# Patient Record
Sex: Female | Born: 1935
Health system: Southern US, Community
[De-identification: ages and names within clinical notes are randomized; demographics above are authoritative.]

## PROBLEM LIST (undated history)

## (undated) DIAGNOSIS — K56609 Unspecified intestinal obstruction, unspecified as to partial versus complete obstruction: Secondary | ICD-10-CM

## (undated) DIAGNOSIS — G8929 Other chronic pain: Secondary | ICD-10-CM

## (undated) DIAGNOSIS — R51 Headache: Secondary | ICD-10-CM

## (undated) DIAGNOSIS — I639 Cerebral infarction, unspecified: Secondary | ICD-10-CM

## (undated) DIAGNOSIS — B029 Zoster without complications: Secondary | ICD-10-CM

## (undated) DIAGNOSIS — Z9289 Personal history of other medical treatment: Secondary | ICD-10-CM

## (undated) DIAGNOSIS — E119 Type 2 diabetes mellitus without complications: Secondary | ICD-10-CM

## (undated) DIAGNOSIS — I10 Essential (primary) hypertension: Secondary | ICD-10-CM

## (undated) DIAGNOSIS — M545 Low back pain, unspecified: Secondary | ICD-10-CM

## (undated) DIAGNOSIS — F32A Depression, unspecified: Secondary | ICD-10-CM

## (undated) DIAGNOSIS — IMO0002 Reserved for concepts with insufficient information to code with codable children: Secondary | ICD-10-CM

## (undated) DIAGNOSIS — M1990 Unspecified osteoarthritis, unspecified site: Secondary | ICD-10-CM

## (undated) DIAGNOSIS — I2510 Atherosclerotic heart disease of native coronary artery without angina pectoris: Secondary | ICD-10-CM

## (undated) DIAGNOSIS — T4145XA Adverse effect of unspecified anesthetic, initial encounter: Secondary | ICD-10-CM

## (undated) DIAGNOSIS — F329 Major depressive disorder, single episode, unspecified: Secondary | ICD-10-CM

## (undated) DIAGNOSIS — H3530 Unspecified macular degeneration: Secondary | ICD-10-CM

## (undated) DIAGNOSIS — F419 Anxiety disorder, unspecified: Secondary | ICD-10-CM

## (undated) DIAGNOSIS — D649 Anemia, unspecified: Secondary | ICD-10-CM

## (undated) DIAGNOSIS — K219 Gastro-esophageal reflux disease without esophagitis: Secondary | ICD-10-CM

## (undated) DIAGNOSIS — R519 Headache, unspecified: Secondary | ICD-10-CM

## (undated) DIAGNOSIS — K222 Esophageal obstruction: Secondary | ICD-10-CM

## (undated) DIAGNOSIS — E7800 Pure hypercholesterolemia, unspecified: Secondary | ICD-10-CM

## (undated) HISTORY — PX: EYE SURGERY: SHX253

## (undated) HISTORY — PX: SQUAMOUS CELL CARCINOMA EXCISION: SHX2433

## (undated) HISTORY — PX: COLON SURGERY: SHX602

## (undated) HISTORY — PX: CHOLECYSTECTOMY: SHX55

## (undated) HISTORY — PX: BACK SURGERY: SHX140

## (undated) HISTORY — PX: ABDOMINAL HYSTERECTOMY: SHX81

## (undated) HISTORY — PX: CATARACT EXTRACTION, BILATERAL: SHX1313

## (undated) HISTORY — PX: APPENDECTOMY: SHX54

## (undated) HISTORY — PX: ESOPHAGOGASTRODUODENOSCOPY (EGD) WITH ESOPHAGEAL DILATION: SHX5812

## (undated) HISTORY — PX: COLONOSCOPY: SHX174

## (undated) HISTORY — PX: TOTAL KNEE ARTHROPLASTY: SHX125

## (undated) HISTORY — PX: FRACTURE SURGERY: SHX138

## (undated) HISTORY — PX: JOINT REPLACEMENT: SHX530

## (undated) HISTORY — DX: Type 2 diabetes mellitus without complications: E11.9

---

## 1965-08-05 DIAGNOSIS — I639 Cerebral infarction, unspecified: Secondary | ICD-10-CM

## 1965-08-05 HISTORY — DX: Cerebral infarction, unspecified: I63.9

## 2001-07-06 ENCOUNTER — Ambulatory Visit (HOSPITAL_COMMUNITY): Admission: RE | Admit: 2001-07-06 | Discharge: 2001-07-06 | Payer: Self-pay | Admitting: Gastroenterology

## 2001-08-06 ENCOUNTER — Encounter: Payer: Self-pay | Admitting: Orthopedic Surgery

## 2001-08-14 ENCOUNTER — Encounter: Payer: Self-pay | Admitting: Orthopedic Surgery

## 2001-08-14 ENCOUNTER — Inpatient Hospital Stay (HOSPITAL_COMMUNITY): Admission: RE | Admit: 2001-08-14 | Discharge: 2001-08-18 | Payer: Self-pay | Admitting: Orthopedic Surgery

## 2004-01-23 ENCOUNTER — Encounter: Admission: RE | Admit: 2004-01-23 | Discharge: 2004-01-23 | Payer: Self-pay | Admitting: Family Medicine

## 2008-12-16 ENCOUNTER — Inpatient Hospital Stay (HOSPITAL_COMMUNITY): Admission: RE | Admit: 2008-12-16 | Discharge: 2008-12-21 | Payer: Self-pay | Admitting: Orthopedic Surgery

## 2009-08-05 DIAGNOSIS — T8859XA Other complications of anesthesia, initial encounter: Secondary | ICD-10-CM

## 2009-08-05 HISTORY — PX: SMALL INTESTINE SURGERY: SHX150

## 2009-08-05 HISTORY — DX: Other complications of anesthesia, initial encounter: T88.59XA

## 2010-11-13 LAB — BASIC METABOLIC PANEL
BUN: 17 mg/dL (ref 6–23)
BUN: 13 mg/dL (ref 6–23)
BUN: 23 mg/dL (ref 6–23)
CO2: 26 mEq/L (ref 19–32)
CO2: 24 mEq/L (ref 19–32)
Calcium: 8.1 mg/dL — ABNORMAL LOW (ref 8.4–10.5)
Calcium: 8.3 mg/dL — ABNORMAL LOW (ref 8.4–10.5)
Calcium: 8.8 mg/dL (ref 8.4–10.5)
Chloride: 101 mEq/L (ref 96–112)
Chloride: 102 mEq/L (ref 96–112)
Chloride: 104 mEq/L (ref 96–112)
Creatinine, Ser: 0.99 mg/dL (ref 0.4–1.2)
Creatinine, Ser: 1 mg/dL (ref 0.4–1.2)
Creatinine, Ser: 0.93 mg/dL (ref 0.4–1.2)
Creatinine, Ser: 1.28 mg/dL — ABNORMAL HIGH (ref 0.4–1.2)
GFR calc Af Amer: 56 mL/min — ABNORMAL LOW (ref >60)
GFR calc Af Amer: >60 mL/min (ref >60)
GFR calc Af Amer: >60 mL/min (ref >60)
GFR calc non Af Amer: 46 mL/min — ABNORMAL LOW (ref >60)
GFR calc non Af Amer: 54 mL/min — ABNORMAL LOW (ref >60)
GFR calc non Af Amer: 59 mL/min — ABNORMAL LOW (ref >60)
Glucose, Bld: 124 mg/dL — ABNORMAL HIGH (ref 70–99)
Glucose, Bld: 124 mg/dL — ABNORMAL HIGH (ref 70–99)
Potassium: 4.2 mEq/L (ref 3.5–5.1)
Sodium: 135 mEq/L (ref 135–145)
Sodium: 136 mEq/L (ref 135–145)

## 2010-11-13 LAB — PROTIME-INR
INR: 3.4 — ABNORMAL HIGH (ref 0.00–1.49)
INR: 5 — ABNORMAL HIGH (ref 0.00–1.49)
INR: 1 (ref 0.00–1.49)
Prothrombin Time: 31.7 seconds — ABNORMAL HIGH (ref 11.6–15.2)
Prothrombin Time: 51 seconds — ABNORMAL HIGH (ref 11.6–15.2)
Prothrombin Time: 15.5 seconds — ABNORMAL HIGH (ref 11.6–15.2)

## 2010-11-13 LAB — TYPE AND SCREEN
ABO/RH(D): O POS
ABO/RH(D): O POS
Antibody Screen: NEGATIVE
Antibody Screen: NEGATIVE

## 2010-11-13 LAB — URINALYSIS, ROUTINE W REFLEX MICROSCOPIC
Glucose, UA: NEGATIVE mg/dL
Hgb urine dipstick: NEGATIVE
Specific Gravity, Urine: 1.027 (ref 1.005–1.030)
Urobilinogen, UA: 0.2 mg/dL (ref 0.0–1.0)

## 2010-11-13 LAB — CBC
HCT: 24.4 % — ABNORMAL LOW (ref 36.0–46.0)
HCT: 34.5 % — ABNORMAL LOW (ref 36.0–46.0)
Hemoglobin: 8.8 g/dL — ABNORMAL LOW (ref 12.0–15.0)
MCHC: 33.5 g/dL (ref 30.0–36.0)
MCV: 89.6 fL (ref 78.0–100.0)
Platelets: 300 10*3/uL (ref 150–400)
Platelets: 273 10*3/uL (ref 150–400)
Platelets: 387 10*3/uL (ref 150–400)
RBC: 2.56 MIL/uL — ABNORMAL LOW (ref 3.87–5.11)
RBC: 3.85 MIL/uL — ABNORMAL LOW (ref 3.87–5.11)
RDW: 15 % (ref 11.5–15.5)
RDW: 14.9 % (ref 11.5–15.5)
WBC: 13.9 10*3/uL — ABNORMAL HIGH (ref 4.0–10.5)
WBC: 7.6 10*3/uL (ref 4.0–10.5)

## 2010-11-13 LAB — ABO/RH: ABO/RH(D): O POS

## 2010-11-13 LAB — DIFFERENTIAL
Basophils Relative: 0 % (ref 0–1)
Eosinophils Absolute: 0.2 10*3/uL (ref 0.0–0.7)
Eosinophils Relative: 2 % (ref 0–5)
Monocytes Relative: 8 % (ref 3–12)
Neutrophils Relative %: 62 % (ref 43–77)

## 2010-11-13 LAB — PREPARE RBC (CROSSMATCH)

## 2010-11-13 LAB — HEMOGLOBIN AND HEMATOCRIT, BLOOD: HCT: 29.4 % — ABNORMAL LOW (ref 36.0–46.0)

## 2010-12-18 NOTE — Discharge Summary (Signed)
NAMEMerci, [name]                ACCOUNT NO.:  [id number]   MEDICAL RECORD NO.:  [id number]          PATIENT TYPE:  INP   LOCATION:  5023                         FACILITY:  [facility]   PHYSICIAN:  Doran Heater. Veverly Fells, M.D. DATE OF BIRTH:  March 04, 1932   DATE OF ADMISSION:  12/16/2008  DATE OF DISCHARGE:                               DISCHARGE SUMMARY   ADMISSION DIAGNOSIS:  Right knee end-stage osteoarthritis.   DISCHARGE DIAGNOSES:  Right knee end-stage osteoarthritis, blood loss  anemia improved after transfusion.   BRIEF HISTORY:  The patient is a 77 year old female with worsening right  knee pain secondary to osteoarthritis.  The patient has elected to have  a right total knee arthroplasty by Dr. Esmond Plants.   PROCEDURE:  The patient had a right total knee replacement done by Dr.  Esmond Plants on Dec 16, 2008.  Assistant was [employer], PA-C.  Estimated blood loss was minimal.  She did have general anesthesia.  No  complications.   HOSPITAL COURSE:  The patient admitted on Dec 16, 2008, for the above-  stated procedure which she tolerated well.  After adequate time in  postanesthesia care unit, she was transferred up to 5000.  On postop day  1, the patient complained of moderate pain in the right knee but was  able to work with Physical Therapy gently.  She did have some mild drop  in her H and H.  On postop day 3, she dropped all the way down to 7.8.  She was transfused 2 units of packed red blood cells.  The patient  states she was feeling a little bit better.  On postop day 4, we wanted  to go ahead and have one more day to work on strengthening and also  physical therapy, and this patient was discharged home on postop day 5.  Wound was healing well.  No signs of drainage or erythema.  Neurovascularly, she was intact.  Basically and overall,  she is doing  fairly well.   DISCHARGE PLAN:  The patient will be discharged home on Dec 21, 2008.  The patient has allergies to  SULFA.   DISCHARGE MEDICATIONS:  1. Celexa 20 mg p.o. daily.  2. Robaxin 500 mg p.o. q.6 hours.  3. Procardia 120 mg p.o. daily.  4. Benicar 40 mg p.o. daily.  5. Protonix 80 mg p.o. daily.  6. Zocor 20 mg daily.  7. Coumadin per pharmacy protocol.  8. Xanax 0.25 mg p.o. daily p.r.n.  9. Flexeril 10 mg p.o. q.i.d. p.r.n.  10.Norco 5/325 one to two tabs q.4-6 hours p.r.n. pain.  11.Percocet 5/325 one to two tabs q.4-6 hours p.r.n. pain.   FOLLOWUP:  The patient will follow back up with Dr. Esmond Plants in 2  weeks.  Her condition is stable.  Her diet is regular.       Thomas B. Doren Custard, P.A.      Doran Heater. Veverly Fells, M.D.  Electronically Signed    TBD/MEDQ  D:  12/20/2008  T:  12/21/2008  Job:  [id number]

## 2010-12-18 NOTE — Op Note (Signed)
NAMEPanayiota, [name]                ACCOUNT NO.:  [id number]   MEDICAL RECORD NO.:  [id number]          PATIENT TYPE:  INP   LOCATION:  5023                         FACILITY:  [facility]   PHYSICIAN:  Doran Heater. Veverly Fells, M.D. DATE OF BIRTH:  13-Aug-1931   DATE OF PROCEDURE:  12/16/2008  DATE OF DISCHARGE:                               OPERATIVE REPORT   PREOPERATIVE DIAGNOSIS:  Right knee end-stage arthritis.   POSTOPERATIVE DIAGNOSIS:  Right knee end-stage arthritis.   PROCEDURE PERFORMED:  Right total knee replacement using DePuy Sigma  rotating platform prosthesis.   ATTENDING SURGEON:  Doran Heater. Veverly Fells, MD   ASSISTANT:  Abbott Pao. Dixon, PA.   ANESTHESIA:  General anesthesia was used.   ESTIMATED BLOOD LOSS:  Minimal.   FLUID REPLACEMENT:  1200 mL crystalloid.   URINE OUTPUT:  500 mL.   TOURNIQUET TIME:  1 hour 20 minutes at 300 mmHg.   INSTRUMENT COUNTS:  Correct.   COMPLICATIONS:  There were no complications.   Perioperative antibiotics were given.   INDICATIONS:  The patient is a 77 year old female with a history of  worsening right knee pain secondary to arthritis.  The patient has had  progressive pain despite conservative management.  She has had a prior  left total knee and has done well with that.  She presents now for  operative treatment for her arthritic right knee.  Informed consent was  obtained.   DESCRIPTION OF PROCEDURE:  After an adequate level of anesthesia was  achieved, the patient was positioned supine on the operating table.  Right leg was correctly identified and nonsterile tourniquet was placed  on the right proximal thigh.  The right leg was sterilely prepped and  draped in the usual manner.  We went ahead and exsanguinated the limb  using an Esmarch bandage, elevated the tourniquet to 300 mmHg.  Longitudinal midline incision was created with knee in flexion,  dissection down through subcutaneous tissues.  A medial parapatellar  arthrotomy was  created.  The patella was everted.  We then entered the  distal femur using a step-cut drill.  Distal femoral resections set at  11 mm because of the slight flexion contracture with 5 degrees right  setting.  We then performed 4-in-1 anterior, posterior, and chamfer cuts  after sizing the femur to a 2.5.  We then subluxed the tibia forward,  divided ACL, PCL and meniscal tissues.  We then cut tibia at 90 degrees  perpendicular to long axis of tibia with minimal posterior slope.  We  checked her gaps, which were symmetric at 10 mm.  It was a little bit  tight getting the pin in, despite the medial release and posterior  capsule release.  So we went ahead and resected 2 more millimeters of  tibia, and we were symmetric then at 12.5 both in flexion and extension.  At this point, we went ahead and prepared the tibia, sizing to a 2.5 and  performing modular drill and keel punch and then also performing a box  cut in the femur, placing the trial components in place and reducing  with 10 insert with nice soft tissue balancing.  We then resurfaced the  patella starting at 22 thickness and going down to 40 mm of thickness  for the size 32 patella.  We drilled our lugs for the patella, placed a  patellar button on and then arranged the knee with good soft tissue  balancing patellar tracking.  At this point, we removed all trial  components.  We thoroughly pulse irrigated the knee, plugged the distal  femur with available bone and then cemented the components into place  using DePuy SmartSet cement.  Once the cement was hardened, we removed  excess cement using 1/4 inch curved osteotome.  We then trialed again  with 10, tried the 12.5 was too tight, the 10 was selected.  We removed  the trial component, placed real component in place and then went ahead  and closed the knee with knee in flexion using #1 Vicryl suture  interrupted figure-of-eight for parapatellar arthrotomy closure,  followed by  layered closure with 0 and 2-0 Vicryl for the subcutaneous  tissues and running 4-0 Monocryl for skin.  Steri-Strips applied,  followed by sterile dressing.  The patient tolerated the surgery well.      Doran Heater. Veverly Fells, M.D.  Electronically Signed     SRN/MEDQ  D:  12/16/2008  T:  12/17/2008  Job:  [id number]

## 2010-12-18 NOTE — H&P (Signed)
NAME:  [name], [name]                ACCOUNT NO.:  [id number]   MEDICAL RECORD NO.:  [id number]          PATIENT TYPE:  INP   LOCATION:                               FACILITY:  [facility]   PHYSICIAN:  Doran Heater. Veverly Fells, M.D. DATE OF BIRTH:  03-05-1932   DATE OF ADMISSION:  12/20/2008  DATE OF DISCHARGE:                              HISTORY & PHYSICAL   CHIEF COMPLAINTS:  Right knee pain.   HISTORY OF PRESENT ILLNESS:  The patient is a 77 year old female with  worsening right knee pain, has been refractory to conservative treatment  secondary to osteoarthritis.  The patient is elected to have a right  total knee arthroplasty by Dr. Esmond Plants.   PAST MEDICAL HISTORY:  1. Hypertension.  2. GERD.   FAMILY MEDICAL HISTORY:  CVA.   SOCIAL HISTORY:  Patient of [name], does not smoke or use  alcohol.   DRUG ALLERGIES:  SULFA.   CURRENT MEDICATIONS:  1. Procardia 120 mg p.o. daily.  2. Benicar 40 mg p.o. daily.   REVIEW OF SYSTEMS:  Pain with ambulation.   PHYSICAL EXAMINATION:  VITAL SIGNS:  Pulse 70, respirations 16, blood  pressure 130/68.  GENERAL:  The patient is a healthy-appearing 77 year old female, in no  acute distress, pleasant mood and affect, alert and oriented x3.  HEAD AND NECK:  Cranial nerves II through XII grossly intact.  Neck  shows full range of motion without any tenderness.  CHEST:  Active breath sounds bilaterally.  No wheeze, rhonchi, or rales  HEART:  Regular rate and rhythm.  No murmur.  ABDOMEN:  Nontender, nondistended with active bowel sounds.  EXTREMITIES:  Moderate tenderness in the right knee with range of  motion.  SKIN:  No edema or no rashes.   X-rays show right end-stage osteoarthritis.   IMPRESSION:  Right knee end-stage osteoarthritis.   PLAN OF ACTION:  Right total knee arthroplasty by Dr. Esmond Plants.      Thomas B. Doren Custard, P.A.      Doran Heater. Veverly Fells, M.D.  Electronically Signed    TBD/MEDQ  D:  12/08/2008  T:   12/09/2008  Job:  [id number]

## 2010-12-21 NOTE — Procedures (Signed)
[place]  Patient:    JACQULYNE, GLADUE Visit Number: [id number] MRN: [id number]          Service Type: END Location: ENDO Attending Physician:  Sherrin Daisy Dictated by:   Joyice Faster. Oletta Lamas, M.D. Proc. Date: 07/06/01 Admit Date:  07/06/2001   CC:         Micheline Chapman, M.D.  [phone]   Procedure Report  PROCEDURE PERFORMED:  Colonoscopy.  ENDOSCOPIST:  Joyice Faster. Oletta Lamas, M.D.  MEDICATIONS USED:  Fentanyl 170 mcg, Versed 2 mg IV.  INSTRUMENT:  Olympus pediatric video colonoscope.  INDICATIONS:  Colon cancer evaluation in a woman with a strong family history of colon cancer.  Her mother died of colon cancer.  DESCRIPTION OF PROCEDURE:  The procedure had been explained to the patient and consent obtained.  With the patient in the left lateral decubitus position, the Olympus pediatric video colonoscope was inserted and advanced under direct visualization.  The prep was excellent and we were able to advance to the cecum using abdominal pressure and position changes.  The ileocecal valve and appendiceal orifice were seen.  The scope was withdrawn.  The cecum, ascending colon, hepatic flexure, transverse colon, splenic flexure, descending and sigmoid colon were seen well.  No polyps were seen.  Extensive diverticular disease in the sigmoid colon.  Scope withdrawn, patient tolerated the procedure well.  Maintained on low flow oxygen and pulse oximeter throughout the procedure.  ASSESSMENT: 1. No evidence of colon polyps. 2. Extensive diverticular disease.  PLAN:  Due to her strong family history, will recommend repeating this in five years and recommend yearly  hemoccults. Dictated by:   Joyice Faster. Oletta Lamas, M.D. Attending Physician:  Sherrin Daisy DD:  07/06/01 TD:  07/06/01 Job: [id number]

## 2010-12-21 NOTE — Op Note (Signed)
[facility]  Patient:    Joy, Erickson Visit Number: [id number] MRN: [id number]          Service Type: SUR Location: 4W 0470 01 Attending Physician:  Augustin Schooling. Dictated by:   Esmond Plants, M.D. Proc. Date: 08/14/01 Admit Date:  08/14/2001   CC:         Metta Clines. Supple, M.D.   Operative Report  PREOPERATIVE DIAGNOSIS:  Left knee osteoarthritis.  POSTOPERATIVE DIAGNOSIS:  Left knee osteoarthritis.  PROCEDURE:  Left total knee arthroplasty.  SURGEON:  Esmond Plants, M.D.  FIRST ASSISTANT:  Metta Clines. Supple, M.D.  ANESTHESIA:  Spinal plus MAC.  ESTIMATED BLOOD LOSS:  Minimal.  TOURNIQUET TIME:  108 minutes.  FLUID REPLACEMENT:  1200 cc crystalloid.  URINE OUTPUT:  250 cc.  INSTRUMENT COUNT:  Correct.  COMPLICATIONS:  None.  Perioperative antibiotics were given.  INDICATIONS FOR PROCEDURE:  The patient is a 77 year old female who presents complaining of worsening left knee pain. The patient has medial compartment osteoarthrosis on standing x-rays. The patient recently notes dramatic increase in her pain to where she is having pain at night as well as pain at rest. She has had good temporary relief with intra-articular injection of local anesthetic and cortisone; however, the last injection did not work very long at all. The patients modified her activities as best as she can and now desires a total knee replacement. Informed consent was obtained.  DESCRIPTION OF PROCEDURE:  After an adequate level of spinal anesthesia plus MAC was achieved and 1 gm of Ancef was given preoperatively, the patient was positioned supine on the operating room table. The right nonoperative leg was secured to the table and padded appropriately. The left leg had a nonsterile tourniquet placed on the thigh and then was prepped and draped in its entirety in the usual sterile fashion. After exsanguination of the limb and elevation of the  tourniquet to 275 mmHg, a midline incision was created utilizing a 10 blade scalpel, dissection was carried sharply down through the subcutaneous tissues. A median parapatellar arthrotomy was then performed using a 10 blade scalpel. The patella was then everted and the femoral canal was then opened and a femoral jig was placed, 10 mm of distal femur removed. The femur was then sized to size 7, anterior posterior cuts as well as chamfer cuts were then performed using an oscillating saw. ACL and menisci removed from the tibia as well as the PCL. The tibia was then subluxed forward and an internal intermedullary alignment guide was used for the tibia, 4 mm of bone was taken off of the affected side. Care was taken towards ensuring appropriate alignment as verified by external alignment rods. The tibia was then cut using an oscillating saw. The patella was resurfaced by removing 10 mm of patella.  At this point, the box cut jig was then placed on the femur to provide for a posterior stabilized component and the keel was punched in the tibia once rotation was ensured after trialing and again checking with the alignment rod. The knee was thoroughly irrigated. A size 7 femur, size 7 tibial tray were then selected and cemented into place. A 10 mm trial insert was then placed and the knee was reduced and held during the curing process of the cement. This was an [employer]. In addition to the patella, a 10 mm medial offset patellar button was then cemented into place and held with a patellar clamp. Once the cement on  the back table was thoroughly cured, the trial component of the knee was taken through a trial range of motion. There was noted to be no tilt or subluxation of the patella with no fingers. At this point, the trial 10 mm insert was removed and a 10 mm posterior stabilized polyethylene insert was placed. The knee was noted to be very stable and had full extension flexion to 130  degrees on the table. The knee was thoroughly irrigated. A drain was placed through the suprapatellar pouch and out the lateral thigh. The medial parapatellar arthrotomy was closed using interrupted figure-of-eight #1 PDS suture. Zero and 2-0 Vicryl was used to close the subcutaneous tissues and running 4-0 monocryl was used to close the skin. Steri-Strips were applied followed by a sterile dressing and a knee immobilizer. The patient tolerated the procedure well and was taken to PACU in stable condition. Dictated by:   Esmond Plants, M.D. Attending Physician:  Esmond Plants R. DD:  08/15/01 TD:  08/16/01 Job: [id number]

## 2010-12-21 NOTE — Discharge Summary (Signed)
[facility]  Patient:    [name], [name] Visit Number: [id number] MRN: [id number]          Service Type: SUR Location: 4W 0470 01 Attending Physician:  Augustin Schooling. Dictated by:   Peter Congo, P.A. Admit Date:  08/14/2001 Discharge Date: 08/18/2001                             Discharge Summary  ADMISSION DIAGNOSES: 1. Osteoarthritis to the left knee. 2. Hypertension. 3. Remote history of cerebrovascular accident.  DISCHARGE DIAGNOSES: 1. Osteoarthritis, left knee, status post total knee arthroplasty. 2. Hypertension. 3. Remote history of cerebrovascular accident.  PROCEDURE:  On August 14, 2001, the patient underwent a left total knee arthroplasty.  Surgeon was [employer]. Veverly Fells, M.D., assistant Metta Clines. Supple, M.D.  COMPLICATIONS:  None.  CONSULTATIONS:  Physical medicine and rehabilitation.  HISTORY OF PRESENT ILLNESS:  This is an 77 year old female with a long history of left knee pain for about six to seven years.  She had knee scoped about three years ago and has had continued pain that has been progressive.  She has had a series of cortisone injections, Synvisc injections, and various anti-inflammatories without relief.  She was also noted prior to the surgery to be having nighttime pain, also pain and stiffness with attempts activity. Because of significant findings on her radiographic exam and interference with her activities of daily living, she elected to undergo surgical intervention.  HOSPITAL COURSE:  The patient had the above-stated surgery on August 14, 9526, without complications.  While in the operating room, a Hemovac drain was placed inside the incision.  This was discontinued on postop day #1 without difficulty.  The incisions were dressed in a sterile fashion while in the operating room.  These were clean, dry, and intact on postop day #1.  The dressings were changed on postop day #2 and daily thereafter.   She was free of any erythema or drainage from the incision site.  She was placed on Coumadin for DVT prophylaxis, which was monitored and dosed by the pharmacy throughout her hospital stay.  A regular diet was advanced postoperatively.  She tolerated p.o. intake well throughout her hospital stay and did have bowel movements.  PCA morphine was used initially.  This was weaned off by postop day #2, and she used p.o. Percocet throughout the remainder of her hospital stay.  Her home medications of Procardia, Zestoretic, and Vioxx were resumed postoperatively.  She utilized SCDs and knee-high TED hose for DVT prophylaxis as well.  Occupational and physical therapy worked with the patient for ambulation.  She was weightbearing as tolerated to the left lower extremity. She was further progressed with her flexion-extension so that on postop day #3 a CPM was utilized.  Hemoglobin and hematocrit were checked daily for three days and found to be stable.  She was placed on Trinsicon.  A rehab consult was obtained; however, she did progress well in physical therapy and was felt to be comfortable for home health PT.  On August 18, 2001, she was felt to be medically and orthopedically stable for discharge.  LABORATORY DATA:  H&H on August 06, 2001, were within normal limits.  On August 16, 2001, hemoglobin 8.0, hematocrit 22.9.  On August 17, 2001, hemoglobin was up to 8.6, hematocrit 24.9.  Coagulation studies on August 06, 2001, were within normal limits.  On August 16, 2001, PT was 19.3, INR 1.9.  On August 18, 2000, PT was 21.9, INR 2.4.  BMET from August 06, 2001, showed some mild hypokalemia at 3.4, otherwise normal.  On August 15, 2001, BMET was within normal limits other than glucose of 142.  Calcium was 8.1.  Liver function studies on August 06, 2001 were within normal limits.  UA negative on August 06, 2001.  Blood type on August 14, 2001 is O positive.  CARDIOLOGY:  Tracings from  August 06, 2001, revealed normal sinus rhythm, were confirmed by Dr. Alla German.  RADIOLOGY:  Two-view chest x-ray from August 06, 2001, revealed normal chest x-ray.  Two-view knee x-ray from August 14, 2000, was status post left total knee replacement without complicating feature.  CONDITION ON DISCHARGE:  Improved.  DISCHARGE PLANS AND MEDICATIONS:  The patient was discharged home with home health PT and OT, and Coumadin therapy with [facility].  She was to utilize her CPM for about four hours a day.  Given prescriptions for Percocet, Robaxin, Trinsicon, and Coumadin per pharmacy.  Resume her home medications except aspirin.  Follow up with Dr. Veverly Fells in two weeks from the surgery date.  Weightbearing as tolerated to the left lower extremity.  Daily dressing changes.  Shower postop day #5.  Low-sodium diet.  Call should she have any questions or concerns prior to her follow-up appointment with Dr. Veverly Fells. Dictated by:   Peter Congo, P.A. Attending Physician:  Esmond Plants R. DD:  08/31/01 TD:  09/01/01 Job: [id number]

## 2010-12-21 NOTE — H&P (Signed)
[facility]  Patient:    [name] Visit Number: [id number] MRN: [id number]          Service Type: Attending:  Doran Heater. Veverly Fells, M.D. Dictated by:   Duncan Dull Troncale, P.A.C. Adm. Date:  08/14/01                           History and Physical  DATE OF BIRTH:  07-08-1932. SOCIAL SECURITY NO. [id number].  CHIEF COMPLAINT:  Left knee pain.  HISTORY OF PRESENT ILLNESS:  [name] is a 77 year old female who presents with severe left knee pain that she has had for the last six to seven years. She had previously undergone a knee scope about three years ago without much relief.  She has tried a series of cortisone injections, Synvisc injections, and tried various anti-inflammatories without relief.  She is now having nighttime pain, also having pain and stiffness with any attempts at activity. Because of the significant findings on her radiographic exams and interference with her activities of daily living, she has elected to undergo surgical intervention.  REVIEW OF SYSTEMS:  She denies any recent fevers or chills.  No diplopia, blurred vision, or headaches.  No rhinorrhea, sore throat, or earache.  No chest pain, shortness of breath, or cough.  No abdominal pain, nausea, vomiting, diarrhea, or constipation.  No melena or bright red blood per rectum.  No urinary frequency, dysuria, or hematuria.  No numbness or tingling in her extremities.  PAST MEDICAL HISTORY:  Significant for hypertension, also a stroke with the birth of her daughter in 61 with no residual effects.  She had a clear colonoscopy in December 2002.  Only other medical problem is she had anemia and a thyroid problem when young, but this is resolved.  No history of diabetes, seizures, cancer, heart, lung, or kidney disease.  PAST SURGICAL HISTORY:  Left knee arthroscopy in 1999, hysterectomy in 1970, and appendectomy in 1955.  ALLERGIES:  SULFA.  MEDICATIONS:  Procardia  LA 60 mg q.d., Zestoretic (lisinopril/ hydrochlorothiazide) 25 mg q.d., Vioxx, and vitamin.  FAMILY HISTORY:  Father had a stroke.  Mother had heart disease and colon cancer.  SOCIAL HISTORY:  She is a nonsmoker.  No alcohol use.  She is retired.  She is married.  Her daughter lives on the same property as the patient.  She has two steps leading into her home and lives in a single-level home.  Dr. Jari Pigg is her medical doctor.  PHYSICAL EXAMINATION:  VITAL SIGNS:  She is afebrile.  Her pulse is 72, respiratory rate is 10, blood pressure 144/84.  GENERAL:  A well-developed, well-nourished female in no acute distress, although she walks with an antalgic gait.  HEENT:  Head is atraumatic and normocephalic.  Pupils are equal, round and reactive to light, and extraocular movements are grossly intact.  Oropharynx is clear without redness, exudate, or lesions.  NECK:  Supple with no cervical lymphadenopathy.  CHEST:  Clear to auscultation bilaterally with no wheezes or crackles.  CARDIAC:  Regular rate and rhythm with no murmurs, rubs, or gallops.  ABDOMEN:  Soft, nontender, nondistended, with no masses, no hepatosplenomegaly.  BREASTS, GENITOURINARY:  Not examined, as not pertinent to present illness.  EXTREMITIES:  Slight varus alignment of the left knee.  She is tender diffusely over the medial and lateral joint spaces.  Range of motion is 0-120 degrees.  Distal neurovascular exam is grossly intact.  DIAGNOSTIC  STUDIES:  Radiographic studies demonstrate severe medial compartment osteoarthritis of the left knee.  IMPRESSION: 1. Left knee osteoarthritis. 2. Hypertension. 3. Remote history of cerebrovascular accident.  PLAN:  Patient will be admitted to [facility] to undergo a left total knee arthroplasty by Dr. Veverly Fells on August 14, 2001.  Preoperative labs and signed surgical consents will be obtained.  All questions have been encouraged and answered for  the patient. Dictated by:   Duncan Dull Troncale, P.A.C. Attending:  Doran Heater. Veverly Fells, M.D. DD:  08/06/01 TD:  08/06/01 Job: [id number]

## 2011-08-06 DIAGNOSIS — IMO0002 Reserved for concepts with insufficient information to code with codable children: Secondary | ICD-10-CM

## 2011-08-06 HISTORY — DX: Reserved for concepts with insufficient information to code with codable children: IMO0002

## 2012-03-30 ENCOUNTER — Emergency Department (HOSPITAL_COMMUNITY): Payer: Medicare Other

## 2012-03-30 ENCOUNTER — Encounter (HOSPITAL_COMMUNITY): Payer: Self-pay | Admitting: Emergency Medicine

## 2012-03-30 ENCOUNTER — Inpatient Hospital Stay (HOSPITAL_COMMUNITY)
Admission: EM | Admit: 2012-03-30 | Discharge: 2012-04-08 | DRG: 480 | Disposition: A | Discharge status: Skilled Nursing Facility | Payer: Medicare Other | Attending: Family Medicine | Admitting: Family Medicine

## 2012-03-30 DIAGNOSIS — N390 Urinary tract infection, site not specified: Secondary | ICD-10-CM

## 2012-03-30 DIAGNOSIS — S7292XA Unspecified fracture of left femur, initial encounter for closed fracture: Secondary | ICD-10-CM | POA: Diagnosis present

## 2012-03-30 DIAGNOSIS — B964 Proteus (mirabilis) (morganii) as the cause of diseases classified elsewhere: Secondary | ICD-10-CM | POA: Diagnosis present

## 2012-03-30 DIAGNOSIS — W19XXXA Unspecified fall, initial encounter: Secondary | ICD-10-CM

## 2012-03-30 DIAGNOSIS — J189 Pneumonia, unspecified organism: Secondary | ICD-10-CM | POA: Diagnosis not present

## 2012-03-30 DIAGNOSIS — R509 Fever, unspecified: Secondary | ICD-10-CM | POA: Diagnosis not present

## 2012-03-30 DIAGNOSIS — Z96659 Presence of unspecified artificial knee joint: Secondary | ICD-10-CM

## 2012-03-30 DIAGNOSIS — W010XXA Fall on same level from slipping, tripping and stumbling without subsequent striking against object, initial encounter: Secondary | ICD-10-CM | POA: Diagnosis present

## 2012-03-30 DIAGNOSIS — D649 Anemia, unspecified: Secondary | ICD-10-CM

## 2012-03-30 DIAGNOSIS — Y92009 Unspecified place in unspecified non-institutional (private) residence as the place of occurrence of the external cause: Secondary | ICD-10-CM

## 2012-03-30 DIAGNOSIS — R339 Retention of urine, unspecified: Secondary | ICD-10-CM | POA: Diagnosis present

## 2012-03-30 DIAGNOSIS — N189 Chronic kidney disease, unspecified: Secondary | ICD-10-CM | POA: Diagnosis present

## 2012-03-30 DIAGNOSIS — D631 Anemia in chronic kidney disease: Secondary | ICD-10-CM | POA: Diagnosis present

## 2012-03-30 DIAGNOSIS — A498 Other bacterial infections of unspecified site: Secondary | ICD-10-CM | POA: Diagnosis present

## 2012-03-30 DIAGNOSIS — K219 Gastro-esophageal reflux disease without esophagitis: Secondary | ICD-10-CM | POA: Diagnosis present

## 2012-03-30 DIAGNOSIS — S7290XA Unspecified fracture of unspecified femur, initial encounter for closed fracture: Secondary | ICD-10-CM

## 2012-03-30 DIAGNOSIS — B0229 Other postherpetic nervous system involvement: Secondary | ICD-10-CM | POA: Diagnosis present

## 2012-03-30 DIAGNOSIS — Z8619 Personal history of other infectious and parasitic diseases: Secondary | ICD-10-CM | POA: Diagnosis not present

## 2012-03-30 DIAGNOSIS — S7291XA Unspecified fracture of right femur, initial encounter for closed fracture: Secondary | ICD-10-CM | POA: Diagnosis present

## 2012-03-30 DIAGNOSIS — D62 Acute posthemorrhagic anemia: Secondary | ICD-10-CM | POA: Diagnosis not present

## 2012-03-30 DIAGNOSIS — S72409A Unspecified fracture of lower end of unspecified femur, initial encounter for closed fracture: Principal | ICD-10-CM | POA: Diagnosis present

## 2012-03-30 DIAGNOSIS — I10 Essential (primary) hypertension: Secondary | ICD-10-CM | POA: Diagnosis present

## 2012-03-30 HISTORY — DX: Zoster without complications: B02.9

## 2012-03-30 HISTORY — DX: Unspecified intestinal obstruction, unspecified as to partial versus complete obstruction: K56.609

## 2012-03-30 HISTORY — DX: Essential (primary) hypertension: I10

## 2012-03-30 HISTORY — DX: Adverse effect of unspecified anesthetic, initial encounter: T41.45XA

## 2012-03-30 LAB — URINALYSIS, ROUTINE W REFLEX MICROSCOPIC
Bilirubin Urine: NEGATIVE
Glucose, UA: NEGATIVE mg/dL
Hgb urine dipstick: NEGATIVE
Ketones, ur: NEGATIVE mg/dL
Specific Gravity, Urine: 1.024 (ref 1.005–1.030)
pH: 5.5 (ref 5.0–8.0)

## 2012-03-30 LAB — CBC WITH DIFFERENTIAL/PLATELET
Eosinophils Relative: 0 % (ref 0–5)
HCT: 35.1 % — ABNORMAL LOW (ref 36.0–46.0)
Hemoglobin: 11.9 g/dL — ABNORMAL LOW (ref 12.0–15.0)
Lymphocytes Relative: 6 % — ABNORMAL LOW (ref 12–46)
MCHC: 33.9 g/dL (ref 30.0–36.0)
MCV: 88.6 fL (ref 78.0–100.0)
Monocytes Absolute: 0.5 10*3/uL (ref 0.1–1.0)
Monocytes Relative: 3 % (ref 3–12)
Neutro Abs: 16.3 10*3/uL — ABNORMAL HIGH (ref 1.7–7.7)
RDW: 13.5 % (ref 11.5–15.5)
WBC: 17.9 10*3/uL — ABNORMAL HIGH (ref 4.0–10.5)

## 2012-03-30 LAB — BASIC METABOLIC PANEL
BUN: 28 mg/dL — ABNORMAL HIGH (ref 6–23)
Chloride: 101 mEq/L (ref 96–112)
Creatinine, Ser: 0.88 mg/dL (ref 0.50–1.10)
Glucose, Bld: 161 mg/dL — ABNORMAL HIGH (ref 70–99)
Potassium: 4.4 mEq/L (ref 3.5–5.1)

## 2012-03-30 LAB — POCT I-STAT TROPONIN I

## 2012-03-30 MED ORDER — HYDROCODONE-ACETAMINOPHEN 5-325 MG PO TABS
1.0000 | ORAL_TABLET | ORAL | Status: DC | PRN
  Administered 2012-03-31: 2 via ORAL
  Filled 2012-03-30: qty 2

## 2012-03-30 MED ORDER — TIZANIDINE HCL 4 MG PO TABS
4.0000 mg | ORAL_TABLET | Freq: Three times a day (TID) | ORAL | Status: DC | PRN
  Filled 2012-03-30: qty 1

## 2012-03-30 MED ORDER — ENOXAPARIN SODIUM 40 MG/0.4ML ~~LOC~~ SOLN
40.0000 mg | Freq: Once | SUBCUTANEOUS | Status: DC
  Filled 2012-03-30: qty 0.4

## 2012-03-30 MED ORDER — ONDANSETRON HCL 4 MG/2ML IJ SOLN
INTRAMUSCULAR | Status: AC
  Administered 2012-03-30: 4 mg via INTRAVENOUS
  Filled 2012-03-30: qty 2

## 2012-03-30 MED ORDER — TEMAZEPAM 15 MG PO CAPS
30.0000 mg | ORAL_CAPSULE | Freq: Every evening | ORAL | Status: DC | PRN
  Administered 2012-03-31 – 2012-04-07 (×9): 30 mg via ORAL
  Filled 2012-03-30: qty 1
  Filled 2012-03-30 (×2): qty 2
  Filled 2012-03-30: qty 1
  Filled 2012-03-30 (×6): qty 2

## 2012-03-30 MED ORDER — ENOXAPARIN SODIUM 40 MG/0.4ML ~~LOC~~ SOLN
40.0000 mg | Freq: Every day | SUBCUTANEOUS | Status: DC
  Filled 2012-03-30: qty 0.4

## 2012-03-30 MED ORDER — CEFAZOLIN SODIUM-DEXTROSE 2-3 GM-% IV SOLR
2.0000 g | INTRAVENOUS | Status: AC
  Administered 2012-03-31: 2 g via INTRAVENOUS
  Filled 2012-03-30: qty 50

## 2012-03-30 MED ORDER — ONDANSETRON HCL 4 MG/2ML IJ SOLN
4.0000 mg | Freq: Four times a day (QID) | INTRAMUSCULAR | Status: DC | PRN
  Administered 2012-03-30 – 2012-03-31 (×2): 4 mg via INTRAVENOUS
  Filled 2012-03-30 (×2): qty 2

## 2012-03-30 MED ORDER — ACETAMINOPHEN 325 MG PO TABS: 650.0000 mg | ORAL_TABLET | Freq: Four times a day (QID) | ORAL | Status: DC | PRN

## 2012-03-30 MED ORDER — LOSARTAN POTASSIUM 50 MG PO TABS
100.0000 mg | ORAL_TABLET | Freq: Every day | ORAL | Status: DC
  Administered 2012-03-31 – 2012-04-08 (×9): 100 mg via ORAL
  Filled 2012-03-30 (×9): qty 2

## 2012-03-30 MED ORDER — ONDANSETRON HCL 4 MG PO TABS: 4.0000 mg | ORAL_TABLET | Freq: Four times a day (QID) | ORAL | Status: DC | PRN

## 2012-03-30 MED ORDER — CHLORHEXIDINE GLUCONATE 4 % EX LIQD
60.0000 mL | Freq: Once | CUTANEOUS | Status: AC
  Administered 2012-03-31: 4 via TOPICAL
  Filled 2012-03-30: qty 60

## 2012-03-30 MED ORDER — HYDROCHLOROTHIAZIDE 25 MG PO TABS
25.0000 mg | ORAL_TABLET | Freq: Every day | ORAL | Status: DC
Start: 2012-03-31 — End: 2012-04-08
  Administered 2012-03-31 – 2012-04-08 (×9): 25 mg via ORAL
  Filled 2012-03-30 (×9): qty 1

## 2012-03-30 MED ORDER — MORPHINE SULFATE 4 MG/ML IJ SOLN
INTRAMUSCULAR | Status: AC
  Administered 2012-03-30: 4 mg via INTRAVENOUS
  Filled 2012-03-30: qty 1

## 2012-03-30 MED ORDER — MORPHINE SULFATE 4 MG/ML IJ SOLN
3.0000 mg | INTRAMUSCULAR | Status: DC | PRN
  Administered 2012-03-30: 4 mg via INTRAVENOUS
  Filled 2012-03-30: qty 1

## 2012-03-30 MED ORDER — METHOCARBAMOL 500 MG PO TABS: 500.0000 mg | ORAL_TABLET | Freq: Three times a day (TID) | ORAL | Status: DC | PRN

## 2012-03-30 MED ORDER — ALUM & MAG HYDROXIDE-SIMETH 200-200-20 MG/5ML PO SUSP: 30.0000 mL | Freq: Four times a day (QID) | ORAL | Status: DC | PRN

## 2012-03-30 MED ORDER — HYDROMORPHONE HCL PF 2 MG/ML IJ SOLN: 2.0000 mg | Freq: Once | INTRAMUSCULAR | Status: DC

## 2012-03-30 MED ORDER — MORPHINE SULFATE 2 MG/ML IJ SOLN
2.0000 mg | INTRAMUSCULAR | Status: DC | PRN
  Administered 2012-03-31 (×2): 2 mg via INTRAVENOUS
  Filled 2012-03-30 (×2): qty 1

## 2012-03-30 MED ORDER — FENTANYL CITRATE 0.05 MG/ML IJ SOLN
INTRAMUSCULAR | Status: AC
  Administered 2012-03-30: 50 ug
  Filled 2012-03-30: qty 2

## 2012-03-30 MED ORDER — ACETAMINOPHEN 650 MG RE SUPP: 650.0000 mg | Freq: Four times a day (QID) | RECTAL | Status: DC | PRN

## 2012-03-30 MED ORDER — OXYCODONE-ACETAMINOPHEN 5-325 MG PO TABS: 1.0000 | ORAL_TABLET | ORAL | Status: DC | PRN

## 2012-03-30 MED ORDER — HYDROMORPHONE HCL PF 2 MG/ML IJ SOLN
INTRAMUSCULAR | Status: AC
  Administered 2012-03-30: 2 mg
  Filled 2012-03-30: qty 1

## 2012-03-30 MED ORDER — HYDROMORPHONE HCL PF 1 MG/ML IJ SOLN
1.0000 mg | Freq: Once | INTRAMUSCULAR | Status: AC
  Administered 2012-03-30: 1 mg via INTRAVENOUS

## 2012-03-30 MED ORDER — ENOXAPARIN SODIUM 40 MG/0.4ML ~~LOC~~ SOLN
40.0000 mg | Freq: Once | SUBCUTANEOUS | Status: AC
  Administered 2012-03-31: 40 mg via SUBCUTANEOUS
  Filled 2012-03-30: qty 0.4

## 2012-03-30 MED ORDER — PANTOPRAZOLE SODIUM 40 MG PO TBEC
40.0000 mg | DELAYED_RELEASE_TABLET | Freq: Every day | ORAL | Status: DC
  Administered 2012-04-01 – 2012-04-08 (×8): 40 mg via ORAL
  Filled 2012-03-30 (×10): qty 1

## 2012-03-30 MED ORDER — LOSARTAN POTASSIUM-HCTZ 100-25 MG PO TABS: 1.0000 | ORAL_TABLET | Freq: Every day | ORAL | Status: DC

## 2012-03-30 MED ORDER — HYDROMORPHONE HCL PF 1 MG/ML IJ SOLN
1.0000 mg | Freq: Once | INTRAMUSCULAR | Status: AC
  Administered 2012-03-30: 1 mg via INTRAVENOUS
  Filled 2012-03-30: qty 1

## 2012-03-30 MED ORDER — DOXAZOSIN MESYLATE 8 MG PO TABS
8.0000 mg | ORAL_TABLET | Freq: Every day | ORAL | Status: DC
  Administered 2012-03-31 – 2012-04-07 (×9): 8 mg via ORAL
  Filled 2012-03-30 (×10): qty 1

## 2012-03-30 MED ORDER — POTASSIUM CHLORIDE IN NACL 20-0.45 MEQ/L-% IV SOLN
INTRAVENOUS | Status: DC
  Administered 2012-03-31 (×2): via INTRAVENOUS
  Filled 2012-03-30 (×4): qty 1000

## 2012-03-30 NOTE — ED Provider Notes (Signed)
  Physical Exam  BP 167/77  Pulse 87  Temp 99.2 F (37.3 C) (Oral)  Resp 16  Ht 5' 2" (1.575 m)  Wt 150 lb (68.04 kg)  BMI 27.44 kg/m2  SpO2 99%  Physical Exam  ED Course  Procedures  MDM Asked to call ortho and hospitalitis to admit       Garald Balding, NP 03/30/12 2117  Garald Balding, NP 03/30/12 2118

## 2012-03-30 NOTE — H&P (Signed)
PCP:   [name], PA   Chief Complaint:  Fall  HPI: This is a 78 year old female who states she was at home today by her refrigerator, when her right leg gave out and she fell to her knee. She had immediate pain. She did not hit her head she did not loose consciousness. She denies any chest pain or any previous dizziness. The patient states she fell last week but she was on pain medications, however, she has not been on any pain medications today. Per patient and family she's had a recent bout of shingles and has been somewhat weak since. She does not treated fall.  The patient denies any cardiac history. She denies any chest pains, shortness of breath, history of congestive heart failure. She states she does all of her housework. She is normally to climb 2 flights of steps without any significant issues. History provided by patient and family at bedside.  Review of Systems:  The patient denies anorexia, fever, weight loss,, vision loss, decreased hearing, hoarseness, chest pain, syncope, dyspnea on exertion, peripheral edema, balance deficits, hemoptysis, abdominal pain, melena, hematochezia, severe indigestion/heartburn, hematuria, incontinence, genital sores, muscle weakness, suspicious skin lesions, transient blindness, difficulty walking, depression, unusual weight change, abnormal bleeding, enlarged lymph nodes, angioedema, and breast masses.  Past Medical History: Past Medical History  Diagnosis Date  . Hypertension   . Shingles   . Back pain   . Intestinal obstruction   . Complication of anesthesia     resp distress -on vent after surgery   Past Surgical History  Procedure Date  . Knee replacement surgery     bilateral  . Appendectomy   . Abdominal hysterectomy   . Small intestine surgery 2011    went into respiratory distress and was on ventilator after surgery    Medications: Prior to Admission medications   Medication Sig Start Date End Date Taking? Authorizing  Provider  doxazosin (CARDURA) 8 MG tablet Take 8 mg by mouth at bedtime.   Yes Historical Provider, MD  fenofibrate micronized (LOFIBRA) 134 MG capsule Take 134 mg by mouth daily.   Yes Historical Provider, MD  losartan-hydrochlorothiazide (HYZAAR) 100-25 MG per tablet Take 1 tablet by mouth daily.   Yes Historical Provider, MD  omeprazole (PRILOSEC) 20 MG capsule Take 20 mg by mouth every morning.   Yes Historical Provider, MD  temazepam (RESTORIL) 30 MG capsule Take 30 mg by mouth at bedtime as needed. Sleep   Yes Historical Provider, MD  tiZANidine (ZANAFLEX) 4 MG tablet Take 4 mg by mouth every 8 (eight) hours as needed. Muscle spasm   Yes Historical Provider, MD  traMADol-acetaminophen (ULTRACET) 37.5-325 MG per tablet Take 1 tablet by mouth every 6 (six) hours as needed. Pain   Yes Historical Provider, MD  valACYclovir (VALTREX) 1000 MG tablet Take 1,000 mg by mouth every 8 (eight) hours. Take for 7 days   Yes Historical Provider, MD    Allergies:   Allergies  Allergen Reactions  . Sulfa Antibiotics     Social History:  reports that she quit smoking about 21 years ago. She has never used smokeless tobacco. She reports that she does not drink alcohol or use illicit drugs.  Family History: History reviewed. No pertinent family history.  Physical Exam: Filed Vitals:   03/30/12 1741 03/30/12 1935 03/30/12 2120  BP: 167/80 167/77 158/71  Pulse: 90 87 94  Temp: 99.2 F (37.3 C)    TempSrc: Oral    Resp: _0 Height:  _0  (1.575 m)    Weight: 68.04 kg (150 lb)    SpO2: 98% 99%     General:  Alert and oriented times three, well developed and nourished, no acute distress Eyes: PERRLA, pink conjunctiva, no scleral icterus ENT: Moist oral mucosa, neck supple, no thyromegaly Lungs: clear to ascultation, no wheeze, no crackles, no use of accessory muscles Cardiovascular: regular rate and rhythm, no regurgitation, no gallops, no murmurs. No carotid bruits, no JVD Abdomen:  soft, positive BS, non-tender, non-distended, no organomegaly, not an acute abdomen GU: not examined Neuro: CN II - XII grossly intact, sensation intact Musculoskeletal: strength 5/5 all extremities, right lower extremity strength not assessed, no clubbing, cyanosis or edema Skin: no rash, no subcutaneous crepitation, no decubitus Psych: appropriate patient   Labs on Admission:   Basename 03/30/12 1850  NA 137  K 4.4  CL 101  CO2 23  GLUCOSE 161*  BUN 28*  CREATININE 0.88  CALCIUM 10.0  MG --  PHOS --   No results found for this basename: AST:2,ALT:2,ALKPHOS:2,BILITOT:2,PROT:2,ALBUMIN:2 in the last 72 hours No results found for this basename: LIPASE:2,AMYLASE:2 in the last 72 hours  Basename 03/30/12 1850  WBC 17.9*  NEUTROABS 16.3*  HGB 11.9*  HCT 35.1*  MCV 88.6  PLT 384    Basename 03/30/12 1850  CKTOTAL 94  CKMB --  CKMBINDEX --  TROPONINI --  Results for Joy, Erickson (MRN [id number]) as of 03/30/2012 21:46  Ref. Range 03/30/2012 20:23  Color, Urine Latest Range: YELLOW  YELLOW  APPearance Latest Range: CLEAR  CLEAR  Specific Gravity, Urine Latest Range: 1.005-1.030  1.024  pH Latest Range: 5.0-8.0  5.5  Glucose Latest Range: NEGATIVE mg/dL NEGATIVE  Bilirubin Urine Latest Range: NEGATIVE  NEGATIVE  Ketones, ur Latest Range: NEGATIVE mg/dL NEGATIVE  Protein Latest Range: NEGATIVE mg/dL NEGATIVE  Urobilinogen, UA Latest Range: 0.0-1.0 mg/dL 0.2  Nitrite Latest Range: NEGATIVE  NEGATIVE  Leukocytes, UA Latest Range: NEGATIVE  NEGATIVE  Hgb urine dipstick Latest Range: NEGATIVE  NEGATIVE   No components found with this basename: POCBNP:3 No results found for this basename: DDIMER:2 in the last 72 hours No results found for this basename: HGBA1C:2 in the last 72 hours No results found for this basename: CHOL:2,HDL:2,LDLCALC:2,TRIG:2,CHOLHDL:2,LDLDIRECT:2 in the last 72 hours No results found for this basename: TSH,T4TOTAL,FREET3,T3FREE,THYROIDAB in the last  72 hours No results found for this basename: VITAMINB12:2,FOLATE:2,FERRITIN:2,TIBC:2,IRON:2,RETICCTPCT:2 in the last 72 hours  Micro Results: No results found for this or any previous visit (from the past 240 hour(s)).   Radiological Exams on Admission: Dg Ribs Unilateral W/chest Left  03/30/2012  *RADIOLOGY REPORT*  Clinical Data: [place] today.  The patient reports no chest symptoms at this time.  LEFT RIBS AND CHEST - 3+ VIEW  Comparison: Chest dated 08/22/2009.  Findings: Normal sized heart.  Tortuous aorta.  Small hiatal hernia.  Clear lungs.  Mild diffuse peribronchial thickening and prominence of the interstitial markings.  Old, healed right rib fractures.  No acute rib fracture or pneumothorax seen.  Diffuse osteopenia.  IMPRESSION:  1.  No acute fracture. 2.  Small hiatal hernia. 3.  Mild chronic bronchitic changes.   Original Report Authenticated By: Gerald Stabs, M.D.    Dg Hip Complete Left  03/30/2012  *RADIOLOGY REPORT*  Clinical Data: Left femur pain following a fall today.  LEFT HIP - COMPLETE 2+ VIEW  Comparison: None.  Findings: Diffuse osteopenia.  No fracture or dislocation seen.  IMPRESSION: No fracture or dislocation.  Original Report Authenticated By: Gerald Stabs, M.D.    Dg Knee 1-2 Views Left  03/30/2012  *RADIOLOGY REPORT*  Clinical Data: Left femur and knee pain following a fall today.  LEFT KNEE - 1-2 VIEW  Comparison: None.  Findings: Comminuted fracture of the distal shaft and metadiaphysis of the femur with medial and posterior angulation of the distal fragment as well as approximately one half shaft width of medial posterior displacement of the distal fragment.  There is also a medially displaced middle fragment.  A total knee prosthesis is also noted.  Diffuse osteopenia.  IMPRESSION: Comminuted distal femur fracture, as described above.   Original Report Authenticated By: Gerald Stabs, M.D.     Assessment/Plan Present on Admission:  .Femur fracture,  right Admit to MedSurg  Pain management ordered Dr. Veverly Fells orthopedic surgeon aware, called by nurse practitioner NPO at midnight Medically cleared for surgery Hypertension GERD Resume home medication Leukocytosis Unclear etiology Monitor, CBC in a.m.  DVT prophylaxis DO NOT INTUBATE     [name] 03/30/2012, 9:51 PM

## 2012-03-30 NOTE — Consult Note (Signed)
Reason for Consult:  Left knee pain since fall today Referring Physician: Dr. Charline Bills is an 78 y.o. female.  HPI: [place] in kitchen today onto left knee.  Immediately pain and deformity.  No other area of complaints.  Household ambulator.  Lives alone.  Past Medical History  Diagnosis Date  . Hypertension   . Shingles   . Back pain   . Intestinal obstruction   . Complication of anesthesia     resp distress -on vent after surgery    Past Surgical History  Procedure Date  . Knee replacement surgery     bilateral  . Appendectomy   . Abdominal hysterectomy   . Small intestine surgery 2011    went into respiratory distress and was on ventilator after surgery    History reviewed. No pertinent family history.  Social History:  reports that she quit smoking about 21 years ago. She has never used smokeless tobacco. She reports that she does not drink alcohol or use illicit drugs.  Allergies:  Allergies  Allergen Reactions  . Sulfa Antibiotics     Medications: I have reviewed the patient's current medications.  Results for orders placed during the hospital encounter of 03/30/12 (from the past 48 hour(s))  CBC WITH DIFFERENTIAL     Status: Abnormal   Collection Time   03/30/12  6:50 PM      Component Value Range Comment   WBC 17.9 (*) 4.0 - 10.5 K/uL    RBC 3.96  3.87 - 5.11 MIL/uL    Hemoglobin 11.9 (*) 12.0 - 15.0 g/dL    HCT 35.1 (*) 36.0 - 46.0 %    MCV 88.6  78.0 - 100.0 fL    MCH 30.1  26.0 - 34.0 pg    MCHC 33.9  30.0 - 36.0 g/dL    RDW 13.5  11.5 - 15.5 %    Platelets 384  150 - 400 K/uL    Neutrophils Relative 91 (*) 43 - 77 %    Neutro Abs 16.3 (*) 1.7 - 7.7 K/uL    Lymphocytes Relative 6 (*) 12 - 46 %    Lymphs Abs 1.1  0.7 - 4.0 K/uL    Monocytes Relative 3  3 - 12 %    Monocytes Absolute 0.5  0.1 - 1.0 K/uL    Eosinophils Relative 0  0 - 5 %    Eosinophils Absolute 0.0  0.0 - 0.7 K/uL    Basophils Relative 0  0 - 1 %    Basophils Absolute 0.0   0.0 - 0.1 K/uL   CK     Status: Normal   Collection Time   03/30/12  6:50 PM      Component Value Range Comment   Total CK 94  7 - 177 U/L   BASIC METABOLIC PANEL     Status: Abnormal   Collection Time   03/30/12  6:50 PM      Component Value Range Comment   Sodium 137  135 - 145 mEq/L    Potassium 4.4  3.5 - 5.1 mEq/L    Chloride 101  96 - 112 mEq/L    CO2 23  19 - 32 mEq/L    Glucose, Bld 161 (*) 70 - 99 mg/dL    BUN 28 (*) 6 - 23 mg/dL    Creatinine, Ser 0.88  0.50 - 1.10 mg/dL    Calcium 10.0  8.4 - 10.5 mg/dL    GFR calc non Af  Amer 61 (*) >90 mL/min    GFR calc Af Amer 71 (*) >90 mL/min   POCT I-STAT TROPONIN I     Status: Normal   Collection Time   03/30/12  7:08 PM      Component Value Range Comment   Troponin i, poc 0.01  0.00 - 0.08 ng/mL    Comment 3            URINALYSIS, ROUTINE W REFLEX MICROSCOPIC     Status: Normal   Collection Time   03/30/12  8:23 PM      Component Value Range Comment   Color, Urine YELLOW  YELLOW    APPearance CLEAR  CLEAR    Specific Gravity, Urine 1.024  1.005 - 1.030    pH 5.5  5.0 - 8.0    Glucose, UA NEGATIVE  NEGATIVE mg/dL    Hgb urine dipstick NEGATIVE  NEGATIVE    Bilirubin Urine NEGATIVE  NEGATIVE    Ketones, ur NEGATIVE  NEGATIVE mg/dL    Protein, ur NEGATIVE  NEGATIVE mg/dL    Urobilinogen, UA 0.2  0.0 - 1.0 mg/dL    Nitrite NEGATIVE  NEGATIVE    Leukocytes, UA NEGATIVE  NEGATIVE MICROSCOPIC NOT DONE ON URINES WITH NEGATIVE PROTEIN, BLOOD, LEUKOCYTES, NITRITE, OR GLUCOSE <1000 mg/dL.    Dg Ribs Unilateral W/chest Left  03/30/2012  *RADIOLOGY REPORT*  Clinical Data: [place] today.  The patient reports no chest symptoms at this time.  LEFT RIBS AND CHEST - 3+ VIEW  Comparison: Chest dated 08/22/2009.  Findings: Normal sized heart.  Tortuous aorta.  Small hiatal hernia.  Clear lungs.  Mild diffuse peribronchial thickening and prominence of the interstitial markings.  Old, healed right rib fractures.  No acute rib fracture or  pneumothorax seen.  Diffuse osteopenia.  IMPRESSION:  1.  No acute fracture. 2.  Small hiatal hernia. 3.  Mild chronic bronchitic changes.   Original Report Authenticated By: Gerald Stabs, M.D.    Dg Hip Complete Left  03/30/2012  *RADIOLOGY REPORT*  Clinical Data: Left femur pain following a fall today.  LEFT HIP - COMPLETE 2+ VIEW  Comparison: None.  Findings: Diffuse osteopenia.  No fracture or dislocation seen.  IMPRESSION: No fracture or dislocation.   Original Report Authenticated By: Gerald Stabs, M.D.    Dg Knee 1-2 Views Left  03/30/2012  *RADIOLOGY REPORT*  Clinical Data: Left femur and knee pain following a fall today.  LEFT KNEE - 1-2 VIEW  Comparison: None.  Findings: Comminuted fracture of the distal shaft and metadiaphysis of the femur with medial and posterior angulation of the distal fragment as well as approximately one half shaft width of medial posterior displacement of the distal fragment.  There is also a medially displaced middle fragment.  A total knee prosthesis is also noted.  Diffuse osteopenia.  IMPRESSION: Comminuted distal femur fracture, as described above.   Original Report Authenticated By: Gerald Stabs, M.D.     Review of Systems  Constitutional: Negative for fever and chills.  Cardiovascular: Negative for chest pain.  Musculoskeletal:       Left knee pain only.  No pain in bilateral UE and right LE  Neurological: Negative for dizziness and loss of consciousness.   Blood pressure 158/71, pulse 94, temperature 99.2 F (37.3 C), temperature source Oral, resp. rate 16, height 5' 2" (1.575 m), weight 68.04 kg (150 lb), SpO2 99.00%. Physical Exam  Constitutional: She is oriented to person, place, and time. She appears  well-developed and well-nourished.  HENT:  Head: Normocephalic and atraumatic.  Eyes: EOM are normal.  Cardiovascular: Intact distal pulses.   GI: Soft.  Musculoskeletal:       Tenderness proximal left knee with swelling AROM B ankles  without pain compartments soft thigh, leg bilaterally Nontender over bilateral shoulders, elbows, wrists, hands, ankles and feet   Neurological: She is alert and oriented to person, place, and time.       Sensation intact to light touch over feet equal bilateral  Skin: Skin is warm.  Psychiatric: She has a normal mood and affect. Her behavior is normal. Judgment and thought content normal.    Assessment/Plan: Comminuted left periprosthetic distal femur fx Knee immobilizer for comfort.  After it was applied no change in neurovascular status. Will discuss the case with Dr Alvan Dame. NPO after MN Consent for ORIF left distal femur fx possible revision TKA Medical clearance and optimization.   Josseline Reddin A 03/30/2012, 9:37 PM

## 2012-03-30 NOTE — ED Notes (Signed)
MD at bedside. 

## 2012-03-30 NOTE — ED Provider Notes (Signed)
[name] is a 78 y.o. female who lives independently, in her house, was in the kitchen today, when she suddenly fell to the floor, striking her left knee. She was unable to get up due to severe pain in her left knee. She denies head injury, neck injury, back pain, extremity discomfort, other than the left knee.  She is alert, calm, cooperative, and mild pain. Respiratory normal effort. Abdomen soft. Extremities, left knee, flexed at 30 moderate swelling. Resists extension, secondary to pain. Intact pulses both feet. Normal distal sensation Left leg.  Case discussed with Dr, Beola Cord, the hospitalist will admit.   Medical screening examination/treatment/procedure(s) were conducted as a shared visit with non-physician practitioner(s) and myself.  I personally evaluated the patient during the encounter  Richarda Blade, MD 03/31/12 [phone]

## 2012-03-30 NOTE — ED Notes (Signed)
Ortho tech at bedside putting on knee immobilizer.

## 2012-03-30 NOTE — ED Notes (Signed)
PA at bedside.

## 2012-03-30 NOTE — ED Provider Notes (Signed)
History     CSN: [id number]  Arrival date & time 03/30/12  1715   First MD Initiated Contact with Patient 03/30/12 1815      Chief Complaint  Patient presents with  . Fall    (Consider location/radiation/quality/duration/timing/severity/associated sxs/prior treatment) Patient is a 78 y.o. female presenting with fall.  Fall The accident occurred 6 to 12 hours ago.    78 y.o. female brought in by EMS complaining of fall with pain to left knee pain. Patient denies mechanical trip and fall, she also denies any loss of consciousness or dizziness. She was reaching into the refrigerator to get orange juice and just fell. Patient was discovered by a Psychologist, educational and had been on the ground for 5 hours. She reports 10 out of 10 left knee pain, exacerbated by movement or palpation, unrelieved by fentanyl. Patient also has shingles to left anterior chest. Patient affirms pain to that area because of the shingles, she denies shortness of breath, nausea vomiting, abdominal pain, vertigo or lightheaded sensation. Patient has osteopenia and bilateral TKR.    Past Medical History  Diagnosis Date  . Hypertension   . Shingles   . Back pain   . Intestinal obstruction     Past Surgical History  Procedure Date  . Knee replacement surgery     bilateral    No family history on file.  History  Substance Use Topics  . Smoking status: Former Research scientist (life sciences)  . Smokeless tobacco: Not on file  . Alcohol Use: No    OB History    Grav Para Term Preterm Abortions TAB SAB Ect Mult Living                  Review of Systems  Constitutional: Negative for fatigue.  All other systems reviewed and are negative.    Allergies  Sulfa antibiotics  Home Medications   Current Outpatient Rx  Name Route Sig Dispense Refill  . DOXAZOSIN MESYLATE 8 MG PO TABS Oral Take 8 mg by mouth at bedtime.    . FENOFIBRATE MICRONIZED 134 MG PO CAPS Oral Take 134 mg by mouth daily.    Marland Kitchen LOSARTAN POTASSIUM-HCTZ 100-25 MG  PO TABS Oral Take 1 tablet by mouth daily.    Marland Kitchen OMEPRAZOLE 20 MG PO CPDR Oral Take 20 mg by mouth every morning.    Marland Kitchen TEMAZEPAM 30 MG PO CAPS Oral Take 30 mg by mouth at bedtime as needed. Sleep    . TIZANIDINE HCL 4 MG PO TABS Oral Take 4 mg by mouth every 8 (eight) hours as needed. Muscle spasm    . TRAMADOL-ACETAMINOPHEN 37.5-325 MG PO TABS Oral Take 1 tablet by mouth every 6 (six) hours as needed. Pain    . VALACYCLOVIR HCL 1 G PO TABS Oral Take 1,000 mg by mouth every 8 (eight) hours. Take for 7 days      BP 167/80  Pulse 90  Temp 99.2 F (37.3 C) (Oral)  Resp 16  Ht _0  (1.575 m)  Wt 150 lb (68.04 kg)  BMI 27.44 kg/m2  SpO2 98%  Physical Exam  Nursing note and vitals reviewed. Constitutional: She is oriented to person, place, and time. She appears well-developed and well-nourished. No distress.  HENT:  Head: Normocephalic and atraumatic.  Right Ear: External ear normal.  Left Ear: External ear normal.  Mouth/Throat: Oropharynx is clear and moist.  Eyes: Conjunctivae and EOM are normal. Pupils are equal, round, and reactive to light.  Neck: Normal range of  motion.       No midline tenderness or step-offs appreciated  Cardiovascular: Normal rate, regular rhythm, normal heart sounds and intact distal pulses.   Pulmonary/Chest: Effort normal and breath sounds normal. No respiratory distress. She has no wheezes. She has no rales. She exhibits tenderness.       Patient is tender to left anterior chest. No rash to area he  Abdominal: Soft. Bowel sounds are normal.  Musculoskeletal: Normal range of motion.       And internally rotated left leg. Dorsalis pedis 2+ bilaterally patient has as FROM to toes and ankle  Neurological: She is alert and oriented to person, place, and time.  Skin: Skin is warm and dry.  Psychiatric: She has a normal mood and affect.    ED Course  Procedures (including critical care time)  Labs Reviewed  CBC WITH DIFFERENTIAL - Abnormal; Notable for  the following:    WBC 17.9 (*)     Hemoglobin 11.9 (*)     HCT 35.1 (*)     Neutrophils Relative 91 (*)     Neutro Abs 16.3 (*)     Lymphocytes Relative 6 (*)     All other components within normal limits  BASIC METABOLIC PANEL - Abnormal; Notable for the following:    Glucose, Bld 161 (*)     BUN 28 (*)     GFR calc non Af Amer 61 (*)     GFR calc Af Amer 71 (*)     All other components within normal limits  CK  POCT I-STAT TROPONIN I  URINALYSIS, ROUTINE W REFLEX MICROSCOPIC   Dg Ribs Unilateral W/chest Left  03/30/2012  *RADIOLOGY REPORT*  Clinical Data: [place] today.  The patient reports no chest symptoms at this time.  LEFT RIBS AND CHEST - 3+ VIEW  Comparison: Chest dated 08/22/2009.  Findings: Normal sized heart.  Tortuous aorta.  Small hiatal hernia.  Clear lungs.  Mild diffuse peribronchial thickening and prominence of the interstitial markings.  Old, healed right rib fractures.  No acute rib fracture or pneumothorax seen.  Diffuse osteopenia.  IMPRESSION:  1.  No acute fracture. 2.  Small hiatal hernia. 3.  Mild chronic bronchitic changes.   Original Report Authenticated By: Gerald Stabs, M.D.    Dg Hip Complete Left  03/30/2012  *RADIOLOGY REPORT*  Clinical Data: Left femur pain following a fall today.  LEFT HIP - COMPLETE 2+ VIEW  Comparison: None.  Findings: Diffuse osteopenia.  No fracture or dislocation seen.  IMPRESSION: No fracture or dislocation.   Original Report Authenticated By: Gerald Stabs, M.D.    Dg Knee 1-2 Views Left  03/30/2012  *RADIOLOGY REPORT*  Clinical Data: Left femur and knee pain following a fall today.  LEFT KNEE - 1-2 VIEW  Comparison: None.  Findings: Comminuted fracture of the distal shaft and metadiaphysis of the femur with medial and posterior angulation of the distal fragment as well as approximately one half shaft width of medial posterior displacement of the distal fragment.  There is also a medially displaced middle fragment.  A total knee  prosthesis is also noted.  Diffuse osteopenia.  IMPRESSION: Comminuted distal femur fracture, as described above.   Original Report Authenticated By: Gerald Stabs, M.D.     Date: 03/30/2012  Rate: 78  Rhythm: normal sinus rhythm  QRS Axis: normal  Intervals: normal  ST/T Wave abnormalities: nonspecific ST changes  Conduction Disutrbances:none  Narrative Interpretation:   Old EKG Reviewed: unchanged  1. Femur fracture, left       MDM  78 y.o. female with left knee pain status post fall from unknown cause several hours ago. Patient was down for 5 hours. Physical exam shows left leg is shortened and internally rotated. I will x-ray the hip and knee.  Cleared from C-spine collar using nexus criteria.   Knee x-ray shows comminuted distal femur fracture with medial and posterior angulation. I will consult her orthopedist Dr. Veverly Fells.   Signed out to PA [place] at shift change.        Monico Blitz, PA-C 03/30/12 2104

## 2012-03-30 NOTE — ED Notes (Signed)
Ortho at bedside.

## 2012-03-30 NOTE — ED Notes (Signed)
Patient fell at home, no loss of conciousness.  She is having left knee pain and was unable to get up at home.  Pt discovered by babysitter and patient had been lying down for five hours.  Pt has shingles on left side of chest and has been unable to take medication so she is in considerable pain from that.

## 2012-03-30 NOTE — ED Notes (Signed)
[id number]<YN> Expected date:03/30/12<BR> Expected time: 5:03 PM<BR> Means of arrival:<BR> Comments:<BR> 65 Female fall from home

## 2012-03-31 ENCOUNTER — Encounter (HOSPITAL_COMMUNITY)
Admission: EM | Disposition: A | Discharge status: Skilled Nursing Facility | Payer: Self-pay | Source: Home / Self Care | Attending: Internal Medicine

## 2012-03-31 ENCOUNTER — Inpatient Hospital Stay (HOSPITAL_COMMUNITY): Payer: Medicare Other | Admitting: Anesthesiology

## 2012-03-31 ENCOUNTER — Inpatient Hospital Stay (HOSPITAL_COMMUNITY): Payer: Medicare Other

## 2012-03-31 ENCOUNTER — Encounter (HOSPITAL_COMMUNITY): Payer: Self-pay | Admitting: Anesthesiology

## 2012-03-31 DIAGNOSIS — D649 Anemia, unspecified: Secondary | ICD-10-CM | POA: Diagnosis present

## 2012-03-31 DIAGNOSIS — Z8619 Personal history of other infectious and parasitic diseases: Secondary | ICD-10-CM | POA: Diagnosis not present

## 2012-03-31 DIAGNOSIS — S7292XA Unspecified fracture of left femur, initial encounter for closed fracture: Secondary | ICD-10-CM | POA: Diagnosis present

## 2012-03-31 DIAGNOSIS — W19XXXA Unspecified fall, initial encounter: Secondary | ICD-10-CM

## 2012-03-31 DIAGNOSIS — D631 Anemia in chronic kidney disease: Secondary | ICD-10-CM | POA: Diagnosis present

## 2012-03-31 HISTORY — PX: ORIF PERIPROSTHETIC FRACTURE: SHX5034

## 2012-03-31 LAB — BASIC METABOLIC PANEL
CO2: 23 mEq/L (ref 19–32)
Calcium: 8.9 mg/dL (ref 8.4–10.5)
Chloride: 104 mEq/L (ref 96–112)
Sodium: 136 mEq/L (ref 135–145)

## 2012-03-31 LAB — CBC
MCV: 89.6 fL (ref 78.0–100.0)
Platelets: 321 10*3/uL (ref 150–400)
RBC: 3.18 MIL/uL — ABNORMAL LOW (ref 3.87–5.11)
WBC: 12.7 10*3/uL — ABNORMAL HIGH (ref 4.0–10.5)

## 2012-03-31 LAB — GLUCOSE, CAPILLARY: Glucose-Capillary: 129 mg/dL — ABNORMAL HIGH (ref 70–99)

## 2012-03-31 LAB — PREPARE RBC (CROSSMATCH)

## 2012-03-31 LAB — SURGICAL PCR SCREEN: Staphylococcus aureus: POSITIVE — AB

## 2012-03-31 SURGERY — OPEN REDUCTION INTERNAL FIXATION (ORIF) PERIPROSTHETIC FRACTURE
Anesthesia: General | Site: Leg Upper | Laterality: Left | Wound class: Clean

## 2012-03-31 MED ORDER — GLYCOPYRROLATE 0.2 MG/ML IJ SOLN
INTRAMUSCULAR | Status: DC | PRN
  Administered 2012-03-31: 0.6 mg via INTRAVENOUS

## 2012-03-31 MED ORDER — HYDROMORPHONE HCL PF 1 MG/ML IJ SOLN
INTRAMUSCULAR | Status: DC | PRN
  Administered 2012-03-31 (×2): 1 mg via INTRAVENOUS

## 2012-03-31 MED ORDER — FENTANYL CITRATE 0.05 MG/ML IJ SOLN
25.0000 ug | INTRAMUSCULAR | Status: DC | PRN
  Administered 2012-03-31 (×2): 50 ug via INTRAVENOUS

## 2012-03-31 MED ORDER — HYDROMORPHONE HCL PF 1 MG/ML IJ SOLN
1.0000 mg | INTRAMUSCULAR | Status: DC | PRN
  Administered 2012-03-31 (×4): 1 mg via INTRAVENOUS
  Filled 2012-03-31 (×3): qty 1

## 2012-03-31 MED ORDER — METHOCARBAMOL 100 MG/ML IJ SOLN
500.0000 mg | Freq: Four times a day (QID) | INTRAVENOUS | Status: DC | PRN
  Administered 2012-03-31 – 2012-04-01 (×2): 500 mg via INTRAVENOUS
  Filled 2012-03-31 (×2): qty 5

## 2012-03-31 MED ORDER — NEOSTIGMINE METHYLSULFATE 1 MG/ML IJ SOLN
INTRAMUSCULAR | Status: DC | PRN
  Administered 2012-03-31: 4 mg via INTRAVENOUS

## 2012-03-31 MED ORDER — LACTATED RINGERS IV SOLN
INTRAVENOUS | Status: DC | PRN
  Administered 2012-03-31 (×3): via INTRAVENOUS

## 2012-03-31 MED ORDER — CHLORHEXIDINE GLUCONATE CLOTH 2 % EX PADS
6.0000 | MEDICATED_PAD | Freq: Every day | CUTANEOUS | Status: AC
  Administered 2012-03-31 – 2012-04-04 (×4): 6 via TOPICAL

## 2012-03-31 MED ORDER — LIDOCAINE HCL (CARDIAC) 20 MG/ML IV SOLN
INTRAVENOUS | Status: DC | PRN
  Administered 2012-03-31: 80 mg via INTRAVENOUS

## 2012-03-31 MED ORDER — EPHEDRINE SULFATE 50 MG/ML IJ SOLN
INTRAMUSCULAR | Status: DC | PRN
  Administered 2012-03-31: 10 mg via INTRAVENOUS
  Administered 2012-03-31: 5 mg via INTRAVENOUS

## 2012-03-31 MED ORDER — CHLORHEXIDINE GLUCONATE CLOTH 2 % EX PADS: 6.0000 | MEDICATED_PAD | Freq: Every day | CUTANEOUS | Status: DC

## 2012-03-31 MED ORDER — HYDRALAZINE HCL 20 MG/ML IJ SOLN
5.0000 mg | Freq: Once | INTRAMUSCULAR | Status: AC
  Administered 2012-03-31: 5 mg via INTRAVENOUS

## 2012-03-31 MED ORDER — PROMETHAZINE HCL 25 MG/ML IJ SOLN
6.2500 mg | INTRAMUSCULAR | Status: DC | PRN
  Administered 2012-03-31: 6.25 mg via INTRAVENOUS

## 2012-03-31 MED ORDER — SUCCINYLCHOLINE CHLORIDE 20 MG/ML IJ SOLN
INTRAMUSCULAR | Status: DC | PRN
  Administered 2012-03-31: 100 mg via INTRAVENOUS

## 2012-03-31 MED ORDER — MUPIROCIN 2 % EX OINT: 1.0000 "application " | TOPICAL_OINTMENT | Freq: Two times a day (BID) | CUTANEOUS | Status: DC

## 2012-03-31 MED ORDER — LACTATED RINGERS IV SOLN: INTRAVENOUS | Status: DC

## 2012-03-31 MED ORDER — MUPIROCIN 2 % EX OINT
1.0000 "application " | TOPICAL_OINTMENT | Freq: Two times a day (BID) | CUTANEOUS | Status: AC
  Administered 2012-03-31 – 2012-04-04 (×10): 1 via NASAL
  Filled 2012-03-31 (×2): qty 22

## 2012-03-31 MED ORDER — PROPOFOL 10 MG/ML IV BOLUS
INTRAVENOUS | Status: DC | PRN
  Administered 2012-03-31: 160 mg via INTRAVENOUS

## 2012-03-31 MED ORDER — METHOCARBAMOL 500 MG PO TABS
500.0000 mg | ORAL_TABLET | Freq: Four times a day (QID) | ORAL | Status: DC | PRN
  Administered 2012-04-01 – 2012-04-08 (×12): 500 mg via ORAL
  Filled 2012-03-31 (×12): qty 1

## 2012-03-31 MED ORDER — FENTANYL CITRATE 0.05 MG/ML IJ SOLN
INTRAMUSCULAR | Status: DC | PRN
  Administered 2012-03-31 (×4): 50 ug via INTRAVENOUS
  Administered 2012-03-31: 100 ug via INTRAVENOUS

## 2012-03-31 MED ORDER — ROCURONIUM BROMIDE 100 MG/10ML IV SOLN
INTRAVENOUS | Status: DC | PRN
  Administered 2012-03-31: 5 mg via INTRAVENOUS
  Administered 2012-03-31: 25 mg via INTRAVENOUS
  Administered 2012-03-31: 10 mg via INTRAVENOUS

## 2012-03-31 SURGICAL SUPPLY — 71 items
BAG ZIPLOCK 12X15 (MISCELLANEOUS) ×2 IMPLANT
BANDAGE ELASTIC 6 VELCRO ST LF (GAUZE/BANDAGES/DRESSINGS) ×2 IMPLANT
BIT DRILL 3.2 CALIBRATED (BIT) ×1
BIT DRILL 3.2MM CALIBRATED (BIT) ×1 IMPLANT
BIT DRILL 3.8 CALIBRATED (BIT) ×1
BIT DRILL 3.8MM CALIBRATED (BIT) ×1 IMPLANT
BIT DRILL CANN SZ 5.5 (BIT) ×1 IMPLANT
CLOTH BEACON ORANGE TIMEOUT ST (SAFETY) ×2 IMPLANT
DEPUY POLYAX FEMUR 3.8 DRILL BIT ×2 IMPLANT
DRAPE INCISE IOBAN 66X45 STRL (DRAPES) ×2 IMPLANT
DRAPE ORTHO SPLIT 77X108 STRL (DRAPES) ×2
DRAPE POUCH INSTRU U-SHP 10X18 (DRAPES) ×2 IMPLANT
DRAPE SURG 17X11 SM STRL (DRAPES) IMPLANT
DRAPE SURG ORHT 6 SPLT 77X108 (DRAPES) ×2 IMPLANT
DRAPE U-SHAPE 47X51 STRL (DRAPES) ×2 IMPLANT
DRILL BIT 3.2MM CALIBRATED (BIT) ×1
DRILL BIT 3.8MM CALIBRATED (BIT) ×1
DRILL BIT CANN SZ 5.5 (BIT) ×2
DRSG ADAPTIC 3X8 NADH LF (GAUZE/BANDAGES/DRESSINGS) ×2 IMPLANT
DRSG EMULSION OIL 3X16 NADH (GAUZE/BANDAGES/DRESSINGS) ×2 IMPLANT
DRSG PAD ABDOMINAL 8X10 ST (GAUZE/BANDAGES/DRESSINGS) ×2 IMPLANT
DURAPREP 26ML APPLICATOR (WOUND CARE) ×4 IMPLANT
ELECT BLADE TIP CTD 4 INCH (ELECTRODE) ×2 IMPLANT
ELECT REM PT RETURN 9FT ADLT (ELECTROSURGICAL) ×2
ELECTRODE REM PT RTRN 9FT ADLT (ELECTROSURGICAL) ×1 IMPLANT
EVACUATOR 1/8 PVC DRAIN (DRAIN) ×2 IMPLANT
FACESHIELD LNG OPTICON STERILE (SAFETY) ×8 IMPLANT
GLOVE BIOGEL PI IND STRL 7.5 (GLOVE) ×2 IMPLANT
GLOVE BIOGEL PI IND STRL 8 (GLOVE) ×1 IMPLANT
GLOVE BIOGEL PI INDICATOR 7.5 (GLOVE) ×2
GLOVE BIOGEL PI INDICATOR 8 (GLOVE) ×1
GLOVE ECLIPSE 8.0 STRL XLNG CF (GLOVE) IMPLANT
GLOVE ORTHO TXT STRL SZ7.5 (GLOVE) ×2 IMPLANT
GLOVE SURG ORTHO 8.0 STRL STRW (GLOVE) ×2 IMPLANT
GOWN BRE IMP PREV XXLGXLNG (GOWN DISPOSABLE) ×2 IMPLANT
GOWN STRL NON-REIN LRG LVL3 (GOWN DISPOSABLE) ×4 IMPLANT
GUIDEPIN 3.2  ENDO CALB STRL (PIN) ×1
GUIDEPIN 3.2 ENDO CALB STRL (PIN) ×1 IMPLANT
IMMOBILIZER KNEE 20 (SOFTGOODS) ×2
IMMOBILIZER KNEE 20 THIGH 36 (SOFTGOODS) ×1 IMPLANT
KIT BASIN OR (CUSTOM PROCEDURE TRAY) ×2 IMPLANT
MANIFOLD NEPTUNE II (INSTRUMENTS) ×2 IMPLANT
NS IRRIG 1000ML POUR BTL (IV SOLUTION) ×4 IMPLANT
PACK TOTAL JOINT (CUSTOM PROCEDURE TRAY) ×2 IMPLANT
PADDING CAST COTTON 6X4 STRL (CAST SUPPLIES) ×2 IMPLANT
PASSER SUT SWANSON 36MM LOOP (INSTRUMENTS) IMPLANT
PLATE FEMORAL 9H LT (Plate) ×2 IMPLANT
POSITIONER SURGICAL ARM (MISCELLANEOUS) ×2 IMPLANT
SCREW CANN LOCK 8.0 35 LTH (Screw) ×2 IMPLANT
SCREW LOCK DIST FEM 5.5X50 (Screw) ×2 IMPLANT
SCREW LOCK DIST FEM 5.5X65 (Screw) ×6 IMPLANT
SCREW NLOCK CORT 4.5X34 (Screw) ×8 IMPLANT
SCREW NLOCK CORT 4.5X50 (Screw) ×2 IMPLANT
SCREW NLOCK CORT STAR 4.5X36 (Screw) ×2 IMPLANT
SCREW NLOCK CORT STAR 4.5X46 (Screw) ×2 IMPLANT
SPONGE GAUZE 4X4 12PLY (GAUZE/BANDAGES/DRESSINGS) ×2 IMPLANT
SPONGE LAP 18X18 X RAY DECT (DISPOSABLE) ×2 IMPLANT
SPONGE LAP 4X18 X RAY DECT (DISPOSABLE) IMPLANT
STAPLER VISISTAT 35W (STAPLE) ×2 IMPLANT
STRIP CLOSURE SKIN 1/2X4 (GAUZE/BANDAGES/DRESSINGS) IMPLANT
SUCTION FRAZIER TIP 10 FR DISP (SUCTIONS) IMPLANT
SUT ETHIBOND NAB CT1 #1 30IN (SUTURE) IMPLANT
SUT MNCRL AB 4-0 PS2 18 (SUTURE) IMPLANT
SUT VIC AB 1 CT1 27 (SUTURE) ×2
SUT VIC AB 1 CT1 27XBRD ANTBC (SUTURE) ×2 IMPLANT
SUT VIC AB 2-0 CT1 27 (SUTURE) ×2
SUT VIC AB 2-0 CT1 TAPERPNT 27 (SUTURE) ×2 IMPLANT
SUT VLOC 180 0 24IN GS25 (SUTURE) ×2 IMPLANT
TOWEL OR 17X26 10 PK STRL BLUE (TOWEL DISPOSABLE) ×4 IMPLANT
TRAY FOLEY CATH 14FRSI W/METER (CATHETERS) IMPLANT
WATER STERILE IRR 1500ML POUR (IV SOLUTION) IMPLANT

## 2012-03-31 NOTE — Progress Notes (Signed)
CARE MANAGEMENT NOTE 03/31/2012  Patient:  [name], [name]   Account Number:  [id number]  Date Initiated:  03/31/2012  Documentation initiated by:  Olga Coaster  Subjective/Objective Assessment:   ADMITTED WITH RIGHT REMUR FRACTURE     Action/Plan:   PCP: [name], PA  LIVES AT HOME ALONE; POSSIBLY NEEDS SHORT TERM SNF AT DISCHARGE   Anticipated DC Date:  04/07/2012   Anticipated DC Plan:  SKILLED NURSING FACILITY  In-house referral  Clinical Social Worker      DC Planning Services  CM consult               Status of service:  In process, will continue to follow Medicare Important Message given?  NA - LOS <3 / Initial given by admissions (If response is "NO", the following Medicare IM given date fields will be blank)  Per UR Regulation:  Reviewed for med. necessity/level of care/duration of stay  Comments:  03/31/2012- B Brantley Wiley RN, BSN, MHA

## 2012-03-31 NOTE — Progress Notes (Signed)
Subjective:    Fairly comfortable given situation, no other complaints other than left leg pain. Expresses some anxiety and that she will occasionally use xanax at home and is wondering if she could get one    Patient reports pain as moderate.  Objective:   VITALS:   Filed Vitals:   03/31/12 1358  BP: 127/81  Pulse: 83  Temp: 98.3 F (36.8 C)  Resp: 16    Neurologically intact Left leg in knee immobilizer with knee bent at fracture femur site  LABS  Basename 03/31/12 0430 03/30/12 1850  HGB 9.4* 11.9*  HCT 28.5* 35.1*  WBC 12.7* 17.9*  PLT 321 384     Basename 03/31/12 0430 03/30/12 1850  NA 136 137  K 4.0 4.4  BUN 26* 28*  CREATININE 1.06 0.88  GLUCOSE 119* 161*    No results found for this basename: LABPT:2,INR:2 in the last 72 hours   Assessment/Plan:    Left distal femur periprosthetic fracture  Plan: NPO To OR today for ORIF of left distal femur Consent on chart Anxiety per medicine

## 2012-03-31 NOTE — Anesthesia Postprocedure Evaluation (Signed)
Anesthesia Post Note  Patient: [name]  Procedure(s) Performed: Procedure(s) (LRB): OPEN REDUCTION INTERNAL FIXATION (ORIF) PERIPROSTHETIC FRACTURE (Left)  Anesthesia type: General  Patient location: PACU  Post pain: Pain level controlled  Post assessment: Post-op Vital signs reviewed  Last Vitals:  Filed Vitals:   03/31/12 2330  BP: 161/73  Pulse:   Temp: 37.4 C  Resp: 10    Post vital signs: Reviewed  Level of consciousness: sedated  Complications: No apparent anesthesia complications

## 2012-03-31 NOTE — Transfer of Care (Signed)
Immediate Anesthesia Transfer of Care Note  Patient: [name]  Procedure(s) Performed: Procedure(s) (LRB): OPEN REDUCTION INTERNAL FIXATION (ORIF) PERIPROSTHETIC FRACTURE (Left)  Patient Location: PACU  Anesthesia Type: General  Level of Consciousness: awake, alert  and oriented  Airway & Oxygen Therapy: Patient Spontanous Breathing and Patient connected to face mask oxygen  Post-op Assessment: Report given to PACU RN and Post -op Vital signs reviewed and stable  Post vital signs: Reviewed and stable  Complications: No apparent anesthesia complications

## 2012-03-31 NOTE — Brief Op Note (Signed)
03/30/2012 - 03/31/2012  [facility]  MRN: [id number] CSN: [id number]  10:06 PM  PATIENT:  [name]  78 y.o. female  PRE-OPERATIVE DIAGNOSIS:  Left distal  femur periprosthetic fracture  POST-OPERATIVE DIAGNOSIS:  Left distal femur periprosthetic fracture, comminuted  PROCEDURE:  Procedure(s) (LRB): OPEN REDUCTION INTERNAL FIXATION (ORIF) PERIPROSTHETIC FRACTURE (Left)  Biomet 9 hole poly-axial plate, 5 distal screws, 2 16-gauge wires, 4 proximal screws  SURGEON:  Surgeon(s) and Role:    * Mauri Pole, MD - Primary  PHYSICIAN ASSISTANT: Danae Orleans, PA-C  ANESTHESIA:   general  EBL:  Total I/O In: 2000 [I.V.:2000] Out: 650 [Urine:400; Blood:250]  BLOOD ADMINISTERED:none  DRAINS: none   LOCAL MEDICATIONS USED:  NONE  SPECIMEN:  No Specimen  DISPOSITION OF SPECIMEN:  N/A  COUNTS:  YES  TOURNIQUET:  * No tourniquets in log *  DICTATION: .Other Dictation: Dictation Number [id number]  PLAN OF CARE: Admit to inpatient   PATIENT DISPOSITION:  PACU - hemodynamically stable.   Delay start of Pharmacological VTE agent (>24hrs) due to surgical blood loss or risk of bleeding: no

## 2012-03-31 NOTE — Progress Notes (Signed)
[facility] PROGRESS NOTE  [name] HIV:[id number] DOB: 06-05-1932 DOA: 03/30/2012 PCP: [name], PA  Assessment/Plan: Active Problems:  Femur fracture, right  Hypertension  Fall at home  History of shingles  Anemia  1. Comminuted left periprosthetic distal femoral fracture: Orthopedic consultation appreciated. Currently in a knee immobilizer. Plan for surgery this evening. Patient is at mild to moderate risk for perioperative cardiac events but may proceed for surgery without any further ischemic workup. Pain management. 2. Hypertension: Reasonably controlled. Continue home medications-Doxazosin, Hyzaar. 3. History of shingles/postherpetic neuralgia: Healed rash of shingles across left chest in a bandlike pattern. Completed course of acyclovir. Pain management. 4. Anemia: No obvious bleeding. Follow CBCs in a.m.  Code Status: Limited code. Family Communication: D/w daughter Ms. Georgina Quint via phone, updated care and answered questions. Disposition Plan: For surgery this evening by orthopedics. Possible skilled nursing facility for rehabilitation on discharge.   Brief narrative: 78 year old female with history of bilateral TKR, recent episode of shingles approximately 3 weeks ago, hypertension, sustained a mechanical fall at home without any associated symptoms such as dizziness, lightheadedness, chest pain, palpitations or dyspnea. Evaluation in the emergency department revealed left periprosthetic distal femoral comminuted fracture. Orthopedics was consulted and patient was admitted for further management.  Consultants:  Orthopedics.  Procedures:  None yet.  Antibiotics:  None yet.  HPI/Subjective: Left knee pain. Patient indicates that morphine is not relieving her pain adequately and is requesting IV Dilaudid. Also has intermittent burning chest pain at site of prior shingles.  Patient denies history of ischemic type of chest pain, palpitations, dyspnea.  No history of coronary artery disease or congestive heart failure. Last major surgery was in 2011 for bowel obstruction. Able to climb 2 flights of stairs without chest pain or dyspnea.  Objective: Filed Vitals:   03/30/12 1935 03/30/12 2120 03/30/12 2357 03/31/12 0547  BP: 167/77 158/71 163/94 136/81  Pulse: 87 94 85 86  Temp:   98.4 F (36.9 C) 98 F (36.7 C)  TempSrc:   Oral Oral  Resp: _0 Height:      Weight:   69.4 kg (153 lb)   SpO2: 99%  97% 98%    Intake/Output Summary (Last 24 hours) at 03/31/12 1108 Last data filed at 03/31/12 0957  Gross per 24 hour  Intake 478.75 ml  Output    200 ml  Net 278.75 ml   Filed Weights   03/30/12 1741 03/30/12 2357  Weight: 68.04 kg (150 lb) 69.4 kg (153 lb)    Exam:  General exam: Moderately built and nourished female patient was lying comfortably supine in bed and is in no obvious distress. Respiratory system: Clear to auscultation. No increased work of breathing. Healed rash of shingles in the dermatome like distribution on the left? T4 dermatome. Cardiovascular system: First and second heart sounds heard, regular rate and rhythm. No JVD, murmur, gallop or pedal edema. Gastrointestinal system: Abdomen is nondistended, soft and nontender and normal bowel sounds heard. Central nervous system: Alert and oriented x3. No focal neurological deficits. Extremities: Left lower extremity any immobilizer. Able to wiggle toes. Peripheral pulses symmetrically felt. Other limbs grade 5 x 5 power.   Data Reviewed: Basic Metabolic Panel:  Lab 04/84/98 0430 03/30/12 1850  NA 136 137  K 4.0 4.4  CL 104 101  CO2 23 23  GLUCOSE 119* 161*  BUN 26* 28*  CREATININE 1.06 0.88  CALCIUM 8.9 10.0  MG -- --  PHOS -- --  Liver Function Tests: No results found for this basename: AST:5,ALT:5,ALKPHOS:5,BILITOT:5,PROT:5,ALBUMIN:5 in the last 168 hours No results found for this basename: LIPASE:5,AMYLASE:5 in the last 168 hours No results  found for this basename: AMMONIA:5 in the last 168 hours CBC:  Lab 03/31/12 0430 03/30/12 1850  WBC 12.7* 17.9*  NEUTROABS -- 16.3*  HGB 9.4* 11.9*  HCT 28.5* 35.1*  MCV 89.6 88.6  PLT 321 384   Cardiac Enzymes:  Lab 03/30/12 1850  CKTOTAL 94  CKMB --  CKMBINDEX --  TROPONINI --   BNP (last 3 results) No results found for this basename: PROBNP:3 in the last 8760 hours CBG:  Lab 03/31/12 0822  GLUCAP 123*    Recent Results (from the past 240 hour(s))  SURGICAL PCR SCREEN     Status: Abnormal   Collection Time   03/31/12  1:10 AM      Component Value Range Status Comment   MRSA, PCR POSITIVE (*) NEGATIVE Final    Staphylococcus aureus POSITIVE (*) NEGATIVE Final      Studies: Dg Ribs Unilateral W/chest Left  03/30/2012  *RADIOLOGY REPORT*  Clinical Data: [place] today.  The patient reports no chest symptoms at this time.  LEFT RIBS AND CHEST - 3+ VIEW  Comparison: Chest dated 08/22/2009.  Findings: Normal sized heart.  Tortuous aorta.  Small hiatal hernia.  Clear lungs.  Mild diffuse peribronchial thickening and prominence of the interstitial markings.  Old, healed right rib fractures.  No acute rib fracture or pneumothorax seen.  Diffuse osteopenia.  IMPRESSION:  1.  No acute fracture. 2.  Small hiatal hernia. 3.  Mild chronic bronchitic changes.   Original Report Authenticated By: Gerald Stabs, M.D.    Dg Hip Complete Left  03/30/2012  *RADIOLOGY REPORT*  Clinical Data: Left femur pain following a fall today.  LEFT HIP - COMPLETE 2+ VIEW  Comparison: None.  Findings: Diffuse osteopenia.  No fracture or dislocation seen.  IMPRESSION: No fracture or dislocation.   Original Report Authenticated By: Gerald Stabs, M.D.    Dg Knee 1-2 Views Left  03/30/2012  *RADIOLOGY REPORT*  Clinical Data: Left femur and knee pain following a fall today.  LEFT KNEE - 1-2 VIEW  Comparison: None.  Findings: Comminuted fracture of the distal shaft and metadiaphysis of the femur with medial and  posterior angulation of the distal fragment as well as approximately one half shaft width of medial posterior displacement of the distal fragment.  There is also a medially displaced middle fragment.  A total knee prosthesis is also noted.  Diffuse osteopenia.  IMPRESSION: Comminuted distal femur fracture, as described above.   Original Report Authenticated By: Gerald Stabs, M.D.    EKG: 03/30/12:  Sinus rhythm at 84 beats per minute, normal axis, nonspecific ST-T changes but no acute changes and QTC of 449 ms.  Scheduled Meds:    .  ceFAZolin (ANCEF) IV  2 g Intravenous 60 min Pre-Op  . chlorhexidine  60 mL Topical Once  . Chlorhexidine Gluconate Cloth  6 each Topical Q0600  . doxazosin  8 mg Oral QHS  . enoxaparin (LOVENOX) injection  40 mg Subcutaneous Once  . fentaNYL      . losartan  100 mg Oral Daily   And  . hydrochlorothiazide  25 mg Oral Daily  . HYDROmorphone      .  HYDROmorphone (DILAUDID) injection  1 mg Intravenous Once  .  HYDROmorphone (DILAUDID) injection  1 mg Intravenous Once  . mupirocin ointment  1 application Nasal BID  . pantoprazole  40 mg Oral Q1200  . DISCONTD: Chlorhexidine Gluconate Cloth  6 each Topical Daily  . DISCONTD: enoxaparin (LOVENOX) injection  40 mg Subcutaneous QHS  . DISCONTD: enoxaparin (LOVENOX) injection  40 mg Subcutaneous Once  . DISCONTD:  HYDROmorphone (DILAUDID) injection  2 mg Intravenous Once  . DISCONTD: losartan-hydrochlorothiazide  1 tablet Oral Daily  . DISCONTD: mupirocin ointment  1 application Nasal BID   Continuous Infusions:    . 0.45 % NaCl with KCl 20 mEq / L 75 mL/hr at 03/31/12 0037       Time spent: 25 minutes.    [facility] Pager [phone].  If 8PM-8AM, please contact night-coverage at [email or web address], password [facility] 03/31/2012, 11:08 AM  LOS: 1 day

## 2012-03-31 NOTE — Anesthesia Preprocedure Evaluation (Signed)
Anesthesia Evaluation    Airway Mallampati: II TM Distance: >3 FB Neck ROM: Full    Dental  (+) Partial Lower and Partial Upper   Pulmonary  breath sounds clear to auscultation        Cardiovascular hypertension, Pt. on medications and Pt. on home beta blockers Rhythm:Regular Rate:Normal     Neuro/Psych    GI/Hepatic GERD-  Medicated,  Endo/Other    Renal/GU      Musculoskeletal   Abdominal   Peds  Hematology   Anesthesia Other Findings   Reproductive/Obstetrics                           Anesthesia Physical Anesthesia Plan  ASA: II and Emergent  Anesthesia Plan: General   Post-op Pain Management:    Induction: Intravenous  Airway Management Planned: Oral ETT  Additional Equipment:   Intra-op Plan:   Post-operative Plan: Extubation in OR  Informed Consent: I have reviewed the patients History and Physical, chart, labs and discussed the procedure including the risks, benefits and alternatives for the proposed anesthesia with the patient or authorized representative who has indicated his/her understanding and acceptance.   Dental advisory given  Plan Discussed with: CRNA  Anesthesia Plan Comments:         Anesthesia Quick Evaluation

## 2012-04-01 ENCOUNTER — Encounter (HOSPITAL_COMMUNITY): Payer: Self-pay | Admitting: *Deleted

## 2012-04-01 DIAGNOSIS — D62 Acute posthemorrhagic anemia: Secondary | ICD-10-CM | POA: Diagnosis not present

## 2012-04-01 DIAGNOSIS — I10 Essential (primary) hypertension: Secondary | ICD-10-CM

## 2012-04-01 DIAGNOSIS — D649 Anemia, unspecified: Secondary | ICD-10-CM

## 2012-04-01 DIAGNOSIS — Z8619 Personal history of other infectious and parasitic diseases: Secondary | ICD-10-CM

## 2012-04-01 LAB — BASIC METABOLIC PANEL
CO2: 24 mEq/L (ref 19–32)
Chloride: 100 mEq/L (ref 96–112)
Creatinine, Ser: 0.92 mg/dL (ref 0.50–1.10)
Potassium: 4.1 mEq/L (ref 3.5–5.1)
Sodium: 134 mEq/L — ABNORMAL LOW (ref 135–145)

## 2012-04-01 LAB — CBC
MCV: 91 fL (ref 78.0–100.0)
Platelets: 280 10*3/uL (ref 150–400)
RBC: 2.78 MIL/uL — ABNORMAL LOW (ref 3.87–5.11)
WBC: 14.3 10*3/uL — ABNORMAL HIGH (ref 4.0–10.5)

## 2012-04-01 MED ORDER — ACETAMINOPHEN 650 MG RE SUPP: 650.0000 mg | Freq: Four times a day (QID) | RECTAL | Status: DC | PRN

## 2012-04-01 MED ORDER — HYDROMORPHONE HCL PF 1 MG/ML IJ SOLN
0.5000 mg | INTRAMUSCULAR | Status: DC | PRN
  Administered 2012-04-01 – 2012-04-02 (×8): 2 mg via INTRAVENOUS
  Administered 2012-04-02 (×2): 1 mg via INTRAVENOUS
  Administered 2012-04-02 – 2012-04-03 (×3): 2 mg via INTRAVENOUS
  Administered 2012-04-03: 1 mg via INTRAVENOUS
  Administered 2012-04-03: 0.5 mg via INTRAVENOUS
  Administered 2012-04-03: 2 mg via INTRAVENOUS
  Filled 2012-04-01 (×9): qty 2
  Filled 2012-04-01 (×2): qty 1
  Filled 2012-04-01 (×3): qty 2
  Filled 2012-04-01: qty 1
  Filled 2012-04-01: qty 2

## 2012-04-01 MED ORDER — MENTHOL 3 MG MT LOZG
1.0000 | LOZENGE | OROMUCOSAL | Status: DC | PRN
  Filled 2012-04-01: qty 9

## 2012-04-01 MED ORDER — ONDANSETRON HCL 4 MG/2ML IJ SOLN
4.0000 mg | Freq: Four times a day (QID) | INTRAMUSCULAR | Status: DC | PRN
  Administered 2012-04-01 – 2012-04-04 (×3): 4 mg via INTRAVENOUS
  Filled 2012-04-01 (×3): qty 2

## 2012-04-01 MED ORDER — FERROUS SULFATE 325 (65 FE) MG PO TABS
325.0000 mg | ORAL_TABLET | Freq: Three times a day (TID) | ORAL | Status: DC
  Administered 2012-04-01 – 2012-04-08 (×23): 325 mg via ORAL
  Filled 2012-04-01 (×27): qty 1

## 2012-04-01 MED ORDER — NEBIVOLOL HCL 20 MG PO TABS
20.0000 mg | ORAL_TABLET | Freq: Every morning | ORAL | Status: DC
  Administered 2012-04-01 – 2012-04-08 (×8): 20 mg via ORAL
  Filled 2012-04-01 (×8): qty 1

## 2012-04-01 MED ORDER — PHENOL 1.4 % MT LIQD
1.0000 | OROMUCOSAL | Status: DC | PRN
  Filled 2012-04-01: qty 177

## 2012-04-01 MED ORDER — ACETAMINOPHEN 325 MG PO TABS: 650.0000 mg | ORAL_TABLET | Freq: Four times a day (QID) | ORAL | Status: DC | PRN

## 2012-04-01 MED ORDER — HYDROCODONE-ACETAMINOPHEN 5-325 MG PO TABS: 1.0000 | ORAL_TABLET | Freq: Four times a day (QID) | ORAL | Status: DC | PRN

## 2012-04-01 MED ORDER — ALPRAZOLAM 0.25 MG PO TABS
0.2500 mg | ORAL_TABLET | Freq: Two times a day (BID) | ORAL | Status: DC | PRN
  Administered 2012-04-01 – 2012-04-08 (×8): 0.25 mg via ORAL
  Filled 2012-04-01 (×8): qty 1

## 2012-04-01 MED ORDER — ONDANSETRON HCL 4 MG PO TABS
4.0000 mg | ORAL_TABLET | Freq: Four times a day (QID) | ORAL | Status: DC | PRN
  Administered 2012-04-01 – 2012-04-06 (×3): 4 mg via ORAL
  Filled 2012-04-01 (×3): qty 1

## 2012-04-01 MED ORDER — POLYETHYLENE GLYCOL 3350 17 G PO PACK
17.0000 g | PACK | Freq: Two times a day (BID) | ORAL | Status: DC
  Administered 2012-04-01 – 2012-04-06 (×8): 17 g via ORAL
  Filled 2012-04-01 (×16): qty 1

## 2012-04-01 MED ORDER — DOCUSATE SODIUM 100 MG PO CAPS
100.0000 mg | ORAL_CAPSULE | Freq: Two times a day (BID) | ORAL | Status: DC
  Administered 2012-04-01 – 2012-04-03 (×6): 100 mg via ORAL
  Filled 2012-04-01 (×8): qty 1

## 2012-04-01 MED ORDER — MORPHINE SULFATE 2 MG/ML IJ SOLN
0.5000 mg | INTRAMUSCULAR | Status: DC | PRN
  Administered 2012-04-01 (×2): 0.5 mg via INTRAVENOUS
  Filled 2012-04-01 (×2): qty 1

## 2012-04-01 MED ORDER — METOCLOPRAMIDE HCL 10 MG PO TABS: 5.0000 mg | ORAL_TABLET | Freq: Three times a day (TID) | ORAL | Status: DC | PRN

## 2012-04-01 MED ORDER — OXYCODONE HCL 5 MG PO TABS
5.0000 mg | ORAL_TABLET | ORAL | Status: DC | PRN
  Administered 2012-04-01 – 2012-04-02 (×3): 5 mg via ORAL
  Administered 2012-04-03: 10 mg via ORAL
  Administered 2012-04-03: 5 mg via ORAL
  Administered 2012-04-03: 10 mg via ORAL
  Administered 2012-04-04: 5 mg via ORAL
  Administered 2012-04-04 – 2012-04-05 (×4): 10 mg via ORAL
  Administered 2012-04-05: 5 mg via ORAL
  Administered 2012-04-05 – 2012-04-06 (×8): 15 mg via ORAL
  Administered 2012-04-07 (×2): 5 mg via ORAL
  Administered 2012-04-07: 10 mg via ORAL
  Administered 2012-04-07: 15 mg via ORAL
  Administered 2012-04-08: 10 mg via ORAL
  Administered 2012-04-08: 5 mg via ORAL
  Administered 2012-04-08: 10 mg via ORAL
  Administered 2012-04-08: 5 mg via ORAL
  Filled 2012-04-01: qty 3
  Filled 2012-04-01: qty 2
  Filled 2012-04-01 (×2): qty 1
  Filled 2012-04-01 (×2): qty 3
  Filled 2012-04-01: qty 1
  Filled 2012-04-01: qty 2
  Filled 2012-04-01: qty 1
  Filled 2012-04-01 (×3): qty 2
  Filled 2012-04-01 (×2): qty 1
  Filled 2012-04-01: qty 3
  Filled 2012-04-01: qty 2
  Filled 2012-04-01: qty 3
  Filled 2012-04-01: qty 1
  Filled 2012-04-01 (×2): qty 2
  Filled 2012-04-01: qty 3
  Filled 2012-04-01 (×2): qty 1
  Filled 2012-04-01 (×3): qty 3
  Filled 2012-04-01: qty 2
  Filled 2012-04-01: qty 1

## 2012-04-01 MED ORDER — METOCLOPRAMIDE HCL 5 MG/ML IJ SOLN: 5.0000 mg | Freq: Three times a day (TID) | INTRAMUSCULAR | Status: DC | PRN

## 2012-04-01 MED ORDER — ENOXAPARIN SODIUM 40 MG/0.4ML ~~LOC~~ SOLN
40.0000 mg | SUBCUTANEOUS | Status: DC
  Administered 2012-04-01 – 2012-04-07 (×7): 40 mg via SUBCUTANEOUS
  Filled 2012-04-01 (×8): qty 0.4

## 2012-04-01 MED ORDER — SODIUM CHLORIDE 0.9 % IV SOLN
INTRAVENOUS | Status: DC
  Administered 2012-04-01 – 2012-04-02 (×4): via INTRAVENOUS
  Filled 2012-04-01 (×9): qty 1000

## 2012-04-01 MED ORDER — MAGNESIUM CITRATE PO SOLN: 0.5000 | Freq: Once | ORAL | Status: AC | PRN

## 2012-04-01 MED ORDER — ACETAMINOPHEN 10 MG/ML IV SOLN
1000.0000 mg | Freq: Four times a day (QID) | INTRAVENOUS | Status: AC
  Administered 2012-04-01 – 2012-04-02 (×4): 1000 mg via INTRAVENOUS
  Filled 2012-04-01 (×4): qty 100

## 2012-04-01 MED ORDER — HYDROMORPHONE HCL PF 1 MG/ML IJ SOLN: 1.0000 mg | INTRAMUSCULAR | Status: DC | PRN

## 2012-04-01 MED ORDER — HYDROMORPHONE HCL PF 1 MG/ML IJ SOLN
1.0000 mg | INTRAMUSCULAR | Status: DC | PRN
  Administered 2012-04-01 (×2): 1 mg via INTRAVENOUS
  Filled 2012-04-01 (×2): qty 1

## 2012-04-01 MED ORDER — HYDROMORPHONE HCL PF 2 MG/ML IJ SOLN
1.5000 mg | INTRAMUSCULAR | Status: DC | PRN
  Administered 2012-04-01: 1.5 mg via INTRAVENOUS
  Filled 2012-04-01: qty 1

## 2012-04-01 MED FILL — Sodium Chloride Inj 0.9%: INTRAMUSCULAR | Qty: 10 | Status: AC

## 2012-04-01 NOTE — Progress Notes (Signed)
Physical Therapy note-- pt has been in a lot of pain and is now finally comfortable. Will initiate PT in [facility]

## 2012-04-01 NOTE — Progress Notes (Signed)
[facility] PROGRESS NOTE  [name] TZG:[id number] DOB: May 08, 1932 DOA: 03/30/2012 PCP: [name], PA  Assessment/Plan: Principal Problem:  *Femur fracture, left Active Problems:  Hypertension  Fall at home  History of shingles  Anemia  Expected blood loss anemia  1. Comminuted left periprosthetic distal femoral fracture: Status post ORIF on 8/27. Pain not adequately controlled-pain management per orthopedics. 2. Hypertension: Reasonably controlled-some fluctuation secondary to pain and discomfort.. Continue home medications-Doxazosin, Hyzaar and Nebivolol. 3. History of shingles/postherpetic neuralgia: Healed rash of shingles across left chest in a bandlike pattern. Completed course of acyclovir. Pain management. 4. Anemia: Drop in hemoglobin likely from surgery. Follow CBCs in a.m.  Code Status: Limited code. Family Communication: D/w daughter Ms. Joy at bedside, updated care and answered questions. Disposition Plan: Possible skilled nursing facility for rehabilitation on discharge.   Brief narrative: 78 year old female with history of bilateral TKR, recent episode of shingles approximately 3 weeks ago, hypertension, sustained a mechanical fall at home without any associated symptoms such as dizziness, lightheadedness, chest pain, palpitations or dyspnea. Evaluation in the emergency department revealed left periprosthetic distal femoral comminuted fracture. Orthopedics was consulted and patient was admitted for further management.  Consultants:  Orthopedics.  Procedures:  ORIF left femur on 8/27  Antibiotics:  None yet.  HPI/Subjective: Significant left knee pain which was not relieved by current doses of pain medications. Mild nausea but no vomiting. Passing flatus.  Objective: Filed Vitals:   04/01/12 0758 04/01/12 1139 04/01/12 1300 04/01/12 1520  BP:   129/79   Pulse:   89   Temp:   98.9 F (37.2 C)   TempSrc:      Resp: _0 Height:       Weight:      SpO2: 98% 98% 99% 98%    Intake/Output Summary (Last 24 hours) at 04/01/12 1818 Last data filed at 04/01/12 1600  Gross per 24 hour  Intake 4698.33 ml  Output   1575 ml  Net 3123.33 ml   Filed Weights   03/30/12 1741 03/30/12 2357  Weight: 68.04 kg (150 lb) 69.4 kg (153 lb)    Exam:  General exam: Moderately built and nourished female patient in painful distress.  Respiratory system: Clear to auscultation. No increased work of breathing. Healed rash of shingles in the dermatome like distribution on the left? T4 dermatome. Cardiovascular system: First and second heart sounds heard, regular rate and rhythm. No JVD, murmur, gallop or pedal edema. Telemetry shows sinus tachycardia in the 100s. Gastrointestinal system: Abdomen is nondistended, soft and nontender and normal bowel sounds heard. Central nervous system: Alert and oriented x3. No focal neurological deficits. Extremities: Left knee site dressing clean dry and intact.. Able to wiggle toes. Peripheral pulses symmetrically felt. Other limbs grade 5 x 5 power.   Data Reviewed: Basic Metabolic Panel:  Lab 75/91/63 0413 03/31/12 0430 03/30/12 1850  NA 134* 136 137  K 4.1 4.0 4.4  CL 100 104 101  CO2 _1 GLUCOSE 130* 119* 161*  BUN 15 26* 28*  CREATININE 0.92 1.06 0.88  CALCIUM 8.6 8.9 10.0  MG -- -- --  PHOS -- -- --   Liver Function Tests: No results found for this basename: AST:5,ALT:5,ALKPHOS:5,BILITOT:5,PROT:5,ALBUMIN:5 in the last 168 hours No results found for this basename: LIPASE:5,AMYLASE:5 in the last 168 hours No results found for this basename: AMMONIA:5 in the last 168 hours CBC:  Lab 04/01/12 0413 03/31/12 0430 03/30/12 1850  WBC 14.3* 12.7* 17.9*  NEUTROABS -- -- 16.3*  HGB 8.3* 9.4* 11.9*  HCT 25.3* 28.5* 35.1*  MCV 91.0 89.6 88.6  PLT 280 321 384   Cardiac Enzymes:  Lab 03/30/12 1850  CKTOTAL 94  CKMB --  CKMBINDEX --  TROPONINI --   BNP (last 3 results) No results  found for this basename: PROBNP:3 in the last 8760 hours CBG:  Lab 03/31/12 1643 03/31/12 0822  GLUCAP 129* 123*    Recent Results (from the past 240 hour(s))  SURGICAL PCR SCREEN     Status: Abnormal   Collection Time   03/31/12  1:10 AM      Component Value Range Status Comment   MRSA, PCR POSITIVE (*) NEGATIVE Final    Staphylococcus aureus POSITIVE (*) NEGATIVE Final      Studies: Dg Ribs Unilateral W/chest Left  03/30/2012  *RADIOLOGY REPORT*  Clinical Data: [place] today.  The patient reports no chest symptoms at this time.  LEFT RIBS AND CHEST - 3+ VIEW  Comparison: Chest dated 08/22/2009.  Findings: Normal sized heart.  Tortuous aorta.  Small hiatal hernia.  Clear lungs.  Mild diffuse peribronchial thickening and prominence of the interstitial markings.  Old, healed right rib fractures.  No acute rib fracture or pneumothorax seen.  Diffuse osteopenia.  IMPRESSION:  1.  No acute fracture. 2.  Small hiatal hernia. 3.  Mild chronic bronchitic changes.   Original Report Authenticated By: Gerald Stabs, M.D.    Dg Hip Complete Left  03/30/2012  *RADIOLOGY REPORT*  Clinical Data: Left femur pain following a fall today.  LEFT HIP - COMPLETE 2+ VIEW  Comparison: None.  Findings: Diffuse osteopenia.  No fracture or dislocation seen.  IMPRESSION: No fracture or dislocation.   Original Report Authenticated By: Gerald Stabs, M.D.    Dg Knee 1-2 Views Left  03/30/2012  *RADIOLOGY REPORT*  Clinical Data: Left femur and knee pain following a fall today.  LEFT KNEE - 1-2 VIEW  Comparison: None.  Findings: Comminuted fracture of the distal shaft and metadiaphysis of the femur with medial and posterior angulation of the distal fragment as well as approximately one half shaft width of medial posterior displacement of the distal fragment.  There is also a medially displaced middle fragment.  A total knee prosthesis is also noted.  Diffuse osteopenia.  IMPRESSION: Comminuted distal femur fracture, as  described above.   Original Report Authenticated By: Gerald Stabs, M.D.    EKG: 03/30/12:  Sinus rhythm at 84 beats per minute, normal axis, nonspecific ST-T changes but no acute changes and QTC of 449 ms.  Scheduled Meds:    .  ceFAZolin (ANCEF) IV  2 g Intravenous 60 min Pre-Op  . Chlorhexidine Gluconate Cloth  6 each Topical Q0600  . docusate sodium  100 mg Oral BID  . doxazosin  8 mg Oral QHS  . enoxaparin (LOVENOX) injection  40 mg Subcutaneous Q24H  . ferrous sulfate  325 mg Oral TID PC  . hydrALAZINE  5 mg Intravenous Once  . losartan  100 mg Oral Daily   And  . hydrochlorothiazide  25 mg Oral Daily  . mupirocin ointment  1 application Nasal BID  . Nebivolol HCl  20 mg Oral q morning - 10a  . pantoprazole  40 mg Oral Q1200  . polyethylene glycol  17 g Oral BID   Continuous Infusions:    . lactated ringers    . lactated ringers    . sodium chloride 0.9 % 1,000 mL  with potassium chloride 10 mEq infusion 100 mL/hr at 04/01/12 1123  . DISCONTD: 0.45 % NaCl with KCl 20 mEq / L 75 mL/hr at 03/31/12 1537       Time spent: 25 minutes.    [facility] Pager [phone].  If 8PM-8AM, please contact night-coverage at [email or web address], password [email or web address] 04/01/2012, 6:18 PM  LOS: 2 days

## 2012-04-01 NOTE — Op Note (Signed)
NAMESKYLEIGH, WINDLE                ACCOUNT NO.:  [id number]  MEDICAL RECORD NO.:  [id number]  LOCATION:  2355                         FACILITY:  [facility]  PHYSICIAN:  Pietro Cassis. Alvan Dame, M.D.  DATE OF BIRTH:  1932/06/03  DATE OF PROCEDURE:  03/31/2012 DATE OF DISCHARGE:                              OPERATIVE REPORT   PREOPERATIVE DIAGNOSIS:  Comminuted left distal periprosthetic femur fracture.  POSTOPERATIVE DIAGNOSIS:  Comminuted left distal periprosthetic femur fracture.  PROCEDURE:  Open reduction and internal fixation of left distal femur fracture utilizing a [employer] POLYAX plate 9 holes, 5 distal screws and 4 proximal screws Bicortical utilizing two 16-gauge wires for initial stabilization of two larger fragments of the comminuted segments.  SURGEON:  Pietro Cassis. Alvan Dame, MD  ASSISTANT:  Danae Orleans, PA  Note that Mr. Guinevere Scarlet was present for the entirety of the case, critical for management of the lower extremity from the traction, maintenance of reduction, and general facilitation of the case as well as primary wound closure.  ANESTHESIA:  General.  SPECIMEN:  None.  DRAINS:  None.  COMPLICATIONS:  None.  INDICATIONS FOR THE PROCEDURE:  Ms. Dimiceli is a 78 year old female who presented to the hospital yesterday after a fall at home.  She usually is supervised by family, but had a fall at home, sustained this fracture.  She had a previous total knee replacement approximately 10-12 years ago by one of my partners, Dr. Esmond Plants.  She was brought to the emergency room where radiographs revealed a comminuted distal femur fracture.  One of my partners, Dr. Weber Cooks was on-call and was initially involved with consultation.  He asked for definitive management with assistants.  After reviewing the radiographs, risks and benefits were reviewed and necessity of the fracture was evident.  Consent was obtained after reviewing the factors of nonunion, need for future  surgeries, infection, and DVT.  PROCEDURE IN DETAIL:  The patient was brought to the operative theater. Once adequate anesthesia, preoperative antibiotics, Ancef administered, the patient was positioned supine with a slight bump underneath the left hip.  The left lower extremity was then prepped and draped in sterile fashion from the ankle to the groin.  A time-out was performed identifying the patient, planned procedure, and extremity.  Once this was done, a lateral based incision was made on the lateral femur and thigh.  Sharp dissection was carried to the iliotibial band, which was then incised.  The fracture hematoma was evacuated.  The femur was exposed at this point to elevating the vastus lateralis off the lateral intermuscular septum, cauterizing blood vessels as necessary.  With the fracture identified, the combination was readily evident.  The metaphyseal segment was the large segment extending into the diaphyseal segment.  I was able to reduce this and hold it and reduced it using a bone clamp and then placed two 16-gauge wires around this, provided provisional fixation of this larger segment.  This then allowed with traction to reduce the fracture further.  I selected a 9-hole plate based on sizing under fluoroscopic imaging. The 9-hole plate was placed onto the jig with traction applied.  The plate was held laterally.  We then clamped  and holding together the fracture segments by reducing the metaphyseal segment to the diaphyseal segment.  With the traction being maintained and the fracture reduced anatomically, I did place one screw bicortically and proximally.  With these screws placed, now had spanning fixation and the traction could be released with the fracture reduction being maintained.  This was confirmed radiographically.  With this in place, I went ahead and used a [place] clamp and further reduced the plate to the bone distally.  I then placed the  8.0-mm screw in the center portion of the distal portion of POLYAX plate.  At this point, the fracture was reduced anatomically.  I went ahead and placed three more bicortical nonlocking screws proximally further locking and securing the plate to the bone.  I then placed four other screws into the distal portion of the plate.  I then placed a final screw that went from the plate laterally and was able to capture the medial metaphyseal flare, now acts as a lag screw to hold this metaphyseal segment in place.  At this point, final radiographs were obtained in AP and lateral planes. I was very satisfied with the overall reduction at this point.  We irrigated out the wound.  Final pictures were obtained in AP and laterally.  I then reapproximated the iliotibial band using #1 Vicryl and 0 V-Loc suture.  The remainder of the wound was closed with 2-0 Vicryl and staples on the skin.  The skin was cleaned, dried and dressed sterilely using a bulky sterile wrap.  The patient's knee was placed in knee immobilizer.  She was then brought to the recovery room, extubated in stable condition, tolerating the procedure well.     Pietro Cassis Alvan Dame, M.D.     MDO/MEDQ  D:  03/31/2012  T:  04/01/2012  Job:  [id number]

## 2012-04-01 NOTE — Progress Notes (Signed)
Subjective: 1 Day Post-Op Procedure(s) (LRB): OPEN REDUCTION INTERNAL FIXATION (ORIF) PERIPROSTHETIC FRACTURE (Left)   Patient reports pain as severe, pain not well controlled. Pain with any motion of the left leg. Other than pain, no events throughout the night.  Objective:   VITALS:   Filed Vitals:   04/01/12 1300  BP: 129/79  Pulse: 89  Temp: 98.9 F (37.2 C)  Resp: 17    Neurovascular intact Dorsiflexion/Plantar flexion intact Incision: dressing C/D/I  LABS  Basename 04/01/12 0413 03/31/12 0430 03/30/12 1850  HGB 8.3* 9.4* 11.9*  HCT 25.3* 28.5* 35.1*  WBC 14.3* 12.7* 17.9*  PLT 280 321 384     Basename 04/01/12 0413 03/31/12 0430 03/30/12 1850  NA 134* 136 137  K 4.1 4.0 4.4  BUN 15 26* 28*  CREATININE 0.92 1.06 0.88  GLUCOSE 130* 119* 161*     Assessment/Plan: 1 Day Post-Op Procedure(s) (LRB): OPEN REDUCTION INTERNAL FIXATION (ORIF) PERIPROSTHETIC FRACTURE (Left) Advance diet Up with therapy Changed from Norco to Oxycodone. Restarted her home medication of Xanax   ABLA Treated with oral iron and observation at this time    West Pugh. Mackinzee Roszak   PAC  04/01/2012, 1:58 PM

## 2012-04-02 ENCOUNTER — Encounter (HOSPITAL_COMMUNITY): Payer: Self-pay | Admitting: Orthopedic Surgery

## 2012-04-02 DIAGNOSIS — D62 Acute posthemorrhagic anemia: Secondary | ICD-10-CM

## 2012-04-02 LAB — BASIC METABOLIC PANEL
CO2: 24 mEq/L (ref 19–32)
Chloride: 100 mEq/L (ref 96–112)
Glucose, Bld: 100 mg/dL — ABNORMAL HIGH (ref 70–99)
Potassium: 3.4 mEq/L — ABNORMAL LOW (ref 3.5–5.1)
Sodium: 133 mEq/L — ABNORMAL LOW (ref 135–145)

## 2012-04-02 LAB — CBC
Hemoglobin: 6.7 g/dL — CL (ref 12.0–15.0)
Platelets: 219 10*3/uL (ref 150–400)
RBC: 2.19 MIL/uL — ABNORMAL LOW (ref 3.87–5.11)
WBC: 11.9 10*3/uL — ABNORMAL HIGH (ref 4.0–10.5)

## 2012-04-02 LAB — HEMOGLOBIN AND HEMATOCRIT, BLOOD
HCT: 30.9 % — ABNORMAL LOW (ref 36.0–46.0)
Hemoglobin: 10.5 g/dL — ABNORMAL LOW (ref 12.0–15.0)

## 2012-04-02 LAB — PREPARE RBC (CROSSMATCH)

## 2012-04-02 MED ORDER — SODIUM CHLORIDE 0.9 % IV SOLN
INTRAVENOUS | Status: DC
  Administered 2012-04-03 – 2012-04-05 (×5): via INTRAVENOUS
  Administered 2012-04-06: 75 mL/h via INTRAVENOUS
  Administered 2012-04-07: 07:00:00 via INTRAVENOUS

## 2012-04-02 MED ORDER — ACETAMINOPHEN 325 MG PO TABS
650.0000 mg | ORAL_TABLET | Freq: Four times a day (QID) | ORAL | Status: DC | PRN
  Administered 2012-04-03 – 2012-04-07 (×6): 650 mg via ORAL
  Filled 2012-04-02 (×7): qty 2

## 2012-04-02 MED ORDER — POTASSIUM CHLORIDE CRYS ER 20 MEQ PO TBCR
20.0000 meq | EXTENDED_RELEASE_TABLET | Freq: Once | ORAL | Status: AC
  Administered 2012-04-02: 20 meq via ORAL
  Filled 2012-04-02: qty 1

## 2012-04-02 NOTE — Progress Notes (Signed)
Subjective: 2 Days Post-Op Procedure(s) (LRB): OPEN REDUCTION INTERNAL FIXATION (ORIF) PERIPROSTHETIC FRACTURE (Left)   Patient reports pain as moderate, she states that her pain is under much better control. Her hgb was low, an order was placed to transfuse 2 unit of blood. No other events throughout the night.  Objective:   VITALS:   Filed Vitals:   04/02/12 1239  BP: 111/69  Pulse: 80  Temp: 98.8 F (37.1 C)  Resp: 18    Neurovascular intact Dorsiflexion/Plantar flexion intact Incision: dressing C/D/I No cellulitis present Compartment soft  LABS  Basename 04/02/12 0405 04/01/12 0413 03/31/12 0430  HGB 6.7* 8.3* 9.4*  HCT 20.0* 25.3* 28.5*  WBC 11.9* 14.3* 12.7*  PLT 219 280 321     Basename 04/02/12 0405 04/01/12 0413 03/31/12 0430  NA 133* 134* 136  K 3.4* 4.1 4.0  BUN 14 15 26*  CREATININE 1.06 0.92 1.06  GLUCOSE 100* 130* 119*     Assessment/Plan: 2 Days Post-Op Procedure(s) (LRB): OPEN REDUCTION INTERNAL FIXATION (ORIF) PERIPROSTHETIC FRACTURE (Left) Up with therapy Dressing was taken down, 2-3 tiny blood spots on petroleum dressing, no drainage on the guaze or ABDs. New dressing applied with 4x4 guaze and tape, will be changed daily. Continued use of ice to help reduce swelling.  ABLA Being treated with transfusion of blod   West Pugh. Lillee Mooneyhan   PAC  04/02/2012, 1:42 PM

## 2012-04-02 NOTE — Progress Notes (Signed)
OT Cancellation Note  Treatment cancelled today due to medical issues with patient which prohibited therapy. Pt with hgb of 6.7. To receive blood. Will check back another time.  Jowanda Heeg A OTR/L [phone] 04/02/2012, 2:27 PM

## 2012-04-02 NOTE — Progress Notes (Signed)
Physical Therapy cancellation note-  Pt states she is getting better pain control but does not want to try to get up today. Pt is receiving 2 units of blood  Today. Will initiate in AM. Tresa Endo PT [phone]

## 2012-04-02 NOTE — Progress Notes (Signed)
Pt with hgb of 6.7 called on mid level awaiting call back.

## 2012-04-02 NOTE — Progress Notes (Signed)
[facility] PROGRESS NOTE  [name] BPZ:[id number] DOB: [birth year] DOA: 03/30/2012 PCP: [name], PA  Assessment/Plan: Principal Problem:  *Femur fracture, left Active Problems:  Hypertension  Fall at home  History of shingles  Anemia  Acute blood loss anemia  1. Comminuted left periprosthetic distal femoral fracture: Status post ORIF on 8/27. Management per orthopedics. Pain is better controlled. 2. Acute blood loss anemia: Improved after 2 units of PRBCs. Follow CBCs tomorrow. 3. Hypertension: Reasonably controlled-some fluctuation secondary to pain and discomfort.. Continue home medications-Doxazosin, Hyzaar and Nebivolol. 4. History of shingles/postherpetic neuralgia: Healed rash of shingles across left chest in a bandlike pattern. Completed course of acyclovir. Pain management.  Code Status: Limited code. Family Communication: D/w daughter Ms. Joy at bedside, updated care and answered questions on 8/28. Disposition Plan: Possible skilled nursing facility for rehabilitation on discharge early next week.   Brief narrative: 78 year old female with history of bilateral TKR, recent episode of shingles approximately 3 weeks ago, hypertension, sustained a mechanical fall at home without any associated symptoms such as dizziness, lightheadedness, chest pain, palpitations or dyspnea. Evaluation in the emergency department revealed left periprosthetic distal femoral comminuted fracture. Orthopedics was consulted and patient was admitted for further management.  Consultants:  Orthopedics.  Procedures:  ORIF left femur on 8/27  Antibiotics:  None yet.  HPI/Subjective: Patient indicates that she's feeling much better today. Left knee pain is better controlled and rates it at 6/10. Denies dyspnea or chest pain. Tolerated lunch. Passing flatus.  Objective: Filed Vitals:   04/02/12 1600 04/02/12 1729 04/02/12 1848 04/02/12 1849  BP:      Pulse:      Temp:        TempSrc:      Resp: 16     Height:      Weight:      SpO2: 98% 96% 91% 97%   temperature 98.4, pulse 69 per minute, respirations 16, blood pressure 123/74 mmHg and saturating at 97%  Intake/Output Summary (Last 24 hours) at 04/02/12 2014 Last data filed at 04/02/12 1800  Gross per 24 hour  Intake 3482.5 ml  Output   1850 ml  Net 1632.5 ml   Filed Weights   03/30/12 1741 03/30/12 2357  Weight: 68.04 kg (150 lb) 69.4 kg (153 lb)    Exam:  General exam: Moderately built and nourished female patient looks comfortable today.Marland Kitchen  Respiratory system: Clear to auscultation. No increased work of breathing. Healed rash of shingles in the dermatome like distribution on the left? T4 dermatome. Cardiovascular system: First and second heart sounds heard, regular rate and rhythm. No JVD, murmur, gallop or pedal edema. Telemetry shows sinus rhythm. Gastrointestinal system: Abdomen is nondistended, soft and nontender and normal bowel sounds heard. Central nervous system: Alert and oriented x3. No focal neurological deficits. Extremities: Left knee site dressing clean dry and intact.. Able to wiggle toes. Peripheral pulses symmetrically felt. Other limbs grade 5 x 5 power.   Data Reviewed: Basic Metabolic Panel:  Lab 24/23/53 0405 04/01/12 0413 03/31/12 0430 03/30/12 1850  NA 133* 134* 136 137  K 3.4* 4.1 4.0 4.4  CL 100 100 104 101  CO2 _0 GLUCOSE 100* 130* 119* 161*  BUN 14 15 26* 28*  CREATININE 1.06 0.92 1.06 0.88  CALCIUM 8.1* 8.6 8.9 10.0  MG -- -- -- --  PHOS -- -- -- --   Liver Function Tests: No results found for this basename: AST:5,ALT:5,ALKPHOS:5,BILITOT:5,PROT:5,ALBUMIN:5 in the last 168 hours No results  found for this basename: LIPASE:5,AMYLASE:5 in the last 168 hours No results found for this basename: AMMONIA:5 in the last 168 hours CBC:  Lab 04/02/12 1755 04/02/12 0405 04/01/12 0413 03/31/12 0430 03/30/12 1850  WBC -- 11.9* 14.3* 12.7* 17.9*  NEUTROABS --  -- -- -- 16.3*  HGB 10.5* 6.7* 8.3* 9.4* 11.9*  HCT 30.9* 20.0* 25.3* 28.5* 35.1*  MCV -- 91.3 91.0 89.6 88.6  PLT -- 219 280 321 384   Cardiac Enzymes:  Lab 03/30/12 1850  CKTOTAL 94  CKMB --  CKMBINDEX --  TROPONINI --   BNP (last 3 results) No results found for this basename: PROBNP:3 in the last 8760 hours CBG:  Lab 03/31/12 1643 03/31/12 0822  GLUCAP 129* 123*    Recent Results (from the past 240 hour(s))  SURGICAL PCR SCREEN     Status: Abnormal   Collection Time   03/31/12  1:10 AM      Component Value Range Status Comment   MRSA, PCR POSITIVE (*) NEGATIVE Final    Staphylococcus aureus POSITIVE (*) NEGATIVE Final      Studies: Dg Ribs Unilateral W/chest Left  03/30/2012  *RADIOLOGY REPORT*  Clinical Data: [place] today.  The patient reports no chest symptoms at this time.  LEFT RIBS AND CHEST - 3+ VIEW  Comparison: Chest dated 08/22/2009.  Findings: Normal sized heart.  Tortuous aorta.  Small hiatal hernia.  Clear lungs.  Mild diffuse peribronchial thickening and prominence of the interstitial markings.  Old, healed right rib fractures.  No acute rib fracture or pneumothorax seen.  Diffuse osteopenia.  IMPRESSION:  1.  No acute fracture. 2.  Small hiatal hernia. 3.  Mild chronic bronchitic changes.   Original Report Authenticated By: Gerald Stabs, M.D.    Dg Hip Complete Left  03/30/2012  *RADIOLOGY REPORT*  Clinical Data: Left femur pain following a fall today.  LEFT HIP - COMPLETE 2+ VIEW  Comparison: None.  Findings: Diffuse osteopenia.  No fracture or dislocation seen.  IMPRESSION: No fracture or dislocation.   Original Report Authenticated By: Gerald Stabs, M.D.    Dg Knee 1-2 Views Left  03/30/2012  *RADIOLOGY REPORT*  Clinical Data: Left femur and knee pain following a fall today.  LEFT KNEE - 1-2 VIEW  Comparison: None.  Findings: Comminuted fracture of the distal shaft and metadiaphysis of the femur with medial and posterior angulation of the distal fragment as  well as approximately one half shaft width of medial posterior displacement of the distal fragment.  There is also a medially displaced middle fragment.  A total knee prosthesis is also noted.  Diffuse osteopenia.  IMPRESSION: Comminuted distal femur fracture, as described above.   Original Report Authenticated By: Gerald Stabs, M.D.    EKG: 03/30/12:  Sinus rhythm at 84 beats per minute, normal axis, nonspecific ST-T changes but no acute changes and QTC of 449 ms.  Scheduled Meds:    . acetaminophen  1,000 mg Intravenous Q6H  . Chlorhexidine Gluconate Cloth  6 each Topical Q0600  . docusate sodium  100 mg Oral BID  . doxazosin  8 mg Oral QHS  . enoxaparin (LOVENOX) injection  40 mg Subcutaneous Q24H  . ferrous sulfate  325 mg Oral TID PC  . losartan  100 mg Oral Daily   And  . hydrochlorothiazide  25 mg Oral Daily  . mupirocin ointment  1 application Nasal BID  . Nebivolol HCl  20 mg Oral q morning - 10a  . pantoprazole  40 mg Oral Q1200  . polyethylene glycol  17 g Oral BID   Continuous Infusions:    . lactated ringers    . lactated ringers    . sodium chloride 0.9 % 1,000 mL with potassium chloride 10 mEq infusion 100 mL/hr at 04/02/12 1219       Time spent: 20 minutes.    [facility] Pager [phone].  If 8PM-8AM, please contact night-coverage at [email or web address], password [facility] 04/02/2012, 8:14 PM  LOS: 3 days

## 2012-04-03 ENCOUNTER — Inpatient Hospital Stay (HOSPITAL_COMMUNITY): Payer: Medicare Other

## 2012-04-03 DIAGNOSIS — R509 Fever, unspecified: Secondary | ICD-10-CM

## 2012-04-03 LAB — CBC WITH DIFFERENTIAL/PLATELET
Hemoglobin: 9.6 g/dL — ABNORMAL LOW (ref 12.0–15.0)
Lymphs Abs: 1.4 10*3/uL (ref 0.7–4.0)
Monocytes Relative: 11 % (ref 3–12)
Neutro Abs: 10.1 10*3/uL — ABNORMAL HIGH (ref 1.7–7.7)
Neutrophils Relative %: 76 % (ref 43–77)
RBC: 3.15 MIL/uL — ABNORMAL LOW (ref 3.87–5.11)

## 2012-04-03 LAB — BASIC METABOLIC PANEL
BUN: 12 mg/dL (ref 6–23)
Chloride: 102 mEq/L (ref 96–112)
Glucose, Bld: 126 mg/dL — ABNORMAL HIGH (ref 70–99)
Potassium: 4 mEq/L (ref 3.5–5.1)

## 2012-04-03 MED ORDER — LOSARTAN POTASSIUM-HCTZ 100-25 MG PO TABS: 1.0000 | ORAL_TABLET | Freq: Every day | ORAL | Status: DC

## 2012-04-03 NOTE — Progress Notes (Signed)
[facility] PROGRESS NOTE  [name] QVZ:[id number] DOB: 01/28/1932 DOA: 03/30/2012 PCP: [name], PA  Assessment/Plan: Principal Problem:  *Femur fracture, left S/p ORIF on 8/27. Ortho following. As per recommendations,  NWB left leg. Knee immobilizer only need for comfort, otherwise can be dced. Daily dressing changes with 4x4 guaze and tape  Recommend on  on Lovenox for 2 weeks, then ASA 347m bid for 4 weeks  -Oxycodone prn and Robaxin for  muscle spasms  - MiraLax and Colace bid  -Iron 3259mtid  Follow up with [facility] 2 weeks  Active Problems:  Hypertension Resume home meds   fever on 8/29  unexplained. Mild leucocytosis as well/  pancx sent. CXR negative. Will follow D/c foley    Acute blood loss anemia H&H stable after PRBC. Cont iron supplements  History of shingles/postherpetic neuralgia:  Healed rash of shingles across left chest in a bandlike pattern. Completed course of acyclovir. Pain management.  Code Status:partial Family Communication: grandson at bedside Disposition Plan: to SNF likely on 9/3   Brief narrative: [age]ear old female with history of bilateral TKR, recent episode of shingles approximately 3 weeks ago, hypertension, sustained a mechanical fall at home without any associated symptoms such as dizziness, lightheadedness, chest pain, palpitations or dyspnea. Evaluation in the emergency department revealed left periprosthetic distal femoral comminuted fracture. Orthopedics was consulted and patient was admitted for further management.      Consultants:  Dr OlAlvan DameProcedures:  ORIF on 8/27  Antibiotics:  none  HPI/Subjective: Feels tired and  c/o left leg pain. Had temp spike of 102.3 overnight. Denies chest pain , SOB or abdominal pain. Denies bowel or urinary symptoms.   Objective: Filed Vitals:   04/02/12 1849 04/02/12 2333 04/03/12 0033 04/03/12 0553  BP:  176/67  128/77  Pulse:  95  109  Temp:   102.3 F (39.1 C) 100.1 F (37.8 C) 98.3 F (36.8 C)  TempSrc:  Oral Oral Oral  Resp:  16  16  Height:      Weight:      SpO2: 97% 97%      Intake/Output Summary (Last 24 hours) at 04/03/12 1546 Last data filed at 04/03/12 0912  Gross per 24 hour  Intake 998.75 ml  Output    350 ml  Net 648.75 ml   Filed Weights   03/30/12 1741 03/30/12 2357  Weight: 68.04 kg (150 lb) 69.4 kg (153 lb)    Exam:   General:  Elderly female in NAD  HEENT: no pallor moist oral mucosa  Cardiovascular: NS1&S2, no murmurs  Respiratory: clear b/l, no added sounds  Abdomen: soft, NT, ND, BS+  Ext: Warm, left knee immobilizer, dressing over surgical site intact  CNS: AAOX 3   Data Reviewed: Basic Metabolic Panel:  Lab 0832/95/18025 04/02/12 0405 04/01/12 0413 03/31/12 0430 03/30/12 1850  NA 133* 133* 134* 136 137  K 4.0 3.4* 4.1 4.0 4.4  CL 102 100 100 104 101  CO2 _0 GLUCOSE 126* 100* 130* 119* 161*  BUN _1 26* 28*  CREATININE 0.75 1.06 0.92 1.06 0.88  CALCIUM 8.3* 8.1* 8.6 8.9 10.0  MG -- -- -- -- --  PHOS -- -- -- -- --   Liver Function Tests: No results found for this basename: AST:5,ALT:5,ALKPHOS:5,BILITOT:5,PROT:5,ALBUMIN:5 in the last 168 hours No results found for this basename: LIPASE:5,AMYLASE:5 in the last 168 hours No results found for this basename: AMMONIA:5 in the last 168 hours  CBC:  Lab 04/03/12 0025 04/02/12 1755 04/02/12 0405 04/01/12 0413 03/31/12 0430 03/30/12 1850  WBC 13.2* -- 11.9* 14.3* 12.7* 17.9*  NEUTROABS 10.1* -- -- -- -- 16.3*  HGB 9.6* 10.5* 6.7* 8.3* 9.4* --  HCT 28.1* 30.9* 20.0* 25.3* 28.5* --  MCV 89.2 -- 91.3 91.0 89.6 88.6  PLT 211 -- 219 280 321 384   Cardiac Enzymes:  Lab 03/30/12 1850  CKTOTAL 94  CKMB --  CKMBINDEX --  TROPONINI --   BNP (last 3 results) No results found for this basename: PROBNP:3 in the last 8760 hours CBG:  Lab 03/31/12 1643 03/31/12 0822  GLUCAP 129* 123*    Recent Results (from  the past 240 hour(s))  SURGICAL PCR SCREEN     Status: Abnormal   Collection Time   03/31/12  1:10 AM      Component Value Range Status Comment   MRSA, PCR POSITIVE (*) NEGATIVE Final    Staphylococcus aureus POSITIVE (*) NEGATIVE Final      Studies: Dg Ribs Unilateral W/chest Left  03/30/2012  *RADIOLOGY REPORT*  Clinical Data: [place] today.  The patient reports no chest symptoms at this time.  LEFT RIBS AND CHEST - 3+ VIEW  Comparison: Chest dated 08/22/2009.  Findings: Normal sized heart.  Tortuous aorta.  Small hiatal hernia.  Clear lungs.  Mild diffuse peribronchial thickening and prominence of the interstitial markings.  Old, healed right rib fractures.  No acute rib fracture or pneumothorax seen.  Diffuse osteopenia.  IMPRESSION:  1.  No acute fracture. 2.  Small hiatal hernia. 3.  Mild chronic bronchitic changes.   Original Report Authenticated By: Gerald Stabs, M.D.    Dg Hip Complete Left  03/30/2012  *RADIOLOGY REPORT*  Clinical Data: Left femur pain following a fall today.  LEFT HIP - COMPLETE 2+ VIEW  Comparison: None.  Findings: Diffuse osteopenia.  No fracture or dislocation seen.  IMPRESSION: No fracture or dislocation.   Original Report Authenticated By: Gerald Stabs, M.D.    Dg Femur Left  03/31/2012  *RADIOLOGY REPORT*  Clinical Data: Fracture fixation  DG C-ARM 61-120 MIN - NRPT [facility], LEFT FEMUR - 2 VIEW  Comparison: Plain films 03/30/2012.  Findings: We are provided with six fluoroscopic spot views of the left femur.  Images demonstrate placement of a plate and screws and a cerclage wire for fixation of a distal femur fracture.  Position and alignment appear near anatomic.  Knee replacement noted.  IMPRESSION: ORIF distal left femur fracture.   Original Report Authenticated By: Arvid Right. D'ALESSIO, M.D.    Dg Knee 1-2 Views Left  03/30/2012  *RADIOLOGY REPORT*  Clinical Data: Left femur and knee pain following a fall today.  LEFT KNEE - 1-2 VIEW  Comparison: None.  Findings:  Comminuted fracture of the distal shaft and metadiaphysis of the femur with medial and posterior angulation of the distal fragment as well as approximately one half shaft width of medial posterior displacement of the distal fragment.  There is also a medially displaced middle fragment.  A total knee prosthesis is also noted.  Diffuse osteopenia.  IMPRESSION: Comminuted distal femur fracture, as described above.   Original Report Authenticated By: Gerald Stabs, M.D.    Dg Chest Port 1 View  04/03/2012  *RADIOLOGY REPORT*  Clinical Data: Postoperative fever.  PORTABLE CHEST - 1 VIEW  Comparison: Plain film chest 03/30/2012.  Findings: Lung volumes are low with basilar atelectasis.  There appear to be very small bilateral pleural  effusions.  Heart size is normal.  No pneumothorax.  IMPRESSION: Mild bibasilar atelectasis in a low-volume chest.  Very small bilateral pleural effusions are identified.   Original Report Authenticated By: Arvid Right. D'ALESSIO, M.D.    Dg C-arm 61-120 Min-no Report  03/31/2012  *RADIOLOGY REPORT*  Clinical Data: Fracture fixation  DG C-ARM 61-120 MIN - NRPT [facility], LEFT FEMUR - 2 VIEW  Comparison: Plain films 03/30/2012.  Findings: We are provided with six fluoroscopic spot views of the left femur.  Images demonstrate placement of a plate and screws and a cerclage wire for fixation of a distal femur fracture.  Position and alignment appear near anatomic.  Knee replacement noted.  IMPRESSION: ORIF distal left femur fracture.   Original Report Authenticated By: Arvid Right. D'ALESSIO, M.D.     Scheduled Meds:   . Chlorhexidine Gluconate Cloth  6 each Topical Q0600  . docusate sodium  100 mg Oral BID  . doxazosin  8 mg Oral QHS  . enoxaparin (LOVENOX) injection  40 mg Subcutaneous Q24H  . ferrous sulfate  325 mg Oral TID PC  . losartan  100 mg Oral Daily   And  . hydrochlorothiazide  25 mg Oral Daily  . mupirocin ointment  1 application Nasal BID  . Nebivolol HCl  20 mg Oral q  morning - 10a  . pantoprazole  40 mg Oral Q1200  . polyethylene glycol  17 g Oral BID  . potassium chloride  20 mEq Oral Once   Continuous Infusions:   . sodium chloride 75 mL/hr at 04/03/12 0054  . DISCONTD: lactated ringers    . DISCONTD: lactated ringers    . DISCONTD: sodium chloride 0.9 % 1,000 mL with potassium chloride 10 mEq infusion 100 mL/hr at 04/02/12 1219       Time spent: 30 minutes    Louellen Molder  [facility] Pager [phone] If 8PM-8AM, please contact night-coverage at [email or web address], password [facility] 04/03/2012, 3:46 PM  LOS: 4 days

## 2012-04-03 NOTE — Evaluation (Signed)
Occupational Therapy Evaluation Patient Details Name: [name] MRN: [id number] DOB: Apr 30, 1932 Today's Date: 04/03/2012 Time: 2536-6440 OT Time Calculation (min): 22 min  OT Assessment / Plan / Recommendation Clinical Impression  Pt is a 78 yo female who presents with OPEN REDUCTION INTERNAL FIXATION (ORIF) PERIPROSTHETIC FRACTURE (Left). Skilled OT indicated to maximize independence wit BADLs to max A level in prep for d/c to next venue of care.    OT Assessment  Patient needs continued OT Services    Follow Up Recommendations  Skilled nursing facility    Barriers to Discharge Inaccessible home environment;Decreased caregiver support    Equipment Recommendations  Defer to next venue    Recommendations for Other Services    Frequency  Min 1X/week    Precautions / Restrictions Precautions Precautions: Fall Restrictions Weight Bearing Restrictions: Yes LLE Weight Bearing: Non weight bearing   Pertinent Vitals/Pain Reported 8/10 pain in L knee. Pt repositioned for comfort.    ADL  Grooming: Simulated;Minimal assistance Where Assessed - Grooming: Unsupported sitting Toilet Transfer: Simulated;+2 Total assistance Toilet Transfer: Patient Percentage: 10% Toilet Transfer Method: Stand pivot Toilet Transfer Equipment: Other (comment) Toileting - Clothing Manipulation and Hygiene: +2 Total assistance Toileting - Clothing Manipulation and Hygiene: Patient Percentage: 10% Where Assessed - Toileting Clothing Manipulation and Hygiene: Standing Equipment Used: Gait belt;Rolling walker Transfers/Ambulation Related to ADLs: SPT to chair only. Pt very fearful and anxious with mobility. ADL Comments: Pt fatigues quickly. Max time and effort needed for all functional tasks    OT Diagnosis: Generalized weakness  OT Problem List: Decreased strength;Decreased safety awareness;Decreased activity tolerance;Decreased knowledge of use of DME or AE;Pain;Decreased knowledge of precautions OT  Treatment Interventions: Self-care/ADL training;Therapeutic activities;DME and/or AE instruction;Patient/family education   OT Goals Acute Rehab OT Goals OT Goal Formulation: With patient Time For Goal Achievement: 04/17/12 Potential to Achieve Goals: Good ADL Goals Pt Will Perform Grooming: with set-up;Sitting, edge of bed;Sitting, chair;Unsupported ADL Goal: Grooming - Progress: Goal set today Pt Will Transfer to Toilet: with max assist;Stand pivot transfer;3-in-1;Maintaining weight bearing status ADL Goal: Toilet Transfer - Progress: Goal set today Pt Will Perform Toileting - Clothing Manipulation: with max assist;Standing ADL Goal: Toileting - Clothing Manipulation - Progress: Goal set today Pt Will Perform Toileting - Hygiene: with max assist;Sit to stand from 3-in-1/toilet ADL Goal: Toileting - Hygiene - Progress: Goal set today  Visit Information  Last OT Received On: 04/03/12 Assistance Needed: +2 PT/OT Co-Evaluation/Treatment: Yes    Subjective Data  Subjective: Oh no! Help! Patient Stated Goal: Not asked   Prior Functioning  Vision/Perception  Home Living Lives With: Alone Type of Home: House Additional Comments: pt plan ios to D/C to SNF Prior Function Level of Independence: Independent Communication Communication: No difficulties      Cognition  Overall Cognitive Status: Appears within functional limits for tasks assessed/performed (Simultaneous filing. User may not have seen previous data.) Arousal/Alertness: Awake/alert (Simultaneous filing. User may not have seen previous data.) Orientation Level: Appears intact for tasks assessed (Simultaneous filing. User may not have seen previous data.) Behavior During Session: Anxious (Simultaneous filing. User may not have seen previous data.)    Extremity/Trunk Assessment Right Upper Extremity Assessment RUE ROM/Strength/Tone: Deficits RUE ROM/Strength/Tone Deficits: generalized weakness Left Upper Extremity  Assessment LUE ROM/Strength/Tone: Deficits LUE ROM/Strength/Tone Deficits: generalized weakness Right Lower Extremity Assessment RLE ROM/Strength/Tone: WFL for tasks assessed Left Lower Extremity Assessment LLE ROM/Strength/Tone: Deficits;Unable to fully assess;Due to pain LLE ROM/Strength/Tone Deficits: ankle AROM [facility]   Mobility  Shoulder Instructions  Bed Mobility Bed Mobility: Supine to Sit (Simultaneous filing. User may not have seen previous data.) Supine to Sit: 1: +2 Total assist (Simultaneous filing. User may not have seen previous data.) Supine to Sit: Patient Percentage: 30% (Simultaneous filing. User may not have seen previous data.) Details for Bed Mobility Assistance: multimodal cues for technique; +2 for scooting, Ub and LLE assist (Simultaneous filing. User may not have seen previous data.) Transfers Transfers: Sit to Stand;Stand to Sit Sit to Stand: 1: +2 Total assist;From bed (Simultaneous filing. User may not have seen previous data.) Sit to Stand: Patient Percentage: 10% (Simultaneous filing. User may not have seen previous data.) Stand to Sit: 1: +2 Total assist;To chair/3-in-1 (Simultaneous filing. User may not have seen previous data.) Stand to Sit: Patient Percentage: 10% (Simultaneous filing. User may not have seen previous data.) Details for Transfer Assistance: Assist needed to rise and stabilize, maintain NWB status to LLE. Max VCs for posture and technique (Simultaneous filing. User may not have seen previous data.)          Balance     End of Session OT - End of Session Equipment Utilized During Treatment: Gait belt Activity Tolerance: Patient limited by pain Patient left: in chair;with call bell/phone within reach Nurse Communication: Need for lift equipment  [place] A OTR/L [phone] 04/03/2012, 9:47 AM

## 2012-04-03 NOTE — Progress Notes (Signed)
Patient has a bed @ [facility]. Anticipating discharge early next week.   Clinical Social Work Department CLINICAL SOCIAL WORK PLACEMENT NOTE 04/03/2012  Patient:  PORCHA, DEBLANC  Account Number:  [id number] Admit date:  03/30/2012  Clinical Social Worker:  Renold Genta  Date/time:  04/03/2012 10:42 AM  Clinical Social Work is seeking post-discharge placement for this patient at the following level of care:   SKILLED NURSING   (*CSW will update this form in Epic as items are completed)   04/03/2012  Patient/family provided with [place] Department of Clinical Social Work's list of facilities offering this level of care within the geographic area requested by the patient (or if unable, by the patient's family).  04/03/2012  Patient/family informed of their freedom to choose among providers that offer the needed level of care, that participate in Medicare, Medicaid or managed care program needed by the patient, have an available bed and are willing to accept the patient.  04/03/2012  Patient/family informed of [facility]' ownership interest in [facility], as well as of the fact that they are under no obligation to receive care at this facility.  PASARR submitted to EDS on 04/03/2012 PASARR number received from EDS on 04/03/2012  FL2 transmitted to all facilities in geographic area requested by pt/family on  04/03/2012 FL2 transmitted to all facilities within larger geographic area on   Patient informed that his/her managed care company has contracts with or will negotiate with  certain facilities, including the following:     Patient/family informed of bed offers received:  04/03/2012 Patient chooses bed at [facility] [place] Physician recommends and patient chooses bed at    Patient to be transferred to  on   Patient to be transferred to facility by   The following physician request were entered in Epic:   Additional  Comments:  Winfred Leeds, [place] Worker cell #: [phone]

## 2012-04-03 NOTE — Progress Notes (Signed)
Subjective: 3 Days Post-Op Procedure(s) (LRB): OPEN REDUCTION INTERNAL FIXATION (ORIF) PERIPROSTHETIC FRACTURE (Left)   Patient reports pain as mild, pain well controlled. No events throughout the night.  Objective:   VITALS:   Filed Vitals:   04/03/12 0553  BP: 128/77  Pulse: 109  Temp: 98.3 F (36.8 C)  Resp: 16    Neurovascular intact Dorsiflexion/Plantar flexion intact Incision: dressing C/D/I No cellulitis present Compartment soft  LABS  Basename 04/03/12 0025 04/02/12 1755 04/02/12 0405 04/01/12 0413  HGB 9.6* 10.5* 6.7* --  HCT 28.1* 30.9* 20.0* --  WBC 13.2* -- 11.9* 14.3*  PLT 211 -- 219 280     Basename 04/03/12 0025 04/02/12 0405 04/01/12 0413  NA 133* 133* 134*  K 4.0 3.4* 4.1  BUN _0 CREATININE 0.75 1.06 0.92  GLUCOSE 126* 100* 130*     Assessment/Plan: 3 Days Post-Op Procedure(s) (LRB): OPEN REDUCTION INTERNAL FIXATION (ORIF) PERIPROSTHETIC FRACTURE (Left) Up with therapy, NWB left leg Knee immobilizer only need for comfort, otherwise may be off Daily dressing changes with 4x4 guaze and tape D/C plan per patient is eventually to SNF Orthopaedically stable Will be on Lovenox for 2 weeks, then ASA 381m bid for 4 weeks Oxycodone for pain management Robaxin for possible muscle spasms Bowel management MiraLax and Colace bid Iron 3215mtid for 2-3 weeks [facility]    Follow up with OLIN,Nakia Remmers D in 2 weeks.   Contact information:   [id number]       ABLA  Treated with transfusion of blood, H&H had since elevated.   [facility]   PAC  04/03/2012, 10:08 AM

## 2012-04-03 NOTE — Evaluation (Signed)
Physical Therapy Evaluation Patient Details Name: [name] MRN: [id number] DOB: 21-Sep-1931 Today's Date: 04/03/2012 Time: 4098-1191 PT Time Calculation (min): 22 min  PT Assessment / Plan / Recommendation Clinical Impression  pt is s/p left femur periprosthetic fx and will benefit from Pt to maximize independence for next venue of care and eventual home; some nausea and dizziness with sitting, resolved once pt in chair;     PT Assessment  Patient needs continued PT services    Follow Up Recommendations  Skilled nursing facility    Barriers to Discharge        Equipment Recommendations  Defer to next venue    Recommendations for Other Services     Frequency Min 3X/week    Precautions / Restrictions Precautions Precautions: Fall Restrictions Weight Bearing Restrictions: Yes LLE Weight Bearing: Non weight bearing   Pertinent Vitals/Pain       Mobility  Bed Mobility Bed Mobility: Supine to Sit (Simultaneous filing. User may not have seen previous data.) Supine to Sit: 1: +2 Total assist (Simultaneous filing. User may not have seen previous data.) Supine to Sit: Patient Percentage: 30% (Simultaneous filing. User may not have seen previous data.) Details for Bed Mobility Assistance: multimodal cues for technique; +2 for scooting, Ub and LLE assist (Simultaneous filing. User may not have seen previous data.) Transfers Transfers: Sit to Stand;Stand to Sit;Stand Pivot Transfers Sit to Stand: 1: +2 Total assist;From bed (Simultaneous filing. User may not have seen previous data.) Sit to Stand: Patient Percentage: 10% (Simultaneous filing. User may not have seen previous data.) Stand to Sit: 1: +2 Total assist;To chair/3-in-1 (Simultaneous filing. User may not have seen previous data.) Stand to Sit: Patient Percentage: 10% (Simultaneous filing. User may not have seen previous data.) Stand Pivot Transfers: 1: +2 Total assist Stand Pivot Transfers: Patient Percentage:  10% Details for Transfer Assistance: Assist needed to rise and stabilize, maintain NWB status to LLE. Max VCs for posture and technique (Simultaneous filing. User may not have seen previous data.) Ambulation/Gait Ambulation/Gait Assistance: Not tested (comment)    Exercises General Exercises - Lower Extremity Ankle Circles/Pumps: AROM;Both;10 reps   PT Diagnosis: Difficulty walking  PT Problem List: Decreased strength;Decreased range of motion;Decreased activity tolerance;Decreased balance;Decreased mobility;Decreased knowledge of precautions;Decreased knowledge of use of DME;Pain PT Treatment Interventions: DME instruction;Gait training;Functional mobility training;Therapeutic activities;Therapeutic exercise;Patient/family education   PT Goals Acute Rehab PT Goals PT Goal Formulation: With patient Time For Goal Achievement: 04/17/12 Potential to Achieve Goals: Good Pt will go Supine/Side to Sit: with min assist PT Goal: Supine/Side to Sit - Progress: Goal set today Pt will go Sit to Stand: with min assist PT Goal: Sit to Stand - Progress: Goal set today Pt will go Stand to Sit: with min assist PT Goal: Stand to Sit - Progress: Goal set today Pt will Ambulate: 16 - 50 feet;with min assist;with rolling walker PT Goal: Ambulate - Progress: Goal set today  Visit Information  Last PT Received On: 04/03/12 Assistance Needed: +2 PT/OT Co-Evaluation/Treatment: Yes    Subjective Data  Subjective: help Patient Stated Goal: none   Prior Functioning  Home Living Lives With: Alone Type of Home: House Additional Comments: pt plan ios to D/C to SNF Prior Function Level of Independence: Independent Communication Communication: No difficulties    Cognition  Overall Cognitive Status: Appears within functional limits for tasks assessed/performed (Simultaneous filing. User may not have seen previous data.) Arousal/Alertness: Awake/alert (Simultaneous filing. User may not have seen  previous data.) Orientation Level: Appears  intact for tasks assessed (Simultaneous filing. User may not have seen previous data.) Behavior During Session: Anxious (Simultaneous filing. User may not have seen previous data.)    Extremity/Trunk Assessment Right Upper Extremity Assessment RUE ROM/Strength/Tone: Deficits RUE ROM/Strength/Tone Deficits: generalized weakness Left Upper Extremity Assessment LUE ROM/Strength/Tone: Deficits LUE ROM/Strength/Tone Deficits: generalized weakness Right Lower Extremity Assessment RLE ROM/Strength/Tone: WFL for tasks assessed Left Lower Extremity Assessment LLE ROM/Strength/Tone: Deficits;Unable to fully assess;Due to pain LLE ROM/Strength/Tone Deficits: ankle AROM WFL   Balance    End of Session PT - End of Session Equipment Utilized During Treatment: Gait belt Activity Tolerance: Patient limited by fatigue;Patient limited by pain Patient left: in chair;with call bell/phone within reach Nurse Communication: Mobility status;Need for lift equipment  GP     [facility] 04/03/2012, 9:48 AM

## 2012-04-03 NOTE — Progress Notes (Signed)
Clinical Social Work Department BRIEF PSYCHOSOCIAL ASSESSMENT 04/03/2012  Patient:  [name], [name]     Account Number:  [id number]     Admit date:  03/30/2012  Clinical Social Worker:  Renold Genta  Date/Time:  04/02/2012 10:31 AM  Referred by:  Physician  Date Referred:  04/02/2012 Referred for  SNF Placement   Other Referral:   Interview type:  Patient Other interview type:   and daughter, Joy    PSYCHOSOCIAL DATA Living Status:  ALONE Admitted from facility:   Level of care:   Primary support name:  Abran Cantor (daughter) c#: [phone] h#: [phone] Georgina Quint (daughter) h#: [phone] c#: [phone] Sabrinna Yearwood (son) h#: [phone] c#: [phone]  Primary support relationship to patient:  CHILD, ADULT Degree of support available:   good    CURRENT CONCERNS Current Concerns  Post-Acute Placement   Other Concerns:    SOCIAL WORK ASSESSMENT / PLAN CSW spoke with patient & daughter, Caryl Asp at bedside re: discharge planning. Daughter & patient expressed interest in [facility] or [facility].   Assessment/plan status:  Information/Referral to [facility] Other assessment/ plan:   Information/referral to community resources:   CSW completed FL2 and faxed information to [facility]. CSW spoke with Beth @ [facility] who states they have no female beds available. Bryson Ha @ [facility] states they will have bed available.    PATIENT'S/FAMILY'S RESPONSE TO PLAN OF CARE: Patient/family pleased to hear that [facility] will have bed available at discharge. Anticipating discharge to SNF early next week.    Winfred Leeds, [facility] Clinical Social Worker cell #: [phone]

## 2012-04-03 NOTE — Progress Notes (Signed)
Patient ran fever of 102.3. Got order for Tylenol. Rechecked 1hr post admin; 100.1.  Will monitor patient for fever and pain control during shift.

## 2012-04-04 ENCOUNTER — Inpatient Hospital Stay (HOSPITAL_COMMUNITY): Payer: Medicare Other

## 2012-04-04 DIAGNOSIS — J189 Pneumonia, unspecified organism: Secondary | ICD-10-CM | POA: Diagnosis not present

## 2012-04-04 LAB — URINE MICROSCOPIC-ADD ON

## 2012-04-04 LAB — URINALYSIS, ROUTINE W REFLEX MICROSCOPIC
Bilirubin Urine: NEGATIVE
Nitrite: NEGATIVE
Specific Gravity, Urine: 1.019 (ref 1.005–1.030)
pH: 7.5 (ref 5.0–8.0)

## 2012-04-04 LAB — TYPE AND SCREEN
ABO/RH(D): O POS
Antibody Screen: NEGATIVE
Unit division: 0

## 2012-04-04 LAB — CBC
HCT: 26 % — ABNORMAL LOW (ref 36.0–46.0)
Hemoglobin: 8.8 g/dL — ABNORMAL LOW (ref 12.0–15.0)
MCHC: 33.8 g/dL (ref 30.0–36.0)

## 2012-04-04 MED ORDER — LEVOFLOXACIN IN D5W 750 MG/150ML IV SOLN
750.0000 mg | INTRAVENOUS | Status: DC
Start: 2012-04-04 — End: 2012-04-08
  Administered 2012-04-04 – 2012-04-07 (×4): 750 mg via INTRAVENOUS
  Filled 2012-04-04 (×6): qty 150

## 2012-04-04 MED ORDER — MORPHINE SULFATE 2 MG/ML IJ SOLN
1.0000 mg | Freq: Once | INTRAMUSCULAR | Status: AC
  Administered 2012-04-04: 1 mg via INTRAVENOUS
  Filled 2012-04-04: qty 1

## 2012-04-04 MED ORDER — POLYETHYLENE GLYCOL 3350 17 G PO PACK: 17.0000 g | PACK | Freq: Two times a day (BID) | ORAL | Status: AC

## 2012-04-04 MED ORDER — BISACODYL 10 MG RE SUPP
10.0000 mg | Freq: Once | RECTAL | Status: AC
  Administered 2012-04-04: 10 mg via RECTAL
  Filled 2012-04-04: qty 1

## 2012-04-04 MED ORDER — ENOXAPARIN SODIUM 40 MG/0.4ML ~~LOC~~ SOLN: 40.0000 mg | SUBCUTANEOUS | Status: DC

## 2012-04-04 MED ORDER — SENNOSIDES-DOCUSATE SODIUM 8.6-50 MG PO TABS
1.0000 | ORAL_TABLET | Freq: Two times a day (BID) | ORAL | Status: DC
  Administered 2012-04-04 – 2012-04-07 (×4): 1 via ORAL
  Filled 2012-04-04 (×10): qty 1

## 2012-04-04 MED ORDER — ASPIRIN EC 325 MG PO TBEC: 325.0000 mg | DELAYED_RELEASE_TABLET | Freq: Two times a day (BID) | ORAL | Status: AC

## 2012-04-04 MED ORDER — METHOCARBAMOL 500 MG PO TABS: 500.0000 mg | ORAL_TABLET | Freq: Four times a day (QID) | ORAL | Status: AC | PRN

## 2012-04-04 MED ORDER — OXYCODONE HCL 5 MG PO TABS: 5.0000 mg | ORAL_TABLET | ORAL | Status: AC | PRN

## 2012-04-04 MED ORDER — FERROUS SULFATE 325 (65 FE) MG PO TABS: 325.0000 mg | ORAL_TABLET | Freq: Three times a day (TID) | ORAL | Status: DC

## 2012-04-04 NOTE — Progress Notes (Signed)
Patient still unable to void, bladder scan done highest was 366. Last I/O cath at [facility]. Dr. Clementeen Graham notified, order given to insert foley it in for now. Will continue to assess patient.

## 2012-04-04 NOTE — Progress Notes (Addendum)
   Subjective: 4 Days Post-Op Procedure(s) (LRB): OPEN REDUCTION INTERNAL FIXATION (ORIF) PERIPROSTHETIC FRACTURE (Left)   Patient reports pain as mild, pain well controlled. Stated that after sitting up all day yesterday that she had some pain issues later in the day. She is feeling much better this morning. Otherwise no events.  Objective:   VITALS:   Filed Vitals:   04/04/12 0525  BP: 154/80  Pulse: 80  Temp: 97.3 F (36.3 C)  Resp: 16    Neurovascular intact Dorsiflexion/Plantar flexion intact Incision: dressing C/D/I No cellulitis present Compartment soft  LABS  Basename 04/04/12 0928 04/03/12 0025 04/02/12 1755 04/02/12 0405  HGB 8.8* 9.6* 10.5* --  HCT 26.0* 28.1* 30.9* --  WBC 13.4* 13.2* -- 11.9*  PLT 258 211 -- 219     Basename 04/03/12 0025 04/02/12 0405  NA 133* 133*  K 4.0 3.4*  BUN 12 14  CREATININE 0.75 1.06  GLUCOSE 126* 100*     Assessment/Plan: 4 Days Post-Op Procedure(s) (LRB): OPEN REDUCTION INTERNAL FIXATION (ORIF) PERIPROSTHETIC FRACTURE (Left) Up with therapy, NWB left leg  Knee immobilizer only need for comfort, otherwise may be off  Daily dressing changes with 4x4 guaze and tape  D/C plan per patient is eventually to SNF  Orthopaedically stable  Will be on Lovenox for 2 weeks, then ASA 342m bid for 4 weeks  Oxycodone for pain management (Rx placed on chart) Robaxin for possible muscle spasms (Rx placed on chart) Bowel management MiraLax and Colace bid  Iron 3281mtid for 2-3 weeks  [facility]    Follow up with OLIN,Dorrien Grunder D in 2 weeks.    Contact information:    [phone]      ABLA  Treated with transfusion of blood, H&H had since elevated.    [facility]   PAC  04/04/2012, 10:33 AM

## 2012-04-04 NOTE — Progress Notes (Signed)
[facility] PROGRESS NOTE  [name] UDJ:[id number] DOB: 1932-05-30 DOA: 03/30/2012 PCP: [name], PA  Assessment/Plan:   Principal Problem:  *Femur fracture, left  S/p ORIF on 8/27. Ortho following. As per recommendations, NWB left leg. Knee immobilizer only need for comfort, otherwise can be dced.  Daily dressing changes with 4x4 guaze and tape  Recommend on on Lovenox for 2 weeks, then ASA 358m bid for 4 weeks  -Oxycodone prn and Robaxin for muscle spasms  - MiraLax and Colace bid  -Iron 3277mtid  [facility] with [facility]   Active Problems:   Hypertension  Resume home meds   fever on 8/29 and 8/30 unexplained. Mild leucocytosis as well/  pancx sent. CXR negative. Will follow follow UA/ urine cx CXR repeated today showing a developing left sided pneumonia. Will start her on levaquin   Acute blood loss anemia  H&H stable after PRBC. Cont iron supplements   History of shingles/postherpetic neuralgia:  Healed rash of shingles across left chest in a bandlike pattern. Completed course of acyclovir. Pain management.   Urinary retention Bladder scan with over 350 cc noted repeatedly  will place her back on foley today and monitor  Code Status:partial  Family Communication: grandson at bedside  Disposition Plan: to SNF likely on 9/3   Brief narrative:  [age]ear old female with history of bilateral TKR, recent episode of shingles approximately 3 weeks ago, hypertension, sustained a mechanical fall at home without any associated symptoms such as dizziness, lightheadedness, chest pain, palpitations or dyspnea. Evaluation in the emergency department revealed left periprosthetic distal femoral comminuted fracture. Orthopedics was consulted and patient was admitted for further management.    Consultants:  Dr OlAlvan Dame Procedures:  ORIF on 8/27 Antibiotics:  None  HPI/Subjective:  Feels tired and c/o left leg pain. Had temp spike of  102.3 overnight. Denies chest pain , SOB or abdominal pain. Denies bowel or urinary symptoms.    HPI/Subjective: Feels ok except of left leg pain. Still had temp spike of 102.7 overnight. Has been having urinary retention.  Objective: Filed Vitals:   04/03/12 2049 04/04/12 0237 04/04/12 0525 04/04/12 1123  BP: 149/75  154/80 170/84  Pulse: 83  80   Temp: 102.7 F (39.3 C) 99.2 F (37.3 C) 97.3 F (36.3 C)   TempSrc: Oral Oral Oral   Resp: 18  16   Height:      Weight:      SpO2: 96%  95%     Intake/Output Summary (Last 24 hours) at 04/04/12 1408 Last data filed at 04/04/12 0600  Gross per 24 hour  Intake    480 ml  Output   1250 ml  Net   -770 ml   Filed Weights   03/30/12 1741 03/30/12 2357  Weight: 68.04 kg (150 lb) 69.4 kg (153 lb)    Exam:  General: Elderly female in NAD  HEENT: no pallor moist oral mucosa  Cardiovascular: NS1&S2, no murmurs  Respiratory: clear b/l, no added sounds  Abdomen: soft, NT, ND, BS+  Ext: Warm, left knee immobilizer, dressing over surgical site intact  CNS: AAOX 3    Data Reviewed: Basic Metabolic Panel:  Lab 0858/85/[id number] 04/02/12 0405 04/01/12 0413 03/31/12 0430 03/30/12 1850  NA 133* 133* 134* 136 137  K 4.0 3.4* 4.1 4.0 4.4  CL 102 100 100 104 101  CO2 _0 GLUCOSE 126* 100* 130* 119* 161*  BUN _1 26*  28*  CREATININE 0.75 1.06 0.92 1.06 0.88  CALCIUM 8.3* 8.1* 8.6 8.9 10.0  MG -- -- -- -- --  PHOS -- -- -- -- --   Liver Function Tests: No results found for this basename: AST:5,ALT:5,ALKPHOS:5,BILITOT:5,PROT:5,ALBUMIN:5 in the last 168 hours No results found for this basename: LIPASE:5,AMYLASE:5 in the last 168 hours No results found for this basename: AMMONIA:5 in the last 168 hours CBC:  Lab 04/04/12 0928 04/03/12 0025 04/02/12 1755 04/02/12 0405 04/01/12 0413 03/31/12 0430 03/30/12 1850  WBC 13.4* 13.2* -- 11.9* 14.3* 12.7* --  NEUTROABS -- 10.1* -- -- -- -- 16.3*  HGB 8.8* 9.6* 10.5* 6.7* 8.3*  -- --  HCT 26.0* 28.1* 30.9* 20.0* 25.3* -- --  MCV 90.3 89.2 -- 91.3 91.0 89.6 --  PLT 258 211 -- 219 280 321 --   Cardiac Enzymes:  Lab 03/30/12 1850  CKTOTAL 94  CKMB --  CKMBINDEX --  TROPONINI --   BNP (last 3 results) No results found for this basename: PROBNP:3 in the last 8760 hours CBG:  Lab 03/31/12 1643 03/31/12 0822  GLUCAP 129* 123*    Recent Results (from the past 240 hour(s))  SURGICAL PCR SCREEN     Status: Abnormal   Collection Time   03/31/12  1:10 AM      Component Value Range Status Comment   MRSA, PCR POSITIVE (*) NEGATIVE Final    Staphylococcus aureus POSITIVE (*) NEGATIVE Final   CULTURE, BLOOD (ROUTINE X 2)     Status: Normal (Preliminary result)   Collection Time   04/03/12 12:25 AM      Component Value Range Status Comment   Specimen Description BLOOD RIGHT ANTECUBITAL   Final    Special Requests BOTTLES DRAWN AEROBIC AND ANAEROBIC 4 CC EA   Final    Culture  Setup Time 04/03/2012 04:40   Final    Culture     Final    Value:        BLOOD CULTURE RECEIVED NO GROWTH TO DATE CULTURE WILL BE HELD FOR 5 DAYS BEFORE ISSUING A FINAL NEGATIVE REPORT   Report Status PENDING   Incomplete   CULTURE, BLOOD (ROUTINE X 2)     Status: Normal (Preliminary result)   Collection Time   04/03/12 12:35 AM      Component Value Range Status Comment   Specimen Description BLOOD LEFT HAND   Final    Special Requests BOTTLES DRAWN AEROBIC ONLY 1 CC   Final    Culture  Setup Time 04/03/2012 04:40   Final    Culture     Final    Value:        BLOOD CULTURE RECEIVED NO GROWTH TO DATE CULTURE WILL BE HELD FOR 5 DAYS BEFORE ISSUING A FINAL NEGATIVE REPORT   Report Status PENDING   Incomplete      Studies: Dg Ribs Unilateral W/chest Left  03/30/2012  *RADIOLOGY REPORT*  Clinical Data: [place] today.  The patient reports no chest symptoms at this time.  LEFT RIBS AND CHEST - 3+ VIEW  Comparison: Chest dated 08/22/2009.  Findings: Normal sized heart.  Tortuous aorta.  Small  hiatal hernia.  Clear lungs.  Mild diffuse peribronchial thickening and prominence of the interstitial markings.  Old, healed right rib fractures.  No acute rib fracture or pneumothorax seen.  Diffuse osteopenia.  IMPRESSION:  1.  No acute fracture. 2.  Small hiatal hernia. 3.  Mild chronic bronchitic changes.   Original Report Authenticated By: Remo Lipps  Cory Roughen, M.D.    Dg Hip Complete Left  03/30/2012  *RADIOLOGY REPORT*  Clinical Data: Left femur pain following a fall today.  LEFT HIP - COMPLETE 2+ VIEW  Comparison: None.  Findings: Diffuse osteopenia.  No fracture or dislocation seen.  IMPRESSION: No fracture or dislocation.   Original Report Authenticated By: Gerald Stabs, M.D.    Dg Femur Left  03/31/2012  *RADIOLOGY REPORT*  Clinical Data: Fracture fixation  DG C-ARM 61-120 MIN - [facility]PT [facility], LEFT FEMUR - 2 VIEW  Comparison: Plain films 03/30/2012.  Findings: We are provided with six fluoroscopic spot views of the left femur.  Images demonstrate placement of a plate and screws and a cerclage wire for fixation of a distal femur fracture.  Position and alignment appear near anatomic.  Knee replacement noted.  IMPRESSION: ORIF distal left femur fracture.   Original Report Authenticated By: Arvid Right. D'ALESSIO, M.D.    Dg Knee 1-2 Views Left  03/30/2012  *RADIOLOGY REPORT*  Clinical Data: Left femur and knee pain following a fall today.  LEFT KNEE - 1-2 VIEW  Comparison: None.  Findings: Comminuted fracture of the distal shaft and metadiaphysis of the femur with medial and posterior angulation of the distal fragment as well as approximately one half shaft width of medial posterior displacement of the distal fragment.  There is also a medially displaced middle fragment.  A total knee prosthesis is also noted.  Diffuse osteopenia.  IMPRESSION: Comminuted distal femur fracture, as described above.   Original Report Authenticated By: Gerald Stabs, M.D.    Dg Chest Port 1 View  04/04/2012  *RADIOLOGY  REPORT*  Clinical Data: Postop ORIF left femur fracture.  Follow up basilar atelectasis.  PORTABLE CHEST - 1 VIEW 04/04/2012 1111 hours:  Comparison: Portable chest x-ray yesterday and one-view chest x-ray 03/30/2012.  Findings: Cardiac silhouette mildly enlarged but stable.  Pulmonary vascularity normal without evidence of pulmonary edema.  Stable linear atelectasis in the lung bases.  Focal airspace opacity with air bronchograms in the medial left lung base.  Probable small effusions, unchanged.  IMPRESSION: Stable mild bibasilar atelectasis.  Developing pneumonia suspected in the medial left lung base.  Stable mild cardiomegaly without pulmonary edema.   Original Report Authenticated By: Deniece Portela, M.D.    Dg Chest Port 1 View  04/03/2012  *RADIOLOGY REPORT*  Clinical Data: Postoperative fever.  PORTABLE CHEST - 1 VIEW  Comparison: Plain film chest 03/30/2012.  Findings: Lung volumes are low with basilar atelectasis.  There appear to be very small bilateral pleural effusions.  Heart size is normal.  No pneumothorax.  IMPRESSION: Mild bibasilar atelectasis in a low-volume chest.  Very small bilateral pleural effusions are identified.   Original Report Authenticated By: Arvid Right. D'ALESSIO, M.D.    Dg C-arm 61-120 Min-no Report  03/31/2012  *RADIOLOGY REPORT*  Clinical Data: Fracture fixation  DG C-ARM 61-120 MIN - [facility], LEFT FEMUR - 2 VIEW  Comparison: Plain films 03/30/2012.  Findings: We are provided with six fluoroscopic spot views of the left femur.  Images demonstrate placement of a plate and screws and a cerclage wire for fixation of a distal femur fracture.  Position and alignment appear near anatomic.  Knee replacement noted.  IMPRESSION: ORIF distal left femur fracture.   Original Report Authenticated By: Arvid Right. D'ALESSIO, M.D.     Scheduled Meds:   . bisacodyl  10 mg Rectal Once  . Chlorhexidine Gluconate Cloth  6 each Topical Q0600  .  doxazosin  8 mg Oral QHS  . enoxaparin  (LOVENOX) injection  40 mg Subcutaneous Q24H  . ferrous sulfate  325 mg Oral TID PC  . losartan  100 mg Oral Daily   And  . hydrochlorothiazide  25 mg Oral Daily  . mupirocin ointment  1 application Nasal BID  . Nebivolol HCl  20 mg Oral q morning - 10a  . pantoprazole  40 mg Oral Q1200  . polyethylene glycol  17 g Oral BID  . senna-docusate  1 tablet Oral BID  . DISCONTD: docusate sodium  100 mg Oral BID  . DISCONTD: losartan-hydrochlorothiazide  1 tablet Oral Daily   Continuous Infusions:   . sodium chloride 75 mL/hr at 04/03/12 1819      Time spent: 30 minutes    Socorro Ebron, [place]  [facility] Pager [phone]. If 8PM-8AM, please contact night-coverage at [email or web address], password [facility] 04/04/2012, 2:08 PM  LOS: 5 days

## 2012-04-05 LAB — CBC
MCH: 30.5 pg (ref 26.0–34.0)
MCHC: 33.7 g/dL (ref 30.0–36.0)
RDW: 14.6 % (ref 11.5–15.5)

## 2012-04-05 NOTE — Progress Notes (Signed)
Pt with poor effort with mobilty this shift. Reports pain relief w/meds as long as she lays still. Refuses q 2 hour turning. RN explained risks of deconditoning/skin breakdown if she is not willing to mobilize.

## 2012-04-05 NOTE — Progress Notes (Signed)
Rogue Bussing, NP is aware of the pt's fever. Blood cultures were drawn on 04/03/12. Tylenol, 650 mg PO, was given at 2231. Nurse is monitoring temperature closely. Pt recently drank a cup of apple juice and is resting comfortably.

## 2012-04-05 NOTE — Progress Notes (Signed)
PT Cancel note:  Pt declined to get OOB today, however agreed that she would try tomorrow.   Thanks,  Terisa Starr, PT

## 2012-04-05 NOTE — Progress Notes (Signed)
[facility] PROGRESS NOTE  [name] ASN:[id number] DOB: 05/14/1932 DOA: 03/30/2012 PCP: [name], PA    Assessment/Plan:  Principal Problem:  *Femur fracture, left  S/p ORIF on 8/27. Ortho following. As per recommendations, NWB left leg. Knee immobilizer only need for comfort, otherwise can be dced.  Daily dressing changes with 4x4 guaze and tape  Recommend on on Lovenox for 2 weeks, then ASA 356m bid for 4 weeks  -Oxycodone prn and Robaxin for muscle spasms  - MiraLax and Colace bid  -Iron 3274mtid  [place] [place]   Active Problems:  Hypertension  Resume home meds   Fever with  leucocytosis   pancx sent.  CXR repeated  showing a developing left sided pneumonia. UA also suggests UTI started on levaquin (8/31--) folow cx  Acute blood loss anemia  H&H stable after PRBC. Cont iron supplements   History of shingles/postherpetic neuralgia:  Healed rash of shingles across left chest in a bandlike pattern. Completed course of acyclovir. Pain management.   Urinary retention  Bladder scan with over 350 cc noted repeatedly  Foley resumed . Will follow   Code Status:partial   Disposition Plan: to SNF likely on 9/3   Brief narrative:  [age]ear old female with history of bilateral TKR, recent episode of shingles approximately 3 weeks ago, hypertension, sustained a mechanical fall at home without any associated symptoms such as dizziness, lightheadedness, chest pain, palpitations or dyspnea. Evaluation in the emergency department revealed left periprosthetic distal femoral comminuted fracture. Orthopedics was consulted and patient was admitted for further management.    Consultants:  Dr OlAlvan Damerocedures:  ORIF on 8/27 Antibiotics:  None  Antibiotics:  IV levaquin ( 8/31--)  HPI/Subjective: Feels better today. Had tmax of 100.7 overnight  Objective: Filed Vitals:   04/05/12 0800 04/05/12 1200 04/05/12 1345 04/05/12 1600    BP:   134/68   Pulse:   77   Temp:   99.5 F (37.5 C)   TempSrc:   Oral   Resp: 16 94 18 18  Height:      Weight:      SpO2: 93% 18% 95% 98%    Intake/Output Summary (Last 24 hours) at 04/05/12 1708 Last data filed at 04/05/12 1300  Gross per 24 hour  Intake   2220 ml  Output   1051 ml  Net   1169 ml   Filed Weights   03/30/12 1741 03/30/12 2357  Weight: 68.04 kg (150 lb) 69.4 kg (153 lb)    Exam:  General: Elderly female in NAD  HEENT: no pallor moist oral mucosa  Cardiovascular: NS1&S2, no murmurs  Respiratory: clear b/l, no added sounds  Abdomen: soft, NT, ND, BS+  Ext: Warm, left knee immobilizer, dressing over surgical site intact  CNS: AAOX 3    Data Reviewed: Basic Metabolic Panel:  Lab [id number] 04/02/12 0405 04/01/12 0413 03/31/12 0430 03/30/12 1850  NA 133* 133* 134* 136 137  K 4.0 3.4* 4.1 4.0 4.4  CL 102 100 100 104 101  CO2 _0 GLUCOSE 126* 100* 130* 119* 161*  BUN _1 26* 28*  CREATININE 0.75 1.06 0.92 1.06 0.88  CALCIUM 8.3* 8.1* 8.6 8.9 10.0  MG -- -- -- -- --  PHOS -- -- -- -- --   Liver Function Tests: No results found for this basename: AST:5,ALT:5,ALKPHOS:5,BILITOT:5,PROT:5,ALBUMIN:5 in the last 168 hours No results found for this basename: LIPASE:5,AMYLASE:5 in the last 168 hours  No results found for this basename: AMMONIA:5 in the last 168 hours CBC:  Lab 04/05/12 0613 04/04/12 0928 04/03/12 0025 04/02/12 1755 04/02/12 0405 04/01/12 0413 03/30/12 1850  WBC 13.5* 13.4* 13.2* -- 11.9* 14.3* --  NEUTROABS -- -- 10.1* -- -- -- 16.3*  HGB 8.7* 8.8* 9.6* 10.5* 6.7* -- --  HCT 25.8* 26.0* 28.1* 30.9* 20.0* -- --  MCV 90.5 90.3 89.2 -- 91.3 91.0 --  PLT 267 258 211 -- 219 280 --   Cardiac Enzymes:  Lab 03/30/12 1850  CKTOTAL 94  CKMB --  CKMBINDEX --  TROPONINI --   BNP (last 3 results) No results found for this basename: PROBNP:3 in the last 8760 hours CBG:  Lab 03/31/12 1643 03/31/12 0822  GLUCAP 129*  123*    Recent Results (from the past 240 hour(s))  SURGICAL PCR SCREEN     Status: Abnormal   Collection Time   03/31/12  1:10 AM      Component Value Range Status Comment   MRSA, PCR POSITIVE (*) NEGATIVE Final    Staphylococcus aureus POSITIVE (*) NEGATIVE Final   CULTURE, BLOOD (ROUTINE X 2)     Status: Normal (Preliminary result)   Collection Time   04/03/12 12:25 AM      Component Value Range Status Comment   Specimen Description BLOOD RIGHT ANTECUBITAL   Final    Special Requests BOTTLES DRAWN AEROBIC AND ANAEROBIC 4 CC EA   Final    Culture  Setup Time 04/03/2012 04:40   Final    Culture     Final    Value:        BLOOD CULTURE RECEIVED NO GROWTH TO DATE CULTURE WILL BE HELD FOR 5 DAYS BEFORE ISSUING A FINAL NEGATIVE REPORT   Report Status PENDING   Incomplete   CULTURE, BLOOD (ROUTINE X 2)     Status: Normal (Preliminary result)   Collection Time   04/03/12 12:35 AM      Component Value Range Status Comment   Specimen Description BLOOD LEFT HAND   Final    Special Requests BOTTLES DRAWN AEROBIC ONLY 1 CC   Final    Culture  Setup Time 04/03/2012 04:40   Final    Culture     Final    Value:        BLOOD CULTURE RECEIVED NO GROWTH TO DATE CULTURE WILL BE HELD FOR 5 DAYS BEFORE ISSUING A FINAL NEGATIVE REPORT   Report Status PENDING   Incomplete      Studies: Dg Ribs Unilateral W/chest Left  03/30/2012  *RADIOLOGY REPORT*  Clinical Data: [place] today.  The patient reports no chest symptoms at this time.  LEFT RIBS AND CHEST - 3+ VIEW  Comparison: Chest dated 08/22/2009.  Findings: Normal sized heart.  Tortuous aorta.  Small hiatal hernia.  Clear lungs.  Mild diffuse peribronchial thickening and prominence of the interstitial markings.  Old, healed right rib fractures.  No acute rib fracture or pneumothorax seen.  Diffuse osteopenia.  IMPRESSION:  1.  No acute fracture. 2.  Small hiatal hernia. 3.  Mild chronic bronchitic changes.   Original Report Authenticated By: Gerald Stabs,  M.D.    Dg Hip Complete Left  03/30/2012  *RADIOLOGY REPORT*  Clinical Data: Left femur pain following a fall today.  LEFT HIP - COMPLETE 2+ VIEW  Comparison: None.  Findings: Diffuse osteopenia.  No fracture or dislocation seen.  IMPRESSION: No fracture or dislocation.   Original Report Authenticated By: Remo Lipps  Cory Roughen, M.D.    Dg Femur Left  03/31/2012  *RADIOLOGY REPORT*  Clinical Data: Fracture fixation  DG C-ARM 61-120 MIN - NRPT [facility], LEFT FEMUR - 2 VIEW  Comparison: Plain films 03/30/2012.  Findings: We are provided with six fluoroscopic spot views of the left femur.  Images demonstrate placement of a plate and screws and a cerclage wire for fixation of a distal femur fracture.  Position and alignment appear near anatomic.  Knee replacement noted.  IMPRESSION: ORIF distal left femur fracture.   Original Report Authenticated By: Arvid Right. D'ALESSIO, M.D.    Dg Knee 1-2 Views Left  03/30/2012  *RADIOLOGY REPORT*  Clinical Data: Left femur and knee pain following a fall today.  LEFT KNEE - 1-2 VIEW  Comparison: None.  Findings: Comminuted fracture of the distal shaft and metadiaphysis of the femur with medial and posterior angulation of the distal fragment as well as approximately one half shaft width of medial posterior displacement of the distal fragment.  There is also a medially displaced middle fragment.  A total knee prosthesis is also noted.  Diffuse osteopenia.  IMPRESSION: Comminuted distal femur fracture, as described above.   Original Report Authenticated By: Gerald Stabs, M.D.    Dg Chest Port 1 View  04/04/2012  *RADIOLOGY REPORT*  Clinical Data: Postop ORIF left femur fracture.  Follow up basilar atelectasis.  PORTABLE CHEST - 1 VIEW 04/04/2012 1111 hours:  Comparison: Portable chest x-ray yesterday and one-view chest x-ray 03/30/2012.  Findings: Cardiac silhouette mildly enlarged but stable.  Pulmonary vascularity normal without evidence of pulmonary edema.  Stable linear atelectasis  in the lung bases.  Focal airspace opacity with air bronchograms in the medial left lung base.  Probable small effusions, unchanged.  IMPRESSION: Stable mild bibasilar atelectasis.  Developing pneumonia suspected in the medial left lung base.  Stable mild cardiomegaly without pulmonary edema.   Original Report Authenticated By: Deniece Portela, M.D.    Dg Chest Port 1 View  04/03/2012  *RADIOLOGY REPORT*  Clinical Data: Postoperative fever.  PORTABLE CHEST - 1 VIEW  Comparison: Plain film chest 03/30/2012.  Findings: Lung volumes are low with basilar atelectasis.  There appear to be very small bilateral pleural effusions.  Heart size is normal.  No pneumothorax.  IMPRESSION: Mild bibasilar atelectasis in a low-volume chest.  Very small bilateral pleural effusions are identified.   Original Report Authenticated By: Arvid Right. D'ALESSIO, M.D.    Dg C-arm 61-120 Min-no Report  03/31/2012  *RADIOLOGY REPORT*  Clinical Data: Fracture fixation  DG C-ARM 61-120 MIN - NRPT [facility], LEFT FEMUR - 2 VIEW  Comparison: Plain films 03/30/2012.  Findings: We are provided with six fluoroscopic spot views of the left femur.  Images demonstrate placement of a plate and screws and a cerclage wire for fixation of a distal femur fracture.  Position and alignment appear near anatomic.  Knee replacement noted.  IMPRESSION: ORIF distal left femur fracture.   Original Report Authenticated By: Arvid Right. D'ALESSIO, M.D.     Scheduled Meds:   . Chlorhexidine Gluconate Cloth  6 each Topical Q0600  . doxazosin  8 mg Oral QHS  . enoxaparin (LOVENOX) injection  40 mg Subcutaneous Q24H  . ferrous sulfate  325 mg Oral TID PC  . losartan  100 mg Oral Daily   And  . hydrochlorothiazide  25 mg Oral Daily  . levofloxacin (LEVAQUIN) IV  750 mg Intravenous Q24H  .  morphine injection  1 mg Intravenous Once  .  mupirocin ointment  1 application Nasal BID  . Nebivolol HCl  20 mg Oral q morning - 10a  . pantoprazole  40 mg Oral Q1200  .  polyethylene glycol  17 g Oral BID  . senna-docusate  1 tablet Oral BID   Continuous Infusions:   . sodium chloride 75 mL/hr at 04/05/12 1053      Time spent: 30 minutes    Martiza Speth, [place] [facility] Pager [phone] If 8PM-8AM, please contact night-coverage at [email or web address], password [facility] 04/05/2012, 5:08 PM  LOS: 6 days

## 2012-04-06 DIAGNOSIS — M79609 Pain in unspecified limb: Secondary | ICD-10-CM

## 2012-04-06 MED ORDER — VANCOMYCIN HCL 1000 MG IV SOLR
750.0000 mg | Freq: Two times a day (BID) | INTRAVENOUS | Status: DC
  Administered 2012-04-06 – 2012-04-08 (×5): 750 mg via INTRAVENOUS
  Filled 2012-04-06 (×5): qty 750

## 2012-04-06 MED ORDER — PIPERACILLIN-TAZOBACTAM 3.375 G IVPB
3.3750 g | Freq: Three times a day (TID) | INTRAVENOUS | Status: DC
  Administered 2012-04-06 – 2012-04-08 (×6): 3.375 g via INTRAVENOUS
  Filled 2012-04-06 (×8): qty 50

## 2012-04-06 MED ORDER — DIPHENHYDRAMINE HCL 25 MG PO CAPS
25.0000 mg | ORAL_CAPSULE | Freq: Four times a day (QID) | ORAL | Status: DC | PRN
  Administered 2012-04-06: 25 mg via ORAL
  Filled 2012-04-06: qty 1

## 2012-04-06 NOTE — Progress Notes (Signed)
Patient only allowed me to turn her onto her left side once throughout the side. I tried multiple times to turn her again and educated her on the importance of preventing sores and skin breakdown.  Pt states "I am comfortable where I am now." Pt unwilling to turn to her other side. She has been sleeping and resting comfortably.

## 2012-04-06 NOTE — Progress Notes (Signed)
PT Cancellation Note  ___Treatment cancelled today due to medical issues with patient which prohibited   therapy  ___ Treatment cancelled today due to patient receiving procedure or test   ___ Treatment cancelled today due to patient's refusal to participate   _x__ Treatment cancelled today due to pt's decline to get up today. States she is comfortable and has moved around in bed.  [name] PT [phone]

## 2012-04-06 NOTE — Progress Notes (Signed)
Every time pt is turned her oxygen level drops to the 70s-80s. Nurse reminds pt to take big deep breaths and oxygen saturation comes back up to 98-99 seconds later. Pt does not complain of SOB but does not tolerate turning well. She is now resting comfortably in bed, with a oxygen level of 99 on 1 L.

## 2012-04-06 NOTE — Progress Notes (Signed)
[facility] PROGRESS NOTE  [name] PYY:[id number] DOB: [birth year] DOA: 03/30/2012 PCP: [name], PA Assessment/Plan:    Principal Problem:   *Femur fracture, left  S/p ORIF on 8/27. Ortho following. As per recommendations, NWB left leg. Off knee immobilizer.  Daily dressing changes with 4x4 guaze and tape  Recommend on on Lovenox for 2 weeks, then ASA 352m bid for 4 weeks  -Oxycodone prn and Robaxin for muscle spasms  - MiraLax and Colace bid  -Iron 3235mtid  [place]    Active Problems:   Fever with leucocytosis  Blood cx negative. pending urine cx  CXR repeated showing a developing left sided pneumonia. UA also suggests UTI  started on levaquin (8/31--)  Still having temp spikes. surgical site appears clean. Will boraden coverage with IV vanco and zosyn or PNA. Resend blood cx. Check doppler LE for DVT   Hypertension  Resume home meds     Acute blood loss anemia  H&H stable after PRBC. Cont iron supplements   History of shingles/postherpetic neuralgia:  Healed rash of shingles across left chest in a bandlike pattern. Completed course of acyclovir. Pain management.   Urinary retention  Bladder scan with over 350 cc noted repeatedly  Foley resumed . Will follow   Code Status:partial  Disposition Plan: to SNF once fever subsides  Brief narrative:  [age]ear old female with history of bilateral TKR, recent episode of shingles approximately 3 weeks ago, hypertension, sustained a mechanical fall at home without any associated symptoms such as dizziness, lightheadedness, chest pain, palpitations or dyspnea. Evaluation in the emergency department revealed left periprosthetic distal femoral comminuted fracture. Orthopedics was consulted and patient was admitted for further management.    Consultants:  Dr OlAlvan Damerocedures:  ORIF on 8/27  Antibiotics:  IV levaquin ( 8/31>> )   vanco and zosyn added (  9/2>>)  HPI/Subjective: Feels tired and c/o pain over the left leg. Again had temp spike to 101 overnight   Objective: Filed Vitals:   04/06/12 0040 04/06/12 0430 04/06/12 0540 04/06/12 0620  BP:   164/85 160/83  Pulse:   82   Temp: 98.6 F (37 C)  99 F (37.2 C)   TempSrc: Oral  Oral   Resp:   18   Height:      Weight:      SpO2:  99% 99% 95%    Intake/Output Summary (Last 24 hours) at 04/06/12 1051 Last data filed at 04/06/12 0700  Gross per 24 hour  Intake   2640 ml  Output   2003 ml  Net    637 ml   Filed Weights   03/30/12 1741 03/30/12 2357  Weight: 68.04 kg (150 lb) 69.4 kg (153 lb)    Exam:  General: Elderly female in NAD  HEENT: no pallor moist oral mucosa  Cardiovascular: NS1&S2, no murmurs  Respiratory: clear b/l, no added sounds  Abdomen: soft, NT, ND, BS+  Ext: Warm,staples over left thigh extending to upper tibia intact. No gross swelling or fluctuation noted. No calf swelling  CNS: AAOX 3    Data Reviewed: Basic Metabolic Panel:  Lab [id number] 04/02/12 0405 04/01/12 0413 03/31/12 0430 03/30/12 1850  NA 133* 133* 134* 136 137  K 4.0 3.4* 4.1 4.0 4.4  CL 102 100 100 104 101  CO2 _0 GLUCOSE 126* 100* 130* 119* 161*  BUN _1 26* 28*  CREATININE 0.75 1.06 0.92 1.06 0.88  CALCIUM 8.3* 8.1* 8.6 8.9 10.0  MG -- -- -- -- --  PHOS -- -- -- -- --   Liver Function Tests: No results found for this basename: AST:5,ALT:5,ALKPHOS:5,BILITOT:5,PROT:5,ALBUMIN:5 in the last 168 hours No results found for this basename: LIPASE:5,AMYLASE:5 in the last 168 hours No results found for this basename: AMMONIA:5 in the last 168 hours CBC:  Lab 04/05/12 0613 04/04/12 0928 04/03/12 0025 04/02/12 1755 04/02/12 0405 04/01/12 0413 03/30/12 1850  WBC 13.5* 13.4* 13.2* -- 11.9* 14.3* --  NEUTROABS -- -- 10.1* -- -- -- 16.3*  HGB 8.7* 8.8* 9.6* 10.5* 6.7* -- --  HCT 25.8* 26.0* 28.1* 30.9* 20.0* -- --  MCV 90.5 90.3 89.2 -- 91.3 91.0 --  PLT 267  258 211 -- 219 280 --   Cardiac Enzymes:  Lab 03/30/12 1850  CKTOTAL 94  CKMB --  CKMBINDEX --  TROPONINI --   BNP (last 3 results) No results found for this basename: PROBNP:3 in the last 8760 hours CBG:  Lab 03/31/12 1643 03/31/12 0822  GLUCAP 129* 123*    Recent Results (from the past 240 hour(s))  SURGICAL PCR SCREEN     Status: Abnormal   Collection Time   03/31/12  1:10 AM      Component Value Range Status Comment   MRSA, PCR POSITIVE (*) NEGATIVE Final    Staphylococcus aureus POSITIVE (*) NEGATIVE Final   CULTURE, BLOOD (ROUTINE X 2)     Status: Normal (Preliminary result)   Collection Time   04/03/12 12:25 AM      Component Value Range Status Comment   Specimen Description BLOOD RIGHT ANTECUBITAL   Final    Special Requests BOTTLES DRAWN AEROBIC AND ANAEROBIC 4 CC EA   Final    Culture  Setup Time 04/03/2012 04:40   Final    Culture     Final    Value:        BLOOD CULTURE RECEIVED NO GROWTH TO DATE CULTURE WILL BE HELD FOR 5 DAYS BEFORE ISSUING A FINAL NEGATIVE REPORT   Report Status PENDING   Incomplete   CULTURE, BLOOD (ROUTINE X 2)     Status: Normal (Preliminary result)   Collection Time   04/03/12 12:35 AM      Component Value Range Status Comment   Specimen Description BLOOD LEFT HAND   Final    Special Requests BOTTLES DRAWN AEROBIC ONLY 1 CC   Final    Culture  Setup Time 04/03/2012 04:40   Final    Culture     Final    Value:        BLOOD CULTURE RECEIVED NO GROWTH TO DATE CULTURE WILL BE HELD FOR 5 DAYS BEFORE ISSUING A FINAL NEGATIVE REPORT   Report Status PENDING   Incomplete      Studies: Dg Ribs Unilateral W/chest Left  03/30/2012  *RADIOLOGY REPORT*  Clinical Data: [place] today.  The patient reports no chest symptoms at this time.  LEFT RIBS AND CHEST - 3+ VIEW  Comparison: Chest dated 08/22/2009.  Findings: Normal sized heart.  Tortuous aorta.  Small hiatal hernia.  Clear lungs.  Mild diffuse peribronchial thickening and prominence of the  interstitial markings.  Old, healed right rib fractures.  No acute rib fracture or pneumothorax seen.  Diffuse osteopenia.  IMPRESSION:  1.  No acute fracture. 2.  Small hiatal hernia. 3.  Mild chronic bronchitic changes.   Original Report Authenticated By: Gerald Stabs, M.D.    Dg Hip Complete  Left  03/30/2012  *RADIOLOGY REPORT*  Clinical Data: Left femur pain following a fall today.  LEFT HIP - COMPLETE 2+ VIEW  Comparison: None.  Findings: Diffuse osteopenia.  No fracture or dislocation seen.  IMPRESSION: No fracture or dislocation.   Original Report Authenticated By: Gerald Stabs, M.D.    Dg Femur Left  03/31/2012  *RADIOLOGY REPORT*  Clinical Data: Fracture fixation  DG C-ARM 61-120 MIN - NRPT [facility], LEFT FEMUR - 2 VIEW  Comparison: Plain films 03/30/2012.  Findings: We are provided with six fluoroscopic spot views of the left femur.  Images demonstrate placement of a plate and screws and a cerclage wire for fixation of a distal femur fracture.  Position and alignment appear near anatomic.  Knee replacement noted.  IMPRESSION: ORIF distal left femur fracture.   Original Report Authenticated By: Arvid Right. D'ALESSIO, M.D.    Dg Knee 1-2 Views Left  03/30/2012  *RADIOLOGY REPORT*  Clinical Data: Left femur and knee pain following a fall today.  LEFT KNEE - 1-2 VIEW  Comparison: None.  Findings: Comminuted fracture of the distal shaft and metadiaphysis of the femur with medial and posterior angulation of the distal fragment as well as approximately one half shaft width of medial posterior displacement of the distal fragment.  There is also a medially displaced middle fragment.  A total knee prosthesis is also noted.  Diffuse osteopenia.  IMPRESSION: Comminuted distal femur fracture, as described above.   Original Report Authenticated By: Gerald Stabs, M.D.    Dg Chest Port 1 View  04/04/2012  *RADIOLOGY REPORT*  Clinical Data: Postop ORIF left femur fracture.  Follow up basilar atelectasis.   PORTABLE CHEST - 1 VIEW 04/04/2012 1111 hours:  Comparison: Portable chest x-ray yesterday and one-view chest x-ray 03/30/2012.  Findings: Cardiac silhouette mildly enlarged but stable.  Pulmonary vascularity normal without evidence of pulmonary edema.  Stable linear atelectasis in the lung bases.  Focal airspace opacity with air bronchograms in the medial left lung base.  Probable small effusions, unchanged.  IMPRESSION: Stable mild bibasilar atelectasis.  Developing pneumonia suspected in the medial left lung base.  Stable mild cardiomegaly without pulmonary edema.   Original Report Authenticated By: Deniece Portela, M.D.    Dg Chest Port 1 View  04/03/2012  *RADIOLOGY REPORT*  Clinical Data: Postoperative fever.  PORTABLE CHEST - 1 VIEW  Comparison: Plain film chest 03/30/2012.  Findings: Lung volumes are low with basilar atelectasis.  There appear to be very small bilateral pleural effusions.  Heart size is normal.  No pneumothorax.  IMPRESSION: Mild bibasilar atelectasis in a low-volume chest.  Very small bilateral pleural effusions are identified.   Original Report Authenticated By: Arvid Right. D'ALESSIO, M.D.    Dg C-arm 61-120 Min-no Report  03/31/2012  *RADIOLOGY REPORT*  Clinical Data: Fracture fixation  DG C-ARM 61-120 MIN - NRPT [facility], LEFT FEMUR - 2 VIEW  Comparison: Plain films 03/30/2012.  Findings: We are provided with six fluoroscopic spot views of the left femur.  Images demonstrate placement of a plate and screws and a cerclage wire for fixation of a distal femur fracture.  Position and alignment appear near anatomic.  Knee replacement noted.  IMPRESSION: ORIF distal left femur fracture.   Original Report Authenticated By: Arvid Right. D'ALESSIO, M.D.     Scheduled Meds:   . doxazosin  8 mg Oral QHS  . enoxaparin (LOVENOX) injection  40 mg Subcutaneous Q24H  . ferrous sulfate  325 mg Oral TID PC  .  losartan  100 mg Oral Daily   And  . hydrochlorothiazide  25 mg Oral Daily  .  levofloxacin (LEVAQUIN) IV  750 mg Intravenous Q24H  . Nebivolol HCl  20 mg Oral q morning - 10a  . pantoprazole  40 mg Oral Q1200  . piperacillin-tazobactam (ZOSYN)  IV  3.375 g Intravenous Q8H  . polyethylene glycol  17 g Oral BID  . senna-docusate  1 tablet Oral BID  . vancomycin  750 mg Intravenous Q12H   Continuous Infusions:   . sodium chloride 75 mL/hr at 04/05/12 1900      Time spent: [place], [address] [facility] Pager [phone] If 8PM-8AM, please contact night-coverage at [email or web address], password [facility] 04/06/2012, 10:51 AM  LOS: 7 days

## 2012-04-06 NOTE — Progress Notes (Signed)
[name]  MRN: [id number] DOB/Age: October 05, 1931 78 y.o. Physician: Ander Slade, M.D. 6 Days Post-Op Procedure(s) (LRB): OPEN REDUCTION INTERNAL FIXATION (ORIF) PERIPROSTHETIC FRACTURE (Left)  Subjective: Reports ongoing left knee pain but improving. Good appetite. Vital Signs Temp:  [98.6 F (37 C)-101.6 F (38.7 C)] 99 F (37.2 C) (09/02 0540) Pulse Rate:  [77-82] 82  (09/02 0540) Resp:  [16-94] 18  (09/02 0540) BP: (134-164)/(68-85) 160/83 mmHg (09/02 0620) SpO2:  [18 %-99 %] 95 % (09/02 0620)  Lab Results  Basename 04/05/12 0613 04/04/12 0928  WBC 13.5* 13.4*  HGB 8.7* 8.8*  HCT 25.8* 26.0*  PLT 267 258   BMET No results found for this basename: NA:2,K:2,CL:2,CO2:2,GLUCOSE:2,BUN:2,CREATININE:2,CALCIUM:2 in the last 72 hours INR  Date Value Range Status  12/21/2008 2.4* 0.00 - 1.49 Final     Exam  Left lateral thigh/knee incision healing well without drainage, thigh soft, diffusely tender, no erythema or induration  Plan Continue NWB LLE, on ABX for presumptive pneumonia. Charlene Cowdrey M 04/06/2012, 11:07 AM

## 2012-04-06 NOTE — Progress Notes (Signed)
ANTIBIOTIC CONSULT NOTE - INITIAL  Pharmacy Consult for Vancomycin  Indication: Pneumonia   Allergies  Allergen Reactions  . Sulfa Antibiotics     Patient Measurements: Height: _0  (157.5 cm) Weight: 153 lb (69.4 kg) IBW/kg (Calculated) : 50.1    Vital Signs: Temp: 99 F (37.2 C) (09/02 0540) Temp src: Oral (09/02 0540) BP: 160/83 mmHg (09/02 0620) Pulse Rate: 82  (09/02 0540) Intake/Output from previous day: 09/01 0701 - 09/02 0700 In: 2820 [P.O.:720; I.V.:1800; IV Piggyback:300] Out: 2003 [Urine:2000; Stool:3] Intake/Output from this shift:    Labs:  [facility] 04/05/12 0613 04/04/12 0928  WBC 13.5* 13.4*  HGB 8.7* 8.8*  PLT 267 258  LABCREA -- --  CREATININE -- --   Estimated Creatinine Clearance: 52.9 ml/min (by C-G formula based on Cr of 0.75). No results found for this basename: VANCOTROUGH:2,VANCOPEAK:2,VANCORANDOM:2,GENTTROUGH:2,GENTPEAK:2,GENTRANDOM:2,TOBRATROUGH:2,TOBRAPEAK:2,TOBRARND:2,AMIKACINPEAK:2,AMIKACINTROU:2,AMIKACIN:2, in the last 72 hours   Microbiology: Recent Results (from the past 720 hour(s))  SURGICAL PCR SCREEN     Status: Abnormal   Collection Time   03/31/12  1:10 AM      Component Value Range Status Comment   MRSA, PCR POSITIVE (*) NEGATIVE Final    Staphylococcus aureus POSITIVE (*) NEGATIVE Final   CULTURE, BLOOD (ROUTINE X 2)     Status: Normal (Preliminary result)   Collection Time   04/03/12 12:25 AM      Component Value Range Status Comment   Specimen Description BLOOD RIGHT ANTECUBITAL   Final    Special Requests BOTTLES DRAWN AEROBIC AND ANAEROBIC 4 CC EA   Final    Culture  Setup Time 04/03/2012 04:40   Final    Culture     Final    Value:        BLOOD CULTURE RECEIVED NO GROWTH TO DATE CULTURE WILL BE HELD FOR 5 DAYS BEFORE ISSUING A FINAL NEGATIVE REPORT   Report Status PENDING   Incomplete   CULTURE, BLOOD (ROUTINE X 2)     Status: Normal (Preliminary result)   Collection Time   04/03/12 12:35 AM      Component Value  Range Status Comment   Specimen Description BLOOD LEFT HAND   Final    Special Requests BOTTLES DRAWN AEROBIC ONLY 1 CC   Final    Culture  Setup Time 04/03/2012 04:40   Final    Culture     Final    Value:        BLOOD CULTURE RECEIVED NO GROWTH TO DATE CULTURE WILL BE HELD FOR 5 DAYS BEFORE ISSUING A FINAL NEGATIVE REPORT   Report Status PENDING   Incomplete     Medical History: Past Medical History  Diagnosis Date  . Hypertension   . Shingles   . Back pain   . Intestinal obstruction   . Complication of anesthesia     resp distress -on vent after surgery    Medications:  Scheduled:    . doxazosin  8 mg Oral QHS  . enoxaparin (LOVENOX) injection  40 mg Subcutaneous Q24H  . ferrous sulfate  325 mg Oral TID PC  . losartan  100 mg Oral Daily   And  . hydrochlorothiazide  25 mg Oral Daily  . levofloxacin (LEVAQUIN) IV  750 mg Intravenous Q24H  . Nebivolol HCl  20 mg Oral q morning - 10a  . pantoprazole  40 mg Oral Q1200  . piperacillin-tazobactam (ZOSYN)  IV  3.375 g Intravenous Q8H  . polyethylene glycol  17 g Oral BID  . senna-docusate  1 tablet Oral BID  . vancomycin  750 mg Intravenous Q12H   Infusions:    . sodium chloride 75 mL/hr at 04/05/12 1900   PRN: acetaminophen, ALPRAZolam, alum & mag hydroxide-simeth, menthol-cetylpyridinium, methocarbamol (ROBAXIN) IV, methocarbamol, metoCLOPramide (REGLAN) injection, metoCLOPramide, ondansetron (ZOFRAN) IV, ondansetron, oxyCODONE, phenol, promethazine, temazepam, tiZANidine Assessment:  78 yo F s/p ORIF on 8/27 now with L sided PNA and UA suggestive of UTI.  Starting Vancomycin and Zosyn today, Levaquin started on 8/31.    Febrile last night 101.6, AF this morning  WBC elevated 13.5  Scr WNL with CrCl 53 ml/min   Goal of Therapy:  Vancomycin trough level 15-20 mcg/ml  Plan:  1.) Vancomycin 750 mg IV q12h 2.) Continue Zosyn 3.375 gm IV q8h 3.) If no suspicion for atypical PNA recommend discontinuing Levaquin as  zosyn should cover urinary bacteria in UTI.   4.) Monitor renal function, CBC, Check Vancomycin levels at [facility]  Dianah Pruett, Gaye Alken PharmD Pager #: [phone] 8:08 AM 04/06/2012

## 2012-04-06 NOTE — Progress Notes (Signed)
Bilateral:  No evidence of DVT, superficial thrombosis, or Baker's Cyst.

## 2012-04-07 LAB — CBC
Hemoglobin: 8 g/dL — ABNORMAL LOW (ref 12.0–15.0)
MCH: 29.9 pg (ref 26.0–34.0)
MCHC: 33.1 g/dL (ref 30.0–36.0)
Platelets: 335 10*3/uL (ref 150–400)
RBC: 2.68 MIL/uL — ABNORMAL LOW (ref 3.87–5.11)

## 2012-04-07 MED ORDER — ENSURE COMPLETE PO LIQD
237.0000 mL | Freq: Three times a day (TID) | ORAL | Status: DC
  Administered 2012-04-08: 237 mL via ORAL

## 2012-04-07 NOTE — Progress Notes (Signed)
Occupational Therapy Treatment Patient Details Name: [name] MRN: [id number] DOB: 12/10/1931 Today's Date: 04/07/2012 Time: 3685-9923 OT Time Calculation (min): 34 min  OT Assessment / Plan / Recommendation Comments on Treatment Session Pt making little to know progress. Pt continues to be very fearful of mobility. D/C planned for snf today.    Follow Up Recommendations  Skilled nursing facility    Barriers to Discharge       Equipment Recommendations  Defer to next venue    Recommendations for Other Services    Frequency Min 1X/week   Plan Discharge plan remains appropriate    Precautions / Restrictions Precautions Precautions: Fall Required Braces or Orthoses: Knee Immobilizer - Left Knee Immobilizer - Left: On at all times Restrictions Weight Bearing Restrictions: Yes LLE Weight Bearing: Non weight bearing   Pertinent Vitals/Pain Pt reported 8/10 pain with mobility. Repositioned for comfort    ADL  Grooming: Performed;Wash/dry face Where Assessed - Grooming: Unsupported sitting Toilet Transfer: Performed;+2 Total assistance Toilet Transfer: Patient Percentage: 10% Toilet Transfer Method: Engineer, water: Other (comment) (recliner) Toileting - Clothing Manipulation and Hygiene: +2 Total assistance Toileting - Clothing Manipulation and Hygiene: Patient Percentage: 0% Where Assessed - Toileting Clothing Manipulation and Hygiene: Supine, head of bed flat;Rolling right and/or left ADL Comments: Pt incontinent of bowel and required cleanup in the bed. Pt extremely weak and has max difficulty bearing weight through arms and non-operated leg.    OT Diagnosis:    OT Problem List:   OT Treatment Interventions:     OT Goals ADL Goals ADL Goal: Grooming - Progress: Progressing toward goals ADL Goal: Toilet Transfer - Progress: Not progressing ADL Goal: Toileting - Clothing Manipulation - Progress: Not progressing ADL Goal: Toileting - Hygiene -  Progress: Not progressing  Visit Information  Last OT Received On: 04/07/12 Assistance Needed: +2 PT/OT Co-Evaluation/Treatment: Yes    Subjective Data  Subjective: I havent been up in 4 days.   Prior Functioning       Cognition  Overall Cognitive Status: Appears within functional limits for tasks assessed/performed Arousal/Alertness: Awake/alert Orientation Level: Appears intact for tasks assessed Behavior During Session: [facility] for tasks performed    Mobility  Shoulder Instructions Bed Mobility Supine to Sit: 1: +2 Total assist Supine to Sit: Patient Percentage: 0% Details for Bed Mobility Assistance: assistance to move each leg and to get into upright sitting at edge. Pt. presents as being fearful. Transfers Sit to Stand: 1: +2 Total assist;From elevated surface Sit to Stand: Patient Percentage: 10% Stand to Sit: 1: +2 Total assist Stand to Sit: Patient Percentage: 10% Transfer via Lift Equipment: [place] Details for Transfer Assistance: Pt was able to tand on RLE but unable to take any steps. Tendancy to  bear weight on LLE. Pt. had to be assited to rotate on RLE in order to turn to recliner. Used RW. Stood  x 2, first to get cleaned up.       Exercises      Balance Static Sitting Balance Static Sitting - Balance Support: Bilateral upper extremity supported Static Sitting - Level of Assistance: 4: Min assist   End of Session OT - End of Session Equipment Utilized During Treatment: Gait belt Activity Tolerance: Patient limited by pain;Other (comment) (and anxiety) Patient left: in chair;with call bell/phone within reach Nurse Communication: Need for lift equipment  [place] A OTR/L [phone] 04/07/2012, 10:11 AM

## 2012-04-07 NOTE — Progress Notes (Signed)
Patient has a bed at [facility] when medically stable for discharge. Anticipating discharge tomorrow. Daughter, [name] made aware. CSW to check back tomorrow.   Winfred Leeds, [facility] Clinical Social Worker cell #: [phone]

## 2012-04-07 NOTE — Progress Notes (Signed)
Pt is still reluctant to be turned in bed every 2 hours. She states that she has been moving the right leg around. I reinforced the importance of turning to prevent skin breakdown, especially with the loose stool putting her at increased risk. Pt is now laying comfortably on her R side.

## 2012-04-07 NOTE — Progress Notes (Signed)
Physical Therapy Treatment Patient Details Name: [name] MRN: [id number] DOB: 1931/10/30 Today's Date: 04/07/2012 Time: 0828-0902 PT Time Calculation (min): 34 min  PT Assessment / Plan / Recommendation Comments on Treatment Session  Pt. did  participate w/ therapies today. Pt did stand but is requiring extensive asssitance. Pt plans SNF .    Follow Up Recommendations  Skilled nursing facility    Barriers to Discharge        Equipment Recommendations  Defer to next venue    Recommendations for Other Services    Frequency Min 3X/week   Plan Discharge plan remains appropriate;Frequency remains appropriate    Precautions / Restrictions Precautions Required Braces or Orthoses: Knee Immobilizer - Left Knee Immobilizer - Left: On at all times Restrictions Weight Bearing Restrictions: Yes LLE Weight Bearing: Non weight bearing   Pertinent Vitals/Pain 8/10 L thigh. Had pain med , requested muscle relaxer.    Mobility  Bed Mobility Supine to Sit: 1: +2 Total assist Supine to Sit: Patient Percentage: 0% Details for Bed Mobility Assistance: assistance to move each leg and to get into upright sitting at edge. Pt. presents as being fearful. Transfers Transfers: Sit to Stand;Stand to Sit;Stand Pivot Transfers Sit to Stand: 1: +2 Total assist;From elevated surface Sit to Stand: Patient Percentage: 10% Stand to Sit: 1: +2 Total assist Stand to Sit: Patient Percentage: 10% Stand Pivot Transfers: 1: +2 Total assist Stand Pivot Transfers: Patient Percentage: 20% Transfer via Lift Equipment: [place] Details for Transfer Assistance: Pt was able to tand on RLE but unable to take any steps. Tendancy to  bear weight on LLE. Pt. had to be assited to rotate on RLE in order to turn to recliner. Used RW. Stood  x 2, first to get cleaned up.    Exercises     PT Diagnosis:    PT Problem List:   PT Treatment Interventions:     PT Goals Acute Rehab PT Goals Pt will go Supine/Side to Sit:  with min assist PT Goal: Supine/Side to Sit - Progress: Progressing toward goal Pt will go Sit to Stand: with min assist PT Goal: Sit to Stand - Progress: Progressing toward goal Pt will go Stand to Sit: with min assist PT Goal: Stand to Sit - Progress: Progressing toward goal  Visit Information  Last PT Received On: 04/07/12 Assistance Needed: +2 PT/OT Co-Evaluation/Treatment: Yes    Subjective Data  Subjective: I don't know when I will have a bBM   Cognition  Overall Cognitive Status: Appears within functional limits for tasks assessed/performed Behavior During Session: Anxious    Balance  Static Sitting Balance Static Sitting - Balance Support: Bilateral upper extremity supported Static Sitting - Level of Assistance: 4: Min assist  End of Session PT - End of Session Equipment Utilized During Treatment: Gait belt;Left knee immobilizer Activity Tolerance: Patient limited by pain Patient left: in chair;with call bell/phone within reach Nurse Communication: Mobility status;Need for lift equipment   GP     Claretha Cooper 04/07/2012, 9:46 AM [phone]

## 2012-04-07 NOTE — Progress Notes (Signed)
[facility] PROGRESS NOTE  [name] EXB:[id number] DOB: July 09, 1932 DOA: 03/30/2012 PCP: [name], PA  Assessment/Plan:   Principal Problem:  *Femur fracture, left  S/p ORIF on 8/27. Ortho following. As per recommendations, NWB left leg. Off knee immobilizer.  Daily dressing changes with 4x4 guaze and tape  Recommend on on Lovenox for 2 weeks, then ASA 357m bid for 4 weeks  -Oxycodone prn and Robaxin for muscle spasms  - MiraLax and Colace bid  -Iron 3242mtid  Follow up with [facility] 2 weeks   Active Problems:  Fever with leucocytosis  Blood cx negative. pending urine cx  CXR repeated showing a developing left sided pneumonia. UA also suggests UTI . started on levaquin (8/31--)  Still having temp spikes although less pronounced . Wbc improving in am labs. surgical site appears clean. boradened coverage with IV vanco and zosyn for PNA and UTI on 9/2 . Repeated blood cx.  Follow urine cx doppler LE for DVT negative  Hypertension  Cont home meds   Acute blood loss anemia  H&H stable after PRBC. Cont iron supplements   History of shingles/postherpetic neuralgia:  Healed rash of shingles across left chest in a bandlike pattern. Completed course of acyclovir.  Urinary retention  Bladder scan with over 350 cc noted repeatedly  Foley resumed . Will follow   Code Status:partial   Disposition Plan: to SNF once fever subsides   Brief narrative:  [age]ear old female with history of bilateral TKR, recent episode of shingles approximately 3 weeks ago, hypertension, sustained a mechanical fall at home without any associated symptoms such as dizziness, lightheadedness, chest pain, palpitations or dyspnea. Evaluation in the emergency department revealed left periprosthetic distal femoral comminuted fracture. Orthopedics was consulted and patient was admitted for further management. Hospital course prolonged with fever post op.  Consultants:  Dr  OlAlvan DameProcedures:  ORIF on 8/27   Antibiotics:  IV levaquin ( 8/31>> )  vanco and zosyn added ( 9/2>>)   HPI/Subjective: C/o pain over left leg ( surgical site) . tmax of 100.8    Objective: Filed Vitals:   04/07/12 0416 04/07/12 0503 04/07/12 0539 04/07/12 0800  BP:  167/81 162/72   Pulse:  69    Temp:  98.7 F (37.1 C)    TempSrc:  Oral    Resp:  18  16  Height:      Weight:      SpO2: 98% 99%  100%    Intake/Output Summary (Last 24 hours) at 04/07/12 1241 Last data filed at 04/07/12 0755  Gross per 24 hour  Intake   2310 ml  Output   2401 ml  Net    -91 ml   Filed Weights   03/30/12 1741 03/30/12 2357  Weight: 68.04 kg (150 lb) 69.4 kg (153 lb)    Exam:  General: Elderly female in NAD  HEENT: no pallor moist oral mucosa  Cardiovascular: NS1&S2, no murmurs  Respiratory: clear b/l, no added sounds  Abdomen: soft, NT, ND, BS+  Ext: Warm,staples over left thigh extending to upper tibia intact. No gross swelling or fluctuation noted. No calf swelling  CNS: AAOX 3    Data Reviewed: Basic Metabolic Panel:  Lab 0810/27/25025 04/02/12 0405 04/01/12 0413  NA 133* 133* 134*  K 4.0 3.4* 4.1  CL 102 100 100  CO2 _0 GLUCOSE 126* 100* 130*  BUN _1 CREATININE 0.75 1.06 0.92  CALCIUM 8.3* 8.1* 8.6  MG -- -- --  PHOS -- -- --   Liver Function Tests: No results found for this basename: AST:5,ALT:5,ALKPHOS:5,BILITOT:5,PROT:5,ALBUMIN:5 in the last 168 hours No results found for this basename: LIPASE:5,AMYLASE:5 in the last 168 hours No results found for this basename: AMMONIA:5 in the last 168 hours CBC:  Lab 04/07/12 0456 04/05/12 0613 04/04/12 0928 04/03/12 0025 04/02/12 1755 04/02/12 0405  WBC 11.7* 13.5* 13.4* 13.2* -- 11.9*  NEUTROABS -- -- -- 10.1* -- --  HGB 8.0* 8.7* 8.8* 9.6* 10.5* --  HCT 24.2* 25.8* 26.0* 28.1* 30.9* --  MCV 90.3 90.5 90.3 89.2 -- 91.3  PLT 335 267 258 211 -- 219   Cardiac Enzymes: No results found for this  basename: CKTOTAL:5,CKMB:5,CKMBINDEX:5,TROPONINI:5 in the last 168 hours BNP (last 3 results) No results found for this basename: PROBNP:3 in the last 8760 hours CBG:  Lab 03/31/12 1643  GLUCAP 129*    Recent Results (from the past 240 hour(s))  SURGICAL PCR SCREEN     Status: Abnormal   Collection Time   03/31/12  1:10 AM      Component Value Range Status Comment   MRSA, PCR POSITIVE (*) NEGATIVE Final    Staphylococcus aureus POSITIVE (*) NEGATIVE Final   CULTURE, BLOOD (ROUTINE X 2)     Status: Normal (Preliminary result)   Collection Time   04/03/12 12:25 AM      Component Value Range Status Comment   Specimen Description BLOOD RIGHT ANTECUBITAL   Final    Special Requests BOTTLES DRAWN AEROBIC AND ANAEROBIC 4 CC EA   Final    Culture  Setup Time 04/03/2012 04:40   Final    Culture     Final    Value:        BLOOD CULTURE RECEIVED NO GROWTH TO DATE CULTURE WILL BE HELD FOR 5 DAYS BEFORE ISSUING A FINAL NEGATIVE REPORT   Report Status PENDING   Incomplete   CULTURE, BLOOD (ROUTINE X 2)     Status: Normal (Preliminary result)   Collection Time   04/03/12 12:35 AM      Component Value Range Status Comment   Specimen Description BLOOD LEFT HAND   Final    Special Requests BOTTLES DRAWN AEROBIC ONLY 1 CC   Final    Culture  Setup Time 04/03/2012 04:40   Final    Culture     Final    Value:        BLOOD CULTURE RECEIVED NO GROWTH TO DATE CULTURE WILL BE HELD FOR 5 DAYS BEFORE ISSUING A FINAL NEGATIVE REPORT   Report Status PENDING   Incomplete   URINE CULTURE     Status: Normal (Preliminary result)   Collection Time   04/04/12  1:12 PM      Component Value Range Status Comment   Specimen Description URINE, CATHETERIZED   Final    Special Requests NONE   Final    Culture  Setup Time 04/04/2012 17:55   Final    Colony Count >=100,000 COLONIES/ML   Final    Culture     Final    Value: GRAM NEGATIVE RODS     ESCHERICHIA COLI   Report Status PENDING   Incomplete   CULTURE, BLOOD  (ROUTINE X 2)     Status: Normal (Preliminary result)   Collection Time   04/06/12  8:00 AM      Component Value Range Status Comment   Specimen Description BLOOD LEFT ARM   Final    Special Requests BOTTLES DRAWN AEROBIC  AND ANAEROBIC 5CC EACH   Final    Culture  Setup Time 04/06/2012 15:24   Final    Culture     Final    Value:        BLOOD CULTURE RECEIVED NO GROWTH TO DATE CULTURE WILL BE HELD FOR 5 DAYS BEFORE ISSUING A FINAL NEGATIVE REPORT   Report Status PENDING   Incomplete   CULTURE, BLOOD (ROUTINE X 2)     Status: Normal (Preliminary result)   Collection Time   04/06/12  8:10 AM      Component Value Range Status Comment   Specimen Description BLOOD RIGHT HAND   Final    Special Requests BOTTLES DRAWN AEROBIC AND ANAEROBIC 5CC EACH   Final    Culture  Setup Time 04/06/2012 15:24   Final    Culture     Final    Value:        BLOOD CULTURE RECEIVED NO GROWTH TO DATE CULTURE WILL BE HELD FOR 5 DAYS BEFORE ISSUING A FINAL NEGATIVE REPORT   Report Status PENDING   Incomplete      Studies: Dg Ribs Unilateral W/chest Left  03/30/2012  *RADIOLOGY REPORT*  Clinical Data: [place] today.  The patient reports no chest symptoms at this time.  LEFT RIBS AND CHEST - 3+ VIEW  Comparison: Chest dated 08/22/2009.  Findings: Normal sized heart.  Tortuous aorta.  Small hiatal hernia.  Clear lungs.  Mild diffuse peribronchial thickening and prominence of the interstitial markings.  Old, healed right rib fractures.  No acute rib fracture or pneumothorax seen.  Diffuse osteopenia.  IMPRESSION:  1.  No acute fracture. 2.  Small hiatal hernia. 3.  Mild chronic bronchitic changes.   Original Report Authenticated By: Gerald Stabs, M.D.    Dg Hip Complete Left  03/30/2012  *RADIOLOGY REPORT*  Clinical Data: Left femur pain following a fall today.  LEFT HIP - COMPLETE 2+ VIEW  Comparison: None.  Findings: Diffuse osteopenia.  No fracture or dislocation seen.  IMPRESSION: No fracture or dislocation.   Original  Report Authenticated By: Gerald Stabs, M.D.    Dg Femur Left  03/31/2012  *RADIOLOGY REPORT*  Clinical Data: Fracture fixation  DG C-ARM 61-120 MIN - NRPT [facility], LEFT FEMUR - 2 VIEW  Comparison: Plain films 03/30/2012.  Findings: We are provided with six fluoroscopic spot views of the left femur.  Images demonstrate placement of a plate and screws and a cerclage wire for fixation of a distal femur fracture.  Position and alignment appear near anatomic.  Knee replacement noted.  IMPRESSION: ORIF distal left femur fracture.   Original Report Authenticated By: Arvid Right. D'ALESSIO, M.D.    Dg Knee 1-2 Views Left  03/30/2012  *RADIOLOGY REPORT*  Clinical Data: Left femur and knee pain following a fall today.  LEFT KNEE - 1-2 VIEW  Comparison: None.  Findings: Comminuted fracture of the distal shaft and metadiaphysis of the femur with medial and posterior angulation of the distal fragment as well as approximately one half shaft width of medial posterior displacement of the distal fragment.  There is also a medially displaced middle fragment.  A total knee prosthesis is also noted.  Diffuse osteopenia.  IMPRESSION: Comminuted distal femur fracture, as described above.   Original Report Authenticated By: Gerald Stabs, M.D.    Dg Chest Port 1 View  04/04/2012  *RADIOLOGY REPORT*  Clinical Data: Postop ORIF left femur fracture.  Follow up basilar atelectasis.  PORTABLE CHEST - 1  VIEW 04/04/2012 1111 hours:  Comparison: Portable chest x-ray yesterday and one-view chest x-ray 03/30/2012.  Findings: Cardiac silhouette mildly enlarged but stable.  Pulmonary vascularity normal without evidence of pulmonary edema.  Stable linear atelectasis in the lung bases.  Focal airspace opacity with air bronchograms in the medial left lung base.  Probable small effusions, unchanged.  IMPRESSION: Stable mild bibasilar atelectasis.  Developing pneumonia suspected in the medial left lung base.  Stable mild cardiomegaly without  pulmonary edema.   Original Report Authenticated By: Deniece Portela, M.D.    Dg Chest Port 1 View  04/03/2012  *RADIOLOGY REPORT*  Clinical Data: Postoperative fever.  PORTABLE CHEST - 1 VIEW  Comparison: Plain film chest 03/30/2012.  Findings: Lung volumes are low with basilar atelectasis.  There appear to be very small bilateral pleural effusions.  Heart size is normal.  No pneumothorax.  IMPRESSION: Mild bibasilar atelectasis in a low-volume chest.  Very small bilateral pleural effusions are identified.   Original Report Authenticated By: Arvid Right. D'ALESSIO, M.D.    Dg C-arm 61-120 Min-no Report  03/31/2012  *RADIOLOGY REPORT*  Clinical Data: Fracture fixation  DG C-ARM 61-120 MIN - [facility]PT [facility], LEFT FEMUR - 2 VIEW  Comparison: Plain films 03/30/2012.  Findings: We are provided with six fluoroscopic spot views of the left femur.  Images demonstrate placement of a plate and screws and a cerclage wire for fixation of a distal femur fracture.  Position and alignment appear near anatomic.  Knee replacement noted.  IMPRESSION: ORIF distal left femur fracture.   Original Report Authenticated By: Arvid Right. D'ALESSIO, M.D.     Scheduled Meds:   . doxazosin  8 mg Oral QHS  . enoxaparin (LOVENOX) injection  40 mg Subcutaneous Q24H  . ferrous sulfate  325 mg Oral TID PC  . losartan  100 mg Oral Daily   And  . hydrochlorothiazide  25 mg Oral Daily  . levofloxacin (LEVAQUIN) IV  750 mg Intravenous Q24H  . Nebivolol HCl  20 mg Oral q morning - 10a  . pantoprazole  40 mg Oral Q1200  . piperacillin-tazobactam (ZOSYN)  IV  3.375 g Intravenous Q8H  . polyethylene glycol  17 g Oral BID  . senna-docusate  1 tablet Oral BID  . vancomycin  750 mg Intravenous Q12H   Continuous Infusions:   . sodium chloride 75 mL/hr at 04/07/12 0701     Time spent:30 minutes    [name]  [facility] Pager [phone]. If 8PM-8AM, please contact night-coverage at [email or web address], password [facility] 04/07/2012,  12:41 PM  LOS: 8 days

## 2012-04-07 NOTE — Progress Notes (Signed)
INITIAL ADULT NUTRITION ASSESSMENT Date: 04/07/2012   Time: 2:27 PM Reason for Assessment: Nutrition risk   ASSESSMENT: Female 78 y.o.  Dx: Femur fracture, left   INTERVENTION: 1. Will order patient chocolate Ensure TID, provides 750 kcal and 27 grams of protein daily.  2. RD to follow for nutrition plan of care.   Hx:  Past Medical History  Diagnosis Date  . Hypertension   . Shingles   . Back pain   . Intestinal obstruction   . Complication of anesthesia     resp distress -on vent after surgery    Related Meds:  Scheduled Meds:   . doxazosin  8 mg Oral QHS  . enoxaparin (LOVENOX) injection  40 mg Subcutaneous Q24H  . ferrous sulfate  325 mg Oral TID PC  . losartan  100 mg Oral Daily   And  . hydrochlorothiazide  25 mg Oral Daily  . levofloxacin (LEVAQUIN) IV  750 mg Intravenous Q24H  . Nebivolol HCl  20 mg Oral q morning - 10a  . pantoprazole  40 mg Oral Q1200  . piperacillin-tazobactam (ZOSYN)  IV  3.375 g Intravenous Q8H  . polyethylene glycol  17 g Oral BID  . senna-docusate  1 tablet Oral BID  . vancomycin  750 mg Intravenous Q12H   Continuous Infusions:   . DISCONTD: sodium chloride 75 mL/hr at 04/07/12 0701   PRN Meds:.acetaminophen, ALPRAZolam, alum & mag hydroxide-simeth, diphenhydrAMINE, menthol-cetylpyridinium, methocarbamol (ROBAXIN) IV, methocarbamol, metoCLOPramide (REGLAN) injection, metoCLOPramide, ondansetron (ZOFRAN) IV, ondansetron, oxyCODONE, phenol, promethazine, temazepam, tiZANidine   Ht: _0  (157.5 cm)  Wt: 153 lb (69.4 kg)  Ideal Wt: 50.1 kg  % Ideal Wt: 139% Wt Readings from Last 10 Encounters:  03/30/12 153 lb (69.4 kg)  03/30/12 153 lb (69.4 kg)    Usual Wt: 145 lb. Per pt.  % Usual Wt: 105.5%  Body mass index is 27.98 kg/(m^2). (Overweight)  Food/Nutrition Related Hx: Patient reported her appetite and intake have been poor over the past 4 weeks due to shingles. Se reported she has been eating < 50% of her meals. PO intake  documented 15-75% at meals on regular diet. Patient agreed to try Ensure nutrition supplement.   Labs:  CMP     Component Value Date/Time   NA 133* 04/03/2012 0025   K 4.0 04/03/2012 0025   CL 102 04/03/2012 0025   CO2 21 04/03/2012 0025   GLUCOSE 126* 04/03/2012 0025   BUN 12 04/03/2012 0025   CREATININE 0.75 04/03/2012 0025   CALCIUM 8.3* 04/03/2012 0025   GFRNONAA 79* 04/03/2012 0025   GFRAA >90 04/03/2012 0025    Intake/Output Summary (Last 24 hours) at 04/07/12 1431 Last data filed at 04/07/12 0755  Gross per 24 hour  Intake   2070 ml  Output   1750 ml  Net    320 ml     Diet Order: General  Supplements/Tube Feeding: none at this time.   IVF:    DISCONTD: sodium chloride Last Rate: 75 mL/hr at 04/07/12 0701    Estimated Nutritional Needs:   Kcal: 1500-1700 Protein: 84-104 Fluid: 1 ml per kcal intake   NUTRITION DIAGNOSIS: -Inadequate oral intake (NI-2.1).  Status: Ongoing  RELATED TO: poor appetite   AS EVIDENCE BY: pt reported poor appetite and intake over the past month and PO intake documented varies between 15-75% at meals.   MONITORING/EVALUATION(Goals): PO intake, weights, labs, I/O's 1. PO intake > 75% at meals and supplements.   EDUCATION NEEDS: -No education  needs identified at this time  INTERVENTION: 1. Will order patient chocolate Ensure TID, provides 750 kcal and 27 grams of protein daily.  2. RD to follow for nutrition plan of care.   Dietitian [phone]  [name] Per approved criteria  -Not Applicable    [name] [facility] 04/07/2012, 2:27 PM

## 2012-04-08 DIAGNOSIS — N390 Urinary tract infection, site not specified: Secondary | ICD-10-CM

## 2012-04-08 LAB — CBC
MCH: 30.2 pg (ref 26.0–34.0)
MCHC: 33.6 g/dL (ref 30.0–36.0)
MCV: 89.8 fL (ref 78.0–100.0)
Platelets: 380 10*3/uL (ref 150–400)
RDW: 14.6 % (ref 11.5–15.5)
WBC: 11.7 10*3/uL — ABNORMAL HIGH (ref 4.0–10.5)

## 2012-04-08 LAB — BASIC METABOLIC PANEL
BUN: 11 mg/dL (ref 6–23)
Calcium: 8.9 mg/dL (ref 8.4–10.5)
Creatinine, Ser: 0.88 mg/dL (ref 0.50–1.10)
GFR calc Af Amer: 71 mL/min — ABNORMAL LOW (ref >90)
GFR calc non Af Amer: 61 mL/min — ABNORMAL LOW (ref >90)

## 2012-04-08 LAB — URINE CULTURE: Colony Count: >=100000

## 2012-04-08 MED ORDER — ACETAMINOPHEN 325 MG PO TABS: 650.0000 mg | ORAL_TABLET | Freq: Four times a day (QID) | ORAL | Status: AC | PRN

## 2012-04-08 MED ORDER — SENNOSIDES-DOCUSATE SODIUM 8.6-50 MG PO TABS: 1.0000 | ORAL_TABLET | Freq: Two times a day (BID) | ORAL | Status: AC

## 2012-04-08 MED ORDER — NEBIVOLOL HCL 20 MG PO TABS: 30.0000 mg | ORAL_TABLET | Freq: Every morning | ORAL | Status: DC

## 2012-04-08 MED ORDER — NEBIVOLOL HCL 10 MG PO TABS: 30.0000 mg | ORAL_TABLET | Freq: Every morning | ORAL | Status: DC

## 2012-04-08 MED ORDER — ENSURE COMPLETE PO LIQD: 237.0000 mL | Freq: Three times a day (TID) | ORAL | Status: DC

## 2012-04-08 MED ORDER — LEVOFLOXACIN 750 MG PO TABS: 750.0000 mg | ORAL_TABLET | ORAL | Status: AC

## 2012-04-08 MED ORDER — LEVOFLOXACIN 750 MG PO TABS: 750.0000 mg | ORAL_TABLET | ORAL | Status: DC

## 2012-04-08 NOTE — Progress Notes (Signed)
GAVE REPORT TO IRENE AT [facility].

## 2012-04-08 NOTE — Discharge Summary (Signed)
Physician Discharge Summary  [name] LOV:[id number] DOB: October 25, 1931 DOA: 03/30/2012  PCP: [name], PA  Admit date: 03/30/2012 Discharge date: 04/08/2012  Recommendations for Outpatient Follow-up:  1. D/C from hospital with foley in place secondary to urinary retention.  Will need follow up with Urologist (with [facility]) on 04/15/12 at 9:30 AM for further evaluation and voiding trial.   2. Needs to follow up with Orthopaedic surgeon in 2 weeks as per their discussion with patient. 3. Will complete 9 days of antibiotic therapy for UTI.  Is to take levaquin orally every other day starting 9/5 and ending 9/7 4. Please follow up WBC count   Discharge Diagnoses:  Principal Problem:  *Femur fracture, left Active Problems:  Hypertension  Fall at home  History of shingles  Anemia  Acute blood loss anemia  Fever  Pneumonia   Discharge Condition: stable  Diet recommendation: Cardiac  Filed Weights   03/30/12 1741 03/30/12 2357  Weight: 68.04 kg (150 lb) 69.4 kg (153 lb)    History of present illness:  From original HPI: This is a 78 year old female who states she was at home today by her refrigerator, when her right leg gave out and she fell to her knee. She had immediate pain. She did not hit her head she did not loose consciousness. She denies any chest pain or any previous dizziness. The patient states she fell last week but she was on pain medications, however, she has not been on any pain medications today. Per patient and family she's had a recent bout of shingles and has been somewhat weak since. She does not treated fall.  The patient denies any cardiac history. She denies any chest pains, shortness of breath, history of congestive heart failure. She states she does all of her housework. She is normally to climb 2 flights of steps without any significant issues. History provided by patient and family at bedside.   Hospital Course:  *Femur fracture, left  S/p ORIF  on 8/27. Ortho following. As per recommendations, NWB left leg. Off knee immobilizer.  Daily dressing changes with 4x4 guaze and tape  Recommend on on Lovenox for 2 weeks, then ASA 367m bid for 4 weeks  -Oxycodone prn and Robaxin for muscle spasms  - MiraLax and Colace bid  -Iron 3273mtid  Follow up with [facility] 2 weeks   Active Problems:   Fever with leucocytosis  Blood cx negative. Urine culture grew out Proteus Mirabilis and E. Coli both sensitive to Levaquin and likely source of infection.  Patient to complete a 9 day course total with levaquin CXR suspicious for developing pneumonia.  Patient is asymptomatic with no productive cough, tachypnea, or shortness of breath.  Nonetheless will cover with levaquin to complete a 9 day course of antibiotics. started on levaquin (8/31--)  Still having temp spikes although less pronounced . Wbc improving in am labs. surgical site appears clean.  doppler LE for DVT negative   Hypertension  Cont home meds and will increase B blocker to 30 mg po daily  Acute blood loss anemia  H&H stable after PRBC. Cont iron supplements as outpatient  History of shingles/postherpetic neuralgia:  Healed rash of shingles across left chest in a bandlike pattern. Continue acyclovir    Urinary retention  Bladder scan with over 350 cc noted repeatedly  Foley resumed . Will have patient follow up with urologist.  Appointment set up please see above and d/c instructions for specifics.   Likely related to  recent infections (Shingles vs UTI)   Procedures: OPEN REDUCTION INTERNAL FIXATION (ORIF) PERIPROSTHETIC FRACTURE (Left)  Consultations:  Orthopedics: Dr. Justice Britain  Discharge Exam: Filed Vitals:   04/08/12 0800  BP:   Pulse:   Temp:   Resp: 17   Filed Vitals:   04/07/12 1759 04/07/12 2205 04/08/12 0539 04/08/12 0800  BP:  158/70 165/71   Pulse:  68 70   Temp: 98.7 F (37.1 C) 98.9 F (37.2 C) 99 F (37.2 C)   TempSrc: Oral  Oral Oral   Resp:  _0 Height:      Weight:      SpO2:  95% 93% 94%    General: Pt in NAD, Alert and Awake Cardiovascular: RRR, No MRG Respiratory: CTA BL  Discharge Instructions  Discharge Orders    Future Orders Please Complete By Expires   Diet - low sodium heart healthy      Diet - low sodium heart healthy      Call MD / Call 911      Comments:   If you experience chest pain or shortness of breath, CALL 911 and be transported to the hospital emergency room.  If you develope a fever above 101 F, pus (white drainage) or increased drainage or redness at the wound, or calf pain, call your surgeon's office.   Discharge instructions      Comments:   Daily dressing changes with 4x4 gauze and tape. Keep the area dry and clean until follow up. Follow up in 2 weeks at [facility]. Call with any questions or concerns.  Knee immobilizer as needed for comfort.   NWB left leg   Constipation Prevention      Comments:   Drink plenty of fluids.  Prune juice may be helpful.  You may use a stool softener, such as Colace (over the counter) 100 mg twice a day.  Use MiraLax (over the counter) for constipation as needed.   Driving restrictions      Comments:   No driving for 4 weeks   Change dressing      Comments:   Change the dressing daily with sterile 4 x 4 inch gauze dressing and tape. Keep the area dry and clean.   Increase activity slowly      Discharge instructions      Comments:   Please take medication as indicated and follow up with your orthopaedic surgeon surgeon in 2 weeks.  Follow up with primary care physician at the SNF in 1-2 weeks or sooner should any new concerns arise.  Also follow up with your urologist 04/15/12 at 9:30 AM for your recent urinary retention and voiding trial.  The urologist is Dr. Karsten Ro  ([facility])   Call MD for:  temperature >100.4      Call MD for:  redness, tenderness, or signs of infection (pain, swelling, redness, odor or  green/yellow discharge around incision site)        Medication List  As of 04/08/2012 11:11 AM   STOP taking these medications         ALPRAZolam 0.25 MG tablet      tiZANidine 4 MG tablet      traMADol-acetaminophen 37.5-325 MG per tablet         TAKE these medications         acetaminophen 325 MG tablet   Commonly known as: TYLENOL   Take 2 tablets (650 mg total) by mouth every 6 (six) hours as needed  for pain or fever.      aspirin EC 325 MG tablet   Take 1 tablet (325 mg total) by mouth 2 (two) times daily. X 4 weeks      doxazosin 8 MG tablet   Commonly known as: CARDURA   Take 8 mg by mouth at bedtime.      enoxaparin 40 MG/0.4ML injection   Commonly known as: LOVENOX   Inject 0.4 mLs (40 mg total) into the skin daily.      feeding supplement Liqd   Take 237 mLs by mouth 3 (three) times daily between meals.      fenofibrate micronized 134 MG capsule   Commonly known as: LOFIBRA   Take 134 mg by mouth daily.      ferrous sulfate 325 (65 FE) MG tablet   Take 1 tablet (325 mg total) by mouth 3 (three) times daily after meals.      levofloxacin 750 MG tablet   Commonly known as: LEVAQUIN   Take 1 tablet (750 mg total) by mouth every other day.   Start taking on: 04/09/2012      losartan-hydrochlorothiazide 100-25 MG per tablet   Commonly known as: HYZAAR   Take 1 tablet by mouth daily.      methocarbamol 500 MG tablet   Commonly known as: ROBAXIN   Take 1 tablet (500 mg total) by mouth every 6 (six) hours as needed (muscle spasms).      Nebivolol HCl 20 MG Tabs   Take 1.5 tablets (30 mg total) by mouth every morning.      omeprazole 20 MG capsule   Commonly known as: PRILOSEC   Take 20 mg by mouth every morning.      oxyCODONE 5 MG immediate release tablet   Commonly known as: Oxy IR/ROXICODONE   Take 1-3 tablets (5-15 mg total) by mouth every 4 (four) hours as needed for pain.      senna-docusate 8.6-50 MG per tablet   Commonly known as: Senokot-S    Take 1 tablet by mouth 2 (two) times daily.      temazepam 30 MG capsule   Commonly known as: RESTORIL   Take 30 mg by mouth at bedtime as needed. Sleep      valACYclovir 1000 MG tablet   Commonly known as: VALTREX   Take 1,000 mg by mouth every 8 (eight) hours. Take for 7 days           Follow-up Information    Follow up with Mauri Pole, MD. Schedule an appointment as soon as possible for a visit in 2 weeks.   Contact information:   [facility] [address], [place] [id number]           The results of significant diagnostics from this hospitalization (including imaging, microbiology, ancillary and laboratory) are listed below for reference.    Significant Diagnostic Studies: Dg Ribs Unilateral W/chest Left  03/30/2012  *RADIOLOGY REPORT*  Clinical Data: [place] today.  The patient reports no chest symptoms at this time.  LEFT RIBS AND CHEST - 3+ VIEW  Comparison: Chest dated 08/22/2009.  Findings: Normal sized heart.  Tortuous aorta.  Small hiatal hernia.  Clear lungs.  Mild diffuse peribronchial thickening and prominence of the interstitial markings.  Old, healed right rib fractures.  No acute rib fracture or pneumothorax seen.  Diffuse osteopenia.  IMPRESSION:  1.  No acute fracture. 2.  Small hiatal hernia. 3.  Mild chronic bronchitic  changes.   Original Report Authenticated By: Gerald Stabs, M.D.    Dg Hip Complete Left  03/30/2012  *RADIOLOGY REPORT*  Clinical Data: Left femur pain following a fall today.  LEFT HIP - COMPLETE 2+ VIEW  Comparison: None.  Findings: Diffuse osteopenia.  No fracture or dislocation seen.  IMPRESSION: No fracture or dislocation.   Original Report Authenticated By: Gerald Stabs, M.D.    Dg Femur Left  03/31/2012  *RADIOLOGY REPORT*  Clinical Data: Fracture fixation  DG C-ARM 61-120 MIN - NRPT [facility], LEFT FEMUR - 2 VIEW  Comparison: Plain films 03/30/2012.  Findings: We are provided  with six fluoroscopic spot views of the left femur.  Images demonstrate placement of a plate and screws and a cerclage wire for fixation of a distal femur fracture.  Position and alignment appear near anatomic.  Knee replacement noted.  IMPRESSION: ORIF distal left femur fracture.   Original Report Authenticated By: Arvid Right. D'ALESSIO, M.D.    Dg Knee 1-2 Views Left  03/30/2012  *RADIOLOGY REPORT*  Clinical Data: Left femur and knee pain following a fall today.  LEFT KNEE - 1-2 VIEW  Comparison: None.  Findings: Comminuted fracture of the distal shaft and metadiaphysis of the femur with medial and posterior angulation of the distal fragment as well as approximately one half shaft width of medial posterior displacement of the distal fragment.  There is also a medially displaced middle fragment.  A total knee prosthesis is also noted.  Diffuse osteopenia.  IMPRESSION: Comminuted distal femur fracture, as described above.   Original Report Authenticated By: Gerald Stabs, M.D.    Dg Chest Port 1 View  04/04/2012  *RADIOLOGY REPORT*  Clinical Data: Postop ORIF left femur fracture.  Follow up basilar atelectasis.  PORTABLE CHEST - 1 VIEW 04/04/2012 1111 hours:  Comparison: Portable chest x-ray yesterday and one-view chest x-ray 03/30/2012.  Findings: Cardiac silhouette mildly enlarged but stable.  Pulmonary vascularity normal without evidence of pulmonary edema.  Stable linear atelectasis in the lung bases.  Focal airspace opacity with air bronchograms in the medial left lung base.  Probable small effusions, unchanged.  IMPRESSION: Stable mild bibasilar atelectasis.  Developing pneumonia suspected in the medial left lung base.  Stable mild cardiomegaly without pulmonary edema.   Original Report Authenticated By: Deniece Portela, M.D.    Dg Chest Port 1 View  04/03/2012  *RADIOLOGY REPORT*  Clinical Data: Postoperative fever.  PORTABLE CHEST - 1 VIEW  Comparison: Plain film chest 03/30/2012.  Findings: Lung  volumes are low with basilar atelectasis.  There appear to be very small bilateral pleural effusions.  Heart size is normal.  No pneumothorax.  IMPRESSION: Mild bibasilar atelectasis in a low-volume chest.  Very small bilateral pleural effusions are identified.   Original Report Authenticated By: Arvid Right. D'ALESSIO, M.D.    Dg C-arm 61-120 Min-no Report  03/31/2012  *RADIOLOGY REPORT*  Clinical Data: Fracture fixation  DG C-ARM 61-120 MIN - NRPT [facility], LEFT FEMUR - 2 VIEW  Comparison: Plain films 03/30/2012.  Findings: We are provided with six fluoroscopic spot views of the left femur.  Images demonstrate placement of a plate and screws and a cerclage wire for fixation of a distal femur fracture.  Position and alignment appear near anatomic.  Knee replacement noted.  IMPRESSION: ORIF distal left femur fracture.   Original Report Authenticated By: Arvid Right. Luther Parody, M.D.     Microbiology: Recent Results (from the past 240 hour(s))  SURGICAL PCR SCREEN  Status: Abnormal   Collection Time   03/31/12  1:10 AM      Component Value Range Status Comment   MRSA, PCR POSITIVE (*) NEGATIVE Final    Staphylococcus aureus POSITIVE (*) NEGATIVE Final   CULTURE, BLOOD (ROUTINE X 2)     Status: Normal (Preliminary result)   Collection Time   04/03/12 12:25 AM      Component Value Range Status Comment   Specimen Description BLOOD RIGHT ANTECUBITAL   Final    Special Requests BOTTLES DRAWN AEROBIC AND ANAEROBIC 4 CC EA   Final    Culture  Setup Time 04/03/2012 04:40   Final    Culture     Final    Value:        BLOOD CULTURE RECEIVED NO GROWTH TO DATE CULTURE WILL BE HELD FOR 5 DAYS BEFORE ISSUING A FINAL NEGATIVE REPORT   Report Status PENDING   Incomplete   CULTURE, BLOOD (ROUTINE X 2)     Status: Normal (Preliminary result)   Collection Time   04/03/12 12:35 AM      Component Value Range Status Comment   Specimen Description BLOOD LEFT HAND   Final    Special Requests BOTTLES DRAWN AEROBIC ONLY 1 CC    Final    Culture  Setup Time 04/03/2012 04:40   Final    Culture     Final    Value:        BLOOD CULTURE RECEIVED NO GROWTH TO DATE CULTURE WILL BE HELD FOR 5 DAYS BEFORE ISSUING A FINAL NEGATIVE REPORT   Report Status PENDING   Incomplete   URINE CULTURE     Status: Normal   Collection Time   04/04/12  1:12 PM      Component Value Range Status Comment   Specimen Description URINE, CATHETERIZED   Final    Special Requests NONE   Final    Culture  Setup Time 04/04/2012 17:55   Final    Colony Count >=100,000 COLONIES/ML   Final    Culture     Final    Value: PROTEUS MIRABILIS     ESCHERICHIA COLI   Report Status 04/08/2012 FINAL   Final    Organism ID, Bacteria PROTEUS MIRABILIS   Final    Organism ID, Bacteria ESCHERICHIA COLI   Final   CULTURE, BLOOD (ROUTINE X 2)     Status: Normal (Preliminary result)   Collection Time   04/06/12  8:00 AM      Component Value Range Status Comment   Specimen Description BLOOD LEFT ARM   Final    Special Requests BOTTLES DRAWN AEROBIC AND ANAEROBIC 5CC EACH   Final    Culture  Setup Time 04/06/2012 15:24   Final    Culture     Final    Value:        BLOOD CULTURE RECEIVED NO GROWTH TO DATE CULTURE WILL BE HELD FOR 5 DAYS BEFORE ISSUING A FINAL NEGATIVE REPORT   Report Status PENDING   Incomplete   CULTURE, BLOOD (ROUTINE X 2)     Status: Normal (Preliminary result)   Collection Time   04/06/12  8:10 AM      Component Value Range Status Comment   Specimen Description BLOOD RIGHT HAND   Final    Special Requests BOTTLES DRAWN AEROBIC AND ANAEROBIC [facility] EACH   Final    Culture  Setup Time 04/06/2012 15:24   Final    Culture  Final    Value:        BLOOD CULTURE RECEIVED NO GROWTH TO DATE CULTURE WILL BE HELD FOR 5 DAYS BEFORE ISSUING A FINAL NEGATIVE REPORT   Report Status PENDING   Incomplete      Labs: Basic Metabolic Panel:  Lab 03/79/44 0445 04/03/12 0025 04/02/12 0405  NA 139 133* 133*  K 3.1* 4.0 3.4*  CL 102 102 100  CO2 _0 GLUCOSE 106* 126* 100*  BUN _1 CREATININE 0.88 0.75 1.06  CALCIUM 8.9 8.3* 8.1*  MG -- -- --  PHOS -- -- --   Liver Function Tests: No results found for this basename: AST:5,ALT:5,ALKPHOS:5,BILITOT:5,PROT:5,ALBUMIN:5 in the last 168 hours No results found for this basename: LIPASE:5,AMYLASE:5 in the last 168 hours No results found for this basename: AMMONIA:5 in the last 168 hours CBC:  Lab 04/08/12 0445 04/07/12 0456 04/05/12 0613 04/04/12 0928 04/03/12 0025  WBC 11.7* 11.7* 13.5* 13.4* 13.2*  NEUTROABS -- -- -- -- 10.1*  HGB 8.0* 8.0* 8.7* 8.8* 9.6*  HCT 23.8* 24.2* 25.8* 26.0* 28.1*  MCV 89.8 90.3 90.5 90.3 89.2  PLT 380 335 267 258 211   Cardiac Enzymes: No results found for this basename: CKTOTAL:5,CKMB:5,CKMBINDEX:5,TROPONINI:5 in the last 168 hours BNP: BNP (last 3 results) No results found for this basename: PROBNP:3 in the last 8760 hours CBG: No results found for this basename: GLUCAP:5 in the last 168 hours  Time coordinating discharge: > 30 minutes  Signed:  Velvet Bathe  [facility] 04/08/2012, 11:11 AM

## 2012-04-08 NOTE — Progress Notes (Signed)
Patient is set to discharge patient to [facility] today. Daughter, [name] made aware. [facility] called for 2:30 transport pickup.   Clinical Social Work Department CLINICAL SOCIAL WORK PLACEMENT NOTE 04/08/2012  Patient:  [name], [name]  Account Number:  [id number] Admit date:  03/30/2012  Clinical Social Worker:  Renold Genta  Date/time:  04/03/2012 10:42 AM  Clinical Social Work is seeking post-discharge placement for this patient at the following level of care:   SKILLED NURSING   (*CSW will update this form in Epic as items are completed)   04/03/2012  Patient/family provided with [place] Department of Clinical Social Work's list of facilities offering this level of care within the geographic area requested by the patient (or if unable, by the patient's family).  04/03/2012  Patient/family informed of their freedom to choose among providers that offer the needed level of care, that participate in Medicare, Medicaid or managed care program needed by the patient, have an available bed and are willing to accept the patient.  04/03/2012  Patient/family informed of [facility]' ownership interest in [facility], as well as of the fact that they are under no obligation to receive care at this facility.  PASARR submitted to EDS on 04/03/2012 PASARR number received from [employer] on 04/03/2012  FL2 transmitted to all facilities in geographic area requested by pt/family on  04/03/2012 FL2 transmitted to all facilities within larger geographic area on   Patient informed that his/her managed care company has contracts with or will negotiate with  certain facilities, including the following:     Patient/family informed of bed offers received:  04/03/2012 Patient chooses bed at [facility] [place] Physician recommends and patient chooses bed at    Patient to be transferred to [place] on  04/08/2012 Patient to be  transferred to facility by [facility]  The following physician request were entered in Epic:   Additional Comments:  Winfred Leeds, [place] Worker cell #: [phone]

## 2012-04-09 LAB — CULTURE, BLOOD (ROUTINE X 2)

## 2012-04-10 NOTE — Progress Notes (Signed)
Discharge summary sent to payer through [employer]  

## 2012-04-12 LAB — CULTURE, BLOOD (ROUTINE X 2): Culture: NO GROWTH

## 2013-11-25 ENCOUNTER — Other Ambulatory Visit: Payer: Self-pay | Admitting: Physician Assistant

## 2013-11-25 DIAGNOSIS — M545 Low back pain, unspecified: Secondary | ICD-10-CM

## 2013-11-25 DIAGNOSIS — M79604 Pain in right leg: Secondary | ICD-10-CM

## 2013-12-01 ENCOUNTER — Ambulatory Visit
Admission: RE | Admit: 2013-12-01 | Discharge: 2013-12-01 | Disposition: A | Discharge status: Home / Self Care | Payer: Medicare Other | Source: Ambulatory Visit | Attending: Physician Assistant | Admitting: Physician Assistant

## 2013-12-01 DIAGNOSIS — M545 Low back pain, unspecified: Secondary | ICD-10-CM

## 2013-12-01 DIAGNOSIS — M79604 Pain in right leg: Secondary | ICD-10-CM

## 2014-01-10 ENCOUNTER — Other Ambulatory Visit: Payer: Self-pay | Admitting: Neurosurgery

## 2014-01-10 DIAGNOSIS — M5126 Other intervertebral disc displacement, lumbar region: Secondary | ICD-10-CM

## 2014-01-12 ENCOUNTER — Ambulatory Visit
Admission: RE | Admit: 2014-01-12 | Discharge: 2014-01-12 | Disposition: A | Discharge status: Home / Self Care | Payer: Medicare Other | Source: Ambulatory Visit | Attending: Neurosurgery | Admitting: Neurosurgery

## 2014-01-12 DIAGNOSIS — M5126 Other intervertebral disc displacement, lumbar region: Secondary | ICD-10-CM

## 2014-01-12 MED ORDER — IOHEXOL 180 MG/ML  SOLN
1.0000 mL | Freq: Once | INTRAMUSCULAR | Status: AC | PRN
  Administered 2014-01-12: 1 mL via EPIDURAL

## 2014-01-12 MED ORDER — METHYLPREDNISOLONE ACETATE 40 MG/ML INJ SUSP (RADIOLOG
120.0000 mg | Freq: Once | INTRAMUSCULAR | Status: AC
  Administered 2014-01-12: 120 mg via EPIDURAL

## 2014-01-12 NOTE — Discharge Instructions (Signed)
Post Procedure Spinal Discharge Instruction Sheet  1. You may resume a regular diet and any medications that you routinely take (including pain medications).  2. No driving day of procedure.  3. Light activity throughout the rest of the day.  Do not do any strenuous work, exercise, bending or lifting.  The day following the procedure, you can resume normal physical activity but you should refrain from exercising or physical therapy for at least three days thereafter.   Common Side Effects:   Headaches- take your usual medications as directed by your physician.  Increase your fluid intake.  Caffeinated beverages may be helpful.  Lie flat in bed until your headache resolves.   Restlessness or inability to sleep- you may have trouble sleeping for the next few days.  Ask your referring physician if you need any medication for sleep.   Facial flushing or redness- should subside within a few days.   Increased pain- a temporary increase in pain a day or two following your procedure is not unusual.  Take your pain medication as prescribed by your referring physician.   Leg cramps  Please contact our office at [phone] for the following symptoms:  Fever greater than 100 degrees.  Headaches unresolved with medication after 2-3 days.  Increased swelling, pain, or redness at injection site.  Thank you for visiting our office.

## 2014-02-21 ENCOUNTER — Other Ambulatory Visit: Payer: Self-pay | Admitting: Neurosurgery

## 2014-02-21 DIAGNOSIS — M5126 Other intervertebral disc displacement, lumbar region: Secondary | ICD-10-CM

## 2014-02-24 ENCOUNTER — Ambulatory Visit
Admission: RE | Admit: 2014-02-24 | Discharge: 2014-02-24 | Disposition: A | Discharge status: Home / Self Care | Payer: Medicare Other | Source: Ambulatory Visit | Attending: Neurosurgery | Admitting: Neurosurgery

## 2014-02-24 DIAGNOSIS — M5126 Other intervertebral disc displacement, lumbar region: Secondary | ICD-10-CM

## 2014-02-24 MED ORDER — IOHEXOL 180 MG/ML  SOLN
1.0000 mL | Freq: Once | INTRAMUSCULAR | Status: AC | PRN
  Administered 2014-02-24: 1 mL via EPIDURAL

## 2014-02-24 MED ORDER — METHYLPREDNISOLONE ACETATE 40 MG/ML INJ SUSP (RADIOLOG
120.0000 mg | Freq: Once | INTRAMUSCULAR | Status: AC
  Administered 2014-02-24: 120 mg via EPIDURAL

## 2014-02-24 NOTE — Discharge Instructions (Signed)

## 2014-04-18 ENCOUNTER — Other Ambulatory Visit: Payer: Self-pay | Admitting: Neurosurgery

## 2014-04-18 DIAGNOSIS — M5126 Other intervertebral disc displacement, lumbar region: Secondary | ICD-10-CM

## 2014-04-28 ENCOUNTER — Ambulatory Visit
Admission: RE | Admit: 2014-04-28 | Discharge: 2014-04-28 | Disposition: A | Discharge status: Home / Self Care | Payer: Medicare Other | Source: Ambulatory Visit | Attending: Neurosurgery | Admitting: Neurosurgery

## 2014-04-28 DIAGNOSIS — M5126 Other intervertebral disc displacement, lumbar region: Secondary | ICD-10-CM

## 2014-04-28 MED ORDER — IOHEXOL 180 MG/ML  SOLN: 1.0000 mL | Freq: Once | INTRAMUSCULAR | Status: AC | PRN

## 2014-04-28 MED ORDER — METHYLPREDNISOLONE ACETATE 40 MG/ML INJ SUSP (RADIOLOG: 120.0000 mg | Freq: Once | INTRAMUSCULAR | Status: DC

## 2014-07-11 ENCOUNTER — Other Ambulatory Visit: Payer: Self-pay | Admitting: Neurosurgery

## 2014-08-01 NOTE — Pre-Procedure Instructions (Signed)
[name]  08/01/2014   Your procedure is scheduled on: Tuesday, Jan. 5th, 2016   Report to [facility] at  [facility].             (Arrival time is per your surgeon's request)   Call this number if you have problems the morning of surgery: [phone]   Remember:   Do not eat food or drink liquids after midnight Monday.    Take these medicines the morning of surgery with A SIP OF WATER: Carvedilol, Tramadol.   Do not wear jewelry, make-up or nail polish.  Do not wear lotions, powders, or perfumes. You may NOT wear deodorant the morning of surgery.  Do not shave underarms & legs 48 hours prior to surgery.    Do not bring valuables to the hospital.  [facility] is not responsible for any belongings or valuables.               Contacts, dentures or bridgework may not be worn into surgery.  Leave suitcase in the car. After surgery it may be brought to your room.  For patients admitted to the hospital, discharge time is determined by your treatment team.    Name and phone number of your driver:    Special Instructions: "Preparing for Surgery" instruction sheet.   Please read over the following fact sheets that you were given: Pain Booklet, Coughing and Deep Breathing and Surgical Site Infection Prevention

## 2014-08-02 ENCOUNTER — Encounter (HOSPITAL_COMMUNITY)
Admission: RE | Admit: 2014-08-02 | Discharge: 2014-08-02 | Disposition: A | Discharge status: Home / Self Care | Payer: Medicare Other | Source: Ambulatory Visit | Attending: Neurosurgery | Admitting: Neurosurgery

## 2014-08-02 ENCOUNTER — Encounter (HOSPITAL_COMMUNITY): Payer: Self-pay

## 2014-08-02 DIAGNOSIS — Z85828 Personal history of other malignant neoplasm of skin: Secondary | ICD-10-CM | POA: Diagnosis not present

## 2014-08-02 DIAGNOSIS — Z9049 Acquired absence of other specified parts of digestive tract: Secondary | ICD-10-CM | POA: Insufficient documentation

## 2014-08-02 DIAGNOSIS — I10 Essential (primary) hypertension: Secondary | ICD-10-CM | POA: Insufficient documentation

## 2014-08-02 DIAGNOSIS — Z01818 Encounter for other preprocedural examination: Secondary | ICD-10-CM | POA: Insufficient documentation

## 2014-08-02 DIAGNOSIS — K219 Gastro-esophageal reflux disease without esophagitis: Secondary | ICD-10-CM | POA: Insufficient documentation

## 2014-08-02 DIAGNOSIS — Z87891 Personal history of nicotine dependence: Secondary | ICD-10-CM | POA: Diagnosis not present

## 2014-08-02 DIAGNOSIS — K5660 Unspecified intestinal obstruction: Secondary | ICD-10-CM | POA: Insufficient documentation

## 2014-08-02 DIAGNOSIS — H3530 Unspecified macular degeneration: Secondary | ICD-10-CM | POA: Diagnosis not present

## 2014-08-02 DIAGNOSIS — Z96653 Presence of artificial knee joint, bilateral: Secondary | ICD-10-CM | POA: Insufficient documentation

## 2014-08-02 DIAGNOSIS — M1990 Unspecified osteoarthritis, unspecified site: Secondary | ICD-10-CM | POA: Diagnosis not present

## 2014-08-02 HISTORY — DX: Headache, unspecified: R51.9

## 2014-08-02 HISTORY — DX: Headache: R51

## 2014-08-02 HISTORY — DX: Unspecified macular degeneration: H35.30

## 2014-08-02 HISTORY — DX: Unspecified osteoarthritis, unspecified site: M19.90

## 2014-08-02 HISTORY — DX: Gastro-esophageal reflux disease without esophagitis: K21.9

## 2014-08-02 LAB — CBC
HCT: 34.7 % — ABNORMAL LOW (ref 36.0–46.0)
Hemoglobin: 11.4 g/dL — ABNORMAL LOW (ref 12.0–15.0)
MCH: 29.2 pg (ref 26.0–34.0)
MCHC: 32.9 g/dL (ref 30.0–36.0)
MCV: 88.7 fL (ref 78.0–100.0)
Platelets: 370 10*3/uL (ref 150–400)
RBC: 3.91 MIL/uL (ref 3.87–5.11)
RDW: 14.8 % (ref 11.5–15.5)
WBC: 10.3 10*3/uL (ref 4.0–10.5)

## 2014-08-02 LAB — COMPREHENSIVE METABOLIC PANEL
ALT: 9 U/L (ref 0–35)
AST: 16 U/L (ref 0–37)
Albumin: 3.4 g/dL — ABNORMAL LOW (ref 3.5–5.2)
Alkaline Phosphatase: 69 U/L (ref 39–117)
Anion gap: 9 (ref 5–15)
BUN: 25 mg/dL — ABNORMAL HIGH (ref 6–23)
CALCIUM: 9.8 mg/dL (ref 8.4–10.5)
CHLORIDE: 104 meq/L (ref 96–112)
CO2: 27 mmol/L (ref 19–32)
Creatinine, Ser: 1.07 mg/dL (ref 0.50–1.10)
GFR calc Af Amer: 55 mL/min — ABNORMAL LOW (ref >90)
GFR, EST NON AFRICAN AMERICAN: 48 mL/min — AB (ref >90)
Glucose, Bld: 140 mg/dL — ABNORMAL HIGH (ref 70–99)
Potassium: 3.1 mmol/L — ABNORMAL LOW (ref 3.5–5.1)
SODIUM: 140 mmol/L (ref 135–145)
Total Bilirubin: 0.3 mg/dL (ref 0.3–1.2)
Total Protein: 8 g/dL (ref 6.0–8.3)

## 2014-08-02 LAB — SURGICAL PCR SCREEN
MRSA, PCR: POSITIVE — AB
STAPHYLOCOCCUS AUREUS: POSITIVE — AB

## 2014-08-02 NOTE — [facility] Notes (Signed)
Mupirocin Ointment Rx called into [facility] in [place] for positive PCR of staph and MRSA. Pt notified and voiced understanding.

## 2014-08-02 NOTE — [facility] Notes (Addendum)
Denies any cardiac issues.  Sees no cardiologist. Had recent physical with EKG done at [facility]  [phone]--requested EKG Spoke with Janett Billow at the office and informed her of patient's abnormal labs, esp. K of 3.1, she will relay info to Dr. Joya Salm.  DA

## 2014-08-03 ENCOUNTER — Other Ambulatory Visit: Payer: Self-pay | Admitting: Neurosurgery

## 2014-08-03 NOTE — [facility] Notes (Signed)
Anesthesia Chart Review:  Patient is a 80 year old female scheduled for right L3-4 microdiscectomy on 08/09/14 by Dr. Joya Salm.  History includes former smoker, HTN, GERD, left eye macular degeneration, skin cancer (SCC), osteoarthritis, bilateral TKA, SBO s/p bowel resection complicated by post-operative respiratory distress requiring ventilator 2011, left femur fracture s/p ORIF '13. PCP is [name], PA-C.  Preoperative labs noted.   Patient reported a recent CPE with EKG done at [facility], but EKGs received were from 2010, 2005, 2001.  Last EKG here was from 2013, so she will need an EKG on the day of surgery.    Further evaluation and review of her EKG by her assigned anesthesiologist on the day of surgery.  George Hugh [facility] Short Stay [facility]/Anesthesiology Phone [phone] 08/03/2014 10:47 AM

## 2014-08-08 DIAGNOSIS — M545 Low back pain: Secondary | ICD-10-CM | POA: Diagnosis not present

## 2014-08-08 DIAGNOSIS — Z9181 History of falling: Secondary | ICD-10-CM | POA: Diagnosis not present

## 2014-08-08 DIAGNOSIS — E119 Type 2 diabetes mellitus without complications: Secondary | ICD-10-CM | POA: Diagnosis not present

## 2014-08-08 DIAGNOSIS — M4806 Spinal stenosis, lumbar region: Secondary | ICD-10-CM | POA: Diagnosis not present

## 2014-08-08 DIAGNOSIS — I10 Essential (primary) hypertension: Secondary | ICD-10-CM | POA: Diagnosis not present

## 2014-08-08 DIAGNOSIS — Z1389 Encounter for screening for other disorder: Secondary | ICD-10-CM | POA: Diagnosis not present

## 2014-08-08 DIAGNOSIS — E876 Hypokalemia: Secondary | ICD-10-CM | POA: Diagnosis not present

## 2014-08-08 MED ORDER — CEFAZOLIN SODIUM-DEXTROSE 2-3 GM-% IV SOLR
2.0000 g | INTRAVENOUS | Status: AC
  Administered 2014-08-09: 1 g via INTRAVENOUS
  Filled 2014-08-08: qty 50

## 2014-08-08 NOTE — H&P (Signed)
[name] is an 81 y.o. female.   Chief Complaint: right leg pain HPI: patient complaining of lumbar pain with radiation to the right foot with no improvement with conservative treatment.lumbar mri shows a herniated disc at l4-53fom the left to the right.  Past Medical History  Diagnosis Date  . Hypertension   . Shingles   . Back pain   . Intestinal obstruction   . Complication of anesthesia     resp distress -on vent after surgery  . GERD (gastroesophageal reflux disease)   . Headache     out grew them  . Arthritis     osteo  . Cancer     squamas cell on scalp--took 14 radiation tx--2013  . Macular degeneration of left eye     dx over 55 yrs ago.....hasn't changed much    Past Surgical History  Procedure Laterality Date  . Knee replacement surgery      bilateral  . Appendectomy    . Abdominal hysterectomy    . Small intestine surgery  2011    went into respiratory distress and was on ventilator after surgery  . Orif periprosthetic fracture  03/31/2012    Procedure: OPEN REDUCTION INTERNAL FIXATION (ORIF) PERIPROSTHETIC FRACTURE;  Surgeon: MMauri Pole MD;  Location: [facility] OR[facility];  Service: Orthopedics;  Laterality: Left;  Open reduction internal fixation Left distal femur periprosthetic fracture  . Eye surgery    . Cholecystectomy      No family history on file. Social History:  reports that she quit smoking about 23 years ago. She has never used smokeless tobacco. She reports that she does not drink alcohol or use illicit drugs.  Allergies:  Allergies  Allergen Reactions  . Sulfa Antibiotics Swelling    Mouth and tongue swelling    No prescriptions prior to admission    No results found for this or any previous visit (from the past 48 hour(s)). No results found.  Review of Systems  Constitutional: Negative.   HENT: Negative.   Eyes: Negative.   Respiratory: Negative.   Cardiovascular: Negative.   Gastrointestinal: Negative.   Genitourinary: Negative.    Musculoskeletal: Positive for back pain.  Skin: Negative.   Neurological: Negative.   Endo/Heme/Allergies: Negative.   Psychiatric/Behavioral: The patient is nervous/anxious.     There were no vitals taken for this visit. Physical Exam hent,nl. Neck, nl. Cv, nl. Lungs, clear. Abdomen soft . Extremities, nl No results found for this or any previous visit. Right foot DF weakness  of 3/5. hyperesthesia at l5 dermatome. SLR positive at 45 on the right .  Assessment/Plan Patient to go ahead with right l45 discectomy. She and her daughter are aware of risks and benefits  Yarenis Cerino M 08/08/2014, 5:36 PM

## 2014-08-09 ENCOUNTER — Ambulatory Visit (HOSPITAL_COMMUNITY): Payer: Medicare Other | Admitting: Anesthesiology

## 2014-08-09 ENCOUNTER — Encounter (HOSPITAL_COMMUNITY): Payer: Self-pay | Admitting: *Deleted

## 2014-08-09 ENCOUNTER — Inpatient Hospital Stay (HOSPITAL_COMMUNITY)
Admission: RE | Admit: 2014-08-09 | Discharge: 2014-08-10 | DRG: 520 | Disposition: A | Discharge status: Home / Self Care | Payer: Medicare Other | Source: Ambulatory Visit | Attending: Neurosurgery | Admitting: Neurosurgery

## 2014-08-09 ENCOUNTER — Encounter (HOSPITAL_COMMUNITY)
Admission: RE | Disposition: A | Discharge status: Home / Self Care | Payer: Self-pay | Source: Ambulatory Visit | Attending: Neurosurgery

## 2014-08-09 ENCOUNTER — Ambulatory Visit (HOSPITAL_COMMUNITY): Payer: Medicare Other | Admitting: Vascular Surgery

## 2014-08-09 DIAGNOSIS — K219 Gastro-esophageal reflux disease without esophagitis: Secondary | ICD-10-CM | POA: Diagnosis present

## 2014-08-09 DIAGNOSIS — Z9049 Acquired absence of other specified parts of digestive tract: Secondary | ICD-10-CM | POA: Diagnosis present

## 2014-08-09 DIAGNOSIS — M1990 Unspecified osteoarthritis, unspecified site: Secondary | ICD-10-CM | POA: Diagnosis not present

## 2014-08-09 DIAGNOSIS — M5116 Intervertebral disc disorders with radiculopathy, lumbar region: Secondary | ICD-10-CM | POA: Diagnosis not present

## 2014-08-09 DIAGNOSIS — H3530 Unspecified macular degeneration: Secondary | ICD-10-CM | POA: Diagnosis not present

## 2014-08-09 DIAGNOSIS — Z96653 Presence of artificial knee joint, bilateral: Secondary | ICD-10-CM | POA: Diagnosis present

## 2014-08-09 DIAGNOSIS — M549 Dorsalgia, unspecified: Secondary | ICD-10-CM | POA: Diagnosis not present

## 2014-08-09 DIAGNOSIS — M79604 Pain in right leg: Secondary | ICD-10-CM | POA: Diagnosis present

## 2014-08-09 DIAGNOSIS — Z90710 Acquired absence of both cervix and uterus: Secondary | ICD-10-CM

## 2014-08-09 DIAGNOSIS — M5126 Other intervertebral disc displacement, lumbar region: Secondary | ICD-10-CM | POA: Diagnosis not present

## 2014-08-09 DIAGNOSIS — Z882 Allergy status to sulfonamides status: Secondary | ICD-10-CM | POA: Diagnosis not present

## 2014-08-09 DIAGNOSIS — Z87891 Personal history of nicotine dependence: Secondary | ICD-10-CM

## 2014-08-09 DIAGNOSIS — I10 Essential (primary) hypertension: Secondary | ICD-10-CM | POA: Diagnosis not present

## 2014-08-09 HISTORY — PX: LUMBAR LAMINECTOMY/DECOMPRESSION MICRODISCECTOMY: SHX5026

## 2014-08-09 LAB — COMPREHENSIVE METABOLIC PANEL
ALT: 10 U/L (ref 0–35)
AST: 22 U/L (ref 0–37)
Albumin: 3.5 g/dL (ref 3.5–5.2)
Alkaline Phosphatase: 68 U/L (ref 39–117)
Anion gap: 11 (ref 5–15)
BILIRUBIN TOTAL: 0.2 mg/dL — AB (ref 0.3–1.2)
BUN: 23 mg/dL (ref 6–23)
CO2: 25 mmol/L (ref 19–32)
Calcium: 9.6 mg/dL (ref 8.4–10.5)
Chloride: 100 mEq/L (ref 96–112)
Creatinine, Ser: 1.16 mg/dL — ABNORMAL HIGH (ref 0.50–1.10)
GFR calc Af Amer: 50 mL/min — ABNORMAL LOW (ref >90)
GFR, EST NON AFRICAN AMERICAN: 43 mL/min — AB (ref >90)
GLUCOSE: 112 mg/dL — AB (ref 70–99)
POTASSIUM: 4.4 mmol/L (ref 3.5–5.1)
Sodium: 136 mmol/L (ref 135–145)
Total Protein: 7.7 g/dL (ref 6.0–8.3)

## 2014-08-09 SURGERY — LUMBAR LAMINECTOMY/DECOMPRESSION MICRODISCECTOMY 1 LEVEL
Anesthesia: General | Laterality: Right

## 2014-08-09 MED ORDER — PROMETHAZINE HCL 25 MG/ML IJ SOLN
6.2500 mg | INTRAMUSCULAR | Status: DC | PRN
Start: 2014-08-09 — End: 2014-08-09

## 2014-08-09 MED ORDER — ACETAMINOPHEN 650 MG RE SUPP: 650.0000 mg | RECTAL | Status: DC | PRN

## 2014-08-09 MED ORDER — GLYCOPYRROLATE 0.2 MG/ML IJ SOLN
INTRAMUSCULAR | Status: AC
  Filled 2014-08-09: qty 2

## 2014-08-09 MED ORDER — LIDOCAINE HCL (CARDIAC) 20 MG/ML IV SOLN
INTRAVENOUS | Status: AC
  Filled 2014-08-09: qty 5

## 2014-08-09 MED ORDER — PHENYLEPHRINE HCL 10 MG/ML IJ SOLN
INTRAMUSCULAR | Status: DC | PRN
  Administered 2014-08-09 (×3): 80 ug via INTRAVENOUS
  Administered 2014-08-09: 120 ug via INTRAVENOUS
  Administered 2014-08-09 (×4): 80 ug via INTRAVENOUS

## 2014-08-09 MED ORDER — CARVEDILOL 25 MG PO TABS
25.0000 mg | ORAL_TABLET | Freq: Two times a day (BID) | ORAL | Status: DC
  Administered 2014-08-09 – 2014-08-10 (×3): 25 mg via ORAL
  Filled 2014-08-09 (×2): qty 2
  Filled 2014-08-09 (×4): qty 1

## 2014-08-09 MED ORDER — NEBIVOLOL HCL 20 MG PO TABS: 30.0000 mg | ORAL_TABLET | Freq: Every morning | ORAL | Status: DC

## 2014-08-09 MED ORDER — SUCCINYLCHOLINE CHLORIDE 20 MG/ML IJ SOLN
INTRAMUSCULAR | Status: AC
  Filled 2014-08-09: qty 1

## 2014-08-09 MED ORDER — PROPOFOL 10 MG/ML IV BOLUS
INTRAVENOUS | Status: AC
  Filled 2014-08-09: qty 20

## 2014-08-09 MED ORDER — CEFAZOLIN SODIUM 1-5 GM-% IV SOLN
1.0000 g | Freq: Three times a day (TID) | INTRAVENOUS | Status: AC
  Administered 2014-08-09 – 2014-08-10 (×2): 1 g via INTRAVENOUS
  Filled 2014-08-09 (×2): qty 50

## 2014-08-09 MED ORDER — ONDANSETRON HCL 4 MG/2ML IJ SOLN: 4.0000 mg | INTRAMUSCULAR | Status: DC | PRN

## 2014-08-09 MED ORDER — ROCURONIUM BROMIDE 50 MG/5ML IV SOLN
INTRAVENOUS | Status: AC
  Filled 2014-08-09: qty 1

## 2014-08-09 MED ORDER — LACTATED RINGERS IV SOLN
INTRAVENOUS | Status: DC
  Administered 2014-08-09 (×3): via INTRAVENOUS

## 2014-08-09 MED ORDER — SUCCINYLCHOLINE CHLORIDE 20 MG/ML IJ SOLN
INTRAMUSCULAR | Status: DC | PRN
  Administered 2014-08-09: 70 mg via INTRAVENOUS

## 2014-08-09 MED ORDER — ONDANSETRON HCL 4 MG/2ML IJ SOLN: 4.0000 mg | Freq: Once | INTRAMUSCULAR | Status: DC | PRN

## 2014-08-09 MED ORDER — SODIUM CHLORIDE 0.9 % IJ SOLN: 3.0000 mL | INTRAMUSCULAR | Status: DC | PRN

## 2014-08-09 MED ORDER — METHYLPREDNISOLONE ACETATE 80 MG/ML IJ SUSP
INTRAMUSCULAR | Status: DC | PRN
  Administered 2014-08-09: 80 mg

## 2014-08-09 MED ORDER — FENTANYL CITRATE 0.05 MG/ML IJ SOLN
INTRAMUSCULAR | Status: AC
  Filled 2014-08-09: qty 5

## 2014-08-09 MED ORDER — FENTANYL CITRATE 0.05 MG/ML IJ SOLN
INTRAMUSCULAR | Status: DC | PRN
  Administered 2014-08-09: 100 ug via INTRAVENOUS

## 2014-08-09 MED ORDER — PHENYLEPHRINE 40 MCG/ML (10ML) SYRINGE FOR IV PUSH (FOR BLOOD PRESSURE SUPPORT)
PREFILLED_SYRINGE | INTRAVENOUS | Status: AC
  Filled 2014-08-09: qty 10

## 2014-08-09 MED ORDER — SODIUM CHLORIDE 0.9 % IV SOLN: 250.0000 mL | INTRAVENOUS | Status: DC

## 2014-08-09 MED ORDER — NEOSTIGMINE METHYLSULFATE 10 MG/10ML IV SOLN
INTRAVENOUS | Status: AC
  Filled 2014-08-09: qty 1

## 2014-08-09 MED ORDER — DIAZEPAM 5 MG PO TABS
5.0000 mg | ORAL_TABLET | Freq: Four times a day (QID) | ORAL | Status: DC | PRN
  Administered 2014-08-09: 5 mg via ORAL
  Filled 2014-08-09: qty 1

## 2014-08-09 MED ORDER — MENTHOL 3 MG MT LOZG: 1.0000 | LOZENGE | OROMUCOSAL | Status: DC | PRN

## 2014-08-09 MED ORDER — FENTANYL CITRATE 0.05 MG/ML IJ SOLN
INTRAMUSCULAR | Status: DC | PRN
  Administered 2014-08-09 (×3): 50 ug via INTRAVENOUS

## 2014-08-09 MED ORDER — ROCURONIUM BROMIDE 100 MG/10ML IV SOLN
INTRAVENOUS | Status: DC | PRN
  Administered 2014-08-09: 10 mg via INTRAVENOUS
  Administered 2014-08-09: 20 mg via INTRAVENOUS

## 2014-08-09 MED ORDER — DOXAZOSIN MESYLATE 8 MG PO TABS: 8.0000 mg | ORAL_TABLET | Freq: Every day | ORAL | Status: DC

## 2014-08-09 MED ORDER — NEOSTIGMINE METHYLSULFATE 10 MG/10ML IV SOLN
INTRAVENOUS | Status: DC | PRN
Start: 2014-08-09 — End: 2014-08-09
  Administered 2014-08-09: 3 mg via INTRAVENOUS

## 2014-08-09 MED ORDER — SODIUM CHLORIDE 0.9 % IV SOLN
INTRAVENOUS | Status: DC
  Administered 2014-08-09: 18:00:00 via INTRAVENOUS

## 2014-08-09 MED ORDER — SODIUM CHLORIDE 0.9 % IJ SOLN
INTRAMUSCULAR | Status: AC
  Filled 2014-08-09: qty 10

## 2014-08-09 MED ORDER — LOSARTAN POTASSIUM-HCTZ 100-25 MG PO TABS: 1.0000 | ORAL_TABLET | Freq: Every day | ORAL | Status: DC

## 2014-08-09 MED ORDER — HYDROMORPHONE HCL 1 MG/ML IJ SOLN: 0.2500 mg | INTRAMUSCULAR | Status: DC | PRN

## 2014-08-09 MED ORDER — LOSARTAN POTASSIUM 50 MG PO TABS
100.0000 mg | ORAL_TABLET | Freq: Every day | ORAL | Status: DC
  Administered 2014-08-10: 100 mg via ORAL
  Filled 2014-08-09: qty 2

## 2014-08-09 MED ORDER — FENOFIBRATE 54 MG PO TABS
54.0000 mg | ORAL_TABLET | Freq: Every day | ORAL | Status: DC
  Administered 2014-08-09 – 2014-08-10 (×2): 54 mg via ORAL
  Filled 2014-08-09 (×2): qty 1

## 2014-08-09 MED ORDER — MORPHINE SULFATE 2 MG/ML IJ SOLN
1.0000 mg | INTRAMUSCULAR | Status: DC | PRN
  Administered 2014-08-10 (×2): 2 mg via INTRAVENOUS
  Filled 2014-08-09 (×2): qty 1

## 2014-08-09 MED ORDER — SODIUM CHLORIDE 0.9 % IJ SOLN
3.0000 mL | Freq: Two times a day (BID) | INTRAMUSCULAR | Status: DC
  Administered 2014-08-09: 3 mL via INTRAVENOUS

## 2014-08-09 MED ORDER — HYDROCHLOROTHIAZIDE 25 MG PO TABS
25.0000 mg | ORAL_TABLET | Freq: Every day | ORAL | Status: DC
  Administered 2014-08-10: 25 mg via ORAL
  Filled 2014-08-09: qty 1

## 2014-08-09 MED ORDER — PROPOFOL 10 MG/ML IV BOLUS
INTRAVENOUS | Status: DC | PRN
  Administered 2014-08-09: 80 mg via INTRAVENOUS

## 2014-08-09 MED ORDER — THROMBIN 5000 UNITS EX SOLR
CUTANEOUS | Status: DC | PRN
  Administered 2014-08-09 (×2): 5000 [IU] via TOPICAL

## 2014-08-09 MED ORDER — FERROUS SULFATE 325 (65 FE) MG PO TABS
325.0000 mg | ORAL_TABLET | Freq: Three times a day (TID) | ORAL | Status: DC
Start: 2014-08-09 — End: 2014-08-10
  Administered 2014-08-09 – 2014-08-10 (×4): 325 mg via ORAL
  Filled 2014-08-09 (×4): qty 1

## 2014-08-09 MED ORDER — PHENOL 1.4 % MT LIQD: 1.0000 | OROMUCOSAL | Status: DC | PRN

## 2014-08-09 MED ORDER — HYDROMORPHONE HCL 1 MG/ML IJ SOLN
INTRAMUSCULAR | Status: AC
  Filled 2014-08-09: qty 1

## 2014-08-09 MED ORDER — FENTANYL CITRATE 0.05 MG/ML IJ SOLN
INTRAMUSCULAR | Status: AC
  Filled 2014-08-09: qty 2

## 2014-08-09 MED ORDER — 0.9 % SODIUM CHLORIDE (POUR BTL) OPTIME
TOPICAL | Status: DC | PRN
  Administered 2014-08-09: 1000 mL

## 2014-08-09 MED ORDER — GLYCOPYRROLATE 0.2 MG/ML IJ SOLN
INTRAMUSCULAR | Status: DC | PRN
  Administered 2014-08-09: 0.4 mg via INTRAVENOUS

## 2014-08-09 MED ORDER — HYDROMORPHONE HCL 1 MG/ML IJ SOLN
0.2500 mg | INTRAMUSCULAR | Status: DC | PRN
  Administered 2014-08-09 (×2): 0.5 mg via INTRAVENOUS

## 2014-08-09 MED ORDER — HEMOSTATIC AGENTS (NO CHARGE) OPTIME
TOPICAL | Status: DC | PRN
  Administered 2014-08-09: 1 via TOPICAL

## 2014-08-09 MED ORDER — ACETAMINOPHEN 325 MG PO TABS: 650.0000 mg | ORAL_TABLET | ORAL | Status: DC | PRN

## 2014-08-09 MED ORDER — BUPIVACAINE-EPINEPHRINE (PF) 0.5% -1:200000 IJ SOLN
INTRAMUSCULAR | Status: DC | PRN
  Administered 2014-08-09: 10 mL via PERINEURAL

## 2014-08-09 MED ORDER — LIDOCAINE HCL (CARDIAC) 20 MG/ML IV SOLN
INTRAVENOUS | Status: DC | PRN
  Administered 2014-08-09: 50 mg via INTRAVENOUS

## 2014-08-09 MED ORDER — ONDANSETRON HCL 4 MG/2ML IJ SOLN
INTRAMUSCULAR | Status: DC | PRN
  Administered 2014-08-09: 4 mg via INTRAVENOUS

## 2014-08-09 MED ORDER — OXYCODONE-ACETAMINOPHEN 5-325 MG PO TABS
1.0000 | ORAL_TABLET | ORAL | Status: DC | PRN
  Administered 2014-08-09 – 2014-08-10 (×5): 2 via ORAL
  Filled 2014-08-09 (×5): qty 2

## 2014-08-09 SURGICAL SUPPLY — 58 items
BENZOIN TINCTURE PRP APPL 2/3 (GAUZE/BANDAGES/DRESSINGS) ×3 IMPLANT
BLADE CLIPPER SURG (BLADE) IMPLANT
BUR ACORN 6.0 (BURR) ×2 IMPLANT
BUR ACORN 6.0MM (BURR) ×1
BUR MATCHSTICK NEURO 3.0 LAGG (BURR) ×3 IMPLANT
CANISTER SUCT 3000ML (MISCELLANEOUS) ×3 IMPLANT
CLOSURE WOUND 1/2 X4 (GAUZE/BANDAGES/DRESSINGS) ×1
CONT SPEC 4OZ CLIKSEAL STRL BL (MISCELLANEOUS) ×3 IMPLANT
DRAPE LAPAROTOMY 100X72X124 (DRAPES) ×3 IMPLANT
DRAPE MICROSCOPE LEICA (MISCELLANEOUS) ×3 IMPLANT
DRAPE POUCH INSTRU U-SHP 10X18 (DRAPES) ×3 IMPLANT
DRSG OPSITE POSTOP 4X6 (GAUZE/BANDAGES/DRESSINGS) ×3 IMPLANT
DRSG PAD ABDOMINAL 8X10 ST (GAUZE/BANDAGES/DRESSINGS) IMPLANT
DURAPREP 26ML APPLICATOR (WOUND CARE) ×3 IMPLANT
ELECT REM PT RETURN 9FT ADLT (ELECTROSURGICAL) ×3
ELECTRODE REM PT RTRN 9FT ADLT (ELECTROSURGICAL) ×1 IMPLANT
GAUZE SPONGE 4X4 12PLY STRL (GAUZE/BANDAGES/DRESSINGS) ×3 IMPLANT
GAUZE SPONGE 4X4 16PLY XRAY LF (GAUZE/BANDAGES/DRESSINGS) IMPLANT
GLOVE BIOGEL M 8.0 STRL (GLOVE) ×3 IMPLANT
GLOVE BIOGEL PI IND STRL 7.5 (GLOVE) ×1 IMPLANT
GLOVE BIOGEL PI IND STRL 8 (GLOVE) ×2 IMPLANT
GLOVE BIOGEL PI INDICATOR 7.5 (GLOVE) ×2
GLOVE BIOGEL PI INDICATOR 8 (GLOVE) ×4
GLOVE ECLIPSE 7.0 STRL STRAW (GLOVE) ×3 IMPLANT
GLOVE ECLIPSE 7.5 STRL STRAW (GLOVE) ×6 IMPLANT
GLOVE EXAM NITRILE LRG STRL (GLOVE) IMPLANT
GLOVE EXAM NITRILE MD LF STRL (GLOVE) IMPLANT
GLOVE EXAM NITRILE XL STR (GLOVE) IMPLANT
GLOVE EXAM NITRILE XS STR PU (GLOVE) IMPLANT
GOWN STRL REUS W/ TWL LRG LVL3 (GOWN DISPOSABLE) ×2 IMPLANT
GOWN STRL REUS W/ TWL XL LVL3 (GOWN DISPOSABLE) IMPLANT
GOWN STRL REUS W/TWL 2XL LVL3 (GOWN DISPOSABLE) IMPLANT
GOWN STRL REUS W/TWL LRG LVL3 (GOWN DISPOSABLE) ×4
GOWN STRL REUS W/TWL XL LVL3 (GOWN DISPOSABLE)
KIT BASIN OR (CUSTOM PROCEDURE TRAY) ×3 IMPLANT
KIT ROOM TURNOVER OR (KITS) ×3 IMPLANT
NEEDLE HYPO 18GX1.5 BLUNT FILL (NEEDLE) IMPLANT
NEEDLE HYPO 21X1.5 SAFETY (NEEDLE) IMPLANT
NEEDLE HYPO 25X1 1.5 SAFETY (NEEDLE) ×3 IMPLANT
NEEDLE SPNL 20GX3.5 QUINCKE YW (NEEDLE) ×3 IMPLANT
NS IRRIG 1000ML POUR BTL (IV SOLUTION) ×3 IMPLANT
PACK LAMINECTOMY NEURO (CUSTOM PROCEDURE TRAY) ×3 IMPLANT
PAD ARMBOARD 7.5X6 YLW CONV (MISCELLANEOUS) ×9 IMPLANT
PATTIES SURGICAL .5 X1 (DISPOSABLE) ×3 IMPLANT
RUBBERBAND STERILE (MISCELLANEOUS) ×6 IMPLANT
SPONGE LAP 4X18 X RAY DECT (DISPOSABLE) IMPLANT
SPONGE SURGIFOAM ABS GEL SZ50 (HEMOSTASIS) ×3 IMPLANT
STRIP CLOSURE SKIN 1/2X4 (GAUZE/BANDAGES/DRESSINGS) ×2 IMPLANT
SUT VIC AB 0 CT1 18XCR BRD8 (SUTURE) ×1 IMPLANT
SUT VIC AB 0 CT1 8-18 (SUTURE) ×2
SUT VIC AB 2-0 CP2 18 (SUTURE) ×3 IMPLANT
SUT VIC AB 3-0 SH 8-18 (SUTURE) ×3 IMPLANT
SYR 20CC LL (SYRINGE) IMPLANT
SYR 20ML ECCENTRIC (SYRINGE) ×3 IMPLANT
SYR 5ML LL (SYRINGE) IMPLANT
TOWEL OR 17X24 6PK STRL BLUE (TOWEL DISPOSABLE) ×3 IMPLANT
TOWEL OR 17X26 10 PK STRL BLUE (TOWEL DISPOSABLE) ×3 IMPLANT
WATER STERILE IRR 1000ML POUR (IV SOLUTION) ×3 IMPLANT

## 2014-08-09 NOTE — Anesthesia Preprocedure Evaluation (Signed)
Anesthesia Evaluation  Patient identified by MRN, date of birth, ID band Patient awake    Reviewed: Allergy & Precautions, NPO status , Patient's Chart, lab work & pertinent test results  History of Anesthesia Complications (+) history of anesthetic complications  Airway        Dental   Pulmonary former smoker,          Cardiovascular hypertension,     Neuro/Psych  Headaches,    GI/Hepatic GERD-  ,  Endo/Other    Renal/GU      Musculoskeletal  (+) Arthritis -,   Abdominal   Peds  Hematology  (+) anemia ,   Anesthesia Other Findings   Reproductive/Obstetrics                             Anesthesia Physical Anesthesia Plan  ASA: II  Anesthesia Plan: General   Post-op Pain Management:    Induction: Intravenous  Airway Management Planned: Oral ETT  Additional Equipment:   Intra-op Plan:   Post-operative Plan: Possible Post-op intubation/ventilation  Informed Consent: I have reviewed the patients History and Physical, chart, labs and discussed the procedure including the risks, benefits and alternatives for the proposed anesthesia with the patient or authorized representative who has indicated his/her understanding and acceptance.     Plan Discussed with: CRNA, Anesthesiologist and Surgeon  Anesthesia Plan Comments:         Anesthesia Quick Evaluation

## 2014-08-09 NOTE — Anesthesia Postprocedure Evaluation (Signed)
Anesthesia Post Note  Patient: [name]  Procedure(s) Performed: Procedure(s) (LRB): Right Lumbar Four to Five Microdiskectomy (Right)  Anesthesia type: general  Patient location: PACU  Post pain: Pain level controlled  Post assessment: Patient's Cardiovascular Status Stable  Last Vitals:  Filed Vitals:   08/09/14 1836  BP: 139/48  Pulse: 63  Temp: 36.4 C  Resp: 14    Post vital signs: Reviewed and stable  Level of consciousness: sedated  Complications: No apparent anesthesia complications

## 2014-08-09 NOTE — [facility] Notes (Signed)
Pt is admitted to room 4N07 from PACU. Admission vital signs are stable

## 2014-08-09 NOTE — Transfer of Care (Signed)
Immediate Anesthesia Transfer of Care Note  Patient: [name]  Procedure(s) Performed: Procedure(s) with comments: Right Lumbar Four to Five Microdiskectomy (Right) - Right L4-5 Microdiskectomy  Patient Location: PACU  Anesthesia Type:General  Level of Consciousness: awake and alert   Airway & Oxygen Therapy: Patient Spontanous Breathing and Patient connected to nasal cannula oxygen  Post-op Assessment: Report given to PACU RN and Post -op Vital signs reviewed and stable  Post vital signs: Reviewed and stable  Complications: No apparent anesthesia complications

## 2014-08-10 MED ORDER — CHLORHEXIDINE GLUCONATE CLOTH 2 % EX PADS: 6.0000 | MEDICATED_PAD | Freq: Every day | CUTANEOUS | Status: DC

## 2014-08-10 MED ORDER — MUPIROCIN 2 % EX OINT
1.0000 "application " | TOPICAL_OINTMENT | Freq: Two times a day (BID) | CUTANEOUS | Status: DC
  Filled 2014-08-10 (×2): qty 22

## 2014-08-10 NOTE — [facility] Notes (Signed)
Patient was discharged home with daughter. Instructions and medications reviewed with patient. Both state they understand instructions.

## 2014-08-10 NOTE — Op Note (Addendum)
[name], [name]                ACCOUNT NO.:  [id number]  MEDICAL RECORD NO.:  [id number]  LOCATION:                                 FACILITY:  PHYSICIAN:  Leeroy Cha, M.D.   DATE OF BIRTH:  09-11-1931  DATE OF PROCEDURE:  08/09/2014 DATE OF DISCHARGE:                              OPERATIVE REPORT   PREOPERATIVE DIAGNOSIS:  Right L4-L5 herniated disk with chronic and acute radiculopathy.  POSTOPERATIVE DIAGNOSIS:  Right L4-L5 herniated disk with chronic and acute radiculopathy.  PROCEDURE PERFORMED:  Right L4-L5 diskectomy with decompression of the L5 nerve root, lysis of adhesions, foraminotomy.  Microscope.  SURGEON:  Leeroy Cha, M.D.  ASSISTANT:  Rosana Fret.  CLINICAL HISTORY:  The patient was seen in my office complaining of back pain _with radiation_________ to the right leg associated with weakness.  MRI showed degenerative disk disease, but at the level 4-5, she has a herniated disk.  Surgery was advised and she and her daughter, 2 of them knew the risks and benefits.  DESCRIPTION OF PROCEDURE:  The patient was taken to the OR and after intubation, she was positioned in a prone manner.  The back was cleaned with Betadine and DuraPrep.  Drapes were applied.  We identified the L4- L5 space.  An incision was made between L4-L5 retracting muscle to the right side.  Once the muscle was retracted, we were able to feel the lower space and from then on, we went space up.  With the help of the microscope, we did a foraminotomy at L4 and L5 with removal of thick calcified yellow ligament.  Retraction of the thecal sac was done, and we found quite a bit of fibrosis.  This patient never had surgery in the spine __________.  Lysis was accomplished.  The nerve was completely adherent to the floor.  Lysis was done and retraction of the thecal sac was made.  We entered the disk space with a large amount of degenerative disk disease.  Medial and lateral were removed.   The patient had osteophyte and it also was drilled out.  At the end, we had a good decompression and good mobilization of the L5 nerve root as well as the thecal sac.  Valsalva maneuver twice was negative.  From then on, Depo-Medrol and fentanyl were left in the epidural space and the skin was closed with Vicryl and Steri-Strips.          ______________________________ Leeroy Cha, M.D.     EB/MEDQ  D:  08/09/2014  T:  08/10/2014  Job:  [id number]

## 2014-08-10 NOTE — Discharge Summary (Signed)
Physician Discharge Summary  Patient ID: [name] MRN: [id number] DOB/AGE: 79/04/1932 79 y.o.  Admit date: 08/09/2014 Discharge date: 08/10/2014  Admission Diagnoses:lumbar herniated disc  Discharge Diagnoses:  Active Problems:   Lumbar herniated disc   Discharged Condition: no pain  Hospital Course: surgery  Consults: none  Significant Diagnostic Studies: mri  Treatments: lumbar discectomy  Discharge Exam: Blood pressure 127/91, pulse 99, temperature 98.3 F (36.8 C), temperature source Oral, resp. rate 18, height _0  (1.575 m), weight 67.132 kg (148 lb), SpO2 93 %. No weakness  Disposition: home in th next 4 to 6 hours     Medication List    ASK your doctor about these medications        carvedilol 25 MG tablet  Commonly known as:  COREG  Take 25 mg by mouth 2 (two) times daily with a meal.     doxazosin 8 MG tablet  Commonly known as:  CARDURA  Take 8 mg by mouth at bedtime.     feeding supplement (ENSURE COMPLETE) Liqd  Take 237 mLs by mouth 3 (three) times daily between meals.     fenofibrate micronized 134 MG capsule  Commonly known as:  LOFIBRA  Take 134 mg by mouth daily.     ferrous sulfate 325 (65 FE) MG tablet  Take 1 tablet (325 mg total) by mouth 3 (three) times daily after meals.     HYDROcodone-acetaminophen 5-325 MG per tablet  Commonly known as:  NORCO/VICODIN  Take 1 tablet by mouth every 4 (four) hours as needed for moderate pain.     losartan-hydrochlorothiazide 100-25 MG per tablet  Commonly known as:  HYZAAR  Take 1 tablet by mouth daily.     temazepam 30 MG capsule  Commonly known as:  RESTORIL  Take 30 mg by mouth at bedtime as needed. Sleep     traMADol 50 MG tablet  Commonly known as:  ULTRAM  Take 50 mg by mouth every 6 (six) hours as needed (pain).     valACYclovir 1000 MG tablet  Commonly known as:  VALTREX  Take 1,000 mg by mouth every 8 (eight) hours. Take for 7 days         Signed: Floyce Stakes 08/10/2014, 12:44 PM

## 2014-08-10 NOTE — Evaluation (Signed)
Physical Therapy Evaluation Patient Details Name: [name] MRN: [id number] DOB: January 17, 1932 Today's Date: 08/10/2014   History of Present Illness  Pt presents for microdiskectomy L4-5. PMH: OA, shingles, HTN, macular degeneration, left femur fx several years ago  Clinical Impression  Pt admitted with above diagnosis. Pt currently with functional limitations due to the deficits listed below (see PT Problem List).  Pt will benefit from skilled PT to increase their independence and safety with mobility to allow discharge to the venue listed below.       Follow Up Recommendations Home health PT    Equipment Recommendations  None recommended by PT    Recommendations for Other Services       Precautions / Restrictions Precautions Precautions: Back Precaution Booklet Issued: Yes (comment) Restrictions Weight Bearing Restrictions: No      Mobility  Bed Mobility Overal bed mobility: Needs Assistance Bed Mobility: Rolling;Supine to Sit Rolling: Supervision   Supine to sit: Supervision     General bed mobility comments: supervision given for safety, pt kept precautions and needed no physical assist to get EOB  Transfers Overall transfer level: Needs assistance Equipment used: Rolling walker (2 wheeled) Transfers: Sit to/from Stand Sit to Stand: Supervision         General transfer comment: supervision only from bed and toilet  Ambulation/Gait Ambulation/Gait assistance: Supervision Ambulation Distance (Feet): 70 Feet Assistive device: Rolling walker (2 wheeled) Gait Pattern/deviations: Step-through pattern;Trunk flexed Gait velocity: decreased, guarded   General Gait Details: guarded gait, vc's for upright posture  Stairs            Wheelchair Mobility    Modified Rankin (Stroke Patients Only)       Balance Overall balance assessment: No apparent balance deficits (not formally assessed)                                            Pertinent Vitals/Pain Pain Assessment: 0-10 Pain Score: 4  Pain Location: back Pain Intervention(s): Monitored during session    Home Living Family/patient expects to be discharged to:: Private residence Living Arrangements: Alone Available Help at Discharge: Family;Available PRN/intermittently Type of Home: House Home Access: Stairs to enter   Entrance Stairs-Number of Steps: 3 Home Layout: One level Home Equipment: Walker - 2 wheels;Cane - quad Additional Comments: pt's daughter lives right behind her and can stay with her if needed.    Prior Function Level of Independence: Independent         Comments: has cane but did not use AD PTA     Hand Dominance        Extremity/Trunk Assessment   Upper Extremity Assessment: Overall WFL for tasks assessed           Lower Extremity Assessment: LLE deficits/detail   LLE Deficits / Details: left hip and quad weakness since femur fx  Cervical / Trunk Assessment: Kyphotic  Communication   Communication: No difficulties  Cognition Arousal/Alertness: Awake/alert Behavior During Therapy: WFL for tasks assessed/performed Overall Cognitive Status: Within Functional Limits for tasks assessed                      General Comments      Exercises        Assessment/Plan    PT Assessment Patient needs continued PT services  PT Diagnosis Acute pain   PT Problem List Decreased mobility;Decreased knowledge  of use of DME;Decreased knowledge of precautions;Pain  PT Treatment Interventions DME instruction;Gait training;Stair training;Functional mobility training;Therapeutic activities;Therapeutic exercise;Patient/family education   PT Goals (Current goals can be found in the Care Plan section) Acute Rehab PT Goals Patient Stated Goal: return home PT Goal Formulation: With patient Time For Goal Achievement: 08/17/14 Potential to Achieve Goals: Good    Frequency Min 5X/week   Barriers to discharge         Co-evaluation               End of Session Equipment Utilized During Treatment: Gait belt Activity Tolerance: Patient tolerated treatment well Patient left: in chair;with call bell/phone within reach;with family/visitor present Nurse Communication: Mobility status         Time: 1829-9371 PT Time Calculation (min) (ACUTE ONLY): 27 min   Charges:   PT Evaluation $Initial PT Evaluation Tier I: 1 Procedure PT Treatments $Gait Training: 8-22 mins $Therapeutic Activity: 8-22 mins   PT G Codes:       Leighton Roach, PT  Acute Rehab Services  [place], [place] 08/10/2014, 1:11 PM

## 2014-08-10 NOTE — [facility] Notes (Signed)
CARE MANAGEMENT NOTE 08/10/2014  Patient:  [name], [name]   Account Number:  [id number]  Date Initiated:  08/10/2014  Documentation initiated by:  Olga Coaster  Subjective/Objective Assessment:   ADMITTED FOR SURGERY     Action/Plan:   CM FOLLOWING FOR DCP   Anticipated DC Date:  08/15/2014   Anticipated DC Plan:  AWAITING FOR PT/OT EVALS FOR DISPOSITION NEEDS     DC Planning Services  CM consult         Status of service:  In process, will continue to follow Medicare Important Message given?   (If response is "NO", the following Medicare IM given date fields will be blank)  Per UR Regulation:  Reviewed for med. necessity/level of care/duration of stay  Comments:  1/6/2016Mindi Slicker RN,BSN,MHA [phone]

## 2014-08-10 NOTE — Evaluation (Signed)
Occupational Therapy Evaluation Patient Details Name: [name] MRN: [id number] DOB: Dec 11, 1931 Today's Date: 08/10/2014    History of Present Illness Pt presents for microdiskectomy L4-5. PMH: OA, shingles, HTN, macular degeneration, left femur fx several years ago   Clinical Impression   Patient evaluated by Occupational Therapy with no further acute OT needs identified. All education has been completed and the patient has no further questions. See below for any follow-up Occupational Therapy or equipment needs. OT to sign off. Thank you for referral.  All education is complete and patient indicates understanding.     Follow Up Recommendations  No OT follow up    Equipment Recommendations  None recommended by OT    Recommendations for Other Services       Precautions / Restrictions Precautions Precautions: Back Precaution Booklet Issued: Yes (comment) Restrictions Weight Bearing Restrictions: No      Mobility Bed Mobility Overal bed mobility: Needs Assistance Bed Mobility: Rolling;Supine to Sit Rolling: Supervision   Supine to sit: Supervision     General bed mobility comments: supervision given for safety, pt kept precautions and needed no physical assist to get EOB  Transfers Overall transfer level: Needs assistance Equipment used: Rolling walker (2 wheeled) Transfers: Sit to/from Stand Sit to Stand: Supervision         General transfer comment: supervision only from bed and toilet    Balance Overall balance assessment: No apparent balance deficits (not formally assessed)                                          ADL Overall ADL's : At baseline                                       General ADL Comments: educated on back precautions with adls and daughter present taking notes. Pt educated on medication management at night with alarm, environmental setup for R hand dominance, AE (reacher) for LB dressing L LE first,  environmental kitchen setup for precautions and toilet hygiene. Daughter educated on car setup for transfer and to allow easier enter/ exit. all education complete     Vision                     Perception     Praxis      Pertinent Vitals/Pain Pain Assessment: 0-10 Pain Score: 4  Pain Location: back Pain Intervention(s): Monitored during session     Hand Dominance Right   Extremity/Trunk Assessment Upper Extremity Assessment Upper Extremity Assessment: Overall WFL for tasks assessed   Lower Extremity Assessment Lower Extremity Assessment: Defer to PT evaluation LLE Deficits / Details: left hip and quad weakness since femur fx   Cervical / Trunk Assessment Cervical / Trunk Assessment: Kyphotic   Communication Communication Communication: No difficulties   Cognition Arousal/Alertness: Awake/alert Behavior During Therapy: WFL for tasks assessed/performed Overall Cognitive Status: Within Functional Limits for tasks assessed                     General Comments       Exercises       Shoulder Instructions      Home Living Family/patient expects to be discharged to:: Private residence Living Arrangements: Alone Available Help at Discharge: Family;Available PRN/intermittently Type of Home: [place]  Access: Stairs to enter [employer] of Steps: 3   Home Layout: One level     Bathroom Shower/Tub: Occupational psychologist: Standard     Home Equipment: Environmental consultant - 2 wheels;Cane - quad   Additional Comments: pt's daughter lives right behind her and can stay with her if needed.      Prior Functioning/Environment Level of Independence: Independent        Comments: has cane but did not use AD PTA    OT Diagnosis:     OT Problem List:     OT Treatment/Interventions:      OT Goals(Current goals can be found in the care plan section) Acute Rehab OT Goals Patient Stated Goal: return home  OT Frequency:     Barriers to  D/C:            Co-evaluation              End of Session Nurse Communication: Mobility status  Activity Tolerance: Patient tolerated treatment well Patient left: in bed;with call bell/phone within reach;with bed alarm set;with family/visitor present   Time: 1351-1418 OT Time Calculation (min): 27 min Charges:  OT General Charges $OT Visit: 1 Procedure OT Evaluation $Initial OT Evaluation Tier I: 1 Procedure OT Treatments $Self Care/Home Management : 8-22 mins G-Codes:    Peri Maris 09-05-14, 2:19 PM Pager: [phone]

## 2014-08-10 NOTE — Discharge Instructions (Signed)
Spinal Fusion Care After Refer to this sheet in the next few weeks. These instructions provide you with information on caring for yourself after your procedure. Your caregiver may also give you more specific instructions. Your treatment has been planned according to current medical practices, but problems sometimes occur. Call your caregiver if you have any problems or questions after your procedure. HOME CARE INSTRUCTIONS   Take whatever pain medicine has been prescribed by your caregiver. Do not take over-the-counter pain medicine unless directed otherwise by your caregiver.  Do not drive if you are taking narcotic pain medicines.  Change your bandage (dressing) if necessary or as directed by your caregiver.  Do not get your surgical cut (incision) wet. After a few days you may take quick showers (rather than baths), but keep your incision clean and dry. Covering the incision with plastic wrap while you shower should keep your incision dry. A few weeks after surgery, once your incision has healed and your caregiver says it is okay, you can take baths or go swimming.  If you have been prescribed medicine to prevent your blood from clotting, follow the directions carefully.  Check the area around your incision often. Look for redness and swelling. Also, look for anything leaking from your wound. You can use a mirror or have a family member inspect your incision if it is in a place where it is difficult for you to see.  Ask your caregiver what activities you should avoid and for how long.  Walk as much as possible.  Do not lift anything heavier than 10 pounds (4.5 kilograms) until your caregiver says it is safe.  Do not twist or bend for a few weeks. Try not to pull on things. Avoid sitting for long periods of time. Change positions at least every hour.  Ask your caregiver what kinds of exercise you should do to make your back stronger and when you should begin doing these exercises. SEEK  IMMEDIATE MEDICAL CARE IF:   Pain suddenly becomes much worse.  The incision area is red, swollen, bleeding, or leaking fluid.  Your legs or feet become increasingly painful, numb, weak, or swollen.  You have trouble controlling urination or bowel movements.  You have trouble breathing.  You have chest pain.  You have a fever. MAKE SURE YOU:  Understand these instructions.  Will watch your condition.  Will get help right away if you are not doing well or get worse. Document Released: 02/08/2005 Document Revised: 10/14/2011 Document Reviewed: 10/04/2010 [facility] Patient Information 2015 [place], [place]. This information is not intended to replace advice given to you by your health care provider. Make sure you discuss any questions you have with your health care provider.

## 2014-08-11 ENCOUNTER — Encounter (HOSPITAL_COMMUNITY): Payer: Self-pay | Admitting: Neurosurgery

## 2014-09-15 DIAGNOSIS — E119 Type 2 diabetes mellitus without complications: Secondary | ICD-10-CM | POA: Diagnosis not present

## 2014-09-15 DIAGNOSIS — I10 Essential (primary) hypertension: Secondary | ICD-10-CM | POA: Diagnosis not present

## 2014-09-15 DIAGNOSIS — M545 Low back pain: Secondary | ICD-10-CM | POA: Diagnosis not present

## 2014-09-15 DIAGNOSIS — E780 Pure hypercholesterolemia: Secondary | ICD-10-CM | POA: Diagnosis not present

## 2014-09-15 DIAGNOSIS — F418 Other specified anxiety disorders: Secondary | ICD-10-CM | POA: Diagnosis not present

## 2014-10-15 ENCOUNTER — Emergency Department (HOSPITAL_COMMUNITY)
Admission: EM | Admit: 2014-10-15 | Discharge: 2014-10-15 | Disposition: A | Discharge status: Home / Self Care | Payer: Medicare Other | Attending: Emergency Medicine | Admitting: Emergency Medicine

## 2014-10-15 ENCOUNTER — Emergency Department (HOSPITAL_COMMUNITY): Payer: Medicare Other

## 2014-10-15 ENCOUNTER — Encounter (HOSPITAL_COMMUNITY): Payer: Self-pay | Admitting: Emergency Medicine

## 2014-10-15 DIAGNOSIS — M1990 Unspecified osteoarthritis, unspecified site: Secondary | ICD-10-CM | POA: Diagnosis not present

## 2014-10-15 DIAGNOSIS — Z79899 Other long term (current) drug therapy: Secondary | ICD-10-CM | POA: Diagnosis not present

## 2014-10-15 DIAGNOSIS — Z8719 Personal history of other diseases of the digestive system: Secondary | ICD-10-CM | POA: Diagnosis not present

## 2014-10-15 DIAGNOSIS — Z85828 Personal history of other malignant neoplasm of skin: Secondary | ICD-10-CM | POA: Diagnosis not present

## 2014-10-15 DIAGNOSIS — R52 Pain, unspecified: Secondary | ICD-10-CM

## 2014-10-15 DIAGNOSIS — Z8619 Personal history of other infectious and parasitic diseases: Secondary | ICD-10-CM | POA: Insufficient documentation

## 2014-10-15 DIAGNOSIS — I10 Essential (primary) hypertension: Secondary | ICD-10-CM | POA: Insufficient documentation

## 2014-10-15 DIAGNOSIS — Z8669 Personal history of other diseases of the nervous system and sense organs: Secondary | ICD-10-CM | POA: Insufficient documentation

## 2014-10-15 DIAGNOSIS — Z9889 Other specified postprocedural states: Secondary | ICD-10-CM | POA: Insufficient documentation

## 2014-10-15 DIAGNOSIS — M545 Low back pain: Secondary | ICD-10-CM | POA: Diagnosis not present

## 2014-10-15 DIAGNOSIS — Z87891 Personal history of nicotine dependence: Secondary | ICD-10-CM | POA: Insufficient documentation

## 2014-10-15 DIAGNOSIS — M5441 Lumbago with sciatica, right side: Secondary | ICD-10-CM | POA: Insufficient documentation

## 2014-10-15 DIAGNOSIS — S3992XA Unspecified injury of lower back, initial encounter: Secondary | ICD-10-CM | POA: Diagnosis not present

## 2014-10-15 MED ORDER — HYDROCODONE-ACETAMINOPHEN 5-325 MG PO TABS: 1.0000 | ORAL_TABLET | Freq: Four times a day (QID) | ORAL | Status: DC | PRN

## 2014-10-15 MED ORDER — HYDROMORPHONE HCL 1 MG/ML IJ SOLN
1.0000 mg | Freq: Once | INTRAMUSCULAR | Status: AC
  Administered 2014-10-15: 1 mg via INTRAMUSCULAR
  Filled 2014-10-15: qty 1

## 2014-10-15 NOTE — Discharge Instructions (Signed)
It was our pleasure to provide your ER care today - we hope that you feel better.  Avoid bending at waist, or heavy lifting > 10 lbs for the next few days.   Take motrin or aleve as need for pain.   You may also take hydrocodone as need for pain. No driving when taking hydrocodone. Also, do not take tylenol or acetaminophen containing medication when taking hydrocodone.  Follow up with your back specialist this week - call office Monday to arrange appointment.  Return to ER if worse, new symptoms, leg numbness/weakness, fevers, intractable pain, other concern.  You were given pain medication in the ER - no driving for the next 4 hours.      Back Pain, Adult Low back pain is very common. About 1 in 5 people have back pain.The cause of low back pain is rarely dangerous. The pain often gets better over time.About half of people with a sudden onset of back pain feel better in just 2 weeks. About 8 in 10 people feel better by 6 weeks.  CAUSES Some common causes of back pain include:  Strain of the muscles or ligaments supporting the spine.  Wear and tear (degeneration) of the spinal discs.  Arthritis.  Direct injury to the back. DIAGNOSIS Most of the time, the direct cause of low back pain is not known.However, back pain can be treated effectively even when the exact cause of the pain is unknown.Answering your caregiver's questions about your overall health and symptoms is one of the most accurate ways to make sure the cause of your pain is not dangerous. If your caregiver needs more information, he or she may order lab work or imaging tests (X-rays or MRIs).However, even if imaging tests show changes in your back, this usually does not require surgery. HOME CARE INSTRUCTIONS For many people, back pain returns.Since low back pain is rarely dangerous, it is often a condition that people can learn to [facility] their own.   Remain active. It is stressful on the back to sit or stand  in one place. Do not sit, drive, or stand in one place for more than 30 minutes at a time. Take short walks on level surfaces as soon as pain allows.Try to increase the length of time you walk each day.  Do not stay in bed.Resting more than 1 or 2 days can delay your recovery.  Do not avoid exercise or work.Your body is made to move.It is not dangerous to be active, even though your back may hurt.Your back will likely heal faster if you return to being active before your pain is gone.  Pay attention to your body when you bend and lift. Many people have less discomfortwhen lifting if they bend their knees, keep the load close to their bodies,and avoid twisting. Often, the most comfortable positions are those that put less stress on your recovering back.  Find a comfortable position to sleep. Use a firm mattress and lie on your side with your knees slightly bent. If you lie on your back, put a pillow under your knees.  Only take over-the-counter or prescription medicines as directed by your caregiver. Over-the-counter medicines to reduce pain and inflammation are often the most helpful.Your caregiver may prescribe muscle relaxant drugs.These medicines help dull your pain so you can more quickly return to your normal activities and healthy exercise.  Put ice on the injured area.  Put ice in a plastic bag.  Place a towel between your skin and the  bag.  Leave the ice on for 15-20 minutes, 03-04 times a day for the first 2 to 3 days. After that, ice and heat may be alternated to reduce pain and spasms.  Ask your caregiver about trying back exercises and gentle massage. This may be of some benefit.  Avoid feeling anxious or stressed.Stress increases muscle tension and can worsen back pain.It is important to recognize when you are anxious or stressed and learn ways to manage it.Exercise is a great option. SEEK MEDICAL CARE IF:  You have pain that is not relieved with rest or  medicine.  You have pain that does not improve in 1 week.  You have new symptoms.  You are generally not feeling well. SEEK IMMEDIATE MEDICAL CARE IF:   You have pain that radiates from your back into your legs.  You develop new bowel or bladder control problems.  You have unusual weakness or numbness in your arms or legs.  You develop nausea or vomiting.  You develop abdominal pain.  You feel faint. Document Released: 07/22/2005 Document Revised: 01/21/2012 Document Reviewed: 11/23/2013 [facility] Patient Information 2015 [place], [place]. This information is not intended to replace advice given to you by your health care provider. Make sure you discuss any questions you have with your health care provider.    Degenerative Disk Disease Degenerative disk disease is a condition caused by the changes that occur in the cushions of the backbone (spinal disks) as you grow older. Spinal disks are soft and compressible disks located between the bones of the spine (vertebrae). They act like shock absorbers. Degenerative disk disease can affect the whole spine. However, the neck and lower back are most commonly affected. Many changes can occur in the spinal disks with aging, such as:  The spinal disks may dry and shrink.  Small tears may occur in the tough, outer covering of the disk (annulus).  The disk space may become smaller due to loss of water.  Abnormal growths in the bone (spurs) may occur. This can put pressure on the nerve roots exiting the spinal canal, causing pain.  The spinal canal may become narrowed. CAUSES  Degenerative disk disease is a condition caused by the changes that occur in the spinal disks with aging. The exact cause is not known, but there is a genetic basis for many patients. Degenerative changes can occur due to loss of fluid in the disk. This makes the disk thinner and reduces the space between the backbones. Small cracks can develop in the outer layer of the  disk. This can lead to the breakdown of the disk. You are more likely to get degenerative disk disease if you are overweight. Smoking cigarettes and doing heavy work such as weightlifting can also increase your risk of this condition. Degenerative changes can start after a sudden injury. Growth of bone spurs can compress the nerve roots and cause pain.  SYMPTOMS  The symptoms vary from person to person. Some people may have no pain, while others have severe pain. The pain may be so severe that it can limit your activities. The location of the pain depends on the part of your backbone that is affected. You will have neck or arm pain if a disk in the neck area is affected. You will have pain in your back, buttocks, or legs if a disk in the lower back is affected. The pain becomes worse while bending, reaching up, or with twisting movements. The pain may start gradually and then get worse  as time passes. It may also start after a major or minor injury. You may feel numbness or tingling in the arms or legs.  DIAGNOSIS  Your caregiver will ask you about your symptoms and about activities or habits that may cause the pain. He or she may also ask about any injuries, diseases, or treatments you have had earlier. Your caregiver will examine you to check for the range of movement that is possible in the affected area, to check for strength in your extremities, and to check for sensation in the areas of the arms and legs supplied by different nerve roots. An X-ray of the spine may be taken. Your caregiver may suggest other imaging tests, such as magnetic resonance imaging (MRI), if needed.  TREATMENT  Treatment includes rest, modifying your activities, and applying ice and heat. Your caregiver may prescribe medicines to reduce your pain and may ask you to do some exercises to strengthen your back. In some cases, you may need surgery. You and your caregiver will decide on the treatment that is best for you. HOME CARE  INSTRUCTIONS   Follow proper lifting and walking techniques as advised by your caregiver.  Maintain good posture.  Exercise regularly as advised.  Perform relaxation exercises.  Change your sitting, standing, and sleeping habits as advised. Change positions frequently.  Lose weight as advised.  Stop smoking if you smoke.  Wear supportive footwear. SEEK MEDICAL CARE IF:  Your pain does not go away within 1 to 4 weeks. SEEK IMMEDIATE MEDICAL CARE IF:   Your pain is severe.  You notice weakness in your arms, hands, or legs.  You begin to lose control of your bladder or bowel movements. MAKE SURE YOU:   Understand these instructions.  Will watch your condition.  Will get help right away if you are not doing well or get worse. Document Released: 05/19/2007 Document Revised: 10/14/2011 Document Reviewed: 11/23/2013 [facility] Patient Information 2015 [place], [place]. This information is not intended to replace advice given to you by your health care provider. Make sure you discuss any questions you have with your health care provider.

## 2014-10-15 NOTE — ED Notes (Signed)
Pt arrived from home by [facility] with c/o increased lower back pain x 1 week. Pt had surgery in January on lower back because of bulging disc. Pt stated that she has been out of pain medication since the end of Feb. Pt also has some tingling down bilateral legs. Stated she slipped out of bed last week but denies any injury. Pt does not have back brace and stated that she was never told to wear one.

## 2014-10-15 NOTE — ED Provider Notes (Signed)
CSN: [id number]     Arrival date & time 10/15/14  1053 History   First MD Initiated Contact with Patient 10/15/14 1129     Chief Complaint  Patient presents with  . Back Pain     (Consider location/radiation/quality/duration/timing/severity/associated sxs/prior Treatment) Patient is a 81 y.o. female presenting with back pain. The history is provided by the patient.  Back Pain Associated symptoms: no abdominal pain, no chest pain, no dysuria, no fever, no headaches, no numbness and no weakness   pt with hx degenerative disc disease, s/p lumbar diskectomy 08/09/14, c/o increased low back pain in the past 2-3 weeks. States 1 week ago had near fall, slip at home getting out of bed, states she grabbed sheets and slowly slid to floor landing on buttocks. Has been walking w her walker, per normal routine. No other fall or injury. Pain low back, mod-severe, dull, occasionally radiates to right>left leg. No leg numbness/weakness. No perineal numbness. No gi or gu c/o, specifically no retention or incontinence. Denies fever or chills.  Has run out of her percocet.      Past Medical History  Diagnosis Date  . Hypertension   . Shingles   . Back pain   . Intestinal obstruction   . Complication of anesthesia     resp distress -on vent after surgery  . GERD (gastroesophageal reflux disease)   . Headache     out grew them  . Arthritis     osteo  . Cancer     squamas cell on scalp--took 14 radiation tx--2013  . Macular degeneration of left eye     dx over 55 yrs ago.....hasn't changed much   Past Surgical History  Procedure Laterality Date  . Knee replacement surgery      bilateral  . Appendectomy    . Abdominal hysterectomy    . Small intestine surgery  2011    went into respiratory distress and was on ventilator after surgery  . Orif periprosthetic fracture  03/31/2012    Procedure: OPEN REDUCTION INTERNAL FIXATION (ORIF) PERIPROSTHETIC FRACTURE;  Surgeon: Mauri Pole, MD;  Location: [facility]  ORS;  Service: Orthopedics;  Laterality: Left;  Open reduction internal fixation Left distal femur periprosthetic fracture  . Eye surgery    . Cholecystectomy    . Lumbar laminectomy/decompression microdiscectomy Right 08/09/2014    Procedure: Right Lumbar Four to Five Microdiskectomy;  Surgeon: Floyce Stakes, MD;  Location: [facility];  Service: Neurosurgery;  Laterality: Right;  Right L4-5 Microdiskectomy   No family history on file. History  Substance Use Topics  . Smoking status: Former Smoker -- 10 years    Quit date: 03/31/1991  . Smokeless tobacco: Never Used  . Alcohol Use: No   OB History    No data available     Review of Systems  Constitutional: Negative for fever and chills.  HENT: Negative for sore throat.   Eyes: Negative for redness.  Respiratory: Negative for shortness of breath.   Cardiovascular: Negative for chest pain.  Gastrointestinal: Negative for vomiting and abdominal pain.  Genitourinary: Negative for dysuria, hematuria and flank pain.  Musculoskeletal: Positive for back pain. Negative for neck pain.  Skin: Negative for rash.  Neurological: Negative for weakness, numbness and headaches.  Hematological: Does not bruise/bleed easily.  Psychiatric/Behavioral: Negative for confusion.      Allergies  Sulfa antibiotics  Home Medications   Prior to Admission medications   Medication Sig Start Date End Date Taking? Authorizing Provider  carvedilol (  COREG) 25 MG tablet Take 25 mg by mouth 2 (two) times daily with a meal.    Historical Provider, MD  doxazosin (CARDURA) 8 MG tablet Take 8 mg by mouth at bedtime.    Historical Provider, MD  feeding supplement (ENSURE COMPLETE) LIQD Take 237 mLs by mouth 3 (three) times daily between meals. Patient not taking: Reported on 08/01/2014 04/08/12   Velvet Bathe, MD  fenofibrate micronized (LOFIBRA) 134 MG capsule Take 134 mg by mouth daily.    Historical Provider, MD  HYDROcodone-acetaminophen (NORCO/VICODIN)  5-325 MG per tablet Take 1 tablet by mouth every 4 (four) hours as needed for moderate pain.    Historical Provider, MD  losartan-hydrochlorothiazide (HYZAAR) 100-25 MG per tablet Take 1 tablet by mouth daily.    Historical Provider, MD  temazepam (RESTORIL) 30 MG capsule Take 30 mg by mouth at bedtime as needed. Sleep    Historical Provider, MD  traMADol (ULTRAM) 50 MG tablet Take 50 mg by mouth every 6 (six) hours as needed (pain).    Historical Provider, MD  valACYclovir (VALTREX) 1000 MG tablet Take 1,000 mg by mouth every 8 (eight) hours. Take for 7 days    Historical Provider, MD   BP 118/76 mmHg  Pulse 101  Temp(Src) 99 F (37.2 C) (Oral)  Resp 20  SpO2 94% Physical Exam  Constitutional: She is oriented to person, place, and time. She appears well-developed and well-nourished. No distress.  HENT:  Head: Atraumatic.  Eyes: Conjunctivae are normal. No scleral icterus.  Neck: Neck supple. No tracheal deviation present.  Cardiovascular: Normal rate, regular rhythm, normal heart sounds and intact distal pulses.   Pulmonary/Chest: Effort normal and breath sounds normal. No respiratory distress.  Abdominal: Soft. Normal appearance and bowel sounds are normal. She exhibits no distension and no mass. There is no tenderness. There is no rebound and no guarding.  No pulsatile mass  Genitourinary:  No cva tenderness  Musculoskeletal: She exhibits no edema.  Mid to upper lumbar tenderness, otherwise, CTLS spine, non tender, aligned, no step off. Good rom bil hips without pain. Distal pulses palp bil.   Neurological: She is alert and oriented to person, place, and time.  Straight leg raise neg. Motor intact bil, stre 5/5. sens grossly intact.   Skin: Skin is warm and dry. No rash noted.  Psychiatric: She has a normal mood and affect.  Nursing note and vitals reviewed.   ED Course  Procedures (including critical care time) Labs Review  Dg Lumbar Spine Complete  10/15/2014   CLINICAL  DATA:  Slipped off edge of bed 2 weeks ago onto lower back, history of surgery in January 2016, increased pain radiating down RIGHT leg since injury  EXAM: LUMBAR SPINE - COMPLETE 4+ VIEW  COMPARISON:  01/03/2014  FINDINGS: Five non-rib-bearing lumbar vertebrae.  Bones demineralized.  Multilevel disc space narrowing and endplate spur formation.  Facet degenerative changes lower lumbar spine.  Mild concavity at superior endplate of L2 unchanged.  Vertebral body heights otherwise maintained without fracture or subluxation.  No bone destruction or gross evidence of spondylolysis.  Scattered atherosclerotic calcifications aorta.  SI joints preserved.  IMPRESSION: Degenerative disc and facet disease changes lumbar spine with osseous demineralization.  No acute abnormalities.   Electronically Signed   By: Lavonia Dana M.D.   On: 10/15/2014 13:15      MDM   Pt requests pain shot. States she can have dilaudid but that she doesn't like morphine.  Dilaudid 1 mg im.  Xr.  Reviewed nursing notes and prior charts for additional history.   Recheck pain improved. Family w pt.  No resp depression or excessive drowsiness.   Discussed xr.  Pt appears stable for d/c.    Lajean Saver, MD 10/15/14 1341

## 2014-10-21 ENCOUNTER — Emergency Department (HOSPITAL_COMMUNITY): Payer: Medicare Other

## 2014-10-21 ENCOUNTER — Encounter (HOSPITAL_COMMUNITY): Payer: Self-pay | Admitting: Emergency Medicine

## 2014-10-21 ENCOUNTER — Emergency Department (HOSPITAL_COMMUNITY)
Admission: EM | Admit: 2014-10-21 | Discharge: 2014-10-21 | Disposition: A | Discharge status: Home / Self Care | Payer: Medicare Other | Attending: Emergency Medicine | Admitting: Emergency Medicine

## 2014-10-21 DIAGNOSIS — I10 Essential (primary) hypertension: Secondary | ICD-10-CM | POA: Insufficient documentation

## 2014-10-21 DIAGNOSIS — Z8719 Personal history of other diseases of the digestive system: Secondary | ICD-10-CM | POA: Insufficient documentation

## 2014-10-21 DIAGNOSIS — Z8739 Personal history of other diseases of the musculoskeletal system and connective tissue: Secondary | ICD-10-CM | POA: Diagnosis not present

## 2014-10-21 DIAGNOSIS — Z8619 Personal history of other infectious and parasitic diseases: Secondary | ICD-10-CM | POA: Insufficient documentation

## 2014-10-21 DIAGNOSIS — Z85828 Personal history of other malignant neoplasm of skin: Secondary | ICD-10-CM | POA: Insufficient documentation

## 2014-10-21 DIAGNOSIS — Z87891 Personal history of nicotine dependence: Secondary | ICD-10-CM | POA: Insufficient documentation

## 2014-10-21 DIAGNOSIS — M5186 Other intervertebral disc disorders, lumbar region: Secondary | ICD-10-CM | POA: Insufficient documentation

## 2014-10-21 DIAGNOSIS — M519 Unspecified thoracic, thoracolumbar and lumbosacral intervertebral disc disorder: Secondary | ICD-10-CM | POA: Diagnosis not present

## 2014-10-21 DIAGNOSIS — R52 Pain, unspecified: Secondary | ICD-10-CM | POA: Diagnosis not present

## 2014-10-21 DIAGNOSIS — M545 Low back pain: Secondary | ICD-10-CM | POA: Diagnosis not present

## 2014-10-21 DIAGNOSIS — T8131XA Disruption of external operation (surgical) wound, not elsewhere classified, initial encounter: Secondary | ICD-10-CM | POA: Diagnosis not present

## 2014-10-21 DIAGNOSIS — M47816 Spondylosis without myelopathy or radiculopathy, lumbar region: Secondary | ICD-10-CM | POA: Diagnosis not present

## 2014-10-21 DIAGNOSIS — M4806 Spinal stenosis, lumbar region: Secondary | ICD-10-CM | POA: Diagnosis not present

## 2014-10-21 DIAGNOSIS — Z79899 Other long term (current) drug therapy: Secondary | ICD-10-CM | POA: Diagnosis not present

## 2014-10-21 DIAGNOSIS — M5126 Other intervertebral disc displacement, lumbar region: Secondary | ICD-10-CM | POA: Diagnosis not present

## 2014-10-21 DIAGNOSIS — R296 Repeated falls: Secondary | ICD-10-CM | POA: Diagnosis not present

## 2014-10-21 MED ORDER — FENTANYL CITRATE 0.05 MG/ML IJ SOLN
50.0000 ug | Freq: Once | INTRAMUSCULAR | Status: AC
  Administered 2014-10-21: 50 ug via INTRAVENOUS
  Filled 2014-10-21: qty 2

## 2014-10-21 MED ORDER — CYCLOBENZAPRINE HCL 10 MG PO TABS: 10.0000 mg | ORAL_TABLET | Freq: Three times a day (TID) | ORAL | Status: DC | PRN

## 2014-10-21 MED ORDER — OXYCODONE-ACETAMINOPHEN 7.5-325 MG PO TABS: 1.0000 | ORAL_TABLET | ORAL | Status: DC | PRN

## 2014-10-21 MED ORDER — DIAZEPAM 5 MG/ML IJ SOLN
2.5000 mg | Freq: Once | INTRAMUSCULAR | Status: AC
  Administered 2014-10-21: 2.5 mg via INTRAVENOUS
  Filled 2014-10-21: qty 2

## 2014-10-21 MED ORDER — ONDANSETRON HCL 4 MG/2ML IJ SOLN
4.0000 mg | Freq: Once | INTRAMUSCULAR | Status: AC
  Administered 2014-10-21: 4 mg via INTRAVENOUS
  Filled 2014-10-21: qty 2

## 2014-10-21 MED ORDER — KETOROLAC TROMETHAMINE 30 MG/ML IJ SOLN
15.0000 mg | Freq: Once | INTRAMUSCULAR | Status: AC
  Administered 2014-10-21: 15 mg via INTRAVENOUS
  Filled 2014-10-21: qty 1

## 2014-10-21 NOTE — Progress Notes (Signed)
Orthopedic Tech Progress Note Patient Details:  [name] [birth year] [id number] [employer] called for brace order Patient ID: [name], female   DOB: 1932-03-26, 81 y.o.   MRN: [id number]   [name] 10/21/2014, 2:16 PM

## 2014-10-21 NOTE — ED Notes (Signed)
Assisted Doroteo Bradford, RN with in and out cath with success; family stepped out of room during procedure

## 2014-10-21 NOTE — Discharge Instructions (Signed)
Herniated Disk A herniated disk occurs when a disk in your spine bulges out too far. Your spine (backbone) is made up of bones called vertebrae. A disk with a spongy center is located between each pair of bones. These disks act as shock absorbers when you move. A herniated disk can cause pain and muscle weakness.  HOME CARE  Take all medicines as told by your doctor.  Rest for 2 days and then start moving.  Do not sit or stand for long periods of time.  Maintain good posture when sitting and standing.  Avoid moving in a way that causes pain, such as bending or lifting.  When you are able to start lifting things again:  [place] with your knees.  Keep your back straight.  Hold heavy objects close to your body.  If you are overweight, ask your doctor about starting a weight-loss program.  When you are able to start exercising, ask your doctor how much and what type of exercise is best for you.  Work with a physical therapist on stretching and strengthening exercises for your back.  Do not wear high-heeled shoes.  Do not sleep on your belly.  Do not smoke.  Keep all follow-up visits as told by your doctor. GET HELP IF:  You have back or neck pain that is not getting better after 4 weeks.  You have very bad pain in your back or neck.  You have a loss of feeling (numbness), tingling, or weakness along with pain. GET HELP RIGHT AWAY IF:  You have tingling, weakness, or loss of feeling that makes you unable to use your arms or legs.  You are not able to control when you pee (urinate) or poop (bowel movement).  You have dizziness or fainting.  You have shortness of breath. MAKE SURE YOU:  Understand these instructions.  Will watch your condition.  Will get help right away if you are not doing well or get worse. Document Released: 12/06/2013 Document Reviewed: 06/25/2013 [facility] Patient Information 2015 [place], [place]. This information is not intended to replace  advice given to you by your health care provider. Make sure you discuss any questions you have with your health care provider.

## 2014-10-21 NOTE — ED Notes (Signed)
Patient transported to MRI

## 2014-10-21 NOTE — ED Notes (Signed)
Pt arrived by [facility] with c/o chronic lower back pain. Pt had surgery in January to lower back. Pt was seen here on 3/12.

## 2014-10-21 NOTE — ED Provider Notes (Signed)
CSN: [id number]     Arrival date & time 10/21/14  8921 History   First MD Initiated Contact with Patient 10/21/14 [phone]     Chief Complaint  Patient presents with  . Back Pain      HPI Pt arrived by [facility] with c/o chronic lower back pain. Pt had surgery in January to lower back. Pt was seen here on 3/12.  No fecal or urinary incontinence. Past Medical History  Diagnosis Date  . Hypertension   . Shingles   . Back pain   . Intestinal obstruction   . Complication of anesthesia     resp distress -on vent after surgery  . GERD (gastroesophageal reflux disease)   . Headache     out grew them  . Arthritis     osteo  . Cancer     squamas cell on scalp--took 14 radiation tx--2013  . Macular degeneration of left eye     dx over 55 yrs ago.....hasn't changed much   Past Surgical History  Procedure Laterality Date  . Knee replacement surgery      bilateral  . Appendectomy    . Abdominal hysterectomy    . Small intestine surgery  2011    went into respiratory distress and was on ventilator after surgery  . Orif periprosthetic fracture  03/31/2012    Procedure: OPEN REDUCTION INTERNAL FIXATION (ORIF) PERIPROSTHETIC FRACTURE;  Surgeon: Mauri Pole, MD;  Location: [facility];  Service: Orthopedics;  Laterality: Left;  Open reduction internal fixation Left distal femur periprosthetic fracture  . Eye surgery    . Cholecystectomy    . Lumbar laminectomy/decompression microdiscectomy Right 08/09/2014    Procedure: Right Lumbar Four to Five Microdiskectomy;  Surgeon: Floyce Stakes, MD;  Location: [facility];  Service: Neurosurgery;  Laterality: Right;  Right L4-5 Microdiskectomy   No family history on file. History  Substance Use Topics  . Smoking status: Former Smoker -- 10 years    Quit date: 03/31/1991  . Smokeless tobacco: Never Used  . Alcohol Use: No   OB History    No data available     Review of Systems  All other systems reviewed and are negative  Allergies   Sulfa antibiotics  Home Medications   Prior to Admission medications   Medication Sig Start Date End Date Taking? Authorizing Provider  carvedilol (COREG) 25 MG tablet Take 25 mg by mouth 2 (two) times daily with a meal.    Historical Provider, MD  cyclobenzaprine (FLEXERIL) 10 MG tablet Take 1 tablet (10 mg total) by mouth 3 (three) times daily as needed for muscle spasms. 10/21/14   Leonard Schwartz, MD  doxazosin (CARDURA) 8 MG tablet Take 8 mg by mouth at bedtime.    Historical Provider, MD  feeding supplement (ENSURE COMPLETE) LIQD Take 237 mLs by mouth 3 (three) times daily between meals. Patient not taking: Reported on 08/01/2014 04/08/12   Velvet Bathe, MD  fenofibrate micronized (LOFIBRA) 134 MG capsule Take 134 mg by mouth daily.    Historical Provider, MD  losartan-hydrochlorothiazide (HYZAAR) 100-25 MG per tablet Take 1 tablet by mouth daily.    Historical Provider, MD  oxyCODONE-acetaminophen (PERCOCET) 7.5-325 MG per tablet Take 1 tablet by mouth every 4 (four) hours as needed for pain. 10/21/14   Leonard Schwartz, MD  temazepam (RESTORIL) 30 MG capsule Take 30 mg by mouth at bedtime as needed. Sleep    Historical Provider, MD  valACYclovir (VALTREX) 1000 MG tablet Take 1,000 mg  by mouth every 8 (eight) hours. Take for 7 days    Historical Provider, MD   BP 141/61 mmHg  Pulse 75  Temp(Src) 98.7 F (37.1 C) (Oral)  Resp 18  SpO2 100% Physical Exam Physical Exam  Nursing note and vitals reviewed. Constitutional: She is oriented to person, place, and time. She appears well-developed and well-nourished. No distress.  HENT:  Head: Normocephalic and atraumatic.  Eyes: Pupils are equal, round, and reactive to light.  Neck: Normal range of motion.  Cardiovascular: Normal rate and intact distal pulses.   Pulmonary/Chest: No respiratory distress.  Abdominal: Normal appearance. She exhibits no distension.  Musculoskeletal: Tenderness located primarily in the lumbar sacral area.   Postoperative scar looks healed.  No evidence of infection.   Neurological: She is alert and oriented to person, place, and time. No cranial nerve deficit.  No numbness or weakness in the legs.  Good sensory exam.  Good pulses.   Skin: Skin is warm and dry. No rash noted.  Psychiatric: She has a normal mood and affect. Her behavior is normal.   ED Course  Procedures (including critical care time)  Medications  ondansetron (ZOFRAN) injection 4 mg (4 mg Intravenous Given 10/21/14 1041)  fentaNYL (SUBLIMAZE) injection 50 mcg (50 mcg Intravenous Given 10/21/14 1041)  fentaNYL (SUBLIMAZE) injection 50 mcg (50 mcg Intravenous Given 10/21/14 1234)  ketorolac (TORADOL) 30 MG/ML injection 15 mg (15 mg Intravenous Given 10/21/14 1349)  diazepam (VALIUM) injection 2.5 mg (2.5 mg Intravenous Given 10/21/14 1351)    Labs Review Labs Reviewed - No data to display  Imaging Review Mr Lumbar Spine Wo Contrast  10/21/2014   CLINICAL DATA:  Lumbar surgery January 2016. Multiple falls. Pain. Weakness, right greater than left.  EXAM: MRI LUMBAR SPINE WITHOUT CONTRAST  TECHNIQUE: Multiplanar, multisequence MR imaging of the lumbar spine was performed. No intravenous contrast was administered.  COMPARISON:  12/01/2013  FINDINGS: The vertebral bodies of the lumbar spine are normal in size. The vertebral bodies of the lumbar spine are normal in alignment. There is normal bone marrow signal demonstrated throughout the vertebra. There is degenerative disc disease with disc height loss throughout the lumbar spine.  The spinal cord is normal in signal and contour. The cord terminates normally at L1 . The nerve roots of the cauda equina and the filum terminale are normal.  The visualized portions of the SI joints are unremarkable.  The imaged intra-abdominal contents are unremarkable.  T12-L1: Mild broad-based disc bulge. No evidence of neural foraminal stenosis. No central canal stenosis.  L1-L2: Mild broad-based disc bulge with  a right paracentral predominance. Mild bilateral lateral recess stenosis. No evidence of neural foraminal stenosis. Mild spinal stenosis.  L2-L3: Mild broad-based disc bulge. Mild bilateral facet arthropathy. Bilateral lateral recess stenosis. Mild spinal stenosis. No significant foraminal stenosis.  L3-L4: Moderate broad-based disc bulge. Moderate bilateral facet arthropathy with ligamentum flavum infolding resulting in mild spinal stenosis and bilateral lateral recess stenosis. No evidence of neural foraminal stenosis.  L4-L5: Right laminectomy defect. Large amount of intermediate signal within the site of the laminectomy defect extending into the lateral epidural space severely compressing the thecal sac and the right lateral recess likely with impingement of the intraspinal L5 nerve root. Mild broad-based disc bulge with left lateral recess stenosis. Moderate scratch them mild bilateral facet arthropathy.  L5-S1: Mild broad-based disc bulge with a left lateral disc osteophyte complex. Mild bilateral facet arthropathy. No evidence of neural foraminal stenosis. No central canal stenosis.  IMPRESSION:  1. At L4-5 there is a right laminectomy defect. Large amount of intermediate signal within the site of the laminectomy defect extending into the lateral epidural space severely compressing the thecal sac and the right lateral recess likely with impingement of the intraspinal L5 nerve root. Mild broad-based disc bulge with left lateral recess stenosis. Moderate scratch them mild bilateral facet arthropathy. 2. Lumbar spine spondylosis throughout the remainder of the lumbar spine as described above, without significant interval change compared with 12/01/2013.   Electronically Signed   By: Kathreen Devoid   On: 10/21/2014 12:42     EKG Interpretation None     I discussed the case with Dr. Joya Salm who reviewed her MRI scan.  He recommended pain management and a lumbar support and he was follow-up on Monday after 10  AM. MDM   Final diagnoses:  Pain  HNP (herniated nucleus pulposus), lumbar        Leonard Schwartz, MD 10/21/14 [phone]

## 2014-10-24 ENCOUNTER — Other Ambulatory Visit: Payer: Self-pay | Admitting: Neurosurgery

## 2014-10-24 ENCOUNTER — Encounter (HOSPITAL_COMMUNITY): Payer: Self-pay | Admitting: *Deleted

## 2014-10-24 MED ORDER — MUPIROCIN 2 % EX OINT
1.0000 "application " | TOPICAL_OINTMENT | Freq: Once | CUTANEOUS | Status: AC
  Administered 2014-10-25: 1 via TOPICAL
  Filled 2014-10-24: qty 22

## 2014-10-24 NOTE — H&P (Signed)
[name] is an 81 y.o. female.   Chief Complaint: lbp FWY:OVZCHYI who had a right l4-5 discectomy with relief of the pain at discharge. At home she fell landing in her buttocks and since then increase of lumbar pain seen in the er last weekend and by me today. Because of her clinical and radiological change she is admitted for surgery in am.  Past Medical History  Diagnosis Date  . Hypertension   . Shingles   . Back pain   . Intestinal obstruction   . Complication of anesthesia     resp distress -on vent after surgery  . GERD (gastroesophageal reflux disease)   . Headache     out grew them  . Arthritis     osteo  . Cancer     squamas cell on scalp--took 14 radiation tx--2013  . Macular degeneration of left eye     dx over 55 yrs ago.....hasn't changed much  . Stroke     Mini stroke at age 89 years old. No lasting deficits  . Anxiety     Past Surgical History  Procedure Laterality Date  . Knee replacement surgery      bilateral  . Appendectomy    . Abdominal hysterectomy    . Small intestine surgery  2011    went into respiratory distress and was on ventilator after surgery  . Orif periprosthetic fracture  03/31/2012    Procedure: OPEN REDUCTION INTERNAL FIXATION (ORIF) PERIPROSTHETIC FRACTURE;  Surgeon: Mauri Pole, MD;  Location: [facility];  Service: Orthopedics;  Laterality: Left;  Open reduction internal fixation Left distal femur periprosthetic fracture  . Eye surgery    . Cholecystectomy    . Lumbar laminectomy/decompression microdiscectomy Right 08/09/2014    Procedure: Right Lumbar Four to Five Microdiskectomy;  Surgeon: Floyce Stakes, MD;  Location: [facility];  Service: Neurosurgery;  Laterality: Right;  Right L4-5 Microdiskectomy  . Colonoscopy      History reviewed. No pertinent family history. Social History:  reports that she quit smoking about 23 years ago. She has never used smokeless tobacco. She reports that she does not drink alcohol or use  illicit drugs.  Allergies:  Allergies  Allergen Reactions  . Sulfa Antibiotics Swelling    Mouth and tongue swelling    No prescriptions prior to admission    No results found for this or any previous visit (from the past 48 hour(s)). No results found.  Review of Systems  Constitutional: Negative.   HENT: Negative.   Eyes: Negative.   Respiratory: Negative.   Cardiovascular: Negative.   Gastrointestinal: Negative.   Genitourinary: Negative.   Musculoskeletal: Positive for back pain.  Skin: Negative.   Neurological: Positive for sensory change and focal weakness.  Endo/Heme/Allergies: Negative.   Psychiatric/Behavioral: The patient is nervous/anxious.     There were no vitals taken for this visit. Physical Exam hent, nl. Neck, nl. Cv, nl. Lungs, clear. Abdomen, soft. Extremities, nl. NEURO weakness of DF right foot with SLR positive bilaterally at 30 degrees. She is in a wheelchair. Mri shows a very large hnp with severe displacement ot the thecal sac  Assessment/Plan Patient to go ahead with decompression and fusion at l4-5. She and her daughter are aware of risks and benefits  [name] M 10/24/2014, 5:00 PM

## 2014-10-25 ENCOUNTER — Inpatient Hospital Stay (HOSPITAL_COMMUNITY): Admission: RE | Admit: 2014-10-25 | Payer: Self-pay | Source: Ambulatory Visit | Admitting: Neurosurgery

## 2014-10-25 ENCOUNTER — Inpatient Hospital Stay (HOSPITAL_COMMUNITY)
Admission: RE | Admit: 2014-10-25 | Discharge: 2014-10-27 | DRG: 517 | Disposition: A | Discharge status: Home / Self Care | Payer: Medicare Other | Source: Ambulatory Visit | Attending: Neurosurgery | Admitting: Neurosurgery

## 2014-10-25 ENCOUNTER — Encounter (HOSPITAL_COMMUNITY): Admission: RE | Payer: Self-pay | Source: Ambulatory Visit

## 2014-10-25 ENCOUNTER — Encounter (HOSPITAL_COMMUNITY): Payer: Self-pay | Admitting: *Deleted

## 2014-10-25 ENCOUNTER — Inpatient Hospital Stay (HOSPITAL_COMMUNITY): Payer: Medicare Other | Admitting: Certified Registered Nurse Anesthetist

## 2014-10-25 ENCOUNTER — Encounter (HOSPITAL_COMMUNITY)
Admission: RE | Disposition: A | Discharge status: Home / Self Care | Payer: Self-pay | Source: Ambulatory Visit | Attending: Neurosurgery

## 2014-10-25 DIAGNOSIS — M4326 Fusion of spine, lumbar region: Secondary | ICD-10-CM | POA: Diagnosis not present

## 2014-10-25 DIAGNOSIS — H3530 Unspecified macular degeneration: Secondary | ICD-10-CM | POA: Diagnosis not present

## 2014-10-25 DIAGNOSIS — Z87891 Personal history of nicotine dependence: Secondary | ICD-10-CM

## 2014-10-25 DIAGNOSIS — Z1889 Other specified retained foreign body fragments: Secondary | ICD-10-CM | POA: Diagnosis not present

## 2014-10-25 DIAGNOSIS — Z8673 Personal history of transient ischemic attack (TIA), and cerebral infarction without residual deficits: Secondary | ICD-10-CM | POA: Diagnosis not present

## 2014-10-25 DIAGNOSIS — M545 Low back pain: Secondary | ICD-10-CM | POA: Diagnosis present

## 2014-10-25 DIAGNOSIS — K219 Gastro-esophageal reflux disease without esophagitis: Secondary | ICD-10-CM | POA: Diagnosis present

## 2014-10-25 DIAGNOSIS — M5126 Other intervertebral disc displacement, lumbar region: Secondary | ICD-10-CM | POA: Diagnosis present

## 2014-10-25 DIAGNOSIS — M1990 Unspecified osteoarthritis, unspecified site: Secondary | ICD-10-CM | POA: Diagnosis present

## 2014-10-25 DIAGNOSIS — I10 Essential (primary) hypertension: Secondary | ICD-10-CM | POA: Diagnosis not present

## 2014-10-25 DIAGNOSIS — Z79899 Other long term (current) drug therapy: Secondary | ICD-10-CM | POA: Diagnosis not present

## 2014-10-25 DIAGNOSIS — Z96653 Presence of artificial knee joint, bilateral: Secondary | ICD-10-CM | POA: Diagnosis not present

## 2014-10-25 DIAGNOSIS — Z85828 Personal history of other malignant neoplasm of skin: Secondary | ICD-10-CM | POA: Diagnosis not present

## 2014-10-25 DIAGNOSIS — M419 Scoliosis, unspecified: Secondary | ICD-10-CM | POA: Diagnosis not present

## 2014-10-25 DIAGNOSIS — F419 Anxiety disorder, unspecified: Secondary | ICD-10-CM | POA: Diagnosis present

## 2014-10-25 DIAGNOSIS — M5116 Intervertebral disc disorders with radiculopathy, lumbar region: Secondary | ICD-10-CM | POA: Diagnosis not present

## 2014-10-25 HISTORY — PX: LUMBAR DISC SURGERY: SHX700

## 2014-10-25 HISTORY — DX: Anxiety disorder, unspecified: F41.9

## 2014-10-25 HISTORY — DX: Cerebral infarction, unspecified: I63.9

## 2014-10-25 LAB — BASIC METABOLIC PANEL
Anion gap: 11 (ref 5–15)
BUN: 34 mg/dL — ABNORMAL HIGH (ref 6–23)
CO2: 29 mmol/L (ref 19–32)
Calcium: 10.2 mg/dL (ref 8.4–10.5)
Chloride: 99 mmol/L (ref 96–112)
Creatinine, Ser: 1.69 mg/dL — ABNORMAL HIGH (ref 0.50–1.10)
GFR calc Af Amer: 32 mL/min — ABNORMAL LOW (ref >90)
GFR calc non Af Amer: 27 mL/min — ABNORMAL LOW (ref >90)
Glucose, Bld: 180 mg/dL — ABNORMAL HIGH (ref 70–99)
Potassium: 3.1 mmol/L — ABNORMAL LOW (ref 3.5–5.1)
Sodium: 139 mmol/L (ref 135–145)

## 2014-10-25 LAB — CBC
HCT: 34.6 % — ABNORMAL LOW (ref 36.0–46.0)
Hemoglobin: 11.1 g/dL — ABNORMAL LOW (ref 12.0–15.0)
MCH: 29.3 pg (ref 26.0–34.0)
MCHC: 32.1 g/dL (ref 30.0–36.0)
MCV: 91.3 fL (ref 78.0–100.0)
PLATELETS: 341 10*3/uL (ref 150–400)
RBC: 3.79 MIL/uL — AB (ref 3.87–5.11)
RDW: 13.8 % (ref 11.5–15.5)
WBC: 13.3 10*3/uL — ABNORMAL HIGH (ref 4.0–10.5)

## 2014-10-25 LAB — SURGICAL PCR SCREEN
MRSA, PCR: NEGATIVE
Staphylococcus aureus: NEGATIVE

## 2014-10-25 SURGERY — POSTERIOR LUMBAR FUSION 1 LEVEL
Anesthesia: General | Site: Spine Lumbar | Laterality: Right

## 2014-10-25 SURGERY — POSTERIOR LUMBAR FUSION 1 LEVEL
Anesthesia: General

## 2014-10-25 MED ORDER — SODIUM CHLORIDE 0.9 % IJ SOLN
3.0000 mL | Freq: Two times a day (BID) | INTRAMUSCULAR | Status: DC
Start: 2014-10-25 — End: 2014-10-27
  Administered 2014-10-26 (×2): 3 mL via INTRAVENOUS

## 2014-10-25 MED ORDER — PANTOPRAZOLE SODIUM 40 MG PO TBEC: 40.0000 mg | DELAYED_RELEASE_TABLET | Freq: Every day | ORAL | Status: DC | PRN

## 2014-10-25 MED ORDER — ACETAMINOPHEN 325 MG PO TABS: 650.0000 mg | ORAL_TABLET | ORAL | Status: DC | PRN

## 2014-10-25 MED ORDER — LOSARTAN POTASSIUM 50 MG PO TABS
100.0000 mg | ORAL_TABLET | Freq: Every day | ORAL | Status: DC
  Administered 2014-10-26 – 2014-10-27 (×2): 100 mg via ORAL
  Filled 2014-10-25 (×3): qty 2

## 2014-10-25 MED ORDER — FENTANYL CITRATE 0.05 MG/ML IJ SOLN
INTRAMUSCULAR | Status: AC
  Filled 2014-10-25: qty 5

## 2014-10-25 MED ORDER — ROCURONIUM BROMIDE 50 MG/5ML IV SOLN
INTRAVENOUS | Status: AC
  Filled 2014-10-25: qty 1

## 2014-10-25 MED ORDER — 0.9 % SODIUM CHLORIDE (POUR BTL) OPTIME
TOPICAL | Status: DC | PRN
  Administered 2014-10-25: 1000 mL

## 2014-10-25 MED ORDER — PHENYLEPHRINE HCL 10 MG/ML IJ SOLN
INTRAMUSCULAR | Status: DC | PRN
  Administered 2014-10-25: 80 ug via INTRAVENOUS

## 2014-10-25 MED ORDER — AMLODIPINE BESYLATE 5 MG PO TABS
5.0000 mg | ORAL_TABLET | Freq: Every day | ORAL | Status: DC
  Administered 2014-10-26 – 2014-10-27 (×2): 5 mg via ORAL
  Filled 2014-10-25 (×3): qty 1

## 2014-10-25 MED ORDER — NEOSTIGMINE METHYLSULFATE 10 MG/10ML IV SOLN
INTRAVENOUS | Status: DC | PRN
  Administered 2014-10-25: 5 mg via INTRAVENOUS

## 2014-10-25 MED ORDER — FENTANYL CITRATE 0.05 MG/ML IJ SOLN
25.0000 ug | INTRAMUSCULAR | Status: DC | PRN
  Administered 2014-10-25: 25 ug via INTRAVENOUS
  Administered 2014-10-25: 50 ug via INTRAVENOUS

## 2014-10-25 MED ORDER — FENTANYL CITRATE 0.05 MG/ML IJ SOLN
INTRAMUSCULAR | Status: AC
  Filled 2014-10-25: qty 2

## 2014-10-25 MED ORDER — GLYCOPYRROLATE 0.2 MG/ML IJ SOLN
INTRAMUSCULAR | Status: DC | PRN
  Administered 2014-10-25: .9 mg via INTRAVENOUS

## 2014-10-25 MED ORDER — GLYCOPYRROLATE 0.2 MG/ML IJ SOLN
INTRAMUSCULAR | Status: AC
  Filled 2014-10-25: qty 5

## 2014-10-25 MED ORDER — MENTHOL 3 MG MT LOZG
1.0000 | LOZENGE | OROMUCOSAL | Status: DC | PRN
  Filled 2014-10-25: qty 9

## 2014-10-25 MED ORDER — SODIUM CHLORIDE 0.9 % IV SOLN: 250.0000 mL | INTRAVENOUS | Status: DC

## 2014-10-25 MED ORDER — OXYCODONE-ACETAMINOPHEN 5-325 MG PO TABS
1.0000 | ORAL_TABLET | ORAL | Status: DC | PRN
  Administered 2014-10-26 (×2): 2 via ORAL
  Administered 2014-10-26 (×3): 1 via ORAL
  Administered 2014-10-27: 2 via ORAL
  Filled 2014-10-25: qty 1
  Filled 2014-10-25: qty 2
  Filled 2014-10-25: qty 1
  Filled 2014-10-25 (×2): qty 2
  Filled 2014-10-25: qty 1

## 2014-10-25 MED ORDER — ONDANSETRON HCL 4 MG/2ML IJ SOLN
INTRAMUSCULAR | Status: AC
  Filled 2014-10-25: qty 2

## 2014-10-25 MED ORDER — ATORVASTATIN CALCIUM 40 MG PO TABS
40.0000 mg | ORAL_TABLET | Freq: Every day | ORAL | Status: DC
  Administered 2014-10-26 – 2014-10-27 (×2): 40 mg via ORAL
  Filled 2014-10-25 (×3): qty 1

## 2014-10-25 MED ORDER — MORPHINE SULFATE 2 MG/ML IJ SOLN
1.0000 mg | INTRAMUSCULAR | Status: DC | PRN
  Administered 2014-10-25: 2 mg via INTRAVENOUS
  Filled 2014-10-25: qty 1

## 2014-10-25 MED ORDER — DOXAZOSIN MESYLATE 8 MG PO TABS
8.0000 mg | ORAL_TABLET | Freq: Every day | ORAL | Status: DC
  Administered 2014-10-26 – 2014-10-27 (×2): 8 mg via ORAL
  Filled 2014-10-25 (×3): qty 1

## 2014-10-25 MED ORDER — VANCOMYCIN HCL 1000 MG IV SOLR
INTRAVENOUS | Status: AC
  Filled 2014-10-25: qty 1000

## 2014-10-25 MED ORDER — CEFAZOLIN SODIUM-DEXTROSE 2-3 GM-% IV SOLR
INTRAVENOUS | Status: DC | PRN
  Administered 2014-10-25: 2 g via INTRAVENOUS

## 2014-10-25 MED ORDER — LIDOCAINE HCL (CARDIAC) 20 MG/ML IV SOLN
INTRAVENOUS | Status: AC
  Filled 2014-10-25: qty 5

## 2014-10-25 MED ORDER — ONDANSETRON HCL 4 MG/2ML IJ SOLN
INTRAMUSCULAR | Status: DC | PRN
  Administered 2014-10-25: 4 mg via INTRAVENOUS

## 2014-10-25 MED ORDER — CEFAZOLIN SODIUM 1-5 GM-% IV SOLN
1.0000 g | Freq: Three times a day (TID) | INTRAVENOUS | Status: DC
Start: 2014-10-25 — End: 2014-10-25

## 2014-10-25 MED ORDER — LACTATED RINGERS IV SOLN: INTRAVENOUS | Status: DC

## 2014-10-25 MED ORDER — DEXAMETHASONE SODIUM PHOSPHATE 4 MG/ML IJ SOLN
INTRAMUSCULAR | Status: AC
  Filled 2014-10-25: qty 1

## 2014-10-25 MED ORDER — LACTATED RINGERS IV SOLN
INTRAVENOUS | Status: DC | PRN
  Administered 2014-10-25 (×2): via INTRAVENOUS

## 2014-10-25 MED ORDER — ROCURONIUM BROMIDE 100 MG/10ML IV SOLN
INTRAVENOUS | Status: DC | PRN
  Administered 2014-10-25: 30 mg via INTRAVENOUS

## 2014-10-25 MED ORDER — HYDROCHLOROTHIAZIDE 25 MG PO TABS
25.0000 mg | ORAL_TABLET | Freq: Every day | ORAL | Status: DC
  Administered 2014-10-26 – 2014-10-27 (×2): 25 mg via ORAL
  Filled 2014-10-25 (×3): qty 1

## 2014-10-25 MED ORDER — LOSARTAN POTASSIUM-HCTZ 100-25 MG PO TABS: 1.0000 | ORAL_TABLET | Freq: Every day | ORAL | Status: DC

## 2014-10-25 MED ORDER — CEFAZOLIN SODIUM 1-5 GM-% IV SOLN
1.0000 g | Freq: Two times a day (BID) | INTRAVENOUS | Status: AC
  Administered 2014-10-26: 1 g via INTRAVENOUS
  Filled 2014-10-25: qty 50

## 2014-10-25 MED ORDER — PROPOFOL 10 MG/ML IV BOLUS
INTRAVENOUS | Status: AC
  Filled 2014-10-25: qty 20

## 2014-10-25 MED ORDER — VANCOMYCIN HCL 1000 MG IV SOLR
INTRAVENOUS | Status: DC | PRN
  Administered 2014-10-25: 1000 mg via TOPICAL

## 2014-10-25 MED ORDER — CARVEDILOL 25 MG PO TABS
25.0000 mg | ORAL_TABLET | Freq: Two times a day (BID) | ORAL | Status: DC
  Administered 2014-10-26 – 2014-10-27 (×3): 25 mg via ORAL
  Filled 2014-10-25 (×6): qty 1

## 2014-10-25 MED ORDER — ACETAMINOPHEN 650 MG RE SUPP: 650.0000 mg | RECTAL | Status: DC | PRN

## 2014-10-25 MED ORDER — ONDANSETRON HCL 4 MG/2ML IJ SOLN: 4.0000 mg | INTRAMUSCULAR | Status: DC | PRN

## 2014-10-25 MED ORDER — NEOSTIGMINE METHYLSULFATE 10 MG/10ML IV SOLN
INTRAVENOUS | Status: AC
  Filled 2014-10-25: qty 2

## 2014-10-25 MED ORDER — SODIUM CHLORIDE 0.9 % IJ SOLN: 3.0000 mL | INTRAMUSCULAR | Status: DC | PRN

## 2014-10-25 MED ORDER — THROMBIN 20000 UNITS EX SOLR
CUTANEOUS | Status: DC | PRN
  Administered 2014-10-25: 17:00:00 via TOPICAL

## 2014-10-25 MED ORDER — DEXAMETHASONE SODIUM PHOSPHATE 4 MG/ML IJ SOLN
INTRAMUSCULAR | Status: DC | PRN
  Administered 2014-10-25: 4 mg via INTRAVENOUS

## 2014-10-25 MED ORDER — DIAZEPAM 5 MG PO TABS
5.0000 mg | ORAL_TABLET | Freq: Four times a day (QID) | ORAL | Status: DC | PRN
  Administered 2014-10-26 – 2014-10-27 (×4): 5 mg via ORAL
  Filled 2014-10-25 (×4): qty 1

## 2014-10-25 MED ORDER — PHENOL 1.4 % MT LIQD
1.0000 | OROMUCOSAL | Status: DC | PRN
  Filled 2014-10-25: qty 177

## 2014-10-25 MED ORDER — SODIUM CHLORIDE 0.9 % IV SOLN
INTRAVENOUS | Status: DC
Start: 2014-10-25 — End: 2014-10-27

## 2014-10-25 SURGICAL SUPPLY — 59 items
BENZOIN TINCTURE PRP APPL 2/3 (GAUZE/BANDAGES/DRESSINGS) ×2 IMPLANT
BLADE CLIPPER SURG (BLADE) IMPLANT
BUR ACORN 6.0 (BURR) ×2 IMPLANT
BUR MATCHSTICK NEURO 3.0 LAGG (BURR) ×2 IMPLANT
CANISTER SUCT 3000ML PPV (MISCELLANEOUS) ×2 IMPLANT
CONT SPEC 4OZ CLIKSEAL STRL BL (MISCELLANEOUS) ×2 IMPLANT
COVER BACK TABLE 60X90IN (DRAPES) ×2 IMPLANT
DRAPE C-ARM 42X72 X-RAY (DRAPES) ×4 IMPLANT
DRAPE LAPAROTOMY 100X72X124 (DRAPES) ×2 IMPLANT
DRAPE MICROSCOPE LEICA (MISCELLANEOUS) ×2 IMPLANT
DRAPE POUCH INSTRU U-SHP 10X18 (DRAPES) ×2 IMPLANT
DRSG PAD ABDOMINAL 8X10 ST (GAUZE/BANDAGES/DRESSINGS) IMPLANT
DURAPREP 26ML APPLICATOR (WOUND CARE) ×2 IMPLANT
ELECT REM PT RETURN 9FT ADLT (ELECTROSURGICAL) ×2
ELECTRODE REM PT RTRN 9FT ADLT (ELECTROSURGICAL) ×1 IMPLANT
EVACUATOR 1/8 PVC DRAIN (DRAIN) IMPLANT
GAUZE SPONGE 4X4 12PLY STRL (GAUZE/BANDAGES/DRESSINGS) ×2 IMPLANT
GAUZE SPONGE 4X4 16PLY XRAY LF (GAUZE/BANDAGES/DRESSINGS) ×2 IMPLANT
GLOVE BIO SURGEON STRL SZ7 (GLOVE) ×4 IMPLANT
GLOVE BIO SURGEON STRL SZ8 (GLOVE) ×4 IMPLANT
GLOVE BIOGEL M 8.0 STRL (GLOVE) ×2 IMPLANT
GLOVE BIOGEL PI IND STRL 7.0 (GLOVE) ×2 IMPLANT
GLOVE BIOGEL PI INDICATOR 7.0 (GLOVE) ×2
GLOVE EXAM NITRILE LRG STRL (GLOVE) IMPLANT
GLOVE EXAM NITRILE MD LF STRL (GLOVE) IMPLANT
GLOVE EXAM NITRILE XL STR (GLOVE) IMPLANT
GLOVE EXAM NITRILE XS STR PU (GLOVE) IMPLANT
GOWN STRL REUS W/ TWL LRG LVL3 (GOWN DISPOSABLE) IMPLANT
GOWN STRL REUS W/ TWL XL LVL3 (GOWN DISPOSABLE) ×3 IMPLANT
GOWN STRL REUS W/TWL 2XL LVL3 (GOWN DISPOSABLE) IMPLANT
GOWN STRL REUS W/TWL LRG LVL3 (GOWN DISPOSABLE)
GOWN STRL REUS W/TWL XL LVL3 (GOWN DISPOSABLE) ×3
KIT BASIN OR (CUSTOM PROCEDURE TRAY) ×2 IMPLANT
KIT ROOM TURNOVER OR (KITS) ×2 IMPLANT
NEEDLE HYPO 18GX1.5 BLUNT FILL (NEEDLE) IMPLANT
NEEDLE HYPO 21X1.5 SAFETY (NEEDLE) IMPLANT
NEEDLE HYPO 25X1 1.5 SAFETY (NEEDLE) IMPLANT
NS IRRIG 1000ML POUR BTL (IV SOLUTION) ×2 IMPLANT
PACK LAMINECTOMY NEURO (CUSTOM PROCEDURE TRAY) ×2 IMPLANT
PAD ARMBOARD 7.5X6 YLW CONV (MISCELLANEOUS) ×6 IMPLANT
PATTIES SURGICAL .5 X1 (DISPOSABLE) IMPLANT
PATTIES SURGICAL .5 X3 (DISPOSABLE) IMPLANT
RUBBERBAND STERILE (MISCELLANEOUS) ×4 IMPLANT
SPONGE LAP 4X18 X RAY DECT (DISPOSABLE) IMPLANT
SPONGE NEURO XRAY DETECT 1X3 (DISPOSABLE) IMPLANT
SPONGE SURGIFOAM ABS GEL 100 (HEMOSTASIS) ×2 IMPLANT
STRIP CLOSURE SKIN 1/2X4 (GAUZE/BANDAGES/DRESSINGS) ×2 IMPLANT
SUT VIC AB 1 CT1 18XBRD ANBCTR (SUTURE) ×1 IMPLANT
SUT VIC AB 1 CT1 8-18 (SUTURE) ×1
SUT VIC AB 2-0 CP2 18 (SUTURE) ×2 IMPLANT
SUT VIC AB 3-0 SH 8-18 (SUTURE) ×2 IMPLANT
SYR 20CC LL (SYRINGE) IMPLANT
SYR 20ML ECCENTRIC (SYRINGE) ×2 IMPLANT
SYR 5ML LL (SYRINGE) IMPLANT
TAPE CLOTH SURG 4X10 WHT LF (GAUZE/BANDAGES/DRESSINGS) ×2 IMPLANT
TOWEL OR 17X24 6PK STRL BLUE (TOWEL DISPOSABLE) ×2 IMPLANT
TOWEL OR 17X26 10 PK STRL BLUE (TOWEL DISPOSABLE) ×2 IMPLANT
TRAY FOLEY CATH 14FRSI W/METER (CATHETERS) ×2 IMPLANT
WATER STERILE IRR 1000ML POUR (IV SOLUTION) ×2 IMPLANT

## 2014-10-25 NOTE — Anesthesia Preprocedure Evaluation (Signed)
Anesthesia Evaluation  Patient identified by MRN, date of birth, ID band Patient awake    Reviewed: Allergy & Precautions, NPO status , Patient's Chart, lab work & pertinent test results  Airway Mallampati: II  TM Distance: >3 FB Neck ROM: Full    Dental   Pulmonary pneumonia -, former smoker,  breath sounds clear to auscultation        Cardiovascular hypertension, Rhythm:Regular Rate:Normal     Neuro/Psych    GI/Hepatic Neg liver ROS, GERD-  ,  Endo/Other  negative endocrine ROS  Renal/GU negative Renal ROS     Musculoskeletal  (+) Arthritis -,   Abdominal   Peds  Hematology   Anesthesia Other Findings   Reproductive/Obstetrics                             Anesthesia Physical Anesthesia Plan  ASA: III  Anesthesia Plan: General   Post-op Pain Management:    Induction: Intravenous  Airway Management Planned: Oral ETT  Additional Equipment:   Intra-op Plan:   Post-operative Plan: Possible Post-op intubation/ventilation  Informed Consent: I have reviewed the patients History and Physical, chart, labs and discussed the procedure including the risks, benefits and alternatives for the proposed anesthesia with the patient or authorized representative who has indicated his/her understanding and acceptance.   Dental advisory given  Plan Discussed with: CRNA and Anesthesiologist  Anesthesia Plan Comments:         Anesthesia Quick Evaluation

## 2014-10-25 NOTE — Transfer of Care (Signed)
Immediate Anesthesia Transfer of Care Note  Patient: [name]  Procedure(s) Performed: Procedure(s) with comments: Right Lumbar four-five Discectomy (Right) - Right Lumbar four-five Discectomy  Patient Location: PACU  Anesthesia Type:General  Level of Consciousness: awake and alert   Airway & Oxygen Therapy: Patient Spontanous Breathing and Patient connected to nasal cannula oxygen  Post-op Assessment: Report given to RN, Post -op Vital signs reviewed and stable and Patient moving all extremities X 4  Post vital signs: Reviewed and stable  Last Vitals:  Filed Vitals:   10/25/14 1722  BP: 137/51  Pulse: 70  Temp:   Resp: 20    Complications: No apparent anesthesia complications

## 2014-10-25 NOTE — Anesthesia Procedure Notes (Signed)
Procedure Name: Intubation Date/Time: 10/25/2014 3:46 PM Performed by: Ollen Bowl Pre-anesthesia Checklist: Patient identified, Timeout performed, Emergency Drugs available, Suction available and Patient being monitored Patient Re-evaluated:Patient Re-evaluated prior to inductionOxygen Delivery Method: Circle system utilized and Simple face mask Preoxygenation: Pre-oxygenation with 100% oxygen Intubation Type: IV induction Ventilation: Mask ventilation without difficulty Laryngoscope Size: Miller and 2 Grade View: Grade I Tube type: Subglottic suction tube Tube size: 7.5 mm Number of attempts: 1 Airway Equipment and Method: Patient positioned with wedge pillow and Stylet Placement Confirmation: ETT inserted through vocal cords under direct vision,  positive ETCO2 and breath sounds checked- equal and bilateral Secured at: 22 cm Tube secured with: Tape Dental Injury: Teeth and Oropharynx as per pre-operative assessment

## 2014-10-26 NOTE — Progress Notes (Signed)
Utilization review completed.

## 2014-10-26 NOTE — Progress Notes (Signed)
Patient ID: [name], female   DOB: 1932/02/14, 81 y.o.   MRN: [id number] Doing well. No leg pain but incisional. No weakness

## 2014-10-26 NOTE — Evaluation (Signed)
Physical Therapy Evaluation Patient Details Name: GWENDOLA HORNADAY MRN: [id number] DOB: May 11, 1932 Today's Date: 10/26/2014   History of Present Illness  81 y.o. s/p Right L4-L5 removal of seven free fragments of disk mostly Right L4-L5 posterolaterally. s/p recent fall at home.  Clinical Impression  Pt admitted with above diagnosis. Pt currently with functional limitations due to the deficits listed below (see PT Problem List). Pt progressing well overall.  HHPT safety eval recommended.  Will follow acutely and practice steps in am.  Pt will benefit from skilled PT to increase their independence and safety with mobility to allow discharge to the venue listed below.     Follow Up Recommendations Home health PT;Supervision/Assistance - 24 hour    Equipment Recommendations  None recommended by PT    Recommendations for Other Services       Precautions / Restrictions Precautions Precautions: Back Precaution Booklet Issued: Yes (comment) Precaution Comments: educated on back precautions Required Braces or Orthoses: Spinal Brace Spinal Brace: Applied in sitting position;Other (comment);Lumbar corset (can be used for comfort) Restrictions Weight Bearing Restrictions: No      Mobility  Bed Mobility Overal bed mobility: Needs Assistance Bed Mobility: Rolling;Sidelying to Sit;Sit to Sidelying Rolling: Supervision Sidelying to sit: Modified independent (Device/Increase time)     Sit to sidelying: Supervision General bed mobility comments: Cues for technique  Transfers Overall transfer level: Needs assistance Equipment used: None Transfers: Sit to/from Stand Sit to Stand: Supervision            Ambulation/Gait Ambulation/Gait assistance: Supervision;Min guard Ambulation Distance (Feet): 200 Feet Assistive device: None Gait Pattern/deviations: Step-through pattern;Decreased stride length   Gait velocity interpretation: Below normal speed for age/gender General Gait Details:  Pt needed occasional HHA.  No significant LOB but no challenges given.    Stairs            Wheelchair Mobility    Modified Rankin (Stroke Patients Only)       Balance                                             Pertinent Vitals/Pain Pain Assessment: 0-10 Pain Score: 6  Pain Location: back Pain Descriptors / Indicators: Sore Pain Intervention(s): Limited activity within patient's tolerance;Monitored during session;RN gave pain meds during session;Repositioned  VSS    Home Living Family/patient expects to be discharged to:: Private residence Living Arrangements: Alone Available Help at Discharge: Family Type of Home: Mobile home Home Access: Stairs to enter Entrance Stairs-Rails: Right;Left;Can reach both Entrance Stairs-Number of Steps: 3 Home Layout: One level Home Equipment: Walker - 4 wheels;Cane - quad;Adaptive equipment;Hand held shower head      Prior Function Level of Independence: Needs assistance   Gait / Transfers Assistance Needed: assists with steps     Comments: has cane but did not use AD PTA     Hand Dominance   Dominant Hand: Right    Extremity/Trunk Assessment   Upper Extremity Assessment: Defer to OT evaluation           Lower Extremity Assessment: Generalized weakness         Communication   Communication: HOH  Cognition Arousal/Alertness: Awake/alert Behavior During Therapy: WFL for tasks assessed/performed Overall Cognitive Status: Within Functional Limits for tasks assessed  General Comments General comments (skin integrity, edema, etc.): Showed pt how to don brace for comfort.      Exercises        Assessment/Plan    PT Assessment Patient needs continued PT services  PT Diagnosis Generalized weakness;Acute pain   PT Problem List Decreased activity tolerance;Decreased balance;Decreased mobility;Decreased knowledge of use of DME;Decreased safety  awareness;Decreased knowledge of precautions;Pain  PT Treatment Interventions DME instruction;Gait training;Stair training;Functional mobility training;Therapeutic activities;Therapeutic exercise;Balance training;Patient/family education   PT Goals (Current goals can be found in the Care Plan section) Acute Rehab PT Goals Patient Stated Goal: plans to leave tomorrow PT Goal Formulation: With patient Time For Goal Achievement: 11/02/14 Potential to Achieve Goals: Good    Frequency Min 5X/week   Barriers to discharge        Co-evaluation               End of Session Equipment Utilized During Treatment: Gait belt;Back brace Activity Tolerance: Patient limited by pain Patient left: in bed;with call bell/phone within reach;with family/visitor present Nurse Communication: Mobility status         Time: 1643-5391 PT Time Calculation (min) (ACUTE ONLY): 17 min   Charges:   PT Evaluation $Initial PT Evaluation Tier I: 1 Procedure     PT G CodesDenice Paradise October 29, 2014, 3:01 PM [facility] Acute Rehabilitation [phone] (pager)

## 2014-10-26 NOTE — Anesthesia Postprocedure Evaluation (Signed)
  Anesthesia Post-op Note  Patient: [name]  Procedure(s) Performed: Procedure(s) with comments: Right Lumbar four-five Discectomy (Right) - Right Lumbar four-five Discectomy  Patient Location: PACU  Anesthesia Type:General  Level of Consciousness: awake  Airway and Oxygen Therapy: Patient Spontanous Breathing  Post-op Pain: mild  Post-op Assessment: Post-op Vital signs reviewed  Post-op Vital Signs: Reviewed  Last Vitals:  Filed Vitals:   10/26/14 0752  BP: 120/62  Pulse: 95  Temp: 36.8 C  Resp: 18    Complications: No apparent anesthesia complications

## 2014-10-26 NOTE — Evaluation (Addendum)
Occupational Therapy Evaluation Patient Details Name: [name] MRN: [id number] DOB: [birth year] Today's Date: 10/26/2014    History of Present Illness 81 y.o. s/p Right L4-L5 removal of seven free fragments of disk mostly Right L4-L5 posterolaterally. s/p recent fall at home.   Clinical Impression   Pt s/p above. Pt independent with ADLs, PTA. Feel pt will benefit from acute OT to reinforce precautions and increase independence and safety prior to d/c.     Follow Up Recommendations  No OT follow up;Supervision/Assistance - 24 hour (at least initially)   Equipment Recommendations  Other (comment) (AE)    Recommendations for Other Services       Precautions / Restrictions Precautions Precautions: Back Precaution Booklet Issued: Yes (comment) Precaution Comments: educated on back precautions Restrictions Weight Bearing Restrictions: No      Mobility Bed Mobility Overal bed mobility: Needs Assistance Bed Mobility: Rolling;Sidelying to Sit;Sit to Supine Rolling: Supervision Sidelying to sit: Modified independent (Device/Increase time)   Sit to supine: Supervision   General bed mobility comments: Cues for technique-pt performed sit to supine instead of log rolling technique.  Transfers Overall transfer level: Needs assistance Equipment used: None Transfers: Sit to/from Stand Sit to Stand: Supervision         General transfer comment: instructed pt to put hands on knees    Balance  Supervision for ambulation.                                           ADL Overall ADL's : Needs assistance/impaired                     Lower Body Dressing: Sit to/from stand;Min assist;With adaptive equipment (pt reports she is familar with AE and using it for dressing and did not want to practice)   Toilet Transfer: Supervision/safety;Ambulation (bed)           Functional mobility during ADLs: Supervision/safety General ADL Comments: Educated  on AE/where she could purchase/cost-pt states she is familar with it from previous surgery. Discussed options for shower chair and multiple uses of 3 in 1, but pt plans to sponge bathe.       Vision     Perception     Praxis      Pertinent Vitals/Pain Pain Assessment: 0-10 Pain Score: 6  Pain Location: back Pain Descriptors / Indicators: Sore;Burning Pain Intervention(s): Patient requesting pain meds-RN notified;Monitored during session;Repositioned     Hand Dominance Right   Extremity/Trunk Assessment Upper Extremity Assessment Upper Extremity Assessment: Overall WFL for tasks assessed   Lower Extremity Assessment Lower Extremity Assessment: Defer to PT evaluation       Communication Communication Communication: HOH   Cognition Arousal/Alertness: Awake/alert Behavior During Therapy: WFL for tasks assessed/performed Overall Cognitive Status: Within Functional Limits for tasks assessed                     General Comments       Exercises       Shoulder Instructions      Home Living Family/patient expects to be discharged to:: Private residence Living Arrangements: Alone Available Help at Discharge: Family Type of Home: Mobile Home Home Access: Stairs to enter Entrance Stairs-Number of Steps: 3 Entrance Stairs-Rails: Right;Left;Can reach both Home Layout: One level     Bathroom Shower/Tub: Walk-in shower;Tub/shower unit;Tub only Shower/tub characteristics: Door (on walk-in shower)  Bathroom Toilet: Standard (sink close to one toilet)     Home Equipment: Walker - 4 wheels;Cane - quad;Adaptive equipment;Hand held shower head Adaptive Equipment: Reacher        Prior Functioning/Environment Level of Independence: Needs assistance  Gait / Transfers Assistance Needed: assists with steps          OT Diagnosis: Acute pain   OT Problem List: Decreased strength;Decreased range of motion;Decreased activity tolerance;Decreased knowledge of use of  DME or AE;Decreased knowledge of precautions;Pain   OT Treatment/Interventions: Self-care/ADL training;DME and/or AE instruction;Therapeutic activities;Patient/family education;Balance training    OT Goals(Current goals can be found in the care plan section) Acute Rehab OT Goals Patient Stated Goal: plans to leave tomorrow OT Goal Formulation: With patient Time For Goal Achievement: 11/02/14 Potential to Achieve Goals: Good ADL Goals Pt Will Perform Lower Body Bathing: with modified independence;with adaptive equipment;sit to/from stand Pt Will Transfer to Toilet: with modified independence;ambulating;regular height toilet Pt Will Perform Toileting - Clothing Manipulation and hygiene: with modified independence;sit to/from stand Additional ADL Goal #1: Pt will independently perform bed mobility (log roll technique) as precursor for ADLs.  OT Frequency: Min 2X/week   Barriers to D/C:            Co-evaluation              End of Session Equipment Utilized During Treatment: Gait belt Nurse Communication: Patient requests pain meds  Activity Tolerance: Patient tolerated treatment well Patient left: in bed;with call bell/phone within reach;with family/visitor present; SCD's reapplied   Time: 2919-1660 OT Time Calculation (min): 23 min Charges:  OT General Charges $OT Visit: 1 Procedure OT Evaluation $Initial OT Evaluation Tier I: 1 Procedure G-CodesBenito Mccreedy OTR/L [id number] 10/26/2014, 10:19 AM

## 2014-10-26 NOTE — Progress Notes (Signed)
Patient ID: [name], female   DOB: 1932/06/21, 81 y.o.   MRN: [id number] Stable, ambulating. Dc in am

## 2014-10-26 NOTE — Op Note (Signed)
[name], [name]                ACCOUNT NO.:  [id number]  MEDICAL RECORD NO.:  [id number]  LOCATION:  [id number]                        FACILITY:  [facility]  PHYSICIAN:  Leeroy Cha, M.D.   DATE OF BIRTH:  Jul 24, 1932  DATE OF PROCEDURE:  10/25/2014 DATE OF DISCHARGE:                              OPERATIVE REPORT   PREOPERATIVE DIAGNOSES:  Right L4-L5 extradural mass with decompression of the thecal sac.  Status post right L4-5 diskectomy.  Scoliosis. Degenerative disk disease.  POSTOPERATIVE DIAGNOSES:  Right L4-L5 extradural mass with decompression of the thecal sac.  Status post right L4-5 diskectomy.  Scoliosis. Degenerative disk disease.  PROCEDURES:  Right L4-L5 removal of seven free fragments of disk mostly posterolaterally.  Lysis of adhesion.  Microscope.  SURGEON:  Leeroy Cha, M.D.  ASSISTANT:  Marchia Meiers. Vertell Limber, M.D.  CLINICAL HISTORY:  The patient underwent right L4-5 diskectomy.  The patient did well after surgery, but she fell and since then, she had been complaining of pain down to the right leg.  She was seen in the emergency room this past Friday, which showed that she has a mass in the right L4-5 space.  There was quite a bit of compromise of the thecal sac.  Surgery was advised.  Because I saw that probably will be finding fragment of bones of facet arthropathy with instability, I just came here for 4-5 fusion.  During the Surgery, we found that she have several fragments of disk and there was no need to fuse her.  DESCRIPTION OF PROCEDURE:  The patient was taken to the OR, and after intubation, she was positioned on a prone manner.  The back was cleaned with Betadine and DuraPrep.  Drapes were applied.  Midline incision following the previous one was made and we retracted the muscle bilaterally.  Retraction was used.  We brought out the microscope into the area.  I explored immediately the right L4-L5 and what I found that there was at least seven pieces of  disk including endplate compromising the posterolateral aspect of the thecal sac.  All the fragments were removed.  I entered the disk space and it was absolutely emptied.  The patient had some adhesions and lysis was accomplished.  At the end, we had plenty of room for the L4 and L5 nerve root.  Dr. Vertell Limber came, looked at the operative site and agreed with me about no further surgery such as fusion that we were thinking.  The area was irrigated.  Valsalva maneuver twice was negative.  Then, the wound was closed using Vicryl and Steri-Strip.         ______________________________ Leeroy Cha, M.D.    EB/MEDQ  D:  10/25/2014  T:  10/26/2014  Job:  [id number]

## 2014-10-27 NOTE — Discharge Instructions (Signed)
Wound Care Leave incision open to air. You may shower. Do not scrub directly on incision.  Leave steri-strips on incision.  They will fall off by themselves. Do not put any creams, lotions, or ointments on incision. Activity Walk each and every day, increasing distance each day. No lifting greater than 5 lbs.  Avoid bending, arching, and twisting. No driving for 2 weeks; may ride as a passenger locally. If provided with back brace, wear when out of bed.  It is not necessary to wear in bed. Diet Resume your normal diet.  Return to Work Will be discussed at you follow up appointment. Call Your Doctor If Any of These Occur Redness, drainage, or swelling at the wound.  Temperature greater than 101 degrees. Severe pain not relieved by pain medication. Incision starts to come apart. Follow Up Appt Call today for appointment in 3-4 weeks ([phone]) or for problems.  If you have any hardware placed in your spine, you will need an x-ray before your appointment.

## 2014-10-27 NOTE — Progress Notes (Signed)
Physical Therapy Treatment Patient Details Name: [name] MRN: [id number] DOB: 23-Apr-1932 Today's Date: 10/27/2014    History of Present Illness 81 y.o. s/p Right L4-L5 removal of seven free fragments of disk mostly Right L4-L5 posterolaterally. s/p recent fall at home.    PT Comments    Pt admitted with above diagnosis. Pt currently with functional limitations due to the deficits listed below (see PT Problem List). Pt ambulating well overall and plans to use RW initially and progress to cane.  HHPT safety eval recommended.  Has 24 hour care prn.   Pt will benefit from skilled PT to increase their independence and safety with mobility to allow discharge to the venue listed below.    Follow Up Recommendations  Home health PT;Supervision/Assistance - 24 hour     Equipment Recommendations  None recommended by PT    Recommendations for Other Services       Precautions / Restrictions Precautions Precautions: Back Precaution Booklet Issued: Yes (comment) Precaution Comments: educated on back precautions Required Braces or Orthoses: Spinal Brace Spinal Brace: Other (comment) (for comfort and pt chose not to wear it today.) Restrictions Weight Bearing Restrictions: No    Mobility  Bed Mobility Overal bed mobility: Needs Assistance Bed Mobility: Rolling;Sidelying to Sit;Sit to Sidelying Rolling: Supervision Sidelying to sit: Modified independent (Device/Increase time)       General bed mobility comments: Cues for technique for log roll.   Transfers Overall transfer level: Needs assistance Equipment used: None Transfers: Sit to/from Stand Sit to Stand: Supervision         General transfer comment: Pt able to get up from bed.  Gets "catch" in back midway up and takes time to straighten all the way up.    Ambulation/Gait Ambulation/Gait assistance: Supervision Ambulation Distance (Feet): 350 Feet Assistive device: None;1 person hand held assist Gait  Pattern/deviations: Step-through pattern;Decreased stride length   Gait velocity interpretation: Below normal speed for age/gender General Gait Details: Pt needed occasional HHA.  No significant LOB but no challenges given.  Pt would benefit from using RW initially and progressing to cane as she tends to want to hold onto furniture.  Pt agrees.  Discussed why devices are safer.  Pt has a cane and states her daughter can borrow a RW.  Pt will also have help she needs at home.     Stairs Stairs: Yes Stairs assistance: Supervision Stair Management: Two rails;Alternating pattern;Forwards Number of Stairs: 4 General stair comments: Pt able to go up and down steps with bil rails without problems.  Daughters will always be with her per pt.   Wheelchair Mobility    Modified Rankin (Stroke Patients Only)       Balance Overall balance assessment: Needs assistance;History of Falls         Standing balance support: Bilateral upper extremity supported;During functional activity Standing balance-Leahy Scale: Poor Standing balance comment: Needs at least a single extremity support for balance.               High level balance activites: Direction changes;Turns;Sudden stops High Level Balance Comments: Needs min guard assist with challenges.    Cognition Arousal/Alertness: Awake/alert Behavior During Therapy: WFL for tasks assessed/performed Overall Cognitive Status: Within Functional Limits for tasks assessed                      Exercises      General Comments        Pertinent Vitals/Pain Pain Assessment: 0-10 Pain Score:  6  Pain Location: back Pain Descriptors / Indicators: Sore Pain Intervention(s): Limited activity within patient's tolerance;Monitored during session;Patient requesting pain meds-RN notified;Repositioned  VSS    Home Living                      Prior Function            PT Goals (current goals can now be found in the care plan  section) Progress towards PT goals: Progressing toward goals    Frequency  Min 5X/week    PT Plan Current plan remains appropriate    Co-evaluation             End of Session Equipment Utilized During Treatment: Gait belt Activity Tolerance: Patient limited by pain Patient left: in chair;with call bell/phone within reach     Time: 0956-1012 PT Time Calculation (min) (ACUTE ONLY): 16 min  Charges:  $Gait Training: 8-22 mins                    G CodesDenice Paradise 2014/11/17, 1:58 PM [facility] [facility] [phone] (pager)

## 2014-10-27 NOTE — Discharge Summary (Signed)
Physician Discharge Summary  Patient ID: [name] MRN: [id number] DOB/AGE: [birth year] 81 y.o.  Admit date: 10/25/2014 Discharge date: 10/27/2014  Admission Diagnoses:recurrent hnp post fall  Discharge Diagnoses:  Active Problems:   Lumbar herniated disc   Discharged Condition:no weakness  Hospital Course: surgery  Consults: none Significant Diagnostic Studies: mri  Treatments: discectomy  Discharge Exam: Blood pressure 123/71, pulse 84, temperature 98.7 F (37.1 C), temperature source Oral, resp. rate 18, height 5' 2.5" (1.588 m), weight 67.132 kg (148 lb), SpO2 98 %. Wound dry. No weakness  Disposition: home. To see me in 3 to 4 weeks     Medication List    ASK your doctor about these medications        amLODipine 5 MG tablet  Commonly known as:  NORVASC  Take 5 mg by mouth daily.     atorvastatin 40 MG tablet  Commonly known as:  LIPITOR  Take 40 mg by mouth daily.     calcium carbonate 750 MG chewable tablet  Commonly known as:  TUMS EX  Chew 1 tablet by mouth as needed for heartburn.     carvedilol 25 MG tablet  Commonly known as:  COREG  Take 25 mg by mouth 2 (two) times daily with a meal.     cyclobenzaprine 10 MG tablet  Commonly known as:  FLEXERIL  Take 1 tablet (10 mg total) by mouth 3 (three) times daily as needed for muscle spasms.     doxazosin 8 MG tablet  Commonly known as:  CARDURA  Take 8 mg by mouth daily.     feeding supplement (ENSURE COMPLETE) Liqd  Take 237 mLs by mouth 3 (three) times daily between meals.     losartan-hydrochlorothiazide 100-25 MG per tablet  Commonly known as:  HYZAAR  Take 1 tablet by mouth daily.     oxyCODONE-acetaminophen 7.5-325 MG per tablet  Commonly known as:  PERCOCET  Take 1 tablet by mouth every 4 (four) hours as needed for pain.     pantoprazole 40 MG tablet  Commonly known as:  PROTONIX  Take 40 mg by mouth daily as needed (GERD/cough).     temazepam 30 MG capsule  Commonly known as:   RESTORIL  Take 30 mg by mouth at bedtime. Sleep     traMADol-acetaminophen 37.5-325 MG per tablet  Commonly known as:  ULTRACET  Take 2 tablets by mouth 4 (four) times daily as needed (pain).     VISINE OP  Place 1 drop into both eyes daily as needed (dry eyes).         Signed: Floyce Stakes 10/27/2014, 8:53 AM

## 2014-10-28 NOTE — Progress Notes (Signed)
Patient alert and oriented, mae's well, voiding adequate amount of urine, swallowing without difficulty, c/o moderate pain and meds given prior to discharged. Patient discharged home with family. Script and discharged instructions given to patient. Patient and family stated understanding of instructions given. Tomma Rakers RN

## 2014-10-31 MED FILL — Sodium Chloride IV Soln 0.9%: INTRAVENOUS | Qty: 1000 | Status: AC

## 2014-10-31 MED FILL — Heparin Sodium (Porcine) Inj 1000 Unit/ML: INTRAMUSCULAR | Qty: 30 | Status: AC

## 2014-11-14 DIAGNOSIS — M5126 Other intervertebral disc displacement, lumbar region: Secondary | ICD-10-CM | POA: Diagnosis not present

## 2014-12-13 DIAGNOSIS — I10 Essential (primary) hypertension: Secondary | ICD-10-CM | POA: Diagnosis not present

## 2014-12-13 DIAGNOSIS — R4182 Altered mental status, unspecified: Secondary | ICD-10-CM | POA: Diagnosis not present

## 2014-12-13 DIAGNOSIS — M545 Low back pain: Secondary | ICD-10-CM | POA: Diagnosis not present

## 2014-12-13 DIAGNOSIS — I959 Hypotension, unspecified: Secondary | ICD-10-CM | POA: Diagnosis not present

## 2014-12-13 DIAGNOSIS — N390 Urinary tract infection, site not specified: Secondary | ICD-10-CM | POA: Diagnosis not present

## 2014-12-13 DIAGNOSIS — D649 Anemia, unspecified: Secondary | ICD-10-CM | POA: Diagnosis not present

## 2014-12-21 DIAGNOSIS — I10 Essential (primary) hypertension: Secondary | ICD-10-CM | POA: Diagnosis not present

## 2014-12-21 DIAGNOSIS — M545 Low back pain: Secondary | ICD-10-CM | POA: Diagnosis not present

## 2014-12-21 DIAGNOSIS — R4182 Altered mental status, unspecified: Secondary | ICD-10-CM | POA: Diagnosis not present

## 2014-12-21 DIAGNOSIS — F418 Other specified anxiety disorders: Secondary | ICD-10-CM | POA: Diagnosis not present

## 2014-12-21 DIAGNOSIS — D649 Anemia, unspecified: Secondary | ICD-10-CM | POA: Diagnosis not present

## 2015-01-17 DIAGNOSIS — E780 Pure hypercholesterolemia: Secondary | ICD-10-CM | POA: Diagnosis not present

## 2015-01-17 DIAGNOSIS — M545 Low back pain: Secondary | ICD-10-CM | POA: Diagnosis not present

## 2015-01-17 DIAGNOSIS — I10 Essential (primary) hypertension: Secondary | ICD-10-CM | POA: Diagnosis not present

## 2015-01-17 DIAGNOSIS — M1990 Unspecified osteoarthritis, unspecified site: Secondary | ICD-10-CM | POA: Diagnosis not present

## 2015-01-17 DIAGNOSIS — E119 Type 2 diabetes mellitus without complications: Secondary | ICD-10-CM | POA: Diagnosis not present

## 2015-01-17 DIAGNOSIS — D649 Anemia, unspecified: Secondary | ICD-10-CM | POA: Diagnosis not present

## 2015-02-22 DIAGNOSIS — K219 Gastro-esophageal reflux disease without esophagitis: Secondary | ICD-10-CM | POA: Diagnosis not present

## 2015-02-22 DIAGNOSIS — Z6826 Body mass index (BMI) 26.0-26.9, adult: Secondary | ICD-10-CM | POA: Diagnosis not present

## 2015-02-22 DIAGNOSIS — F418 Other specified anxiety disorders: Secondary | ICD-10-CM | POA: Diagnosis not present

## 2015-05-09 DIAGNOSIS — M1990 Unspecified osteoarthritis, unspecified site: Secondary | ICD-10-CM | POA: Diagnosis not present

## 2015-05-09 DIAGNOSIS — Z23 Encounter for immunization: Secondary | ICD-10-CM | POA: Diagnosis not present

## 2015-05-09 DIAGNOSIS — G4700 Insomnia, unspecified: Secondary | ICD-10-CM | POA: Diagnosis not present

## 2015-05-09 DIAGNOSIS — E119 Type 2 diabetes mellitus without complications: Secondary | ICD-10-CM | POA: Diagnosis not present

## 2015-05-30 DIAGNOSIS — I10 Essential (primary) hypertension: Secondary | ICD-10-CM | POA: Diagnosis not present

## 2015-05-30 DIAGNOSIS — E7800 Pure hypercholesterolemia, unspecified: Secondary | ICD-10-CM | POA: Diagnosis not present

## 2015-05-30 DIAGNOSIS — M545 Low back pain: Secondary | ICD-10-CM | POA: Diagnosis not present

## 2015-05-30 DIAGNOSIS — F418 Other specified anxiety disorders: Secondary | ICD-10-CM | POA: Diagnosis not present

## 2015-05-30 DIAGNOSIS — Z1389 Encounter for screening for other disorder: Secondary | ICD-10-CM | POA: Diagnosis not present

## 2015-05-30 DIAGNOSIS — E119 Type 2 diabetes mellitus without complications: Secondary | ICD-10-CM | POA: Diagnosis not present

## 2015-07-06 DIAGNOSIS — Z6827 Body mass index (BMI) 27.0-27.9, adult: Secondary | ICD-10-CM | POA: Diagnosis not present

## 2015-07-06 DIAGNOSIS — E119 Type 2 diabetes mellitus without complications: Secondary | ICD-10-CM | POA: Diagnosis not present

## 2015-07-06 DIAGNOSIS — N390 Urinary tract infection, site not specified: Secondary | ICD-10-CM | POA: Diagnosis not present

## 2015-09-19 DIAGNOSIS — Z1389 Encounter for screening for other disorder: Secondary | ICD-10-CM | POA: Diagnosis not present

## 2015-09-19 DIAGNOSIS — G4700 Insomnia, unspecified: Secondary | ICD-10-CM | POA: Diagnosis not present

## 2015-09-19 DIAGNOSIS — Z9181 History of falling: Secondary | ICD-10-CM | POA: Diagnosis not present

## 2015-09-19 DIAGNOSIS — E119 Type 2 diabetes mellitus without complications: Secondary | ICD-10-CM | POA: Diagnosis not present

## 2015-10-09 DIAGNOSIS — M5126 Other intervertebral disc displacement, lumbar region: Secondary | ICD-10-CM | POA: Diagnosis not present

## 2015-10-28 ENCOUNTER — Emergency Department (HOSPITAL_COMMUNITY)
Admission: EM | Admit: 2015-10-28 | Discharge: 2015-10-28 | Disposition: A | Discharge status: Home / Self Care | Payer: Medicare Other | Attending: Emergency Medicine | Admitting: Emergency Medicine

## 2015-10-28 ENCOUNTER — Encounter (HOSPITAL_COMMUNITY): Payer: Self-pay

## 2015-10-28 DIAGNOSIS — M1990 Unspecified osteoarthritis, unspecified site: Secondary | ICD-10-CM | POA: Insufficient documentation

## 2015-10-28 DIAGNOSIS — I10 Essential (primary) hypertension: Secondary | ICD-10-CM | POA: Insufficient documentation

## 2015-10-28 DIAGNOSIS — Z85828 Personal history of other malignant neoplasm of skin: Secondary | ICD-10-CM | POA: Diagnosis not present

## 2015-10-28 DIAGNOSIS — M549 Dorsalgia, unspecified: Secondary | ICD-10-CM | POA: Diagnosis present

## 2015-10-28 DIAGNOSIS — Z9889 Other specified postprocedural states: Secondary | ICD-10-CM | POA: Diagnosis not present

## 2015-10-28 DIAGNOSIS — Z79899 Other long term (current) drug therapy: Secondary | ICD-10-CM | POA: Insufficient documentation

## 2015-10-28 DIAGNOSIS — K219 Gastro-esophageal reflux disease without esophagitis: Secondary | ICD-10-CM | POA: Insufficient documentation

## 2015-10-28 DIAGNOSIS — Z87891 Personal history of nicotine dependence: Secondary | ICD-10-CM | POA: Diagnosis not present

## 2015-10-28 DIAGNOSIS — Z8673 Personal history of transient ischemic attack (TIA), and cerebral infarction without residual deficits: Secondary | ICD-10-CM | POA: Diagnosis not present

## 2015-10-28 DIAGNOSIS — G8929 Other chronic pain: Secondary | ICD-10-CM | POA: Diagnosis not present

## 2015-10-28 DIAGNOSIS — M5442 Lumbago with sciatica, left side: Secondary | ICD-10-CM | POA: Insufficient documentation

## 2015-10-28 DIAGNOSIS — Z8619 Personal history of other infectious and parasitic diseases: Secondary | ICD-10-CM | POA: Insufficient documentation

## 2015-10-28 DIAGNOSIS — F419 Anxiety disorder, unspecified: Secondary | ICD-10-CM | POA: Insufficient documentation

## 2015-10-28 MED ORDER — GABAPENTIN 100 MG PO CAPS: 200.0000 mg | ORAL_CAPSULE | Freq: Three times a day (TID) | ORAL | Status: DC

## 2015-10-28 MED ORDER — OXYCODONE-ACETAMINOPHEN 5-325 MG PO TABS: 1.0000 | ORAL_TABLET | Freq: Four times a day (QID) | ORAL | Status: DC | PRN

## 2015-10-28 NOTE — ED Notes (Signed)
Pt. Having lower back pain, hx of surgery and back pain.  She denies any injuries but the  Pain has become worse this week.  She hasw numbness in her lt. Foot which is old.  Skin is warm and dry.

## 2015-10-28 NOTE — Discharge Instructions (Signed)
Chronic Back Pain  When back pain lasts longer than 3 months, it is called chronic back pain.People with chronic back pain often go through certain periods that are more intense (flare-ups).  CAUSES Chronic back pain can be caused by wear and tear (degeneration) on different structures in your back. These structures include:  The bones of your spine (vertebrae) and the joints surrounding your spinal cord and nerve roots (facets).  The strong, fibrous tissues that connect your vertebrae (ligaments). Degeneration of these structures may result in pressure on your nerves. This can lead to constant pain. HOME CARE INSTRUCTIONS  Avoid bending, heavy lifting, prolonged sitting, and activities which make the problem worse.  Take brief periods of rest throughout the day to reduce your pain. Lying down or standing usually is better than sitting while you are resting.  Take over-the-counter or prescription medicines only as directed by your caregiver. SEEK IMMEDIATE MEDICAL CARE IF:   You have weakness or numbness in one of your legs or feet.  You have trouble controlling your bladder or bowels.  You have nausea, vomiting, abdominal pain, shortness of breath, or fainting.   This information is not intended to replace advice given to you by your health care provider. Make sure you discuss any questions you have with your health care provider.   Document Released: 08/29/2004 Document Revised: 10/14/2011 Document Reviewed: 01/09/2015 Elsevier Interactive Patient Education [employer].

## 2015-10-28 NOTE — ED Provider Notes (Signed)
CSN: [id number]     Arrival date & time 10/28/15  1409 History   First MD Initiated Contact with Patient 10/28/15 1559     Chief Complaint  Patient presents with  . Back Pain     Patient is a 82 y.o. female presenting with back pain. The history is provided by the patient.  Back Pain Associated symptoms: numbness   Associated symptoms: no abdominal pain, no chest pain and no fever    patient presents with lower back pain. She's had over last few weeks but has a history of chronic back pain. His had previous surgeries for Dr. Joya Salm. Around 2 weeks ago she was seen by Dr. Joya Salm in the office and started on steroids and Neurontin. States she had been feeling better but the pain as returned. Unrelieved with her medicines at home. She is on 100 mg Neurontin 3 times a day. Also had been on prednisone is finished that up. She has some chronic numbness in her left foot. The pain radiates down the left leg, which is chronic for her. No fevers or chills. No fall. No loss of bladder bowel control. No urinary retention.  Past Medical History  Diagnosis Date  . Hypertension   . Shingles   . Back pain   . Intestinal obstruction ([place])   . Complication of anesthesia     resp distress -on vent after surgery  . GERD (gastroesophageal reflux disease)   . Headache     out grew them  . Arthritis     osteo  . Cancer ([place])     squamas cell on scalp--took 14 radiation tx--2013  . Macular degeneration of left eye     dx over 55 yrs ago.....hasn't changed much  . Stroke [facility])     Mini stroke at age 82 years old. No lasting deficits  . Anxiety    Past Surgical History  Procedure Laterality Date  . Knee replacement surgery      bilateral  . Appendectomy    . Abdominal hysterectomy    . Small intestine surgery  2011    went into respiratory distress and was on ventilator after surgery  . Orif periprosthetic fracture  03/31/2012    Procedure: OPEN REDUCTION INTERNAL FIXATION (ORIF) PERIPROSTHETIC  FRACTURE;  Surgeon: Mauri Pole, MD;  Location: [facility] ORS;  Service: Orthopedics;  Laterality: Left;  Open reduction internal fixation Left distal femur periprosthetic fracture  . Eye surgery    . Cholecystectomy    . Lumbar laminectomy/decompression microdiscectomy Right 08/09/2014    Procedure: Right Lumbar Four to Five Microdiskectomy;  Surgeon: Floyce Stakes, MD;  Location: [facility] ORS;  Service: Neurosurgery;  Laterality: Right;  Right L4-5 Microdiskectomy  . Colonoscopy     No family history on file. Social History  Substance Use Topics  . Smoking status: Former Smoker -- 10 years    Quit date: 03/31/1991  . Smokeless tobacco: Never Used  . Alcohol Use: No   OB History    No data available     Review of Systems  Constitutional: Negative for fever and appetite change.  Respiratory: Negative for shortness of breath.   Cardiovascular: Negative for chest pain.  Gastrointestinal: Negative for abdominal pain.  Musculoskeletal: Positive for back pain.  Skin: Negative for rash and wound.  Neurological: Positive for numbness.      Allergies  Sulfa antibiotics  Home Medications   Prior to Admission medications   Medication Sig Start Date End Date Taking? Authorizing  Provider  amLODipine (NORVASC) 5 MG tablet Take 5 mg by mouth daily.    Historical Provider, MD  atorvastatin (LIPITOR) 40 MG tablet Take 40 mg by mouth daily.    Historical Provider, MD  calcium carbonate (TUMS EX) 750 MG chewable tablet Chew 1 tablet by mouth as needed for heartburn.    Historical Provider, MD  carvedilol (COREG) 25 MG tablet Take 25 mg by mouth 2 (two) times daily with a meal.    Historical Provider, MD  cyclobenzaprine (FLEXERIL) 10 MG tablet Take 1 tablet (10 mg total) by mouth 3 (three) times daily as needed for muscle spasms. 10/21/14   Leonard Schwartz, MD  doxazosin (CARDURA) 8 MG tablet Take 8 mg by mouth daily.     Historical Provider, MD  feeding supplement (ENSURE COMPLETE) LIQD Take 237  mLs by mouth 3 (three) times daily between meals. 04/08/12   Velvet Bathe, MD  gabapentin (NEURONTIN) 100 MG capsule Take 2 capsules (200 mg total) by mouth 3 (three) times daily. 10/28/15   Davonna Belling, MD  losartan-hydrochlorothiazide (HYZAAR) 100-25 MG per tablet Take 1 tablet by mouth daily.    Historical Provider, MD  oxyCODONE-acetaminophen (PERCOCET/ROXICET) 5-325 MG tablet Take 1-2 tablets by mouth every 6 (six) hours as needed for severe pain. 10/28/15   Davonna Belling, MD  pantoprazole (PROTONIX) 40 MG tablet Take 40 mg by mouth daily as needed (GERD/cough).    Historical Provider, MD  temazepam (RESTORIL) 30 MG capsule Take 30 mg by mouth at bedtime. Sleep    Historical Provider, MD  Tetrahydrozoline HCl (VISINE OP) Place 1 drop into both eyes daily as needed (dry eyes).    Historical Provider, MD  traMADol-acetaminophen (ULTRACET) 37.5-325 MG per tablet Take 2 tablets by mouth 4 (four) times daily as needed (pain).    Historical Provider, MD   BP 143/74 mmHg  Pulse 65  Temp(Src) 98 F (36.7 C) (Oral)  Resp 18  Ht _0  (1.575 m)  Wt 151 lb (68.493 kg)  BMI 27.61 kg/m2  SpO2 98% Physical Exam  Constitutional: She appears well-developed.  Cardiovascular: Normal rate.   Pulmonary/Chest: Effort normal.  Abdominal: Soft.  Musculoskeletal:  Decreased straight leg raise on left. Area of numbness over dorsum of left foot laterally. Good flexion-extension of the ankle. Tenderness over left lower back area. No rash.  Skin: Skin is warm. No rash noted. There is erythema.    ED Course  Procedures (including critical care time) Labs Review Labs Reviewed - No data to display  Imaging Review No results found. I have personally reviewed and evaluated these images and lab results as part of my medical decision-making.   EKG Interpretation None      MDM   Final diagnoses:  Left-sided low back pain with left-sided sciatica    Patient with acute on chronic back pain. Had  x-ray by neurosurgery 2 weeks ago. Pains improved in ER. Not asking for medications IV here. We'll give 8 pills of Percocet which she has had in the past and increase her Neurontin. Will follow-up with Dr. Joya Salm.  no red flags at this time.    Davonna Belling, MD 10/28/15 [phone]

## 2015-11-16 DIAGNOSIS — M5126 Other intervertebral disc displacement, lumbar region: Secondary | ICD-10-CM | POA: Diagnosis not present

## 2015-12-19 DIAGNOSIS — Z139 Encounter for screening, unspecified: Secondary | ICD-10-CM | POA: Diagnosis not present

## 2015-12-19 DIAGNOSIS — E119 Type 2 diabetes mellitus without complications: Secondary | ICD-10-CM | POA: Diagnosis not present

## 2015-12-19 DIAGNOSIS — M545 Low back pain: Secondary | ICD-10-CM | POA: Diagnosis not present

## 2015-12-19 DIAGNOSIS — E7800 Pure hypercholesterolemia, unspecified: Secondary | ICD-10-CM | POA: Diagnosis not present

## 2015-12-19 DIAGNOSIS — I10 Essential (primary) hypertension: Secondary | ICD-10-CM | POA: Diagnosis not present

## 2015-12-19 DIAGNOSIS — Z79899 Other long term (current) drug therapy: Secondary | ICD-10-CM | POA: Diagnosis not present

## 2016-02-19 DIAGNOSIS — M1990 Unspecified osteoarthritis, unspecified site: Secondary | ICD-10-CM | POA: Diagnosis not present

## 2016-03-28 DIAGNOSIS — M5126 Other intervertebral disc displacement, lumbar region: Secondary | ICD-10-CM | POA: Diagnosis not present

## 2016-04-01 DIAGNOSIS — I10 Essential (primary) hypertension: Secondary | ICD-10-CM | POA: Diagnosis not present

## 2016-04-01 DIAGNOSIS — E663 Overweight: Secondary | ICD-10-CM | POA: Diagnosis not present

## 2016-04-01 DIAGNOSIS — M545 Low back pain: Secondary | ICD-10-CM | POA: Diagnosis not present

## 2016-04-01 DIAGNOSIS — M1990 Unspecified osteoarthritis, unspecified site: Secondary | ICD-10-CM | POA: Diagnosis not present

## 2016-04-01 DIAGNOSIS — E119 Type 2 diabetes mellitus without complications: Secondary | ICD-10-CM | POA: Diagnosis not present

## 2016-05-25 DIAGNOSIS — S335XXA Sprain of ligaments of lumbar spine, initial encounter: Secondary | ICD-10-CM | POA: Diagnosis not present

## 2016-05-30 DIAGNOSIS — M5416 Radiculopathy, lumbar region: Secondary | ICD-10-CM | POA: Diagnosis not present

## 2016-05-31 DIAGNOSIS — M5416 Radiculopathy, lumbar region: Secondary | ICD-10-CM | POA: Diagnosis not present

## 2016-05-31 DIAGNOSIS — Z4789 Encounter for other orthopedic aftercare: Secondary | ICD-10-CM | POA: Diagnosis not present

## 2016-05-31 DIAGNOSIS — S728X2D Other fracture of left femur, subsequent encounter for closed fracture with routine healing: Secondary | ICD-10-CM | POA: Diagnosis not present

## 2016-07-02 ENCOUNTER — Other Ambulatory Visit: Payer: Self-pay | Admitting: Neurosurgery

## 2016-07-02 DIAGNOSIS — M5416 Radiculopathy, lumbar region: Secondary | ICD-10-CM

## 2016-07-05 ENCOUNTER — Ambulatory Visit
Admission: RE | Admit: 2016-07-05 | Discharge: 2016-07-05 | Disposition: A | Discharge status: Home / Self Care | Payer: Medicare Other | Source: Ambulatory Visit | Attending: Neurosurgery | Admitting: Neurosurgery

## 2016-07-05 DIAGNOSIS — M5416 Radiculopathy, lumbar region: Secondary | ICD-10-CM

## 2016-07-05 MED ORDER — GADOBENATE DIMEGLUMINE 529 MG/ML IV SOLN
15.0000 mL | Freq: Once | INTRAVENOUS | Status: AC | PRN
  Administered 2016-07-05: 13 mL via INTRAVENOUS

## 2016-11-08 DIAGNOSIS — M272 Inflammatory conditions of jaws: Secondary | ICD-10-CM | POA: Diagnosis not present

## 2016-11-13 DIAGNOSIS — I700 Atherosclerosis of aorta: Secondary | ICD-10-CM | POA: Diagnosis not present

## 2016-11-13 DIAGNOSIS — J9811 Atelectasis: Secondary | ICD-10-CM | POA: Diagnosis not present

## 2016-11-13 DIAGNOSIS — I10 Essential (primary) hypertension: Secondary | ICD-10-CM | POA: Diagnosis not present

## 2016-11-13 DIAGNOSIS — L98491 Non-pressure chronic ulcer of skin of other sites limited to breakdown of skin: Secondary | ICD-10-CM | POA: Diagnosis not present

## 2016-11-13 DIAGNOSIS — L598 Other specified disorders of the skin and subcutaneous tissue related to radiation: Secondary | ICD-10-CM | POA: Diagnosis not present

## 2016-11-13 DIAGNOSIS — M8788 Other osteonecrosis, other site: Secondary | ICD-10-CM | POA: Diagnosis not present

## 2016-11-15 DIAGNOSIS — R739 Hyperglycemia, unspecified: Secondary | ICD-10-CM | POA: Diagnosis not present

## 2016-11-21 DIAGNOSIS — E119 Type 2 diabetes mellitus without complications: Secondary | ICD-10-CM | POA: Diagnosis not present

## 2016-11-21 DIAGNOSIS — S01502A Unspecified open wound of oral cavity, initial encounter: Secondary | ICD-10-CM | POA: Diagnosis not present

## 2016-12-03 ENCOUNTER — Emergency Department (HOSPITAL_COMMUNITY): Payer: Medicare Other

## 2016-12-03 ENCOUNTER — Encounter (HOSPITAL_COMMUNITY): Payer: Self-pay | Admitting: Emergency Medicine

## 2016-12-03 ENCOUNTER — Emergency Department (HOSPITAL_COMMUNITY)
Admission: EM | Admit: 2016-12-03 | Discharge: 2016-12-03 | Disposition: A | Discharge status: Home / Self Care | Payer: Medicare Other | Attending: Physician Assistant | Admitting: Physician Assistant

## 2016-12-03 DIAGNOSIS — Z79899 Other long term (current) drug therapy: Secondary | ICD-10-CM | POA: Diagnosis not present

## 2016-12-03 DIAGNOSIS — Z8673 Personal history of transient ischemic attack (TIA), and cerebral infarction without residual deficits: Secondary | ICD-10-CM | POA: Insufficient documentation

## 2016-12-03 DIAGNOSIS — M5442 Lumbago with sciatica, left side: Secondary | ICD-10-CM | POA: Diagnosis not present

## 2016-12-03 DIAGNOSIS — I10 Essential (primary) hypertension: Secondary | ICD-10-CM | POA: Insufficient documentation

## 2016-12-03 DIAGNOSIS — Z87891 Personal history of nicotine dependence: Secondary | ICD-10-CM | POA: Insufficient documentation

## 2016-12-03 DIAGNOSIS — M545 Low back pain: Secondary | ICD-10-CM | POA: Diagnosis not present

## 2016-12-03 DIAGNOSIS — M5432 Sciatica, left side: Secondary | ICD-10-CM

## 2016-12-03 MED ORDER — HYDROCODONE-ACETAMINOPHEN 5-325 MG PO TABS
1.0000 | ORAL_TABLET | Freq: Once | ORAL | Status: AC
  Administered 2016-12-03: 1 via ORAL
  Filled 2016-12-03: qty 1

## 2016-12-03 MED ORDER — LIDOCAINE 5 % EX PTCH
1.0000 | MEDICATED_PATCH | CUTANEOUS | Status: DC
  Administered 2016-12-03: 1 via TRANSDERMAL
  Filled 2016-12-03: qty 1

## 2016-12-03 NOTE — ED Notes (Signed)
Pt refusing discharge vitals at this time. Pt is anxiously awaiting discharge.

## 2016-12-03 NOTE — ED Notes (Signed)
Patient transported to X-ray 

## 2016-12-03 NOTE — ED Triage Notes (Signed)
Pt reports hx of back surgery, states she lifted a planter 2 days ago and began having worse lumbar pain that radiated down her left leg. Pt denies urinary or bowel incontinence.

## 2016-12-03 NOTE — ED Notes (Signed)
c/o left lower back pain states she bend over to  Pick up a planter on Friday and Sunday her back started hurting with pain and tingling radiating down her left leg.

## 2016-12-03 NOTE — ED Notes (Signed)
Patient returned from x ray.  Tech states patient unable to complete x ray due to pain.

## 2016-12-03 NOTE — ED Triage Notes (Signed)
Pt returned from X-ray . Transporter  Reported Pt was in so much pain the x-ray was not done. Pt last had pain meds at 1153.

## 2016-12-03 NOTE — ED Provider Notes (Signed)
[facility] DEPT Provider Note   CSN: [id number] Arrival date & time: 12/03/16  0938  By signing my name below, I, [place], attest that this documentation has been prepared under the direction and in the presence of Lorin Glass, PA-C. Electronically Signed: Ethelle Lyon Long, Scribe. 12/03/2016. 12:30 PM.  History   Chief Complaint Chief Complaint  Patient presents with  . Back Pain   The history is provided by the patient and medical records. No language interpreter was used.    HPI Comments:  [name] is a 83 y.o. female with a PMHx of HTN, Shingles, Arthritis, CVA, GERD, Anxiety, and CA, who presents to the Emergency Department complaining of chronic, radiating lower back pain onset two days ago. Pt reports lifting a heavy plantar four days ago with her radiating left lumbar back pain arising two days ago and gradually worsening since that time. She has a h/o prior lumbar back surgery and a pinched nerve on the left side. Today, she feels a "pins and needles" feeling all down her left leg that is new. She states she has been recommended surgical intervention of the nerve in the past but has declined. No recent injuries or falls stated. Pt has an associated symptom of left buttock/leg pain. No h/o Renal Calculi. She notes she takes Tramadol at home for her arthritis with no relief of her back pain. She states lying supine exacerbates her pain. Pt denies incontinence of bladder/bowel, saddle anesthesia, flank pain, left leg numbness outside of her baseline, and any other complaints at this time. She has previously been referred to pain management, however refuses to go. She is currently being treated for a dental infection with hyperbaric oxygen therapy.  Past Medical History:  Diagnosis Date  . Anxiety   . Arthritis    osteo  . Back pain   . Cancer ([place])    squamas cell on scalp--took 14 radiation tx--2013  . Complication of anesthesia    resp distress -on vent  after surgery  . GERD (gastroesophageal reflux disease)   . Headache    out grew them  . Hypertension   . Intestinal obstruction ([place])   . Macular degeneration of left eye    dx over 55 yrs ago.....hasn't changed much  . Shingles   . Stroke [facility])    Mini stroke at age 76 years old. No lasting deficits   Patient Active Problem List   Diagnosis Date Noted  . Lumbar herniated disc 08/09/2014  . UTI (lower urinary tract infection) 04/08/2012  . Pneumonia 04/04/2012  . Fever 04/03/2012  . Acute blood loss anemia 04/01/2012  . Fall at home 03/31/2012  . History of shingles 03/31/2012  . Anemia 03/31/2012  . Femur fracture, left ([place]) 03/31/2012  . Hypertension 03/30/2012   Past Surgical History:  Procedure Laterality Date  . ABDOMINAL HYSTERECTOMY    . APPENDECTOMY    . CHOLECYSTECTOMY    . COLONOSCOPY    . EYE SURGERY    . knee replacement surgery     bilateral  . LUMBAR LAMINECTOMY/DECOMPRESSION MICRODISCECTOMY Right 08/09/2014   Procedure: Right Lumbar Four to Five Microdiskectomy;  Surgeon: Floyce Stakes, MD;  Location: [facility];  Service: Neurosurgery;  Laterality: Right;  Right L4-5 Microdiskectomy  . ORIF PERIPROSTHETIC FRACTURE  03/31/2012   Procedure: OPEN REDUCTION INTERNAL FIXATION (ORIF) PERIPROSTHETIC FRACTURE;  Surgeon: Mauri Pole, MD;  Location: [facility] ORS;  Service: Orthopedics;  Laterality: Left;  Open reduction internal fixation Left  distal femur periprosthetic fracture  . SMALL INTESTINE SURGERY  2011   went into respiratory distress and was on ventilator after surgery   OB History    No data available     Home Medications    Prior to Admission medications   Medication Sig Start Date End Date Taking? Authorizing Provider  amLODipine (NORVASC) 5 MG tablet Take 5 mg by mouth daily.    Historical Provider, MD  atorvastatin (LIPITOR) 40 MG tablet Take 40 mg by mouth daily.    Historical Provider, MD  calcium carbonate (TUMS EX) 750 MG chewable tablet  Chew 1 tablet by mouth as needed for heartburn.    Historical Provider, MD  carvedilol (COREG) 25 MG tablet Take 25 mg by mouth 2 (two) times daily with a meal.    Historical Provider, MD  cyclobenzaprine (FLEXERIL) 10 MG tablet Take 1 tablet (10 mg total) by mouth 3 (three) times daily as needed for muscle spasms. 10/21/14   Leonard Schwartz, MD  doxazosin (CARDURA) 8 MG tablet Take 8 mg by mouth daily.     Historical Provider, MD  feeding supplement (ENSURE COMPLETE) LIQD Take 237 mLs by mouth 3 (three) times daily between meals. 04/08/12   Velvet Bathe, MD  gabapentin (NEURONTIN) 100 MG capsule Take 2 capsules (200 mg total) by mouth 3 (three) times daily. 10/28/15   Davonna Belling, MD  losartan-hydrochlorothiazide (HYZAAR) 100-25 MG per tablet Take 1 tablet by mouth daily.    Historical Provider, MD  oxyCODONE-acetaminophen (PERCOCET/ROXICET) 5-325 MG tablet Take 1-2 tablets by mouth every 6 (six) hours as needed for severe pain. 10/28/15   Davonna Belling, MD  pantoprazole (PROTONIX) 40 MG tablet Take 40 mg by mouth daily as needed (GERD/cough).    Historical Provider, MD  temazepam (RESTORIL) 30 MG capsule Take 30 mg by mouth at bedtime. Sleep    Historical Provider, MD  Tetrahydrozoline HCl (VISINE OP) Place 1 drop into both eyes daily as needed (dry eyes).    Historical Provider, MD  traMADol-acetaminophen (ULTRACET) 37.5-325 MG per tablet Take 2 tablets by mouth 4 (four) times daily as needed (pain).    Historical Provider, MD   Family History No family history on file.  Social History Social History  Substance Use Topics  . Smoking status: Former Smoker    Years: 10.00    Quit date: 03/31/1991  . Smokeless tobacco: Never Used  . Alcohol use No   Allergies   Sulfa antibiotics   Review of Systems Review of Systems  Constitutional: Negative for chills and fever.  HENT: Positive for dental problem (Not new). Negative for ear pain and sore throat.   Eyes: Negative for pain and visual  disturbance.  Respiratory: Negative for cough and shortness of breath.   Cardiovascular: Negative for chest pain and palpitations.  Gastrointestinal: Negative for abdominal pain and vomiting.  Genitourinary: Negative for dysuria, flank pain and hematuria.       Neg incontinence of bladder/bowel   Musculoskeletal: Positive for back pain and myalgias. Negative for arthralgias.  Skin: Negative for color change and rash.  Neurological: Negative for seizures, syncope, numbness and headaches.  All other systems reviewed and are negative.   Physical Exam Updated Vital Signs BP 130/80 (BP Location: Left Arm)   Pulse 87   Temp 97.2 F (36.2 C) (Oral)   Resp 18   SpO2 98%   Physical Exam  Constitutional: She is oriented to person, place, and time. She appears well-developed and well-nourished.  HENT:  Head: Normocephalic.  Eyes: Conjunctivae are normal.  Cardiovascular: Normal rate.   Pulmonary/Chest: Effort normal.  Abdominal: She exhibits no distension.  Musculoskeletal: Normal range of motion. She exhibits tenderness.  Tenderness over her left buttock/ low back.   Neurological: She is alert and oriented to person, place, and time.  Skin: Skin is warm and dry.  Psychiatric: She has a normal mood and affect.  Nursing note and vitals reviewed.    ED Treatments / Results  DIAGNOSTIC STUDIES:  Oxygen Saturation is 97% on RA, normal by my interpretation.    COORDINATION OF CARE:  12:30 PM Discussed treatment plan with pt at bedside including L-Spine XR and Hydrocodone and pt agreed to plan.  2:55 PM Consult completed with neurology, appointment tomorrow at 3:15PM.   Labs (all labs ordered are listed, but only abnormal results are displayed) Labs Reviewed - No data to display  EKG  EKG Interpretation None       Radiology Dg Lumbar Spine Complete  Result Date: 12/03/2016 CLINICAL DATA:  Increased back pain radiating into the left leg after lifting heavy plantar 2 days  ago. The patient is unable to lie supine for imaging and [place] images were obtained standing. History of previous back surgery. EXAM: LUMBAR SPINE - COMPLETE 4+ VIEW COMPARISON:  Lumbar spine series of November 16, 2015 and MRI of the lumbar spine of July 05, 2016 FINDINGS: There is chronic curvature convex toward the left centered at L3-4 which appears stable allowing for the standing positioning. The vertebral bodies are preserved in height. There is moderate disc space narrowing at all lumbar levels. There is no significant spondylolisthesis. Endplate osteophytes are present at multiple levels. There is facet joint hypertrophy at L5-S1. The observed portions of the sacrum are normal. Is calcification in the wall of the abdominal aorta and iliac arteries. IMPRESSION: Moderate to severe disc space height loss at all lumbar levels. No acute compression fracture. Stable mild to moderate levocurvature centered at L3-4. If the patient's symptoms persist, repeat lumbar spine MRI would be useful. Electronically Signed   By: David  Martinique M.D.   On: 12/03/2016 14:42    Procedures Procedures   Medications Ordered in ED Medications  lidocaine (LIDODERM) 5 % 1 patch (1 patch Transdermal Patch Applied 12/03/16 1340)  HYDROcodone-acetaminophen (NORCO/VICODIN) 5-325 MG per tablet 1 tablet (1 tablet Oral Given 12/03/16 1153)  HYDROcodone-acetaminophen (NORCO/VICODIN) 5-325 MG per tablet 1 tablet (1 tablet Oral Given 12/03/16 1325)     Initial Impression / Assessment and Plan / ED Course  I have reviewed the triage vital signs and the nursing notes.  Pertinent labs & imaging results that were available during my care of the patient were reviewed by me and considered in my medical decision making (see chart for details).   Patient with back pain.  No neurological deficits and normal neuro exam.   No loss of bowel or bladder control.  No concern for cauda equina.  No fever, night sweats, weight loss, h/o IVDA, no recent  procedure to back. No urinary symptoms suggestive of UTI.   Based on patients age, history of back problems and pain she was given lumbar x-rays which showed no acute changes.  She required two doses of Norco 5 and a lidocaine patch to control her pain for lumbar x-rays.      Dr. Thomasene Lot had extensive conversation with patient and her family.  Dr. Thomasene Lot spoke with patients neurosurgeon who recommended follow up tomorrow in their office.  Supportive care  and return precaution discussed. Appears safe for discharge at this time. Follow up as indicated in discharge paperwork.   Final Clinical Impressions(s) / ED Diagnoses   Final diagnoses:  Sciatica of left side    New Prescriptions Discharge Medication List as of 12/03/2016  3:52 PM      I personally performed the services described in this documentation, which was scribed in my presence. The recorded information has been reviewed and is accurate.     Lorin Glass, PA-C 12/03/16 [place], MD 12/03/16 [phone]

## 2016-12-04 DIAGNOSIS — S3992XD Unspecified injury of lower back, subsequent encounter: Secondary | ICD-10-CM | POA: Diagnosis not present

## 2016-12-04 DIAGNOSIS — R030 Elevated blood-pressure reading, without diagnosis of hypertension: Secondary | ICD-10-CM | POA: Diagnosis not present

## 2016-12-05 DIAGNOSIS — M5410 Radiculopathy, site unspecified: Secondary | ICD-10-CM | POA: Diagnosis not present

## 2016-12-05 DIAGNOSIS — M545 Low back pain: Secondary | ICD-10-CM | POA: Diagnosis not present

## 2016-12-05 DIAGNOSIS — T6591XA Toxic effect of unspecified substance, accidental (unintentional), initial encounter: Secondary | ICD-10-CM | POA: Diagnosis not present

## 2016-12-05 DIAGNOSIS — G8929 Other chronic pain: Secondary | ICD-10-CM | POA: Diagnosis not present

## 2016-12-05 DIAGNOSIS — N390 Urinary tract infection, site not specified: Secondary | ICD-10-CM | POA: Diagnosis not present

## 2016-12-05 DIAGNOSIS — M5416 Radiculopathy, lumbar region: Secondary | ICD-10-CM | POA: Diagnosis not present

## 2016-12-05 DIAGNOSIS — T4271XA Poisoning by unspecified antiepileptic and sedative-hypnotic drugs, accidental (unintentional), initial encounter: Secondary | ICD-10-CM | POA: Diagnosis not present

## 2016-12-08 ENCOUNTER — Inpatient Hospital Stay (HOSPITAL_COMMUNITY)
Admission: EM | Admit: 2016-12-08 | Discharge: 2016-12-11 | DRG: 872 | Disposition: A | Discharge status: Home Health | Payer: Medicare Other | Attending: Family Medicine | Admitting: Family Medicine

## 2016-12-08 ENCOUNTER — Emergency Department (HOSPITAL_COMMUNITY): Payer: Medicare Other

## 2016-12-08 DIAGNOSIS — D509 Iron deficiency anemia, unspecified: Secondary | ICD-10-CM | POA: Diagnosis not present

## 2016-12-08 DIAGNOSIS — E118 Type 2 diabetes mellitus with unspecified complications: Secondary | ICD-10-CM | POA: Diagnosis not present

## 2016-12-08 DIAGNOSIS — Z79899 Other long term (current) drug therapy: Secondary | ICD-10-CM

## 2016-12-08 DIAGNOSIS — Z9889 Other specified postprocedural states: Secondary | ICD-10-CM

## 2016-12-08 DIAGNOSIS — E1169 Type 2 diabetes mellitus with other specified complication: Secondary | ICD-10-CM | POA: Diagnosis not present

## 2016-12-08 DIAGNOSIS — A419 Sepsis, unspecified organism: Secondary | ICD-10-CM | POA: Diagnosis not present

## 2016-12-08 DIAGNOSIS — M869 Osteomyelitis, unspecified: Secondary | ICD-10-CM | POA: Diagnosis not present

## 2016-12-08 DIAGNOSIS — E1165 Type 2 diabetes mellitus with hyperglycemia: Secondary | ICD-10-CM | POA: Diagnosis not present

## 2016-12-08 DIAGNOSIS — M545 Low back pain, unspecified: Secondary | ICD-10-CM

## 2016-12-08 DIAGNOSIS — E876 Hypokalemia: Secondary | ICD-10-CM | POA: Diagnosis not present

## 2016-12-08 DIAGNOSIS — M5126 Other intervertebral disc displacement, lumbar region: Secondary | ICD-10-CM | POA: Diagnosis present

## 2016-12-08 DIAGNOSIS — K219 Gastro-esophageal reflux disease without esophagitis: Secondary | ICD-10-CM | POA: Diagnosis not present

## 2016-12-08 DIAGNOSIS — M272 Inflammatory conditions of jaws: Secondary | ICD-10-CM | POA: Diagnosis not present

## 2016-12-08 DIAGNOSIS — N189 Chronic kidney disease, unspecified: Secondary | ICD-10-CM | POA: Diagnosis present

## 2016-12-08 DIAGNOSIS — R509 Fever, unspecified: Secondary | ICD-10-CM

## 2016-12-08 DIAGNOSIS — I129 Hypertensive chronic kidney disease with stage 1 through stage 4 chronic kidney disease, or unspecified chronic kidney disease: Secondary | ICD-10-CM | POA: Diagnosis present

## 2016-12-08 DIAGNOSIS — K047 Periapical abscess without sinus: Secondary | ICD-10-CM | POA: Diagnosis present

## 2016-12-08 DIAGNOSIS — Z7984 Long term (current) use of oral hypoglycemic drugs: Secondary | ICD-10-CM

## 2016-12-08 DIAGNOSIS — D631 Anemia in chronic kidney disease: Secondary | ICD-10-CM | POA: Diagnosis present

## 2016-12-08 DIAGNOSIS — E1122 Type 2 diabetes mellitus with diabetic chronic kidney disease: Secondary | ICD-10-CM | POA: Diagnosis present

## 2016-12-08 DIAGNOSIS — N183 Chronic kidney disease, stage 3 unspecified: Secondary | ICD-10-CM | POA: Diagnosis present

## 2016-12-08 DIAGNOSIS — R531 Weakness: Secondary | ICD-10-CM | POA: Diagnosis not present

## 2016-12-08 DIAGNOSIS — R410 Disorientation, unspecified: Secondary | ICD-10-CM

## 2016-12-08 DIAGNOSIS — Z8673 Personal history of transient ischemic attack (TIA), and cerebral infarction without residual deficits: Secondary | ICD-10-CM | POA: Diagnosis not present

## 2016-12-08 DIAGNOSIS — Z96653 Presence of artificial knee joint, bilateral: Secondary | ICD-10-CM | POA: Diagnosis not present

## 2016-12-08 DIAGNOSIS — G8929 Other chronic pain: Secondary | ICD-10-CM | POA: Diagnosis not present

## 2016-12-08 DIAGNOSIS — I517 Cardiomegaly: Secondary | ICD-10-CM | POA: Diagnosis not present

## 2016-12-08 DIAGNOSIS — E860 Dehydration: Secondary | ICD-10-CM | POA: Diagnosis present

## 2016-12-08 DIAGNOSIS — IMO0002 Reserved for concepts with insufficient information to code with codable children: Secondary | ICD-10-CM | POA: Diagnosis present

## 2016-12-08 DIAGNOSIS — I10 Essential (primary) hypertension: Secondary | ICD-10-CM | POA: Diagnosis present

## 2016-12-08 DIAGNOSIS — E785 Hyperlipidemia, unspecified: Secondary | ICD-10-CM | POA: Diagnosis present

## 2016-12-08 DIAGNOSIS — M549 Dorsalgia, unspecified: Secondary | ICD-10-CM | POA: Diagnosis present

## 2016-12-08 DIAGNOSIS — R404 Transient alteration of awareness: Secondary | ICD-10-CM | POA: Diagnosis not present

## 2016-12-08 DIAGNOSIS — J9811 Atelectasis: Secondary | ICD-10-CM | POA: Diagnosis not present

## 2016-12-08 DIAGNOSIS — D649 Anemia, unspecified: Secondary | ICD-10-CM | POA: Diagnosis present

## 2016-12-08 HISTORY — DX: Low back pain, unspecified: M54.50

## 2016-12-08 HISTORY — DX: Other chronic pain: G89.29

## 2016-12-08 HISTORY — DX: Major depressive disorder, single episode, unspecified: F32.9

## 2016-12-08 HISTORY — DX: Depression, unspecified: F32.A

## 2016-12-08 HISTORY — DX: Reserved for concepts with insufficient information to code with codable children: IMO0002

## 2016-12-08 HISTORY — DX: Esophageal obstruction: K22.2

## 2016-12-08 HISTORY — DX: Type 2 diabetes mellitus without complications: E11.9

## 2016-12-08 HISTORY — DX: Low back pain: M54.5

## 2016-12-08 HISTORY — DX: Personal history of other medical treatment: Z92.89

## 2016-12-08 HISTORY — DX: Pure hypercholesterolemia, unspecified: E78.00

## 2016-12-08 MED ORDER — SODIUM CHLORIDE 0.9 % IV BOLUS (SEPSIS)
1000.0000 mL | Freq: Once | INTRAVENOUS | Status: AC
  Administered 2016-12-08: 1000 mL via INTRAVENOUS

## 2016-12-08 MED ORDER — ACETAMINOPHEN 650 MG RE SUPP
650.0000 mg | Freq: Once | RECTAL | Status: AC
  Administered 2016-12-09: 650 mg via RECTAL
  Filled 2016-12-08: qty 1

## 2016-12-08 MED ORDER — VANCOMYCIN HCL IN DEXTROSE 1-5 GM/200ML-% IV SOLN
1000.0000 mg | Freq: Once | INTRAVENOUS | Status: AC
  Administered 2016-12-08: 1000 mg via INTRAVENOUS
  Filled 2016-12-08: qty 200

## 2016-12-08 MED ORDER — SODIUM CHLORIDE 0.9 % IV BOLUS (SEPSIS)
250.0000 mL | Freq: Once | INTRAVENOUS | Status: AC
  Administered 2016-12-08: 250 mL via INTRAVENOUS

## 2016-12-08 MED ORDER — PIPERACILLIN-TAZOBACTAM 3.375 G IVPB 30 MIN
3.3750 g | Freq: Once | INTRAVENOUS | Status: AC
  Administered 2016-12-08: 3.375 g via INTRAVENOUS
  Filled 2016-12-08: qty 50

## 2016-12-08 NOTE — ED Triage Notes (Signed)
Pt BIB ems from home for lethargy, confusion and recent UTI. Per EMS pt tx at [facility] on Wednesday for UTI and jaw infection; states pt has not been taking abx as prescribed.  Pt moaning not answering questions when asked; follows commands; no facial or arm droop on assessment. Pt resp e/u; sats 91%, placed on 2 L [place].

## 2016-12-08 NOTE — ED Provider Notes (Signed)
[facility] DEPT Provider Note   CSN: [id number] Arrival date & time: 12/08/16  2241     History   Chief Complaint Chief Complaint  Patient presents with  . Altered Mental Status  . Urinary Tract Infection   LEVEL 5 CAVEAT DUE TO ALTERED MENTAL STATUS  HPI LINDI ABRAM is a 83 y.o. female.  The history is provided by a relative. The history is limited by the condition of the patient.  Altered Mental Status   This is a new problem. The current episode started more than 2 days ago. The problem has been gradually worsening. Associated symptoms include confusion, somnolence, weakness, delusions and hallucinations.  Urinary Tract Infection    Patient presents from home for altered mental status Over a week ago, she had worsening low back pain Seen in the ED here and also at [facility] and was diagnosed with UTI and started keflex.  She also underwent CT scan of low back and told she has "pinched nerve" Over the past 3 days her mental status is worsening She keeps eyes closed and moans She also appears to be hallucinating per family She has appeared to have had fever No abrupt change in medications prior to change in mental status She has had recent falls No urinary/fecal incontinence reported.  She also has nonhealing dental infection and is supposed to receive hyperbaric O2 therapy for this but due to recent illness unable to attend those sessions  Past Medical History:  Diagnosis Date  . Anxiety   . Arthritis    osteo  . Back pain   . Cancer ([place])    squamas cell on scalp--took 14 radiation tx--2013  . Complication of anesthesia    resp distress -on vent after surgery  . GERD (gastroesophageal reflux disease)   . Headache    out grew them  . Hypertension   . Intestinal obstruction ([place])   . Macular degeneration of left eye    dx over 55 yrs ago.....hasn't changed much  . Shingles   . Stroke [facility])    Mini stroke at age 81 years old. No lasting deficits     Patient Active Problem List   Diagnosis Date Noted  . Lumbar herniated disc 08/09/2014  . UTI (lower urinary tract infection) 04/08/2012  . Pneumonia 04/04/2012  . Fever 04/03/2012  . Acute blood loss anemia 04/01/2012  . Fall at home 03/31/2012  . History of shingles 03/31/2012  . Anemia 03/31/2012  . Femur fracture, left ([place]) 03/31/2012  . Hypertension 03/30/2012    Past Surgical History:  Procedure Laterality Date  . ABDOMINAL HYSTERECTOMY    . APPENDECTOMY    . CHOLECYSTECTOMY    . COLONOSCOPY    . EYE SURGERY    . knee replacement surgery     bilateral  . LUMBAR LAMINECTOMY/DECOMPRESSION MICRODISCECTOMY Right 08/09/2014   Procedure: Right Lumbar Four to Five Microdiskectomy;  Surgeon: Floyce Stakes, MD;  Location: [facility] NEUR[facility]S;  Service: Neurosurgery;  Laterality: Right;  Right L4-5 Microdiskectomy  . ORIF PERIPROSTHETIC FRACTURE  03/31/2012   Procedure: OPEN REDUCTION INTERNAL FIXATION (ORIF) PERIPROSTHETIC FRACTURE;  Surgeon: Mauri Pole, MD;  Location: [facility] ORS;  Service: Orthopedics;  Laterality: Left;  Open reduction internal fixation Left distal femur periprosthetic fracture  . SMALL INTESTINE SURGERY  2011   went into respiratory distress and was on ventilator after surgery    OB History    No data available       Home Medications  Prior to Admission medications   Medication Sig Start Date End Date Taking? Authorizing Provider  amLODipine (NORVASC) 5 MG tablet Take 5 mg by mouth daily.    [provider]  atorvastatin (LIPITOR) 40 MG tablet Take 40 mg by mouth daily.    [provider]  calcium carbonate (TUMS EX) 750 MG chewable tablet Chew 1 tablet by mouth as needed for heartburn.    [provider]  carvedilol (COREG) 25 MG tablet Take 25 mg by mouth 2 (two) times daily with a meal.    [provider]  cyclobenzaprine (FLEXERIL) 10 MG tablet Take 1 tablet (10 mg total) by mouth 3 (three) times daily as needed  for muscle spasms. 10/21/14   Leonard Schwartz, MD  doxazosin (CARDURA) 8 MG tablet Take 8 mg by mouth daily.     [provider]  feeding supplement (ENSURE COMPLETE) LIQD Take 237 mLs by mouth 3 (three) times daily between meals. 04/08/12   Velvet Bathe, MD  gabapentin (NEURONTIN) 100 MG capsule Take 2 capsules (200 mg total) by mouth 3 (three) times daily. 10/28/15   Davonna Belling, MD  losartan-hydrochlorothiazide (HYZAAR) 100-25 MG per tablet Take 1 tablet by mouth daily.    [provider]  oxyCODONE-acetaminophen (PERCOCET/ROXICET) 5-325 MG tablet Take 1-2 tablets by mouth every 6 (six) hours as needed for severe pain. 10/28/15   Davonna Belling, MD  pantoprazole (PROTONIX) 40 MG tablet Take 40 mg by mouth daily as needed (GERD/cough).    [provider]  temazepam (RESTORIL) 30 MG capsule Take 30 mg by mouth at bedtime. Sleep    [provider]  Tetrahydrozoline HCl (VISINE OP) Place 1 drop into both eyes daily as needed (dry eyes).    [provider]  traMADol-acetaminophen (ULTRACET) 37.5-325 MG per tablet Take 2 tablets by mouth 4 (four) times daily as needed (pain).    [provider]    Family History No family history on file.  Social History Social History  Substance Use Topics  . Smoking status: Former Smoker    Years: 10.00    Quit date: 03/31/1991  . Smokeless tobacco: Never Used  . Alcohol use No     Allergies   Sulfa antibiotics   Review of Systems Review of Systems  Unable to perform ROS: Mental status change  Neurological: Positive for weakness.  Psychiatric/Behavioral: Positive for confusion and hallucinations.     Physical Exam Updated Vital Signs BP (!) 145/63   Pulse (!) 110   Temp 100.2 F (37.9 C) (Rectal)   Resp 18   SpO2 94%   Physical Exam CONSTITUTIONAL: Elderly, frail, appears ill, moaning in pain HEAD: Normocephalic/atraumatic EYES: EOMI/PERRL ENMT: Mucous membranes dry.  Poor  dentition.  No trismus.  Floor of mouth soft.  No stridor.  No drooling NECK: supple no meningeal signs, no induration to anterior neck.   SPINE/BACK:well healed scar to lumbar spine.  No bruising/crepitance/stepoffs noted to spine She is diffusely tender to palpation throughout spine CV: S1/S2 noted, no murmurs/rubs/gallops noted LUNGS: crackles bilaterally ABDOMEN: soft, nontender NEURO: Pt is resting with eyes closed.  She will follow commands but does not speak.  She moves all extremities x4.  She moans throughout exam.   EXTREMITIES: pulses normal/equal, full ROM SKIN: warm, color normal PSYCH: unable to assess   ED Treatments / Results  Labs (all labs ordered are listed, but only abnormal results are displayed) Labs Reviewed  COMPREHENSIVE METABOLIC PANEL - Abnormal; Notable for the following:  Result Value   Chloride 97 (*)    Glucose, Bld 244 (*)    BUN 33 (*)    Creatinine, Ser 1.14 (*)    Total Protein 8.4 (*)    GFR calc non Af Amer 43 (*)    GFR calc Af Amer 50 (*)    All other components within normal limits  CBC WITH DIFFERENTIAL/PLATELET - Abnormal; Notable for the following:    WBC 18.0 (*)    Platelets 402 (*)    Neutro Abs 13.4 (*)    Monocytes Absolute 1.8 (*)    All other components within normal limits  SEDIMENTATION RATE - Abnormal; Notable for the following:    Sed Rate 72 (*)    All other components within normal limits  ACETAMINOPHEN LEVEL - Abnormal; Notable for the following:    Acetaminophen (Tylenol), Serum <10 (*)    All other components within normal limits  CULTURE, BLOOD (ROUTINE X 2)  CULTURE, BLOOD (ROUTINE X 2)  URINE CULTURE  URINALYSIS, ROUTINE W REFLEX MICROSCOPIC  LIPASE, BLOOD  SALICYLATE LEVEL  C-REACTIVE PROTEIN  I-STAT CG4 LACTIC ACID, ED  CBG MONITORING, ED    EKG ED ECG REPORT   Date: 12/08/2016 2246  Rate: 110  Rhythm: sinus tachycardia  QRS Axis: normal  Intervals: normal  ST/T Wave abnormalities: normal   Conduction Disutrbances:none   I have personally reviewed the EKG tracing and agree with the computerized printout as noted.  Radiology Dg Chest Port 1 View  Result Date: 12/08/2016 CLINICAL DATA:  Acute onset of lethargy and confusion. Recent urinary tract infection. Initial encounter. EXAM: PORTABLE CHEST 1 VIEW COMPARISON:  Chest radiograph performed 11/13/2016 FINDINGS: The lungs are well-aerated. Mild bibasilar atelectasis or scarring is noted. Peribronchial thickening is seen. There is no evidence of pleural effusion or pneumothorax. The cardiomediastinal silhouette is borderline enlarged. No acute osseous abnormalities are seen. IMPRESSION: Mild bibasilar atelectasis or scarring noted. Peribronchial thickening seen. Borderline cardiomegaly. Electronically Signed   By: Garald Balding M.D.   On: 12/08/2016 23:38    Procedures Procedures (including critical care time)  Medications Ordered in ED Medications  sodium chloride 0.9 % bolus 1,000 mL (1,000 mLs Intravenous New Bag/Given 12/08/16 2357)    And  sodium chloride 0.9 % bolus 1,000 mL (1,000 mLs Intravenous New Bag/Given 12/08/16 2358)    And  sodium chloride 0.9 % bolus 250 mL (0 mLs Intravenous Stopped 12/09/16 0113)  piperacillin-tazobactam (ZOSYN) IVPB 3.375 g (0 g Intravenous Stopped 12/09/16 0105)  vancomycin (VANCOCIN) IVPB 1000 mg/200 mL premix (0 mg Intravenous Stopped 12/09/16 0114)  acetaminophen (TYLENOL) suppository 650 mg (650 mg Rectal Given 12/09/16 0014)     Initial Impression / Assessment and Plan / ED Course  I have reviewed the triage vital signs and the nursing notes.  Pertinent labs & imaging results that were available during my care of the patient were reviewed by me and considered in my medical decision making (see chart for details).     11:46 PM Pt with acute worsening back pain about a week ago, and starting several days ago has had delirium, now with fever Labs/imaging ordered at this time Code sepsis  ordered 1:23 AM Pt improved BP 126/76   Pulse (!) 116   Temp 100.2 F (37.9 C) (Rectal)   Resp (!) 22   SpO2 98%  She is awake/alert No meningeal signs, denies HA No abd tenderness Denies back pain No focal back tenderness Unclear cause of fever (u/a is negative  and only partially treated UTI) I did have radiology review CT lumbar on 5/3 at [facility] and no signs of infectious etiology She may still require MRI while inpatient of lumbar spine 1:43 AM D/w dr Blaine Hamper with triad Will admit for monitoring  Final Clinical Impressions(s) / ED Diagnoses   Final diagnoses:  Acute febrile illness  Dehydration  Delirium    New Prescriptions New Prescriptions   No medications on file     Ripley Fraise, MD 12/09/16 0144

## 2016-12-08 NOTE — ED Notes (Signed)
ED Provider at bedside.

## 2016-12-09 ENCOUNTER — Emergency Department (HOSPITAL_COMMUNITY): Payer: Medicare Other

## 2016-12-09 ENCOUNTER — Encounter (HOSPITAL_COMMUNITY): Payer: Self-pay | Admitting: General Practice

## 2016-12-09 DIAGNOSIS — F32A Depression, unspecified: Secondary | ICD-10-CM | POA: Insufficient documentation

## 2016-12-09 DIAGNOSIS — A419 Sepsis, unspecified organism: Secondary | ICD-10-CM | POA: Diagnosis not present

## 2016-12-09 DIAGNOSIS — N183 Chronic kidney disease, stage 3 unspecified: Secondary | ICD-10-CM | POA: Diagnosis present

## 2016-12-09 DIAGNOSIS — R509 Fever, unspecified: Secondary | ICD-10-CM | POA: Diagnosis not present

## 2016-12-09 DIAGNOSIS — Z8673 Personal history of transient ischemic attack (TIA), and cerebral infarction without residual deficits: Secondary | ICD-10-CM

## 2016-12-09 DIAGNOSIS — E860 Dehydration: Secondary | ICD-10-CM | POA: Diagnosis present

## 2016-12-09 DIAGNOSIS — F329 Major depressive disorder, single episode, unspecified: Secondary | ICD-10-CM | POA: Insufficient documentation

## 2016-12-09 DIAGNOSIS — Z7984 Long term (current) use of oral hypoglycemic drugs: Secondary | ICD-10-CM | POA: Diagnosis not present

## 2016-12-09 DIAGNOSIS — E1122 Type 2 diabetes mellitus with diabetic chronic kidney disease: Secondary | ICD-10-CM | POA: Diagnosis present

## 2016-12-09 DIAGNOSIS — K219 Gastro-esophageal reflux disease without esophagitis: Secondary | ICD-10-CM | POA: Diagnosis present

## 2016-12-09 DIAGNOSIS — I10 Essential (primary) hypertension: Secondary | ICD-10-CM | POA: Diagnosis not present

## 2016-12-09 DIAGNOSIS — E785 Hyperlipidemia, unspecified: Secondary | ICD-10-CM | POA: Diagnosis present

## 2016-12-09 DIAGNOSIS — M5126 Other intervertebral disc displacement, lumbar region: Secondary | ICD-10-CM

## 2016-12-09 DIAGNOSIS — I129 Hypertensive chronic kidney disease with stage 1 through stage 4 chronic kidney disease, or unspecified chronic kidney disease: Secondary | ICD-10-CM | POA: Diagnosis present

## 2016-12-09 DIAGNOSIS — IMO0002 Reserved for concepts with insufficient information to code with codable children: Secondary | ICD-10-CM | POA: Diagnosis present

## 2016-12-09 DIAGNOSIS — Z96653 Presence of artificial knee joint, bilateral: Secondary | ICD-10-CM | POA: Diagnosis present

## 2016-12-09 DIAGNOSIS — D509 Iron deficiency anemia, unspecified: Secondary | ICD-10-CM | POA: Diagnosis not present

## 2016-12-09 DIAGNOSIS — E876 Hypokalemia: Secondary | ICD-10-CM | POA: Diagnosis present

## 2016-12-09 DIAGNOSIS — K047 Periapical abscess without sinus: Secondary | ICD-10-CM | POA: Diagnosis present

## 2016-12-09 DIAGNOSIS — E1165 Type 2 diabetes mellitus with hyperglycemia: Secondary | ICD-10-CM | POA: Diagnosis present

## 2016-12-09 DIAGNOSIS — E118 Type 2 diabetes mellitus with unspecified complications: Secondary | ICD-10-CM

## 2016-12-09 DIAGNOSIS — M549 Dorsalgia, unspecified: Secondary | ICD-10-CM | POA: Diagnosis present

## 2016-12-09 DIAGNOSIS — Z79899 Other long term (current) drug therapy: Secondary | ICD-10-CM | POA: Diagnosis not present

## 2016-12-09 DIAGNOSIS — M869 Osteomyelitis, unspecified: Secondary | ICD-10-CM | POA: Diagnosis present

## 2016-12-09 DIAGNOSIS — G8929 Other chronic pain: Secondary | ICD-10-CM | POA: Diagnosis present

## 2016-12-09 DIAGNOSIS — E1169 Type 2 diabetes mellitus with other specified complication: Secondary | ICD-10-CM | POA: Diagnosis present

## 2016-12-09 DIAGNOSIS — D649 Anemia, unspecified: Secondary | ICD-10-CM | POA: Diagnosis present

## 2016-12-09 DIAGNOSIS — Z9889 Other specified postprocedural states: Secondary | ICD-10-CM

## 2016-12-09 DIAGNOSIS — M545 Low back pain: Secondary | ICD-10-CM | POA: Diagnosis not present

## 2016-12-09 DIAGNOSIS — M272 Inflammatory conditions of jaws: Secondary | ICD-10-CM | POA: Diagnosis present

## 2016-12-09 DIAGNOSIS — F419 Anxiety disorder, unspecified: Secondary | ICD-10-CM | POA: Insufficient documentation

## 2016-12-09 DIAGNOSIS — M5136 Other intervertebral disc degeneration, lumbar region: Secondary | ICD-10-CM | POA: Diagnosis not present

## 2016-12-09 LAB — COMPREHENSIVE METABOLIC PANEL
ALBUMIN: 3.7 g/dL (ref 3.5–5.0)
ALK PHOS: 58 U/L (ref 38–126)
ALT: 17 U/L (ref 14–54)
ANION GAP: 14 (ref 5–15)
AST: 23 U/L (ref 15–41)
BILIRUBIN TOTAL: 0.5 mg/dL (ref 0.3–1.2)
BUN: 33 mg/dL — ABNORMAL HIGH (ref 6–20)
CALCIUM: 9.6 mg/dL (ref 8.9–10.3)
CO2: 25 mmol/L (ref 22–32)
Chloride: 97 mmol/L — ABNORMAL LOW (ref 101–111)
Creatinine, Ser: 1.14 mg/dL — ABNORMAL HIGH (ref 0.44–1.00)
GFR calc non Af Amer: 43 mL/min — ABNORMAL LOW (ref >60)
GFR, EST AFRICAN AMERICAN: 50 mL/min — AB (ref >60)
Glucose, Bld: 244 mg/dL — ABNORMAL HIGH (ref 65–99)
POTASSIUM: 3.7 mmol/L (ref 3.5–5.1)
SODIUM: 136 mmol/L (ref 135–145)
TOTAL PROTEIN: 8.4 g/dL — AB (ref 6.5–8.1)

## 2016-12-09 LAB — URINALYSIS, ROUTINE W REFLEX MICROSCOPIC
Bilirubin Urine: NEGATIVE
Glucose, UA: NEGATIVE mg/dL
Hgb urine dipstick: NEGATIVE
Ketones, ur: NEGATIVE mg/dL
Leukocytes, UA: NEGATIVE
NITRITE: NEGATIVE
PH: 6 (ref 5.0–8.0)
Protein, ur: NEGATIVE mg/dL
SPECIFIC GRAVITY, URINE: 1.02 (ref 1.005–1.030)

## 2016-12-09 LAB — CBC WITH DIFFERENTIAL/PLATELET
BASOS ABS: 0.1 10*3/uL (ref 0.0–0.1)
BASOS PCT: 0 %
Eosinophils Absolute: 0.1 10*3/uL (ref 0.0–0.7)
Eosinophils Relative: 0 %
HEMATOCRIT: 39.7 % (ref 36.0–46.0)
HEMOGLOBIN: 12.8 g/dL (ref 12.0–15.0)
Lymphocytes Relative: 15 %
Lymphs Abs: 2.8 10*3/uL (ref 0.7–4.0)
MCH: 29.8 pg (ref 26.0–34.0)
MCHC: 32.2 g/dL (ref 30.0–36.0)
MCV: 92.3 fL (ref 78.0–100.0)
Monocytes Absolute: 1.8 10*3/uL — ABNORMAL HIGH (ref 0.1–1.0)
Monocytes Relative: 10 %
NEUTROS ABS: 13.4 10*3/uL — AB (ref 1.7–7.7)
NEUTROS PCT: 74 %
Platelets: 402 10*3/uL — ABNORMAL HIGH (ref 150–400)
RBC: 4.3 MIL/uL (ref 3.87–5.11)
RDW: 13.7 % (ref 11.5–15.5)
WBC: 18 10*3/uL — ABNORMAL HIGH (ref 4.0–10.5)

## 2016-12-09 LAB — PHOSPHORUS: PHOSPHORUS: 2.3 mg/dL — AB (ref 2.5–4.6)

## 2016-12-09 LAB — PROCALCITONIN: Procalcitonin: <0.1 ng/mL

## 2016-12-09 LAB — GLUCOSE, CAPILLARY
GLUCOSE-CAPILLARY: 144 mg/dL — AB (ref 65–99)
GLUCOSE-CAPILLARY: 150 mg/dL — AB (ref 65–99)
GLUCOSE-CAPILLARY: 131 mg/dL — AB (ref 65–99)
Glucose-Capillary: 116 mg/dL — ABNORMAL HIGH (ref 65–99)

## 2016-12-09 LAB — MAGNESIUM: Magnesium: 1.6 mg/dL — ABNORMAL LOW (ref 1.7–2.4)

## 2016-12-09 LAB — PROTIME-INR
INR: 1.18
Prothrombin Time: 15.1 seconds (ref 11.4–15.2)

## 2016-12-09 LAB — ACETAMINOPHEN LEVEL

## 2016-12-09 LAB — I-STAT CG4 LACTIC ACID, ED
LACTIC ACID, VENOUS: 2.21 mmol/L — AB (ref 0.5–1.9)
Lactic Acid, Venous: 1.76 mmol/L (ref 0.5–1.9)

## 2016-12-09 LAB — CBG MONITORING, ED
Glucose-Capillary: 206 mg/dL — ABNORMAL HIGH (ref 65–99)
Glucose-Capillary: 196 mg/dL — ABNORMAL HIGH (ref 65–99)

## 2016-12-09 LAB — LIPASE, BLOOD: Lipase: 42 U/L (ref 11–51)

## 2016-12-09 LAB — APTT: aPTT: 28 seconds (ref 24–36)

## 2016-12-09 LAB — SALICYLATE LEVEL: Salicylate Lvl: <7 mg/dL (ref 2.8–30.0)

## 2016-12-09 LAB — C-REACTIVE PROTEIN: CRP: 5.6 mg/dL — AB (ref <1.0)

## 2016-12-09 LAB — SEDIMENTATION RATE: SED RATE: 72 mm/h — AB (ref 0–22)

## 2016-12-09 MED ORDER — MORPHINE SULFATE (PF) 4 MG/ML IV SOLN
1.0000 mg | INTRAVENOUS | Status: DC | PRN
  Administered 2016-12-09 – 2016-12-10 (×2): 4 mg via INTRAVENOUS
  Filled 2016-12-09 (×2): qty 1

## 2016-12-09 MED ORDER — GABAPENTIN 300 MG PO CAPS
300.0000 mg | ORAL_CAPSULE | Freq: Three times a day (TID) | ORAL | Status: DC
  Administered 2016-12-09 – 2016-12-11 (×5): 300 mg via ORAL
  Filled 2016-12-09 (×6): qty 1

## 2016-12-09 MED ORDER — ONDANSETRON HCL 4 MG/2ML IJ SOLN: 4.0000 mg | Freq: Four times a day (QID) | INTRAMUSCULAR | Status: DC | PRN

## 2016-12-09 MED ORDER — VANCOMYCIN HCL 500 MG IV SOLR
500.0000 mg | Freq: Two times a day (BID) | INTRAVENOUS | Status: DC
  Administered 2016-12-09 – 2016-12-11 (×4): 500 mg via INTRAVENOUS
  Filled 2016-12-09 (×7): qty 500

## 2016-12-09 MED ORDER — ATORVASTATIN CALCIUM 40 MG PO TABS
40.0000 mg | ORAL_TABLET | Freq: Every day | ORAL | Status: DC
Start: 2016-12-09 — End: 2016-12-11
  Administered 2016-12-10: 40 mg via ORAL
  Filled 2016-12-09 (×3): qty 1

## 2016-12-09 MED ORDER — ONDANSETRON HCL 4 MG PO TABS
4.0000 mg | ORAL_TABLET | Freq: Four times a day (QID) | ORAL | Status: DC | PRN
Start: 2016-12-09 — End: 2016-12-11

## 2016-12-09 MED ORDER — MIRTAZAPINE 30 MG PO TABS
30.0000 mg | ORAL_TABLET | Freq: Every day | ORAL | Status: DC
  Administered 2016-12-10: 30 mg via ORAL
  Filled 2016-12-09 (×2): qty 1

## 2016-12-09 MED ORDER — FAMOTIDINE 20 MG PO TABS
20.0000 mg | ORAL_TABLET | Freq: Every day | ORAL | Status: DC
  Administered 2016-12-09 – 2016-12-11 (×3): 20 mg via ORAL
  Filled 2016-12-09 (×3): qty 1

## 2016-12-09 MED ORDER — OXYCODONE-ACETAMINOPHEN 5-325 MG PO TABS
1.0000 | ORAL_TABLET | ORAL | Status: DC | PRN
  Administered 2016-12-10: 1 via ORAL
  Administered 2016-12-10: 2 via ORAL
  Administered 2016-12-10 – 2016-12-11 (×3): 1 via ORAL
  Filled 2016-12-09 (×2): qty 1
  Filled 2016-12-09: qty 2
  Filled 2016-12-09 (×2): qty 1

## 2016-12-09 MED ORDER — SODIUM CHLORIDE 0.9% FLUSH: 3.0000 mL | Freq: Two times a day (BID) | INTRAVENOUS | Status: DC

## 2016-12-09 MED ORDER — METHOCARBAMOL 1000 MG/10ML IJ SOLN
500.0000 mg | Freq: Four times a day (QID) | INTRAMUSCULAR | Status: DC | PRN
  Administered 2016-12-10 – 2016-12-11 (×2): 500 mg via INTRAVENOUS
  Filled 2016-12-09 (×3): qty 5

## 2016-12-09 MED ORDER — MAGNESIUM SULFATE 2 GM/50ML IV SOLN
2.0000 g | Freq: Once | INTRAVENOUS | Status: AC
  Administered 2016-12-09: 2 g via INTRAVENOUS
  Filled 2016-12-09: qty 50

## 2016-12-09 MED ORDER — ENOXAPARIN SODIUM 40 MG/0.4ML ~~LOC~~ SOLN
40.0000 mg | SUBCUTANEOUS | Status: DC
  Administered 2016-12-09 – 2016-12-11 (×3): 40 mg via SUBCUTANEOUS
  Filled 2016-12-09 (×3): qty 0.4

## 2016-12-09 MED ORDER — IOPAMIDOL (ISOVUE-300) INJECTION 61%
INTRAVENOUS | Status: AC
  Administered 2016-12-09: 60 mL
  Filled 2016-12-09: qty 75

## 2016-12-09 MED ORDER — LORAZEPAM 2 MG/ML IJ SOLN
1.0000 mg | Freq: Once | INTRAMUSCULAR | Status: AC
  Administered 2016-12-09: 1 mg via INTRAVENOUS
  Filled 2016-12-09: qty 1

## 2016-12-09 MED ORDER — PIPERACILLIN-TAZOBACTAM 3.375 G IVPB 30 MIN: 3.3750 g | Freq: Three times a day (TID) | INTRAVENOUS | Status: DC

## 2016-12-09 MED ORDER — FENTANYL CITRATE (PF) 100 MCG/2ML IJ SOLN
50.0000 ug | Freq: Once | INTRAMUSCULAR | Status: AC
  Administered 2016-12-09: 50 ug via INTRAVENOUS
  Filled 2016-12-09: qty 2

## 2016-12-09 MED ORDER — SODIUM CHLORIDE 0.9 % IV SOLN
INTRAVENOUS | Status: DC
  Administered 2016-12-09 – 2016-12-11 (×3): via INTRAVENOUS

## 2016-12-09 MED ORDER — CARVEDILOL 25 MG PO TABS
25.0000 mg | ORAL_TABLET | Freq: Two times a day (BID) | ORAL | Status: DC
Start: 2016-12-09 — End: 2016-12-11
  Administered 2016-12-09 – 2016-12-11 (×4): 25 mg via ORAL
  Filled 2016-12-09 (×5): qty 1

## 2016-12-09 MED ORDER — INSULIN ASPART 100 UNIT/ML ~~LOC~~ SOLN
0.0000 [IU] | SUBCUTANEOUS | Status: DC
  Administered 2016-12-09 (×3): 1 [IU] via SUBCUTANEOUS
  Administered 2016-12-09: 2 [IU] via SUBCUTANEOUS
  Administered 2016-12-10 (×2): 1 [IU] via SUBCUTANEOUS
  Administered 2016-12-10: 2 [IU] via SUBCUTANEOUS
  Administered 2016-12-10 – 2016-12-11 (×3): 1 [IU] via SUBCUTANEOUS
  Administered 2016-12-11: 2 [IU] via SUBCUTANEOUS
  Filled 2016-12-09: qty 1

## 2016-12-09 MED ORDER — PIPERACILLIN-TAZOBACTAM 3.375 G IVPB: 3.3750 g | Freq: Three times a day (TID) | INTRAVENOUS | Status: DC

## 2016-12-09 MED ORDER — PIPERACILLIN-TAZOBACTAM 3.375 G IVPB
3.3750 g | Freq: Three times a day (TID) | INTRAVENOUS | Status: DC
  Administered 2016-12-09 – 2016-12-11 (×7): 3.375 g via INTRAVENOUS
  Filled 2016-12-09 (×8): qty 50

## 2016-12-09 NOTE — ED Notes (Signed)
Patient transported to CT 

## 2016-12-09 NOTE — ED Notes (Signed)
Attempted 2nd PIV; no success. Phleb tech drawing 2nd set of cultures.

## 2016-12-09 NOTE — Progress Notes (Addendum)
Received call from outside physician --oral surgeon of pt's           Dr Johny Shears --she states pt has been seen by her recently and that pt has packing in the #31 socket area and that it needs to be removed. She states that she has osteomylitis of the mandible and packing needs to be removed.   Dr Johny Shears can be reached at his office at [phone] or by her cell # [phone] if more information or discussion needed.

## 2016-12-09 NOTE — ED Notes (Signed)
Nurse at bedside and will draw blood.

## 2016-12-09 NOTE — H&P (Signed)
History and Physical    [name] DGU:[id number] DOB: 1931/09/06 DOA: 12/08/2016   PCP: [name], PA-C   Patient coming from/Resides with: Private residence  Admission status: Inpatient/telemetry -medically necessary to stay a minimum 2 midnights to rule out impending and/or unexpected changes in physiologic status that may differ from initial evaluation performed in the ER and/or at time of admission. Presents with significant back pain worse from baseline. History concerning for recent treatment for osteomyelitis of the jaw and new diagnosis of UTI last Thursday was presenting symptom of fevers and acute delirium with hallucinations. Patient was prescribed Keflex at that ER visit. She will require broad spectrum IV antibiotics until sepsis/osteomyelitis lumbar spine ruled out. She will require judicious use of pain medications with close monitoring getting advanced age until pain better controlled. This will consist of combination of oral and IV narcotics as well as IV skeletal muscle relaxants. She will require inpatient MRI of lumbar spine to determine if back pain is related to known HNP vs lumbar osteomyelitis. She will require inpatient PT and OT evaluation. She appears somewhat volume depleted and will require IV fluids. She will also require close nursing care regarding monitoring of hemodynamic status, I/O, assistance with mobilization and activities of daily living.  Chief Complaint: Back pain  HPI: [name] is a 83 y.o. female with medical history significant for diabetes mellitus 2 on oral agents, hypertension, remote history of CVA, CK D3, dyslipidemia and known HNP status post discectomy in March 2016. Patient had several dental extractions performed in February March 2018. Post procedure she had persistent dental pain that was initially felt due to "dry socket". Eventual evaluation revealed infectious concerns in lead to a final diagnosis of osteomyelitis. She was treated  with antibiotics by her dentist (Dr. Jonetta Osgood) which have been completed several weeks ago. Plan was to pursue outpatient hyperbaric oxygen treatment at the wound care center in [place] beginning last week the patient was unable to attend secondary to issues related to back pain. Also last week patient began having night sweats and what the family thought was fevers. She developed altered mental status with hallucinations and agitation prompting a visit to the [facility] emergency room last Thursday where patient was diagnosed with UTI and was started on Keflex. Because of persistent issues with lethargy, altered mental status and persistent back pain family brought patient in for further evaluation here in the ER. Of note attempts were made to pursue imaging of patient's back in the ER but because of uncontrolled pain was felt she would not tolerate MRI scanning so this was not pursued immediately. Maxillofacial CT showed no evidence of fluid collection or abscess and only demonstrated expected findings of a small metallic foreign body within the right posterior mandible dental socket with packing material. 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  ED Course:  Vital Signs: BP (!) 144/67   Pulse (!) 106   Temp 100.2 F (37.9 C) (Rectal)   Resp 18   Wt 68.5 kg (151 lb)   SpO2 96%   BMI 27.62 kg/m  PCXR: No acute findings CT head without contrast: Neg Maxillofacial CT with contrast: Packing material and radiopaque foreign body within the right mandible molar socket with recommended direct inspection to clarify. No CT findings of drainable fluid collection or acute process in the face Lab data: Sodium 136, potassium 3.7, chloride 97, CO2 25, glucose 244, BUN 33, creatinine 1.14, anion gap 14, LFTs unremarkable, CRP 5.6, initial lactic acid  2.21 down to 1.76 after treatment, white count 18,000 with neutrophils 74% and absolute feels 13.4%, hemoglobin 12.8, platelets  402,000, sedimentation rate 72, Tylenol level less than 10, salicylate level less than 7, urinalysis unremarkable with borderline elevated specific gravity of 1.020, blood cultures and urine culture obtained in the ER Medications and treatments: Normal saline bolus 2.5 L, Zosyn 3.375 g IV 1, vancomycin 1 g IV 1, Tylenol 650 mg PR 1, fentanyl 50 g IV 1, Ativan 1 mg IV 1  Review of Systems:  In addition to the HPI above,  No Headache, changes with Vision or hearing, new weakness, tingling, numbness in any extremity, dizziness, dysarthria or word finding difficulty, gait disturbance or imbalance, tremors or seizure activity No problems swallowing food or Liquids, indigestion/reflux, choking or coughing while eating, abdominal pain with or after eating No Chest pain, Cough or Shortness of Breath, palpitations, orthopnea or DOE No Abdominal pain, N/V, melena,hematochezia, dark tarry stools, constipation No dysuria, malodorous urine, hematuria or flank pain No new skin rashes, lesions, masses or bruises, No swelling or redness No recent unintentional weight gain or loss No polyuria, polydypsia or polyphagia   Past Medical History:  Diagnosis Date  . Anxiety   . Arthritis    osteo  . Back pain   . Cancer ([place])    squamas cell on scalp--took 14 radiation tx--2013  . Complication of anesthesia    resp distress -on vent after surgery  . GERD (gastroesophageal reflux disease)   . Headache    out grew them  . Hypertension   . Intestinal obstruction ([place])   . Macular degeneration of left eye    dx over 55 yrs ago.....hasn't changed much  . Shingles   . Stroke [facility])    Mini stroke at age 75 years old. No lasting deficits    Past Surgical History:  Procedure Laterality Date  . ABDOMINAL HYSTERECTOMY    . APPENDECTOMY    . CHOLECYSTECTOMY    . COLONOSCOPY    . EYE SURGERY    . knee replacement surgery     bilateral  . LUMBAR LAMINECTOMY/DECOMPRESSION MICRODISCECTOMY Right  08/09/2014   Procedure: Right Lumbar Four to Five Microdiskectomy;  Surgeon: Floyce Stakes, MD;  Location: [facility]UR[facility] ORS;  Service: Neurosurgery;  Laterality: Right;  Right L4-5 Microdiskectomy  . ORIF PERIPROSTHETIC FRACTURE  03/31/2012   Procedure: OPEN REDUCTION INTERNAL FIXATION (ORIF) PERIPROSTHETIC FRACTURE;  Surgeon: Mauri Pole, MD;  Location: [facility] ORS;  Service: Orthopedics;  Laterality: Left;  Open reduction internal fixation Left distal femur periprosthetic fracture  . SMALL INTESTINE SURGERY  2011   went into respiratory distress and was on ventilator after surgery    Social History   Social History  . Marital status: Widowed    Spouse name: N/A  . Number of children: N/A  . Years of education: N/A   Occupational History  . Not on file.   Social History Main Topics  . Smoking status: Former Smoker    Years: 10.00    Quit date: 03/31/1991  . Smokeless tobacco: Never Used  . Alcohol use No  . Drug use: No  . Sexual activity: Not on file   Other Topics Concern  . Not on file   Social History Narrative  . No narrative on file    Mobility: Occasionally requires utilization of her own walker when back pain recurs Work history: Not obtained   Allergies  Allergen Reactions  . Sulfa Antibiotics Swelling  Mouth and tongue swelling    Family history reviewed and not pertinent to current admission diagnosis or clinical findings  Prior to Admission medications   Medication Sig Start Date End Date Taking? Authorizing Provider  amLODipine (NORVASC) 5 MG tablet Take 5 mg by mouth daily.   Yes [provider]  atorvastatin (LIPITOR) 40 MG tablet Take 40 mg by mouth daily.   Yes [provider]  baclofen (LIORESAL) 10 MG tablet Take 10 mg by mouth 2 (two) times daily as needed for muscle spasms.   Yes [provider]  carvedilol (COREG) 25 MG tablet Take 25 mg by mouth 2 (two) times daily with a meal.   Yes [provider]    cephALEXin (KEFLEX) 250 MG capsule Take 250 mg by mouth 3 (three) times daily.   Yes [provider]  cyclobenzaprine (FLEXERIL) 10 MG tablet Take 1 tablet (10 mg total) by mouth 3 (three) times daily as needed for muscle spasms. 10/21/14  Yes Leonard Schwartz, MD  diclofenac (CATAFLAM) 50 MG tablet Take 50 mg by mouth 2 (two) times daily.   Yes [provider]  gabapentin (NEURONTIN) 300 MG capsule Take 300 mg by mouth 3 (three) times daily.   Yes [provider]  HYDROcodone-acetaminophen (NORCO/VICODIN) 5-325 MG tablet Take 1 tablet by mouth every 4 (four) hours as needed for moderate pain.   Yes [provider]  losartan-hydrochlorothiazide (HYZAAR) 100-25 MG per tablet Take 1 tablet by mouth daily.   Yes [provider]  metFORMIN (GLUCOPHAGE-XR) 500 MG 24 hr tablet Take 500 mg by mouth daily with breakfast.   Yes [provider]  mirtazapine (REMERON) 30 MG tablet Take 30 mg by mouth at bedtime.   Yes [provider]  ranitidine (ZANTAC) 150 MG tablet Take 150 mg by mouth daily.   Yes [provider]  traMADol-acetaminophen (ULTRACET) 37.5-325 MG per tablet Take 2 tablets by mouth 4 (four) times daily as needed (pain).   Yes [provider]  feeding supplement (ENSURE COMPLETE) LIQD Take 237 mLs by mouth 3 (three) times daily between meals. Patient not taking: Reported on 12/09/2016 04/08/12   Velvet Bathe, MD  gabapentin (NEURONTIN) 100 MG capsule Take 2 capsules (200 mg total) by mouth 3 (three) times daily. Patient not taking: Reported on 12/09/2016 10/28/15   Davonna Belling, MD  oxyCODONE-acetaminophen (PERCOCET/ROXICET) 5-325 MG tablet Take 1-2 tablets by mouth every 6 (six) hours as needed for severe pain. Patient not taking: Reported on 12/09/2016 10/28/15   Davonna Belling, MD    Physical Exam: Vitals:   12/09/16 0300 12/09/16 0400 12/09/16 0700 12/09/16 0705  BP: (!) 148/84 130/64 (!) 144/67   Pulse: (!)  102 (!) 102 (!) 106   Resp: _0 Temp:      TempSrc:      SpO2: 99% 98% 96%   Weight:    68.5 kg (151 lb)      Constitutional: NADBut has recently been sedated with narcotic pain medications as well as benzodiazepine, calm, appears to be comfortable at this juncture Eyes: PERRL, lids and conjunctivae normal ENMT: Mucous membranes are dry. Posterior pharynx clear of any exudate or lesions. Poor dentition with multiple missing teeth and as previously mentioned on CT scan remaining teeth with significant cavities.  Neck: normal, supple, no masses, no thyromegaly Respiratory: clear to auscultation bilaterally, no wheezing, no crackles. Normal respiratory effort. No accessory muscle use.  Cardiovascular: Regular rate and rhythm, no murmurs /  rubs / gallops. No extremity edema. 2+ pedal pulses. No carotid bruits.  Abdomen: no tenderness, no masses palpated. No hepatosplenomegaly. Bowel sounds positive.  Musculoskeletal: no clubbing / cyanosis. No joint deformity upper and lower extremities. Good ROM, no contractures. Normal muscle tone. No pain with palpation over spinal column from neck to sacrum Skin: no rashes, lesions, ulcers. No induration Neurologic: CN 2-12 grossly intact. Sensation intact, DTR normal. Strength appears to be 5/5 x all 4 extremities but exam limited by patient's positioning during initial evaluation. Attending physician returned and patient had been ambulated by RN in an attempt to reach the bathroom and at that time no obvious gait or weightbearing deficits noted Psychiatric: Normal judgment and insight. Alert and oriented x 3. Normal mood.    Labs on Admission: I have personally reviewed following labs and imaging studies  CBC:  Recent Labs Lab 12/08/16 2350  WBC 18.0*  NEUTROABS 13.4*  HGB 12.8  HCT 39.7  MCV 92.3  PLT 701*   Basic Metabolic Panel:  Recent Labs Lab 12/08/16 2350  NA 136  K 3.7  CL 97*  CO2 25  GLUCOSE 244*  BUN 33*  CREATININE  1.14*  CALCIUM 9.6   GFR: CrCl cannot be calculated (Unknown ideal weight.). Liver Function Tests:  Recent Labs Lab 12/08/16 2350  AST 23  ALT 17  ALKPHOS 58  BILITOT 0.5  PROT 8.4*  ALBUMIN 3.7    Recent Labs Lab 12/08/16 2350  LIPASE 42   No results for input(s): AMMONIA in the last 168 hours. Coagulation Profile: No results for input(s): INR, PROTIME in the last 168 hours. Cardiac Enzymes: No results for input(s): CKTOTAL, CKMB, CKMBINDEX, TROPONINI in the last 168 hours. BNP (last 3 results) No results for input(s): PROBNP in the last 8760 hours. HbA1C: No results for input(s): HGBA1C in the last 72 hours. CBG:  Recent Labs Lab 12/09/16 0256  GLUCAP 206*   Lipid Profile: No results for input(s): CHOL, HDL, LDLCALC, TRIG, CHOLHDL, LDLDIRECT in the last 72 hours. Thyroid Function Tests: No results for input(s): TSH, T4TOTAL, FREET4, T3FREE, THYROIDAB in the last 72 hours. Anemia Panel: No results for input(s): VITAMINB12, FOLATE, FERRITIN, TIBC, IRON, RETICCTPCT in the last 72 hours. Urine analysis:    Component Value Date/Time   COLORURINE YELLOW 12/09/2016 0016   APPEARANCEUR CLEAR 12/09/2016 0016   LABSPEC 1.020 12/09/2016 0016   PHURINE 6.0 12/09/2016 0016   GLUCOSEU NEGATIVE 12/09/2016 0016   HGBUR NEGATIVE 12/09/2016 0016   BILIRUBINUR NEGATIVE 12/09/2016 0016   KETONESUR NEGATIVE 12/09/2016 0016   PROTEINUR NEGATIVE 12/09/2016 0016   UROBILINOGEN 1.0 04/04/2012 1310   NITRITE NEGATIVE 12/09/2016 0016   LEUKOCYTESUR NEGATIVE 12/09/2016 0016   Sepsis Labs: _0 (procalcitonin:4,lacticidven:4) )No results found for this or any previous visit (from the past 240 hour(s)).   Radiological Exams on Admission: Ct Head Wo Contrast  Result Date: 12/09/2016 CLINICAL DATA:  Fever and altered level of consciousness. EXAM: CT HEAD WITHOUT CONTRAST TECHNIQUE: Contiguous axial images were obtained from the base of the skull through the vertex without  intravenous contrast. COMPARISON:  None. FINDINGS: Brain: Mild cerebral atrophy. No ventricular dilatation. Low-attenuation changes throughout the deep white matter likely representing small vessel ischemia. No mass effect or midline shift. No abnormal extra-axial fluid collections. Gray-white matter junctions are distinct. Basal cisterns are not effaced. No evidence of acute intracranial hemorrhage. Vascular: No hyperdense vessel or unexpected calcification. Skull: Normal. Negative for fracture or focal lesion. Sinuses/Orbits: No acute finding. Other: None. IMPRESSION:  No acute intracranial abnormalities. Electronically Signed   By: Lucienne Capers M.D.   On: 12/09/2016 00:57   Ct Maxillofacial W Contrast  Result Date: 12/09/2016 CLINICAL DATA:  Altered mental status worsening for 3 days. Fever, dental infection. Status post multiple dental extractions February and March. Status post biopsy demonstrating reported osteomyelitis. History of cancer and radiation. EXAM: CT MAXILLOFACIAL WITH CONTRAST TECHNIQUE: Multidetector CT imaging of the maxillofacial structures was performed. Multiplanar CT image reconstructions were also generated. A small metallic BB was placed on the right temple in order to reliably differentiate right from left. COMPARISON:  None. FINDINGS: OSSEOUS: The mandible is intact, the condyles are located. No acute facial fracture. No destructive bony lesions. Absent maxillary teeth. Residual maxillary teeth demonstrate dental caries. Severe RIGHT temporomandibular osteoarthrosis. Small metallic foreign body within RIGHT posterior mandible dental socket with packing material. ORBITS: Ocular globes and orbital contents are normal. SINUSES: Paranasal sinuses are well aerated. Nasal septum is midline. Trace LEFT mastoid effusion. SOFT TISSUES: No significant soft tissue swelling. No subcutaneous gas or radiopaque foreign bodies. Moderate calcific atherosclerosis of the carotid siphons. LIMITED  INTRACRANIAL: Normal. IMPRESSION: Packing material and radiopaque foreign body within RIGHT mandible molar socket, recommend direct inspection. No CT findings of drainable fluid collection or acute process in the face. Electronically Signed   By: Elon Alas M.D.   On: 12/09/2016 06:35   Dg Chest Port 1 View  Result Date: 12/08/2016 CLINICAL DATA:  Acute onset of lethargy and confusion. Recent urinary tract infection. Initial encounter. EXAM: PORTABLE CHEST 1 VIEW COMPARISON:  Chest radiograph performed 11/13/2016 FINDINGS: The lungs are well-aerated. Mild bibasilar atelectasis or scarring is noted. Peribronchial thickening is seen. There is no evidence of pleural effusion or pneumothorax. The cardiomediastinal silhouette is borderline enlarged. No acute osseous abnormalities are seen. IMPRESSION: Mild bibasilar atelectasis or scarring noted. Peribronchial thickening seen. Borderline cardiomegaly. Electronically Signed   By: Garald Balding M.D.   On: 12/08/2016 23:38    EKG: (Independently reviewed) appears to have been ordered in the ER but not yet completed  Assessment/Plan Principal Problem:   ?? Sepsis  -Presents with altered mentation and setting of sedative medications and apparent recent diagnosis of UTI on antibiotics prior to admission; also presents with exacerbation of chronic back pain -Initial sepsis physiology met as follows: Tachycardia, white count greater than 12,000 with greater than 10% bands, elevated lactic acid greater than 2, very mild acute kidney injury on chronic kidney disease, and altered mentation. -Lactic acid rapidly corrected with administration of IV fluids -At present time no definitive source of infection identified although given reported history of UTI this may be underlying source-initial urinalysis not helpful given preadmission antibiotics -Although has chronic back pain, given recent history of jaw ostium myelitis need to consider seeding of infection to  low back this precipitating lumbar osteomyelitis -we'll attempt to obtain MRI of lumbar spine during admission (did not tolerate in the ER despite IV Ativan and IV narcotic medications) -Chek Procalcitonin and PT/INR -Follow-up on blood cultures and urine culture obtained in the ER -Continue broad-spectrum empiric antibiotics (Zosyn/vancomycin)  Active Problems:   History of Lumbar herniated disc/Back pain -Family member states is chronic ongoing problem -Patient underwent discectomy in 2016 by Dr. Joya Salm -Follow up MRI December 2017 with several nonspecific areas from the low thoracic through the lumbar spine without any herniation of disc noted -According to family, patient still reports radicular pain in right leg periodically which has worsened recently and requires utilization of walker  to mobilize -Evaluation as stated above -PT/OT evaluation -Mobilize with assistance    History of oral surgery/Osteomyelitis of jaw -Has been treated by Dr. Jonetta Osgood in [place] with antibiotics and outpatient hyperbaric oxygen treatment at the wound care center in [place] had been ordered but patient had not yet attended her first session -Order placed to obtain records from above dentist -On clinical exam face is not swollen and jaw nontender to external palpation -CT unremarkable except for expected dental packing in place -no indication at this juncture to ask ENT or dentist to evaluate patient in the acute care setting -Broad-spectrum IV antibiotics as described above -Soft diet and continue gentle IV fluid hydration until proves can tolerate adequate oral intake    Hypertension -Worsened by ongoing pain -Patient appears dehydrated so will hold preadmission losartan with hydrochlorothiazide -Continue carvedilol -Hold Norvasc until euvolemic    CKD (chronic kidney disease), stage III -Renal function stable with slightly elevated BUN consistent with likely dehydration -Hold ARB and  thiazide diuretic as above -Gentle IV fluid hydration post receipt of IV contrast -Hold preadmission NSAIDs    Diabetes mellitus type 2, uncontrolled  -Initial serum glucose 244 -Hold home metformin given CKD, recent IV contrast and acute illness -SSI -HgbA1c    Anemia -Hemoglobin in 2016 was 11.1 and current hemoglobin 12.8    HLD (hyperlipidemia) -Continue statin    History of CVA (cerebrovascular accident)      DVT prophylaxis: Lovenox  Code Status: Full-daughter to discuss with other family members stating "I think mom probably would not want to be resuscitated" Family Communication: Daughter Disposition Plan: Home pending PT evaluation Consults called: None    ELLIS,ALLISON L. ANP-BC [facility] Pager [phone]   If 7PM-7AM, please contact night-coverage [email or web address] Password [facility]  12/09/2016, 7:54 AM

## 2016-12-09 NOTE — ED Notes (Signed)
Lactic: 2.21

## 2016-12-09 NOTE — ED Provider Notes (Signed)
Seen by Dr Blaine Hamper He feels source of infection is dental due to recent biopsy that showed osteomyelitic However, pt has had significant back pain over past week with h/o lumbar surgery Plan to get MRI lumbar spine to r/o infectious process If negative, will transfer to [facility] for oral surgery care    Ripley Fraise, MD 12/09/16 [phone]

## 2016-12-09 NOTE — Progress Notes (Signed)
  Possible admission vs. Transfer to [facility]  83 year old lady with past medical history of hypertension, hyperlipidemia, GERD, depression, anxiety, stroke, back pain, chronic kidney disease-stage III, who presents with altered mental status, back pain, fever and sepsis.  Per patient's daughter, patient had total of 4 teeth removed in Feb and March. One of the tooth socket was infected. Pt has been managed by oral surgeon, Dr. Jonetta Osgood in [place]. Per her daughter, lower jaw bone biopsy was performed, which showed that infection has involved bone. They plan to do hyperbaric oxygen therapy, but has not started yet. The surgical site in lower jaw is packed and is due to be changed today. Pt also has worsening back pain. Pt was found to be septic with leukocytosis of 18.0, temperature 122, tachycardia, tachypnea and lactic acid 2.21. I think the source of infection is likely due to lower jaw osteomyelitis, therefore asked EDP physician to help to transfer patient to tertiary [facility] due to the difficulty in getting oral surgeon consultation. EDP, Dr. Dr. Christy Gentles thinks patient may have another source of infection in back since patient has worsening back pain. Dr. Christy Gentles would like to rule out this possibility before calling out for transfer. Now, the admission is on hold. Pt was started with antibiotics and IV fluids treatment.   Ivor Costa, MD  [facility] Pager [phone]  If 7PM-7AM, please contact night-coverage [email or web address] Password [facility] 12/09/2016, 6:22 AM

## 2016-12-09 NOTE — Progress Notes (Signed)
Pharmacy Antibiotic Note  [name] is a 83 y.o. female admitted on 12/08/2016 with sepsis.  Pharmacy has been consulted for Vancomycin and Zosyn dosing. Estimated CrCl 42 ml/min Recently diagnosed with UTI and started Keflex.  Vancomycin 1gm and Zosyn 3.375gm IV given in ED ~0000  Plan: Zosyn 3.375gm IV q8h - doses over 4 hours Vancomycin 552m IV q12h Will f/u micro data, renal function, and pt's clinical condition Vanc trough prn      Temp (24hrs), Avg:100.2 F (37.9 C), Min:100.2 F (37.9 C), Max:100.2 F (37.9 C)   Recent Labs Lab 12/08/16 2350 12/09/16 0006 12/09/16 0303  WBC 18.0*  --   --   CREATININE 1.14*  --   --   LATICACIDVEN  --  2.21* 1.76    CrCl cannot be calculated (Unknown ideal weight.).    Allergies  Allergen Reactions  . Sulfa Antibiotics Swelling    Mouth and tongue swelling    Antimicrobials this admission: 5/7 Vanc >>  5/7 Zosyn >>   Dose adjustments this admission: n/a  Microbiology results: 5/6 BCx x2:  5/7 UCx:    Thank you for allowing pharmacy to be a part of this patient's care.  CSherlon Handing PharmD, BCPS Clinical pharmacist, pager [phone]/02/2017 5:29 AM

## 2016-12-09 NOTE — ED Provider Notes (Signed)
PATIENT UNABLE TO TOLERATE MRI DUE TO BACK PAIN STILL UNCLEAR CAUSE OF FEVER DUE TO REPORTED H/O OSTEO IN HER MANDIBLE AFTER DISCUSSION WITH RADIOLOGY, WE ORDERED CT MAXILLOFACIAL WITH CONTRAST NO SIGNS OF ACUTE INFECTION ON CT FACE I SPOKE TO DR NIU TO HAVE PATIENT ADMITTED TO [place] I STILL FEEL MRI LUMBAR SPINE IS WARRANTED BP (!) 144/67   Pulse (!) 106   Temp 100.2 F (37.9 C) (Rectal)   Resp 18   Wt 68.5 kg   SpO2 96%   BMI 27.62 kg/m  PT RESTING COMFORTABLY I UPDATED DAUGHTER ON PLAN    Ripley Fraise, MD 12/09/16 [phone]

## 2016-12-09 NOTE — Progress Notes (Addendum)
Patient's oral surgeon notified this facility that due to underlying osteomyelitis patient's dental packing needs to be removed. Of note packing consists of wax in iodoform gauze and can be easily removed by either medical staff or nursing staff. Order placed for RN to remove packing and to notify medical staff if unable to remove.  Magnesium also low at 1.6 so we'll give IV bolus  Erin Hearing, ANP

## 2016-12-09 NOTE — Progress Notes (Signed)
Dental packing removed as ordered, very small amount in socket, packing had a pale green coloration to it, other nurse that removed the packing (as this nurse held tongue out the way and held light to see area) noted a foul smell from the packing and pt stated she could smell it also.  Had pt to gently rinse with warm water and spit.

## 2016-12-10 ENCOUNTER — Inpatient Hospital Stay (HOSPITAL_COMMUNITY): Payer: Medicare Other

## 2016-12-10 DIAGNOSIS — M545 Low back pain: Secondary | ICD-10-CM

## 2016-12-10 LAB — CBC WITH DIFFERENTIAL/PLATELET
BASOS PCT: 0 %
Basophils Absolute: 0 10*3/uL (ref 0.0–0.1)
EOS ABS: 0.2 10*3/uL (ref 0.0–0.7)
Eosinophils Relative: 2 %
HCT: 29.4 % — ABNORMAL LOW (ref 36.0–46.0)
Hemoglobin: 9.7 g/dL — ABNORMAL LOW (ref 12.0–15.0)
Lymphocytes Relative: 26 %
Lymphs Abs: 3 10*3/uL (ref 0.7–4.0)
MCH: 30.5 pg (ref 26.0–34.0)
MCHC: 33 g/dL (ref 30.0–36.0)
MCV: 92.5 fL (ref 78.0–100.0)
MONO ABS: 0.9 10*3/uL (ref 0.1–1.0)
Monocytes Relative: 8 %
NEUTROS ABS: 7.3 10*3/uL (ref 1.7–7.7)
Neutrophils Relative %: 64 %
Platelets: 311 10*3/uL (ref 150–400)
RBC: 3.18 MIL/uL — ABNORMAL LOW (ref 3.87–5.11)
RDW: 13.7 % (ref 11.5–15.5)
WBC: 11.4 10*3/uL — ABNORMAL HIGH (ref 4.0–10.5)

## 2016-12-10 LAB — CBC
HEMATOCRIT: 28.9 % — AB (ref 36.0–46.0)
HEMOGLOBIN: 9.2 g/dL — AB (ref 12.0–15.0)
MCH: 29.5 pg (ref 26.0–34.0)
MCHC: 31.8 g/dL (ref 30.0–36.0)
MCV: 92.6 fL (ref 78.0–100.0)
Platelets: 301 10*3/uL (ref 150–400)
RBC: 3.12 MIL/uL — ABNORMAL LOW (ref 3.87–5.11)
RDW: 13.8 % (ref 11.5–15.5)
WBC: 9.6 10*3/uL (ref 4.0–10.5)

## 2016-12-10 LAB — COMPREHENSIVE METABOLIC PANEL
ALBUMIN: 2.7 g/dL — AB (ref 3.5–5.0)
ALK PHOS: 41 U/L (ref 38–126)
ALT: 13 U/L — ABNORMAL LOW (ref 14–54)
ANION GAP: 8 (ref 5–15)
AST: 18 U/L (ref 15–41)
BUN: 13 mg/dL (ref 6–20)
CALCIUM: 8 mg/dL — AB (ref 8.9–10.3)
CO2: 23 mmol/L (ref 22–32)
Chloride: 107 mmol/L (ref 101–111)
Creatinine, Ser: 0.87 mg/dL (ref 0.44–1.00)
GFR calc non Af Amer: 60 mL/min — ABNORMAL LOW (ref >60)
GLUCOSE: 117 mg/dL — AB (ref 65–99)
Potassium: 3.3 mmol/L — ABNORMAL LOW (ref 3.5–5.1)
SODIUM: 138 mmol/L (ref 135–145)
TOTAL PROTEIN: 6.3 g/dL — AB (ref 6.5–8.1)
Total Bilirubin: 0.5 mg/dL (ref 0.3–1.2)

## 2016-12-10 LAB — BASIC METABOLIC PANEL
Anion gap: 8 (ref 5–15)
BUN: 14 mg/dL (ref 6–20)
CO2: 23 mmol/L (ref 22–32)
CREATININE: 0.84 mg/dL (ref 0.44–1.00)
Calcium: 8.2 mg/dL — ABNORMAL LOW (ref 8.9–10.3)
Chloride: 108 mmol/L (ref 101–111)
Glucose, Bld: 136 mg/dL — ABNORMAL HIGH (ref 65–99)
POTASSIUM: 3.3 mmol/L — AB (ref 3.5–5.1)
SODIUM: 139 mmol/L (ref 135–145)

## 2016-12-10 LAB — AMMONIA: Ammonia: 10 umol/L (ref 9–35)

## 2016-12-10 LAB — MAGNESIUM
Magnesium: 2.3 mg/dL (ref 1.7–2.4)
Magnesium: 2.1 mg/dL (ref 1.7–2.4)

## 2016-12-10 LAB — GLUCOSE, CAPILLARY
GLUCOSE-CAPILLARY: 128 mg/dL — AB (ref 65–99)
GLUCOSE-CAPILLARY: 124 mg/dL — AB (ref 65–99)
GLUCOSE-CAPILLARY: 175 mg/dL — AB (ref 65–99)
GLUCOSE-CAPILLARY: 111 mg/dL — AB (ref 65–99)
Glucose-Capillary: 112 mg/dL — ABNORMAL HIGH (ref 65–99)
Glucose-Capillary: 147 mg/dL — ABNORMAL HIGH (ref 65–99)

## 2016-12-10 LAB — URINE CULTURE: CULTURE: NO GROWTH

## 2016-12-10 LAB — HEMOGLOBIN A1C
Hgb A1c MFr Bld: 7.2 % — ABNORMAL HIGH (ref 4.8–5.6)
MEAN PLASMA GLUCOSE: 160 mg/dL

## 2016-12-10 LAB — LACTIC ACID, PLASMA: LACTIC ACID, VENOUS: 0.8 mmol/L (ref 0.5–1.9)

## 2016-12-10 MED ORDER — LORAZEPAM 2 MG/ML IJ SOLN
1.0000 mg | Freq: Once | INTRAMUSCULAR | Status: AC
  Administered 2016-12-10: 1 mg via INTRAVENOUS
  Filled 2016-12-10: qty 1

## 2016-12-10 MED ORDER — POTASSIUM CHLORIDE CRYS ER 20 MEQ PO TBCR
20.0000 meq | EXTENDED_RELEASE_TABLET | Freq: Once | ORAL | Status: AC
  Administered 2016-12-10: 20 meq via ORAL
  Filled 2016-12-10: qty 1

## 2016-12-10 MED ORDER — GADOBENATE DIMEGLUMINE 529 MG/ML IV SOLN
15.0000 mL | Freq: Once | INTRAVENOUS | Status: AC | PRN
  Administered 2016-12-10: 15 mL via INTRAVENOUS

## 2016-12-10 MED ORDER — OXYCODONE-ACETAMINOPHEN 5-325 MG PO TABS
ORAL_TABLET | ORAL | Status: AC
  Filled 2016-12-10: qty 1

## 2016-12-10 MED ORDER — AMLODIPINE BESYLATE 5 MG PO TABS
5.0000 mg | ORAL_TABLET | Freq: Every day | ORAL | Status: DC
  Administered 2016-12-10 – 2016-12-11 (×2): 5 mg via ORAL
  Filled 2016-12-10 (×2): qty 1

## 2016-12-10 MED ORDER — ALUM & MAG HYDROXIDE-SIMETH 200-200-20 MG/5ML PO SUSP
30.0000 mL | Freq: Once | ORAL | Status: AC
  Administered 2016-12-10: 30 mL via ORAL
  Filled 2016-12-10: qty 30

## 2016-12-10 NOTE — Progress Notes (Signed)
2232 Patient lethargic arousable but falls asleep after speaking few words.Unable to safely give bedtime medications at this time.Vital signs B/P 125/59 HR 89 RR 18 oxygen saturations 93% room air and blood sugar 116.Text paged Forrest Moron NP.

## 2016-12-10 NOTE — Progress Notes (Signed)
OT Cancellation Note  Patient Details Name: [name] MRN: [id number] DOB: 11/09/1931   Cancelled Treatment:    Reason Eval/Treat Not Completed: Patient at procedure or test/ unavailable. MRI. Will follow.  Malka So 12/10/2016, 1:45 PM

## 2016-12-10 NOTE — Progress Notes (Signed)
PROGRESS NOTE                                                                                                                                                                                                             Patient Demographics:    [name], is a 83 y.o. female, DOB - 10-20-1931, PTC:[id number]  Admit date - 12/08/2016   Admitting Physician Ivor Costa, MD  Outpatient Primary MD for the patient is [name], PA-C  LOS - 1   Chief Complaint  Patient presents with  . Altered Mental Status  . Urinary Tract Infection       Brief Narrative    83 y.o. female with medical history significant for diabetes mellitus 2 on oral agents, hypertension, remote history of CVA, CK D3, dyslipidemia and known HNP status post discectomy in March 2016. Patient had several dental extractions performed in February March 2018. Post procedure she had persistent dental pain that was initially felt due to "dry socket". Eventual evaluation revealed infectious concerns in lead to a final diagnosis of osteomyelitis. She was treated with antibiotics by her dentist (Dr. Jonetta Osgood) which have been completed several weeks ago. Plan was to pursue outpatient hyperbaric oxygen treatment at the wound care center in [place] beginning last week the patient was unable to attend secondary to issues related to back pain, she was admitted with sepsis  Subjective:    [name] today has, No headache, No chest pain, No abdominal pain - No Nausea,Reports her lower back pain significantly subsided, as well reports minimal toothache.    Assessment  & Plan :    Principal Problem:   Sepsis ([place]) Active Problems:   Hypertension   Anemia   History of Lumbar herniated disc   HLD (hyperlipidemia)   History of CVA (cerebrovascular accident)   CKD (chronic kidney disease), stage III   Back pain   History of oral surgery   Osteomyelitis of jaw   Diabetes mellitus type 2,  uncontrolled ([place])  Sepsis - Presents with altered mental status, leukocytosis, tachycardia, and elevated lactic acid. - Source of sepsis most likely related to infected oral surgical site as she had recent multiple tooth infection operated on with known mandibular osteomyelitis, very likely has been infected when it was packed, as was significant malodorous discharge when packing  was removed yesterday,  discussed today with patient oral surgeon  Dr. Jonetta Osgood in [place] , she is on Keflex for mandibular osteomyelitis, she should continue on discharge, no further packing needed currently, and she will follow her in outpatient setting, she has been arranging for hyperbaric oxygen treatment at wound care center in [place]. - Lumbar spine with no evidence of infection or discitis, blood culture remains negative to date, negative urine analysis(even though has been recently treated for UTI at [facility]), chest x-ray with no evidence of infection as well. - We'll continue with IV vancomycin and Zosyn for next 1-2 days, and she can be discharged on Keflex if cultures remain negative to follow with her oral surgeon in outpatient setting.  History of oral surgery/osteomyelitis of the jaw - Please see above discussion -CT unremarkable except for expected dental packing in place  Lower back pain - Chronic secondary to lumbar radiculopathy, no evidence of infection : MRI lumbar spine with and without contrast, she follows with Dr. Sheryle Spray as an outpatient  Hypertension - Continue with Coreg , blood pressure started to increase, so we'll resume amlodipine today, and Iikely will resume losartan/hydrochlorothiazide in 1-2 days .  Diabetes mellitus - Continue to hold metformin, continue with insulin sliding scale, hemoglobin A1c 7.2    HLD (hyperlipidemia) -Continue statin  Hypokalemia - Repleted, recheck in a.m.   Code Status : Full  Family Communication  : D/W daughter at  bedside  Disposition Plan  : Home when stable  Consults  :  None  Procedures  : none  DVT Prophylaxis  :  Lovenox   Lab Results  Component Value Date   PLT 301 12/10/2016    Antibiotics  :    Anti-infectives    Start     Dose/Rate Route Frequency Ordered Stop   12/09/16 1200  vancomycin (VANCOCIN) 500 mg in sodium chloride 0.9 % 100 mL IVPB     500 mg 100 mL/hr over 60 Minutes Intravenous Every 12 hours 12/09/16 0534     12/09/16 0830  piperacillin-tazobactam (ZOSYN) IVPB 3.375 g     3.375 g 12.5 mL/hr over 240 Minutes Intravenous Every 8 hours 12/09/16 0819     12/09/16 0815  piperacillin-tazobactam (ZOSYN) IVPB 3.375 g  Status:  Discontinued     3.375 g 100 mL/hr over 30 Minutes Intravenous Every 8 hours 12/09/16 0813 12/09/16 0819   12/09/16 0800  piperacillin-tazobactam (ZOSYN) IVPB 3.375 g  Status:  Discontinued     3.375 g 12.5 mL/hr over 240 Minutes Intravenous Every 8 hours 12/09/16 0534 12/09/16 0813   12/08/16 2330  piperacillin-tazobactam (ZOSYN) IVPB 3.375 g     3.375 g 100 mL/hr over 30 Minutes Intravenous  Once 12/08/16 2317 12/09/16 0105   12/08/16 2330  vancomycin (VANCOCIN) IVPB 1000 mg/200 mL premix     1,000 mg 200 mL/hr over 60 Minutes Intravenous  Once 12/08/16 2317 12/09/16 0114        Objective:   Vitals:   12/09/16 2232 12/10/16 0518 12/10/16 0636 12/10/16 1507  BP: (!) 125/59 (!) 150/78  (!) 134/58  Pulse: 89 86  87  Resp: _0 Temp:  98.4 F (36.9 C)  98.7 F (37.1 C)  TempSrc:  Oral  Oral  SpO2: 93% 98%  94%  Weight:   68.9 kg (151 lb 14.4 oz)   Height:        Wt Readings from Last 3 Encounters:  12/10/16 68.9 kg (151 lb 14.4 oz)  10/28/15 68.5  kg (151 lb)  10/25/14 67.1 kg (148 lb)     Intake/Output Summary (Last 24 hours) at 12/10/16 1644 Last data filed at 12/10/16 1508  Gross per 24 hour  Intake           3087.5 ml  Output              700 ml  Net           2387.5 ml     Physical Exam  Awake Alert, Oriented  X 3,  Supple Neck,No JVD,30 of right tooth infection has been examined, no further discharge has been noticed on right lower posterior jaw. Symmetrical Chest wall movement, Good air movement bilaterally, CTAB RRR,No Gallops,Rubs or new Murmurs, No Parasternal Heave +ve B.Sounds, Abd Soft, No tenderness , No rebound - guarding or rigidity. No Cyanosis, Clubbing or edema, No new Rash or bruise      Data Review:    CBC  Recent Labs Lab 12/08/16 2350 12/10/16 0001 12/10/16 0625  WBC 18.0* 11.4* 9.6  HGB 12.8 9.7* 9.2*  HCT 39.7 29.4* 28.9*  PLT 402* 311 301  MCV 92.3 92.5 92.6  MCH 29.8 30.5 29.5  MCHC 32.2 33.0 31.8  RDW 13.7 13.7 13.8  LYMPHSABS 2.8 3.0  --   MONOABS 1.8* 0.9  --   EOSABS 0.1 0.2  --   BASOSABS 0.1 0.0  --     Chemistries   Recent Labs Lab 12/08/16 2350 12/09/16 1028 12/10/16 0001 12/10/16 0625  NA 136  --  139 138  K 3.7  --  3.3* 3.3*  CL 97*  --  108 107  CO2 25  --  23 23  GLUCOSE 244*  --  136* 117*  BUN 33*  --  14 13  CREATININE 1.14*  --  0.84 0.87  CALCIUM 9.6  --  8.2* 8.0*  MG  --  1.6* 2.3 2.1  AST 23  --   --  18  ALT 17  --   --  13*  ALKPHOS 58  --   --  41  BILITOT 0.5  --   --  0.5   ------------------------------------------------------------------------------------------------------------------ No results for input(s): CHOL, HDL, LDLCALC, TRIG, CHOLHDL, LDLDIRECT in the last 72 hours.  Lab Results  Component Value Date   HGBA1C 7.2 (H) 12/09/2016   ------------------------------------------------------------------------------------------------------------------ No results for input(s): TSH, T4TOTAL, T3FREE, THYROIDAB in the last 72 hours.  Invalid input(s): FREET3 ------------------------------------------------------------------------------------------------------------------ No results for input(s): VITAMINB12, FOLATE, FERRITIN, TIBC, IRON, RETICCTPCT in the last 72 hours.  Coagulation profile  Recent Labs Lab  12/09/16 0800  INR 1.18    No results for input(s): DDIMER in the last 72 hours.  Cardiac Enzymes No results for input(s): CKMB, TROPONINI, MYOGLOBIN in the last 168 hours.  Invalid input(s): CK ------------------------------------------------------------------------------------------------------------------ No results found for: BNP  Inpatient Medications  Scheduled Meds: . atorvastatin  40 mg Oral q1800  . carvedilol  25 mg Oral BID WC  . enoxaparin (LOVENOX) injection  40 mg Subcutaneous Q24H  . famotidine  20 mg Oral Daily  . gabapentin  300 mg Oral TID  . insulin aspart  0-9 Units Subcutaneous Q4H  . mirtazapine  30 mg Oral QHS  . oxyCODONE-acetaminophen      . sodium chloride flush  3 mL Intravenous Q12H   Continuous Infusions: . sodium chloride 75 mL/hr at 12/09/16 2225  . methocarbamol (ROBAXIN)  IV    . piperacillin-tazobactam (ZOSYN)  IV 3.375 g (12/10/16 1640)  .  vancomycin Stopped (12/10/16 0727)   PRN Meds:.methocarbamol (ROBAXIN)  IV, morphine injection, ondansetron **OR** ondansetron (ZOFRAN) IV, oxyCODONE-acetaminophen  Micro Results Recent Results (from the past 240 hour(s))  Blood Culture (routine x 2)     Status: None (Preliminary result)   Collection Time: 12/08/16 11:46 PM  Result Value Ref Range Status   Specimen Description BLOOD RIGHT ANTECUBITAL  Final   Special Requests   Final    BOTTLES DRAWN AEROBIC AND ANAEROBIC Blood Culture adequate volume   Culture NO GROWTH 1 DAY  Final   Report Status PENDING  Incomplete  Blood Culture (routine x 2)     Status: None (Preliminary result)   Collection Time: 12/08/16 11:50 PM  Result Value Ref Range Status   Specimen Description BLOOD LEFT HAND  Final   Special Requests IN PEDIATRIC BOTTLE Blood Culture adequate volume  Final   Culture NO GROWTH 1 DAY  Final   Report Status PENDING  Incomplete  Urine culture     Status: None   Collection Time: 12/09/16 12:16 AM  Result Value Ref Range Status    Specimen Description URINE, CATHETERIZED  Final   Special Requests NONE  Final   Culture NO GROWTH  Final   Report Status 12/10/2016 FINAL  Final    Radiology Reports Dg Lumbar Spine Complete  Result Date: 12/03/2016 CLINICAL DATA:  Increased back pain radiating into the left leg after lifting heavy plantar 2 days ago. The patient is unable to lie supine for imaging and [place] images were obtained standing. History of previous back surgery. EXAM: LUMBAR SPINE - COMPLETE 4+ VIEW COMPARISON:  Lumbar spine series of November 16, 2015 and MRI of the lumbar spine of July 05, 2016 FINDINGS: There is chronic curvature convex toward the left centered at L3-4 which appears stable allowing for the standing positioning. The vertebral bodies are preserved in height. There is moderate disc space narrowing at all lumbar levels. There is no significant spondylolisthesis. Endplate osteophytes are present at multiple levels. There is facet joint hypertrophy at L5-S1. The observed portions of the sacrum are normal. Is calcification in the wall of the abdominal aorta and iliac arteries. IMPRESSION: Moderate to severe disc space height loss at all lumbar levels. No acute compression fracture. Stable mild to moderate levocurvature centered at L3-4. If the patient's symptoms persist, repeat lumbar spine MRI would be useful. Electronically Signed   By: David  Martinique M.D.   On: 12/03/2016 14:42   Ct Head Wo Contrast  Result Date: 12/09/2016 CLINICAL DATA:  Fever and altered level of consciousness. EXAM: CT HEAD WITHOUT CONTRAST TECHNIQUE: Contiguous axial images were obtained from the base of the skull through the vertex without intravenous contrast. COMPARISON:  None. FINDINGS: Brain: Mild cerebral atrophy. No ventricular dilatation. Low-attenuation changes throughout the deep white Erickson likely representing small vessel ischemia. No mass effect or midline shift. No abnormal extra-axial fluid collections. Gray-white Erickson  junctions are distinct. Basal cisterns are not effaced. No evidence of acute intracranial hemorrhage. Vascular: No hyperdense vessel or unexpected calcification. Skull: Normal. Negative for fracture or focal lesion. Sinuses/Orbits: No acute finding. Other: None. IMPRESSION: No acute intracranial abnormalities. Electronically Signed   By: Lucienne Capers M.D.   On: 12/09/2016 00:57   Mr Lumbar Spine W Wo Contrast  Result Date: 12/10/2016 CLINICAL DATA:  Low back pain. Chills and night sweats. Recent history of osteomyelitis of the jaw. Previous back surgery. EXAM: MRI LUMBAR SPINE WITHOUT AND WITH CONTRAST TECHNIQUE: Multiplanar and multiecho pulse sequences  of the lumbar spine were obtained without and with intravenous contrast. CONTRAST:  15 cc MultiHance COMPARISON:  CT 12/05/2016 FINDINGS: Segmentation:  5 lumbar type vertebral bodies. Alignment: Curvature convex to the left with the apex at L3. Straightening the normal cervical lordosis. 2 mm anterolisthesis L4-5. Vertebrae: No fracture or primary bone lesion. Chronic discogenic marrow changes. Conus medullaris: Extends to the L1 level and appears normal. Paraspinal and other soft tissues: Negative except for renal cysts. Disc levels: No significant finding at T11-12 or T12-L1. L1-2: Endplate osteophytes and bulging of the disc. Mild narrowing of both lateral recesses without visible neural compression. L2-3: Endplate osteophytes and bulging of the disc. Mild facet hypertrophy on the right. Mild narrowing of the lateral recesses right more than left. No definite neural compression. L3-4: Endplate osteophytes and bulging of the disc. Mild facet and ligamentous hypertrophy. Bilateral lateral recess stenosis that could possibly cause neural compression. L4-5: Previous right hemilaminectomy. 2 mm anterolisthesis because of facet osteoarthritis. Endplate osteophytes and minimal bulging of the disc. Mild stenosis of the left lateral recess. L5-S1: Left  posterolateral to foraminal disc herniation that could compress the left L5 nerve root. Mild facet and ligamentous hypertrophy with narrowing of the subarticular lateral recesses left more than right. No finding to suggest spinal or paraspinal infection. IMPRESSION: No finding to suggest spinal or paraspinal infection. Curvature convex to the left with the apex at L3. Straightening of the normal lumbar lordosis. 2 mm anterolisthesis L4-5. Degenerative disc disease and degenerative facet disease. Narrowing of the lateral recesses could cause neural compression on the right at L2-3, bilaterally at L3-4 and on the left at L4-5. At L5-S1, there is a left foraminal disc herniation which would likely compress the left L5 nerve root. Mild narrowing of the subarticular lateral recess on the left. Electronically Signed   By: Nelson Chimes M.D.   On: 12/10/2016 13:55   Ct Maxillofacial W Contrast  Result Date: 12/09/2016 CLINICAL DATA:  Altered mental status worsening for 3 days. Fever, dental infection. Status post multiple dental extractions February and March. Status post biopsy demonstrating reported osteomyelitis. History of cancer and radiation. EXAM: CT MAXILLOFACIAL WITH CONTRAST TECHNIQUE: Multidetector CT imaging of the maxillofacial structures was performed. Multiplanar CT image reconstructions were also generated. A small metallic BB was placed on the right temple in order to reliably differentiate right from left. COMPARISON:  None. FINDINGS: OSSEOUS: The mandible is intact, the condyles are located. No acute facial fracture. No destructive bony lesions. Absent maxillary teeth. Residual maxillary teeth demonstrate dental caries. Severe RIGHT temporomandibular osteoarthrosis. Small metallic foreign body within RIGHT posterior mandible dental socket with packing material. ORBITS: Ocular globes and orbital contents are normal. SINUSES: Paranasal sinuses are well aerated. Nasal septum is midline. Trace LEFT mastoid  effusion. SOFT TISSUES: No significant soft tissue swelling. No subcutaneous gas or radiopaque foreign bodies. Moderate calcific atherosclerosis of the carotid siphons. LIMITED INTRACRANIAL: Normal. IMPRESSION: Packing material and radiopaque foreign body within RIGHT mandible molar socket, recommend direct inspection. No CT findings of drainable fluid collection or acute process in the face. Electronically Signed   By: Elon Alas M.D.   On: 12/09/2016 06:35   Dg Chest Port 1 View  Result Date: 12/08/2016 CLINICAL DATA:  Acute onset of lethargy and confusion. Recent urinary tract infection. Initial encounter. EXAM: PORTABLE CHEST 1 VIEW COMPARISON:  Chest radiograph performed 11/13/2016 FINDINGS: The lungs are well-aerated. Mild bibasilar atelectasis or scarring is noted. Peribronchial thickening is seen. There is no evidence of  pleural effusion or pneumothorax. The cardiomediastinal silhouette is borderline enlarged. No acute osseous abnormalities are seen. IMPRESSION: Mild bibasilar atelectasis or scarring noted. Peribronchial thickening seen. Borderline cardiomegaly. Electronically Signed   By: Garald Balding M.D.   On: 12/08/2016 23:38     ELGERGAWY, DAWOOD M.D on 12/10/2016 at 4:44 PM  Between 7am to 7pm - Pager - [phone]  After 7pm go to [email or web address] - password [email or web address]  [facility] -  Office  [phone]

## 2016-12-10 NOTE — Progress Notes (Signed)
Patient now awake orientation level at baseline.This nurse told patient had hard time waking her up earlier.Patient stated ," I'm hard sleeper.It takes me a while to wake up."Joy Moron NP on nursing unit.Informed her patient now awake .Will continue to monitor.

## 2016-12-10 NOTE — Progress Notes (Signed)
Plan to obtain labs and continue to monitor patient for now.

## 2016-12-10 NOTE — Progress Notes (Signed)
RN paged earlier in shift because pt was more lethargic. She did respond but immediately fell back asleep. Unable to give hs meds due to lethargy. NP ordered labs. Before labs back, RN told NP that upon reassessment, pt awake and responsive. Pt states "when I get in a heavy sleep, I'm knocked out".  K+ 3.3, will replace. Mg++ normal WBCC improving Ammonia 10 LA 0.8 KJKG, NP [facility]

## 2016-12-10 NOTE — Progress Notes (Signed)
PT Cancellation Note  Patient Details Name: [name] MRN: [id number] DOB: [birth year]   Cancelled Treatment:    Reason Eval/Treat Not Completed: Patient at procedure or test/unavailable MRI   Alayssa Flinchum,KATHrine E 12/10/2016, 1:13 PM Carmelia Bake, PT, DPT 12/10/2016 Pager: [phone]

## 2016-12-11 DIAGNOSIS — Z9889 Other specified postprocedural states: Secondary | ICD-10-CM

## 2016-12-11 DIAGNOSIS — D509 Iron deficiency anemia, unspecified: Secondary | ICD-10-CM

## 2016-12-11 DIAGNOSIS — Z8673 Personal history of transient ischemic attack (TIA), and cerebral infarction without residual deficits: Secondary | ICD-10-CM

## 2016-12-11 LAB — GLUCOSE, CAPILLARY
GLUCOSE-CAPILLARY: 104 mg/dL — AB (ref 65–99)
GLUCOSE-CAPILLARY: 145 mg/dL — AB (ref 65–99)
GLUCOSE-CAPILLARY: 153 mg/dL — AB (ref 65–99)
Glucose-Capillary: 142 mg/dL — ABNORMAL HIGH (ref 65–99)

## 2016-12-11 LAB — CBC
HEMATOCRIT: 29.5 % — AB (ref 36.0–46.0)
HEMOGLOBIN: 9.3 g/dL — AB (ref 12.0–15.0)
MCH: 29 pg (ref 26.0–34.0)
MCHC: 31.5 g/dL (ref 30.0–36.0)
MCV: 91.9 fL (ref 78.0–100.0)
Platelets: 303 10*3/uL (ref 150–400)
RBC: 3.21 MIL/uL — ABNORMAL LOW (ref 3.87–5.11)
RDW: 13.2 % (ref 11.5–15.5)
WBC: 9.7 10*3/uL (ref 4.0–10.5)

## 2016-12-11 LAB — BASIC METABOLIC PANEL
ANION GAP: 8 (ref 5–15)
BUN: 7 mg/dL (ref 6–20)
CHLORIDE: 108 mmol/L (ref 101–111)
CO2: 21 mmol/L — AB (ref 22–32)
Calcium: 8.1 mg/dL — ABNORMAL LOW (ref 8.9–10.3)
Creatinine, Ser: 0.78 mg/dL (ref 0.44–1.00)
GFR calc non Af Amer: >60 mL/min (ref >60)
GLUCOSE: 148 mg/dL — AB (ref 65–99)
Potassium: 3.3 mmol/L — ABNORMAL LOW (ref 3.5–5.1)
Sodium: 137 mmol/L (ref 135–145)

## 2016-12-11 MED ORDER — CEPHALEXIN 250 MG PO CAPS
250.0000 mg | ORAL_CAPSULE | Freq: Three times a day (TID) | ORAL | 0 refills | Status: AC
Start: 2016-12-11 — End: 2016-12-18

## 2016-12-11 NOTE — Evaluation (Signed)
Physical Therapy Evaluation Patient Details Name: [name] MRN: [id number] DOB: October 29, 1931 Today's Date: 12/11/2016   History of Present Illness  83 y.o. female with medical history significant for diabetes mellitus 2 on oral agents, hypertension, remote history of CVA, CK D3, dyslipidemia and known HNP status post discectomy in March 2016. Patient had several dental extractions performed in February March 2018. Post procedure she had persistent dental pain that was initially felt due to "dry socket". Eventual evaluation revealed infectious concerns in lead to a final diagnosis of osteomyelitis. She was treated with antibiotics by her dentist (Dr. Jonetta Osgood) which have been completed several weeks ago. Plan was to pursue outpatient hyperbaric oxygen treatment at the wound care center in [place] beginning last week the patient was unable to attend secondary to issues related to back pain, she was admitted with sepsis  Clinical Impression  Pt admitted for problem above with deficits below. PTA, pt was ambulating with standard walker secondary to LLE pain. Upon evaluation, pt continues to experience LLE pain as well as weakness and decreased balance. Required supervision for mobility tasks. Pt reports she is already receiving HHPT at home and recommend continuing those services. Will continue to follow acutely to maximize functional mobility independence.     Follow Up Recommendations Supervision/Assistance - 24 hour;Other (comment);Home health PT (Restart HHPT )    Equipment Recommendations  Rolling walker with 5" wheels    Recommendations for Other Services       Precautions / Restrictions Precautions Precautions: None Restrictions Weight Bearing Restrictions: No      Mobility  Bed Mobility               General bed mobility comments: Sitting EOB upon entry   Transfers Overall transfer level: Modified independent Equipment used: Rolling walker (2 wheeled)                 Ambulation/Gait Ambulation/Gait assistance: Supervision Ambulation Distance (Feet): 50 Feet Assistive device: Rolling walker (2 wheeled) Gait Pattern/deviations: Step-through pattern;Decreased weight shift to left;Antalgic;Trunk flexed Gait velocity: Decreased Gait velocity interpretation: Below normal speed for age/gender General Gait Details: Slow, antalgic gait secondary to LLE pain. Pt reports this is baseline for her and has been using standard walker at home. Reports RW is easier to use. Distance limited secondary to pain.   Stairs            Wheelchair Mobility    Modified Rankin (Stroke Patients Only)       Balance Overall balance assessment: Needs assistance Sitting-balance support: No upper extremity supported;Feet supported Sitting balance-Leahy Scale: Good     Standing balance support: Bilateral upper extremity supported;During functional activity Standing balance-Leahy Scale: Poor Standing balance comment: Reliant on RW for stability                              Pertinent Vitals/Pain Pain Assessment: 0-10 Pain Score: 7  Pain Location: back Pain Descriptors / Indicators: Constant Pain Intervention(s): Limited activity within patient's tolerance;Monitored during session;Repositioned    Home Living Family/patient expects to be discharged to:: Private residence Living Arrangements: Alone (daughter lives behind ) Available Help at Discharge: Family;Available 24 hours/day Type of Home: Mobile home Home Access: Stairs to enter Entrance Stairs-Rails: Right;Left;Can reach both Entrance Stairs-Number of Steps: 3 Home Layout: One level Home Equipment: Walker - standard;Cane - single point;Adaptive equipment;Hand held shower head Additional Comments: Pt's daughter lives right behind her and plans to alternate with  other daughter staying with her as needed    Prior Function Level of Independence: Independent with assistive device(s)          Comments: reports recently using walker due to back pain     Hand Dominance   Dominant Hand: Right    Extremity/Trunk Assessment   Upper Extremity Assessment Upper Extremity Assessment: Defer to OT evaluation    Lower Extremity Assessment Lower Extremity Assessment: LLE deficits/detail LLE Deficits / Details: Numbness in L leg at baseline with burning down into leg. LLE weakness at least 3/5 throughout.     Cervical / Trunk Assessment Cervical / Trunk Assessment: Kyphotic  Communication   Communication: HOH  Cognition Arousal/Alertness: Awake/alert Behavior During Therapy: WFL for tasks assessed/performed Overall Cognitive Status: Within Functional Limits for tasks assessed                                        General Comments General comments (skin integrity, edema, etc.): Pt's daughter present throughout session. Reports she is receiving HHPT presently.     Exercises     Assessment/Plan    PT Assessment Patient needs continued PT services  PT Problem List Decreased strength;Decreased activity tolerance;Decreased balance;Decreased mobility;Decreased knowledge of use of DME;Pain       PT Treatment Interventions DME instruction;Gait training;Stair training;Functional mobility training;Therapeutic activities;Therapeutic exercise;Balance training;Neuromuscular re-education;Patient/family education    PT Goals (Current goals can be found in the Care Plan section)  Acute Rehab PT Goals Patient Stated Goal: to go home today PT Goal Formulation: With patient Time For Goal Achievement: 12/18/16 Potential to Achieve Goals: Good    Frequency Min 3X/week   Barriers to discharge        Co-evaluation               AM-PAC PT "6 Clicks" Daily Activity  Outcome Measure Difficulty turning over in bed (including adjusting bedclothes, sheets and blankets)?: A Little Difficulty moving from lying on back to sitting on the side of the bed? : A  Little Difficulty sitting down on and standing up from a chair with arms (e.g., wheelchair, bedside commode, etc,.)?: None Help needed moving to and from a bed to chair (including a wheelchair)?: None Help needed walking in hospital room?: None Help needed climbing 3-5 steps with a railing? : A Little 6 Click Score: 21    End of Session Equipment Utilized During Treatment: Gait belt Activity Tolerance: Patient limited by pain Patient left: in bed;with call bell/phone within reach (sitting EOB ) Nurse Communication: Mobility status PT Visit Diagnosis: Other abnormalities of gait and mobility (R26.89);Pain Pain - Right/Left: Left Pain - part of body: Leg    Time: 6834-1962 PT Time Calculation (min) (ACUTE ONLY): 21 min   Charges:   PT Evaluation $PT Eval Low Complexity: 1 Procedure PT Treatments $Gait Training: 8-22 mins   PT G Codes:        Nicky Pugh, PT, DPT  Acute Rehabilitation Services  Pager: [phone]   Army Melia 12/11/2016, 1:38 PM

## 2016-12-11 NOTE — Discharge Summary (Signed)
Physician Discharge Summary  [name]:[id number] DOB: Dec 30, 1931 DOA: 12/08/2016  PCP: [name], PA-C Oral Surgeon: Jerilynn Mages. [place] Neurosurgeon: [place]  Admit date: 12/08/2016 Discharge date: 12/11/2016  Admitted From: Home  Disposition:  Home   Recommendations for Outpatient Follow-up:  1. Follow up with PCP in 1weeks 2. Please obtain BMP/CBC in one week 3. Please follow up on the following pending results:  Home Health: YES Equipment/Devices: 3 in 1 commode, rolling walker with 5 inch wheels  Discharge Condition: STABLE  CODE STATUS: FULL   Brief/Interim Summary:  HPI: [name] is a 83 y.o. female with medical history significant for diabetes mellitus 2 on oral agents, hypertension, remote history of CVA, CK D3, dyslipidemia and known HNP status post discectomy in March 2016. Patient had several dental extractions performed in February March 2018. Post procedure she had persistent dental pain that was initially felt due to "dry socket". Eventual evaluation revealed infectious concerns in lead to a final diagnosis of osteomyelitis. She was treated with antibiotics by her dentist (Dr. Jonetta Osgood) which have been completed several weeks ago. Plan was to pursue outpatient hyperbaric oxygen treatment at the wound care center in [place] beginning last week the patient was unable to attend secondary to issues related to back pain. Also last week patient began having night sweats and what the family thought was fevers. She developed altered mental status with hallucinations and agitation prompting a visit to the [facility] emergency room last Thursday where patient was diagnosed with UTI and was started on Keflex. Because of persistent issues with lethargy, altered mental status and persistent back pain family brought patient in for further evaluation here in the ER. Of note attempts were made to pursue imaging of patient's back in the ER but because of uncontrolled  pain was felt she would not tolerate MRI scanning so this was not pursued immediately. Maxillofacial CT showed no evidence of fluid collection or abscess and only demonstrated expected findings of a small metallic foreign body within the right posterior mandible dental socket with packing material. 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  ED Course:  Vital Signs: BP (!) 144/67   Pulse (!) 106   Temp 100.2 F (37.9 C) (Rectal)   Resp 18   Wt 68.5 kg (151 lb)   SpO2 96%   BMI 27.62 kg/m  PCXR: No acute findings CT head without contrast: Neg Maxillofacial CT with contrast: Packing material and radiopaque foreign body within the right mandible molar socket with recommended direct inspection to clarify. No CT findings of drainable fluid collection or acute process in the face Lab data: Sodium 136, potassium 3.7, chloride 97, CO2 25, glucose 244, BUN 33, creatinine 1.14, anion gap 14, LFTs unremarkable, CRP 5.6, initial lactic acid 2.21 down to 1.76 after treatment, white count 18,000 with neutrophils 74% and absolute feels 13.4%, hemoglobin 12.8, platelets 402,000, sedimentation rate 72, Tylenol level less than 10, salicylate level less than 7, urinalysis unremarkable with borderline elevated specific gravity of 1.020, blood cultures and urine culture obtained in the ER Medications and treatments: Normal saline bolus 2.5 L, Zosyn 3.375 g IV 1, vancomycin 1 g IV 1, Tylenol 650 mg PR 1, fentanyl 50 g IV 1, Ativan 1 mg IV 1  Sepsis - Presents with altered mental status, leukocytosis, tachycardia, and elevated lactic acid. - Source of sepsis most likely related to infected oral surgical site as she had recent multiple tooth infection operated on with known mandibular osteomyelitis, very likely has  been infected when it was packed, as was significant malodorous discharge when packing  was removed yesterday, discussed today with patient oral surgeon Dr. Delma Officer , she is on Keflex for mandibular osteomyelitis, she should continue on discharge, no further packing needed currently, and she will follow her in outpatient setting, she has been arranging for hyperbaric oxygen treatment at wound care center in [place]. - Lumbar spine with no evidence of infection or discitis, blood culture remains negative to date, negative urine analysis(even though has been recently treated for UTI at [facility]), chest x-ray with no evidence of infection as well. - Discharge on oral cephalexin with follow up with oral surgeon.    History of oral surgery/osteomyelitis of the jaw - Please see above discussion -CT unremarkable except for expected dental packing in place - Pt asking for pain medication but reviewed [place] drug database and patient received Rx for hydrocodone 30 tabs on 12/06/16.    Lower back pain - Chronic secondary to lumbar radiculopathy, no evidence of infection : MRI lumbar spine with and without contrast, she follows with Dr. Sheryle Spray as an outpatient - see results below. Follow up with Dr. Sheryle Spray.    Hypertension - Continue with Coreg , blood pressure started to increase, so we'll resume amlodipine today, and [place]ik[place] will resume losartan/hydrochlorothiazide in 1-2 days.  Diabetes mellitus - Continue to hold metformin, continue with insulin sliding scale, hemoglobin A1c 7.2  HLD (hyperlipidemia) -Continue statin  Hypokalemia - Repleted   Code Status : Full  Family Communication  : D/W daughter at bedside  Disposition Plan  : Home with Home Health services  Consults  :  None  Procedures  : none  DVT Prophylaxis  :  Lovenox   Discharge Diagnoses:  Principal Problem:   Sepsis ([place]) Active Problems:   Hypertension   Anemia   History of Lumbar herniated disc   HLD (hyperlipidemia)   History of CVA (cerebrovascular accident)   CKD (chronic kidney disease), stage III   Back pain   History of oral surgery    Osteomyelitis of jaw   Diabetes mellitus type 2, uncontrolled ([place])    Discharge Instructions  Discharge Instructions    Increase activity slowly    Complete by:  As directed      Allergies as of 12/11/2016      Reactions   Sulfa Antibiotics Swelling   Mouth and tongue swelling      Medication List    STOP taking these medications   feeding supplement (ENSURE COMPLETE) Liqd   oxyCODONE-acetaminophen 5-325 MG tablet Commonly known as:  PERCOCET/ROXICET     TAKE these medications   amLODipine 5 MG tablet Commonly known as:  NORVASC Take 5 mg by mouth daily.   atorvastatin 40 MG tablet Commonly known as:  LIPITOR Take 40 mg by mouth daily.   baclofen 10 MG tablet Commonly known as:  LIORESAL Take 10 mg by mouth 2 (two) times daily as needed for muscle spasms.   carvedilol 25 MG tablet Commonly known as:  COREG Take 25 mg by mouth 2 (two) times daily with a meal.   cephALEXin 250 MG capsule Commonly known as:  KEFLEX Take 1 capsule (250 mg total) by mouth 3 (three) times daily.   cyclobenzaprine 10 MG tablet Commonly known as:  FLEXERIL Take 1 tablet (10 mg total) by mouth 3 (three) times daily as needed for muscle spasms.   diclofenac 50 MG tablet Commonly known as:  CATAFLAM Take 50  mg by mouth 2 (two) times daily.   gabapentin 300 MG capsule Commonly known as:  NEURONTIN Take 300 mg by mouth 3 (three) times daily. What changed:  Another medication with the same name was removed. Continue taking this medication, and follow the directions you see here.   HYDROcodone-acetaminophen 5-325 MG tablet Commonly known as:  NORCO/VICODIN Take 1 tablet by mouth every 4 (four) hours as needed for moderate pain.   losartan-hydrochlorothiazide 100-25 MG tablet Commonly known as:  HYZAAR Take 1 tablet by mouth daily.   metFORMIN 500 MG 24 hr tablet Commonly known as:  GLUCOPHAGE-XR Take 500 mg by mouth daily with breakfast.   mirtazapine 30 MG tablet Commonly  known as:  REMERON Take 30 mg by mouth at bedtime.   ranitidine 150 MG tablet Commonly known as:  ZANTAC Take 150 mg by mouth daily.   traMADol-acetaminophen 37.5-325 MG tablet Commonly known as:  ULTRACET Take 2 tablets by mouth 4 (four) times daily as needed (pain).            Durable Medical Equipment        Start     Ordered   12/11/16 1342  For home use only DME Walker rolling  Once    Question Answer Comment  Patient needs a walker to treat with the following condition Gait instability   Patient needs a walker to treat with the following condition Chronic low back pain      12/11/16 1342   12/11/16 1211  For home use only DME Eelevated commode seat  Once     12/11/16 1210     Follow-up Information    [name], PA-C. Schedule an appointment as soon as possible for a visit in 1 week(s).   Specialty:  Physician Assistant Contact information: [place] [address] [phone]        Leeroy Cha, MD. Schedule an appointment as soon as possible for a visit in 1 week(s).   Specialty:  Neurosurgery Contact information: [address] [place] [address] [place]  [address] [phone]        Oral Surgeon. Schedule an appointment as soon as possible for a visit in 2 day(s).        Mliss Fritz, MD Follow up.   Specialty:  Internal Medicine Contact information: [place] [address] [phone]        Lynda Rainwater, [place]. Schedule an appointment as soon as possible for a visit in 5 day(s).   Specialty:  Oral Surgery Contact information: [place] [address] [phone]          Allergies  Allergen Reactions  . Sulfa Antibiotics Swelling    Mouth and tongue swelling   Procedures/Studies: Dg Lumbar Spine Complete  Result Date: 12/03/2016 CLINICAL DATA:  Increased back pain radiating into the left leg after lifting heavy plantar 2 days ago. The patient is  unable to lie supine for imaging and [place] images were obtained standing. History of previous back surgery. EXAM: LUMBAR SPINE - COMPLETE 4+ VIEW COMPARISON:  Lumbar spine series of November 16, 2015 and MRI of the lumbar spine of July 05, 2016 FINDINGS: There is chronic curvature convex toward the left centered at L3-4 which appears stable allowing for the standing positioning. The vertebral bodies are preserved in height. There is moderate disc space narrowing at all lumbar levels. There is no significant spondylolisthesis. Endplate osteophytes are present at multiple levels. There is  facet joint hypertrophy at L5-S1. The observed portions of the sacrum are normal. Is calcification in the wall of the abdominal aorta and iliac arteries. IMPRESSION: Moderate to severe disc space height loss at all lumbar levels. No acute compression fracture. Stable mild to moderate levocurvature centered at L3-4. If the patient's symptoms persist, repeat lumbar spine MRI would be useful. Electronically Signed   By: David  Martinique M.D.   On: 12/03/2016 14:42   Ct Head Wo Contrast  Result Date: 12/09/2016 CLINICAL DATA:  Fever and altered level of consciousness. EXAM: CT HEAD WITHOUT CONTRAST TECHNIQUE: Contiguous axial images were obtained from the base of the skull through the vertex without intravenous contrast. COMPARISON:  None. FINDINGS: Brain: Mild cerebral atrophy. No ventricular dilatation. Low-attenuation changes throughout the deep white matter likely representing small vessel ischemia. No mass effect or midline shift. No abnormal extra-axial fluid collections. Gray-white matter junctions are distinct. Basal cisterns are not effaced. No evidence of acute intracranial hemorrhage. Vascular: No hyperdense vessel or unexpected calcification. Skull: Normal. Negative for fracture or focal lesion. Sinuses/Orbits: No acute finding. Other: None. IMPRESSION: No acute intracranial abnormalities. Electronically Signed   By: Lucienne Capers M.D.   On: 12/09/2016 00:57   Mr Lumbar Spine W Wo Contrast  Result Date: 12/10/2016 CLINICAL DATA:  Low back pain. Chills and night sweats. Recent history of osteomyelitis of the jaw. Previous back surgery. EXAM: MRI LUMBAR SPINE WITHOUT AND WITH CONTRAST TECHNIQUE: Multiplanar and multiecho pulse sequences of the lumbar spine were obtained without and with intravenous contrast. CONTRAST:  15 cc MultiHance COMPARISON:  CT 12/05/2016 FINDINGS: Segmentation:  5 lumbar type vertebral bodies. Alignment: Curvature convex to the left with the apex at L3. Straightening the normal cervical lordosis. 2 mm anterolisthesis L4-5. Vertebrae: No fracture or primary bone lesion. Chronic discogenic marrow changes. Conus medullaris: Extends to the L1 level and appears normal. Paraspinal and other soft tissues: Negative except for renal cysts. Disc levels: No significant finding at T11-12 or T12-L1. L1-2: Endplate osteophytes and bulging of the disc. Mild narrowing of both lateral recesses without visible neural compression. L2-3: Endplate osteophytes and bulging of the disc. Mild facet hypertrophy on the right. Mild narrowing of the lateral recesses right more than left. No definite neural compression. L3-4: Endplate osteophytes and bulging of the disc. Mild facet and ligamentous hypertrophy. Bilateral lateral recess stenosis that could possibly cause neural compression. L4-5: Previous right hemilaminectomy. 2 mm anterolisthesis because of facet osteoarthritis. Endplate osteophytes and minimal bulging of the disc. Mild stenosis of the left lateral recess. L5-S1: Left posterolateral to foraminal disc herniation that could compress the left L5 nerve root. Mild facet and ligamentous hypertrophy with narrowing of the subarticular lateral recesses left more than right. No finding to suggest spinal or paraspinal infection. IMPRESSION: No finding to suggest spinal or paraspinal infection. Curvature convex to the left with the  apex at L3. Straightening of the normal lumbar lordosis. 2 mm anterolisthesis L4-5. Degenerative disc disease and degenerative facet disease. Narrowing of the lateral recesses could cause neural compression on the right at L2-3, bilaterally at L3-4 and on the left at L4-5. At L5-S1, there is a left foraminal disc herniation which would likely compress the left L5 nerve root. Mild narrowing of the subarticular lateral recess on the left. Electronically Signed   By: Nelson Chimes M.D.   On: 12/10/2016 13:55   Ct Maxillofacial W Contrast  Result Date: 12/09/2016 CLINICAL DATA:  Altered mental status worsening for 3 days. Fever, dental  infection. Status post multiple dental extractions February and March. Status post biopsy demonstrating reported osteomyelitis. History of cancer and radiation. EXAM: CT MAXILLOFACIAL WITH CONTRAST TECHNIQUE: Multidetector CT imaging of the maxillofacial structures was performed. Multiplanar CT image reconstructions were also generated. A small metallic BB was placed on the right temple in order to reliably differentiate right from left. COMPARISON:  None. FINDINGS: OSSEOUS: The mandible is intact, the condyles are located. No acute facial fracture. No destructive bony lesions. Absent maxillary teeth. Residual maxillary teeth demonstrate dental caries. Severe RIGHT temporomandibular osteoarthrosis. Small metallic foreign body within RIGHT posterior mandible dental socket with packing material. ORBITS: Ocular globes and orbital contents are normal. SINUSES: Paranasal sinuses are well aerated. Nasal septum is midline. Trace LEFT mastoid effusion. SOFT TISSUES: No significant soft tissue swelling. No subcutaneous gas or radiopaque foreign bodies. Moderate calcific atherosclerosis of the carotid siphons. LIMITED INTRACRANIAL: Normal. IMPRESSION: Packing material and radiopaque foreign body within RIGHT mandible molar socket, recommend direct inspection. No CT findings of drainable fluid  collection or acute process in the face. Electronically Signed   By: Elon Alas M.D.   On: 12/09/2016 06:35   Dg Chest Port 1 View  Result Date: 12/08/2016 CLINICAL DATA:  Acute onset of lethargy and confusion. Recent urinary tract infection. Initial encounter. EXAM: PORTABLE CHEST 1 VIEW COMPARISON:  Chest radiograph performed 11/13/2016 FINDINGS: The lungs are well-aerated. Mild bibasilar atelectasis or scarring is noted. Peribronchial thickening is seen. There is no evidence of pleural effusion or pneumothorax. The cardiomediastinal silhouette is borderline enlarged. No acute osseous abnormalities are seen. IMPRESSION: Mild bibasilar atelectasis or scarring noted. Peribronchial thickening seen. Borderline cardiomegaly. Electronically Signed   By: Garald Balding M.D.   On: 12/08/2016 23:38   (Echo, Carotid, EGD, Colonoscopy, ERCP)   Subjective: Pt says she feels much better, wanting to discharge today, says she is scheduled to follow up with oral surgeon for more treatment already  Discharge Exam: Vitals:   12/11/16 0600 12/11/16 0826  BP:  (!) 164/70  Pulse:    Resp: 18   Temp:     Vitals:   12/11/16 0500 12/11/16 0516 12/11/16 0600 12/11/16 0826  BP:  (!) 156/77  (!) 164/70  Pulse:  88    Resp:  (!) 24 18   Temp:  99.9 F (37.7 C)    TempSrc:  Oral    SpO2:  91% 93%   Weight: 69 kg (152 lb 1.9 oz)     Height:       General: Pt is alert, awake, not in acute distress Cardiovascular: RRR, S1/S2 +, no rubs, no gallops Respiratory: CTA bilaterally, no wheezing, no rhonchi Abdominal: Soft, NT, ND, bowel sounds + Extremities: no edema, no cyanosis  The results of significant diagnostics from this hospitalization (including imaging, microbiology, ancillary and laboratory) are listed below for reference.     Microbiology: Recent Results (from the past 240 hour(s))  Blood Culture (routine x 2)     Status: None (Preliminary result)   Collection Time: 12/08/16 11:46 PM   Result Value Ref Range Status   Specimen Description BLOOD RIGHT ANTECUBITAL  Final   Special Requests   Final    BOTTLES DRAWN AEROBIC AND ANAEROBIC Blood Culture adequate volume   Culture NO GROWTH 2 DAYS  Final   Report Status PENDING  Incomplete  Blood Culture (routine x 2)     Status: None (Preliminary result)   Collection Time: 12/08/16 11:50 PM  Result Value Ref Range Status  Specimen Description BLOOD LEFT HAND  Final   Special Requests IN PEDIATRIC BOTTLE Blood Culture adequate volume  Final   Culture NO GROWTH 2 DAYS  Final   Report Status PENDING  Incomplete  Urine culture     Status: None   Collection Time: 12/09/16 12:16 AM  Result Value Ref Range Status   Specimen Description URINE, CATHETERIZED  Final   Special Requests NONE  Final   Culture NO GROWTH  Final   Report Status 12/10/2016 FINAL  Final     Labs: BNP (last 3 results) No results for input(s): BNP in the last 8760 hours. Basic Metabolic Panel:  Recent Labs Lab 12/08/16 2350 12/09/16 1028 12/10/16 0001 12/10/16 0625 12/11/16 0808  NA 136  --  139 138 137  K 3.7  --  3.3* 3.3* 3.3*  CL 97*  --  108 107 108  CO2 25  --  23 23 21*  GLUCOSE 244*  --  136* 117* 148*  BUN 33*  --  _0 CREATININE 1.14*  --  0.84 0.87 0.78  CALCIUM 9.6  --  8.2* 8.0* 8.1*  MG  --  1.6* 2.3 2.1  --   PHOS  --  2.3*  --   --   --    Liver Function Tests:  Recent Labs Lab 12/08/16 2350 12/10/16 0625  AST 23 18  ALT 17 13*  ALKPHOS 58 41  BILITOT 0.5 0.5  PROT 8.4* 6.3*  ALBUMIN 3.7 2.7*    Recent Labs Lab 12/08/16 2350  LIPASE 42    Recent Labs Lab 12/10/16 0001  AMMONIA 10   CBC:  Recent Labs Lab 12/08/16 2350 12/10/16 0001 12/10/16 0625 12/11/16 0808  WBC 18.0* 11.4* 9.6 9.7  NEUTROABS 13.4* 7.3  --   --   HGB 12.8 9.7* 9.2* 9.3*  HCT 39.7 29.4* 28.9* 29.5*  MCV 92.3 92.5 92.6 91.9  PLT 402* 311 301 303   Cardiac Enzymes: No results for input(s): CKTOTAL, CKMB, CKMBINDEX,  TROPONINI in the last 168 hours. BNP: Invalid input(s): POCBNP CBG:  Recent Labs Lab 12/10/16 1955 12/11/16 0008 12/11/16 0353 12/11/16 0756 12/11/16 1228  GLUCAP 111* 153* 104* 142* 145*   D-Dimer No results for input(s): DDIMER in the last 72 hours. Hgb A1c  Recent Labs  12/09/16 1303  HGBA1C 7.2*   Lipid Profile No results for input(s): CHOL, HDL, LDLCALC, TRIG, CHOLHDL, LDLDIRECT in the last 72 hours. Thyroid function studies No results for input(s): TSH, T4TOTAL, T3FREE, THYROIDAB in the last 72 hours.  Invalid input(s): FREET3 Anemia work up No results for input(s): VITAMINB12, FOLATE, FERRITIN, TIBC, IRON, RETICCTPCT in the last 72 hours. Urinalysis    Component Value Date/Time   COLORURINE YELLOW 12/09/2016 0016   APPEARANCEUR CLEAR 12/09/2016 0016   LABSPEC 1.020 12/09/2016 0016   PHURINE 6.0 12/09/2016 0016   GLUCOSEU NEGATIVE 12/09/2016 0016   HGBUR NEGATIVE 12/09/2016 0016   BILIRUBINUR NEGATIVE 12/09/2016 0016   KETONESUR NEGATIVE 12/09/2016 0016   PROTEINUR NEGATIVE 12/09/2016 0016   UROBILINOGEN 1.0 04/04/2012 1310   NITRITE NEGATIVE 12/09/2016 0016   LEUKOCYTESUR NEGATIVE 12/09/2016 0016   Sepsis Labs Invalid input(s): PROCALCITONIN,  WBC,  LACTICIDVEN Microbiology Recent Results (from the past 240 hour(s))  Blood Culture (routine x 2)     Status: None (Preliminary result)   Collection Time: 12/08/16 11:46 PM  Result Value Ref Range Status   Specimen Description BLOOD RIGHT ANTECUBITAL  Final   Special Requests  Final    BOTTLES DRAWN AEROBIC AND ANAEROBIC Blood Culture adequate volume   Culture NO GROWTH 2 DAYS  Final   Report Status PENDING  Incomplete  Blood Culture (routine x 2)     Status: None (Preliminary result)   Collection Time: 12/08/16 11:50 PM  Result Value Ref Range Status   Specimen Description BLOOD LEFT HAND  Final   Special Requests IN PEDIATRIC BOTTLE Blood Culture adequate volume  Final   Culture NO GROWTH 2 DAYS   Final   Report Status PENDING  Incomplete  Urine culture     Status: None   Collection Time: 12/09/16 12:16 AM  Result Value Ref Range Status   Specimen Description URINE, CATHETERIZED  Final   Special Requests NONE  Final   Culture NO GROWTH  Final   Report Status 12/10/2016 FINAL  Final   Time coordinating discharge: 32 minutes  SIGNED:  Irwin Brakeman, MD  [facility] 12/11/2016, 1:53 PM Pager [phone]  If 7PM-7AM, please contact night-coverage [email or web address] Password [email or web address]

## 2016-12-11 NOTE — Discharge Instructions (Signed)
Back Injury Prevention Back injuries can be very painful. They can also be difficult to heal. After having one back injury, you are more likely to injure your back again. It is important to learn how to avoid injuring or re-injuring your back. The following tips can help you to prevent a back injury. What should I know about physical fitness?  Exercise for 30 minutes per day on most days of the week or as directed by your health care provider. Make sure to:  Do aerobic exercises, such as walking, jogging, biking, or swimming.  Do exercises that increase balance and strength, such as tai chi and yoga. These can decrease your risk of falling and injuring your back.  Do stretching exercises to help with flexibility.  Try to develop strong abdominal muscles. Your abdominal muscles provide a lot of the support that is needed by your back.  Maintain a healthy weight. This helps to decrease your risk of a back injury. What should I know about my diet?  Talk with your health care provider about your overall diet. Take supplements and vitamins only as directed by your health care provider.  Talk with your health care provider about how much calcium and vitamin D you need each day. These nutrients help to prevent weakening of the bones (osteoporosis). Osteoporosis can cause broken (fractured) bones, which lead to back pain.  Include good sources of calcium in your diet, such as dairy products, green leafy vegetables, and products that have had calcium added to them (fortified).  Include good sources of vitamin D in your diet, such as milk and foods that are fortified with vitamin D. What should I know about my posture?  Sit up straight and stand up straight. Avoid leaning forward when you sit or hunching over when you stand.  Choose chairs that have good low-back (lumbar) support.  If you work at a desk, sit close to it so you do not need to lean over. Keep your chin tucked in. Keep your neck  drawn back, and keep your elbows bent at a right angle. Your arms should look like the letter "L."  Sit high and close to the steering wheel when you drive. Add a lumbar support to your car seat, if needed.  Avoid sitting or standing in one position for very long. Take breaks to get up, stretch, and walk around at least one time every hour. Take breaks every hour if you are driving for long periods of time.  Sleep on your side with your knees slightly bent, or sleep on your back with a pillow under your knees. Do not lie on the front of your body to sleep. What should I know about lifting, twisting, and reaching? Lifting and Heavy Lifting    Avoid heavy lifting, especially repetitive heavy lifting. If you must do heavy lifting:  Stretch before lifting.  Work slowly.  Rest between lifts.  Use a tool such as a cart or a dolly to move objects if one is available.  Make several small trips instead of carrying one heavy load.  Ask for help when you need it, especially when moving big objects.  Follow these steps when lifting:  Stand with your feet shoulder-width apart.  Get as close to the object as you can. Do not try to pick up a heavy object that is far from your body.  Use handles or lifting straps if they are available.  Bend at your knees. Squat down, but keep your heels off  the floor.  Keep your shoulders pulled back, your chin tucked in, and your back straight.  Lift the object slowly while you tighten the muscles in your legs, abdomen, and buttocks. Keep the object as close to the center of your body as possible.  Follow these steps when putting down a heavy load:  Stand with your feet shoulder-width apart.  Lower the object slowly while you tighten the muscles in your legs, abdomen, and buttocks. Keep the object as close to the center of your body as possible.  Keep your shoulders pulled back, your chin tucked in, and your back straight.  Bend at your knees. Squat  down, but keep your heels off the floor.  Use handles or lifting straps if they are available. Twisting and Reaching   Avoid lifting heavy objects above your waist.  Do not twist at your waist while you are lifting or carrying a load. If you need to turn, move your feet.  Do not bend over without bending at your knees.  Avoid reaching over your head, across a table, or for an object on a high surface. What are some other tips?  Avoid wet floors and icy ground. Keep sidewalks clear of ice to prevent falls.  Do not sleep on a mattress that is too soft or too hard.  Keep items that are used frequently within easy reach.  Put heavier objects on shelves at waist level, and put lighter objects on lower or higher shelves.  Find ways to decrease your stress, such as exercise, massage, or relaxation techniques. Stress can build up in your muscles. Tense muscles are more vulnerable to injury.  Talk with your health care provider if you feel anxious or depressed. These conditions can make back pain worse.  Wear flat heel shoes with cushioned soles.  Avoid sudden movements.  Use both shoulder straps when carrying a backpack.  Do not use any tobacco products, including cigarettes, chewing tobacco, or electronic cigarettes. If you need help quitting, ask your health care provider. This information is not intended to replace advice given to you by your health care provider. Make sure you discuss any questions you have with your health care provider. Document Released: 08/29/2004 Document Revised: 12/28/2015 Document Reviewed: 07/26/2014 Elsevier Interactive Patient Education  2017 [employer]   Back Pain, Adult Back pain is very common. The pain often gets better over time. The cause of back pain is usually not dangerous. Most people can learn to manage their back pain on their own. Follow these instructions at home: Watch your back pain for any changes. The following actions may help to  lessen any pain you are feeling:  Stay active. Start with short walks on flat ground if you can. Try to walk farther each day.  Exercise regularly as told by your doctor. Exercise helps your back heal faster. It also helps avoid future injury by keeping your muscles strong and flexible.  Do not sit, drive, or stand in one place for more than 30 minutes.  Do not stay in bed. Resting more than 1-2 days can slow down your recovery.  Be careful when you bend or lift an object. Use good form when lifting:  Bend at your knees.  Keep the object close to your body.  Do not twist.  Sleep on a firm mattress. Lie on your side, and bend your knees. If you lie on your back, put a pillow under your knees.  Take medicines only as told by your doctor.  Put ice on the injured area.  Put ice in a plastic bag.  Place a towel between your skin and the bag.  Leave the ice on for 20 minutes, 2-3 times a day for the first 2-3 days. After that, you can switch between ice and heat packs.  Avoid feeling anxious or stressed. Find good ways to deal with stress, such as exercise.  Maintain a healthy weight. Extra weight puts stress on your back. Contact a doctor if:  You have pain that does not go away with rest or medicine.  You have worsening pain that goes down into your legs or buttocks.  You have pain that does not get better in one week.  You have pain at night.  You lose weight.  You have a fever or chills. Get help right away if:  You cannot control when you poop (bowel movement) or pee (urinate).  Your arms or legs feel weak.  Your arms or legs lose feeling (numbness).  You feel sick to your stomach (nauseous) or throw up (vomit).  You have belly (abdominal) pain.  You feel like you may pass out (faint). This information is not intended to replace advice given to you by your health care provider. Make sure you discuss any questions you have with your health care  provider. Document Released: 01/08/2008 Document Revised: 12/28/2015 Document Reviewed: 11/23/2013 Elsevier Interactive Patient Education  2017 [employer] Inc[employer]   Dehydration, Adult Dehydration is a condition in which there is not enough fluid or water in the body. This happens when you lose more fluids than you take in. Important organs, such as the kidneys, brain, and heart, cannot function without a proper amount of fluids. Any loss of fluids from the body can lead to dehydration. Dehydration can range from mild to severe. This condition should be treated right away to prevent it from becoming severe. What are the causes? This condition may be caused by:  Vomiting.  Diarrhea.  Excessive sweating, such as from heat exposure or exercise.  Not drinking enough fluid, especially:  When ill.  While doing activity that requires a lot of energy.  Excessive urination.  Fever.  Infection.  Certain medicines, such as medicines that cause the body to lose excess fluid (diuretics).  Inability to access safe drinking water.  Reduced physical ability to get adequate water and food. What increases the risk? This condition is more likely to develop in people:  Who have a poorly controlled long-term (chronic) illness, such as diabetes, heart disease, or kidney disease.  Who are age 13 or older.  Who are disabled.  Who live in a place with high altitude.  Who play endurance sports. What are the signs or symptoms? Symptoms of mild dehydration may include:   Thirst.  Dry lips.  Slightly dry mouth.  Dry, warm skin.  Dizziness. Symptoms of moderate dehydration may include:   Very dry mouth.  Muscle cramps.  Dark urine. Urine may be the color of tea.  Decreased urine production.  Decreased tear production.  Heartbeat that is irregular or faster than normal (palpitations).  Headache.  Light-headedness, especially when you stand up from a sitting position.  Fainting  (syncope). Symptoms of severe dehydration may include:   Changes in skin, such as:  Cold and clammy skin.  Blotchy (mottled) or pale skin.  Skin that does not quickly return to normal after being lightly pinched and released (poor skin turgor).  Changes in body fluids, such as:  Extreme thirst.  No  tear production.  Inability to sweat when body temperature is high, such as in hot weather.  Very little urine production.  Changes in vital signs, such as:  Weak pulse.  Pulse that is more than 100 beats a minute when sitting still.  Rapid breathing.  Low blood pressure.  Other changes, such as:  Sunken eyes.  Cold hands and feet.  Confusion.  Lack of energy (lethargy).  Difficulty waking up from sleep.  Short-term weight loss.  Unconsciousness. How is this diagnosed? This condition is diagnosed based on your symptoms and a physical exam. Blood and urine tests may be done to help confirm the diagnosis. How is this treated? Treatment for this condition depends on the severity. Mild or moderate dehydration can often be treated at home. Treatment should be started right away. Do not wait until dehydration becomes severe. Severe dehydration is an emergency and it needs to be treated in a hospital. Treatment for mild dehydration may include:   Drinking more fluids.  Replacing salts and minerals in your blood (electrolytes) that you may have lost. Treatment for moderate dehydration may include:   Drinking an oral rehydration solution (ORS). This is a drink that helps you replace fluids and electrolytes (rehydrate). It can be found at pharmacies and retail stores. Treatment for severe dehydration may include:   Receiving fluids through an IV tube.  Receiving an electrolyte solution through a feeding tube that is passed through your nose and into your stomach (nasogastric tube, or NG tube).  Correcting any abnormalities in electrolytes.  Treating the underlying  cause of dehydration. Follow these instructions at home:  If directed by your health care provider, drink an ORS:  Make an ORS by following instructions on the package.  Start by drinking small amounts, about  cup (120 mL) every 5-10 minutes.  Slowly increase how much you drink until you have taken the amount recommended by your health care provider.  Drink enough clear fluid to keep your urine clear or pale yellow. If you were told to drink an ORS, finish the ORS first, then start slowly drinking other clear fluids. Drink fluids such as:  Water. Do not drink only water. Doing that can lead to having too little salt (sodium) in the body (hyponatremia).  Ice chips.  Fruit juice that you have added water to (diluted fruit juice).  Low-calorie sports drinks.  Avoid:  Alcohol.  Drinks that contain a lot of sugar. These include high-calorie sports drinks, fruit juice that is not diluted, and soda.  Caffeine.  Foods that are greasy or contain a lot of fat or sugar.  Take over-the-counter and prescription medicines only as told by your health care provider.  Do not take sodium tablets. This can lead to having too much sodium in the body (hypernatremia).  Eat foods that contain a healthy balance of electrolytes, such as bananas, oranges, potatoes, tomatoes, and spinach.  Keep all follow-up visits as told by your health care provider. This is important. Contact a health care provider if:  You have abdominal pain that:  Gets worse.  Stays in one area (localizes).  You have a rash.  You have a stiff neck.  You are more irritable than usual.  You are sleepier or more difficult to wake up than usual.  You feel weak or dizzy.  You feel very thirsty.  You have urinated only a small amount of very dark urine over 6-8 hours. Get help right away if:  You have symptoms of severe  dehydration.  You cannot drink fluids without vomiting.  Your symptoms get worse with  treatment.  You have a fever.  You have a severe headache.  You have vomiting or diarrhea that:  Gets worse.  Does not go away.  You have blood or green matter (bile) in your vomit.  You have blood in your stool. This may cause stool to look black and tarry.  You have not urinated in 6-8 hours.  You faint.  Your heart rate while sitting still is over 100 beats a minute.  You have trouble breathing. This information is not intended to replace advice given to you by your health care provider. Make sure you discuss any questions you have with your health care provider. Document Released: 07/22/2005 Document Revised: 02/16/2016 Document Reviewed: 09/15/2015 Elsevier Interactive Patient Education  2017 [employer].   Chronic Back Pain When back pain lasts longer than 3 months, it is called chronic back pain.The cause of your back pain may not be known. Some common causes include:  Wear and tear (degenerative disease) of the bones, ligaments, or disks in your back.  Inflammation and stiffness in your back (arthritis). People who have chronic back pain often go through certain periods in which the pain is more intense (flare-ups). Many people can learn to manage the pain with home care. Follow these instructions at home: Pay attention to any changes in your symptoms. Take these actions to help with your pain: Activity   Avoid bending and activities that make the problem worse.  Do not sit or stand in one place for long periods of time.  Take brief periods of rest throughout the day. This will reduce your pain. Resting in a lying or standing position is usually better than sitting to rest.  When you are resting for longer periods, mix in some mild activity or stretching between periods of rest. This will help to prevent stiffness and pain.  Get regular exercise. Ask your health care provider what activities are safe for you.  Do not lift anything that is heavier than 10 lb  (4.5 kg). Always use proper lifting technique, which includes:  Bending your knees.  Keeping the load close to your body.  Avoiding twisting. Managing pain   If directed, apply ice to the painful area. Your health care provider may recommend applying ice during the first 24-48 hours after a flare-up begins.  Put ice in a plastic bag.  Place a towel between your skin and the bag.  Leave the ice on for 20 minutes, 2-3 times per day.  After icing, apply heat to the affected area as often as told by your health care provider. Use the heat source that your health care provider recommends, such as a moist heat pack or a heating pad.  Place a towel between your skin and the heat source.  Leave the heat on for 20-30 minutes.  Remove the heat if your skin turns bright red. This is especially important if you are unable to feel pain, heat, or cold. You may have a greater risk of getting burned.  Try soaking in a warm tub.  Take over-the-counter and prescription medicines only as told by your health care provider.  Keep all follow-up visits as told by your health care provider. This is important. Contact a health care provider if:  You have pain that is not relieved with rest or medicine. Get help right away if:  You have weakness or numbness in one or both of  your legs or feet.  You have trouble controlling your bladder or your bowels.  You have nausea or vomiting.  You have pain in your abdomen.  You have shortness of breath or you faint. This information is not intended to replace advice given to you by your health care provider. Make sure you discuss any questions you have with your health care provider. Document Released: 08/29/2004 Document Revised: 11/30/2015 Document Reviewed: 01/09/2015 Elsevier Interactive Patient Education  2017 [employer].

## 2016-12-11 NOTE — Progress Notes (Signed)
Bevely Palmer to be D/C'd Home per MD order.  Discussed with the patient and all questions fully answered.  VSS, Skin clean, dry and intact without evidence of skin break down, no evidence of skin tears noted. IV catheter discontinued intact. Site without signs and symptoms of complications. Dressing and pressure applied.  An After Visit Summary was printed and given to the patient. Patient received prescription.  D/c education completed with patient/family including follow up instructions, medication list, d/c activities limitations if indicated, with other d/c instructions as indicated by MD - patient able to verbalize understanding, all questions fully answered.   Patient instructed to return to ED, call 911, or call MD for any changes in condition.   Patient escorted via [place], and D/C home via private auto.  Luci Bank 12/11/2016 3:37 PM

## 2016-12-11 NOTE — Evaluation (Signed)
Occupational Therapy Evaluation and Discharge Patient Details Name: [name] MRN: [id number] DOB: 23-Jun-1932 Today's Date: 12/11/2016    History of Present Illness 83 y.o. female with medical history significant for diabetes mellitus 2 on oral agents, hypertension, remote history of CVA, CK D3, dyslipidemia and known HNP status post discectomy in March 2016. Patient had several dental extractions performed in February March 2018. Post procedure she had persistent dental pain that was initially felt due to "dry socket". Eventual evaluation revealed infectious concerns in lead to a final diagnosis of osteomyelitis. She was treated with antibiotics by her dentist (Dr. Jonetta Osgood) which have been completed several weeks ago. Plan was to pursue outpatient hyperbaric oxygen treatment at the wound care center in [place] beginning last week the patient was unable to attend secondary to issues related to back pain, she was admitted with sepsis   Clinical Impression   Pt admitted with above and presents to OT near baseline with ADLs and functional mobility.  Pt ambulated throughout room with RW to gather items and completed grooming tasks in standing at Mod I level.  Therapist assisted with managing IV pole (which pt should not have at home) during mobility.  Completed LB dressing at sit > stand level without assist.  Discussed recommendation for 3 in 1 vs shower seat for walk-in shower at home, which pt reports she has been meaning to get.  Pt does not require OT follow at this time.  Discussed recommendation for intermittent supervision from daughters, pt reports they plan to alternate assistance/staying with her upon d/c.  OT will sign off.    Follow Up Recommendations  Supervision - Intermittent;No OT follow up    Equipment Recommendations  3 in 1 bedside commode (vs shower seat)    Recommendations for Other Services       Precautions / Restrictions Precautions Precautions: None       Mobility Bed Mobility               General bed mobility comments: Received seated EOB and returned to EOB  Transfers Overall transfer level: Modified independent Equipment used: Rolling walker (2 wheeled)                      ADL either performed or assessed with clinical judgement   ADL Overall ADL's : Modified independent;At baseline                                       General ADL Comments: Completed mobility around room and toilet transfer with RW at overall Mod I level.  Therapist provided assistance to manage IV pole.  Completed grooming in standing and bathing at sit > stand level.  Pt able to doff/don underwear without assist.     Vision  Wears glasses for reading Eye Alignment: Within Functional Limits            Pertinent Vitals/Pain Pain Assessment: 0-10 Pain Score: 10-Worst pain ever Pain Location: back Pain Descriptors / Indicators: Constant Pain Intervention(s): Limited activity within patient's tolerance;Monitored during session;Premedicated before session     Hand Dominance Right   Extremity/Trunk Assessment Upper Extremity Assessment Upper Extremity Assessment: Overall WFL for tasks assessed           Communication Communication Communication: HOH   Cognition Arousal/Alertness: Awake/alert Behavior During Therapy: WFL for tasks assessed/performed Overall Cognitive Status: Within Functional Limits for tasks assessed  Home Living Family/patient expects to be discharged to:: Private residence Living Arrangements: Alone (daughter lives behind her) Available Help at Discharge: Family;Available 24 hours/day (reports her 2 daughters will alternate staying with her initially upon d/c) Type of Home: Mobile home Home Access: Stairs to enter Entrance Stairs-Number of Steps: 3 Entrance Stairs-Rails: Right;Left;Can reach both       Bathroom  Shower/Tub: Occupational psychologist: Standard     Home Equipment: Walker - standard;Cane - single point;Adaptive equipment;Hand held shower head Adaptive Equipment: Reacher Additional Comments: Pt's daughter lives right behind her and plans to alternate with other daughter staying with her as needed      Prior Functioning/Environment Level of Independence: Independent with assistive device(s)        Comments: reports recently using walker due to back pain        OT Problem List: Decreased strength;Impaired balance (sitting and/or standing);Pain      OT Treatment/Interventions:      OT Goals(Current goals can be found in the care plan section) Acute Rehab OT Goals Patient Stated Goal: to go home today OT Goal Formulation: All assessment and education complete, DC therapy   AM-PAC PT "6 Clicks" Daily Activity     Outcome Measure Help from another person eating meals?: None Help from another person taking care of personal grooming?: [name] from another person toileting, which includes using toliet, bedpan, or urinal?: [name] from another person bathing (including washing, rinsing, drying)?: [name] from another person to put on and taking off regular upper body clothing?: [name] from another person to put on and taking off regular lower body clothing?: A Little 6 Click Score: 19   End of Session Equipment Utilized During Treatment: Rolling walker;Gait belt Nurse Communication: Mobility status (need to thread IV through gown)  Activity Tolerance: Patient tolerated treatment well;Patient limited by pain Patient left: in bed;with call bell/phone within reach;with bed alarm set  OT Visit Diagnosis: Unsteadiness on feet (R26.81);Pain;Muscle weakness (generalized) (M62.81) Pain - part of body:  (back)                Time: 2751-7001 OT Time Calculation (min): 35 min Charges:  OT General Charges $OT Visit: 1 Procedure OT Evaluation $OT Eval  Low Complexity: 1 Procedure OT Treatments $Self Care/Home Management : 8-22 mins   Simonne Come, [phone] 12/11/2016, 10:43 AM

## 2016-12-13 DIAGNOSIS — D649 Anemia, unspecified: Secondary | ICD-10-CM | POA: Diagnosis not present

## 2016-12-13 DIAGNOSIS — M272 Inflammatory conditions of jaws: Secondary | ICD-10-CM | POA: Diagnosis not present

## 2016-12-13 DIAGNOSIS — A419 Sepsis, unspecified organism: Secondary | ICD-10-CM | POA: Diagnosis not present

## 2016-12-13 DIAGNOSIS — Z7984 Long term (current) use of oral hypoglycemic drugs: Secondary | ICD-10-CM | POA: Diagnosis not present

## 2016-12-13 DIAGNOSIS — N183 Chronic kidney disease, stage 3 (moderate): Secondary | ICD-10-CM | POA: Diagnosis not present

## 2016-12-13 DIAGNOSIS — M5136 Other intervertebral disc degeneration, lumbar region: Secondary | ICD-10-CM | POA: Diagnosis not present

## 2016-12-13 DIAGNOSIS — E1122 Type 2 diabetes mellitus with diabetic chronic kidney disease: Secondary | ICD-10-CM | POA: Diagnosis not present

## 2016-12-13 DIAGNOSIS — Z8673 Personal history of transient ischemic attack (TIA), and cerebral infarction without residual deficits: Secondary | ICD-10-CM | POA: Diagnosis not present

## 2016-12-13 DIAGNOSIS — I129 Hypertensive chronic kidney disease with stage 1 through stage 4 chronic kidney disease, or unspecified chronic kidney disease: Secondary | ICD-10-CM | POA: Diagnosis not present

## 2016-12-14 LAB — CULTURE, BLOOD (ROUTINE X 2)
CULTURE: NO GROWTH
Culture: NO GROWTH
Special Requests: ADEQUATE
Special Requests: ADEQUATE

## 2016-12-18 DIAGNOSIS — M272 Inflammatory conditions of jaws: Secondary | ICD-10-CM | POA: Diagnosis not present

## 2016-12-18 DIAGNOSIS — L598 Other specified disorders of the skin and subcutaneous tissue related to radiation: Secondary | ICD-10-CM | POA: Diagnosis not present

## 2016-12-19 DIAGNOSIS — M272 Inflammatory conditions of jaws: Secondary | ICD-10-CM | POA: Diagnosis not present

## 2016-12-19 DIAGNOSIS — L598 Other specified disorders of the skin and subcutaneous tissue related to radiation: Secondary | ICD-10-CM | POA: Diagnosis not present

## 2016-12-23 DIAGNOSIS — L598 Other specified disorders of the skin and subcutaneous tissue related to radiation: Secondary | ICD-10-CM | POA: Diagnosis not present

## 2016-12-23 DIAGNOSIS — M272 Inflammatory conditions of jaws: Secondary | ICD-10-CM | POA: Diagnosis not present

## 2016-12-24 DIAGNOSIS — E1122 Type 2 diabetes mellitus with diabetic chronic kidney disease: Secondary | ICD-10-CM | POA: Diagnosis not present

## 2016-12-24 DIAGNOSIS — M272 Inflammatory conditions of jaws: Secondary | ICD-10-CM | POA: Diagnosis not present

## 2016-12-24 DIAGNOSIS — A419 Sepsis, unspecified organism: Secondary | ICD-10-CM | POA: Diagnosis not present

## 2016-12-24 DIAGNOSIS — L598 Other specified disorders of the skin and subcutaneous tissue related to radiation: Secondary | ICD-10-CM | POA: Diagnosis not present

## 2016-12-24 DIAGNOSIS — N183 Chronic kidney disease, stage 3 (moderate): Secondary | ICD-10-CM | POA: Diagnosis not present

## 2016-12-24 DIAGNOSIS — Z8673 Personal history of transient ischemic attack (TIA), and cerebral infarction without residual deficits: Secondary | ICD-10-CM | POA: Diagnosis not present

## 2016-12-24 DIAGNOSIS — M5136 Other intervertebral disc degeneration, lumbar region: Secondary | ICD-10-CM | POA: Diagnosis not present

## 2016-12-24 DIAGNOSIS — I129 Hypertensive chronic kidney disease with stage 1 through stage 4 chronic kidney disease, or unspecified chronic kidney disease: Secondary | ICD-10-CM | POA: Diagnosis not present

## 2016-12-24 DIAGNOSIS — Z7984 Long term (current) use of oral hypoglycemic drugs: Secondary | ICD-10-CM | POA: Diagnosis not present

## 2016-12-24 DIAGNOSIS — D649 Anemia, unspecified: Secondary | ICD-10-CM | POA: Diagnosis not present

## 2016-12-25 DIAGNOSIS — M272 Inflammatory conditions of jaws: Secondary | ICD-10-CM | POA: Diagnosis not present

## 2016-12-25 DIAGNOSIS — L598 Other specified disorders of the skin and subcutaneous tissue related to radiation: Secondary | ICD-10-CM | POA: Diagnosis not present

## 2016-12-26 DIAGNOSIS — E119 Type 2 diabetes mellitus without complications: Secondary | ICD-10-CM | POA: Diagnosis not present

## 2016-12-26 DIAGNOSIS — A419 Sepsis, unspecified organism: Secondary | ICD-10-CM | POA: Diagnosis not present

## 2016-12-26 DIAGNOSIS — I10 Essential (primary) hypertension: Secondary | ICD-10-CM | POA: Diagnosis not present

## 2016-12-26 DIAGNOSIS — M545 Low back pain: Secondary | ICD-10-CM | POA: Diagnosis not present

## 2016-12-26 DIAGNOSIS — M272 Inflammatory conditions of jaws: Secondary | ICD-10-CM | POA: Diagnosis not present

## 2016-12-27 ENCOUNTER — Encounter (HOSPITAL_COMMUNITY): Payer: Self-pay

## 2016-12-27 ENCOUNTER — Emergency Department (HOSPITAL_COMMUNITY): Payer: Medicare Other

## 2016-12-27 ENCOUNTER — Inpatient Hospital Stay (HOSPITAL_COMMUNITY)
Admission: EM | Admit: 2016-12-27 | Discharge: 2017-01-02 | DRG: 871 | Disposition: A | Discharge status: Home Health | Payer: Medicare Other | Attending: Internal Medicine | Admitting: Internal Medicine

## 2016-12-27 DIAGNOSIS — E7800 Pure hypercholesterolemia, unspecified: Secondary | ICD-10-CM | POA: Diagnosis present

## 2016-12-27 DIAGNOSIS — A419 Sepsis, unspecified organism: Secondary | ICD-10-CM | POA: Diagnosis present

## 2016-12-27 DIAGNOSIS — Z8673 Personal history of transient ischemic attack (TIA), and cerebral infarction without residual deficits: Secondary | ICD-10-CM

## 2016-12-27 DIAGNOSIS — N183 Chronic kidney disease, stage 3 (moderate): Secondary | ICD-10-CM | POA: Diagnosis not present

## 2016-12-27 DIAGNOSIS — H3530 Unspecified macular degeneration: Secondary | ICD-10-CM | POA: Diagnosis not present

## 2016-12-27 DIAGNOSIS — K449 Diaphragmatic hernia without obstruction or gangrene: Secondary | ICD-10-CM | POA: Diagnosis not present

## 2016-12-27 DIAGNOSIS — E1122 Type 2 diabetes mellitus with diabetic chronic kidney disease: Secondary | ICD-10-CM | POA: Diagnosis present

## 2016-12-27 DIAGNOSIS — K922 Gastrointestinal hemorrhage, unspecified: Secondary | ICD-10-CM | POA: Diagnosis present

## 2016-12-27 DIAGNOSIS — K264 Chronic or unspecified duodenal ulcer with hemorrhage: Secondary | ICD-10-CM | POA: Diagnosis present

## 2016-12-27 DIAGNOSIS — K644 Residual hemorrhoidal skin tags: Secondary | ICD-10-CM | POA: Diagnosis present

## 2016-12-27 DIAGNOSIS — R6510 Systemic inflammatory response syndrome (SIRS) of non-infectious origin without acute organ dysfunction: Secondary | ICD-10-CM

## 2016-12-27 DIAGNOSIS — Z882 Allergy status to sulfonamides status: Secondary | ICD-10-CM | POA: Diagnosis not present

## 2016-12-27 DIAGNOSIS — R1110 Vomiting, unspecified: Secondary | ICD-10-CM | POA: Diagnosis not present

## 2016-12-27 DIAGNOSIS — R000 Tachycardia, unspecified: Secondary | ICD-10-CM | POA: Diagnosis present

## 2016-12-27 DIAGNOSIS — E1169 Type 2 diabetes mellitus with other specified complication: Secondary | ICD-10-CM | POA: Diagnosis present

## 2016-12-27 DIAGNOSIS — R509 Fever, unspecified: Secondary | ICD-10-CM | POA: Diagnosis not present

## 2016-12-27 DIAGNOSIS — R7881 Bacteremia: Secondary | ICD-10-CM | POA: Diagnosis not present

## 2016-12-27 DIAGNOSIS — R404 Transient alteration of awareness: Secondary | ICD-10-CM | POA: Diagnosis not present

## 2016-12-27 DIAGNOSIS — Z923 Personal history of irradiation: Secondary | ICD-10-CM | POA: Diagnosis not present

## 2016-12-27 DIAGNOSIS — G8929 Other chronic pain: Secondary | ICD-10-CM | POA: Diagnosis not present

## 2016-12-27 DIAGNOSIS — K269 Duodenal ulcer, unspecified as acute or chronic, without hemorrhage or perforation: Secondary | ICD-10-CM | POA: Diagnosis not present

## 2016-12-27 DIAGNOSIS — Z96653 Presence of artificial knee joint, bilateral: Secondary | ICD-10-CM | POA: Diagnosis not present

## 2016-12-27 DIAGNOSIS — K210 Gastro-esophageal reflux disease with esophagitis: Secondary | ICD-10-CM | POA: Diagnosis present

## 2016-12-27 DIAGNOSIS — R531 Weakness: Secondary | ICD-10-CM | POA: Diagnosis not present

## 2016-12-27 DIAGNOSIS — I129 Hypertensive chronic kidney disease with stage 1 through stage 4 chronic kidney disease, or unspecified chronic kidney disease: Secondary | ICD-10-CM | POA: Diagnosis not present

## 2016-12-27 DIAGNOSIS — K222 Esophageal obstruction: Secondary | ICD-10-CM | POA: Diagnosis not present

## 2016-12-27 DIAGNOSIS — Z85828 Personal history of other malignant neoplasm of skin: Secondary | ICD-10-CM | POA: Diagnosis not present

## 2016-12-27 DIAGNOSIS — Z66 Do not resuscitate: Secondary | ICD-10-CM | POA: Diagnosis present

## 2016-12-27 DIAGNOSIS — D62 Acute posthemorrhagic anemia: Secondary | ICD-10-CM | POA: Diagnosis present

## 2016-12-27 DIAGNOSIS — M272 Inflammatory conditions of jaws: Secondary | ICD-10-CM | POA: Diagnosis present

## 2016-12-27 DIAGNOSIS — K920 Hematemesis: Secondary | ICD-10-CM | POA: Diagnosis not present

## 2016-12-27 DIAGNOSIS — I10 Essential (primary) hypertension: Secondary | ICD-10-CM | POA: Diagnosis not present

## 2016-12-27 DIAGNOSIS — M869 Osteomyelitis, unspecified: Secondary | ICD-10-CM | POA: Diagnosis not present

## 2016-12-27 DIAGNOSIS — E875 Hyperkalemia: Secondary | ICD-10-CM | POA: Diagnosis not present

## 2016-12-27 DIAGNOSIS — K5731 Diverticulosis of large intestine without perforation or abscess with bleeding: Secondary | ICD-10-CM | POA: Diagnosis present

## 2016-12-27 DIAGNOSIS — Z452 Encounter for adjustment and management of vascular access device: Secondary | ICD-10-CM | POA: Diagnosis not present

## 2016-12-27 DIAGNOSIS — Z87891 Personal history of nicotine dependence: Secondary | ICD-10-CM | POA: Diagnosis not present

## 2016-12-27 DIAGNOSIS — R933 Abnormal findings on diagnostic imaging of other parts of digestive tract: Secondary | ICD-10-CM | POA: Diagnosis not present

## 2016-12-27 DIAGNOSIS — M2651 Abnormal jaw closure: Secondary | ICD-10-CM | POA: Diagnosis not present

## 2016-12-27 LAB — I-STAT CG4 LACTIC ACID, ED
LACTIC ACID, VENOUS: 1.7 mmol/L (ref 0.5–1.9)
LACTIC ACID, VENOUS: 1.23 mmol/L (ref 0.5–1.9)
Lactic Acid, Venous: 1.28 mmol/L (ref 0.5–1.9)

## 2016-12-27 LAB — COMPREHENSIVE METABOLIC PANEL
ALT: 17 U/L (ref 14–54)
AST: 27 U/L (ref 15–41)
Albumin: 3.2 g/dL — ABNORMAL LOW (ref 3.5–5.0)
Alkaline Phosphatase: 69 U/L (ref 38–126)
Anion gap: 12 (ref 5–15)
BUN: 19 mg/dL (ref 6–20)
CO2: 26 mmol/L (ref 22–32)
Calcium: 9.9 mg/dL (ref 8.9–10.3)
Chloride: 100 mmol/L — ABNORMAL LOW (ref 101–111)
Creatinine, Ser: 0.81 mg/dL (ref 0.44–1.00)
GFR calc Af Amer: >60 mL/min (ref >60)
GFR calc non Af Amer: >60 mL/min (ref >60)
Glucose, Bld: 217 mg/dL — ABNORMAL HIGH (ref 65–99)
Potassium: 3.5 mmol/L (ref 3.5–5.1)
Sodium: 138 mmol/L (ref 135–145)
Total Bilirubin: 0.3 mg/dL (ref 0.3–1.2)
Total Protein: 8.9 g/dL — ABNORMAL HIGH (ref 6.5–8.1)

## 2016-12-27 LAB — CBC WITH DIFFERENTIAL/PLATELET
BASOS PCT: 0 %
Basophils Absolute: 0 10*3/uL (ref 0.0–0.1)
EOS ABS: 0 10*3/uL (ref 0.0–0.7)
Eosinophils Relative: 0 %
HEMATOCRIT: 33.7 % — AB (ref 36.0–46.0)
HEMOGLOBIN: 10.7 g/dL — AB (ref 12.0–15.0)
Lymphocytes Relative: 11 %
Lymphs Abs: 2.4 10*3/uL (ref 0.7–4.0)
MCH: 28.2 pg (ref 26.0–34.0)
MCHC: 31.8 g/dL (ref 30.0–36.0)
MCV: 88.7 fL (ref 78.0–100.0)
MONOS PCT: 7 %
Monocytes Absolute: 1.5 10*3/uL — ABNORMAL HIGH (ref 0.1–1.0)
NEUTROS ABS: 17.6 10*3/uL — AB (ref 1.7–7.7)
NEUTROS PCT: 82 %
Platelets: 545 10*3/uL — ABNORMAL HIGH (ref 150–400)
RBC: 3.8 MIL/uL — AB (ref 3.87–5.11)
RDW: 13.5 % (ref 11.5–15.5)
WBC: 21.6 10*3/uL — AB (ref 4.0–10.5)

## 2016-12-27 LAB — URINALYSIS, ROUTINE W REFLEX MICROSCOPIC
Bilirubin Urine: NEGATIVE
Glucose, UA: NEGATIVE mg/dL
Hgb urine dipstick: NEGATIVE
Ketones, ur: NEGATIVE mg/dL
LEUKOCYTES UA: NEGATIVE
NITRITE: NEGATIVE
PH: 7 (ref 5.0–8.0)
Protein, ur: NEGATIVE mg/dL
SPECIFIC GRAVITY, URINE: 1.045 — AB (ref 1.005–1.030)

## 2016-12-27 LAB — I-STAT TROPONIN, ED: TROPONIN I, POC: 0.01 ng/mL (ref 0.00–0.08)

## 2016-12-27 LAB — GLUCOSE, CAPILLARY: Glucose-Capillary: 136 mg/dL — ABNORMAL HIGH (ref 65–99)

## 2016-12-27 MED ORDER — VANCOMYCIN HCL IN DEXTROSE 1-5 GM/200ML-% IV SOLN
1000.0000 mg | Freq: Once | INTRAVENOUS | Status: AC
  Administered 2016-12-27: 1000 mg via INTRAVENOUS
  Filled 2016-12-27: qty 200

## 2016-12-27 MED ORDER — IOPAMIDOL (ISOVUE-300) INJECTION 61%
INTRAVENOUS | Status: AC
  Administered 2016-12-27: 75 mL
  Filled 2016-12-27: qty 75

## 2016-12-27 MED ORDER — FAMOTIDINE 20 MG PO TABS
20.0000 mg | ORAL_TABLET | Freq: Every day | ORAL | Status: DC
  Administered 2016-12-27 – 2016-12-31 (×5): 20 mg via ORAL
  Filled 2016-12-27 (×5): qty 1

## 2016-12-27 MED ORDER — PIPERACILLIN-TAZOBACTAM 3.375 G IVPB 30 MIN
3.3750 g | Freq: Once | INTRAVENOUS | Status: AC
  Administered 2016-12-27: 3.375 g via INTRAVENOUS
  Filled 2016-12-27: qty 50

## 2016-12-27 MED ORDER — ONDANSETRON HCL 4 MG/2ML IJ SOLN
4.0000 mg | Freq: Once | INTRAMUSCULAR | Status: AC
  Administered 2016-12-27: 4 mg via INTRAVENOUS
  Filled 2016-12-27: qty 2

## 2016-12-27 MED ORDER — ENOXAPARIN SODIUM 30 MG/0.3ML ~~LOC~~ SOLN
30.0000 mg | SUBCUTANEOUS | Status: DC
  Administered 2016-12-27: 30 mg via SUBCUTANEOUS
  Filled 2016-12-27: qty 0.3

## 2016-12-27 MED ORDER — BACLOFEN 10 MG PO TABS: 10.0000 mg | ORAL_TABLET | Freq: Two times a day (BID) | ORAL | Status: DC | PRN

## 2016-12-27 MED ORDER — METOCLOPRAMIDE HCL 5 MG/ML IJ SOLN
10.0000 mg | Freq: Once | INTRAMUSCULAR | Status: AC
  Administered 2016-12-27: 10 mg via INTRAVENOUS
  Filled 2016-12-27: qty 2

## 2016-12-27 MED ORDER — PIPERACILLIN-TAZOBACTAM 3.375 G IVPB 30 MIN: 3.3750 g | Freq: Three times a day (TID) | INTRAVENOUS | Status: DC

## 2016-12-27 MED ORDER — SODIUM CHLORIDE 0.9 % IV BOLUS (SEPSIS)
1000.0000 mL | Freq: Once | INTRAVENOUS | Status: AC
  Administered 2016-12-27: 1000 mL via INTRAVENOUS

## 2016-12-27 MED ORDER — VANCOMYCIN HCL IN DEXTROSE 1-5 GM/200ML-% IV SOLN
1000.0000 mg | INTRAVENOUS | Status: DC
  Administered 2016-12-28: 1000 mg via INTRAVENOUS
  Filled 2016-12-27 (×2): qty 200

## 2016-12-27 MED ORDER — IOPAMIDOL (ISOVUE-300) INJECTION 61%
INTRAVENOUS | Status: AC
  Administered 2016-12-27: 70 mL
  Filled 2016-12-27: qty 100

## 2016-12-27 MED ORDER — DICLOFENAC POTASSIUM 50 MG PO TABS
50.0000 mg | ORAL_TABLET | Freq: Two times a day (BID) | ORAL | Status: DC
  Administered 2016-12-29 (×2): 50 mg via ORAL
  Filled 2016-12-27 (×3): qty 1

## 2016-12-27 MED ORDER — MIRTAZAPINE 30 MG PO TABS
30.0000 mg | ORAL_TABLET | Freq: Every day | ORAL | Status: DC
  Administered 2016-12-27 – 2017-01-01 (×6): 30 mg via ORAL
  Filled 2016-12-27 (×6): qty 1

## 2016-12-27 MED ORDER — CARVEDILOL 25 MG PO TABS
25.0000 mg | ORAL_TABLET | Freq: Two times a day (BID) | ORAL | Status: DC
  Administered 2016-12-27 – 2017-01-02 (×13): 25 mg via ORAL
  Filled 2016-12-27 (×13): qty 1

## 2016-12-27 MED ORDER — AMLODIPINE BESYLATE 5 MG PO TABS
5.0000 mg | ORAL_TABLET | Freq: Every day | ORAL | Status: DC
  Administered 2016-12-27 – 2017-01-02 (×7): 5 mg via ORAL
  Filled 2016-12-27 (×7): qty 1

## 2016-12-27 MED ORDER — HYDROCODONE-ACETAMINOPHEN 5-325 MG PO TABS
1.0000 | ORAL_TABLET | ORAL | Status: DC | PRN
  Administered 2016-12-27 – 2017-01-02 (×13): 1 via ORAL
  Filled 2016-12-27 (×13): qty 1

## 2016-12-27 MED ORDER — CYCLOBENZAPRINE HCL 10 MG PO TABS
10.0000 mg | ORAL_TABLET | Freq: Three times a day (TID) | ORAL | Status: DC | PRN
  Administered 2016-12-31: 10 mg via ORAL
  Filled 2016-12-27: qty 1

## 2016-12-27 MED ORDER — GABAPENTIN 300 MG PO CAPS
300.0000 mg | ORAL_CAPSULE | Freq: Three times a day (TID) | ORAL | Status: DC
  Administered 2016-12-27 – 2017-01-02 (×18): 300 mg via ORAL
  Filled 2016-12-27 (×18): qty 1

## 2016-12-27 MED ORDER — PIPERACILLIN-TAZOBACTAM 3.375 G IVPB
3.3750 g | Freq: Three times a day (TID) | INTRAVENOUS | Status: DC
  Administered 2016-12-27 – 2016-12-29 (×5): 3.375 g via INTRAVENOUS
  Filled 2016-12-27 (×6): qty 50

## 2016-12-27 NOTE — ED Notes (Signed)
Patient transported to CT

## 2016-12-27 NOTE — ED Provider Notes (Addendum)
[place] DEPT Provider Note   CSN: [id number] Arrival date & time: 12/27/16  1105     History   Chief Complaint Chief Complaint  Patient presents with  . Weakness    HPI [name] is a 82 y.o. female.  Patient is a 83 year old female who presents with generalized weakness and nausea. She's had a complicated recent medical course. She has some teeth extracted couple of months ago and developed what was felt to be osteomyelitis of the right mandible related to that. She was recently admitted the first part of May of this year for osteomyelitis and sepsis. She is currently still receiving outpatient Keflex. She comes in today because she's been feeling worse over the last few days.  She's been extremely weak. She has no appetite. She's been nauseated. She's had some dry heaves. She denies any increased pain or swelling to her jaw. She's been running some fevers between 99 and 100 at home. She denies abdominal pain. No change in stool habits. No urinary symptoms. No headache. No neck or back pain. No skin infections or wounds.      Past Medical History:  Diagnosis Date  . Anxiety   . Arthritis    osteo; "mostly hands, knees, probably back too" (12/09/2016)  . Chronic lower back pain   . Complication of anesthesia 2011   resp distress -on vent after surgery  . Depression   . Esophageal stricture   . GERD (gastroesophageal reflux disease)   . Headache    out grew them  . High cholesterol   . History of blood transfusion 2011; ?03/2012   "related to ORs" (12/09/2016)  . Hypertension   . Intestinal obstruction ([place])   . Macular degeneration of left eye    dx over 55 yrs ago.....hasn't changed much  . Shingles   . Squamous carcinoma 2013   squamas cell on scalp--took 14 radiation tx--2013  . Stroke [facility]) 1967   Mini stroke;  No lasting deficits  . Type II diabetes mellitus ([place]) dx'd 11/2016    Patient Active Problem List   Diagnosis Date Noted  . HLD  (hyperlipidemia) 12/09/2016  . GERD (gastroesophageal reflux disease) 12/09/2016  . Depression 12/09/2016  . History of CVA (cerebrovascular accident) 12/09/2016  . CKD (chronic kidney disease), stage III 12/09/2016  . Back pain 12/09/2016  . Sepsis ([place]) 12/09/2016  . History of oral surgery 12/09/2016  . Osteomyelitis of jaw 12/09/2016  . Diabetes mellitus type 2, uncontrolled ([place]) 12/09/2016  . History of Lumbar herniated disc 08/09/2014  . UTI (lower urinary tract infection) 04/08/2012  . Pneumonia 04/04/2012  . Acute blood loss anemia 04/01/2012  . Fall at home 03/31/2012  . History of shingles 03/31/2012  . Anemia 03/31/2012  . Femur fracture, left ([place]) 03/31/2012  . Hypertension 03/30/2012    Past Surgical History:  Procedure Laterality Date  . ABDOMINAL HYSTERECTOMY    . APPENDECTOMY    . BACK SURGERY    . CATARACT EXTRACTION, BILATERAL Bilateral   . CHOLECYSTECTOMY    . COLON SURGERY    . COLONOSCOPY    . ESOPHAGOGASTRODUODENOSCOPY (EGD) WITH ESOPHAGEAL DILATION  "several times"  . EYE SURGERY    . FRACTURE SURGERY    . JOINT REPLACEMENT    . LUMBAR DISC SURGERY  10/25/2014   Right L4-L5 removal of seven free fragments of disk mostly posterolaterally.  Lysis of adhesion.  Microscope/notes 10/26/2014  . LUMBAR LAMINECTOMY/DECOMPRESSION MICRODISCECTOMY Right 08/09/2014   Procedure: Right Lumbar Four to  Five Microdiskectomy;  Surgeon: Floyce Stakes, MD;  Location: [facility]UR[facility] ORS;  Service: Neurosurgery;  Laterality: Right;  Right L4-5 Microdiskectomy  . ORIF PERIPROSTHETIC FRACTURE  03/31/2012   Procedure: OPEN REDUCTION INTERNAL FIXATION (ORIF) PERIPROSTHETIC FRACTURE;  Surgeon: Mauri Pole, MD;  Location: [facility] ORS;  Service: Orthopedics;  Laterality: Left;  Open reduction internal fixation Left distal femur periprosthetic fracture  . SMALL INTESTINE SURGERY  2011   "really bad blockage; went into respiratory distress and was on ventilator after surgery; [facility]"  . SQUAMOUS CELL CARCINOMA EXCISION  ~ 2012   S/P "cut off her head then 15 radiation txs"   . TOTAL KNEE ARTHROPLASTY Bilateral    bilateral    OB History    No data available       Home Medications    Prior to Admission medications   Medication Sig Start Date End Date Taking? Authorizing Provider  amLODipine (NORVASC) 5 MG tablet Take 5 mg by mouth daily.   Yes [provider]  atorvastatin (LIPITOR) 40 MG tablet Take 40 mg by mouth daily.   Yes [provider]  baclofen (LIORESAL) 10 MG tablet Take 10 mg by mouth 2 (two) times daily as needed for muscle spasms.   Yes [provider]  carvedilol (COREG) 25 MG tablet Take 25 mg by mouth 2 (two) times daily with a meal.   Yes [provider]  cyclobenzaprine (FLEXERIL) 10 MG tablet Take 1 tablet (10 mg total) by mouth 3 (three) times daily as needed for muscle spasms. 10/21/14  Yes Leonard Schwartz, MD  diclofenac (CATAFLAM) 50 MG tablet Take 50 mg by mouth 2 (two) times daily.   Yes [provider]  gabapentin (NEURONTIN) 300 MG capsule Take 300 mg by mouth 3 (three) times daily.   Yes [provider]  HYDROcodone-acetaminophen (NORCO/VICODIN) 5-325 MG tablet Take 1 tablet by mouth every 4 (four) hours as needed for moderate pain.   Yes [provider]  losartan-hydrochlorothiazide (HYZAAR) 100-25 MG per tablet Take 1 tablet by mouth daily.   Yes [provider]  mirtazapine (REMERON) 30 MG tablet Take 30 mg by mouth at bedtime.   Yes [provider]  ranitidine (ZANTAC) 150 MG tablet Take 150 mg by mouth daily.   Yes [provider]  metFORMIN (GLUCOPHAGE-XR) 500 MG 24 hr tablet Take 500 mg by mouth daily with breakfast.    [provider]  traMADol-acetaminophen (ULTRACET) 37.5-325 MG per tablet Take 2 tablets by mouth 4 (four) times daily as needed (pain).    [provider]    Family History History reviewed. No  pertinent family history.  Social History Social History  Substance Use Topics  . Smoking status: Former Smoker    Packs/day: 0.10    Years: 10.00    Types: Cigarettes    Quit date: 03/31/1991  . Smokeless tobacco: Never Used  . Alcohol use No     Allergies   Sulfa antibiotics   Review of Systems Review of Systems  Constitutional: Positive for fatigue and fever. Negative for chills and diaphoresis.  HENT: Negative for congestion, rhinorrhea and sneezing.   Eyes: Negative.   Respiratory: Negative for cough, chest tightness and shortness of breath.   Cardiovascular: Negative for chest pain and leg swelling.  Gastrointestinal: Positive for nausea and vomiting. Negative for abdominal pain, blood in stool and diarrhea.  Genitourinary: Negative for difficulty urinating, flank pain, frequency and hematuria.  Musculoskeletal: Negative for arthralgias and  back pain.  Skin: Negative for rash.  Neurological: Negative for dizziness, speech difficulty, weakness, numbness and headaches.     Physical Exam Updated Vital Signs BP (!) 145/61 (BP Location: Right Arm)   Pulse (!) 106   Temp 99.4 F (37.4 C) (Oral)   Resp 15   SpO2 97%   Physical Exam  Constitutional: She is oriented to person, place, and time. She appears well-developed and well-nourished.  HENT:  Head: Normocephalic and atraumatic.  Eyes: Pupils are equal, round, and reactive to light.  Neck: Normal range of motion. Neck supple.  No meningismus  Cardiovascular: Regular rhythm and normal heart sounds.  Tachycardia present.   Pulmonary/Chest: Effort normal and breath sounds normal. No respiratory distress. She has no wheezes. She has no rales. She exhibits no tenderness.  Abdominal: Soft. Bowel sounds are normal. There is no tenderness. There is no rebound and no guarding.  Musculoskeletal: Normal range of motion. She exhibits no edema.  No pain on palpation the spine  Lymphadenopathy:    She has no cervical  adenopathy.  Neurological: She is alert and oriented to person, place, and time.  Skin: Skin is warm and dry. No rash noted.  Psychiatric: She has a normal mood and affect.     ED Treatments / Results  Labs (all labs ordered are listed, but only abnormal results are displayed) Labs Reviewed  COMPREHENSIVE METABOLIC PANEL - Abnormal; Notable for the following:       Result Value   Chloride 100 (*)    Glucose, Bld 217 (*)    Total Protein 8.9 (*)    Albumin 3.2 (*)    All other components within normal limits  CBC WITH DIFFERENTIAL/PLATELET - Abnormal; Notable for the following:    WBC 21.6 (*)    RBC 3.80 (*)    Hemoglobin 10.7 (*)    HCT 33.7 (*)    Platelets 545 (*)    Neutro Abs 17.6 (*)    Monocytes Absolute 1.5 (*)    All other components within normal limits  CULTURE, BLOOD (ROUTINE X 2)  CULTURE, BLOOD (ROUTINE X 2)  URINALYSIS, ROUTINE W REFLEX MICROSCOPIC  I-STAT CG4 LACTIC ACID, ED  I-STAT TROPOININ, ED  I-STAT CG4 LACTIC ACID, ED  I-STAT CG4 LACTIC ACID, ED    EKG  EKG Interpretation  Date/Time:  Friday Dec 27 2016 11:13:06 EDT Ventricular Rate:  113 PR Interval:    QRS Duration: 92 QT Interval:  327 QTC Calculation: 449 R Axis:   44 Text Interpretation:  Sinus tachycardia Multiform ventricular premature complexes Minimal ST depression, inferior leads Baseline wander in lead(s) III V1 SINCE LAST TRACING HEART RATE HAS INCREASED Confirmed by Malvin Johns [phone]) on 12/27/2016 11:16:08 AM Also confirmed by Malvin Johns [phone]), editor Drema Pry [phone])  on 12/27/2016 11:49:54 AM       Radiology Dg Chest 2 View  Result Date: 12/27/2016 CLINICAL DATA:  Fever EXAM: CHEST  2 VIEW COMPARISON:  12/09/2016 FINDINGS: Improved lung volume and improved aeration in the lung bases since prior study. Lungs are now clear without infiltrate or effusion. Negative for heart failure. Atherosclerotic aorta. IMPRESSION: No active cardiopulmonary disease.  Electronically Signed   By: Franchot Gallo M.D.   On: 12/27/2016 12:48   Ct Abdomen Pelvis W Contrast  Result Date: 12/27/2016 CLINICAL DATA:  Continued weakness, leukocytosis, recently hospitalized for urosepsis, vomiting, fever, loss of appetite EXAM: CT ABDOMEN AND PELVIS WITH CONTRAST TECHNIQUE: Multidetector CT imaging of the abdomen and pelvis  was performed using the standard protocol following bolus administration of intravenous contrast. Sagittal and coronal MPR images reconstructed from axial data set. CONTRAST:  45m ISOVUE-300 IOPAMIDOL (ISOVUE-300) INJECTION 61% IV. Dilute oral contrast. COMPARISON:  06/30/2014 FINDINGS: Lower chest: Lung bases clear Hepatobiliary: Multiple hepatic cysts. Liver otherwise unremarkable. Question mild gallbladder wall thickening. Mild edema is seen in the pericholecystic fat though this is also seen adjacent to the duodenal bulb, which appears thickened. Gallbladder does not appear to be the epicenter of this process. No definite calcified gallstones. Pancreas: Normal appearance Spleen: Normal appearance Adrenals/Urinary Tract: Adrenal glands normal appearance. Tiny LEFT renal cysts. Complex mass lateral aspect upper pole RIGHT kidney containing fluid, calcifications, question minimal fat, associated with cortical thinning, question cortical scar/infarct, unchanged. Kidneys, ureters, and bladder otherwise normal appearance. Stomach/Bowel: As noted above, duodenal bulb wall thickening and very duodenal infiltration of fat extending to the gallbladder, raising question of duodenal ulcer disease. Questionable collection of gas is seen within the duodenal wall versus redundant mucosa. Fat planes between the duodenum and pancreatic head appear ill defined though pancreas is otherwise normal in appearance. Distal colonic diverticulosis without evidence of diverticulitis. Nonspecific mildly prominent small bowel loops in the LEFT mid abdomen without evidence of obstruction.  Remaining bowel loops unremarkable. Moderate-sized hiatal hernia with remainder of stomach unremarkable. Vascular/Lymphatic: Atherosclerotic calcifications aorta without aneurysm. Additional atherosclerotic calcifications head coronary arteries, iliac arteries and the origins of the celiac artery, renal arteries, and SMA. Reproductive: Uterus surgically absent. Nonvisualization of ovaries. Other: No free air free fluid.  No definite hernia. Musculoskeletal: Bones demineralized with degenerative disc and facet disease changes lumbar spine. IMPRESSION: Duodenal wall thickening with very duodenal infiltrative changes extending to the gallbladder and adjacent to the pancreatic head, raising question of duodenal ulcer disease. No definite extra intestinal gas is identified though a small focus of gas is likely present within the duodenal wall. Hepatic and renal cysts with complex cystic lesion with calcification at the upper pole of the RIGHT kidney, favor sequela of prior hemorrhage or infarct. Moderate-sized hiatal hernia. Distal colonic diverticulosis without evidence of diverticulitis. Aortic atherosclerosis and coronary arterial calcification as above. Electronically Signed   By: MLavonia DanaM.D.   On: 12/27/2016 16:12   Ct Maxillofacial W Contrast  Result Date: 12/27/2016 CLINICAL DATA:  Abnormal right mandible, concerning for osteomyelitis. History of dry socket in osteomyelitis related to recent dental extractions. EXAM: CT MAXILLOFACIAL WITH CONTRAST TECHNIQUE: MMultidetector CT imaging of the maxillofacial structures was performed with intravenous contrast. Multiplanar CT image reconstructions were also generated. A small metallic BB was placed on the right temple in order to reliably differentiate right from left. COMPARISON:  12/09/2016 FINDINGS: Osseous: Gas still seen within the right molar alveolus, with minimal fluid or debris. Semi-solid material and metallic density seen previously is no longer  identified. No soft tissue abscess or inflammation noted. Intermittently seen gas between the tongue and floor of mouth without definite soft tissue emphysema. No bony sclerosis, erosion, or involucrum to suggest acute osteomyelitis. Orbits: Bilateral cataract resection.  No inflammation or mass. Sinuses: Negative.  No sinusitis or sinus obstruction. Soft tissues: No cellulitic changes noted. Prominent atherosclerotic calcification at the carotid bifurcations. Limited intracranial: Negative IMPRESSION: Persistent dry socket appearance of the right mandibular molar extraction site. No soft tissue abscess or detected cellulitis. No bony changes of osteomyelitis. Electronically Signed   By: JMonte FantasiaM.D.   On: 12/27/2016 14:24    Procedures Procedures (including critical care time)  Medications Ordered  in ED Medications  sodium chloride 0.9 % bolus 1,000 mL (0 mLs Intravenous Stopped 12/27/16 1313)  ondansetron (ZOFRAN) injection 4 mg (4 mg Intravenous Given 12/27/16 1154)  piperacillin-tazobactam (ZOSYN) IVPB 3.375 g (0 g Intravenous Stopped 12/27/16 1345)  vancomycin (VANCOCIN) IVPB 1000 mg/200 mL premix (0 mg Intravenous Stopped 12/27/16 1531)  iopamidol (ISOVUE-300) 61 % injection (75 mLs  Contrast Given 12/27/16 1400)  metoCLOPramide (REGLAN) injection 10 mg (10 mg Intravenous Given 12/27/16 1420)  iopamidol (ISOVUE-300) 61 % injection (70 mLs  Contrast Given 12/27/16 1544)     Initial Impression / Assessment and Plan / ED Course  I have reviewed the triage vital signs and the nursing notes.  Pertinent labs & imaging results that were available during my care of the patient were reviewed by me and considered in my medical decision making (see chart for details).    Patient presents with febrile illness answers criteria for sepsis. She was treated with broad-spectrum antibiotics and IV fluids. Her lactate is normal. She's had no episodes of hypotension. At this point I don't have a source  for the infection. I did imaging studies and mandible which don't reveal any evidence of osteomyelitis or abscess. Given her ongoing nausea and vomiting I did a CT scan of her abdomen and pelvis which show no evidence of intra-abdominal infection. She has no suggestions of meningitis. There is no evident skin infections. She doesn't have a murmur which be more suggestive of endocarditis. Her urinalysis is still pending. I spoke with Dr. Verlon Au with the hospitalist service who will admit the pt.  Final Clinical Impressions(s) / ED Diagnoses   Final diagnoses:  SIRS (systemic inflammatory response syndrome) ([place])  Febrile illness    New Prescriptions New Prescriptions   No medications on file     Malvin Johns, MD 12/27/16 1653    Malvin Johns, MD 12/27/16 [phone]

## 2016-12-27 NOTE — H&P (Signed)
[name] KQA:[id number] DOB: 03-27-1932 DOA: 12/27/2016  Referring physician: ED PCP: [name], PA-C  Specialists:  None yet  Chief Complaint: fever  HPI:  [name] is a 83 y.o. female  Dm ty II Htn CVA CKD III HLD s/p discectomy 3.2016, 08/2014 as wel Dental pain s/p multiple recent extractions [Dr. Lenda Kelp [place]] and Jaw osteomyelitis htn Prior urina retantion L femoral # Prior remote h/o CVA at birth of daughter in [place] colonoscopy 2002  Recent admit 5/6-12/11/16 Jaw osteo-sent home with Keflex-patient is set to receive 4 more doses to complete a treatment for this and is on 4/40 treatments of hyperbaric oxygen to help heal this area. Patient relates in the presence of her daughter that she has been feeling "terrible" since 12/25/2016. Because of continued feeling poorly, nauseous and not able to keep down food went to primary care physician who recommended labs and was told to come to the emergency room because of elevated white count without any fever Fever presumably not present because the patient already on antibiotics being completed  N/v dry heaves, Had episodic vomiting on 5/23 but none since Poor by mouth intake  Diarrhea last Friday 5/18 No cough No cold No sputum no ill contacts Has had increasing frequency of urination however. Note current diarrhea No dysuria Denies any unilateral weakness or blurred vision or double vision   no back painNightly however has been feeling tingling . In her left leg has been told to follow up with her neurosurgeon in the outpatient setting once everything settles down  Has not had much sleep because has been jittery and cannot relax enough to sleep at night    Tachy on arrival with Hr 120's Tmax 99.4 CT abd shows no specfic Lactic acid was reasonably normal No meningismus    Review of Systems: Except as above is negative  Past Medical History:  Diagnosis Date  . Anxiety   . Arthritis    osteo; "mostly hands, knees, probably back too" (12/09/2016)  . Chronic lower back pain   . Complication of anesthesia 2011   resp distress -on vent after surgery  . Depression   . Esophageal stricture   . GERD (gastroesophageal reflux disease)   . Headache    out grew them  . High cholesterol   . History of blood transfusion 2011; ?03/2012   "related to ORs" (12/09/2016)  . Hypertension   . Intestinal obstruction ([place])   . Macular degeneration of left eye    dx over 55 yrs ago.....hasn't changed much  . Shingles   . Squamous carcinoma 2013   squamas cell on scalp--took 14 radiation tx--2013  . Stroke [facility]) 1967   Mini stroke;  No lasting deficits  . Type II diabetes mellitus ([place]) dx'd 11/2016   Past Surgical History:  Procedure Laterality Date  . ABDOMINAL HYSTERECTOMY    . APPENDECTOMY    . BACK SURGERY    . CATARACT EXTRACTION, BILATERAL Bilateral   . CHOLECYSTECTOMY    . COLON SURGERY    . COLONOSCOPY    . ESOPHAGOGASTRODUODENOSCOPY (EGD) WITH ESOPHAGEAL DILATION  "several times"  . EYE SURGERY    . FRACTURE SURGERY    . JOINT REPLACEMENT    . LUMBAR DISC SURGERY  10/25/2014   Right L4-L5 removal of seven free fragments of disk mostly posterolaterally.  Lysis of adhesion.  Microscope/notes 10/26/2014  . LUMBAR LAMINECTOMY/DECOMPRESSION MICRODISCECTOMY Right 08/09/2014   Procedure: Right Lumbar Four to Five Microdiskectomy;  Surgeon: Floyce Stakes, MD;  Location: [facility]S;  Service: Neurosurgery;  Laterality: Right;  Right L4-5 Microdiskectomy  . ORIF PERIPROSTHETIC FRACTURE  03/31/2012   Procedure: OPEN REDUCTION INTERNAL FIXATION (ORIF) PERIPROSTHETIC FRACTURE;  Surgeon: Mauri Pole, MD;  Location: [facility] ORS;  Service: Orthopedics;  Laterality: Left;  Open reduction internal fixation Left distal femur periprosthetic fracture  . SMALL INTESTINE SURGERY  2011   "really bad blockage; went into respiratory distress and was on ventilator after surgery; [facility]"  .  SQUAMOUS CELL CARCINOMA EXCISION  ~ 2012   S/P "cut off her head then 15 radiation txs"   . TOTAL KNEE ARTHROPLASTY Bilateral    bilateral   Social History:  reports that she quit smoking about 25 years ago. Her smoking use included Cigarettes. She has a 1.00 pack-year smoking history. She has never used smokeless tobacco. She reports that she does not drink alcohol or use drugs. She lives at home can perform IADLs and ADLs without deficit usually is driving, daughter lives next door she has another daughter who is present in the room and her son She completed high school and then went on to have 3 children she smoked until about 20 years ago and smoked one to one and half packs per day never drinker  Family history significant for strokes in heart attacks  She is allergic to sulfa  Allergies  Allergen Reactions  . Sulfa Antibiotics Swelling    Mouth and tongue swelling    History reviewed. No pertinent family history. The above discussion  Prior to Admission medications   Medication Sig Start Date End Date Taking? Authorizing Provider  amLODipine (NORVASC) 5 MG tablet Take 5 mg by mouth daily.   Yes [provider]  atorvastatin (LIPITOR) 40 MG tablet Take 40 mg by mouth daily.   Yes [provider]  baclofen (LIORESAL) 10 MG tablet Take 10 mg by mouth 2 (two) times daily as needed for muscle spasms.   Yes [provider]  carvedilol (COREG) 25 MG tablet Take 25 mg by mouth 2 (two) times daily with a meal.   Yes [provider]  cyclobenzaprine (FLEXERIL) 10 MG tablet Take 1 tablet (10 mg total) by mouth 3 (three) times daily as needed for muscle spasms. 10/21/14  Yes Leonard Schwartz, MD  diclofenac (CATAFLAM) 50 MG tablet Take 50 mg by mouth 2 (two) times daily.   Yes [provider]  gabapentin (NEURONTIN) 300 MG capsule Take 300 mg by mouth 3 (three) times daily.   Yes [provider]  HYDROcodone-acetaminophen (NORCO/VICODIN)  5-325 MG tablet Take 1 tablet by mouth every 4 (four) hours as needed for moderate pain.   Yes [provider]  losartan-hydrochlorothiazide (HYZAAR) 100-25 MG per tablet Take 1 tablet by mouth daily.   Yes [provider]  mirtazapine (REMERON) 30 MG tablet Take 30 mg by mouth at bedtime.   Yes [provider]  ranitidine (ZANTAC) 150 MG tablet Take 150 mg by mouth daily.   Yes [provider]  metFORMIN (GLUCOPHAGE-XR) 500 MG 24 hr tablet Take 500 mg by mouth daily with breakfast.    [provider]  traMADol-acetaminophen (ULTRACET) 37.5-325 MG per tablet Take 2 tablets by mouth 4 (four) times daily as needed (pain).    [provider]   Physical Exam: Vitals:   12/27/16 1500 12/27/16 1519  BP: (!) 145/61 (!) 145/61  Pulse:  (!) 106  Resp: (!) 22 15  Temp:  Alert pleasant edentulous with no open sores or evidence of other issue Neck soft supple thick fat pad at the front neck without thyromegaly or lymphadenopathy Chest is clinically clear no added sound no rales no rhonchi noted on lower back there is well-healed scar S1-S2 regular rate rhythm with slight tachycardia without murmur Slightly bloated abdomen with midline lower scar consistent with classic cesarean slightly tender in epigastrium and in lower watch and but not severe Neurologically is intact no meningismus no Brudzinski sign power 5/5 in all extremities reflexes are attenuated in both knees dorsi plantarflexion of the foot is normal sensory is intact no lower extremity edema and no rash  Labs on Admission:  Basic Metabolic Panel:  Recent Labs Lab 12/27/16 1200  NA 138  K 3.5  CL 100*  CO2 26  GLUCOSE 217*  BUN 19  CREATININE 0.81  CALCIUM 9.9   Liver Function Tests:  Recent Labs Lab 12/27/16 1200  AST 27  ALT 17  ALKPHOS 69  BILITOT 0.3  PROT 8.9*  ALBUMIN 3.2*   No results for input(s): LIPASE, AMYLASE in the last 168 hours. No results for  input(s): AMMONIA in the last 168 hours. CBC:  Recent Labs Lab 12/27/16 1200  WBC 21.6*  NEUTROABS 17.6*  HGB 10.7*  HCT 33.7*  MCV 88.7  PLT 545*   Cardiac Enzymes: No results for input(s): CKTOTAL, CKMB, CKMBINDEX, TROPONINI in the last 168 hours.  BNP (last 3 results) No results for input(s): BNP in the last 8760 hours.  ProBNP (last 3 results) No results for input(s): PROBNP in the last 8760 hours.  CBG: No results for input(s): GLUCAP in the last 168 hours.  Radiological Exams on Admission: Dg Chest 2 View  Result Date: 12/27/2016 CLINICAL DATA:  Fever EXAM: CHEST  2 VIEW COMPARISON:  12/09/2016 FINDINGS: Improved lung volume and improved aeration in the lung bases since prior study. Lungs are now clear without infiltrate or effusion. Negative for heart failure. Atherosclerotic aorta. IMPRESSION: No active cardiopulmonary disease. Electronically Signed   By: Franchot Gallo M.D.   On: 12/27/2016 12:48   Ct Abdomen Pelvis W Contrast  Result Date: 12/27/2016 CLINICAL DATA:  Continued weakness, leukocytosis, recently hospitalized for urosepsis, vomiting, fever, loss of appetite EXAM: CT ABDOMEN AND PELVIS WITH CONTRAST TECHNIQUE: Multidetector CT imaging of the abdomen and pelvis was performed using the standard protocol following bolus administration of intravenous contrast. Sagittal and coronal MPR images reconstructed from axial data set. CONTRAST:  6m ISOVUE-300 IOPAMIDOL (ISOVUE-300) INJECTION 61% IV. Dilute oral contrast. COMPARISON:  06/30/2014 FINDINGS: Lower chest: Lung bases clear Hepatobiliary: Multiple hepatic cysts. Liver otherwise unremarkable. Question mild gallbladder wall thickening. Mild edema is seen in the pericholecystic fat though this is also seen adjacent to the duodenal bulb, which appears thickened. Gallbladder does not appear to be the epicenter of this process. No definite calcified gallstones. Pancreas: Normal appearance Spleen: Normal appearance  Adrenals/Urinary Tract: Adrenal glands normal appearance. Tiny LEFT renal cysts. Complex mass lateral aspect upper pole RIGHT kidney containing fluid, calcifications, question minimal fat, associated with cortical thinning, question cortical scar/infarct, unchanged. Kidneys, ureters, and bladder otherwise normal appearance. Stomach/Bowel: As noted above, duodenal bulb wall thickening and very duodenal infiltration of fat extending to the gallbladder, raising question of duodenal ulcer disease. Questionable collection of gas is seen within the duodenal wall versus redundant mucosa. Fat planes between the duodenum and pancreatic head appear ill defined though pancreas is otherwise normal in appearance. Distal colonic diverticulosis without evidence of  diverticulitis. Nonspecific mildly prominent small bowel loops in the LEFT mid abdomen without evidence of obstruction. Remaining bowel loops unremarkable. Moderate-sized hiatal hernia with remainder of stomach unremarkable. Vascular/Lymphatic: Atherosclerotic calcifications aorta without aneurysm. Additional atherosclerotic calcifications head coronary arteries, iliac arteries and the origins of the celiac artery, renal arteries, and SMA. Reproductive: Uterus surgically absent. Nonvisualization of ovaries. Other: No free air free fluid.  No definite hernia. Musculoskeletal: Bones demineralized with degenerative disc and facet disease changes lumbar spine. IMPRESSION: Duodenal wall thickening with very duodenal infiltrative changes extending to the gallbladder and adjacent to the pancreatic head, raising question of duodenal ulcer disease. No definite extra intestinal gas is identified though a small focus of gas is likely present within the duodenal wall. Hepatic and renal cysts with complex cystic lesion with calcification at the upper pole of the RIGHT kidney, favor sequela of prior hemorrhage or infarct. Moderate-sized hiatal hernia. Distal colonic diverticulosis  without evidence of diverticulitis. Aortic atherosclerosis and coronary arterial calcification as above. Electronically Signed   By: Lavonia Dana M.D.   On: 12/27/2016 16:12   Ct Maxillofacial W Contrast  Result Date: 12/27/2016 CLINICAL DATA:  Abnormal right mandible, concerning for osteomyelitis. History of dry socket in osteomyelitis related to recent dental extractions. EXAM: CT MAXILLOFACIAL WITH CONTRAST TECHNIQUE: MMultidetector CT imaging of the maxillofacial structures was performed with intravenous contrast. Multiplanar CT image reconstructions were also generated. A small metallic BB was placed on the right temple in order to reliably differentiate right from left. COMPARISON:  12/09/2016 FINDINGS: Osseous: Gas still seen within the right molar alveolus, with minimal fluid or debris. Semi-solid material and metallic density seen previously is no longer identified. No soft tissue abscess or inflammation noted. Intermittently seen gas between the tongue and floor of mouth without definite soft tissue emphysema. No bony sclerosis, erosion, or involucrum to suggest acute osteomyelitis. Orbits: Bilateral cataract resection.  No inflammation or mass. Sinuses: Negative.  No sinusitis or sinus obstruction. Soft tissues: No cellulitic changes noted. Prominent atherosclerotic calcification at the carotid bifurcations. Limited intracranial: Negative IMPRESSION: Persistent dry socket appearance of the right mandibular molar extraction site. No soft tissue abscess or detected cellulitis. No bony changes of osteomyelitis. Electronically Signed   By: Monte Fantasia M.D.   On: 12/27/2016 14:24    EKG: Independently reviewed. Sinus tachycardia PR interval 0.20 QRS axis is 45 PVC noted no ST-T wave depressions  Assessment/Plan  Possible SIRS-not septic as not hypotensive now acute organ injury Possible source is urine. If urine analysis does not reveal any findings and cultures negative we'll follow blood  culture we will cycle lactic acid and we will get further workup with imaging of either jaw or back Stop prior to admission Keflex as well as to complete course of antibiotics regardless in the next 48 hours CBC plus differential in a.m. Okay for telemetry  Sinus tachycardia Reactive secondary to SIRS She has not really been taking her Coreg so would place her on home dose 25 twice a day with clear liquids  Recent osteomyelitis of the jaw Completing apparently hyperbaric treatments in [place] [facility] Keflex which is supposed to finish 5/27 Can continue Norco one every 4 when necessary as per home medications  Chronic low back pain status post surgeries 08/2014,  01/2015 Continue Norco as above, Cataflam 50 twice a day, baclofen 10 twice a day when necessary spasm, Flexeril 10 3 times a day when necessary  Hypertension Continue amlodipine 5 daily  Appetite Continue Remeron 30 at bedtime  Diabetes mellitus type 2 Holding metformin 500 XL daily for now place on sliding scale  Prior CVa 2/2 to pre-eclampsia  Time spent: 58 DO NOT RESUSCITATE confirmed at bedside with patient and family member Telemetry admission Expect 2-3 days stay and his inpatient requiring at least to midnight still resolved current issue  [place], [place] [facility] Pager [phone]  If 7PM-7AM, please contact night-coverage [email or web address] Password [facility] 12/27/2016, 4:23 PM

## 2016-12-27 NOTE — ED Notes (Signed)
Pt transported to x-ray.

## 2016-12-27 NOTE — ED Notes (Signed)
Gave Pt diet sprite per Dr Madaline Brilliant

## 2016-12-27 NOTE — ED Triage Notes (Signed)
Pt presents with continued weakness since being discharged from here x 2 weeks ago for urosepsis.  Pt was seen yesterday at PCP for follow up with report of elevated WBC.

## 2016-12-27 NOTE — ED Notes (Signed)
Patient transported to CT 

## 2016-12-27 NOTE — ED Notes (Signed)
Patient transported to X-ray

## 2016-12-27 NOTE — ED Notes (Signed)
Pt returned to room from xray.

## 2016-12-28 LAB — COMPREHENSIVE METABOLIC PANEL
ALK PHOS: 54 U/L (ref 38–126)
ALT: 16 U/L (ref 14–54)
AST: 23 U/L (ref 15–41)
Albumin: 2.6 g/dL — ABNORMAL LOW (ref 3.5–5.0)
Anion gap: 11 (ref 5–15)
BILIRUBIN TOTAL: 0.3 mg/dL (ref 0.3–1.2)
BUN: 14 mg/dL (ref 6–20)
CALCIUM: 8.5 mg/dL — AB (ref 8.9–10.3)
CHLORIDE: 104 mmol/L (ref 101–111)
CO2: 24 mmol/L (ref 22–32)
CREATININE: 0.8 mg/dL (ref 0.44–1.00)
GFR calc Af Amer: >60 mL/min (ref >60)
Glucose, Bld: 121 mg/dL — ABNORMAL HIGH (ref 65–99)
Potassium: 2.9 mmol/L — ABNORMAL LOW (ref 3.5–5.1)
Sodium: 139 mmol/L (ref 135–145)
Total Protein: 7 g/dL (ref 6.5–8.1)

## 2016-12-28 LAB — CBC
HCT: 29.3 % — ABNORMAL LOW (ref 36.0–46.0)
Hemoglobin: 9 g/dL — ABNORMAL LOW (ref 12.0–15.0)
MCH: 27.4 pg (ref 26.0–34.0)
MCHC: 30.7 g/dL (ref 30.0–36.0)
MCV: 89.3 fL (ref 78.0–100.0)
PLATELETS: 404 10*3/uL — AB (ref 150–400)
RBC: 3.28 MIL/uL — ABNORMAL LOW (ref 3.87–5.11)
RDW: 13.6 % (ref 11.5–15.5)
WBC: 13.2 10*3/uL — AB (ref 4.0–10.5)

## 2016-12-28 LAB — GLUCOSE, CAPILLARY
Glucose-Capillary: 121 mg/dL — ABNORMAL HIGH (ref 65–99)
Glucose-Capillary: 216 mg/dL — ABNORMAL HIGH (ref 65–99)
Glucose-Capillary: 144 mg/dL — ABNORMAL HIGH (ref 65–99)
Glucose-Capillary: 154 mg/dL — ABNORMAL HIGH (ref 65–99)

## 2016-12-28 MED ORDER — ENOXAPARIN SODIUM 40 MG/0.4ML ~~LOC~~ SOLN
40.0000 mg | SUBCUTANEOUS | Status: DC
  Administered 2016-12-28 – 2016-12-29 (×2): 40 mg via SUBCUTANEOUS
  Filled 2016-12-28 (×2): qty 0.4

## 2016-12-28 MED ORDER — PREMIER PROTEIN SHAKE
11.0000 [oz_av] | Freq: Two times a day (BID) | ORAL | Status: DC
  Administered 2016-12-28 – 2017-01-01 (×3): 11 [oz_av] via ORAL
  Filled 2016-12-28 (×4): qty 325.31

## 2016-12-28 MED ORDER — ALUM & MAG HYDROXIDE-SIMETH 200-200-20 MG/5ML PO SUSP
30.0000 mL | Freq: Two times a day (BID) | ORAL | Status: DC | PRN
  Administered 2016-12-28 – 2016-12-30 (×3): 30 mL via ORAL
  Filled 2016-12-28 (×3): qty 30

## 2016-12-28 MED ORDER — POTASSIUM CHLORIDE CRYS ER 20 MEQ PO TBCR
40.0000 meq | EXTENDED_RELEASE_TABLET | Freq: Two times a day (BID) | ORAL | Status: DC
  Administered 2016-12-28 – 2017-01-01 (×9): 40 meq via ORAL
  Filled 2016-12-28 (×9): qty 2

## 2016-12-28 NOTE — Progress Notes (Signed)
PROGRESS NOTE    [name]  EVO:[id number] DOB: Dec 27, 1931 DOA: 12/27/2016 PCP: [name], PA-C  Outpatient Specialists:     Brief Narrative:   83 y.o. female  Dm ty II Htn CVA CKD III HLD s/p discectomy 3.2016, 08/2014 as wel Dental pain s/p multiple recent extractions [Dr. 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 Patient relates in the presence of her daughter that she has been feeling "terrible" since 12/25/2016   Assessment & Plan:   Active Problems:   Sepsis ([place])   Sepsis Source is unclear but potential he secondary to osteomyelitis So far urine culture is negative Await blood culture and will ask for guidance from infectious disease regarding the same going forward--may need further imaging Get CRP ESR Continue for now.spectrum vancomycin and Zosyn White count 21-13 and patient clinically improved--able to keep better and tolerate the same without nausea  Sinus tachycardia Reactive secondary to SIRS She has not really been taking her Coreg so would place her on home dose 25 twice a day   Recent osteomyelitis of the jaw Completing apparently hyperbaric treatments in [place] Keflex which is supposed to finish 5/27 Can continue Norco one every 4 when necessary as per home medications  Chronic low back pain status post surgeries 08/2014,  01/2015 Continue Norco as above, Cataflam 50 twice a day, baclofen 10 twice a day when necessary spasm, Flexeril 10 3 times a day when necessary  Hypertension Continue amlodipine 5 daily  Reflux Continue Pepcid 20 daily  Appetite Continue Remeron 30 at bedtime  Diabetes mellitus type  2 Holding metformin 500 XL daily for now place on sliding scale Slightly elevated sugars to 16 monitor trend and it stays elevated we'll place on sliding scale  Prior CVa 2/2 to pre-eclampsia  Lovenox Inpatient pending resolution Discussed with daughter at the bedside in person Will need clarification of source of infection and will discuss with infectious disease 1 blood cultures come back  Consultants:   None yet  Procedures:   CT maxillofacial  CT abdomen  Antimicrobials:   Vancomycin 5/26  Zosyn 5/26    Subjective: Patient much better and looks much clearer Walked in hall Eating diet No nausea no vomiting right now Pain in the back is better Jitteriness and shakiness seemed to be better after taking Norco  Objective: Vitals:   12/27/16 1846 12/27/16 2143 12/28/16 0545 12/28/16 1427  BP: (!) 156/82 (!) 130/56 (!) 99/57 (!) 150/62  Pulse: (!) 104 100 86 94  Resp: _0 Temp: (!) 100.7 F (38.2 C) 99.8 F (37.7 C) 98.8 F (37.1 C) 98.6 F (37 C)  TempSrc: Oral   Oral  SpO2: 97% 97%  96%  Weight: 62.3 kg (137 lb 6.4 oz)     Height: _1  (1.575 m)       Intake/Output Summary (Last 24 hours) at 12/28/16 1535 Last data filed at 12/28/16 1428  Gross per 24 hour  Intake              290 ml  Output             1100 ml  Net             -  810 ml   Filed Weights   12/27/16 1846  Weight: 62.3 kg (137 lb 6.4 oz)    Examination:  EOMI NCAT no pallor Neck is soft and supple Upper denture removed no lesions Lower denture showed poor dentition of the teeth # 33, #34    Data Reviewed: I have personally reviewed following labs and imaging studies  CBC:  Recent Labs Lab 12/27/16 1200 12/28/16 0408  WBC 21.6* 13.2*  NEUTROABS 17.6*  --   HGB 10.7* 9.0*  HCT 33.7* 29.3*  MCV 88.7 89.3  PLT 545* 081*   Basic Metabolic Panel:  Recent Labs Lab 12/27/16 1200 12/28/16 0408  NA 138 139  K 3.5 2.9*  CL 100* 104  CO2 26 24  GLUCOSE 217*  121*  BUN 19 14  CREATININE 0.81 0.80  CALCIUM 9.9 8.5*   GFR: Estimated Creatinine Clearance: 46.3 mL/min (by C-G formula based on SCr of 0.8 mg/dL). Liver Function Tests:  Recent Labs Lab 12/27/16 1200 12/28/16 0408  AST 27 23  ALT 17 16  ALKPHOS 69 54  BILITOT 0.3 0.3  PROT 8.9* 7.0  ALBUMIN 3.2* 2.6*   No results for input(s): LIPASE, AMYLASE in the last 168 hours. No results for input(s): AMMONIA in the last 168 hours. Coagulation Profile: No results for input(s): INR, PROTIME in the last 168 hours. Cardiac Enzymes: No results for input(s): CKTOTAL, CKMB, CKMBINDEX, TROPONINI in the last 168 hours. BNP (last 3 results) No results for input(s): PROBNP in the last 8760 hours. HbA1C: No results for input(s): HGBA1C in the last 72 hours. CBG:  Recent Labs Lab 12/27/16 2138 12/28/16 0749 12/28/16 1201  GLUCAP 136* 121* 216*   Lipid Profile: No results for input(s): CHOL, HDL, LDLCALC, TRIG, CHOLHDL, LDLDIRECT in the last 72 hours. Thyroid Function Tests: No results for input(s): TSH, T4TOTAL, FREET4, T3FREE, THYROIDAB in the last 72 hours. Anemia Panel: No results for input(s): VITAMINB12, FOLATE, FERRITIN, TIBC, IRON, RETICCTPCT in the last 72 hours. Urine analysis:    Component Value Date/Time   COLORURINE YELLOW 12/27/2016 1636   APPEARANCEUR CLEAR 12/27/2016 1636   LABSPEC 1.045 (H) 12/27/2016 1636   PHURINE 7.0 12/27/2016 1636   GLUCOSEU NEGATIVE 12/27/2016 1636   HGBUR NEGATIVE 12/27/2016 1636   BILIRUBINUR NEGATIVE 12/27/2016 1636   KETONESUR NEGATIVE 12/27/2016 1636   PROTEINUR NEGATIVE 12/27/2016 1636   UROBILINOGEN 1.0 04/04/2012 1310   NITRITE NEGATIVE 12/27/2016 1636   LEUKOCYTESUR NEGATIVE 12/27/2016 1636   Sepsis Labs: _0 (procalcitonin:4,lacticidven:4)  ) Recent Results (from the past 240 hour(s))  Blood Culture (routine x 2)     Status: None (Preliminary result)   Collection Time: 12/27/16 12:08 PM  Result Value Ref Range  Status   Specimen Description BLOOD RIGHT ANTECUBITAL  Final   Special Requests   Final    BOTTLES DRAWN AEROBIC AND ANAEROBIC Blood Culture adequate volume   Culture NO GROWTH < 24 HOURS  Final   Report Status PENDING  Incomplete  Blood Culture (routine x 2)     Status: None (Preliminary result)   Collection Time: 12/27/16  1:16 PM  Result Value Ref Range Status   Specimen Description BLOOD LEFT ANTECUBITAL  Final   Special Requests   Final    BOTTLES DRAWN AEROBIC AND ANAEROBIC Blood Culture adequate volume   Culture NO GROWTH < 24 HOURS  Final   Report Status PENDING  Incomplete         Radiology Studies: Dg Chest 2 View  Result Date: 12/27/2016  CLINICAL DATA:  Fever EXAM: CHEST  2 VIEW COMPARISON:  12/09/2016 FINDINGS: Improved lung volume and improved aeration in the lung bases since prior study. Lungs are now clear without infiltrate or effusion. Negative for heart failure. Atherosclerotic aorta. IMPRESSION: No active cardiopulmonary disease. Electronically Signed   By: Franchot Gallo M.D.   On: 12/27/2016 12:48   Ct Abdomen Pelvis W Contrast  Result Date: 12/27/2016 CLINICAL DATA:  Continued weakness, leukocytosis, recently hospitalized for urosepsis, vomiting, fever, loss of appetite EXAM: CT ABDOMEN AND PELVIS WITH CONTRAST TECHNIQUE: Multidetector CT imaging of the abdomen and pelvis was performed using the standard protocol following bolus administration of intravenous contrast. Sagittal and coronal MPR images reconstructed from axial data set. CONTRAST:  41m ISOVUE-300 IOPAMIDOL (ISOVUE-300) INJECTION 61% IV. Dilute oral contrast. COMPARISON:  06/30/2014 FINDINGS: Lower chest: Lung bases clear Hepatobiliary: Multiple hepatic cysts. Liver otherwise unremarkable. Question mild gallbladder wall thickening. Mild edema is seen in the pericholecystic fat though this is also seen adjacent to the duodenal bulb, which appears thickened. Gallbladder does not appear to be the epicenter  of this process. No definite calcified gallstones. Pancreas: Normal appearance Spleen: Normal appearance Adrenals/Urinary Tract: Adrenal glands normal appearance. Tiny LEFT renal cysts. Complex mass lateral aspect upper pole RIGHT kidney containing fluid, calcifications, question minimal fat, associated with cortical thinning, question cortical scar/infarct, unchanged. Kidneys, ureters, and bladder otherwise normal appearance. Stomach/Bowel: As noted above, duodenal bulb wall thickening and very duodenal infiltration of fat extending to the gallbladder, raising question of duodenal ulcer disease. Questionable collection of gas is seen within the duodenal wall versus redundant mucosa. Fat planes between the duodenum and pancreatic head appear ill defined though pancreas is otherwise normal in appearance. Distal colonic diverticulosis without evidence of diverticulitis. Nonspecific mildly prominent small bowel loops in the LEFT mid abdomen without evidence of obstruction. Remaining bowel loops unremarkable. Moderate-sized hiatal hernia with remainder of stomach unremarkable. Vascular/Lymphatic: Atherosclerotic calcifications aorta without aneurysm. Additional atherosclerotic calcifications head coronary arteries, iliac arteries and the origins of the celiac artery, renal arteries, and SMA. Reproductive: Uterus surgically absent. Nonvisualization of ovaries. Other: No free air free fluid.  No definite hernia. Musculoskeletal: Bones demineralized with degenerative disc and facet disease changes lumbar spine. IMPRESSION: Duodenal wall thickening with very duodenal infiltrative changes extending to the gallbladder and adjacent to the pancreatic head, raising question of duodenal ulcer disease. No definite extra intestinal gas is identified though a small focus of gas is likely present within the duodenal wall. Hepatic and renal cysts with complex cystic lesion with calcification at the upper pole of the RIGHT kidney,  favor sequela of prior hemorrhage or infarct. Moderate-sized hiatal hernia. Distal colonic diverticulosis without evidence of diverticulitis. Aortic atherosclerosis and coronary arterial calcification as above. Electronically Signed   By: MLavonia DanaM.D.   On: 12/27/2016 16:12   Ct Maxillofacial W Contrast  Result Date: 12/27/2016 CLINICAL DATA:  Abnormal right mandible, concerning for osteomyelitis. History of dry socket in osteomyelitis related to recent dental extractions. EXAM: CT MAXILLOFACIAL WITH CONTRAST TECHNIQUE: MMultidetector CT imaging of the maxillofacial structures was performed with intravenous contrast. Multiplanar CT image reconstructions were also generated. A small metallic BB was placed on the right temple in order to reliably differentiate right from left. COMPARISON:  12/09/2016 FINDINGS: Osseous: Gas still seen within the right molar alveolus, with minimal fluid or debris. Semi-solid material and metallic density seen previously is no longer identified. No soft tissue abscess or inflammation noted. Intermittently seen gas between the tongue and  floor of mouth without definite soft tissue emphysema. No bony sclerosis, erosion, or involucrum to suggest acute osteomyelitis. Orbits: Bilateral cataract resection.  No inflammation or mass. Sinuses: Negative.  No sinusitis or sinus obstruction. Soft tissues: No cellulitic changes noted. Prominent atherosclerotic calcification at the carotid bifurcations. Limited intracranial: Negative IMPRESSION: Persistent dry socket appearance of the right mandibular molar extraction site. No soft tissue abscess or detected cellulitis. No bony changes of osteomyelitis. Electronically Signed   By: Monte Fantasia M.D.   On: 12/27/2016 14:24        Scheduled Meds: . amLODipine  5 mg Oral Daily  . carvedilol  25 mg Oral BID WC  . [START ON 12/29/2016] diclofenac  50 mg Oral BID WC  . enoxaparin (LOVENOX) injection  40 mg Subcutaneous Q24H  .  famotidine  20 mg Oral Daily  . gabapentin  300 mg Oral TID  . mirtazapine  30 mg Oral QHS  . potassium chloride  40 mEq Oral BID  . protein supplement shake  11 oz Oral BID BM   Continuous Infusions: . piperacillin-tazobactam (ZOSYN)  IV 3.375 g (12/28/16 1513)  . vancomycin 1,000 mg (12/28/16 1514)     LOS: 1 day    Time spent: Carmine, MD [facility]   If 7PM-7AM, please contact night-coverage [email or web address] Password [email or web address] 12/28/2016, 3:35 PM

## 2016-12-28 NOTE — Progress Notes (Signed)
Initial Nutrition Assessment  DOCUMENTATION CODES:   Not applicable  INTERVENTION:  - Will order Premier Protein BID, each supplement provides 160 kcal, 30 grams of protein, and 5 grams of carbohydrate.  - Continue to encourage PO intakes of meals, supplements, and beverages. - RD will monitor for additional nutrition-related needs.  NUTRITION DIAGNOSIS:   Inadequate oral intake related to acute illness, nausea, vomiting, poor appetite as evidenced by per patient/family report, meal completion < 25%.  GOAL:   Patient will meet greater than or equal to 90% of their needs  MONITOR:   PO intake, Supplement acceptance, Weight trends, Labs  REASON FOR ASSESSMENT:   Malnutrition Screening Tool  ASSESSMENT:   Patient had several dental extractions performed in February-March 2018. Evaluation revealed infectious concerns in lead to a final diagnosis of osteomyelitis. Last week patient began having night sweats. She developed altered mental status with hallucinations and agitation prompting a visit to the [facility] emergency room last Thursday where patient was diagnosed with UTI and was started on Keflex. Because of persistent issues with lethargy, altered mental status and persistent back pain family brought patient in for further evaluation here in the ER.  Pt seen for MST. BMI indicates overweight status, appropriate for age. No intakes documented since admission. Visualized lunch tray with 5-10% completion. Pt reports that she has had decreased appetite x1 month. Daughter, who is at bedside, confirms and states that she has had to provide pt with ongoing encouragement to eat and drink. Unable to get a better idea of how much pt was eating per day as it was highly variable. Pt states that she had 1 tooth extracted and there are plans to extract 4 more. She denies overt pain when eating but seems to prefer softer, easier to chew foods. PTA pt's daughter had been buying Glucerna for  pt which pt greatly enjoyed and drank 2-3 cans/day. She prefers chocolate flavor only and is agreeable to Premier Protein as it is lower in carb and comes in chocolate flavor.   Physical assessment shows no muscle or fat wasting. Pt reports 15 lb weight loss in the past 1 month. This is consistent with weights in the chart and would indicate 10% wt loss in this time frame which is significant. Pt likely does meet criteria for malnutrition but unable to state based on inability to obtain details about energy intake.   Medications reviewed; 20 mg Pepcid/day, 40 mEq oral KCl BID. Labs reviewed; CBG: 121 mg/dL today, K: 2.9 mmol/L, Ca: 8.5 mg/dL.   Diet Order:  DIET SOFT Room service appropriate? Yes; Fluid consistency: Thin  Skin:  Reviewed, no issues  Last BM:  5/26  Height:   Ht Readings from Last 1 Encounters:  12/27/16 _0  (1.575 m)    Weight:   Wt Readings from Last 1 Encounters:  12/27/16 137 lb 6.4 oz (62.3 kg)    Ideal Body Weight:  50 kg  BMI:  Body mass index is 25.13 kg/m.  Estimated Nutritional Needs:   Kcal:  1560-1745 (25-28 kcal/kg)  Protein:  70-80 grams (1.1-1.3 grams/kg)  Fluid:  >/= 1.8 L/day  EDUCATION NEEDS:   No education needs identified at this time    Jarome Matin, MS, RD, LDN, CNSC Inpatient Clinical Dietitian Pager # [phone] After hours/weekend pager # [phone]

## 2016-12-28 NOTE — Evaluation (Signed)
Physical Therapy Evaluation Patient Details Name: [name] MRN: [id number] DOB: 1932-03-04 Today's Date: 12/28/2016   History of Present Illness  83 y.o. female with medical history significant for diabetes mellitus 2 on oral agents, hypertension, remote history of CVA, CK D3, dyslipidemia and known HNP status post discectomy in March 2016. Patient had several dental extractions performed in February March 2018. Post procedure she had persistent dental pain that was initially felt due to "dry socket". Eventual evaluation revealed infectious concerns in lead to a final diagnosis of osteomyelitis. She was treated with antibiotics by her dentist (Dr. Jonetta Osgood) which have been completed several weeks ago. Plan was to pursue outpatient hyperbaric oxygen treatment at the wound care center in [place] beginning last week the patient was unable to attend secondary to issues related to back pain, she was admitted with sepsis  Clinical Impression  Pt is at or close to baseline functioning and should be safe at home with available assist. There are no further acute PT needs.  Will sign off at this time.     Follow Up Recommendations Home health PT;Supervision - Intermittent    Equipment Recommendations       Recommendations for Other Services       Precautions / Restrictions Precautions Precautions: Fall      Mobility  Bed Mobility Overal bed mobility: Modified Independent                Transfers Overall transfer level: Modified independent                  Ambulation/Gait Ambulation/Gait assistance: Supervision Ambulation Distance (Feet): 250 Feet Assistive device: Rolling walker (2 wheeled);None Gait Pattern/deviations: Step-through pattern Gait velocity: Decreased Gait velocity interpretation: Below normal speed for age/gender General Gait Details: Approx. 10 feet with no AD and guarded, slow.  With RW, more steady, less guarded in general, improved gait  quality.  Still mildly unsteady when distracted.  Stairs Stairs: Yes Stairs assistance: Supervision Stair Management: Two rails;Alternating pattern;Step to pattern;Forwards Number of Stairs: 4 General stair comments: safe with rails  Wheelchair Mobility    Modified Rankin (Stroke Patients Only)       Balance Overall balance assessment: Needs assistance Sitting-balance support: No upper extremity supported Sitting balance-Leahy Scale: Good     Standing balance support: Single extremity supported;Bilateral upper extremity supported Standing balance-Leahy Scale: Fair Standing balance comment: Reliant on RW for stability                              Pertinent Vitals/Pain Pain Assessment: Faces Faces Pain Scale: Hurts a little bit Pain Location: back Pain Descriptors / Indicators: Sore Pain Intervention(s): Monitored during session    Home Living Family/patient expects to be discharged to:: Private residence Living Arrangements: Alone Available Help at Discharge: Family;Available 24 hours/day Type of Home: Mobile home Home Access: Stairs to enter Entrance Stairs-Rails: Right;Left;Can reach both Entrance Stairs-Number of Steps: 3 Home Layout: One level Home Equipment: Walker - standard;Cane - single point;Adaptive equipment;Hand held shower head Additional Comments: Pt's daughter lives right behind her and plans to alternate with other daughter staying with her as needed    Prior Function Level of Independence: Independent with assistive device(s)         Comments: reports recently using walker due to back pain     Hand Dominance   Dominant Hand: Right    Extremity/Trunk Assessment   Upper Extremity Assessment Upper Extremity  Assessment: Overall WFL for tasks assessed    Lower Extremity Assessment Lower Extremity Assessment: Overall WFL for tasks assessed (some mild proximal/core weakness bil, L df weakness) LLE Deficits / Details: L LE  numbness and burning pain at times       Communication   Communication: HOH  Cognition Arousal/Alertness: Awake/alert Behavior During Therapy: WFL for tasks assessed/performed Overall Cognitive Status: Within Functional Limits for tasks assessed                                        General Comments      Exercises     Assessment/Plan    PT Assessment Patient needs continued PT services  PT Problem List Decreased strength;Decreased activity tolerance;Decreased balance;Decreased mobility;Decreased knowledge of use of DME       PT Treatment Interventions      PT Goals (Current goals can be found in the Care Plan section)  Acute Rehab PT Goals Patient Stated Goal: go home PT Goal Formulation: All assessment and education complete, DC therapy    Frequency     Barriers to discharge        Co-evaluation               AM-PAC PT "6 Clicks" Daily Activity  Outcome Measure Difficulty turning over in bed (including adjusting bedclothes, sheets and blankets)?: None Difficulty moving from lying on back to sitting on the side of the bed? : None Difficulty sitting down on and standing up from a chair with arms (e.g., wheelchair, bedside commode, etc,.)?: None Help needed moving to and from a bed to chair (including a wheelchair)?: [name] needed walking in hospital room?: [name] needed climbing 3-5 steps with a railing? : A Little 6 Click Score: 21    End of Session   Activity Tolerance: Patient tolerated treatment well Patient left: in bed;with call bell/phone within reach;with family/visitor present Nurse Communication: Mobility status PT Visit Diagnosis: Other abnormalities of gait and mobility (R26.89);Pain    Time: 1221-1250 PT Time Calculation (min) (ACUTE ONLY): 29 min   Charges:   PT Evaluation $PT Eval Low Complexity: 1 Procedure PT Treatments $Gait Training: 8-22 mins   PT G Codes:        January 07, 2017  Donnella Sham,  PT [phone]  (pager)  [name] 2017-01-07, 1:15 PM

## 2016-12-29 LAB — CBC
HEMATOCRIT: 27.5 % — AB (ref 36.0–46.0)
HEMOGLOBIN: 8.4 g/dL — AB (ref 12.0–15.0)
MCH: 27.3 pg (ref 26.0–34.0)
MCHC: 30.5 g/dL (ref 30.0–36.0)
MCV: 89.3 fL (ref 78.0–100.0)
Platelets: 358 10*3/uL (ref 150–400)
RBC: 3.08 MIL/uL — AB (ref 3.87–5.11)
RDW: 13.8 % (ref 11.5–15.5)
WBC: 10 10*3/uL (ref 4.0–10.5)

## 2016-12-29 LAB — BASIC METABOLIC PANEL
ANION GAP: 8 (ref 5–15)
BUN: 18 mg/dL (ref 6–20)
CALCIUM: 8.2 mg/dL — AB (ref 8.9–10.3)
CO2: 25 mmol/L (ref 22–32)
Chloride: 105 mmol/L (ref 101–111)
Creatinine, Ser: 0.92 mg/dL (ref 0.44–1.00)
GFR calc Af Amer: >60 mL/min (ref >60)
GFR, EST NON AFRICAN AMERICAN: 56 mL/min — AB (ref >60)
Glucose, Bld: 132 mg/dL — ABNORMAL HIGH (ref 65–99)
POTASSIUM: 3.9 mmol/L (ref 3.5–5.1)
Sodium: 138 mmol/L (ref 135–145)

## 2016-12-29 LAB — GLUCOSE, CAPILLARY
GLUCOSE-CAPILLARY: 136 mg/dL — AB (ref 65–99)
GLUCOSE-CAPILLARY: 133 mg/dL — AB (ref 65–99)
GLUCOSE-CAPILLARY: 137 mg/dL — AB (ref 65–99)
Glucose-Capillary: 96 mg/dL (ref 65–99)

## 2016-12-29 LAB — C-REACTIVE PROTEIN: CRP: 2.8 mg/dL — AB (ref <1.0)

## 2016-12-29 LAB — SEDIMENTATION RATE

## 2016-12-29 MED ORDER — CEPHALEXIN 500 MG PO CAPS
500.0000 mg | ORAL_CAPSULE | Freq: Two times a day (BID) | ORAL | Status: DC
Start: 2016-12-29 — End: 2016-12-31
  Administered 2016-12-29 – 2016-12-31 (×5): 500 mg via ORAL
  Filled 2016-12-29 (×5): qty 1

## 2016-12-29 NOTE — Progress Notes (Signed)
PROGRESS NOTE    [name]  KKD:[id number] DOB: [birth year] DOA: 12/27/2016 PCP: [name], PA-C  Outpatient Specialists:     Brief Narrative:   83 y.o. female  Dm ty II Htn CVA CKD III HLD s/p discectomy 3.2016, 08/2014 as wel Dental pain s/p multiple recent extractions [Dr. Lenda Kelp [place]] and Jaw osteomyelitis htn Prior urina retantion L femoral # Prior remote h/o CVA at birth of daughter in [place] colonoscopy 2002  Recent admit 5/6-12/11/16 Jaw osteo-sent home with Keflex-patient is set to receive 4 more doses to complete a treatment for this and is on 4/40 treatments of hyperbaric oxygen to help heal this area. Patient relates in the presence of her daughter that she has been feeling "terrible" since 12/25/2016   Assessment & Plan:   Active Problems:   Sepsis ([place])   Sepsis Source is unclear but potential he secondary to osteomyelitis So far urine culture is negative, blood culture also negative Both ESR and CRP are elevated Get transthoracic echocardiogram Transition off of vancomycin and Zosyn-->Keflex(500 twice a day If echocardiogram is negative will ask for course of duration of treatment for presumed osteomyelitis White count 21-13-->10 and patient clinically improved--able to keep better and tolerate the same without nausea  Sinus tachycardia Reactive secondary to SIRS She has not really been taking her Coreg so would place her on home dose 25 twice a day  Sinus tachycardia is resolved  Recent osteomyelitis of the jaw Completing apparently hyperbaric treatments in [place] Can continue Norco one every 4 when necessary as per home medications  Chronic low back pain status post surgeries 08/2014,  01/2015 Continue Norco as above, Cataflam 50 twice a day, baclofen 10 twice a day when necessary spasm, Flexeril 10 3 times a day when necessary  Hypertension Continue amlodipine 5 daily Blood pressure is controlled  Reflux Continue Pepcid  20 daily  Appetite Continue Remeron 30 at bedtime  Diabetes mellitus type 2 Holding metformin 500 XL daily for now place on sliding scale Sugars 130s  Prior CVa 2/2 to pre-eclampsia  Lovenox Inpatient pending resolution Discussed with daughter at the bedside in person 5/26, 5/27 Not ready for discharge Get echocardiogram rule out endocarditis and will discuss with ID 1 cultures finalize regarding potential duration of Keflex  Consultants:   None yet  Procedures:   CT maxillofacial  CT abdomen  Echocardiogram is pending  Antimicrobials:   Vancomycin 5/26-5/27  Zosyn 5/26 -5/27   Subjective:  Awake alert pleasant oriented no distress Tolerating diet Pain seems reasonably controlled although has some pain in the leg which is chronic Sitting in the chair No fever no chills Eating  Objective: Vitals:   12/28/16 0545 12/28/16 1427 12/28/16 2114 12/29/16 0451  BP: (!) 99/57 (!) 150/62 (!) 122/51 139/60  Pulse: 86 94 79 84  Resp: _0 Temp: 98.8 F (37.1 C) 98.6 F (37 C) 99.2 F (37.3 C) 98.1 F (36.7 C)  TempSrc:  Oral Oral Oral  SpO2:  96% 92% 92%  Weight:      Height:        Intake/Output Summary (Last 24 hours) at 12/29/16 1227 Last data filed at 12/29/16 0606  Gross per 24 hour  Intake              520 ml  Output                0 ml  Net  520 ml   Filed Weights   12/27/16 1846  Weight: 62.3 kg (137 lb 6.4 oz)    Examination:  EOMI NCAT no pallor Dentition not reviewed Neck is soft and supple Chest clinically clear no added sound Abdomen is soft nontender      Data Reviewed: I have personally reviewed following labs and imaging studies  CBC:  Recent Labs Lab 12/27/16 1200 12/28/16 0408 12/29/16 0529  WBC 21.6* 13.2* 10.0  NEUTROABS 17.6*  --   --   HGB 10.7* 9.0* 8.4*  HCT 33.7* 29.3* 27.5*  MCV 88.7 89.3 89.3  PLT 545* 404* 354   Basic Metabolic Panel:  Recent Labs Lab 12/27/16 1200  12/28/16 0408 12/29/16 0529  NA 138 139 138  K 3.5 2.9* 3.9  CL 100* 104 105  CO2 _0 GLUCOSE 217* 121* 132*  BUN _1 CREATININE 0.81 0.80 0.92  CALCIUM 9.9 8.5* 8.2*   GFR: Estimated Creatinine Clearance: 40.2 mL/min (by C-G formula based on SCr of 0.92 mg/dL). Liver Function Tests:  Recent Labs Lab 12/27/16 1200 12/28/16 0408  AST 27 23  ALT 17 16  ALKPHOS 69 54  BILITOT 0.3 0.3  PROT 8.9* 7.0  ALBUMIN 3.2* 2.6*   No results for input(s): LIPASE, AMYLASE in the last 168 hours. No results for input(s): AMMONIA in the last 168 hours. Coagulation Profile: No results for input(s): INR, PROTIME in the last 168 hours. Cardiac Enzymes: No results for input(s): CKTOTAL, CKMB, CKMBINDEX, TROPONINI in the last 168 hours. BNP (last 3 results) No results for input(s): PROBNP in the last 8760 hours. HbA1C: No results for input(s): HGBA1C in the last 72 hours. CBG:  Recent Labs Lab 12/28/16 1201 12/28/16 1658 12/28/16 2113 12/29/16 0800 12/29/16 1212  GLUCAP 216* 144* 154* 136* 133*   Lipid Profile: No results for input(s): CHOL, HDL, LDLCALC, TRIG, CHOLHDL, LDLDIRECT in the last 72 hours. Thyroid Function Tests: No results for input(s): TSH, T4TOTAL, FREET4, T3FREE, THYROIDAB in the last 72 hours. Anemia Panel: No results for input(s): VITAMINB12, FOLATE, FERRITIN, TIBC, IRON, RETICCTPCT in the last 72 hours. Urine analysis:    Component Value Date/Time   COLORURINE YELLOW 12/27/2016 1636   APPEARANCEUR CLEAR 12/27/2016 1636   LABSPEC 1.045 (H) 12/27/2016 1636   PHURINE 7.0 12/27/2016 1636   GLUCOSEU NEGATIVE 12/27/2016 1636   HGBUR NEGATIVE 12/27/2016 1636   BILIRUBINUR NEGATIVE 12/27/2016 1636   KETONESUR NEGATIVE 12/27/2016 1636   PROTEINUR NEGATIVE 12/27/2016 1636   UROBILINOGEN 1.0 04/04/2012 1310   NITRITE NEGATIVE 12/27/2016 1636   LEUKOCYTESUR NEGATIVE 12/27/2016 1636   Sepsis Labs: _2 (procalcitonin:4,lacticidven:4)  ) Recent  Results (from the past 240 hour(s))  Blood Culture (routine x 2)     Status: None (Preliminary result)   Collection Time: 12/27/16 12:08 PM  Result Value Ref Range Status   Specimen Description BLOOD RIGHT ANTECUBITAL  Final   Special Requests   Final    BOTTLES DRAWN AEROBIC AND ANAEROBIC Blood Culture adequate volume   Culture NO GROWTH 2 DAYS  Final   Report Status PENDING  Incomplete  Blood Culture (routine x 2)     Status: None (Preliminary result)   Collection Time: 12/27/16  1:16 PM  Result Value Ref Range Status   Specimen Description BLOOD LEFT ANTECUBITAL  Final   Special Requests   Final    BOTTLES DRAWN AEROBIC AND ANAEROBIC Blood Culture adequate volume   Culture NO GROWTH 2 DAYS  Final  Report Status PENDING  Incomplete         Radiology Studies: Dg Chest 2 View  Result Date: 12/27/2016 CLINICAL DATA:  Fever EXAM: CHEST  2 VIEW COMPARISON:  12/09/2016 FINDINGS: Improved lung volume and improved aeration in the lung bases since prior study. Lungs are now clear without infiltrate or effusion. Negative for heart failure. Atherosclerotic aorta. IMPRESSION: No active cardiopulmonary disease. Electronically Signed   By: Franchot Gallo M.D.   On: 12/27/2016 12:48   Ct Abdomen Pelvis W Contrast  Result Date: 12/27/2016 CLINICAL DATA:  Continued weakness, leukocytosis, recently hospitalized for urosepsis, vomiting, fever, loss of appetite EXAM: CT ABDOMEN AND PELVIS WITH CONTRAST TECHNIQUE: Multidetector CT imaging of the abdomen and pelvis was performed using the standard protocol following bolus administration of intravenous contrast. Sagittal and coronal MPR images reconstructed from axial data set. CONTRAST:  6m ISOVUE-300 IOPAMIDOL (ISOVUE-300) INJECTION 61% IV. Dilute oral contrast. COMPARISON:  06/30/2014 FINDINGS: Lower chest: Lung bases clear Hepatobiliary: Multiple hepatic cysts. Liver otherwise unremarkable. Question mild gallbladder wall thickening. Mild edema is  seen in the pericholecystic fat though this is also seen adjacent to the duodenal bulb, which appears thickened. Gallbladder does not appear to be the epicenter of this process. No definite calcified gallstones. Pancreas: Normal appearance Spleen: Normal appearance Adrenals/Urinary Tract: Adrenal glands normal appearance. Tiny LEFT renal cysts. Complex mass lateral aspect upper pole RIGHT kidney containing fluid, calcifications, question minimal fat, associated with cortical thinning, question cortical scar/infarct, unchanged. Kidneys, ureters, and bladder otherwise normal appearance. Stomach/Bowel: As noted above, duodenal bulb wall thickening and very duodenal infiltration of fat extending to the gallbladder, raising question of duodenal ulcer disease. Questionable collection of gas is seen within the duodenal wall versus redundant mucosa. Fat planes between the duodenum and pancreatic head appear ill defined though pancreas is otherwise normal in appearance. Distal colonic diverticulosis without evidence of diverticulitis. Nonspecific mildly prominent small bowel loops in the LEFT mid abdomen without evidence of obstruction. Remaining bowel loops unremarkable. Moderate-sized hiatal hernia with remainder of stomach unremarkable. Vascular/Lymphatic: Atherosclerotic calcifications aorta without aneurysm. Additional atherosclerotic calcifications head coronary arteries, iliac arteries and the origins of the celiac artery, renal arteries, and SMA. Reproductive: Uterus surgically absent. Nonvisualization of ovaries. Other: No free air free fluid.  No definite hernia. Musculoskeletal: Bones demineralized with degenerative disc and facet disease changes lumbar spine. IMPRESSION: Duodenal wall thickening with very duodenal infiltrative changes extending to the gallbladder and adjacent to the pancreatic head, raising question of duodenal ulcer disease. No definite extra intestinal gas is identified though a small focus of  gas is likely present within the duodenal wall. Hepatic and renal cysts with complex cystic lesion with calcification at the upper pole of the RIGHT kidney, favor sequela of prior hemorrhage or infarct. Moderate-sized hiatal hernia. Distal colonic diverticulosis without evidence of diverticulitis. Aortic atherosclerosis and coronary arterial calcification as above. Electronically Signed   By: MLavonia DanaM.D.   On: 12/27/2016 16:12   Ct Maxillofacial W Contrast  Result Date: 12/27/2016 CLINICAL DATA:  Abnormal right mandible, concerning for osteomyelitis. History of dry socket in osteomyelitis related to recent dental extractions. EXAM: CT MAXILLOFACIAL WITH CONTRAST TECHNIQUE: MMultidetector CT imaging of the maxillofacial structures was performed with intravenous contrast. Multiplanar CT image reconstructions were also generated. A small metallic BB was placed on the right temple in order to reliably differentiate right from left. COMPARISON:  12/09/2016 FINDINGS: Osseous: Gas still seen within the right molar alveolus, with minimal fluid or debris. Semi-solid material  and metallic density seen previously is no longer identified. No soft tissue abscess or inflammation noted. Intermittently seen gas between the tongue and floor of mouth without definite soft tissue emphysema. No bony sclerosis, erosion, or involucrum to suggest acute osteomyelitis. Orbits: Bilateral cataract resection.  No inflammation or mass. Sinuses: Negative.  No sinusitis or sinus obstruction. Soft tissues: No cellulitic changes noted. Prominent atherosclerotic calcification at the carotid bifurcations. Limited intracranial: Negative IMPRESSION: Persistent dry socket appearance of the right mandibular molar extraction site. No soft tissue abscess or detected cellulitis. No bony changes of osteomyelitis. Electronically Signed   By: Monte Fantasia M.D.   On: 12/27/2016 14:24        Scheduled Meds: . amLODipine  5 mg Oral Daily  .  carvedilol  25 mg Oral BID WC  . cephALEXin  500 mg Oral Q12H  . diclofenac  50 mg Oral BID WC  . enoxaparin (LOVENOX) injection  40 mg Subcutaneous Q24H  . famotidine  20 mg Oral Daily  . gabapentin  300 mg Oral TID  . mirtazapine  30 mg Oral QHS  . potassium chloride  40 mEq Oral BID  . protein supplement shake  11 oz Oral BID BM   Continuous Infusions:    LOS: 2 days    Time spent: [place], MD [facility] ([facility]   If 7PM-7AM, please contact night-coverage [email or web address] Password [facility] 12/29/2016, 12:27 PM

## 2016-12-30 ENCOUNTER — Other Ambulatory Visit (HOSPITAL_COMMUNITY): Payer: Medicare Other

## 2016-12-30 LAB — BASIC METABOLIC PANEL
ANION GAP: 10 (ref 5–15)
BUN: 14 mg/dL (ref 6–20)
CO2: 20 mmol/L — ABNORMAL LOW (ref 22–32)
Calcium: 8.5 mg/dL — ABNORMAL LOW (ref 8.9–10.3)
Chloride: 107 mmol/L (ref 101–111)
Creatinine, Ser: 0.73 mg/dL (ref 0.44–1.00)
GFR calc Af Amer: >60 mL/min (ref >60)
Glucose, Bld: 149 mg/dL — ABNORMAL HIGH (ref 65–99)
POTASSIUM: 4.8 mmol/L (ref 3.5–5.1)
SODIUM: 137 mmol/L (ref 135–145)

## 2016-12-30 LAB — HEMOGLOBIN AND HEMATOCRIT, BLOOD
HCT: 22.2 % — ABNORMAL LOW (ref 36.0–46.0)
HEMATOCRIT: 28.2 % — AB (ref 36.0–46.0)
HEMOGLOBIN: 8.5 g/dL — AB (ref 12.0–15.0)
Hemoglobin: 6.8 g/dL — CL (ref 12.0–15.0)

## 2016-12-30 LAB — GLUCOSE, CAPILLARY
GLUCOSE-CAPILLARY: 161 mg/dL — AB (ref 65–99)
GLUCOSE-CAPILLARY: 234 mg/dL — AB (ref 65–99)
GLUCOSE-CAPILLARY: 162 mg/dL — AB (ref 65–99)

## 2016-12-30 LAB — CBC WITH DIFFERENTIAL/PLATELET
BASOS ABS: 0 10*3/uL (ref 0.0–0.1)
Basophils Relative: 0 %
EOS ABS: 0.6 10*3/uL (ref 0.0–0.7)
Eosinophils Relative: 5 %
HCT: 29.6 % — ABNORMAL LOW (ref 36.0–46.0)
HEMOGLOBIN: 8.9 g/dL — AB (ref 12.0–15.0)
LYMPHS ABS: 2.5 10*3/uL (ref 0.7–4.0)
LYMPHS PCT: 21 %
MCH: 27.2 pg (ref 26.0–34.0)
MCHC: 30.1 g/dL (ref 30.0–36.0)
MCV: 90.5 fL (ref 78.0–100.0)
Monocytes Absolute: 0.7 10*3/uL (ref 0.1–1.0)
Monocytes Relative: 6 %
Neutro Abs: 8 10*3/uL — ABNORMAL HIGH (ref 1.7–7.7)
Neutrophils Relative %: 68 %
Platelets: 394 10*3/uL (ref 150–400)
RBC: 3.27 MIL/uL — AB (ref 3.87–5.11)
RDW: 13.7 % (ref 11.5–15.5)
WBC: 11.8 10*3/uL — AB (ref 4.0–10.5)

## 2016-12-30 LAB — PREPARE RBC (CROSSMATCH)

## 2016-12-30 MED ORDER — ACETAMINOPHEN 325 MG PO TABS
650.0000 mg | ORAL_TABLET | Freq: Once | ORAL | Status: AC
  Administered 2016-12-30: 650 mg via ORAL
  Filled 2016-12-30: qty 2

## 2016-12-30 MED ORDER — FUROSEMIDE 10 MG/ML IJ SOLN
20.0000 mg | Freq: Once | INTRAMUSCULAR | Status: AC
  Administered 2016-12-30: 20 mg via INTRAVENOUS
  Filled 2016-12-30: qty 2

## 2016-12-30 MED ORDER — PANTOPRAZOLE SODIUM 40 MG IV SOLR
40.0000 mg | Freq: Two times a day (BID) | INTRAVENOUS | Status: DC
  Administered 2016-12-30 – 2016-12-31 (×3): 40 mg via INTRAVENOUS
  Filled 2016-12-30 (×3): qty 40

## 2016-12-30 MED ORDER — DIPHENHYDRAMINE HCL 25 MG PO CAPS
25.0000 mg | ORAL_CAPSULE | Freq: Once | ORAL | Status: AC
  Administered 2016-12-30: 25 mg via ORAL
  Filled 2016-12-30: qty 1

## 2016-12-30 MED ORDER — SODIUM CHLORIDE 0.9 % IV SOLN
Freq: Once | INTRAVENOUS | Status: AC
  Administered 2016-12-30: 16:00:00 via INTRAVENOUS

## 2016-12-30 MED ORDER — SODIUM CHLORIDE 0.9 % IV SOLN
INTRAVENOUS | Status: DC
  Administered 2016-12-31: 03:00:00 via INTRAVENOUS

## 2016-12-30 MED ORDER — ONDANSETRON 4 MG PO TBDP
4.0000 mg | ORAL_TABLET | Freq: Three times a day (TID) | ORAL | Status: DC | PRN
  Administered 2016-12-30: 4 mg via ORAL
  Filled 2016-12-30: qty 1

## 2016-12-30 NOTE — Progress Notes (Signed)
Patient awakened by warm feeling (thinking she had a voiding accident).  Blood present in panties and bed.  Patient voided in bathroom and blood present (approximate 100cc's).  New issue on this admission.  Assessment completed and blood does not appear to becoming from rectal area.  Will continue to monitor. Wilson Singer, RN

## 2016-12-30 NOTE — Progress Notes (Signed)
PROGRESS NOTE    [name]  HEN:[id number] DOB: 21-Mar-1932 DOA: 12/27/2016 PCP: [name], PA-C  Outpatient Specialists:     Brief Narrative:   83 y.o. female  Dm ty II Htn CVA CKD III HLD s/p discectomy 3.2016, 08/2014 as wel Dental pain s/p multiple recent extractions [Dr. Lenda Kelp [place]] and Jaw osteomyelitis htn Prior urina retantion L femoral # Prior remote h/o CVA at birth of daughter in [place] colonoscopy 2002  Recent admit 5/6-12/11/16 Jaw osteo-sent home with Keflex-patient is set to receive 4 more doses to complete a treatment for this and is on 4/40 treatments of hyperbaric oxygen to help heal this area. Patient relates in the presence of her daughter that she has been feeling "terrible" since 12/25/2016  deveoped brb per rectum 5/28 and gi consulted for eval  Assessment & Plan:   Active Problems:   Sepsis ([place])   Possible GI bleed Sounds like brisk bleeding either hemorrhoidal vs diverticulosis given descrition brb with cranberry sauce clot consistentcy distal to ligament  ftrietz Holding cataflam for pain [started this admit] Appreciate GI input Type and screen and check hemoglobin q 12 -hold trasnfusion unless hemoglobin below 7 ?scope this am  Sepsis Source is unclear but potential he secondary to osteomyelitis So far urine culture is negative, blood culture also negative Both ESR and CRP are elevated Get transesophageal echocardiogram Transition off of vancomycin and Zosyn-->Keflex 500 twice a day If echocardiogram is negative will ask for course of duration of treatment for presumed osteomyelitis White count 21-13--10-->11.8 and patient clinically improved--able to keep better and tolerate the same without nausea  Sinus tachycardia Reactive secondary to SIRS She has not really been taking her Coreg so would place her on home dose 25 twice a day  Sinus tachycardia is resolved  Recent osteomyelitis of the jaw Completing  apparently hyperbaric treatments in [place] Can continue Norco one every 4 when necessary as per home medications  Chronic low back pain status post surgeries 08/2014,  01/2015 Continue Norco as above, Cataflam 50 twice a day, baclofen 10 twice a day when necessary spasm, Flexeril 10 3 times a day when necessary Stable currently May need to cut back opiates or place on good laxative course  Hypertension Continue amlodipine 5 daily Blood pressure is controlled  Reflux Continue Pepcid 20 daily  Appetite Continue Remeron 30 at bedtime  Diabetes mellitus type 2 Holding metformin 500 XL daily for now place on sliding scale Sugars 130--147  Prior CVa 2/2 to pre-eclampsia  Lovenox-->SCD 5/28 Needs Endoscopy/colonoscopy Not ready for d/c Get echocardiogram rule out endocarditis and will discuss with ID 1 cultures finalize regarding potential duration of Keflex  Consultants:   None yet  Procedures:   CT maxillofacial  CT abdomen  Echocardiogram is pending  Antimicrobials:   Vancomycin 5/26-5/27  Zosyn 5/26 -5/27   Subjective:  Overnight bleeding noted Nursing informs has had more episodes today No cp  Some n this am No abd pain   Objective: Vitals:   12/29/16 1538 12/29/16 1540 12/29/16 2141 12/30/16 0535  BP: (!) 129/52  (!) 147/73 (!) 150/70  Pulse:  78 73 89  Resp: _0 Temp:   98.5 F (36.9 C) 98.9 F (37.2 C)  TempSrc:   Oral Oral  SpO2:  99% 97% 95%  Weight:      Height:       No intake or output data in the 24 hours ending 12/30/16 0857 Filed Weights   12/27/16  1846  Weight: 62.3 kg (137 lb 6.4 oz)    Examination:  EOMI NCAT no pallor Dentition not reviewed Neck is soft and supple Chest clinically clear no added sound Abdomen is soft nontender, external thrombosed hmeorrhoids-12 and 6 oclock Reg tinged glove after rectal      Data Reviewed: I have personally reviewed following labs and imaging studies  CBC:  Recent  Labs Lab 12/27/16 1200 12/28/16 0408 12/29/16 0529 12/30/16 0544 12/30/16 0815  WBC 21.6* 13.2* 10.0 11.8*  --   NEUTROABS 17.6*  --   --  8.0*  --   HGB 10.7* 9.0* 8.4* 8.9* 8.5*  HCT 33.7* 29.3* 27.5* 29.6* 28.2*  MCV 88.7 89.3 89.3 90.5  --   PLT 545* 404* 358 394  --    Basic Metabolic Panel:  Recent Labs Lab 12/27/16 1200 12/28/16 0408 12/29/16 0529 12/30/16 0544  NA 138 139 138 137  K 3.5 2.9* 3.9 4.8  CL 100* 104 105 107  CO2 _0 20*  GLUCOSE 217* 121* 132* 149*  BUN _1 CREATININE 0.81 0.80 0.92 0.73  CALCIUM 9.9 8.5* 8.2* 8.5*   GFR: Estimated Creatinine Clearance: 46.3 mL/min (by C-G formula based on SCr of 0.73 mg/dL). Liver Function Tests:  Recent Labs Lab 12/27/16 1200 12/28/16 0408  AST 27 23  ALT 17 16  ALKPHOS 69 54  BILITOT 0.3 0.3  PROT 8.9* 7.0  ALBUMIN 3.2* 2.6*   No results for input(s): LIPASE, AMYLASE in the last 168 hours. No results for input(s): AMMONIA in the last 168 hours. Coagulation Profile: No results for input(s): INR, PROTIME in the last 168 hours. Cardiac Enzymes: No results for input(s): CKTOTAL, CKMB, CKMBINDEX, TROPONINI in the last 168 hours. BNP (last 3 results) No results for input(s): PROBNP in the last 8760 hours. HbA1C: No results for input(s): HGBA1C in the last 72 hours. CBG:  Recent Labs Lab 12/29/16 0800 12/29/16 1212 12/29/16 1720 12/29/16 2139 12/30/16 0800  GLUCAP 136* 133* 96 137* 161*   Lipid Profile: No results for input(s): CHOL, HDL, LDLCALC, TRIG, CHOLHDL, LDLDIRECT in the last 72 hours. Thyroid Function Tests: No results for input(s): TSH, T4TOTAL, FREET4, T3FREE, THYROIDAB in the last 72 hours. Anemia Panel: No results for input(s): VITAMINB12, FOLATE, FERRITIN, TIBC, IRON, RETICCTPCT in the last 72 hours. Urine analysis:    Component Value Date/Time   COLORURINE YELLOW 12/27/2016 [place] 12/27/2016 1636   LABSPEC 1.045 (H) 12/27/2016 1636   PHURINE  7.0 12/27/2016 1636   GLUCOSEU NEGATIVE 12/27/2016 1636   HGBUR NEGATIVE 12/27/2016 1636   BILIRUBINUR NEGATIVE 12/27/2016 1636   KETONESUR NEGATIVE 12/27/2016 1636   PROTEINUR NEGATIVE 12/27/2016 1636   UROBILINOGEN 1.0 04/04/2012 1310   NITRITE NEGATIVE 12/27/2016 1636   LEUKOCYTESUR NEGATIVE 12/27/2016 1636   Sepsis Labs: _2 (procalcitonin:4,lacticidven:4)  ) Recent Results (from the past 240 hour(s))  Blood Culture (routine x 2)     Status: None (Preliminary result)   Collection Time: 12/27/16 12:08 PM  Result Value Ref Range Status   Specimen Description BLOOD RIGHT ANTECUBITAL  Final   Special Requests   Final    BOTTLES DRAWN AEROBIC AND ANAEROBIC Blood Culture adequate volume   Culture NO GROWTH 2 DAYS  Final   Report Status PENDING  Incomplete  Blood Culture (routine x 2)     Status: None (Preliminary result)   Collection Time: 12/27/16  1:16 PM  Result Value Ref Range Status   Specimen  Description BLOOD LEFT ANTECUBITAL  Final   Special Requests   Final    BOTTLES DRAWN AEROBIC AND ANAEROBIC Blood Culture adequate volume   Culture NO GROWTH 2 DAYS  Final   Report Status PENDING  Incomplete         Radiology Studies: No results found.      Scheduled Meds: . acetaminophen  650 mg Oral Once  . amLODipine  5 mg Oral Daily  . carvedilol  25 mg Oral BID WC  . cephALEXin  500 mg Oral Q12H  . diclofenac  50 mg Oral BID WC  . diphenhydrAMINE  25 mg Oral Once  . famotidine  20 mg Oral Daily  . furosemide  20 mg Intravenous Once  . gabapentin  300 mg Oral TID  . mirtazapine  30 mg Oral QHS  . potassium chloride  40 mEq Oral BID  . protein supplement shake  11 oz Oral BID BM   Continuous Infusions: . sodium chloride       LOS: 3 days    Time spent: [place], MD [facility] ([facility]   If 7PM-7AM, please contact night-coverage [email or web address] Password [facility] 12/30/2016, 8:57 AM

## 2016-12-30 NOTE — Consult Note (Addendum)
Referring Provider:  Dr. Verlon Au Primary Care Physician:  [name], PA-C Primary Gastroenterologist:  Althia Forts  Reason for Consultation:  GI bleed  HPI: [name] is a 83 y.o. 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 Patient was initially admitted earlier this month for possible osteomyelitis of right mandible along with possible sepsis. Patient was discharged after 2 days of admission. She came into the ER again on May 25 with complaint of weakness. Patient was found to rectal bleeding this morning. GI is consulted for further evaluation.  Patient seen and examined at bedside. According to patient she had a bowel movement this morning and nursing staff noted maroon-colored blood along with blood clots. Patient denied any abdominal pain or abdominal cramps prior to bowel movements. Denied any vomiting. Complaining of some nausea.  Last colonoscopy was in 2002 which was unremarkable according to patient. No family history of colon cancer.    Past Medical History:  Diagnosis Date  . Anxiety   . Arthritis    osteo; "mostly hands, knees, probably back too" (12/09/2016)  . Chronic lower back pain   . Complication of anesthesia 2011   resp distress -on vent after surgery  . Depression   . Esophageal stricture   . GERD (gastroesophageal reflux disease)   . Headache    out grew them  . High cholesterol   . History of blood transfusion 2011; ?03/2012   "related to ORs" (12/09/2016)  . Hypertension   . Intestinal obstruction ([place])   . Macular degeneration of left eye    dx over 55 yrs ago.....hasn't changed much  . Shingles   . Squamous carcinoma 2013   squamas cell on scalp--took 14 radiation tx--2013  . Stroke [facility]) 1967   Mini stroke;  No lasting deficits  . Type II diabetes mellitus ([place]) dx'd 11/2016    Past Surgical History:   Procedure Laterality Date  . ABDOMINAL HYSTERECTOMY    . APPENDECTOMY    . BACK SURGERY    . CATARACT EXTRACTION, BILATERAL Bilateral   . CHOLECYSTECTOMY    . COLON SURGERY    . COLONOSCOPY    . ESOPHAGOGASTRODUODENOSCOPY (EGD) WITH ESOPHAGEAL DILATION  "several times"  . EYE SURGERY    . FRACTURE SURGERY    . JOINT REPLACEMENT    . LUMBAR DISC SURGERY  10/25/2014   Right L4-L5 removal of seven free fragments of disk mostly posterolaterally.  Lysis of adhesion.  Microscope/notes 10/26/2014  . LUMBAR LAMINECTOMY/DECOMPRESSION MICRODISCECTOMY Right 08/09/2014   Procedure: Right Lumbar Four to Five Microdiskectomy;  Surgeon: Floyce Stakes, MD;  Location: [facility] NEURO ORS;  Service: Neurosurgery;  Laterality: Right;  Right L4-5 Microdiskectomy  . ORIF PERIPROSTHETIC FRACTURE  03/31/2012   Procedure: OPEN REDUCTION INTERNAL FIXATION (ORIF) PERIPROSTHETIC FRACTURE;  Surgeon: Mauri Pole, MD;  Location: [facility] ORS;  Service: Orthopedics;  Laterality: Left;  Open reduction internal fixation Left distal femur periprosthetic fracture  . SMALL INTESTINE SURGERY  2011   "really bad blockage; went into respiratory distress and was on ventilator after surgery; [facility]"  . SQUAMOUS CELL CARCINOMA EXCISION  ~ 2012   S/P "cut off her head then 15 radiation txs"   . TOTAL KNEE ARTHROPLASTY Bilateral    bilateral    Prior to Admission medications   Medication Sig Start Date End Date Taking? Authorizing Provider  amLODipine (NORVASC) 5 MG  tablet Take 5 mg by mouth daily.   Yes [provider]  atorvastatin (LIPITOR) 40 MG tablet Take 40 mg by mouth daily.   Yes [provider]  baclofen (LIORESAL) 10 MG tablet Take 10 mg by mouth 2 (two) times daily as needed for muscle spasms.   Yes [provider]  carvedilol (COREG) 25 MG tablet Take 25 mg by mouth 2 (two) times daily with a meal.   Yes [provider]  cyclobenzaprine (FLEXERIL) 10 MG tablet Take 1 tablet (10  mg total) by mouth 3 (three) times daily as needed for muscle spasms. 10/21/14  Yes Leonard Schwartz, MD  diclofenac (CATAFLAM) 50 MG tablet Take 50 mg by mouth 2 (two) times daily.   Yes [provider]  gabapentin (NEURONTIN) 300 MG capsule Take 300 mg by mouth 3 (three) times daily.   Yes [provider]  HYDROcodone-acetaminophen (NORCO/VICODIN) 5-325 MG tablet Take 1 tablet by mouth every 4 (four) hours as needed for moderate pain.   Yes [provider]  losartan-hydrochlorothiazide (HYZAAR) 100-25 MG per tablet Take 1 tablet by mouth daily.   Yes [provider]  mirtazapine (REMERON) 30 MG tablet Take 30 mg by mouth at bedtime.   Yes [provider]  ranitidine (ZANTAC) 150 MG tablet Take 150 mg by mouth daily.   Yes [provider]  metFORMIN (GLUCOPHAGE-XR) 500 MG 24 hr tablet Take 500 mg by mouth daily with breakfast.    [provider]  traMADol-acetaminophen (ULTRACET) 37.5-325 MG per tablet Take 2 tablets by mouth 4 (four) times daily as needed (pain).    [provider]    Scheduled Meds: . acetaminophen  650 mg Oral Once  . amLODipine  5 mg Oral Daily  . carvedilol  25 mg Oral BID WC  . cephALEXin  500 mg Oral Q12H  . diclofenac  50 mg Oral BID WC  . diphenhydrAMINE  25 mg Oral Once  . famotidine  20 mg Oral Daily  . furosemide  20 mg Intravenous Once  . gabapentin  300 mg Oral TID  . mirtazapine  30 mg Oral QHS  . potassium chloride  40 mEq Oral BID  . protein supplement shake  11 oz Oral BID BM   Continuous Infusions: . sodium chloride     PRN Meds:.alum & mag hydroxide-simeth, baclofen, cyclobenzaprine, HYDROcodone-acetaminophen  Allergies as of 12/27/2016 - Review Complete 12/27/2016  Allergen Reaction Noted  . Sulfa antibiotics Swelling 03/30/2012    History reviewed. No pertinent family history.  Social History   Social History  . Marital status: Widowed    Spouse name: N/A  . Number of  children: N/A  . Years of education: N/A   Occupational History  . Not on file.   Social History Main Topics  . Smoking status: Former Smoker    Packs/day: 0.10    Years: 10.00    Types: Cigarettes    Quit date: 03/31/1991  . Smokeless tobacco: Never Used  . Alcohol use No  . Drug use: No  . Sexual activity: No   Other Topics Concern  . Not on file   Social History Narrative  . No narrative on file    Review of Systems: Review of Systems  Constitutional: Positive for malaise/fatigue. Negative for fever.  HENT: Negative for ear discharge, ear pain, hearing loss and tinnitus.   Eyes: Negative for blurred vision, double vision and photophobia.  Respiratory: Negative for cough, hemoptysis and sputum production.  Cardiovascular: Positive for chest pain. Negative for palpitations and orthopnea.  Gastrointestinal: Positive for blood in stool, heartburn and nausea. Negative for abdominal pain, diarrhea and vomiting.  Genitourinary: Negative for dysuria and urgency.  Musculoskeletal: Positive for myalgias. Negative for joint pain and neck pain.  Skin: Negative for rash.  Neurological: Positive for weakness. Negative for focal weakness and seizures.  Endo/Heme/Allergies: Does not bruise/bleed easily.  Psychiatric/Behavioral: Negative for hallucinations and suicidal ideas.    Physical Exam: Vital signs: Vitals:   12/29/16 2141 12/30/16 0535  BP: (!) 147/73 (!) 150/70  Pulse: 73 89  Resp: 18 18  Temp: 98.5 F (36.9 C) 98.9 F (37.2 C)   Last BM Date: 12/29/16 General:   Alert,  Well-developed, well-nourished, pleasant and cooperative in NAD HEENT: Normocephalic, atraumatic, extraocular movement intact. No scleral icterus. Lungs:  Clear throughout to auscultation.   No wheezes, crackles, or rhonchi. No acute distress. Heart:  Regular rate and rhythm; no murmurs, clicks, rubs,  or gallops. Abdomen: Soft, nontender, nondistended, bowel sounds present. No peritoneal signs. LE:  No edema. Pulses present Rectal:  External hemorrhoids noted. No rectal mass. Small amount of fresh blood noted on the finger  GI:  Lab Results:  Recent Labs  12/28/16 0408 12/29/16 0529 12/30/16 0544  WBC 13.2* 10.0 11.8*  HGB 9.0* 8.4* 8.9*  HCT 29.3* 27.5* 29.6*  PLT 404* 358 394   BMET  Recent Labs  12/28/16 0408 12/29/16 0529 12/30/16 0544  NA 139 138 137  K 2.9* 3.9 4.8  CL 104 105 107  CO2 24 25 20*  GLUCOSE 121* 132* 149*  BUN _0 CREATININE 0.80 0.92 0.73  CALCIUM 8.5* 8.2* 8.5*   LFT  Recent Labs  12/28/16 0408  PROT 7.0  ALBUMIN 2.6*  AST 23  ALT 16  ALKPHOS 54  BILITOT 0.3   PT/INR No results for input(s): LABPROT, INR in the last 72 hours.   Studies/Results: No results found.  Impression/Plan: - Painless hematochezia. Patient's vital stable. Hemoglobin actually better than yesterday. Rectal exam showed external hemorrhoids and small amount of fresh blood on the finger. Most likely hemorrhoidal bleed versus diverticular bleed versus AVM - Abnormal CT scan showing possible duodenal ulcer. Patient denied any abdominal pain. Normal BUN - Chronic anemia - Possible SIRS   Recommendations -------------------------- -  She is having probably rectal bleeding based on description and rectal exam showing streaks of fresh blood on the finger. Although patient does have a abnormal CT scan showing possible duodenal ulcer, she is hemodynamically stable and her hemoglobin is stable along with normal BUN. - Monitor H&H. Transfuse as needed. Keep patient on clear liquid diet for now.IV twice a day PPI - If she continues to have bleeding, will consider EGD and colonoscopy for further evaluation. - GI will follow.    LOS: 3 days   Otis Brace  MD, FACP 12/30/2016, 8:40 AM  Pager [phone] If no answer or after 5 PM call [phone]

## 2016-12-30 NOTE — Progress Notes (Signed)
Dr. Hal Hope notified that there will be less than 2 hours to infuse blood due to previously stated issue with lost IV site.  Dr. Hal Hope states to infuse blood over less than 2 hours. Blood restarted at 2237 at 120 ml/ hr and rate will be increased as patient tolerates.  Blood will be discontinued at [place] on 12/31/2016 to comply with 4 hour release time of blood.

## 2016-12-30 NOTE — Progress Notes (Signed)
IV site, #20 in right AC, began leaking.  Blood administration discontinued at this time and awaiting IV team to insert another IV.

## 2016-12-30 NOTE — Plan of Care (Signed)
The staff informed me that the patient had approximately 100 ML vaginal bleeding. Vital signs are stable. Lovenox was discontinued and SCDs ordered. Will get a CBC and BMP. May need ultrasound to visualize uterus and GYN later today.  Tennis Must, M.D.

## 2016-12-31 ENCOUNTER — Inpatient Hospital Stay (HOSPITAL_COMMUNITY): Payer: Medicare Other | Admitting: Certified Registered"

## 2016-12-31 ENCOUNTER — Inpatient Hospital Stay (HOSPITAL_COMMUNITY): Payer: Medicare Other

## 2016-12-31 ENCOUNTER — Encounter (HOSPITAL_COMMUNITY)
Admission: EM | Disposition: A | Discharge status: Home Health | Payer: Self-pay | Source: Home / Self Care | Attending: Family Medicine

## 2016-12-31 ENCOUNTER — Encounter (HOSPITAL_COMMUNITY): Payer: Self-pay | Admitting: Certified Registered"

## 2016-12-31 DIAGNOSIS — R7881 Bacteremia: Secondary | ICD-10-CM

## 2016-12-31 DIAGNOSIS — K922 Gastrointestinal hemorrhage, unspecified: Secondary | ICD-10-CM | POA: Diagnosis present

## 2016-12-31 DIAGNOSIS — R933 Abnormal findings on diagnostic imaging of other parts of digestive tract: Secondary | ICD-10-CM | POA: Diagnosis present

## 2016-12-31 HISTORY — PX: ESOPHAGOGASTRODUODENOSCOPY (EGD) WITH PROPOFOL: SHX5813

## 2016-12-31 LAB — TYPE AND SCREEN
ABO/RH(D): O POS
Antibody Screen: NEGATIVE
UNIT DIVISION: 0
UNIT DIVISION: 0

## 2016-12-31 LAB — CBC
HCT: 29 % — ABNORMAL LOW (ref 36.0–46.0)
Hemoglobin: 9.4 g/dL — ABNORMAL LOW (ref 12.0–15.0)
MCH: 28.9 pg (ref 26.0–34.0)
MCHC: 32.4 g/dL (ref 30.0–36.0)
MCV: 89.2 fL (ref 78.0–100.0)
PLATELETS: 299 10*3/uL (ref 150–400)
RBC: 3.25 MIL/uL — ABNORMAL LOW (ref 3.87–5.11)
RDW: 14.5 % (ref 11.5–15.5)
WBC: 14.5 10*3/uL — ABNORMAL HIGH (ref 4.0–10.5)

## 2016-12-31 LAB — GLUCOSE, CAPILLARY
Glucose-Capillary: 124 mg/dL — ABNORMAL HIGH (ref 65–99)
Glucose-Capillary: 129 mg/dL — ABNORMAL HIGH (ref 65–99)
Glucose-Capillary: 144 mg/dL — ABNORMAL HIGH (ref 65–99)
Glucose-Capillary: 108 mg/dL — ABNORMAL HIGH (ref 65–99)

## 2016-12-31 LAB — BPAM RBC
Blood Product Expiration Date: 201806042359
Blood Product Expiration Date: 201806042359
ISSUE DATE / TIME: 201805281625
ISSUE DATE / TIME: 201805282008
UNIT TYPE AND RH: 5100
Unit Type and Rh: 9500

## 2016-12-31 SURGERY — ESOPHAGOGASTRODUODENOSCOPY (EGD) WITH PROPOFOL
Anesthesia: Monitor Anesthesia Care

## 2016-12-31 MED ORDER — PROPOFOL 10 MG/ML IV BOLUS
INTRAVENOUS | Status: DC | PRN
  Administered 2016-12-31 (×3): 20 mg via INTRAVENOUS

## 2016-12-31 MED ORDER — PANTOPRAZOLE SODIUM 40 MG IV SOLR: 40.0000 mg | Freq: Two times a day (BID) | INTRAVENOUS | Status: DC

## 2016-12-31 MED ORDER — PROPOFOL 500 MG/50ML IV EMUL
INTRAVENOUS | Status: DC | PRN
  Administered 2016-12-31: 100 ug/kg/min via INTRAVENOUS

## 2016-12-31 MED ORDER — SODIUM CHLORIDE 0.9 % IV SOLN
8.0000 mg/h | INTRAVENOUS | Status: DC
  Administered 2016-12-31 – 2017-01-01 (×3): 8 mg/h via INTRAVENOUS
  Filled 2016-12-31 (×5): qty 80

## 2016-12-31 MED ORDER — PANTOPRAZOLE SODIUM 40 MG IV SOLR
80.0000 mg | Freq: Once | INTRAVENOUS | Status: AC
  Administered 2016-12-31: 15:00:00 80 mg via INTRAVENOUS
  Filled 2016-12-31: qty 80

## 2016-12-31 MED ORDER — SODIUM CHLORIDE 0.9 % IV SOLN
3.0000 g | Freq: Four times a day (QID) | INTRAVENOUS | Status: DC
  Administered 2016-12-31 – 2017-01-02 (×9): 3 g via INTRAVENOUS
  Filled 2016-12-31 (×11): qty 3

## 2016-12-31 SURGICAL SUPPLY — 14 items

## 2016-12-31 NOTE — Consult Note (Signed)
[facility] for Infectious Disease  Total days of antibiotics 5         Day 3 Keflex         Date of Admission: 12/27/2016 Consult Date: 12/31/2016       Reason for Consult: FUO    Referring Physician: Verlon Au  Active Problems:   Sepsis [facility])    HPI: [name] is a 83 y.o. female with fevers in the setting of known jaw osteomyelitis. She had teeth extractions in Feb/March 2018 and continued to have severe dental pain after the procedure. She was initially treated by her dentist (M. Johny Shears) with oral antibiotics; further evaluation lead to OM diagnosis and she was set up in [place] for hyperbaric oxygen treatments; she completed 4/40 but has a difficult time tolerating therapy d/t back pain. Right before her recent admission she was treated at [facility] for UTI with Keflex. Changes in mental status and worsening fevers prompted her family to bring her to ED here for evaluation where she was admitted 5/6 - 12/11/16 with continued osteomyelitis of the right mandible and sepsis. Maxillofacial CT at that time showed no fluid collection or abscess formation but severe right temporomandibular osteoarthrosis. At that time work up for back pain done - lumbar spine MRI showed no evidence of discitis or infection. BCx were negative. She was discharged home on Keflex.   Since 5/23 she has been feeling progressively worse regarding n/v, lack of appetite and fevers. She denies increased pain and swelling in her jaw. Presented to ED on 5/25 at the instruction of her PCP d/t fevers and leukocytosis. She was admitted with WBC of 13.2 and Temp 100.7. BCx were drawn and show NG 2/2 at day 3. She received 2 doses of vancomycin an pip-tazo. Started back on her Keflex 5/27. Temp last night 100.1 and WBC curve trending up again 10.0 > 11.8 > 14.5.   Past Medical History:  Diagnosis Date  . Anxiety   . Arthritis    osteo; "mostly hands, knees, probably back too" (12/09/2016)  . Chronic lower back  pain   . Complication of anesthesia 2011   resp distress -on vent after surgery  . Depression   . Esophageal stricture   . GERD (gastroesophageal reflux disease)   . Headache    out grew them  . High cholesterol   . History of blood transfusion 2011; ?03/2012   "related to ORs" (12/09/2016)  . Hypertension   . Intestinal obstruction ([place])   . Macular degeneration of left eye    dx over 55 yrs ago.....hasn't changed much  . Shingles   . Squamous carcinoma 2013   squamas cell on scalp--took 14 radiation tx--2013  . Stroke [facility]) 1967   Mini stroke;  No lasting deficits  . Type II diabetes mellitus ([place]) dx'd 11/2016    Allergies:  Allergies  Allergen Reactions  . Sulfa Antibiotics Swelling    Mouth and tongue swelling    Current antibiotics: Anti-infectives    Start     Dose/Rate Route Frequency Ordered Stop   12/29/16 1230  cephALEXin (KEFLEX) capsule 500 mg     500 mg Oral Every 12 hours 12/29/16 1221     12/28/16 1330  vancomycin (VANCOCIN) IVPB 1000 mg/200 mL premix  Status:  Discontinued     1,000 mg 200 mL/hr over 60 Minutes Intravenous Every 24 hours 12/27/16 1842 12/29/16 1221   12/27/16 2200  piperacillin-tazobactam (ZOSYN) IVPB 3.375 g  Status:  Discontinued  3.375 g 100 mL/hr over 30 Minutes Intravenous Every 8 hours 12/27/16 1842 12/27/16 1843   12/27/16 2200  piperacillin-tazobactam (ZOSYN) IVPB 3.375 g  Status:  Discontinued     3.375 g 12.5 mL/hr over 240 Minutes Intravenous Every 8 hours 12/27/16 1846 12/29/16 1221   12/27/16 1245  piperacillin-tazobactam (ZOSYN) IVPB 3.375 g     3.375 g 100 mL/hr over 30 Minutes Intravenous  Once 12/27/16 1238 12/27/16 1345   12/27/16 1245  vancomycin (VANCOCIN) IVPB 1000 mg/200 mL premix     1,000 mg 200 mL/hr over 60 Minutes Intravenous  Once 12/27/16 1238 12/27/16 1531       MEDICATIONS: . amLODipine  5 mg Oral Daily  . carvedilol  25 mg Oral BID WC  . cephALEXin  500 mg Oral Q12H  . famotidine  20 mg Oral  Daily  . gabapentin  300 mg Oral TID  . mirtazapine  30 mg Oral QHS  . pantoprazole (PROTONIX) IV  40 mg Intravenous Q12H  . potassium chloride  40 mEq Oral BID  . protein supplement shake  11 oz Oral BID BM    Social History  Substance Use Topics  . Smoking status: Former Smoker    Packs/day: 0.10    Years: 10.00    Types: Cigarettes    Quit date: 03/31/1991  . Smokeless tobacco: Never Used  . Alcohol use No    History reviewed. No pertinent family history.  Review of Systems - Negative except for what is described above in HPI  OBJECTIVE: Temp:  [98 F (36.7 C)-100.1 F (37.8 C)] 98.4 F (36.9 C) (05/29 0452) Pulse Rate:  [66-107] 72 (05/29 0025) Resp:  [14-24] 17 (05/29 0452) BP: (103-140)/(49-87) 140/68 (05/29 0950) SpO2:  [76 %-99 %] 95 % (05/29 0013)   General appearance: alert, cooperative, no distress and pleasant and well nourished.  Throat: lips, mucosa, and tongue normal; multiple teeth extractions noted, right mandible examined with no active abscess or drainage. Some visible swelling to right jaw .  Neck: no adenopathy, no carotid bruit, no JVD, supple, symmetrical, trachea midline and thyroid not enlarged, symmetric, no tenderness/mass/nodules Cardio: regular rate and rhythm and S1, S2 normal GI: soft, non-tender; bowel sounds normal; no masses,  no organomegaly Extremities: extremities normal, atraumatic, no cyanosis or edema Skin: Skin color, texture, turgor normal. No rashes or lesions Neurologic: Alert and oriented X 3, normal strength and tone. Normal symmetric reflexes. Normal coordination and gait  LABS: Results for orders placed or performed during the hospital encounter of 12/27/16 (from the past 48 hour(s))  Glucose, capillary     Status: Abnormal   Collection Time: 12/29/16 12:12 PM  Result Value Ref Range   Glucose-Capillary 133 (H) 65 - 99 mg/dL  Glucose, capillary     Status: None   Collection Time: 12/29/16  5:20 PM  Result Value Ref Range    Glucose-Capillary 96 65 - 99 mg/dL  Glucose, capillary     Status: Abnormal   Collection Time: 12/29/16  9:39 PM  Result Value Ref Range   Glucose-Capillary 137 (H) 65 - 99 mg/dL  CBC with Differential/Platelet     Status: Abnormal   Collection Time: 12/30/16  5:44 AM  Result Value Ref Range   WBC 11.8 (H) 4.0 - 10.5 K/uL   RBC 3.27 (L) 3.87 - 5.11 MIL/uL   Hemoglobin 8.9 (L) 12.0 - 15.0 g/dL   HCT 29.6 (L) 36.0 - 46.0 %   MCV 90.5 78.0 - 100.0 fL  MCH 27.2 26.0 - 34.0 pg   MCHC 30.1 30.0 - 36.0 g/dL   RDW 13.7 11.5 - 15.5 %   Platelets 394 150 - 400 K/uL   Neutrophils Relative % 68 %   Neutro Abs 8.0 (H) 1.7 - 7.7 K/uL   Lymphocytes Relative 21 %   Lymphs Abs 2.5 0.7 - 4.0 K/uL   Monocytes Relative 6 %   Monocytes Absolute 0.7 0.1 - 1.0 K/uL   Eosinophils Relative 5 %   Eosinophils Absolute 0.6 0.0 - 0.7 K/uL   Basophils Relative 0 %   Basophils Absolute 0.0 0.0 - 0.1 K/uL  Basic metabolic panel     Status: Abnormal   Collection Time: 12/30/16  5:44 AM  Result Value Ref Range   Sodium 137 135 - 145 mmol/L   Potassium 4.8 3.5 - 5.1 mmol/L   Chloride 107 101 - 111 mmol/L   CO2 20 (L) 22 - 32 mmol/L   Glucose, Bld 149 (H) 65 - 99 mg/dL   BUN 14 6 - 20 mg/dL   Creatinine, Ser 0.73 0.44 - 1.00 mg/dL   Calcium 8.5 (L) 8.9 - 10.3 mg/dL   GFR calc non Af Amer >60 >60 mL/min   GFR calc Af Amer >60 >60 mL/min    Comment: (NOTE) The eGFR has been calculated using the CKD EPI equation. This calculation has not been validated in all clinical situations. eGFR's persistently <60 mL/min signify possible Chronic Kidney Disease.    Anion gap 10 5 - 15  Glucose, capillary     Status: Abnormal   Collection Time: 12/30/16  8:00 AM  Result Value Ref Range   Glucose-Capillary 161 (H) 65 - 99 mg/dL  Type and screen [place]     Status: None   Collection Time: 12/30/16  8:15 AM  Result Value Ref Range   ABO/RH(D) O POS    Antibody Screen NEG    Sample  Expiration 01/02/2017    Unit Number [id number]    Blood Component Type RCLI PHER 1    Unit division 00    Status of Unit ISSUED,FINAL    Transfusion Status OK TO TRANSFUSE    Crossmatch Result Compatible    Unit Number [id number]    Blood Component Type RBC, LR IRR    Unit division 00    Status of Unit ISSUED,FINAL    Transfusion Status OK TO TRANSFUSE    Crossmatch Result Compatible   Hemoglobin and Hematocrit     Status: Abnormal   Collection Time: 12/30/16  8:15 AM  Result Value Ref Range   Hemoglobin 8.5 (L) 12.0 - 15.0 g/dL   HCT 28.2 (L) 36.0 - 46.0 %  Prepare RBC     Status: None   Collection Time: 12/30/16  8:15 AM  Result Value Ref Range   Order Confirmation ORDER PROCESSED BY BLOOD BANK   Glucose, capillary     Status: Abnormal   Collection Time: 12/30/16 12:23 PM  Result Value Ref Range   Glucose-Capillary 234 (H) 65 - 99 mg/dL  Hemoglobin and hematocrit, blood     Status: Abnormal   Collection Time: 12/30/16  3:19 PM  Result Value Ref Range   Hemoglobin 6.8 (LL) 12.0 - 15.0 g/dL    Comment: REPEATED TO VERIFY CRITICAL RESULT CALLED TO, READ BACK BY AND VERIFIED WITH: N.CHAMBERLAN,RN 12/30/16 @ [address]    HCT 22.2 (L) 36.0 - 46.0 %  Glucose, capillary     Status: Abnormal  Collection Time: 12/30/16  5:12 PM  Result Value Ref Range   Glucose-Capillary 162 (H) 65 - 99 mg/dL  Glucose, capillary     Status: Abnormal   Collection Time: 12/30/16  9:26 PM  Result Value Ref Range   Glucose-Capillary 124 (H) 65 - 99 mg/dL  CBC     Status: Abnormal   Collection Time: 12/31/16  1:46 AM  Result Value Ref Range   WBC 14.5 (H) 4.0 - 10.5 K/uL   RBC 3.25 (L) 3.87 - 5.11 MIL/uL   Hemoglobin 9.4 (L) 12.0 - 15.0 g/dL    Comment: POST TRANSFUSION SPECIMEN   HCT 29.0 (L) 36.0 - 46.0 %   MCV 89.2 78.0 - 100.0 fL   MCH 28.9 26.0 - 34.0 pg   MCHC 32.4 30.0 - 36.0 g/dL   RDW 14.5 11.5 - 15.5 %   Platelets 299 150 - 400 K/uL  Glucose, capillary     Status:  Abnormal   Collection Time: 12/31/16  8:32 AM  Result Value Ref Range   Glucose-Capillary 129 (H) 65 - 99 mg/dL    MICRO: Results for orders placed or performed during the hospital encounter of 12/27/16  Blood Culture (routine x 2)     Status: None (Preliminary result)   Collection Time: 12/27/16 12:08 PM  Result Value Ref Range Status   Specimen Description BLOOD RIGHT ANTECUBITAL  Final   Special Requests   Final    BOTTLES DRAWN AEROBIC AND ANAEROBIC Blood Culture adequate volume   Culture NO GROWTH 3 DAYS  Final   Report Status PENDING  Incomplete  Blood Culture (routine x 2)     Status: None (Preliminary result)   Collection Time: 12/27/16  1:16 PM  Result Value Ref Range Status   Specimen Description BLOOD LEFT ANTECUBITAL  Final   Special Requests   Final    BOTTLES DRAWN AEROBIC AND ANAEROBIC Blood Culture adequate volume   Culture NO GROWTH 3 DAYS  Final   Report Status PENDING  Incomplete     IMAGING: No results found.  HISTORICAL MICRO/IMAGING  Assessment/Plan:  83 yo female with persistent osteomyelitis of her right mandible with recurrent fevers and leukocytosis. She is not able to tolerate the hyperbaric therapy well. Currently maintained only on Keflex.   OM of Right Mandible  -Worsening leukocytosis, Sed rate extremely high 72 (5/6) >> >140 (5/27).  -D/C Keflex -Start Unasyn IV 3g q6h for coverage against oral anaerobes and strep species.  -Treatment course for osteomyelitis will be prolonged for several weeks - hopefully 4 weeks with transition to PO to finish out treatment. She plans on returning to hyperbaric treatments once she D/C's home.  -BCx negative on admission. Please place PICC -TTE in progress to review as well. Would not risk TEE at this point since she has had negative BCx on admission and exam that is c/w under treated OM of jaw.    -OPAT consult placed; orders as below. Can simplify regimen possibly if Q6h dosing difficult for Unasyn.    Anemia -going for EGD today as she has been found to have rectal bleeding this admission.  -Last colonoscopy was in 2002 which was unremarkable according to patient. No family history of colon cancer.  -Likely partially contributing to overall feeling of fatigue/malaise.    OPAT Plan:  Diagnosis: Osteomyelitis of Right Mandible, Recurrent  Culture Result: none  Allergies  Allergen Reactions  . Sulfa Antibiotics Swelling    Mouth and tongue swelling    Discharge  antibiotics: Ampicillin - Sulbactam 3 gm IV q6h   Duration: 4 weeks  End Date: June 27th 2108  [place] Per Protocol:  Labs weekly while on IV antibiotics: _x_ CBC with differential __ BMP _x_ CMP _x_ CRP _x_ ESR __ Vancomycin trough  __ Please pull PIC at completion of IV antibiotics _x_ Please leave PIC in place until doctor has seen patient or been notified  Fax weekly labs to [phone]  Clinic Follow Up Appt: Baxter Flattery   @ [facility] in 3-4 weeks.   Thank you for allowing [place] to meet Bevely Palmer and participate in her care.   Janene Madeira, MSN, NP-C [facility] for Infectious [place] Cell: [phone] Pager: [phone]  12/31/2016 11:20 AM

## 2016-12-31 NOTE — Brief Op Note (Signed)
Large duodenal bulb ulcers with largest one having a small amount of pigmented material. Distal esophageal stricture noted. Ulcerative esophagitis noted. See endopro note for complete details/recs. Clear bilious fluid seen during EGD. No active bleeding or blood products seen.

## 2016-12-31 NOTE — Progress Notes (Signed)
  Echocardiogram 2D Echocardiogram has been performed.  [name] 12/31/2016, 4:56 PM

## 2016-12-31 NOTE — Transfer of Care (Signed)
Immediate Anesthesia Transfer of Care Note  Patient: [name]  Procedure(s) Performed: Procedure(s): ESOPHAGOGASTRODUODENOSCOPY (EGD) WITH PROPOFOL (N/A)  Patient Location: PACU  Anesthesia Type:MAC  Level of Consciousness: awake and oriented  Airway & Oxygen Therapy: Patient Spontanous Breathing and Patient connected to nasal cannula oxygen  Post-op Assessment: Report given to RN, Post -op Vital signs reviewed and stable and Patient moving all extremities  Post vital signs: Reviewed and stable  Last Vitals:  Vitals:   12/31/16 0950 12/31/16 1147  BP: 140/68 (!) 149/60  Pulse:  73  Resp:  18  Temp:  36.8 C    Last Pain:  Vitals:   12/31/16 1147  TempSrc: Oral  PainSc:       Patients Stated Pain Goal: 3 (72/18/28 8337)  Complications: No apparent anesthesia complications

## 2016-12-31 NOTE — Anesthesia Preprocedure Evaluation (Signed)
Anesthesia Evaluation  Patient identified by MRN, date of birth, ID band Patient awake    Reviewed: Allergy & Precautions, NPO status , Patient's Chart, lab work & pertinent test results  Airway Mallampati: II  TM Distance: >3 FB Neck ROM: Full    Dental   Pulmonary former smoker,    Pulmonary exam normal        Cardiovascular hypertension, Pt. on medications Normal cardiovascular exam     Neuro/Psych Anxiety Depression CVA    GI/Hepatic GERD  Medicated and Controlled,  Endo/Other  diabetes, Type 2, Oral Hypoglycemic Agents, Insulin Dependent  Renal/GU      Musculoskeletal   Abdominal   Peds  Hematology   Anesthesia Other Findings   Reproductive/Obstetrics                             Anesthesia Physical Anesthesia Plan  ASA: III  Anesthesia Plan: MAC   Post-op Pain Management:    Induction: Intravenous  Airway Management Planned: Simple Face Mask  Additional Equipment:   Intra-op Plan:   Post-operative Plan:   Informed Consent: I have reviewed the patients History and Physical, chart, labs and discussed the procedure including the risks, benefits and alternatives for the proposed anesthesia with the patient or authorized representative who has indicated his/her understanding and acceptance.     Plan Discussed with: CRNA and Surgeon  Anesthesia Plan Comments:         Anesthesia Quick Evaluation

## 2016-12-31 NOTE — Op Note (Signed)
[facility] Patient Name: [name] Procedure Date : 12/31/2016 MRN: [id number] Attending MD: Lear Ng , MD Date of Birth: 1931/08/15 CSN: [id number] Age: 83 Admit Type: Inpatient Procedure:                Upper GI endoscopy Indications:              Recent gastrointestinal bleeding, Hematochezia,                            Abnormal CT of the GI tract Providers:                Lear Ng, MD, Dortha Schwalbe RN, RN,                            Cletis Athens, Technician, Claybon Jabs CRNA, CRNA Referring MD:              Medicines:                Propofol per Anesthesia, Monitored Anesthesia Care Complications:            No immediate complications. Estimated Blood Loss:     Estimated blood loss: none. Procedure:                Pre-Anesthesia Assessment:                           - Prior to the procedure, a History and Physical                            was performed, and patient medications and                            allergies were reviewed. The patient's tolerance of                            previous anesthesia was also reviewed. The risks                            and benefits of the procedure and the sedation                            options and risks were discussed with the patient.                            All questions were answered, and informed consent                            was obtained. Prior Anticoagulants: The patient has                            taken no previous anticoagulant or antiplatelet                            agents. ASA Grade Assessment: III - A patient with  severe systemic disease. After reviewing the risks                            and benefits, the patient was deemed in                            satisfactory condition to undergo the procedure.                           After obtaining informed consent, the endoscope was                            passed under direct vision.  Throughout the                            procedure, the patient's blood pressure, pulse, and                            oxygen saturations were monitored continuously. The                            EG-[id number] ([id number]) scope was introduced through the                            mouth, and advanced to the second part of duodenum.                            The upper GI endoscopy was accomplished without                            difficulty. The patient tolerated the procedure                            well. Scope In: Scope Out: Findings:      One moderate (circumferential scarring or stenosis; an endoscope may       pass) benign-appearing, intrinsic stenosis was found 30 to 35 cm from       the incisors. This measured 5 cm (in length) and was traversed.      LA Grade C (one or more mucosal breaks continuous between tops of 2 or       more mucosal folds, less than 75% circumference) esophagitis with no       bleeding was found in the distal esophagus.      A medium-sized hiatal hernia was present.      The exam of the stomach was otherwise normal.      Three non-bleeding cratered duodenal ulcers with pigmented material were       found in the duodenal bulb. The largest lesion was 15 mm in largest       dimension.      The second portion of the duodenum was normal. Impression:               - Benign-appearing esophageal stenosis.                           - LA Grade C  reflux esophagitis.                           - Medium-sized hiatal hernia.                           - Multiple non-bleeding duodenal ulcers with                            pigmented material.                           - Normal second portion of the duodenum.                           - No specimens collected. Moderate Sedation:      N/A - MAC procedure Recommendation:           - Clear liquid diet.                           - Give Protonix (pantoprazole): initiate therapy                            with 80 mg IV  bolus, then 8 mg/hr IV by continuous                            infusion.                           - Avoid NSAIDs.                           - Perform an H. pylori serology.                           - Post procedure medication orders were given. Procedure Code(s):        --- Professional ---                           [phone], Esophagogastroduodenoscopy, flexible,                            transoral; diagnostic, including collection of                            specimen(s) by brushing or washing, when performed                            (separate procedure) Diagnosis Code(s):        --- Professional ---                           K92.2, Gastrointestinal hemorrhage, unspecified                           K26.9, Duodenal ulcer, unspecified as acute or  chronic, without hemorrhage or perforation                           K22.2, Esophageal obstruction                           K21.0, Gastro-esophageal reflux disease with                            esophagitis                           K92.1, Melena (includes Hematochezia)                           K44.9, Diaphragmatic hernia without obstruction or                            gangrene                           R93.3, Abnormal findings on diagnostic imaging of                            other parts of digestive tract CPT copyright 2016 [employer]. All rights reserved. The codes documented in this report are preliminary and upon coder review may  be revised to meet current compliance requirements. Lear Ng, MD 12/31/2016 12:51:55 PM This report has been signed electronically. Number of Addenda: 0

## 2016-12-31 NOTE — Progress Notes (Signed)
PHARMACY CONSULT NOTE FOR:  OUTPATIENT  PARENTERAL ANTIBIOTIC THERAPY (OPAT)  Indication: jaw osteomyelitis Regimen: Unasyn 3gm IV q6h End date: January 28, 2017 (4 weeks from start date of 01/01/17)  IV antibiotic discharge orders are pended. To discharging provider:  please sign these orders via discharge navigator,  Select New Orders & click on the button choice - Manage This Unsigned Work.     Thank you for allowing pharmacy to be a part of this patient's care.  [name], Rocky Crafts 12/31/2016, 5:17 PM

## 2016-12-31 NOTE — Anesthesia Postprocedure Evaluation (Signed)
Anesthesia Post Note  Patient: [name]  Procedure(s) Performed: Procedure(s) (LRB): ESOPHAGOGASTRODUODENOSCOPY (EGD) WITH PROPOFOL (N/A)  Patient location during evaluation: PACU Anesthesia Type: MAC Level of consciousness: awake and alert Pain management: pain level controlled Vital Signs Assessment: post-procedure vital signs reviewed and stable Respiratory status: spontaneous breathing, nonlabored ventilation, respiratory function stable and patient connected to nasal cannula oxygen Cardiovascular status: stable and blood pressure returned to baseline Anesthetic complications: no       Last Vitals:  Vitals:   12/31/16 1300 12/31/16 1306  BP: (!) 87/71 (!) 122/56  Pulse:  70  Resp: 19   Temp:      Last Pain:  Vitals:   12/31/16 1347  TempSrc:   PainSc: 5                  [name] DAVID

## 2016-12-31 NOTE — Interval H&P Note (Signed)
History and Physical Interval Note:  12/31/2016 12:21 PM  [name]  has presented today for surgery, with the diagnosis of GI bleed  The various methods of treatment have been discussed with the patient and family. After consideration of risks, benefits and other options for treatment, the patient has consented to  Procedure(s): ESOPHAGOGASTRODUODENOSCOPY (EGD) WITH PROPOFOL (N/A) as a surgical intervention .  The patient's history has been reviewed, patient examined, no change in status, stable for surgery.  I have reviewed the patient's chart and labs.  Questions were answered to the patient's satisfaction.     [place] [facility].

## 2016-12-31 NOTE — Progress Notes (Signed)
Pharmacy Antibiotic Note  [name] is a 83 y.o. female admitted on 12/27/2016 with fever and elevated WBC.  Originally started on Vancomycin and Zosyn for empiric coverage then narrowed to Keflex.  Pt now acutely worse with elevated WBC.  ID consulted has recommended prolonged IV antibiotic therapy for known jaw osteomyelitis.  Pharmacy has been consulted for Unasyn dosing.  SCr 0.73, CrCl ~ 60 ml/min  Plan: Unasyn 3gm IV q6h PICC placement Follow-up TTE OPAT consult completed (4 weeks with 5/30 as start date)  Height: 5' 2" (157.5 cm) Weight: 137 lb 6.4 oz (62.3 kg) IBW/kg (Calculated) : 50.1  Temp (24hrs), Avg:98.8 F (37.1 C), Min:98 F (36.7 C), Max:100.1 F (37.8 C)   Recent Labs Lab 12/27/16 1200 12/27/16 1219 12/27/16 1504 12/27/16 1541 12/28/16 0408 12/29/16 0529 12/30/16 0544 12/31/16 0146  WBC 21.6*  --   --   --  13.2* 10.0 11.8* 14.5*  CREATININE 0.81  --   --   --  0.80 0.92 0.73  --   LATICACIDVEN  --  1.70 1.23 1.28  --   --   --   --     Estimated Creatinine Clearance: 46.3 mL/min (by C-G formula based on SCr of 0.73 mg/dL).    Allergies  Allergen Reactions  . Sulfa Antibiotics Swelling    Mouth and tongue swelling    Antimicrobials this admission: Keflex 5/4 >> 5/14? (outpt Rx for 10d) Vanc 5/25 >> 5/26 Zosyn 5/25 >> 5/26 Keflex 5/27 >> 5/29 Unasyn 5/29 >>  Dose adjustments this admission: None   Microbiology results: 5/25 blood x 2 ngtd  Thank you for allowing pharmacy to be a part of this patient's care.  [employer], Pharm.D., BCPS Clinical Pharmacist Pager: [phone] 12/31/2016 5:16 PM

## 2016-12-31 NOTE — Progress Notes (Signed)
PROGRESS NOTE    [name]  FXT:[id number] DOB: Jul 23, 1932 DOA: 12/27/2016 PCP: [name], PA-C  Outpatient Specialists:     Brief Narrative:   83 y.o. female  Dm ty II Htn CVA CKD III HLD s/p discectomy 3.2016, 08/2014 as wel Dental pain s/p multiple recent extractions [Dr. Lenda Kelp [place]] and Jaw osteomyelitis htn Prior urina retantion L femoral # Prior remote h/o CVA at birth of daughter in [place] colonoscopy 2002  Recent admit 5/6-12/11/16 Jaw osteo-sent home with Keflex-patient is set to receive 4 more doses to complete a treatment for this and is on 4/40 treatments of hyperbaric oxygen to help heal this area. Patient relates in the presence of her daughter that she has been feeling "terrible" since 12/25/2016  deveoped brb per rectum 5/28 and gi consulted for eval  Assessment & Plan:   Active Problems:   Sepsis ([place])   Possible GI bleed Sounds like brisk bleeding either hemorrhoidal vs diverticulosis given descrition brb with cranberry sauce clot consistentcy distal to ligament  ftrietz Holding cataflam for pain  Appreciate GI input Type and screen and check hemoglobin q 12 -hold trasnfusion unless hemoglobin below 7 ?scope this am  Sepsis Source is unclear but potential he secondary to osteomyelitis So far urine culture is negative, blood culture also negative Both ESR and CRP are elevated Ordered but in view of above Hold TEE for now--Asking Id to comment on clinical scenario  Transition off of vancomycin and Zosyn-->Keflex 500 twice a day White count 21-13--10-->11.8-->14 and patient clinically improved  Sinus tachycardia Reactive secondary to SIRS She has not really been taking her Coreg so would place her on home dose 25 twice a day  Sinus tachycardia is resolved  Recent osteomyelitis of the jaw Completing apparently hyperbaric treatments in [place] Can continue Norco one every 4 when necessary as per home medications  Chronic  low back pain status post surgeries 08/2014,  01/2015 Continue Norco as above, Cataflam 50 twice a day [on hold], baclofen 10 twice a day when necessary spasm, Flexeril 10 3 times a day when necessary Cont Gabapentin 300 tid Stable currently May need to cut back opiates or place on good laxative course  Hypertension Continue amlodipine 5 daily Blood pressure is controlled  Reflux Continue Pepcid 20 daily  Appetite Continue Remeron 30 at bedtime  Diabetes mellitus type 2 Holding metformin 500 XL daily for now place on sliding scale Sugars 130--147  Prior CVa 2/2 to pre-eclampsia  Lovenox-->SCD 5/28 Needs Endoscopy/colonoscopy Not ready for d/c Get echocardiogram rule out endocarditis and will discuss with ID 1 cultures finalize regarding potential duration of Keflex  Consultants:   None yet  Procedures:   CT maxillofacial  CT abdomen  Echocardiogram is pending  Antimicrobials:   Vancomycin 5/26-5/27  Zosyn 5/26 -5/27  Keflex 5/27   Subjective:  No further stool No n/v No bleeding Rough night with IV access and no sleep No cp No fever   Objective: Vitals:   12/31/16 0013 12/31/16 0025 12/31/16 0452 12/31/16 0950  BP: (!) 135/54  (!) 121/49 140/68  Pulse: (!) 107 72    Resp: (!) _0 Temp: 98.6 F (37 C)  98.4 F (36.9 C)   TempSrc: Oral     SpO2: 95%     Weight:      Height:        Intake/Output Summary (Last 24 hours) at 12/31/16 1014 Last data filed at 12/31/16 0515  Gross per 24 hour  Intake           602.67 ml  Output                0 ml  Net           602.67 ml   Filed Weights   12/27/16 1846  Weight: 62.3 kg (137 lb 6.4 oz)    Examination:  EOMI NCAT no pallor Dentition poor lower jaw Neck is soft and supple Chest clinically clear no added sound Abdomen is soft nontender No edema No rash   Data Reviewed: I have personally reviewed following labs and imaging studies  CBC:  Recent Labs Lab 12/27/16 1200  12/28/16 0408 12/29/16 0529 12/30/16 0544 12/30/16 0815 12/30/16 1519 12/31/16 0146  WBC 21.6* 13.2* 10.0 11.8*  --   --  14.5*  NEUTROABS 17.6*  --   --  8.0*  --   --   --   HGB 10.7* 9.0* 8.4* 8.9* 8.5* 6.8* 9.4*  HCT 33.7* 29.3* 27.5* 29.6* 28.2* 22.2* 29.0*  MCV 88.7 89.3 89.3 90.5  --   --  89.2  PLT 545* 404* 358 394  --   --  453   Basic Metabolic Panel:  Recent Labs Lab 12/27/16 1200 12/28/16 0408 12/29/16 0529 12/30/16 0544  NA 138 139 138 137  K 3.5 2.9* 3.9 4.8  CL 100* 104 105 107  CO2 _0 20*  GLUCOSE 217* 121* 132* 149*  BUN _1 CREATININE 0.81 0.80 0.92 0.73  CALCIUM 9.9 8.5* 8.2* 8.5*   GFR: Estimated Creatinine Clearance: 46.3 mL/min (by C-G formula based on SCr of 0.73 mg/dL). Liver Function Tests:  Recent Labs Lab 12/27/16 1200 12/28/16 0408  AST 27 23  ALT 17 16  ALKPHOS 69 54  BILITOT 0.3 0.3  PROT 8.9* 7.0  ALBUMIN 3.2* 2.6*   No results for input(s): LIPASE, AMYLASE in the last 168 hours. No results for input(s): AMMONIA in the last 168 hours. Coagulation Profile: No results for input(s): INR, PROTIME in the last 168 hours. Cardiac Enzymes: No results for input(s): CKTOTAL, CKMB, CKMBINDEX, TROPONINI in the last 168 hours. BNP (last 3 results) No results for input(s): PROBNP in the last 8760 hours. HbA1C: No results for input(s): HGBA1C in the last 72 hours. CBG:  Recent Labs Lab 12/29/16 2139 12/30/16 0800 12/30/16 1223 12/30/16 1712 12/31/16 0832  GLUCAP 137* 161* 234* 162* 129*   Lipid Profile: No results for input(s): CHOL, HDL, LDLCALC, TRIG, CHOLHDL, LDLDIRECT in the last 72 hours. Thyroid Function Tests: No results for input(s): TSH, T4TOTAL, FREET4, T3FREE, THYROIDAB in the last 72 hours. Anemia Panel: No results for input(s): VITAMINB12, FOLATE, FERRITIN, TIBC, IRON, RETICCTPCT in the last 72 hours. Urine analysis:    Component Value Date/Time   COLORURINE YELLOW 12/27/2016 1636   APPEARANCEUR  CLEAR 12/27/2016 1636   LABSPEC 1.045 (H) 12/27/2016 1636   PHURINE 7.0 12/27/2016 1636   GLUCOSEU NEGATIVE 12/27/2016 1636   HGBUR NEGATIVE 12/27/2016 1636   BILIRUBINUR NEGATIVE 12/27/2016 1636   KETONESUR NEGATIVE 12/27/2016 1636   PROTEINUR NEGATIVE 12/27/2016 1636   UROBILINOGEN 1.0 04/04/2012 1310   NITRITE NEGATIVE 12/27/2016 1636   LEUKOCYTESUR NEGATIVE 12/27/2016 1636   Sepsis Labs: _2 (procalcitonin:4,lacticidven:4)  ) Recent Results (from the past 240 hour(s))  Blood Culture (routine x 2)     Status: None (Preliminary result)   Collection Time: 12/27/16 12:08 PM  Result Value Ref Range Status   Specimen Description BLOOD  RIGHT ANTECUBITAL  Final   Special Requests   Final    BOTTLES DRAWN AEROBIC AND ANAEROBIC Blood Culture adequate volume   Culture NO GROWTH 3 DAYS  Final   Report Status PENDING  Incomplete  Blood Culture (routine x 2)     Status: None (Preliminary result)   Collection Time: 12/27/16  1:16 PM  Result Value Ref Range Status   Specimen Description BLOOD LEFT ANTECUBITAL  Final   Special Requests   Final    BOTTLES DRAWN AEROBIC AND ANAEROBIC Blood Culture adequate volume   Culture NO GROWTH 3 DAYS  Final   Report Status PENDING  Incomplete      Radiology Studies: No results found.   Scheduled Meds: . amLODipine  5 mg Oral Daily  . carvedilol  25 mg Oral BID WC  . cephALEXin  500 mg Oral Q12H  . famotidine  20 mg Oral Daily  . gabapentin  300 mg Oral TID  . mirtazapine  30 mg Oral QHS  . pantoprazole (PROTONIX) IV  40 mg Intravenous Q12H  . potassium chloride  40 mEq Oral BID  . protein supplement shake  11 oz Oral BID BM   Continuous Infusions: . sodium chloride 20 mL/hr at 12/31/16 0258     LOS: 4 days    Time spent: [place], MD [facility] ([facility]   If 7PM-7AM, please contact night-coverage [email or web address] Password [email or web address] 12/31/2016, 10:14 AM

## 2016-12-31 NOTE — Care Management Important Message (Signed)
Important Message  Patient Details  Name: [name] MRN: [id number] Date of Birth: 1932/06/24   Medicare Important Message Given:  Yes    Bera Pinela Abena 12/31/2016, 1:21 PM

## 2016-12-31 NOTE — H&P (View-Only) (Signed)
Referring Provider:  Dr. Samtani Primary Care Physician:  Conroy, Nathan, PA-C Primary Gastroenterologist:  Unassigned  Reason for Consultation:  GI bleed  HPI: [name] is a 83 y.o. 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 Patient was initially admitted earlier this month for possible osteomyelitis of right mandible along with possible sepsis. Patient was discharged after 2 days of admission. She came into the ER again on May 25 with complaint of weakness. Patient was found to rectal bleeding this morning. GI is consulted for further evaluation.  Patient seen and examined at bedside. According to patient she had a bowel movement this morning and nursing staff noted maroon-colored blood along with blood clots. Patient denied any abdominal pain or abdominal cramps prior to bowel movements. Denied any vomiting. Complaining of some nausea.  Last colonoscopy was in 2002 which was unremarkable according to patient. No family history of colon cancer.    Past Medical History:  Diagnosis Date  . Anxiety   . Arthritis    osteo; "mostly hands, knees, probably back too" (12/09/2016)  . Chronic lower back pain   . Complication of anesthesia 2011   resp distress -on vent after surgery  . Depression   . Esophageal stricture   . GERD (gastroesophageal reflux disease)   . Headache    out grew them  . High cholesterol   . History of blood transfusion 2011; ?03/2012   "related to ORs" (12/09/2016)  . Hypertension   . Intestinal obstruction (HCC)   . Macular degeneration of left eye    dx over 55 yrs ago.....hasn't changed much  . Shingles   . Squamous carcinoma 2013   squamas cell on scalp--took 14 radiation tx--2013  . Stroke (HCC) 1967   Mini stroke;  No lasting deficits  . Type II diabetes mellitus (HCC) dx'd 11/2016    Past Surgical History:   Procedure Laterality Date  . ABDOMINAL HYSTERECTOMY    . APPENDECTOMY    . BACK SURGERY    . CATARACT EXTRACTION, BILATERAL Bilateral   . CHOLECYSTECTOMY    . COLON SURGERY    . COLONOSCOPY    . ESOPHAGOGASTRODUODENOSCOPY (EGD) WITH ESOPHAGEAL DILATION  "several times"  . EYE SURGERY    . FRACTURE SURGERY    . JOINT REPLACEMENT    . LUMBAR DISC SURGERY  10/25/2014   Right L4-L5 removal of seven free fragments of disk mostly posterolaterally.  Lysis of adhesion.  Microscope/notes 10/26/2014  . LUMBAR LAMINECTOMY/DECOMPRESSION MICRODISCECTOMY Right 08/09/2014   Procedure: Right Lumbar Four to Five Microdiskectomy;  Surgeon: Ernesto M Botero, MD;  Location: [facility];  Service: Neurosurgery;  Laterality: Right;  Right L4-5 Microdiskectomy  . ORIF PERIPROSTHETIC FRACTURE  03/31/2012   Procedure: OPEN REDUCTION INTERNAL FIXATION (ORIF) PERIPROSTHETIC FRACTURE;  Surgeon: Matthew D Olin, MD;  Location: [facility];  Service: Orthopedics;  Laterality: Left;  Open reduction internal fixation Left distal femur periprosthetic fracture  . SMALL INTESTINE SURGERY  2011   "really bad blockage; went into respiratory distress and was on ventilator after surgery; [facility]"  . SQUAMOUS CELL CARCINOMA EXCISION  ~ 2012   S/P "cut off her head then 15 radiation txs"   . TOTAL KNEE ARTHROPLASTY Bilateral    bilateral    Prior to Admission medications   Medication Sig Start Date End Date Taking? Authorizing Provider  amLODipine (NORVASC) 5 MG   tablet Take 5 mg by mouth daily.   Yes [provider]  atorvastatin (LIPITOR) 40 MG tablet Take 40 mg by mouth daily.   Yes [provider]  baclofen (LIORESAL) 10 MG tablet Take 10 mg by mouth 2 (two) times daily as needed for muscle spasms.   Yes [provider]  carvedilol (COREG) 25 MG tablet Take 25 mg by mouth 2 (two) times daily with a meal.   Yes [provider]  cyclobenzaprine (FLEXERIL) 10 MG tablet Take 1 tablet (10  mg total) by mouth 3 (three) times daily as needed for muscle spasms. 10/21/14  Yes Leonard Schwartz, MD  diclofenac (CATAFLAM) 50 MG tablet Take 50 mg by mouth 2 (two) times daily.   Yes [provider]  gabapentin (NEURONTIN) 300 MG capsule Take 300 mg by mouth 3 (three) times daily.   Yes [provider]  HYDROcodone-acetaminophen (NORCO/VICODIN) 5-325 MG tablet Take 1 tablet by mouth every 4 (four) hours as needed for moderate pain.   Yes [provider]  losartan-hydrochlorothiazide (HYZAAR) 100-25 MG per tablet Take 1 tablet by mouth daily.   Yes [provider]  mirtazapine (REMERON) 30 MG tablet Take 30 mg by mouth at bedtime.   Yes [provider]  ranitidine (ZANTAC) 150 MG tablet Take 150 mg by mouth daily.   Yes [provider]  metFORMIN (GLUCOPHAGE-XR) 500 MG 24 hr tablet Take 500 mg by mouth daily with breakfast.    [provider]  traMADol-acetaminophen (ULTRACET) 37.5-325 MG per tablet Take 2 tablets by mouth 4 (four) times daily as needed (pain).    [provider]    Scheduled Meds: . acetaminophen  650 mg Oral Once  . amLODipine  5 mg Oral Daily  . carvedilol  25 mg Oral BID WC  . cephALEXin  500 mg Oral Q12H  . diclofenac  50 mg Oral BID WC  . diphenhydrAMINE  25 mg Oral Once  . famotidine  20 mg Oral Daily  . furosemide  20 mg Intravenous Once  . gabapentin  300 mg Oral TID  . mirtazapine  30 mg Oral QHS  . potassium chloride  40 mEq Oral BID  . protein supplement shake  11 oz Oral BID BM   Continuous Infusions: . sodium chloride     PRN Meds:.alum & mag hydroxide-simeth, baclofen, cyclobenzaprine, HYDROcodone-acetaminophen  Allergies as of 12/27/2016 - Review Complete 12/27/2016  Allergen Reaction Noted  . Sulfa antibiotics Swelling 03/30/2012    History reviewed. No pertinent family history.  Social History   Social History  . Marital status: Widowed    Spouse name: N/A  . Number of  children: N/A  . Years of education: N/A   Occupational History  . Not on file.   Social History Main Topics  . Smoking status: Former Smoker    Packs/day: 0.10    Years: 10.00    Types: Cigarettes    Quit date: 03/31/1991  . Smokeless tobacco: Never Used  . Alcohol use No  . Drug use: No  . Sexual activity: No   Other Topics Concern  . Not on file   Social History Narrative  . No narrative on file    Review of Systems: Review of Systems  Constitutional: Positive for malaise/fatigue. Negative for fever.  HENT: Negative for ear discharge, ear pain, hearing loss and tinnitus.   Eyes: Negative for blurred vision, double vision and photophobia.  Respiratory: Negative for cough, hemoptysis and sputum production.  Cardiovascular: Positive for chest pain. Negative for palpitations and orthopnea.  Gastrointestinal: Positive for blood in stool, heartburn and nausea. Negative for abdominal pain, diarrhea and vomiting.  Genitourinary: Negative for dysuria and urgency.  Musculoskeletal: Positive for myalgias. Negative for joint pain and neck pain.  Skin: Negative for rash.  Neurological: Positive for weakness. Negative for focal weakness and seizures.  Endo/Heme/Allergies: Does not bruise/bleed easily.  Psychiatric/Behavioral: Negative for hallucinations and suicidal ideas.    Physical Exam: Vital signs: Vitals:   12/29/16 2141 12/30/16 0535  BP: (!) 147/73 (!) 150/70  Pulse: 73 89  Resp: 18 18  Temp: 98.5 F (36.9 C) 98.9 F (37.2 C)   Last BM Date: 12/29/16 General:   Alert,  Well-developed, well-nourished, pleasant and cooperative in NAD HEENT: Normocephalic, atraumatic, extraocular movement intact. No scleral icterus. Lungs:  Clear throughout to auscultation.   No wheezes, crackles, or rhonchi. No acute distress. Heart:  Regular rate and rhythm; no murmurs, clicks, rubs,  or gallops. Abdomen: Soft, nontender, nondistended, bowel sounds present. No peritoneal signs. LE:  No edema. Pulses present Rectal:  External hemorrhoids noted. No rectal mass. Small amount of fresh blood noted on the finger  GI:  Lab Results:  Recent Labs  12/28/16 0408 12/29/16 0529 12/30/16 0544  WBC 13.2* 10.0 11.8*  HGB 9.0* 8.4* 8.9*  HCT 29.3* 27.5* 29.6*  PLT 404* 358 394   BMET  Recent Labs  12/28/16 0408 12/29/16 0529 12/30/16 0544  NA 139 138 137  K 2.9* 3.9 4.8  CL 104 105 107  CO2 24 25 20*  GLUCOSE 121* 132* 149*  BUN _0 CREATININE 0.80 0.92 0.73  CALCIUM 8.5* 8.2* 8.5*   LFT  Recent Labs  12/28/16 0408  PROT 7.0  ALBUMIN 2.6*  AST 23  ALT 16  ALKPHOS 54  BILITOT 0.3   PT/INR No results for input(s): LABPROT, INR in the last 72 hours.   Studies/Results: No results found.  Impression/Plan: - Painless hematochezia. Patient's vital stable. Hemoglobin actually better than yesterday. Rectal exam showed external hemorrhoids and small amount of fresh blood on the finger. Most likely hemorrhoidal bleed versus diverticular bleed versus AVM - Abnormal CT scan showing possible duodenal ulcer. Patient denied any abdominal pain. Normal BUN - Chronic anemia - Possible SIRS   Recommendations -------------------------- -  She is having probably rectal bleeding based on description and rectal exam showing streaks of fresh blood on the finger. Although patient does have a abnormal CT scan showing possible duodenal ulcer, she is hemodynamically stable and her hemoglobin is stable along with normal BUN. - Monitor H&H. Transfuse as needed. Keep patient on clear liquid diet for now.IV twice a day PPI - If she continues to have bleeding, will consider EGD and colonoscopy for further evaluation. - GI will follow.    LOS: 3 days   Otis Brace  MD, FACP 12/30/2016, 8:40 AM  Pager [phone] If no answer or after 5 PM call [phone]

## 2017-01-01 ENCOUNTER — Encounter (HOSPITAL_COMMUNITY): Payer: Self-pay | Admitting: Gastroenterology

## 2017-01-01 DIAGNOSIS — M272 Inflammatory conditions of jaws: Secondary | ICD-10-CM

## 2017-01-01 DIAGNOSIS — A419 Sepsis, unspecified organism: Principal | ICD-10-CM

## 2017-01-01 DIAGNOSIS — K922 Gastrointestinal hemorrhage, unspecified: Secondary | ICD-10-CM

## 2017-01-01 DIAGNOSIS — I10 Essential (primary) hypertension: Secondary | ICD-10-CM

## 2017-01-01 LAB — CBC
HEMATOCRIT: 27.9 % — AB (ref 36.0–46.0)
HEMOGLOBIN: 8.9 g/dL — AB (ref 12.0–15.0)
MCH: 29 pg (ref 26.0–34.0)
MCHC: 31.9 g/dL (ref 30.0–36.0)
MCV: 90.9 fL (ref 78.0–100.0)
Platelets: 314 10*3/uL (ref 150–400)
RBC: 3.07 MIL/uL — ABNORMAL LOW (ref 3.87–5.11)
RDW: 14.8 % (ref 11.5–15.5)
WBC: 8.2 10*3/uL (ref 4.0–10.5)

## 2017-01-01 LAB — BASIC METABOLIC PANEL
Anion gap: 7 (ref 5–15)
BUN: 10 mg/dL (ref 6–20)
CHLORIDE: 108 mmol/L (ref 101–111)
CO2: 21 mmol/L — AB (ref 22–32)
CREATININE: 0.92 mg/dL (ref 0.44–1.00)
Calcium: 8.2 mg/dL — ABNORMAL LOW (ref 8.9–10.3)
GFR calc non Af Amer: 56 mL/min — ABNORMAL LOW (ref >60)
Glucose, Bld: 116 mg/dL — ABNORMAL HIGH (ref 65–99)
Potassium: 5.6 mmol/L — ABNORMAL HIGH (ref 3.5–5.1)
Sodium: 136 mmol/L (ref 135–145)

## 2017-01-01 LAB — CULTURE, BLOOD (ROUTINE X 2)
Culture: NO GROWTH
Culture: NO GROWTH
SPECIAL REQUESTS: ADEQUATE
Special Requests: ADEQUATE

## 2017-01-01 LAB — ECHOCARDIOGRAM COMPLETE
HEIGHTINCHES: 62 in
Weight: 2198.4 oz

## 2017-01-01 LAB — GLUCOSE, CAPILLARY
GLUCOSE-CAPILLARY: 118 mg/dL — AB (ref 65–99)
GLUCOSE-CAPILLARY: 131 mg/dL — AB (ref 65–99)
Glucose-Capillary: 120 mg/dL — ABNORMAL HIGH (ref 65–99)
Glucose-Capillary: 108 mg/dL — ABNORMAL HIGH (ref 65–99)

## 2017-01-01 MED ORDER — SODIUM POLYSTYRENE SULFONATE 15 GM/60ML PO SUSP
30.0000 g | Freq: Once | ORAL | Status: AC
  Administered 2017-01-01: 30 g via ORAL
  Filled 2017-01-01: qty 120

## 2017-01-01 MED ORDER — LORAZEPAM 2 MG/ML IJ SOLN
0.5000 mg | Freq: Once | INTRAMUSCULAR | Status: AC | PRN
  Administered 2017-01-02: 0.5 mg via INTRAVENOUS
  Filled 2017-01-01: qty 1

## 2017-01-01 MED ORDER — PANTOPRAZOLE SODIUM 40 MG IV SOLR
40.0000 mg | Freq: Two times a day (BID) | INTRAVENOUS | Status: DC
  Administered 2017-01-01 – 2017-01-02 (×2): 40 mg via INTRAVENOUS
  Filled 2017-01-01 (×3): qty 40

## 2017-01-01 MED ORDER — ADULT MULTIVITAMIN W/MINERALS CH
1.0000 | ORAL_TABLET | Freq: Every day | ORAL | Status: DC
  Administered 2017-01-01 – 2017-01-02 (×2): 1 via ORAL
  Filled 2017-01-01 (×2): qty 1

## 2017-01-01 MED ORDER — INSULIN ASPART 100 UNIT/ML ~~LOC~~ SOLN
0.0000 [IU] | Freq: Three times a day (TID) | SUBCUTANEOUS | Status: DC
  Administered 2017-01-02 (×3): 1 [IU] via SUBCUTANEOUS

## 2017-01-01 NOTE — Progress Notes (Signed)
[facility] Gastroenterology Progress Note  [name] 83 y.o. 07/26/1932   Subjective: Feels ok. Denies abd pain. Wants to eat.  Objective: Vital signs in last 24 hours: Vitals:   01/01/17 0937 01/01/17 1328  BP: 138/69 (!) 121/56  Pulse:  69  Resp:  18  Temp:  98.1 F (36.7 C)    Physical Exam: Gen: elderly, alert, no acute distress HEENT: anicteric sclera CV: RRR Chest: CTA B Abd: soft, nontender, nondistended, +BS  Lab Results:  Recent Labs  12/30/16 0544 01/01/17 0710  NA 137 136  K 4.8 5.6*  CL 107 108  CO2 20* 21*  GLUCOSE 149* 116*  BUN 14 10  CREATININE 0.73 0.92  CALCIUM 8.5* 8.2*   No results for input(s): AST, ALT, ALKPHOS, BILITOT, PROT, ALBUMIN in the last 72 hours.  Recent Labs  12/30/16 0544  12/31/16 0146 01/01/17 0710  WBC 11.8*  --  14.5* 8.2  NEUTROABS 8.0*  --   --   --   HGB 8.9*  < > 9.4* 8.9*  HCT 29.6*  < > 29.0* 27.9*  MCV 90.5  --  89.2 90.9  PLT 394  --  299 314  < > = values in this interval not displayed. No results for input(s): LABPROT, INR in the last 72 hours.    Assessment/Plan: GI bleed likely diverticular. Duodenal bulb ulcers noted on EGD yesterday without active bleeding. Change to Protonix 40 mg IV Q 12 hours and then PPI PO BID when tolerating solid food (continue PPI PO BID for 3 months then QD). Advance diet. No plan for further invasive GI procedures. Will sign off. Call if questions. F/U with GI in 6-8 weeks.   [place] C. 01/01/2017, 3:28 PM   Pager [phone]  AFTER 5 pm or on weekends call [phone] ID: [name], female   DOB: 1932-03-07, 83 y.o.   MRN: [id number]

## 2017-01-01 NOTE — Progress Notes (Addendum)
PROGRESS NOTE        PATIENT DETAILS Name: Joy LEICHTER Age: 83 y.o. Sex: female Date of Birth: 04-22-1932 Admit Date: 12/27/2016 Admitting Physician Nita Sells, MD CNO:[id number], Ovid Curd, PA-C  Brief Narrative: Patient is a 83 y.o. female with past medical history of jaw osteomyelitis, admitted with progressive weakness, nausea vomiting and ongoing fever, she was thought to have sepsis secondary to ongoing osteomyelitis of the jaw, she was subsequently admitted and started on empiric broad-spectrum IV antimicrobial therapy. Hospital course has been complicated by GI bleeding and acute blood loss anemia. See below for further details  Subjective: Lying comfortably in bed-denied any chest pain or shortness of breath.  Assessment/Plan: Sepsis likely secondary to jaw osteomyelitis: Sepsis pathophysiology has resolved, she is clinically improved-CULTURES negative so for. Seen by infectious disease, recommendations are to continue with Unasyn on discharge with end date of 01/29/2017. Place PICC line, have asked case management to set up home health services.  GI bleeding: Seems to have resolved-per patient her stools were brown yesterday. EGD on 5/29 showed multiple nonhealing duodenal ulcers. Remains on a PPI infusion, GI following-slowly advance diet.  Hyperkalemia: Kayexalate 1, repeat electrolytes tomorrow  Hypertension: Blood pressure control-continue amlodipine and Coreg  DM 2: CBGs stable with SSI-continue to hold metformin  GERD: Continue PPI-EGD also demonstrated a benign-appearing esophageal stenosis and reflux esophagitis.  Chronic back pain: Continue prn Norco, Flexeril. Given duodenal ulcers-best not to resume NSAIDs/diclofenac   History of CVA: Nonfocal exam-unable to use antiplatelet agents given GI bleeding with duodenal ulceration  Morning labs/Imaging ordered: yes  DVT Prophylaxis: SCD's  Code Status:  DNR  Family  Communication: Spoke with daughter at bedside  Disposition Plan: Remain inpatient-but will plan on Home health hopefully in the next day or so  Antimicrobial agents: Anti-infectives    Start     Dose/Rate Route Frequency Ordered Stop   12/31/16 1800  Ampicillin-Sulbactam (UNASYN) 3 g in sodium chloride 0.9 % 100 mL IVPB     3 g 200 mL/hr over 30 Minutes Intravenous Every 6 hours 12/31/16 1720 01/29/17 0559   12/29/16 1230  cephALEXin (KEFLEX) capsule 500 mg  Status:  Discontinued     500 mg Oral Every 12 hours 12/29/16 1221 12/31/16 1635   12/28/16 1330  vancomycin (VANCOCIN) IVPB 1000 mg/200 mL premix  Status:  Discontinued     1,000 mg 200 mL/hr over 60 Minutes Intravenous Every 24 hours 12/27/16 1842 12/29/16 1221   12/27/16 2200  piperacillin-tazobactam (ZOSYN) IVPB 3.375 g  Status:  Discontinued     3.375 g 100 mL/hr over 30 Minutes Intravenous Every 8 hours 12/27/16 1842 12/27/16 1843   12/27/16 2200  piperacillin-tazobactam (ZOSYN) IVPB 3.375 g  Status:  Discontinued     3.375 g 12.5 mL/hr over 240 Minutes Intravenous Every 8 hours 12/27/16 1846 12/29/16 1221   12/27/16 1245  piperacillin-tazobactam (ZOSYN) IVPB 3.375 g     3.375 g 100 mL/hr over 30 Minutes Intravenous  Once 12/27/16 1238 12/27/16 1345   12/27/16 1245  vancomycin (VANCOCIN) IVPB 1000 mg/200 mL premix     1,000 mg 200 mL/hr over 60 Minutes Intravenous  Once 12/27/16 1238 12/27/16 1531      Procedures: EGD 5/29>>  CONSULTS:  GI  Time spent: 25 minutes-Greater than 50% of this time was spent in counseling, explanation of diagnosis, planning of further  management, and coordination of care.  MEDICATIONS: Scheduled Meds: . amLODipine  5 mg Oral Daily  . carvedilol  25 mg Oral BID WC  . gabapentin  300 mg Oral TID  . mirtazapine  30 mg Oral QHS  . [START ON 01/04/2017] pantoprazole  40 mg Intravenous Q12H  . protein supplement shake  11 oz Oral BID BM   Continuous Infusions: . ampicillin-sulbactam  (UNASYN) IV Stopped (01/01/17 1250)  . pantoprozole (PROTONIX) infusion 8 mg/hr (01/01/17 0221)   PRN Meds:.alum & mag hydroxide-simeth, baclofen, cyclobenzaprine, HYDROcodone-acetaminophen, ondansetron   PHYSICAL EXAM: Vital signs: Vitals:   12/31/16 2153 01/01/17 0521 01/01/17 0937 01/01/17 1328  BP: (!) 108/93 130/69 138/69 (!) 121/56  Pulse: 64 70  69  Resp: _0 Temp: 99.8 F (37.7 C) 98.7 F (37.1 C)  98.1 F (36.7 C)  TempSrc:    Oral  SpO2: 90%   99%  Weight:      Height:       Filed Weights   12/27/16 1846  Weight: 62.3 kg (137 lb 6.4 oz)   Body mass index is 25.13 kg/m.   General appearance :Awake, alert, not in any distress. Speech Clear. Not toxic Looking Eyes:, pupils equally reactive to light and accomodation HEENT: Atraumatic and Normocephalic Neck: supple, no JVD. No cervical lymphadenopathy. No thyromegaly Resp:Good air entry bilaterally, no added sounds  CVS: S1 S2 regular, no murmurs.  GI: Bowel sounds present, Non tender and not distended with no gaurding, rigidity or rebound.No organomegaly Extremities: B/L Lower Ext shows no edema, both legs are warm to touch Neurology:  speech clear,Non focal, sensation is grossly intact. Psychiatric: Normal judgment and insight. Alert and oriented x 3.  Musculoskeletal:No digital cyanosis Skin:No Rash, warm and dry Wounds:N/A  I have personally reviewed following labs and imaging studies  LABORATORY DATA: CBC:  Recent Labs Lab 12/27/16 1200 12/28/16 0408 12/29/16 0529 12/30/16 0544 12/30/16 0815 12/30/16 1519 12/31/16 0146 01/01/17 0710  WBC 21.6* 13.2* 10.0 11.8*  --   --  14.5* 8.2  NEUTROABS 17.6*  --   --  8.0*  --   --   --   --   HGB 10.7* 9.0* 8.4* 8.9* 8.5* 6.8* 9.4* 8.9*  HCT 33.7* 29.3* 27.5* 29.6* 28.2* 22.2* 29.0* 27.9*  MCV 88.7 89.3 89.3 90.5  --   --  89.2 90.9  PLT 545* 404* 358 394  --   --  299 062    Basic Metabolic Panel:  Recent Labs Lab 12/27/16 1200  12/28/16 0408 12/29/16 0529 12/30/16 0544 01/01/17 0710  NA 138 139 138 137 136  K 3.5 2.9* 3.9 4.8 5.6*  CL 100* 104 105 107 108  CO2 _1 20* 21*  GLUCOSE 217* 121* 132* 149* 116*  BUN _2 CREATININE 0.81 0.80 0.92 0.73 0.92  CALCIUM 9.9 8.5* 8.2* 8.5* 8.2*    GFR: Estimated Creatinine Clearance: 40.2 mL/min (by C-G formula based on SCr of 0.92 mg/dL).  Liver Function Tests:  Recent Labs Lab 12/27/16 1200 12/28/16 0408  AST 27 23  ALT 17 16  ALKPHOS 69 54  BILITOT 0.3 0.3  PROT 8.9* 7.0  ALBUMIN 3.2* 2.6*   No results for input(s): LIPASE, AMYLASE in the last 168 hours. No results for input(s): AMMONIA in the last 168 hours.  Coagulation Profile: No results for input(s): INR, PROTIME in the last 168 hours.  Cardiac Enzymes: No results for input(s): CKTOTAL, CKMB, CKMBINDEX,  TROPONINI in the last 168 hours.  BNP (last 3 results) No results for input(s): PROBNP in the last 8760 hours.  HbA1C: No results for input(s): HGBA1C in the last 72 hours.  CBG:  Recent Labs Lab 12/31/16 0832 12/31/16 1724 12/31/16 2153 01/01/17 0827 01/01/17 1157  GLUCAP 129* 144* 108* 120* 118*    Lipid Profile: No results for input(s): CHOL, HDL, LDLCALC, TRIG, CHOLHDL, LDLDIRECT in the last 72 hours.  Thyroid Function Tests: No results for input(s): TSH, T4TOTAL, FREET4, T3FREE, THYROIDAB in the last 72 hours.  Anemia Panel: No results for input(s): VITAMINB12, FOLATE, FERRITIN, TIBC, IRON, RETICCTPCT in the last 72 hours.  Urine analysis:    Component Value Date/Time   COLORURINE YELLOW 12/27/2016 1636   APPEARANCEUR CLEAR 12/27/2016 1636   LABSPEC 1.045 (H) 12/27/2016 1636   PHURINE 7.0 12/27/2016 1636   GLUCOSEU NEGATIVE 12/27/2016 1636   HGBUR NEGATIVE 12/27/2016 1636   BILIRUBINUR NEGATIVE 12/27/2016 1636   KETONESUR NEGATIVE 12/27/2016 1636   PROTEINUR NEGATIVE 12/27/2016 1636   UROBILINOGEN 1.0 04/04/2012 1310   NITRITE NEGATIVE  12/27/2016 1636   LEUKOCYTESUR NEGATIVE 12/27/2016 1636    Sepsis Labs: Lactic Acid, Venous    Component Value Date/Time   LATICACIDVEN 1.28 12/27/2016 1541    MICROBIOLOGY: Recent Results (from the past 240 hour(s))  Blood Culture (routine x 2)     Status: None (Preliminary result)   Collection Time: 12/27/16 12:08 PM  Result Value Ref Range Status   Specimen Description BLOOD RIGHT ANTECUBITAL  Final   Special Requests   Final    BOTTLES DRAWN AEROBIC AND ANAEROBIC Blood Culture adequate volume   Culture NO GROWTH 4 DAYS  Final   Report Status PENDING  Incomplete  Blood Culture (routine x 2)     Status: None (Preliminary result)   Collection Time: 12/27/16  1:16 PM  Result Value Ref Range Status   Specimen Description BLOOD LEFT ANTECUBITAL  Final   Special Requests   Final    BOTTLES DRAWN AEROBIC AND ANAEROBIC Blood Culture adequate volume   Culture NO GROWTH 4 DAYS  Final   Report Status PENDING  Incomplete    RADIOLOGY STUDIES/RESULTS: Dg Chest 2 View  Result Date: 12/27/2016 CLINICAL DATA:  Fever EXAM: CHEST  2 VIEW COMPARISON:  12/09/2016 FINDINGS: Improved lung volume and improved aeration in the lung bases since prior study. Lungs are now clear without infiltrate or effusion. Negative for heart failure. Atherosclerotic aorta. IMPRESSION: No active cardiopulmonary disease. Electronically Signed   By: Franchot Gallo M.D.   On: 12/27/2016 12:48   Dg Lumbar Spine Complete  Result Date: 12/03/2016 CLINICAL DATA:  Increased back pain radiating into the left leg after lifting heavy plantar 2 days ago. The patient is unable to lie supine for imaging and [place] images were obtained standing. History of previous back surgery. EXAM: LUMBAR SPINE - COMPLETE 4+ VIEW COMPARISON:  Lumbar spine series of November 16, 2015 and MRI of the lumbar spine of July 05, 2016 FINDINGS: There is chronic curvature convex toward the left centered at L3-4 which appears stable allowing for the standing  positioning. The vertebral bodies are preserved in height. There is moderate disc space narrowing at all lumbar levels. There is no significant spondylolisthesis. Endplate osteophytes are present at multiple levels. There is facet joint hypertrophy at L5-S1. The observed portions of the sacrum are normal. Is calcification in the wall of the abdominal aorta and iliac arteries. IMPRESSION: Moderate to severe disc space height loss  at all lumbar levels. No acute compression fracture. Stable mild to moderate levocurvature centered at L3-4. If the patient's symptoms persist, repeat lumbar spine MRI would be useful. Electronically Signed   By: David  Martinique M.D.   On: 12/03/2016 14:42   Ct Head Wo Contrast  Result Date: 12/09/2016 CLINICAL DATA:  Fever and altered level of consciousness. EXAM: CT HEAD WITHOUT CONTRAST TECHNIQUE: Contiguous axial images were obtained from the base of the skull through the vertex without intravenous contrast. COMPARISON:  None. FINDINGS: Brain: Mild cerebral atrophy. No ventricular dilatation. Low-attenuation changes throughout the deep white matter likely representing small vessel ischemia. No mass effect or midline shift. No abnormal extra-axial fluid collections. Gray-white matter junctions are distinct. Basal cisterns are not effaced. No evidence of acute intracranial hemorrhage. Vascular: No hyperdense vessel or unexpected calcification. Skull: Normal. Negative for fracture or focal lesion. Sinuses/Orbits: No acute finding. Other: None. IMPRESSION: No acute intracranial abnormalities. Electronically Signed   By: Lucienne Capers M.D.   On: 12/09/2016 00:57   Mr Lumbar Spine W Wo Contrast  Result Date: 12/10/2016 CLINICAL DATA:  Low back pain. Chills and night sweats. Recent history of osteomyelitis of the jaw. Previous back surgery. EXAM: MRI LUMBAR SPINE WITHOUT AND WITH CONTRAST TECHNIQUE: Multiplanar and multiecho pulse sequences of the lumbar spine were obtained without and  with intravenous contrast. CONTRAST:  15 cc MultiHance COMPARISON:  CT 12/05/2016 FINDINGS: Segmentation:  5 lumbar type vertebral bodies. Alignment: Curvature convex to the left with the apex at L3. Straightening the normal cervical lordosis. 2 mm anterolisthesis L4-5. Vertebrae: No fracture or primary bone lesion. Chronic discogenic marrow changes. Conus medullaris: Extends to the L1 level and appears normal. Paraspinal and other soft tissues: Negative except for renal cysts. Disc levels: No significant finding at T11-12 or T12-L1. L1-2: Endplate osteophytes and bulging of the disc. Mild narrowing of both lateral recesses without visible neural compression. L2-3: Endplate osteophytes and bulging of the disc. Mild facet hypertrophy on the right. Mild narrowing of the lateral recesses right more than left. No definite neural compression. L3-4: Endplate osteophytes and bulging of the disc. Mild facet and ligamentous hypertrophy. Bilateral lateral recess stenosis that could possibly cause neural compression. L4-5: Previous right hemilaminectomy. 2 mm anterolisthesis because of facet osteoarthritis. Endplate osteophytes and minimal bulging of the disc. Mild stenosis of the left lateral recess. L5-S1: Left posterolateral to foraminal disc herniation that could compress the left L5 nerve root. Mild facet and ligamentous hypertrophy with narrowing of the subarticular lateral recesses left more than right. No finding to suggest spinal or paraspinal infection. IMPRESSION: No finding to suggest spinal or paraspinal infection. Curvature convex to the left with the apex at L3. Straightening of the normal lumbar lordosis. 2 mm anterolisthesis L4-5. Degenerative disc disease and degenerative facet disease. Narrowing of the lateral recesses could cause neural compression on the right at L2-3, bilaterally at L3-4 and on the left at L4-5. At L5-S1, there is a left foraminal disc herniation which would likely compress the left L5  nerve root. Mild narrowing of the subarticular lateral recess on the left. Electronically Signed   By: Nelson Chimes M.D.   On: 12/10/2016 13:55   Ct Abdomen Pelvis W Contrast  Result Date: 12/27/2016 CLINICAL DATA:  Continued weakness, leukocytosis, recently hospitalized for urosepsis, vomiting, fever, loss of appetite EXAM: CT ABDOMEN AND PELVIS WITH CONTRAST TECHNIQUE: Multidetector CT imaging of the abdomen and pelvis was performed using the standard protocol following bolus administration of intravenous contrast. Sagittal  and coronal MPR images reconstructed from axial data set. CONTRAST:  1m ISOVUE-300 IOPAMIDOL (ISOVUE-300) INJECTION 61% IV. Dilute oral contrast. COMPARISON:  06/30/2014 FINDINGS: Lower chest: Lung bases clear Hepatobiliary: Multiple hepatic cysts. Liver otherwise unremarkable. Question mild gallbladder wall thickening. Mild edema is seen in the pericholecystic fat though this is also seen adjacent to the duodenal bulb, which appears thickened. Gallbladder does not appear to be the epicenter of this process. No definite calcified gallstones. Pancreas: Normal appearance Spleen: Normal appearance Adrenals/Urinary Tract: Adrenal glands normal appearance. Tiny LEFT renal cysts. Complex mass lateral aspect upper pole RIGHT kidney containing fluid, calcifications, question minimal fat, associated with cortical thinning, question cortical scar/infarct, unchanged. Kidneys, ureters, and bladder otherwise normal appearance. Stomach/Bowel: As noted above, duodenal bulb wall thickening and very duodenal infiltration of fat extending to the gallbladder, raising question of duodenal ulcer disease. Questionable collection of gas is seen within the duodenal wall versus redundant mucosa. Fat planes between the duodenum and pancreatic head appear ill defined though pancreas is otherwise normal in appearance. Distal colonic diverticulosis without evidence of diverticulitis. Nonspecific mildly prominent  small bowel loops in the LEFT mid abdomen without evidence of obstruction. Remaining bowel loops unremarkable. Moderate-sized hiatal hernia with remainder of stomach unremarkable. Vascular/Lymphatic: Atherosclerotic calcifications aorta without aneurysm. Additional atherosclerotic calcifications head coronary arteries, iliac arteries and the origins of the celiac artery, renal arteries, and SMA. Reproductive: Uterus surgically absent. Nonvisualization of ovaries. Other: No free air free fluid.  No definite hernia. Musculoskeletal: Bones demineralized with degenerative disc and facet disease changes lumbar spine. IMPRESSION: Duodenal wall thickening with very duodenal infiltrative changes extending to the gallbladder and adjacent to the pancreatic head, raising question of duodenal ulcer disease. No definite extra intestinal gas is identified though a small focus of gas is likely present within the duodenal wall. Hepatic and renal cysts with complex cystic lesion with calcification at the upper pole of the RIGHT kidney, favor sequela of prior hemorrhage or infarct. Moderate-sized hiatal hernia. Distal colonic diverticulosis without evidence of diverticulitis. Aortic atherosclerosis and coronary arterial calcification as above. Electronically Signed   By: MLavonia DanaM.D.   On: 12/27/2016 16:12   Ct Maxillofacial W Contrast  Result Date: 12/27/2016 CLINICAL DATA:  Abnormal right mandible, concerning for osteomyelitis. History of dry socket in osteomyelitis related to recent dental extractions. EXAM: CT MAXILLOFACIAL WITH CONTRAST TECHNIQUE: MMultidetector CT imaging of the maxillofacial structures was performed with intravenous contrast. Multiplanar CT image reconstructions were also generated. A small metallic BB was placed on the right temple in order to reliably differentiate right from left. COMPARISON:  12/09/2016 FINDINGS: Osseous: Gas still seen within the right molar alveolus, with minimal fluid or debris.  Semi-solid material and metallic density seen previously is no longer identified. No soft tissue abscess or inflammation noted. Intermittently seen gas between the tongue and floor of mouth without definite soft tissue emphysema. No bony sclerosis, erosion, or involucrum to suggest acute osteomyelitis. Orbits: Bilateral cataract resection.  No inflammation or mass. Sinuses: Negative.  No sinusitis or sinus obstruction. Soft tissues: No cellulitic changes noted. Prominent atherosclerotic calcification at the carotid bifurcations. Limited intracranial: Negative IMPRESSION: Persistent dry socket appearance of the right mandibular molar extraction site. No soft tissue abscess or detected cellulitis. No bony changes of osteomyelitis. Electronically Signed   By: JMonte FantasiaM.D.   On: 12/27/2016 14:24   Ct Maxillofacial W Contrast  Result Date: 12/09/2016 CLINICAL DATA:  Altered mental status worsening for 3 days. Fever, dental infection. Status post  multiple dental extractions February and March. Status post biopsy demonstrating reported osteomyelitis. History of cancer and radiation. EXAM: CT MAXILLOFACIAL WITH CONTRAST TECHNIQUE: Multidetector CT imaging of the maxillofacial structures was performed. Multiplanar CT image reconstructions were also generated. A small metallic BB was placed on the right temple in order to reliably differentiate right from left. COMPARISON:  None. FINDINGS: OSSEOUS: The mandible is intact, the condyles are located. No acute facial fracture. No destructive bony lesions. Absent maxillary teeth. Residual maxillary teeth demonstrate dental caries. Severe RIGHT temporomandibular osteoarthrosis. Small metallic foreign body within RIGHT posterior mandible dental socket with packing material. ORBITS: Ocular globes and orbital contents are normal. SINUSES: Paranasal sinuses are well aerated. Nasal septum is midline. Trace LEFT mastoid effusion. SOFT TISSUES: No significant soft tissue  swelling. No subcutaneous gas or radiopaque foreign bodies. Moderate calcific atherosclerosis of the carotid siphons. LIMITED INTRACRANIAL: Normal. IMPRESSION: Packing material and radiopaque foreign body within RIGHT mandible molar socket, recommend direct inspection. No CT findings of drainable fluid collection or acute process in the face. Electronically Signed   By: Elon Alas M.D.   On: 12/09/2016 06:35   Dg Chest Port 1 View  Result Date: 12/08/2016 CLINICAL DATA:  Acute onset of lethargy and confusion. Recent urinary tract infection. Initial encounter. EXAM: PORTABLE CHEST 1 VIEW COMPARISON:  Chest radiograph performed 11/13/2016 FINDINGS: The lungs are well-aerated. Mild bibasilar atelectasis or scarring is noted. Peribronchial thickening is seen. There is no evidence of pleural effusion or pneumothorax. The cardiomediastinal silhouette is borderline enlarged. No acute osseous abnormalities are seen. IMPRESSION: Mild bibasilar atelectasis or scarring noted. Peribronchial thickening seen. Borderline cardiomegaly. Electronically Signed   By: Garald Balding M.D.   On: 12/08/2016 23:38     LOS: 5 days   Oren Binet, MD  [facility] Pager:[phone]  If 7PM-7AM, please contact night-coverage [email or web address] Password [email or web address] 01/01/2017, 1:37 PM

## 2017-01-01 NOTE — Progress Notes (Signed)
Nutrition Follow-up  DOCUMENTATION CODES:   Not applicable  INTERVENTION:   -Continue Premier Protein BID -MVI daily  NUTRITION DIAGNOSIS:   Inadequate oral intake related to acute illness, nausea, vomiting, poor appetite as evidenced by per patient/family report, meal completion < 25%.  Ongoing  GOAL:   Patient will meet greater than or equal to 90% of their needs  Progressing  MONITOR:   PO intake, Supplement acceptance, Weight trends, Labs  REASON FOR ASSESSMENT:   Malnutrition Screening Tool    ASSESSMENT:   Patient had several dental extractions performed in February-March 2018. Evaluation revealed infectious concerns in lead to a final diagnosis of osteomyelitis. Last week patient began having night sweats. She developed altered mental status with hallucinations and agitation prompting a visit to the [facility] emergency room last Thursday where patient was diagnosed with UTI and was started on Keflex. Because of persistent issues with lethargy, altered mental status and persistent back pain family brought patient in for further evaluation here in the ER.  5/28- GI consulted; GIB 5/29- s/p upper GI series, which revealed benign reflux esophagitis and multiple non-bleeding duodenal ulcers  Pt sleeping soundly at time of visit and did not arouse to name being called.   Pt was just advanced to full liquids for lunch (pt was on clear liquid diet for 2 days due to upper GI series and rectal bleeding). Therefore, pt was unable to receive Premier Protein supplements at this time. Noted unopened Premier Protein supplement near sink. Intake remains poor; noted 30% meal completion.   Labs reviewed: K: 5.6, CBGS: 108-144.   Diet Order:  Diet full liquid Room service appropriate? Yes; Fluid consistency: Thin  Skin:  Reviewed, no issues  Last BM:  12/31/16  Height:   Ht Readings from Last 1 Encounters:  12/27/16 5' 2" (1.575 m)    Weight:   Wt Readings from  Last 1 Encounters:  12/27/16 137 lb 6.4 oz (62.3 kg)    Ideal Body Weight:  50 kg  BMI:  Body mass index is 25.13 kg/m.  Estimated Nutritional Needs:   Kcal:  1560-1745 (25-28 kcal/kg)  Protein:  70-80 grams (1.1-1.3 grams/kg)  Fluid:  >/= 1.8 L/day  EDUCATION NEEDS:   No education needs identified at this time  Ervey Fallin A. Jimmye Norman, RD, LDN, CDE Pager: [phone] After hours Pager: [phone]

## 2017-01-01 NOTE — Progress Notes (Signed)
[facility]    Date of Admission:  12/27/2016   Total days of antibiotics 6        Day 2 Amp-Sub            ID: [name] is a 83 y.o. female with persistent osteomyelitis of her right mandible with recurrent fevers and leukocytosis. She is not able to tolerate the hyperbaric therapy well. Currently maintained only on Keflex.  Active Problems:   Sepsis ([place])   Abnormal CT scan, gastrointestinal tract   GI bleed   Subjective: Feeling good but anxious about the PICC line - failed first attempt at placement.   Medications:  . amLODipine  5 mg Oral Daily  . carvedilol  25 mg Oral BID WC  . gabapentin  300 mg Oral TID  . mirtazapine  30 mg Oral QHS  . [START ON 01/04/2017] pantoprazole  40 mg Intravenous Q12H  . protein supplement shake  11 oz Oral BID BM    Objective: Vital signs in last 24 hours: Temp:  [98.1 F (36.7 C)-99.8 F (37.7 C)] 98.1 F (36.7 C) (05/30 1328) Pulse Rate:  [64-70] 69 (05/30 1328) Resp:  [18] 18 (05/30 1328) BP: (108-138)/(51-93) 121/56 (05/30 1328) SpO2:  [90 %-99 %] 99 % (05/30 1328)  General appearance: alert, cooperative, no distress and pleasant and well nourished.  Neck: no adenopathy, no carotid bruit, no JVD, supple, symmetrical, trachea midline and thyroid not enlarged, symmetric, no tenderness/mass/nodules Cardio: regular rate and rhythm and S1, S2 normal GI: soft, non-tender; bowel sounds normal; no masses,  no organomegaly Extremities: extremities normal, atraumatic, no cyanosis or edema Skin: Skin color, texture, turgor normal. No rashes or lesions Neurologic: Alert and oriented X 3, normal strength and tone.   Lab Results  Recent Labs  12/30/16 0544  12/31/16 0146 01/01/17 0710  WBC 11.8*  --  14.5* 8.2  HGB 8.9*  < > 9.4* 8.9*  HCT 29.6*  < > 29.0* 27.9*  NA 137  --   --  136  K 4.8  --   --  5.6*  CL 107  --   --  108  CO2 20*  --   --  21*  BUN 14  --   --  10  CREATININE 0.73  --   --   0.92  < > = values in this interval not displayed. Liver Panel No results for input(s): PROT, ALBUMIN, AST, ALT, ALKPHOS, BILITOT, BILIDIR, IBILI in the last 72 hours. Sedimentation Rate No results for input(s): ESRSEDRATE in the last 72 hours. C-Reactive Protein No results for input(s): CRP in the last 72 hours.  Microbiology:  Studies/Results: No results found.   Assessment/Plan: 83 yo female with persistent osteomyelitis of her right mandible with recurrent fevers and leukocytosis. She is not able to tolerate the hyperbaric therapy well.   OM of Right Mandible  - Fever and WBC improved in switching antibiotics; Sed rate >140 (5/27) - Continue Unasyn IV 3g q6h for 4 weeks as planned with transition to PO to finish out treatment. She plans on returning to hyperbaric treatments once she D/C's home.  - PICC attempt later this afternoon with Ativan per primary team -TTE report in process now -OPAT consult placed  Anemia - EGD with some ulcerations that were not bleeding at the time.  - Hgb 9.4 >> 8.9 today  Janene Madeira, MSN, NP-C [facility] [place] Cell: [phone] Pager: [phone] -------------------  Patient was seen, examined,treatment plan was discussed with the Physician extender. I have directly reviewed the clinical findings, lab, imaging studies and management of this patient in detail. I have made the necessary changes to the above noted documentation, and agree with the documentation, as recorded by the Physician extender.  Breane denies any fevers, no significant jaw pain but reports having pain with picc line placement attempt.  Continue with plan for IV abtx for jaw osteo.  Elzie Rings [facility] [phone] pager [phone] cell 01/01/2017, 1:43 PM

## 2017-01-01 NOTE — Care Management Note (Signed)
Case Management Note  Patient Details  Name: [name] MRN: [id number] Date of Birth: 16-Dec-1931  Subjective/Objective:            Admitted with Sepsis/ jaw osteomyelitis. From home with family.  Georgina Quint (Daughter) Abran Cantor (Daughter)    [phone]     PCP: [name]  Action/Plan:  PICC line pending..... ID following....recommend 4 wk of iv amp. Plan is to d/c to home with home health services (IV ABX home infusion/RN,  and resumption of HHPT.  Expected Discharge Date:     01/02/2017           Expected Discharge Plan:  [place]  In-House Referral:     Discharge planning Services  CM Consult  Post Acute Care Choice:  Resumption of Svcs/PTA Provider, Durable Medical Equipment Choice offered to:  Patient  DME Arranged:  IV pump/equipment DME Agency:  [place]. referral made with Pam @ [phone]  [facility] Arranged:  PT, RN (pt states active with [employer], HHPT), referral made with Manuela Schwartz @ [phone] [place]:  [place]  Status of Service:  Completed, signed off  If discussed at [place] of Stay Meetings, dates discussed:    Additional Comments:  Sharin Mons, RN 01/01/2017, 11:32 AM

## 2017-01-01 NOTE — Progress Notes (Signed)
Peripherally Inserted Central Catheter/Midline Placement  The IV Nurse has discussed with the patient and/or persons authorized to consent for the patient, the purpose of this procedure and the potential benefits and risks involved with this procedure.  The benefits include less needle sticks, lab draws from the catheter, and the patient may be discharged home with the catheter. Risks include, but not limited to, infection, bleeding, blood clot (thrombus formation), and puncture of an artery; nerve damage and irregular heartbeat and possibility to perform a PICC exchange if needed/ordered by physician.  Alternatives to this procedure were also discussed.  B[employer] PICC patient education guide, fact sheet on infection prevention and patient information card has been provided to patient /or left at bedside.    PICC/Midline Placement Documentation        [name] 01/01/2017, 11:18 AM

## 2017-01-02 ENCOUNTER — Inpatient Hospital Stay (HOSPITAL_COMMUNITY): Payer: Medicare Other

## 2017-01-02 DIAGNOSIS — M869 Osteomyelitis, unspecified: Secondary | ICD-10-CM

## 2017-01-02 HISTORY — PX: IR US GUIDE VASC ACCESS RIGHT: IMG2390

## 2017-01-02 HISTORY — PX: IR FLUORO GUIDE CV LINE RIGHT: IMG2283

## 2017-01-02 LAB — CBC
HEMATOCRIT: 28 % — AB (ref 36.0–46.0)
Hemoglobin: 8.7 g/dL — ABNORMAL LOW (ref 12.0–15.0)
MCH: 28.2 pg (ref 26.0–34.0)
MCHC: 31.1 g/dL (ref 30.0–36.0)
MCV: 90.6 fL (ref 78.0–100.0)
Platelets: 360 10*3/uL (ref 150–400)
RBC: 3.09 MIL/uL — ABNORMAL LOW (ref 3.87–5.11)
RDW: 14.5 % (ref 11.5–15.5)
WBC: 11 10*3/uL — ABNORMAL HIGH (ref 4.0–10.5)

## 2017-01-02 LAB — GLUCOSE, CAPILLARY
GLUCOSE-CAPILLARY: 144 mg/dL — AB (ref 65–99)
Glucose-Capillary: 127 mg/dL — ABNORMAL HIGH (ref 65–99)
Glucose-Capillary: 148 mg/dL — ABNORMAL HIGH (ref 65–99)

## 2017-01-02 LAB — BASIC METABOLIC PANEL
Anion gap: 8 (ref 5–15)
BUN: 7 mg/dL (ref 6–20)
CALCIUM: 8.5 mg/dL — AB (ref 8.9–10.3)
CO2: 24 mmol/L (ref 22–32)
CREATININE: 0.85 mg/dL (ref 0.44–1.00)
Chloride: 106 mmol/L (ref 101–111)
GFR calc non Af Amer: >60 mL/min (ref >60)
GLUCOSE: 117 mg/dL — AB (ref 65–99)
Potassium: 4.1 mmol/L (ref 3.5–5.1)
Sodium: 138 mmol/L (ref 135–145)

## 2017-01-02 LAB — H PYLORI, IGM, IGG, IGA AB
H Pylori IgG: <0.8 Index Value (ref 0.00–0.79)
H. PYLOGI, IGM ABS: 12.7 U — AB (ref 0.0–8.9)

## 2017-01-02 MED ORDER — PANTOPRAZOLE SODIUM 40 MG PO TBEC: 40.0000 mg | DELAYED_RELEASE_TABLET | Freq: Two times a day (BID) | ORAL | Status: DC

## 2017-01-02 MED ORDER — PANTOPRAZOLE SODIUM 40 MG PO TBEC: 40.0000 mg | DELAYED_RELEASE_TABLET | Freq: Two times a day (BID) | ORAL | 0 refills | Status: DC

## 2017-01-02 MED ORDER — LIDOCAINE HCL 1 % IJ SOLN
INTRAMUSCULAR | Status: AC
  Filled 2017-01-02: qty 20

## 2017-01-02 MED ORDER — AMPICILLIN-SULBACTAM IV (FOR PTA / DISCHARGE USE ONLY): 3.0000 g | Freq: Four times a day (QID) | INTRAVENOUS | 0 refills | Status: AC

## 2017-01-02 MED ORDER — PREMIER PROTEIN SHAKE: 11.0000 [oz_av] | Freq: Two times a day (BID) | ORAL | 0 refills | Status: DC

## 2017-01-02 MED ORDER — LIDOCAINE HCL (PF) 1 % IJ SOLN
INTRAMUSCULAR | Status: DC | PRN
  Administered 2017-01-02: 5 mL

## 2017-01-02 NOTE — Progress Notes (Signed)
Per nurse IR will place PICC today. [place]

## 2017-01-02 NOTE — Discharge Summary (Signed)
PATIENT DETAILS Name: [name] Age: 83 y.o. Sex: female Date of Birth: 1932/05/26 MRN: [id number]. Admitting Physician: Nita Sells, MD TIR:[id number], Tamala Julian  Admit Date: 12/27/2016 Discharge date: 01/02/2017  Recommendations for Outpatient Follow-up:  1. Follow up with PCP in 1-2 weeks 2. Please obtain BMP/CBC in one week 3. Please ensure follow-up with infectious disease   Admitted From:  Home  Disposition: home with home health services    Home Health: Yes  Equipment/Devices: PICC line-due for placement on 5/31  Discharge Condition: Stable  CODE STATUS: FULL CODE  Diet recommendation:  Heart Healthy / Carb Modified   Brief Summary: See H&P, Labs, Consult and Test reports for all details in brief,Patient is a 83 y.o. female with past medical history of jaw osteomyelitis, admitted with progressive weakness, nausea vomiting and ongoing fever, she was thought to have sepsis secondary to ongoing osteomyelitis of the jaw, she was subsequently admitted and started on empiric broad-spectrum IV antimicrobial therapy. Hospital course has been complicated by GI bleeding and acute blood loss anemia. See below for further details patient was admitted for   Brief Hospital Course: Sepsis likely secondary to jaw osteomyelitis: Sepsis has resolved, much improved, although blood cultures are negative so far. Seen by infectious disease, recommendations are to continue with Unasyn on discharge-stop date June 27.   GI bleeding:  no further hematochezia-GI consulted, underwent endoscopy on 5/29-which showed multiple nonhealing duodenal ulcers, remain on a PPI infusion-has been transitioned to PPI twice a day. Avoid NSAIDs/aspirin. Tolerated advancement in diet, hemoglobin remained stable.   Hyperkalemia: Resolved with Kayexalate.    Hypertension: Controlled-continue amlodipine and Coreg.  DM 2: CBGs stable with resume metformin on discharge  GERD: Continue  PPI-EGD also demonstrated a benign-appearing esophageal stenosis and reflux esophagitis.  Chronic back pain: Continue prn Norco, Flexeril. Given duodenal ulcers-best not to resume NSAIDs/diclofenac   History of CVA: Nonfocal exam-unable to use antiplatelet agents given GI bleeding with duodenal ulceration  Procedures/Studies: EGD 5/29  Discharge Diagnoses:  Active Problems:   Sepsis ([place])   Abnormal CT scan, gastrointestinal tract   GI bleed   Discharge Instructions:  Activity:  As tolerated  Discharge Instructions    Call MD for:  redness, tenderness, or signs of infection (pain, swelling, redness, odor or green/yellow discharge around incision site)    Complete by:  As directed    Diet - low sodium heart healthy    Complete by:  As directed    Diet Carb Modified    Complete by:  As directed    Home infusion instructions [facility] May follow [place] Dosing Protocol; May administer Cathflo as needed to maintain patency of vascular access device.; Flushing of vascular access device: per [facility] Protocol: 0.9% NaCl pre/post medica...    Complete by:  As directed    Instructions:  May follow [place] Dosing Protocol   Instructions:  May administer Cathflo as needed to maintain patency of vascular access device.   Instructions:  Flushing of vascular access device: per [facility] Protocol: 0.9% NaCl pre/post medication administration and prn patency; Heparin 100 u/ml, 8m for implanted ports and Heparin 10u/ml, 526mfor all other central venous catheters.   Instructions:  May follow [facility] Anaphylaxis Protocol for First Dose Administration in the home: 0.9% NaCl at 25-50 ml/hr to maintain IV access for protocol meds. Epinephrine 0.3 ml IV/IM PRN and Benadryl 25-50 IV/IM PRN s/s of anaphylaxis.   Instructions:  [facility] Coordinator (RN) to assist  per patient IV care needs in the home PRN.   Increase activity slowly    Complete by:  As directed      Allergies  as of 01/02/2017      Reactions   Sulfa Antibiotics Swelling   Mouth and tongue swelling      Medication List    STOP taking these medications   diclofenac 50 MG tablet Commonly known as:  CATAFLAM   ranitidine 150 MG tablet Commonly known as:  ZANTAC   traMADol-acetaminophen 37.5-325 MG tablet Commonly known as:  ULTRACET     TAKE these medications   amLODipine 5 MG tablet Commonly known as:  NORVASC Take 5 mg by mouth daily.   ampicillin-sulbactam IVPB Commonly known as:  UNASYN Inject 3 g into the vein every 6 (six) hours. Indication:  Jaw Osteomyelitis Last Day of Therapy:  01/28/17 Labs - Once weekly:  CBC/D and BMP, Labs - Every other week:  ESR and CRP   atorvastatin 40 MG tablet Commonly known as:  LIPITOR Take 40 mg by mouth daily.   baclofen 10 MG tablet Commonly known as:  LIORESAL Take 10 mg by mouth 2 (two) times daily as needed for muscle spasms.   carvedilol 25 MG tablet Commonly known as:  COREG Take 25 mg by mouth 2 (two) times daily with a meal.   cyclobenzaprine 10 MG tablet Commonly known as:  FLEXERIL Take 1 tablet (10 mg total) by mouth 3 (three) times daily as needed for muscle spasms.   gabapentin 300 MG capsule Commonly known as:  NEURONTIN Take 300 mg by mouth 3 (three) times daily.   HYDROcodone-acetaminophen 5-325 MG tablet Commonly known as:  NORCO/VICODIN Take 1 tablet by mouth every 4 (four) hours as needed for moderate pain.   losartan-hydrochlorothiazide 100-25 MG tablet Commonly known as:  HYZAAR Take 1 tablet by mouth daily.   metFORMIN 500 MG 24 hr tablet Commonly known as:  GLUCOPHAGE-XR Take 500 mg by mouth daily with breakfast.   mirtazapine 30 MG tablet Commonly known as:  REMERON Take 30 mg by mouth at bedtime.   pantoprazole 40 MG tablet Commonly known as:  PROTONIX Take 1 tablet (40 mg total) by mouth 2 (two) times daily.   protein supplement shake Liqd Commonly known as:  PREMIER PROTEIN Take 325 mLs (11  oz total) by mouth 2 (two) times daily between meals.            Home Infusion Instuctions        Start     Ordered   01/02/17 0000  Home infusion instructions [facility] May follow [place] Dosing Protocol; May administer Cathflo as needed to maintain patency of vascular access device.; Flushing of vascular access device: per [facility] Protocol: 0.9% NaCl pre/post medica...    Question Answer Comment  Instructions May follow [place] Dosing Protocol   Instructions May administer Cathflo as needed to maintain patency of vascular access device.   Instructions Flushing of vascular access device: per [facility] Protocol: 0.9% NaCl pre/post medication administration and prn patency; Heparin 100 u/ml, 67m for implanted ports and Heparin 10u/ml, 524mfor all other central venous catheters.   Instructions May follow [facility] Anaphylaxis Protocol for First Dose Administration in the home: 0.9% NaCl at 25-50 ml/hr to maintain IV access for protocol meds. Epinephrine 0.3 ml IV/IM PRN and Benadryl 25-50 IV/IM PRN s/s of anaphylaxis.   Instructions [facility] Infusion Coordinator (RN) to assist per patient IV care needs in  the home PRN.      01/02/17 [place] Follow up.   Why:  IV infusion pump/ Antibiotics arranged Contact information: [address] [place] [address] [phone]        Health, [facility] Follow up.   Why:  home health services (RN,PT) arranged , office to call and set up home visits Contact information: [address] [place] [address] [phone]        [name], PA-C. Schedule an appointment as soon as possible for a visit in 1 week(s).   Specialty:  Physician Assistant Contact information: [place] [address] [phone]        Carlyle Basques, MD. Schedule an appointment as soon as possible for a visit in 2 week(s).   Specialty:   Infectious Diseases Contact information: [place] [address] [place] [address] [phone]          Allergies  Allergen Reactions  . Sulfa Antibiotics Swelling    Mouth and tongue swelling    Consultations:   ID and GI  Other Procedures/Studies: Dg Chest 2 View  Result Date: 12/27/2016 CLINICAL DATA:  Fever EXAM: CHEST  2 VIEW COMPARISON:  12/09/2016 FINDINGS: Improved lung volume and improved aeration in the lung bases since prior study. Lungs are now clear without infiltrate or effusion. Negative for heart failure. Atherosclerotic aorta. IMPRESSION: No active cardiopulmonary disease. Electronically Signed   By: Franchot Gallo M.D.   On: 12/27/2016 12:48   Dg Lumbar Spine Complete  Result Date: 12/03/2016 CLINICAL DATA:  Increased back pain radiating into the left leg after lifting heavy plantar 2 days ago. The patient is unable to lie supine for imaging and [place] images were obtained standing. History of previous back surgery. EXAM: LUMBAR SPINE - COMPLETE 4+ VIEW COMPARISON:  Lumbar spine series of November 16, 2015 and MRI of the lumbar spine of July 05, 2016 FINDINGS: There is chronic curvature convex toward the left centered at L3-4 which appears stable allowing for the standing positioning. The vertebral bodies are preserved in height. There is moderate disc space narrowing at all lumbar levels. There is no significant spondylolisthesis. Endplate osteophytes are present at multiple levels. There is facet joint hypertrophy at L5-S1. The observed portions of the sacrum are normal. Is calcification in the wall of the abdominal aorta and iliac arteries. IMPRESSION: Moderate to severe disc space height loss at all lumbar levels. No acute compression fracture. Stable mild to moderate levocurvature centered at L3-4. If the patient's symptoms persist, repeat lumbar spine MRI would be useful. Electronically Signed   By: David  Martinique M.D.   On: 12/03/2016 14:42   Ct Head Wo  Contrast  Result Date: 12/09/2016 CLINICAL DATA:  Fever and altered level of consciousness. EXAM: CT HEAD WITHOUT CONTRAST TECHNIQUE: Contiguous axial images were obtained from the base of the skull through the vertex without intravenous contrast. COMPARISON:  None. FINDINGS: Brain: Mild cerebral atrophy. No ventricular dilatation. Low-attenuation changes throughout the deep white matter likely representing small vessel ischemia. No mass effect or midline shift. No abnormal extra-axial fluid collections. Gray-white matter junctions are distinct. Basal cisterns are not effaced. No evidence of acute intracranial hemorrhage. Vascular: No hyperdense vessel or unexpected calcification. Skull: Normal. Negative for fracture or focal lesion. Sinuses/Orbits: No acute finding. Other: None. IMPRESSION: No acute intracranial abnormalities. Electronically Signed   By: Oren Beckmann.D.  On: 12/09/2016 00:57   Mr Lumbar Spine W Wo Contrast  Result Date: 12/10/2016 CLINICAL DATA:  Low back pain. Chills and night sweats. Recent history of osteomyelitis of the jaw. Previous back surgery. EXAM: MRI LUMBAR SPINE WITHOUT AND WITH CONTRAST TECHNIQUE: Multiplanar and multiecho pulse sequences of the lumbar spine were obtained without and with intravenous contrast. CONTRAST:  15 cc MultiHance COMPARISON:  CT 12/05/2016 FINDINGS: Segmentation:  5 lumbar type vertebral bodies. Alignment: Curvature convex to the left with the apex at L3. Straightening the normal cervical lordosis. 2 mm anterolisthesis L4-5. Vertebrae: No fracture or primary bone lesion. Chronic discogenic marrow changes. Conus medullaris: Extends to the L1 level and appears normal. Paraspinal and other soft tissues: Negative except for renal cysts. Disc levels: No significant finding at T11-12 or T12-L1. L1-2: Endplate osteophytes and bulging of the disc. Mild narrowing of both lateral recesses without visible neural compression. L2-3: Endplate osteophytes and  bulging of the disc. Mild facet hypertrophy on the right. Mild narrowing of the lateral recesses right more than left. No definite neural compression. L3-4: Endplate osteophytes and bulging of the disc. Mild facet and ligamentous hypertrophy. Bilateral lateral recess stenosis that could possibly cause neural compression. L4-5: Previous right hemilaminectomy. 2 mm anterolisthesis because of facet osteoarthritis. Endplate osteophytes and minimal bulging of the disc. Mild stenosis of the left lateral recess. L5-S1: Left posterolateral to foraminal disc herniation that could compress the left L5 nerve root. Mild facet and ligamentous hypertrophy with narrowing of the subarticular lateral recesses left more than right. No finding to suggest spinal or paraspinal infection. IMPRESSION: No finding to suggest spinal or paraspinal infection. Curvature convex to the left with the apex at L3. Straightening of the normal lumbar lordosis. 2 mm anterolisthesis L4-5. Degenerative disc disease and degenerative facet disease. Narrowing of the lateral recesses could cause neural compression on the right at L2-3, bilaterally at L3-4 and on the left at L4-5. At L5-S1, there is a left foraminal disc herniation which would likely compress the left L5 nerve root. Mild narrowing of the subarticular lateral recess on the left. Electronically Signed   By: Nelson Chimes M.D.   On: 12/10/2016 13:55   Ct Abdomen Pelvis W Contrast  Result Date: 12/27/2016 CLINICAL DATA:  Continued weakness, leukocytosis, recently hospitalized for urosepsis, vomiting, fever, loss of appetite EXAM: CT ABDOMEN AND PELVIS WITH CONTRAST TECHNIQUE: Multidetector CT imaging of the abdomen and pelvis was performed using the standard protocol following bolus administration of intravenous contrast. Sagittal and coronal MPR images reconstructed from axial data set. CONTRAST:  76m ISOVUE-300 IOPAMIDOL (ISOVUE-300) INJECTION 61% IV. Dilute oral contrast. COMPARISON:   06/30/2014 FINDINGS: Lower chest: Lung bases clear Hepatobiliary: Multiple hepatic cysts. Liver otherwise unremarkable. Question mild gallbladder wall thickening. Mild edema is seen in the pericholecystic fat though this is also seen adjacent to the duodenal bulb, which appears thickened. Gallbladder does not appear to be the epicenter of this process. No definite calcified gallstones. Pancreas: Normal appearance Spleen: Normal appearance Adrenals/Urinary Tract: Adrenal glands normal appearance. Tiny LEFT renal cysts. Complex mass lateral aspect upper pole RIGHT kidney containing fluid, calcifications, question minimal fat, associated with cortical thinning, question cortical scar/infarct, unchanged. Kidneys, ureters, and bladder otherwise normal appearance. Stomach/Bowel: As noted above, duodenal bulb wall thickening and very duodenal infiltration of fat extending to the gallbladder, raising question of duodenal ulcer disease. Questionable collection of gas is seen within the duodenal wall versus redundant mucosa. Fat planes between the duodenum and pancreatic head appear ill defined  though pancreas is otherwise normal in appearance. Distal colonic diverticulosis without evidence of diverticulitis. Nonspecific mildly prominent small bowel loops in the LEFT mid abdomen without evidence of obstruction. Remaining bowel loops unremarkable. Moderate-sized hiatal hernia with remainder of stomach unremarkable. Vascular/Lymphatic: Atherosclerotic calcifications aorta without aneurysm. Additional atherosclerotic calcifications head coronary arteries, iliac arteries and the origins of the celiac artery, renal arteries, and SMA. Reproductive: Uterus surgically absent. Nonvisualization of ovaries. Other: No free air free fluid.  No definite hernia. Musculoskeletal: Bones demineralized with degenerative disc and facet disease changes lumbar spine. IMPRESSION: Duodenal wall thickening with very duodenal infiltrative changes  extending to the gallbladder and adjacent to the pancreatic head, raising question of duodenal ulcer disease. No definite extra intestinal gas is identified though a small focus of gas is likely present within the duodenal wall. Hepatic and renal cysts with complex cystic lesion with calcification at the upper pole of the RIGHT kidney, favor sequela of prior hemorrhage or infarct. Moderate-sized hiatal hernia. Distal colonic diverticulosis without evidence of diverticulitis. Aortic atherosclerosis and coronary arterial calcification as above. Electronically Signed   By: Lavonia Dana M.D.   On: 12/27/2016 16:12   Ct Maxillofacial W Contrast  Result Date: 12/27/2016 CLINICAL DATA:  Abnormal right mandible, concerning for osteomyelitis. History of dry socket in osteomyelitis related to recent dental extractions. EXAM: CT MAXILLOFACIAL WITH CONTRAST TECHNIQUE: MMultidetector CT imaging of the maxillofacial structures was performed with intravenous contrast. Multiplanar CT image reconstructions were also generated. A small metallic BB was placed on the right temple in order to reliably differentiate right from left. COMPARISON:  12/09/2016 FINDINGS: Osseous: Gas still seen within the right molar alveolus, with minimal fluid or debris. Semi-solid material and metallic density seen previously is no longer identified. No soft tissue abscess or inflammation noted. Intermittently seen gas between the tongue and floor of mouth without definite soft tissue emphysema. No bony sclerosis, erosion, or involucrum to suggest acute osteomyelitis. Orbits: Bilateral cataract resection.  No inflammation or mass. Sinuses: Negative.  No sinusitis or sinus obstruction. Soft tissues: No cellulitic changes noted. Prominent atherosclerotic calcification at the carotid bifurcations. Limited intracranial: Negative IMPRESSION: Persistent dry socket appearance of the right mandibular molar extraction site. No soft tissue abscess or detected  cellulitis. No bony changes of osteomyelitis. Electronically Signed   By: Monte Fantasia M.D.   On: 12/27/2016 14:24   Ct Maxillofacial W Contrast  Result Date: 12/09/2016 CLINICAL DATA:  Altered mental status worsening for 3 days. Fever, dental infection. Status post multiple dental extractions February and March. Status post biopsy demonstrating reported osteomyelitis. History of cancer and radiation. EXAM: CT MAXILLOFACIAL WITH CONTRAST TECHNIQUE: Multidetector CT imaging of the maxillofacial structures was performed. Multiplanar CT image reconstructions were also generated. A small metallic BB was placed on the right temple in order to reliably differentiate right from left. COMPARISON:  None. FINDINGS: OSSEOUS: The mandible is intact, the condyles are located. No acute facial fracture. No destructive bony lesions. Absent maxillary teeth. Residual maxillary teeth demonstrate dental caries. Severe RIGHT temporomandibular osteoarthrosis. Small metallic foreign body within RIGHT posterior mandible dental socket with packing material. ORBITS: Ocular globes and orbital contents are normal. SINUSES: Paranasal sinuses are well aerated. Nasal septum is midline. Trace LEFT mastoid effusion. SOFT TISSUES: No significant soft tissue swelling. No subcutaneous gas or radiopaque foreign bodies. Moderate calcific atherosclerosis of the carotid siphons. LIMITED INTRACRANIAL: Normal. IMPRESSION: Packing material and radiopaque foreign body within RIGHT mandible molar socket, recommend direct inspection. No CT findings of drainable fluid  collection or acute process in the face. Electronically Signed   By: Elon Alas M.D.   On: 12/09/2016 06:35   Dg Chest Port 1 View  Result Date: 12/08/2016 CLINICAL DATA:  Acute onset of lethargy and confusion. Recent urinary tract infection. Initial encounter. EXAM: PORTABLE CHEST 1 VIEW COMPARISON:  Chest radiograph performed 11/13/2016 FINDINGS: The lungs are well-aerated. Mild  bibasilar atelectasis or scarring is noted. Peribronchial thickening is seen. There is no evidence of pleural effusion or pneumothorax. The cardiomediastinal silhouette is borderline enlarged. No acute osseous abnormalities are seen. IMPRESSION: Mild bibasilar atelectasis or scarring noted. Peribronchial thickening seen. Borderline cardiomegaly. Electronically Signed   By: Garald Balding M.D.   On: 12/08/2016 23:38     TODAY-DAY OF DISCHARGE:  Subjective:   [name] today has no headache,no chest abdominal pain,no new weakness tingling or numbness, feels much better wants to go home today.   Objective:   Blood pressure (!) 149/65, pulse 95, temperature 98.8 F (37.1 C), resp. rate 18, height _0  (1.575 m), weight 62.3 kg (137 lb 6.4 oz), SpO2 93 %.  Intake/Output Summary (Last 24 hours) at 01/02/17 1242 Last data filed at 01/02/17 1042  Gross per 24 hour  Intake              220 ml  Output                0 ml  Net              220 ml   Filed Weights   12/27/16 1846  Weight: 62.3 kg (137 lb 6.4 oz)    Exam: Awake Alert, Oriented *3, No new F.N deficits, Normal affect .AT,PERRAL Supple Neck,No JVD, No cervical lymphadenopathy appriciated.  Symmetrical Chest wall movement, Good air movement bilaterally, CTAB RRR,No Gallops,Rubs or new Murmurs, No Parasternal Heave +ve B.Sounds, Abd Soft, Non tender, No organomegaly appriciated, No rebound -guarding or rigidity. No Cyanosis, Clubbing or edema, No new Rash or bruise   PERTINENT RADIOLOGIC STUDIES: Dg Chest 2 View  Result Date: 12/27/2016 CLINICAL DATA:  Fever EXAM: CHEST  2 VIEW COMPARISON:  12/09/2016 FINDINGS: Improved lung volume and improved aeration in the lung bases since prior study. Lungs are now clear without infiltrate or effusion. Negative for heart failure. Atherosclerotic aorta. IMPRESSION: No active cardiopulmonary disease. Electronically Signed   By: Franchot Gallo M.D.   On: 12/27/2016 12:48   Dg Lumbar  Spine Complete  Result Date: 12/03/2016 CLINICAL DATA:  Increased back pain radiating into the left leg after lifting heavy plantar 2 days ago. The patient is unable to lie supine for imaging and [place] images were obtained standing. History of previous back surgery. EXAM: LUMBAR SPINE - COMPLETE 4+ VIEW COMPARISON:  Lumbar spine series of November 16, 2015 and MRI of the lumbar spine of July 05, 2016 FINDINGS: There is chronic curvature convex toward the left centered at L3-4 which appears stable allowing for the standing positioning. The vertebral bodies are preserved in height. There is moderate disc space narrowing at all lumbar levels. There is no significant spondylolisthesis. Endplate osteophytes are present at multiple levels. There is facet joint hypertrophy at L5-S1. The observed portions of the sacrum are normal. Is calcification in the wall of the abdominal aorta and iliac arteries. IMPRESSION: Moderate to severe disc space height loss at all lumbar levels. No acute compression fracture. Stable mild to moderate levocurvature centered at L3-4. If the patient's symptoms persist, repeat lumbar spine MRI would be  useful. Electronically Signed   By: David  Martinique M.D.   On: 12/03/2016 14:42   Ct Head Wo Contrast  Result Date: 12/09/2016 CLINICAL DATA:  Fever and altered level of consciousness. EXAM: CT HEAD WITHOUT CONTRAST TECHNIQUE: Contiguous axial images were obtained from the base of the skull through the vertex without intravenous contrast. COMPARISON:  None. FINDINGS: Brain: Mild cerebral atrophy. No ventricular dilatation. Low-attenuation changes throughout the deep white matter likely representing small vessel ischemia. No mass effect or midline shift. No abnormal extra-axial fluid collections. Gray-white matter junctions are distinct. Basal cisterns are not effaced. No evidence of acute intracranial hemorrhage. Vascular: No hyperdense vessel or unexpected calcification. Skull: Normal. Negative for  fracture or focal lesion. Sinuses/Orbits: No acute finding. Other: None. IMPRESSION: No acute intracranial abnormalities. Electronically Signed   By: Lucienne Capers M.D.   On: 12/09/2016 00:57   Mr Lumbar Spine W Wo Contrast  Result Date: 12/10/2016 CLINICAL DATA:  Low back pain. Chills and night sweats. Recent history of osteomyelitis of the jaw. Previous back surgery. EXAM: MRI LUMBAR SPINE WITHOUT AND WITH CONTRAST TECHNIQUE: Multiplanar and multiecho pulse sequences of the lumbar spine were obtained without and with intravenous contrast. CONTRAST:  15 cc MultiHance COMPARISON:  CT 12/05/2016 FINDINGS: Segmentation:  5 lumbar type vertebral bodies. Alignment: Curvature convex to the left with the apex at L3. Straightening the normal cervical lordosis. 2 mm anterolisthesis L4-5. Vertebrae: No fracture or primary bone lesion. Chronic discogenic marrow changes. Conus medullaris: Extends to the L1 level and appears normal. Paraspinal and other soft tissues: Negative except for renal cysts. Disc levels: No significant finding at T11-12 or T12-L1. L1-2: Endplate osteophytes and bulging of the disc. Mild narrowing of both lateral recesses without visible neural compression. L2-3: Endplate osteophytes and bulging of the disc. Mild facet hypertrophy on the right. Mild narrowing of the lateral recesses right more than left. No definite neural compression. L3-4: Endplate osteophytes and bulging of the disc. Mild facet and ligamentous hypertrophy. Bilateral lateral recess stenosis that could possibly cause neural compression. L4-5: Previous right hemilaminectomy. 2 mm anterolisthesis because of facet osteoarthritis. Endplate osteophytes and minimal bulging of the disc. Mild stenosis of the left lateral recess. L5-S1: Left posterolateral to foraminal disc herniation that could compress the left L5 nerve root. Mild facet and ligamentous hypertrophy with narrowing of the subarticular lateral recesses left more than right.  No finding to suggest spinal or paraspinal infection. IMPRESSION: No finding to suggest spinal or paraspinal infection. Curvature convex to the left with the apex at L3. Straightening of the normal lumbar lordosis. 2 mm anterolisthesis L4-5. Degenerative disc disease and degenerative facet disease. Narrowing of the lateral recesses could cause neural compression on the right at L2-3, bilaterally at L3-4 and on the left at L4-5. At L5-S1, there is a left foraminal disc herniation which would likely compress the left L5 nerve root. Mild narrowing of the subarticular lateral recess on the left. Electronically Signed   By: Nelson Chimes M.D.   On: 12/10/2016 13:55   Ct Abdomen Pelvis W Contrast  Result Date: 12/27/2016 CLINICAL DATA:  Continued weakness, leukocytosis, recently hospitalized for urosepsis, vomiting, fever, loss of appetite EXAM: CT ABDOMEN AND PELVIS WITH CONTRAST TECHNIQUE: Multidetector CT imaging of the abdomen and pelvis was performed using the standard protocol following bolus administration of intravenous contrast. Sagittal and coronal MPR images reconstructed from axial data set. CONTRAST:  20m ISOVUE-300 IOPAMIDOL (ISOVUE-300) INJECTION 61% IV. Dilute oral contrast. COMPARISON:  06/30/2014 FINDINGS: Lower chest:  Lung bases clear Hepatobiliary: Multiple hepatic cysts. Liver otherwise unremarkable. Question mild gallbladder wall thickening. Mild edema is seen in the pericholecystic fat though this is also seen adjacent to the duodenal bulb, which appears thickened. Gallbladder does not appear to be the epicenter of this process. No definite calcified gallstones. Pancreas: Normal appearance Spleen: Normal appearance Adrenals/Urinary Tract: Adrenal glands normal appearance. Tiny LEFT renal cysts. Complex mass lateral aspect upper pole RIGHT kidney containing fluid, calcifications, question minimal fat, associated with cortical thinning, question cortical scar/infarct, unchanged. Kidneys, ureters,  and bladder otherwise normal appearance. Stomach/Bowel: As noted above, duodenal bulb wall thickening and very duodenal infiltration of fat extending to the gallbladder, raising question of duodenal ulcer disease. Questionable collection of gas is seen within the duodenal wall versus redundant mucosa. Fat planes between the duodenum and pancreatic head appear ill defined though pancreas is otherwise normal in appearance. Distal colonic diverticulosis without evidence of diverticulitis. Nonspecific mildly prominent small bowel loops in the LEFT mid abdomen without evidence of obstruction. Remaining bowel loops unremarkable. Moderate-sized hiatal hernia with remainder of stomach unremarkable. Vascular/Lymphatic: Atherosclerotic calcifications aorta without aneurysm. Additional atherosclerotic calcifications head coronary arteries, iliac arteries and the origins of the celiac artery, renal arteries, and SMA. Reproductive: Uterus surgically absent. Nonvisualization of ovaries. Other: No free air free fluid.  No definite hernia. Musculoskeletal: Bones demineralized with degenerative disc and facet disease changes lumbar spine. IMPRESSION: Duodenal wall thickening with very duodenal infiltrative changes extending to the gallbladder and adjacent to the pancreatic head, raising question of duodenal ulcer disease. No definite extra intestinal gas is identified though a small focus of gas is likely present within the duodenal wall. Hepatic and renal cysts with complex cystic lesion with calcification at the upper pole of the RIGHT kidney, favor sequela of prior hemorrhage or infarct. Moderate-sized hiatal hernia. Distal colonic diverticulosis without evidence of diverticulitis. Aortic atherosclerosis and coronary arterial calcification as above. Electronically Signed   By: Lavonia Dana M.D.   On: 12/27/2016 16:12   Ct Maxillofacial W Contrast  Result Date: 12/27/2016 CLINICAL DATA:  Abnormal right mandible, concerning for  osteomyelitis. History of dry socket in osteomyelitis related to recent dental extractions. EXAM: CT MAXILLOFACIAL WITH CONTRAST TECHNIQUE: MMultidetector CT imaging of the maxillofacial structures was performed with intravenous contrast. Multiplanar CT image reconstructions were also generated. A small metallic BB was placed on the right temple in order to reliably differentiate right from left. COMPARISON:  12/09/2016 FINDINGS: Osseous: Gas still seen within the right molar alveolus, with minimal fluid or debris. Semi-solid material and metallic density seen previously is no longer identified. No soft tissue abscess or inflammation noted. Intermittently seen gas between the tongue and floor of mouth without definite soft tissue emphysema. No bony sclerosis, erosion, or involucrum to suggest acute osteomyelitis. Orbits: Bilateral cataract resection.  No inflammation or mass. Sinuses: Negative.  No sinusitis or sinus obstruction. Soft tissues: No cellulitic changes noted. Prominent atherosclerotic calcification at the carotid bifurcations. Limited intracranial: Negative IMPRESSION: Persistent dry socket appearance of the right mandibular molar extraction site. No soft tissue abscess or detected cellulitis. No bony changes of osteomyelitis. Electronically Signed   By: Monte Fantasia M.D.   On: 12/27/2016 14:24   Ct Maxillofacial W Contrast  Result Date: 12/09/2016 CLINICAL DATA:  Altered mental status worsening for 3 days. Fever, dental infection. Status post multiple dental extractions February and March. Status post biopsy demonstrating reported osteomyelitis. History of cancer and radiation. EXAM: CT MAXILLOFACIAL WITH CONTRAST TECHNIQUE: Multidetector CT imaging of  the maxillofacial structures was performed. Multiplanar CT image reconstructions were also generated. A small metallic BB was placed on the right temple in order to reliably differentiate right from left. COMPARISON:  None. FINDINGS: OSSEOUS: The  mandible is intact, the condyles are located. No acute facial fracture. No destructive bony lesions. Absent maxillary teeth. Residual maxillary teeth demonstrate dental caries. Severe RIGHT temporomandibular osteoarthrosis. Small metallic foreign body within RIGHT posterior mandible dental socket with packing material. ORBITS: Ocular globes and orbital contents are normal. SINUSES: Paranasal sinuses are well aerated. Nasal septum is midline. Trace LEFT mastoid effusion. SOFT TISSUES: No significant soft tissue swelling. No subcutaneous gas or radiopaque foreign bodies. Moderate calcific atherosclerosis of the carotid siphons. LIMITED INTRACRANIAL: Normal. IMPRESSION: Packing material and radiopaque foreign body within RIGHT mandible molar socket, recommend direct inspection. No CT findings of drainable fluid collection or acute process in the face. Electronically Signed   By: Elon Alas M.D.   On: 12/09/2016 06:35   Dg Chest Port 1 View  Result Date: 12/08/2016 CLINICAL DATA:  Acute onset of lethargy and confusion. Recent urinary tract infection. Initial encounter. EXAM: PORTABLE CHEST 1 VIEW COMPARISON:  Chest radiograph performed 11/13/2016 FINDINGS: The lungs are well-aerated. Mild bibasilar atelectasis or scarring is noted. Peribronchial thickening is seen. There is no evidence of pleural effusion or pneumothorax. The cardiomediastinal silhouette is borderline enlarged. No acute osseous abnormalities are seen. IMPRESSION: Mild bibasilar atelectasis or scarring noted. Peribronchial thickening seen. Borderline cardiomegaly. Electronically Signed   By: Garald Balding M.D.   On: 12/08/2016 23:38     PERTINENT LAB RESULTS: CBC:  Recent Labs  01/01/17 0710 01/02/17 0515  WBC 8.2 11.0*  HGB 8.9* 8.7*  HCT 27.9* 28.0*  PLT 314 360   CMET CMP     Component Value Date/Time   NA 138 01/02/2017 0706   K 4.1 01/02/2017 0706   CL 106 01/02/2017 0706   CO2 24 01/02/2017 0706   GLUCOSE 117 (H)  01/02/2017 0706   BUN 7 01/02/2017 0706   CREATININE 0.85 01/02/2017 0706   CALCIUM 8.5 (L) 01/02/2017 0706   PROT 7.0 12/28/2016 0408   ALBUMIN 2.6 (L) 12/28/2016 0408   AST 23 12/28/2016 0408   ALT 16 12/28/2016 0408   ALKPHOS 54 12/28/2016 0408   BILITOT 0.3 12/28/2016 0408   GFRNONAA >60 01/02/2017 0706   GFRAA >60 01/02/2017 0706    GFR Estimated Creatinine Clearance: 43.5 mL/min (by C-G formula based on SCr of 0.85 mg/dL). No results for input(s): LIPASE, AMYLASE in the last 72 hours. No results for input(s): CKTOTAL, CKMB, CKMBINDEX, TROPONINI in the last 72 hours. Invalid input(s): POCBNP No results for input(s): DDIMER in the last 72 hours. No results for input(s): HGBA1C in the last 72 hours. No results for input(s): CHOL, HDL, LDLCALC, TRIG, CHOLHDL, LDLDIRECT in the last 72 hours. No results for input(s): TSH, T4TOTAL, T3FREE, THYROIDAB in the last 72 hours.  Invalid input(s): FREET3 No results for input(s): VITAMINB12, FOLATE, FERRITIN, TIBC, IRON, RETICCTPCT in the last 72 hours. Coags: No results for input(s): INR in the last 72 hours.  Invalid input(s): PT Microbiology: Recent Results (from the past 240 hour(s))  Blood Culture (routine x 2)     Status: None   Collection Time: 12/27/16 12:08 PM  Result Value Ref Range Status   Specimen Description BLOOD RIGHT ANTECUBITAL  Final   Special Requests   Final    BOTTLES DRAWN AEROBIC AND ANAEROBIC Blood Culture adequate volume   Culture  NO GROWTH 5 DAYS  Final   Report Status 01/01/2017 FINAL  Final  Blood Culture (routine x 2)     Status: None   Collection Time: 12/27/16  1:16 PM  Result Value Ref Range Status   Specimen Description BLOOD LEFT ANTECUBITAL  Final   Special Requests   Final    BOTTLES DRAWN AEROBIC AND ANAEROBIC Blood Culture adequate volume   Culture NO GROWTH 5 DAYS  Final   Report Status 01/01/2017 FINAL  Final    FURTHER DISCHARGE INSTRUCTIONS:  Get Medicines reviewed and  adjusted: Please take all your medications with you for your next visit with your Primary MD  Laboratory/radiological data: Please request your Primary MD to go over all hospital tests and procedure/radiological results at the follow up, please ask your Primary MD to get all Hospital records sent to his/her office.  In some cases, they will be blood work, cultures and biopsy results pending at the time of your discharge. Please request that your primary care M.D. goes through all the records of your hospital data and follows up on these results.  Also Note the following: If you experience worsening of your admission symptoms, develop shortness of breath, life threatening emergency, suicidal or homicidal thoughts you must seek medical attention immediately by calling 911 or calling your MD immediately  if symptoms less severe.  You must read complete instructions/literature along with all the possible adverse reactions/side effects for all the Medicines you take and that have been prescribed to you. Take any new Medicines after you have completely understood and accpet all the possible adverse reactions/side effects.   Do not drive when taking Pain medications or sleeping medications (Benzodaizepines)  Do not take more than prescribed Pain, Sleep and Anxiety Medications. It is not advisable to combine anxiety,sleep and pain medications without talking with your primary care practitioner  Special Instructions: If you have smoked or chewed Tobacco  in the last 2 yrs please stop smoking, stop any regular Alcohol  and or any Recreational drug use.  Wear Seat belts while driving.  Please note: You were cared for by a hospitalist during your hospital stay. Once you are discharged, your primary care physician will handle any further medical issues. Please note that NO REFILLS for any discharge medications will be authorized once you are discharged, as it is imperative that you return to your primary  care physician (or establish a relationship with a primary care physician if you do not have one) for your post hospital discharge needs so that they can reassess your need for medications and monitor your lab values.  Total Time spent coordinating discharge including counseling, education and face to face time equals 45 minutes.  SignedOren Binet 01/02/2017 12:42 PM

## 2017-01-02 NOTE — Progress Notes (Signed)
PT Cancellation Note  Patient Details Name: [name] MRN: [id number] DOB: 1932/07/17   Cancelled Treatment:    Reason Eval/Treat Not Completed: PT screened, no needs identified, will sign off; patient relates nothing changed since PT evaluation on 12/28/16.  Reports walking to bathroom no device.  Will sign off as no change since previous eval and planned for resume HHPT at d/c.    Reginia Naas 01/02/2017, 9:24 AM  Magda Kiel, [place] 01/02/2017

## 2017-01-02 NOTE — Progress Notes (Signed)
Arrive to place the PICC, the nurse is at bedside. The patient states she does not want the PICC now because she wants to eat and get a bath. Pt expressed frustration because she is not aware "of what's going on". I explained to both the patient and the nurse that this will delay her discharge from our facility and the PICC team may not be able to place the PICC today. The patient states "well I'm sorry, I'm going to eat". I suggested to the nurse to contact IR for possible PICC placement today. [employer]

## 2017-01-02 NOTE — Procedures (Signed)
RUE PICC SVC RA EBL 0 Comp 0

## 2017-01-03 ENCOUNTER — Encounter (HOSPITAL_COMMUNITY): Payer: Self-pay | Admitting: Interventional Radiology

## 2017-01-03 DIAGNOSIS — E1122 Type 2 diabetes mellitus with diabetic chronic kidney disease: Secondary | ICD-10-CM | POA: Diagnosis not present

## 2017-01-03 DIAGNOSIS — Z7984 Long term (current) use of oral hypoglycemic drugs: Secondary | ICD-10-CM | POA: Diagnosis not present

## 2017-01-03 DIAGNOSIS — M272 Inflammatory conditions of jaws: Secondary | ICD-10-CM | POA: Diagnosis not present

## 2017-01-03 DIAGNOSIS — N183 Chronic kidney disease, stage 3 (moderate): Secondary | ICD-10-CM | POA: Diagnosis not present

## 2017-01-03 DIAGNOSIS — M84453A Pathological fracture, unspecified femur, initial encounter for fracture: Secondary | ICD-10-CM | POA: Diagnosis not present

## 2017-01-03 DIAGNOSIS — M5136 Other intervertebral disc degeneration, lumbar region: Secondary | ICD-10-CM | POA: Diagnosis not present

## 2017-01-03 DIAGNOSIS — I129 Hypertensive chronic kidney disease with stage 1 through stage 4 chronic kidney disease, or unspecified chronic kidney disease: Secondary | ICD-10-CM | POA: Diagnosis not present

## 2017-01-03 DIAGNOSIS — Z8673 Personal history of transient ischemic attack (TIA), and cerebral infarction without residual deficits: Secondary | ICD-10-CM | POA: Diagnosis not present

## 2017-01-03 DIAGNOSIS — A419 Sepsis, unspecified organism: Secondary | ICD-10-CM | POA: Diagnosis not present

## 2017-01-03 DIAGNOSIS — D649 Anemia, unspecified: Secondary | ICD-10-CM | POA: Diagnosis not present

## 2017-01-06 DIAGNOSIS — A419 Sepsis, unspecified organism: Secondary | ICD-10-CM | POA: Diagnosis not present

## 2017-01-06 DIAGNOSIS — M272 Inflammatory conditions of jaws: Secondary | ICD-10-CM | POA: Diagnosis not present

## 2017-01-07 DIAGNOSIS — M272 Inflammatory conditions of jaws: Secondary | ICD-10-CM | POA: Diagnosis not present

## 2017-01-09 DIAGNOSIS — Z7984 Long term (current) use of oral hypoglycemic drugs: Secondary | ICD-10-CM | POA: Diagnosis not present

## 2017-01-09 DIAGNOSIS — M5136 Other intervertebral disc degeneration, lumbar region: Secondary | ICD-10-CM | POA: Diagnosis not present

## 2017-01-09 DIAGNOSIS — E119 Type 2 diabetes mellitus without complications: Secondary | ICD-10-CM | POA: Diagnosis not present

## 2017-01-09 DIAGNOSIS — M272 Inflammatory conditions of jaws: Secondary | ICD-10-CM | POA: Diagnosis not present

## 2017-01-09 DIAGNOSIS — Z8673 Personal history of transient ischemic attack (TIA), and cerebral infarction without residual deficits: Secondary | ICD-10-CM | POA: Diagnosis not present

## 2017-01-09 DIAGNOSIS — A419 Sepsis, unspecified organism: Secondary | ICD-10-CM | POA: Diagnosis not present

## 2017-01-09 DIAGNOSIS — I129 Hypertensive chronic kidney disease with stage 1 through stage 4 chronic kidney disease, or unspecified chronic kidney disease: Secondary | ICD-10-CM | POA: Diagnosis not present

## 2017-01-09 DIAGNOSIS — N183 Chronic kidney disease, stage 3 (moderate): Secondary | ICD-10-CM | POA: Diagnosis not present

## 2017-01-09 DIAGNOSIS — E875 Hyperkalemia: Secondary | ICD-10-CM | POA: Diagnosis not present

## 2017-01-09 DIAGNOSIS — E1122 Type 2 diabetes mellitus with diabetic chronic kidney disease: Secondary | ICD-10-CM | POA: Diagnosis not present

## 2017-01-09 DIAGNOSIS — D649 Anemia, unspecified: Secondary | ICD-10-CM | POA: Diagnosis not present

## 2017-01-09 DIAGNOSIS — K264 Chronic or unspecified duodenal ulcer with hemorrhage: Secondary | ICD-10-CM | POA: Diagnosis not present

## 2017-01-10 DIAGNOSIS — M272 Inflammatory conditions of jaws: Secondary | ICD-10-CM | POA: Diagnosis not present

## 2017-01-12 DIAGNOSIS — M84453A Pathological fracture, unspecified femur, initial encounter for fracture: Secondary | ICD-10-CM | POA: Diagnosis not present

## 2017-01-12 DIAGNOSIS — A419 Sepsis, unspecified organism: Secondary | ICD-10-CM | POA: Diagnosis not present

## 2017-01-13 DIAGNOSIS — M272 Inflammatory conditions of jaws: Secondary | ICD-10-CM | POA: Diagnosis not present

## 2017-01-13 DIAGNOSIS — A419 Sepsis, unspecified organism: Secondary | ICD-10-CM | POA: Diagnosis not present

## 2017-01-14 DIAGNOSIS — M272 Inflammatory conditions of jaws: Secondary | ICD-10-CM | POA: Diagnosis not present

## 2017-01-15 DIAGNOSIS — M272 Inflammatory conditions of jaws: Secondary | ICD-10-CM | POA: Diagnosis not present

## 2017-01-16 DIAGNOSIS — M272 Inflammatory conditions of jaws: Secondary | ICD-10-CM | POA: Diagnosis not present

## 2017-01-17 DIAGNOSIS — M272 Inflammatory conditions of jaws: Secondary | ICD-10-CM | POA: Diagnosis not present

## 2017-01-20 DIAGNOSIS — A419 Sepsis, unspecified organism: Secondary | ICD-10-CM | POA: Diagnosis not present

## 2017-01-20 DIAGNOSIS — M272 Inflammatory conditions of jaws: Secondary | ICD-10-CM | POA: Diagnosis not present

## 2017-01-22 DIAGNOSIS — M272 Inflammatory conditions of jaws: Secondary | ICD-10-CM | POA: Diagnosis not present

## 2017-01-23 DIAGNOSIS — M272 Inflammatory conditions of jaws: Secondary | ICD-10-CM | POA: Diagnosis not present

## 2017-01-24 DIAGNOSIS — M272 Inflammatory conditions of jaws: Secondary | ICD-10-CM | POA: Diagnosis not present

## 2017-01-24 DIAGNOSIS — A419 Sepsis, unspecified organism: Secondary | ICD-10-CM | POA: Diagnosis not present

## 2017-01-24 DIAGNOSIS — M84453A Pathological fracture, unspecified femur, initial encounter for fracture: Secondary | ICD-10-CM | POA: Diagnosis not present

## 2017-01-27 DIAGNOSIS — M272 Inflammatory conditions of jaws: Secondary | ICD-10-CM | POA: Diagnosis not present

## 2017-01-27 DIAGNOSIS — A419 Sepsis, unspecified organism: Secondary | ICD-10-CM | POA: Diagnosis not present

## 2017-01-28 DIAGNOSIS — M272 Inflammatory conditions of jaws: Secondary | ICD-10-CM | POA: Diagnosis not present

## 2017-01-29 DIAGNOSIS — M272 Inflammatory conditions of jaws: Secondary | ICD-10-CM | POA: Diagnosis not present

## 2017-01-30 DIAGNOSIS — M272 Inflammatory conditions of jaws: Secondary | ICD-10-CM | POA: Diagnosis not present

## 2017-02-03 DIAGNOSIS — M272 Inflammatory conditions of jaws: Secondary | ICD-10-CM | POA: Diagnosis not present

## 2017-02-03 DIAGNOSIS — A419 Sepsis, unspecified organism: Secondary | ICD-10-CM | POA: Diagnosis not present

## 2017-02-04 DIAGNOSIS — M272 Inflammatory conditions of jaws: Secondary | ICD-10-CM | POA: Diagnosis not present

## 2017-02-06 ENCOUNTER — Encounter: Payer: Self-pay | Admitting: Internal Medicine

## 2017-02-06 ENCOUNTER — Ambulatory Visit (INDEPENDENT_AMBULATORY_CARE_PROVIDER_SITE_OTHER): Payer: Medicare Other | Admitting: Internal Medicine

## 2017-02-06 VITALS — BP 124/74 | HR 68 | Temp 98.0°F | Wt 144.8 lb

## 2017-02-06 DIAGNOSIS — M272 Inflammatory conditions of jaws: Secondary | ICD-10-CM

## 2017-02-06 NOTE — Progress Notes (Signed)
Patient ID: [name], female   DOB: 08/12/1931, 83 y.o.   MRN: [id number]  HPI  83yo F who has history of poor dentition who had right lower molar extraction with poor wound healing and developed osteo of jaw, right lower jaw. She was hospitalized in late may for osteo and was started on unasyn. Sed rate > 120 but now in the 80s. Has finished her 6 wk course but off for 1 week of abtx but has keep flushing her picc line. She seeds dr Willette Cluster who feels she is making improvement  Dr. Charlann Lange - oral surgeon in [facility] Outpatient Encounter Prescriptions as of 02/06/2017  Medication Sig  . amLODipine (NORVASC) 5 MG tablet Take 5 mg by mouth daily.  Marland Kitchen atorvastatin (LIPITOR) 40 MG tablet Take 40 mg by mouth daily.  . baclofen (LIORESAL) 10 MG tablet Take 10 mg by mouth 2 (two) times daily as needed for muscle spasms.  . carvedilol (COREG) 25 MG tablet Take 25 mg by mouth 2 (two) times daily with a meal.  . cyclobenzaprine (FLEXERIL) 10 MG tablet Take 1 tablet (10 mg total) by mouth 3 (three) times daily as needed for muscle spasms.  Marland Kitchen gabapentin (NEURONTIN) 300 MG capsule Take 300 mg by mouth 3 (three) times daily.  Marland Kitchen HYDROcodone-acetaminophen (NORCO/VICODIN) 5-325 MG tablet Take 1 tablet by mouth every 4 (four) hours as needed for moderate pain.  Marland Kitchen losartan-hydrochlorothiazide (HYZAAR) 100-25 MG per tablet Take 1 tablet by mouth daily.  . metFORMIN (GLUCOPHAGE-XR) 500 MG 24 hr tablet Take 500 mg by mouth daily with breakfast.  . mirtazapine (REMERON) 30 MG tablet Take 30 mg by mouth at bedtime.  . pantoprazole (PROTONIX) 40 MG tablet Take 1 tablet (40 mg total) by mouth 2 (two) times daily.  . protein supplement shake (PREMIER PROTEIN) LIQD Take 325 mLs (11 oz total) by mouth 2 (two) times daily between meals.   No facility-administered encounter medications on file as of 02/06/2017.      Patient Active Problem List   Diagnosis Date Noted  . Abnormal CT scan,  gastrointestinal tract 12/31/2016  . GI bleed 12/31/2016  . HLD (hyperlipidemia) 12/09/2016  . GERD (gastroesophageal reflux disease) 12/09/2016  . Depression 12/09/2016  . History of CVA (cerebrovascular accident) 12/09/2016  . CKD (chronic kidney disease), stage III 12/09/2016  . Back pain 12/09/2016  . Sepsis ([place]) 12/09/2016  . History of oral surgery 12/09/2016  . Osteomyelitis of jaw 12/09/2016  . Diabetes mellitus type 2, uncontrolled ([place]) 12/09/2016  . History of Lumbar herniated disc 08/09/2014  . UTI (lower urinary tract infection) 04/08/2012  . Pneumonia 04/04/2012  . Acute blood loss anemia 04/01/2012  . Fall at home 03/31/2012  . History of shingles 03/31/2012  . Anemia 03/31/2012  . Femur fracture, left ([place]) 03/31/2012  . Hypertension 03/30/2012     Health Maintenance Due  Topic Date Due  . HEMOGLOBIN A1C  1932/01/10  . FOOT EXAM  12/08/1943  . OPHTHALMOLOGY EXAM  12/08/1943  . TETANUS/TDAP  12/07/1952  . DEXA SCAN  12/08/1998  . PNA vac Low Risk Adult (1 of 2 - PCV13) 12/08/1998     Review of Systems Poor dentition. 12 point ros is negative Physical Exam   BP 124/74 (BP Location: Left Arm, Patient Position: Sitting, Cuff Size: Normal)   Pulse 68   Temp 98 F (36.7 C) (Oral)   Wt 144 lb 12.8 oz (65.7 kg)   SpO2 94%   BMI 26.48  kg/m    Physical Exam  Constitutional:  oriented to person, place, and time. appears well-developed and well-nourished. No distress.  HENT: [place]/AT, PERRLA, no scleral icterus Mouth/Throat: Oropharynx is clear and moist. No oropharyngeal exudate. Right lower jaw stil has smaller but open ulcer on gum line where 2nd molar would be. Has poor dentition. Decay 4 bottow left side of jaw Skin: Skin is warm and dry. No rash noted. No erythema. picc line is c/d/i Psychiatric: a normal mood and affect.  behavior is normal.   CBC Lab Results  Component Value Date   WBC 11.0 (H) 01/02/2017   RBC 3.09 (L) 01/02/2017   HGB 8.7 (L)  01/02/2017   HCT 28.0 (L) 01/02/2017   PLT 360 01/02/2017   MCV 90.6 01/02/2017   MCH 28.2 01/02/2017   MCHC 31.1 01/02/2017   RDW 14.5 01/02/2017   LYMPHSABS 2.5 12/30/2016   MONOABS 0.7 12/30/2016   EOSABS 0.6 12/30/2016    BMET Lab Results  Component Value Date   NA 138 01/02/2017   K 4.1 01/02/2017   CL 106 01/02/2017   CO2 24 01/02/2017   GLUCOSE 117 (H) 01/02/2017   BUN 7 01/02/2017   CREATININE 0.85 01/02/2017   CALCIUM 8.5 (L) 01/02/2017   GFRNONAA >60 01/02/2017   GFRAA >60 01/02/2017      Assessment and Plan   Jaw osteo = will switch regimen since not sufficient improvement after 6 wk of IV abtx. We will do 2 wk of cefotetan 2gm Q 12 hr to see if any improvement. Will check sed rate and crp  I have spent 30 min with patient and coordination of care with [facility]. For abtx mgmt of jaw osteo

## 2017-02-07 DIAGNOSIS — A419 Sepsis, unspecified organism: Secondary | ICD-10-CM | POA: Diagnosis not present

## 2017-02-07 DIAGNOSIS — M272 Inflammatory conditions of jaws: Secondary | ICD-10-CM | POA: Diagnosis not present

## 2017-02-07 DIAGNOSIS — M84453A Pathological fracture, unspecified femur, initial encounter for fracture: Secondary | ICD-10-CM | POA: Diagnosis not present

## 2017-02-07 LAB — SEDIMENTATION RATE: Sed Rate: 94 mm/hr — ABNORMAL HIGH (ref 0–30)

## 2017-02-07 LAB — C-REACTIVE PROTEIN: CRP: 26.8 mg/L — ABNORMAL HIGH (ref <8.0)

## 2017-02-10 DIAGNOSIS — E1122 Type 2 diabetes mellitus with diabetic chronic kidney disease: Secondary | ICD-10-CM | POA: Diagnosis not present

## 2017-02-10 DIAGNOSIS — S32512A Fracture of superior rim of left pubis, initial encounter for closed fracture: Secondary | ICD-10-CM | POA: Diagnosis not present

## 2017-02-10 DIAGNOSIS — Z7689 Persons encountering health services in other specified circumstances: Secondary | ICD-10-CM | POA: Diagnosis not present

## 2017-02-10 DIAGNOSIS — I129 Hypertensive chronic kidney disease with stage 1 through stage 4 chronic kidney disease, or unspecified chronic kidney disease: Secondary | ICD-10-CM | POA: Diagnosis not present

## 2017-02-10 DIAGNOSIS — S32591A Other specified fracture of right pubis, initial encounter for closed fracture: Secondary | ICD-10-CM | POA: Diagnosis not present

## 2017-02-10 DIAGNOSIS — I10 Essential (primary) hypertension: Secondary | ICD-10-CM | POA: Diagnosis not present

## 2017-02-10 DIAGNOSIS — M5136 Other intervertebral disc degeneration, lumbar region: Secondary | ICD-10-CM | POA: Diagnosis not present

## 2017-02-10 DIAGNOSIS — S32592A Other specified fracture of left pubis, initial encounter for closed fracture: Secondary | ICD-10-CM | POA: Diagnosis not present

## 2017-02-10 DIAGNOSIS — Z8673 Personal history of transient ischemic attack (TIA), and cerebral infarction without residual deficits: Secondary | ICD-10-CM | POA: Diagnosis not present

## 2017-02-10 DIAGNOSIS — M272 Inflammatory conditions of jaws: Secondary | ICD-10-CM | POA: Diagnosis not present

## 2017-02-10 DIAGNOSIS — Z7984 Long term (current) use of oral hypoglycemic drugs: Secondary | ICD-10-CM | POA: Diagnosis not present

## 2017-02-10 DIAGNOSIS — D649 Anemia, unspecified: Secondary | ICD-10-CM | POA: Diagnosis not present

## 2017-02-10 DIAGNOSIS — N183 Chronic kidney disease, stage 3 (moderate): Secondary | ICD-10-CM | POA: Diagnosis not present

## 2017-02-10 DIAGNOSIS — Z79899 Other long term (current) drug therapy: Secondary | ICD-10-CM | POA: Diagnosis not present

## 2017-02-10 DIAGNOSIS — A419 Sepsis, unspecified organism: Secondary | ICD-10-CM | POA: Diagnosis not present

## 2017-02-11 DIAGNOSIS — M272 Inflammatory conditions of jaws: Secondary | ICD-10-CM | POA: Diagnosis not present

## 2017-02-12 DIAGNOSIS — M272 Inflammatory conditions of jaws: Secondary | ICD-10-CM | POA: Diagnosis not present

## 2017-02-13 ENCOUNTER — Telehealth: Payer: Self-pay | Admitting: *Deleted

## 2017-02-13 DIAGNOSIS — M272 Inflammatory conditions of jaws: Secondary | ICD-10-CM | POA: Diagnosis not present

## 2017-02-13 MED ORDER — AMOXICILLIN-POT CLAVULANATE 875-125 MG PO TABS: 1.0000 | ORAL_TABLET | Freq: Two times a day (BID) | ORAL | 0 refills | Status: DC

## 2017-02-13 NOTE — Telephone Encounter (Signed)
Hi travis, Let's place her on amox/clav 875 BID x the # of days that her family is unable to give her IV abtx, then resume back to getting the IV abtx when they return

## 2017-02-13 NOTE — Telephone Encounter (Signed)
Called [name] and she was notified that the medication was called in to the pharmacy and to restart as soon as they come back. Reminded her to flush the line while they are gone.

## 2017-02-13 NOTE — Addendum Note (Signed)
Addended by: Reggy Eye on: 02/13/2017 05:12 PM   Modules accepted: Orders

## 2017-02-13 NOTE — Telephone Encounter (Signed)
Patient daughter called to advise that both daughters are leaving town 02/17/17-02/24/17 and they are the ones that give the patient her IV medication and flushes. They are concerned and would like to know if the patient can be placed on oral medication for the week so she does not miss doses. They will train someone to come flush the line daily while they are gone but no one can come twice daily to do the infusions. Advised will have to ask the provider and get back to them.

## 2017-02-14 DIAGNOSIS — M272 Inflammatory conditions of jaws: Secondary | ICD-10-CM | POA: Diagnosis not present

## 2017-02-17 ENCOUNTER — Encounter: Payer: Self-pay | Admitting: Internal Medicine

## 2017-02-17 DIAGNOSIS — M272 Inflammatory conditions of jaws: Secondary | ICD-10-CM | POA: Diagnosis not present

## 2017-02-17 DIAGNOSIS — A419 Sepsis, unspecified organism: Secondary | ICD-10-CM | POA: Diagnosis not present

## 2017-02-18 DIAGNOSIS — M272 Inflammatory conditions of jaws: Secondary | ICD-10-CM | POA: Diagnosis not present

## 2017-02-19 DIAGNOSIS — M272 Inflammatory conditions of jaws: Secondary | ICD-10-CM | POA: Diagnosis not present

## 2017-02-21 DIAGNOSIS — M272 Inflammatory conditions of jaws: Secondary | ICD-10-CM | POA: Diagnosis not present

## 2017-02-24 ENCOUNTER — Telehealth: Payer: Self-pay | Admitting: *Deleted

## 2017-02-24 DIAGNOSIS — M272 Inflammatory conditions of jaws: Secondary | ICD-10-CM | POA: Diagnosis not present

## 2017-02-24 DIAGNOSIS — A419 Sepsis, unspecified organism: Secondary | ICD-10-CM | POA: Diagnosis not present

## 2017-02-24 NOTE — Telephone Encounter (Signed)
Sherry the RN taking care of the patient from [facility] called to advise that the patient daughters are back and she has resumed PICC medication. Labs were drawn today 02/24/17 and she wants to know if we want the IV extended or is it ok to stop on 03/03/17 the original date. Advised will ask the doctor and give them a call back.

## 2017-02-25 ENCOUNTER — Other Ambulatory Visit: Payer: Self-pay | Admitting: Pharmacist

## 2017-02-25 DIAGNOSIS — M272 Inflammatory conditions of jaws: Secondary | ICD-10-CM | POA: Diagnosis not present

## 2017-02-27 DIAGNOSIS — M272 Inflammatory conditions of jaws: Secondary | ICD-10-CM | POA: Diagnosis not present

## 2017-02-27 DIAGNOSIS — S32599A Other specified fracture of unspecified pubis, initial encounter for closed fracture: Secondary | ICD-10-CM | POA: Diagnosis not present

## 2017-02-27 NOTE — Telephone Encounter (Signed)
Nurse [name] has called again to clarify the end date.

## 2017-02-28 ENCOUNTER — Telehealth: Payer: Self-pay | Admitting: Internal Medicine

## 2017-02-28 DIAGNOSIS — M272 Inflammatory conditions of jaws: Secondary | ICD-10-CM | POA: Diagnosis not present

## 2017-02-28 NOTE — Telephone Encounter (Signed)
Spoke with mary at [facility] - abtx will end on 8/3, then bridge to oral abtx

## 2017-02-28 NOTE — Telephone Encounter (Signed)
I spoke with [facility] and will do end date of 7/30, pull picc line and switch to oral abtx

## 2017-03-02 DIAGNOSIS — M84453A Pathological fracture, unspecified femur, initial encounter for fracture: Secondary | ICD-10-CM | POA: Diagnosis not present

## 2017-03-02 DIAGNOSIS — A419 Sepsis, unspecified organism: Secondary | ICD-10-CM | POA: Diagnosis not present

## 2017-03-03 DIAGNOSIS — A419 Sepsis, unspecified organism: Secondary | ICD-10-CM | POA: Diagnosis not present

## 2017-03-03 DIAGNOSIS — M272 Inflammatory conditions of jaws: Secondary | ICD-10-CM | POA: Diagnosis not present

## 2017-03-03 NOTE — Telephone Encounter (Signed)
Patient's daughter asked which oral antibiotic patient will be switched to? She uses [facility]. Landis Gandy, RN

## 2017-03-04 DIAGNOSIS — M272 Inflammatory conditions of jaws: Secondary | ICD-10-CM | POA: Diagnosis not present

## 2017-03-05 ENCOUNTER — Other Ambulatory Visit: Payer: Self-pay | Admitting: Pharmacist

## 2017-03-05 DIAGNOSIS — M272 Inflammatory conditions of jaws: Secondary | ICD-10-CM | POA: Diagnosis not present

## 2017-03-05 MED ORDER — AMOXICILLIN-POT CLAVULANATE 875-125 MG PO TABS: 1.0000 | ORAL_TABLET | Freq: Two times a day (BID) | ORAL | 0 refills | Status: DC

## 2017-03-05 NOTE — Telephone Encounter (Signed)
Can you put in order for amox/clav 834m bid x 30d no refill

## 2017-03-05 NOTE — Addendum Note (Signed)
Addended by: Landis Gandy on: 03/05/2017 03:33 PM   Modules accepted: Orders

## 2017-03-05 NOTE — Telephone Encounter (Addendum)
Augmentin 875-125 1 tablet twice daily #60 sent to [facility] per verbal order. Left message with patient's sister notifying her that the antibiotic was sent to the pharmacy.

## 2017-03-06 DIAGNOSIS — S32511A Fracture of superior rim of right pubis, initial encounter for closed fracture: Secondary | ICD-10-CM | POA: Diagnosis not present

## 2017-03-06 DIAGNOSIS — S32592A Other specified fracture of left pubis, initial encounter for closed fracture: Secondary | ICD-10-CM | POA: Diagnosis not present

## 2017-03-06 DIAGNOSIS — M272 Inflammatory conditions of jaws: Secondary | ICD-10-CM | POA: Diagnosis not present

## 2017-03-07 DIAGNOSIS — M272 Inflammatory conditions of jaws: Secondary | ICD-10-CM | POA: Diagnosis not present

## 2017-03-10 DIAGNOSIS — M272 Inflammatory conditions of jaws: Secondary | ICD-10-CM | POA: Diagnosis not present

## 2017-03-11 ENCOUNTER — Encounter: Payer: Self-pay | Admitting: Internal Medicine

## 2017-03-11 ENCOUNTER — Ambulatory Visit (INDEPENDENT_AMBULATORY_CARE_PROVIDER_SITE_OTHER): Payer: Medicare Other | Admitting: Internal Medicine

## 2017-03-11 VITALS — BP 117/68 | HR 85 | Temp 98.2°F | Ht 62.0 in | Wt 145.0 lb

## 2017-03-11 DIAGNOSIS — M272 Inflammatory conditions of jaws: Secondary | ICD-10-CM

## 2017-03-11 NOTE — Progress Notes (Signed)
RFV: follow up for jaw osteo  Patient ID: [name], female   DOB: 04-08-1932, 83 y.o.   MRN: [id number]  HPI 83yo F with jaw osteo. Transitioned to amox/clav on 8/1 doing well. Finished out her 40 sessions of HBO which has helped with healing process to lower right open lesion to her jaw. She is noticing improvement in the appearance of the affected area. No diarrhea or yeast infection. She is looking forward to other teeth extraction  Outpatient Encounter Prescriptions as of 03/11/2017  Medication Sig  . amLODipine (NORVASC) 5 MG tablet Take 5 mg by mouth daily.  Marland Kitchen amoxicillin-clavulanate (AUGMENTIN) 875-125 MG tablet Take 1 tablet by mouth 2 (two) times daily.  Marland Kitchen atorvastatin (LIPITOR) 40 MG tablet Take 40 mg by mouth daily.  . baclofen (LIORESAL) 10 MG tablet Take 10 mg by mouth 2 (two) times daily as needed for muscle spasms.  . carvedilol (COREG) 25 MG tablet Take 25 mg by mouth 2 (two) times daily with a meal.  . cyclobenzaprine (FLEXERIL) 10 MG tablet Take 1 tablet (10 mg total) by mouth 3 (three) times daily as needed for muscle spasms.  Marland Kitchen gabapentin (NEURONTIN) 300 MG capsule Take 300 mg by mouth 3 (three) times daily.  Marland Kitchen losartan-hydrochlorothiazide (HYZAAR) 100-25 MG per tablet Take 1 tablet by mouth daily.  . mirtazapine (REMERON) 30 MG tablet Take 30 mg by mouth at bedtime.  . pantoprazole (PROTONIX) 40 MG tablet Take 1 tablet (40 mg total) by mouth 2 (two) times daily.  . protein supplement shake (PREMIER PROTEIN) LIQD Take 325 mLs (11 oz total) by mouth 2 (two) times daily between meals.  . [DISCONTINUED] HYDROcodone-acetaminophen (NORCO/VICODIN) 5-325 MG tablet Take 1 tablet by mouth every 4 (four) hours as needed for moderate pain.  . [DISCONTINUED] metFORMIN (GLUCOPHAGE-XR) 500 MG 24 hr tablet Take 500 mg by mouth daily with breakfast.   No facility-administered encounter medications on file as of 03/11/2017.      Patient Active Problem List   Diagnosis Date Noted  .  Abnormal CT scan, gastrointestinal tract 12/31/2016  . GI bleed 12/31/2016  . HLD (hyperlipidemia) 12/09/2016  . GERD (gastroesophageal reflux disease) 12/09/2016  . Depression 12/09/2016  . History of CVA (cerebrovascular accident) 12/09/2016  . CKD (chronic kidney disease), stage III 12/09/2016  . Back pain 12/09/2016  . Sepsis ([place]) 12/09/2016  . History of oral surgery 12/09/2016  . Osteomyelitis of jaw 12/09/2016  . Diabetes mellitus type 2, uncontrolled ([place]) 12/09/2016  . History of Lumbar herniated disc 08/09/2014  . UTI (lower urinary tract infection) 04/08/2012  . Pneumonia 04/04/2012  . Acute blood loss anemia 04/01/2012  . Fall at home 03/31/2012  . History of shingles 03/31/2012  . Anemia 03/31/2012  . Femur fracture, left ([place]) 03/31/2012  . Hypertension 03/30/2012     Health Maintenance Due  Topic Date Due  . FOOT EXAM  12/08/1943  . OPHTHALMOLOGY EXAM  12/08/1943  . TETANUS/TDAP  12/07/1952  . DEXA SCAN  12/08/1998  . PNA vac Low Risk Adult (1 of 2 - PCV13) 12/08/1998  . INFLUENZA VACCINE  03/05/2017     Review of Systems No diarrhea, 12 point ros otherwise Physical Exam   BP 117/68   Pulse 85   Temp 98.2 F (36.8 C) (Oral)   Ht _0  (1.575 m)   Wt 145 lb (65.8 kg)   BMI 26.52 kg/m    Physical Exam  Constitutional:  oriented to person, place, and time. appears well-developed  and well-nourished. No distress.  HENT: [place]/AT, PERRLA, no scleral icterus Mouth/Throat: Oropharynx is clear and moist. No oropharyngeal exudate. Poor dentition, decaying front lower teeth. Right lower molar region appears to be less necrotic. Pink tissue. Lymphadenopathy: no cervical adenopathy. No axillary adenopathy   CBC Lab Results  Component Value Date   WBC 11.0 (H) 01/02/2017   RBC 3.09 (L) 01/02/2017   HGB 8.7 (L) 01/02/2017   HCT 28.0 (L) 01/02/2017   PLT 360 01/02/2017   MCV 90.6 01/02/2017   MCH 28.2 01/02/2017   MCHC 31.1 01/02/2017   RDW 14.5  01/02/2017   LYMPHSABS 2.5 12/30/2016   MONOABS 0.7 12/30/2016   EOSABS 0.6 12/30/2016    BMET Lab Results  Component Value Date   NA 138 01/02/2017   K 4.1 01/02/2017   CL 106 01/02/2017   CO2 24 01/02/2017   GLUCOSE 117 (H) 01/02/2017   BUN 7 01/02/2017   CREATININE 0.85 01/02/2017   CALCIUM 8.5 (L) 01/02/2017   GFRNONAA >60 01/02/2017   GFRAA >60 01/02/2017   Lab Results  Component Value Date   ESRSEDRATE 94 (H) 02/06/2017   Lab Results  Component Value Date   CRP 4.8 03/11/2017      Assessment and Plan Jaw osteomyelitis = Will pull picc line since she is transitioned to oral abtx. Plan for addn 3 months Get sed rate and crp Continue iwht amox/clav. Return in 6 wk  Teeth decay = concern that she is at risk for jaw osteo at the future extraction site of her other affected teeth. Will continue to monitor on follow up visits

## 2017-03-11 NOTE — Progress Notes (Signed)
Per Dr Baxter Flattery 39 cm  Single lumen Peripherally Inserted Central Catheter  removed from right basilic . No sutures present. Dressing was clean and dry . Area cleansed with chlorhexidine and petroleum dressing applied. Pt advised no heavy lifting with this arm, leave dressing for 24 hours and call the office if dressing becomes soaked with blood or sharp pain presents.  Pt tolerated procedure well.    Laverle Patter, RN

## 2017-03-12 LAB — C-REACTIVE PROTEIN: CRP: 4.8 mg/L (ref <8.0)

## 2017-03-18 ENCOUNTER — Ambulatory Visit: Payer: Medicare Other | Admitting: Internal Medicine

## 2017-04-08 ENCOUNTER — Other Ambulatory Visit: Payer: Self-pay | Admitting: *Deleted

## 2017-04-10 ENCOUNTER — Other Ambulatory Visit: Payer: Self-pay

## 2017-04-10 ENCOUNTER — Telehealth: Payer: Self-pay

## 2017-04-10 DIAGNOSIS — M272 Inflammatory conditions of jaws: Secondary | ICD-10-CM

## 2017-04-10 MED ORDER — AMOXICILLIN-POT CLAVULANATE 875-125 MG PO TABS: 1.0000 | ORAL_TABLET | Freq: Two times a day (BID) | ORAL | 2 refills | Status: DC

## 2017-04-10 NOTE — Telephone Encounter (Signed)
Patient is calling to see if she should continue the amoxicillin. If so she is out and will need refill.  Laverle Patter, RN   Patient advised she will need to continue antibiotic thru November,  2018 per August, 2018 note by Dr Baxter Flattery.

## 2017-04-12 DIAGNOSIS — Z13820 Encounter for screening for osteoporosis: Secondary | ICD-10-CM | POA: Diagnosis not present

## 2017-04-12 DIAGNOSIS — M899 Disorder of bone, unspecified: Secondary | ICD-10-CM | POA: Diagnosis not present

## 2017-04-14 DIAGNOSIS — M545 Low back pain: Secondary | ICD-10-CM | POA: Diagnosis not present

## 2017-04-14 DIAGNOSIS — I10 Essential (primary) hypertension: Secondary | ICD-10-CM | POA: Diagnosis not present

## 2017-04-14 DIAGNOSIS — E119 Type 2 diabetes mellitus without complications: Secondary | ICD-10-CM | POA: Diagnosis not present

## 2017-04-14 DIAGNOSIS — Z79899 Other long term (current) drug therapy: Secondary | ICD-10-CM | POA: Diagnosis not present

## 2017-04-14 DIAGNOSIS — E7800 Pure hypercholesterolemia, unspecified: Secondary | ICD-10-CM | POA: Diagnosis not present

## 2017-04-22 ENCOUNTER — Encounter: Payer: Self-pay | Admitting: Internal Medicine

## 2017-04-22 ENCOUNTER — Ambulatory Visit (INDEPENDENT_AMBULATORY_CARE_PROVIDER_SITE_OTHER): Payer: Medicare Other | Admitting: Internal Medicine

## 2017-04-22 VITALS — BP 161/73 | HR 101 | Temp 98.5°F | Wt 146.0 lb

## 2017-04-22 DIAGNOSIS — M272 Inflammatory conditions of jaws: Secondary | ICD-10-CM | POA: Diagnosis not present

## 2017-04-22 NOTE — Progress Notes (Signed)
RFV: follow up for jaw osteo Patient ID: [name], female   DOB: 06/29/1932, 83 y.o.   MRN: [id number]  HPI [name] is a 83 yo F with hx of jaw osteo who reports that she is much improved. Finished 40 cycles of HBO therapy. Oral surgeon pleased with how area  On lew lower gum line has improved. Patient is looking forward to extraction of lower front teeth that have decayed  Outpatient Encounter Prescriptions as of 04/22/2017  Medication Sig  . amLODipine (NORVASC) 5 MG tablet Take 5 mg by mouth daily.  Marland Kitchen amoxicillin-clavulanate (AUGMENTIN) 875-125 MG tablet Take 1 tablet by mouth 2 (two) times daily.  Marland Kitchen atorvastatin (LIPITOR) 40 MG tablet Take 40 mg by mouth daily.  . baclofen (LIORESAL) 10 MG tablet Take 10 mg by mouth 2 (two) times daily as needed for muscle spasms.  . carvedilol (COREG) 25 MG tablet Take 25 mg by mouth 2 (two) times daily with a meal.  . cyclobenzaprine (FLEXERIL) 10 MG tablet Take 1 tablet (10 mg total) by mouth 3 (three) times daily as needed for muscle spasms.  Marland Kitchen gabapentin (NEURONTIN) 300 MG capsule Take 300 mg by mouth 3 (three) times daily.  Marland Kitchen losartan-hydrochlorothiazide (HYZAAR) 100-25 MG per tablet Take 1 tablet by mouth daily.  . mirtazapine (REMERON) 30 MG tablet Take 30 mg by mouth at bedtime.  . protein supplement shake (PREMIER PROTEIN) LIQD Take 325 mLs (11 oz total) by mouth 2 (two) times daily between meals.  . pantoprazole (PROTONIX) 40 MG tablet Take 1 tablet (40 mg total) by mouth 2 (two) times daily. (Patient not taking: Reported on 04/22/2017)   No facility-administered encounter medications on file as of 04/22/2017.      Patient Active Problem List   Diagnosis Date Noted  . Abnormal CT scan, gastrointestinal tract 12/31/2016  . GI bleed 12/31/2016  . HLD (hyperlipidemia) 12/09/2016  . GERD (gastroesophageal reflux disease) 12/09/2016  . Depression 12/09/2016  . History of CVA (cerebrovascular accident) 12/09/2016  . CKD (chronic kidney  disease), stage III 12/09/2016  . Back pain 12/09/2016  . Sepsis ([place]) 12/09/2016  . History of oral surgery 12/09/2016  . Osteomyelitis of jaw 12/09/2016  . Diabetes mellitus type 2, uncontrolled ([place]) 12/09/2016  . History of Lumbar herniated disc 08/09/2014  . UTI (lower urinary tract infection) 04/08/2012  . Pneumonia 04/04/2012  . Acute blood loss anemia 04/01/2012  . Fall at home 03/31/2012  . History of shingles 03/31/2012  . Anemia 03/31/2012  . Femur fracture, left ([place]) 03/31/2012  . Hypertension 03/30/2012     Health Maintenance Due  Topic Date Due  . FOOT EXAM  12/08/1943  . OPHTHALMOLOGY EXAM  12/08/1943  . TETANUS/TDAP  12/07/1952  . DEXA SCAN  12/08/1998  . PNA vac Low Risk Adult (1 of 2 - PCV13) 12/08/1998  . INFLUENZA VACCINE  03/05/2017     Review of Systems Review of Systems  Constitutional: Negative for fever, chills, diaphoresis, activity change, appetite change, fatigue and unexpected weight change.  HENT: Negative for congestion, sore throat, rhinorrhea, sneezing, trouble swallowing and sinus pressure.  Eyes: Negative for photophobia and visual disturbance.  Respiratory: Negative for cough, chest tightness, shortness of breath, wheezing and stridor.  Cardiovascular: Negative for chest pain, palpitations and leg swelling.  Gastrointestinal: Negative for nausea, vomiting, abdominal pain, diarrhea, constipation, blood in stool, abdominal distention and anal bleeding.  Genitourinary: Negative for dysuria, hematuria, flank pain and difficulty urinating.  Musculoskeletal: Negative for myalgias, back  pain, joint swelling, arthralgias and gait problem.  Skin: Negative for color change, pallor, rash and wound.  Neurological: Negative for dizziness, tremors, weakness and light-headedness.  Hematological: Negative for adenopathy. Does not bruise/bleed easily.  Psychiatric/Behavioral: Negative for behavioral problems, confusion, sleep disturbance, dysphoric mood,  decreased concentration and agitation.    Physical Exam   BP (!) 161/73   Pulse (!) 101   Temp 98.5 F (36.9 C) (Oral)   Wt 146 lb (66.2 kg)   BMI 26.70 kg/m   Physical Exam  Constitutional:  oriented to person, place, and time. appears well-developed and well-nourished. No distress.  HENT: [place]/AT, PERRLA, no scleral icterus Mouth/Throat: Oropharynx is clear and moist. No oropharyngeal exudate.  Poor dentition. Right sided lower jaw has still some bone exposure but no longer has any necrosis, healthy pink gum line. Lower front teeth signs of decay Skin: Skin is warm and dry. No rash noted. No erythema.  Psychiatric: a normal mood and affect.  behavior is normal.   CBC Lab Results  Component Value Date   WBC 11.0 (H) 01/02/2017   RBC 3.09 (L) 01/02/2017   HGB 8.7 (L) 01/02/2017   HCT 28.0 (L) 01/02/2017   PLT 360 01/02/2017   MCV 90.6 01/02/2017   MCH 28.2 01/02/2017   MCHC 31.1 01/02/2017   RDW 14.5 01/02/2017   LYMPHSABS 2.5 12/30/2016   MONOABS 0.7 12/30/2016   EOSABS 0.6 12/30/2016    BMET Lab Results  Component Value Date   NA 138 01/02/2017   K 4.1 01/02/2017   CL 106 01/02/2017   CO2 24 01/02/2017   GLUCOSE 117 (H) 01/02/2017   BUN 7 01/02/2017   CREATININE 0.85 01/02/2017   CALCIUM 8.5 (L) 01/02/2017   GFRNONAA >60 01/02/2017   GFRAA >60 01/02/2017    Lab Results  Component Value Date   ESRSEDRATE 46 (H) 04/22/2017   Lab Results  Component Value Date   CRP 16.6 (H) 04/22/2017     Assessment and Plan  Jaw osteo = improving inflammatory markers. Recommend to continue amox/clav for another 4 wk

## 2017-04-23 LAB — SEDIMENTATION RATE: Sed Rate: 46 mm/h — ABNORMAL HIGH (ref 0–30)

## 2017-04-23 LAB — C-REACTIVE PROTEIN: CRP: 16.6 mg/L — AB (ref <8.0)

## 2017-04-29 DIAGNOSIS — S32512A Fracture of superior rim of left pubis, initial encounter for closed fracture: Secondary | ICD-10-CM | POA: Diagnosis not present

## 2017-04-29 DIAGNOSIS — S32592A Other specified fracture of left pubis, initial encounter for closed fracture: Secondary | ICD-10-CM | POA: Diagnosis not present

## 2017-08-11 DIAGNOSIS — M1990 Unspecified osteoarthritis, unspecified site: Secondary | ICD-10-CM | POA: Diagnosis not present

## 2017-08-11 DIAGNOSIS — E119 Type 2 diabetes mellitus without complications: Secondary | ICD-10-CM | POA: Diagnosis not present

## 2017-08-11 DIAGNOSIS — M545 Low back pain: Secondary | ICD-10-CM | POA: Diagnosis not present

## 2017-08-11 DIAGNOSIS — Z139 Encounter for screening, unspecified: Secondary | ICD-10-CM | POA: Diagnosis not present

## 2017-11-04 DIAGNOSIS — I10 Essential (primary) hypertension: Secondary | ICD-10-CM | POA: Diagnosis not present

## 2017-11-04 DIAGNOSIS — E119 Type 2 diabetes mellitus without complications: Secondary | ICD-10-CM | POA: Diagnosis not present

## 2017-11-04 DIAGNOSIS — J189 Pneumonia, unspecified organism: Secondary | ICD-10-CM | POA: Diagnosis not present

## 2017-11-07 DIAGNOSIS — J189 Pneumonia, unspecified organism: Secondary | ICD-10-CM | POA: Diagnosis not present

## 2017-12-16 DIAGNOSIS — M545 Low back pain: Secondary | ICD-10-CM | POA: Diagnosis not present

## 2017-12-16 DIAGNOSIS — Z79899 Other long term (current) drug therapy: Secondary | ICD-10-CM | POA: Diagnosis not present

## 2017-12-16 DIAGNOSIS — E7800 Pure hypercholesterolemia, unspecified: Secondary | ICD-10-CM | POA: Diagnosis not present

## 2017-12-16 DIAGNOSIS — M1990 Unspecified osteoarthritis, unspecified site: Secondary | ICD-10-CM | POA: Diagnosis not present

## 2017-12-16 DIAGNOSIS — E119 Type 2 diabetes mellitus without complications: Secondary | ICD-10-CM | POA: Diagnosis not present

## 2018-02-11 DIAGNOSIS — R0902 Hypoxemia: Secondary | ICD-10-CM | POA: Diagnosis not present

## 2018-02-11 DIAGNOSIS — R0609 Other forms of dyspnea: Secondary | ICD-10-CM | POA: Diagnosis not present

## 2018-02-12 DIAGNOSIS — R0602 Shortness of breath: Secondary | ICD-10-CM | POA: Diagnosis not present

## 2018-02-12 DIAGNOSIS — R0609 Other forms of dyspnea: Secondary | ICD-10-CM | POA: Diagnosis not present

## 2018-02-12 DIAGNOSIS — R0902 Hypoxemia: Secondary | ICD-10-CM | POA: Diagnosis not present

## 2018-02-16 DIAGNOSIS — J9611 Chronic respiratory failure with hypoxia: Secondary | ICD-10-CM | POA: Diagnosis not present

## 2018-02-16 DIAGNOSIS — Z1339 Encounter for screening examination for other mental health and behavioral disorders: Secondary | ICD-10-CM | POA: Diagnosis not present

## 2018-02-17 ENCOUNTER — Other Ambulatory Visit: Payer: Self-pay | Admitting: Family Medicine

## 2018-02-17 DIAGNOSIS — J9611 Chronic respiratory failure with hypoxia: Secondary | ICD-10-CM

## 2018-02-19 ENCOUNTER — Other Ambulatory Visit (HOSPITAL_COMMUNITY): Payer: Self-pay | Admitting: Family Medicine

## 2018-02-19 DIAGNOSIS — J9610 Chronic respiratory failure, unspecified whether with hypoxia or hypercapnia: Secondary | ICD-10-CM

## 2018-02-20 ENCOUNTER — Ambulatory Visit (HOSPITAL_COMMUNITY)
Admission: RE | Admit: 2018-02-20 | Discharge: 2018-02-20 | Disposition: A | Discharge status: Home / Self Care | Payer: Medicare Other | Source: Ambulatory Visit | Attending: Family Medicine | Admitting: Family Medicine

## 2018-02-20 DIAGNOSIS — Z8673 Personal history of transient ischemic attack (TIA), and cerebral infarction without residual deficits: Secondary | ICD-10-CM | POA: Diagnosis not present

## 2018-02-20 DIAGNOSIS — J9610 Chronic respiratory failure, unspecified whether with hypoxia or hypercapnia: Secondary | ICD-10-CM | POA: Insufficient documentation

## 2018-02-20 DIAGNOSIS — I272 Pulmonary hypertension, unspecified: Secondary | ICD-10-CM | POA: Diagnosis not present

## 2018-02-20 DIAGNOSIS — I10 Essential (primary) hypertension: Secondary | ICD-10-CM | POA: Diagnosis not present

## 2018-02-20 DIAGNOSIS — E119 Type 2 diabetes mellitus without complications: Secondary | ICD-10-CM | POA: Diagnosis not present

## 2018-02-20 DIAGNOSIS — R0600 Dyspnea, unspecified: Secondary | ICD-10-CM | POA: Diagnosis not present

## 2018-02-20 NOTE — Progress Notes (Signed)
  Echocardiogram 2D Echocardiogram has been performed.  Jennette Dubin 02/20/2018, 1:39 PM

## 2018-02-26 DIAGNOSIS — J9611 Chronic respiratory failure with hypoxia: Secondary | ICD-10-CM | POA: Diagnosis not present

## 2018-02-26 DIAGNOSIS — I50810 Right heart failure, unspecified: Secondary | ICD-10-CM | POA: Diagnosis not present

## 2018-02-26 DIAGNOSIS — N390 Urinary tract infection, site not specified: Secondary | ICD-10-CM | POA: Diagnosis not present

## 2018-02-26 DIAGNOSIS — K219 Gastro-esophageal reflux disease without esophagitis: Secondary | ICD-10-CM | POA: Diagnosis not present

## 2018-02-26 DIAGNOSIS — R309 Painful micturition, unspecified: Secondary | ICD-10-CM | POA: Diagnosis not present

## 2018-02-26 DIAGNOSIS — I272 Pulmonary hypertension, unspecified: Secondary | ICD-10-CM | POA: Diagnosis not present

## 2018-02-26 LAB — PULMONARY FUNCTION TEST

## 2018-02-27 ENCOUNTER — Ambulatory Visit
Admission: RE | Admit: 2018-02-27 | Discharge: 2018-02-27 | Disposition: A | Discharge status: Home / Self Care | Payer: Medicare Other | Source: Ambulatory Visit | Attending: Family Medicine | Admitting: Family Medicine

## 2018-02-27 DIAGNOSIS — J439 Emphysema, unspecified: Secondary | ICD-10-CM | POA: Diagnosis not present

## 2018-02-27 DIAGNOSIS — J9611 Chronic respiratory failure with hypoxia: Secondary | ICD-10-CM

## 2018-03-03 NOTE — Progress Notes (Signed)
Cardiology Office Note   Date:  03/06/2018   ID:  VICKEY BOAK, DOB 12-Oct-1931, MRN [id number]  PCP:  [name], PA-C  Cardiologist:   Jenkins Rouge, MD   No chief complaint on file.     History of Present Illness: [name] is a 84 y.o. female who presents for consultation regarding pulmonary HTN Referred by Dr Ovid Curd Reviewed echo from 02/20/18 done for dyspnea Interestingly echo done 12/31/16 did not show this with estimated PA pressure of 31 mmHg She has had elevated ESR;s as high as 140 a year ago and 46 10 months ago  Study Conclusions  - Left ventricle: There was moderate focal basal hypertrophy.   Systolic function was mildly reduced. The estimated ejection   fraction was in the range of 45% to 50%. Mild diffuse   hypokinesis. The study is not technically sufficient to allow   evaluation of LV diastolic function. - Ventricular septum: The contour showed an abnormal configuration,   diastolic flattening, and systolic flattening. These changes are   consistent with RV volume and pressure overload. - Aortic valve: Mildly thickened, mildly calcified leaflets. - Mitral valve: Calcified annulus. - Right ventricle: The cavity size was severely dilated. Systolic   function was severely reduced. - Right atrium: The atrium was moderately dilated. - Tricuspid valve: There was moderate regurgitation. - Pulmonic valve: There was mild regurgitation. - Pulmonary arteries: PA peak pressure: 61 mm Hg (S).  Impressions:  - The right ventricular systolic pressure was increased consistent   with moderate pulmonary hypertension.   Last year she has been Rx for osteomyelitis of jaw complicated by sepsis and GI bleed May 2018 Had  DU;s on EGD 02/27/18 CT chest w/o contrast showed mild mosaic attenuation ? Air trapping mild emphysema primary note indicated no PE but study was done without contrast   She is a non smoker Started on oxygen 24/7   Hct 41.3 BNP 734  She is  very fatigued and washed out on her current meds Can barely stand up Doesn't wear her oxygen all the time at night   Past Medical History:  Diagnosis Date  . Anxiety   . Arthritis    osteo; "mostly hands, knees, probably back too" (12/09/2016)  . Chronic lower back pain   . Complication of anesthesia 2011   resp distress -on vent after surgery  . Depression   . Diabetes ([place])   . Esophageal stricture   . GERD (gastroesophageal reflux disease)   . Headache    out grew them  . High cholesterol   . History of blood transfusion 2011; ?03/2012   "related to ORs" (12/09/2016)  . Hypertension   . Intestinal obstruction ([place])   . Macular degeneration of left eye    dx over 55 yrs ago.....hasn't changed much  . Shingles   . Squamous carcinoma 2013   squamas cell on scalp--took 14 radiation tx--2013  . Stroke [facility]) 1967   Mini stroke;  No lasting deficits  . Type II diabetes mellitus ([place]) dx'd 11/2016    Past Surgical History:  Procedure Laterality Date  . ABDOMINAL HYSTERECTOMY    . APPENDECTOMY    . BACK SURGERY    . CATARACT EXTRACTION, BILATERAL Bilateral   . CHOLECYSTECTOMY    . COLON SURGERY    . COLONOSCOPY    . ESOPHAGOGASTRODUODENOSCOPY (EGD) WITH ESOPHAGEAL DILATION  "several times"  . ESOPHAGOGASTRODUODENOSCOPY (EGD) WITH PROPOFOL N/A 12/31/2016   Procedure: ESOPHAGOGASTRODUODENOSCOPY (EGD) WITH PROPOFOL;  Surgeon: Wilford Corner, MD;  Location: [facility];  Service: Endoscopy;  Laterality: N/A;  . EYE SURGERY    . FRACTURE SURGERY    . IR FLUORO GUIDE CV LINE RIGHT  01/02/2017  . IR US GUIDE VASC ACCESS RIGHT  01/02/2017  . JOINT REPLACEMENT    . LUMBAR DISC SURGERY  10/25/2014   Right L4-L5 removal of seven free fragments of disk mostly posterolaterally.  Lysis of adhesion.  Microscope/notes 10/26/2014  . LUMBAR LAMINECTOMY/DECOMPRESSION MICRODISCECTOMY Right 08/09/2014   Procedure: Right Lumbar Four to Five Microdiskectomy;  Surgeon: Floyce Stakes, MD;  Location:  [facility];  Service: Neurosurgery;  Laterality: Right;  Right L4-5 Microdiskectomy  . ORIF PERIPROSTHETIC FRACTURE  03/31/2012   Procedure: OPEN REDUCTION INTERNAL FIXATION (ORIF) PERIPROSTHETIC FRACTURE;  Surgeon: Mauri Pole, MD;  Location: [facility];  Service: Orthopedics;  Laterality: Left;  Open reduction internal fixation Left distal femur periprosthetic fracture  . SMALL INTESTINE SURGERY  2011   "really bad blockage; went into respiratory distress and was on ventilator after surgery; [facility]"  . SQUAMOUS CELL CARCINOMA EXCISION  ~ 2012   S/P "cut off her head then 15 radiation txs"   . TOTAL KNEE ARTHROPLASTY Bilateral    bilateral     Current Outpatient Medications  Medication Sig Dispense Refill  . amLODipine (NORVASC) 5 MG tablet Take 5 mg by mouth daily.    Marland Kitchen atorvastatin (LIPITOR) 40 MG tablet Take 40 mg by mouth daily.    . baclofen (LIORESAL) 10 MG tablet Take 10 mg by mouth 2 (two) times daily as needed for muscle spasms.    . carvedilol (COREG) 25 MG tablet Take 25 mg by mouth 2 (two) times daily with a meal.    . cyclobenzaprine (FLEXERIL) 10 MG tablet Take 1 tablet (10 mg total) by mouth 3 (three) times daily as needed for muscle spasms. 30 tablet 0  . gabapentin (NEURONTIN) 300 MG capsule Take 300 mg by mouth 3 (three) times daily.    Marland Kitchen losartan-hydrochlorothiazide (HYZAAR) 100-25 MG per tablet Take 1 tablet by mouth daily.    . metFORMIN (GLUCOPHAGE) 500 MG tablet Take 500 mg by mouth as directed.    . mirtazapine (REMERON) 30 MG tablet Take 30 mg by mouth at bedtime.    . pantoprazole (PROTONIX) 40 MG tablet Take 1 tablet (40 mg total) by mouth 2 (two) times daily. 120 tablet 0  . protein supplement shake (PREMIER PROTEIN) LIQD Take 325 mLs (11 oz total) by mouth 2 (two) times daily between meals. 60 Can 0   No current facility-administered medications for this visit.     Allergies:   Sulfa antibiotics    Social History:  The patient  reports that she  quit smoking about 26 years ago. Her smoking use included cigarettes. She has a 1.00 pack-year smoking history. She has never used smokeless tobacco. She reports that she does not drink alcohol or use drugs.   Family History:  The patient's family history is not on file.    ROS:  Please see the history of present illness.   Otherwise, review of systems are positive for none.   All other systems are reviewed and negative.    PHYSICAL EXAM: VS:  BP 103/67 Comment: hadnt taken meds  Pulse 78   Ht _0  (1.575 m)   Wt 144 lb (65.3 kg) Comment: patient reported  SpO2 99%   BMI 26.34 kg/m  , BMI Body mass index is 26.34  kg/m. Affect appropriate Chronically ill white female  HEENT: normal Neck supple with no adenopathy JVP normal no bruits no thyromegaly Lungs rhonchi and inspiratory crackles right >left expiratory wheezing and good diaphragmatic motion Heart:  S1/S2 no murmur, no rub, gallop or click PMI normal Abdomen: benighn, BS positve, no tenderness, no AAA no bruit.  No HSM or HJR Distal pulses intact with no bruits No edema Neuro non-focal Skin warm and dry No muscular weakness    EKG:  12/28/16 ST rate 113 PVC no acute ST changes or RV strain  03/06/18 SR Rate 77 Anterolateral T wave changes    Recent Labs: No results found for requested labs within last 8760 hours.    Lipid Panel No results found for: CHOL, TRIG, HDL, CHOLHDL, VLDL, LDLCALC, LDLDIRECT    Wt Readings from Last 3 Encounters:  03/06/18 144 lb (65.3 kg)  04/22/17 146 lb (66.2 kg)  03/11/17 145 lb (65.8 kg)      Other studies Reviewed: Additional studies/ records that were reviewed today include: Hospital notes May 2018 labs recent TTE And CT scan notes from [facility].    ASSESSMENT AND PLAN:  1.  Pulmonary HTN Etiology not clear but likely post inflammatory with elevated ESR and infection Has not had PE r/o since CT was non contrast will order V/Q Current medication dropping BP too low and makes her  fatigued. Cut coreg in half, stop ARB to make room for Vasodilator and stop norvasc Continue diuretic Start revatio 20 tid  F/U with DB in CHF clinic But do not think invasive right heart cath warranted If V/Q is negative would just Rx with dilator, diuretic and oxygen 2. HTN:  See changes above 3. DM: Discussed low carb diet.  Target hemoglobin A1c is 6.5 or less.  Continue current medications.    Current medicines are reviewed at length with the patient today.  The patient does not have concerns regarding medicines.  The following changes have been made:  D/C norvasc and Hyzaar Cut coreg in half Add revatio 20 tid and HCTZ 25 mg   Labs/ tests ordered today include: V/Q scan  No orders of the defined types were placed in this encounter.    Disposition:   FU with  CHF clinic x 1 and me in 6 months     Signed, Jenkins Rouge, MD  03/06/2018 11:13 AM    [facility] [address], [place], [place]  [address] Phone: [phone]; Fax: [phone]

## 2018-03-06 ENCOUNTER — Telehealth: Payer: Self-pay

## 2018-03-06 ENCOUNTER — Ambulatory Visit: Payer: Medicare Other | Admitting: Cardiovascular Disease

## 2018-03-06 ENCOUNTER — Encounter: Payer: Self-pay | Admitting: Cardiovascular Disease

## 2018-03-06 VITALS — BP 103/67 | HR 78 | Ht 62.0 in | Wt 144.0 lb

## 2018-03-06 DIAGNOSIS — I272 Pulmonary hypertension, unspecified: Secondary | ICD-10-CM

## 2018-03-06 DIAGNOSIS — Z9189 Other specified personal risk factors, not elsewhere classified: Secondary | ICD-10-CM

## 2018-03-06 MED ORDER — SILDENAFIL CITRATE 20 MG PO TABS: 20.0000 mg | ORAL_TABLET | Freq: Three times a day (TID) | ORAL | 11 refills | Status: DC

## 2018-03-06 MED ORDER — HYDROCHLOROTHIAZIDE 25 MG PO TABS: 25.0000 mg | ORAL_TABLET | Freq: Every day | ORAL | 3 refills | Status: DC

## 2018-03-06 MED ORDER — CARVEDILOL 12.5 MG PO TABS: 12.5000 mg | ORAL_TABLET | Freq: Two times a day (BID) | ORAL | 3 refills | Status: DC

## 2018-03-06 NOTE — Patient Instructions (Addendum)
Medication Instructions:  Your physician has recommended you make the following change in your medication:  1-STOP amlodipine 2-STOP Losartan-HCTZ (Hyzaar) 3-START Revatio 20 mg by mouth twice daily for 2 weeks, then 20 mg by mouth three times daily 4-START Hydrochlorothiazide  25 mg by mouth daily 5-DECREASE carvedilol 12.5 mg by mouth twice daily  Lab work: NONE  Testing/Procedures: Your physician wants you to have a VQscan.   Follow-Up: You have been referred to Dr. Haroldine Laws at our [facility].  Your physician wants you to follow-up in: 3 months with Dr. Johnsie Cancel.   If you need a refill on your cardiac medications before your next appointment, please call your pharmacy.   Sildenafil tablets (Revatio) What is this medicine? SILDENAFIL (sil DEN a fil) is used to treat pulmonary arterial hypertension. This is a serious heart and lung condition. This medicine helps to improve symptoms and quality of life. This medicine may be used for other purposes; ask your health care provider or pharmacist if you have questions. COMMON BRAND NAME(S): Revatio What should I tell my health care provider before I take this medicine? They need to know if you have any of these conditions: -anatomical deformation of the penis, Peyronie's disease, or history of priapism (painful and prolonged erection) -bleeding disorders -eye disease, vision problems -heart disease -high or low blood pressure -history of blood diseases, like sickle cell anemia or leukemia -kidney disease -liver disease -pulmonary veno-occlusive disease (PVOD) -stomach ulcer -an unusual or allergic reaction to sildenafil, other medicines, foods, dyes, or preservatives -pregnant or trying to get pregnant -breast-feeding How should I use this medicine? Take this medicine by mouth with a glass of water. Follow the directions on the prescription label. You can take it with or without food. If it upsets your stomach, take it  with food. Take your doses at regular intervals about 4 to 6 hours apart. Do not take it more often than directed. Do not stop taking except on your doctor's advice. Talk to your pediatrician regarding the use of this medicine in children. This medicine is not approved for use in children. Overdosage: If you think you have taken too much of this medicine contact a poison control center or emergency room at once. NOTE: This medicine is only for you. Do not share this medicine with others. What if I miss a dose? If you miss a dose, take it as soon as you can. If it is almost time for your next dose, take only that dose. Do not take double or extra doses. What may interact with this medicine? Do not take this medicine with any of the following medications: -cisapride -cobicistat -nitrates like amyl nitrite, isosorbide dinitrate, isosorbide mononitrate, nitroglycerin -riociguat -telaprevir This medicine may also interact with the following medications: -antiviral medicines for HIV or AIDS -bosentan -certain medicines for benign prostatic hyperplasia (BPH) -certain medicines for blood pressure -certain medicines for fungal infections like ketoconazole and itraconazole -cimetidine -erythromycin -rifampin This list may not describe all possible interactions. Give your health care provider a list of all the medicines, herbs, non-prescription drugs, or dietary supplements you use. Also tell them if you smoke, drink alcohol, or use illegal drugs. Some items may interact with your medicine. What should I watch for while using this medicine? Tell your doctor or healthcare professional if your symptoms do not start to get better or if they get worse. Tell your doctor or health care professional right away if you have any change in your eyesight or hearing.  You may get dizzy. Do not drive, use machinery, or do anything that needs mental alertness until you know how this medicine affects you. Do not stand  or sit up quickly, especially if you are an older patient. This reduces the risk of dizzy or fainting spells. Avoid alcoholic drinks; they can make you more dizzy. What side effects may I notice from receiving this medicine? Side effects that you should report to your doctor or health care professional as soon as possible: -allergic reactions like skin rash, itching or hives, swelling of the face, lips, or tongue -breathing problems -changes in vision -chest pain -decreased hearing -fast, irregular heartbeat -men: prolonged or painful erection (lasting more than 4 hours) Side effects that usually do not require medical attention (report to your doctor or health care professional if they continue or are bothersome): -facial flushing -headache -nosebleed -trouble sleeping -upset stomach This list may not describe all possible side effects. Call your doctor for medical advice about side effects. You may report side effects to FDA at 1-800-FDA-1088. Where should I keep my medicine? Keep out of reach of children. Store at room temperature between 15 and 30 degrees C (59 and 86 degrees F). Throw away any unused medicine after the expiration date. NOTE: This sheet is a summary. It may not cover all possible information. If you have questions about this medicine, talk to your doctor, pharmacist, or health care provider.  2018 Elsevier/Gold Standard (2015-07-05 17:18:06) Sildenafil tablets (Revatio) What is this medicine? SILDENAFIL (sil DEN a fil) is used to treat pulmonary arterial hypertension. This is a serious heart and lung condition. This medicine helps to improve symptoms and quality of life. This medicine may be used for other purposes; ask your health care provider or pharmacist if you have questions. COMMON BRAND NAME(S): Revatio What should I tell my health care provider before I take this medicine? They need to know if you have any of these conditions: -anatomical deformation of the  penis, Peyronie's disease, or history of priapism (painful and prolonged erection) -bleeding disorders -eye disease, vision problems -heart disease -high or low blood pressure -history of blood diseases, like sickle cell anemia or leukemia -kidney disease -liver disease -pulmonary veno-occlusive disease (PVOD) -stomach ulcer -an unusual or allergic reaction to sildenafil, other medicines, foods, dyes, or preservatives -pregnant or trying to get pregnant -breast-feeding How should I use this medicine? Take this medicine by mouth with a glass of water. Follow the directions on the prescription label. You can take it with or without food. If it upsets your stomach, take it with food. Take your doses at regular intervals about 4 to 6 hours apart. Do not take it more often than directed. Do not stop taking except on your doctor's advice. Talk to your pediatrician regarding the use of this medicine in children. This medicine is not approved for use in children. Overdosage: If you think you have taken too much of this medicine contact a poison control center or emergency room at once. NOTE: This medicine is only for you. Do not share this medicine with others. What if I miss a dose? If you miss a dose, take it as soon as you can. If it is almost time for your next dose, take only that dose. Do not take double or extra doses. What may interact with this medicine? Do not take this medicine with any of the following medications: -cisapride -cobicistat -nitrates like amyl nitrite, isosorbide dinitrate, isosorbide mononitrate, nitroglycerin -riociguat -telaprevir This medicine may  also interact with the following medications: -antiviral medicines for HIV or AIDS -bosentan -certain medicines for benign prostatic hyperplasia (BPH) -certain medicines for blood pressure -certain medicines for fungal infections like ketoconazole and itraconazole -cimetidine -erythromycin -rifampin This list may  not describe all possible interactions. Give your health care provider a list of all the medicines, herbs, non-prescription drugs, or dietary supplements you use. Also tell them if you smoke, drink alcohol, or use illegal drugs. Some items may interact with your medicine. What should I watch for while using this medicine? Tell your doctor or healthcare professional if your symptoms do not start to get better or if they get worse. Tell your doctor or health care professional right away if you have any change in your eyesight or hearing. You may get dizzy. Do not drive, use machinery, or do anything that needs mental alertness until you know how this medicine affects you. Do not stand or sit up quickly, especially if you are an older patient. This reduces the risk of dizzy or fainting spells. Avoid alcoholic drinks; they can make you more dizzy. What side effects may I notice from receiving this medicine? Side effects that you should report to your doctor or health care professional as soon as possible: -allergic reactions like skin rash, itching or hives, swelling of the face, lips, or tongue -breathing problems -changes in vision -chest pain -decreased hearing -fast, irregular heartbeat -men: prolonged or painful erection (lasting more than 4 hours) Side effects that usually do not require medical attention (report to your doctor or health care professional if they continue or are bothersome): -facial flushing -headache -nosebleed -trouble sleeping -upset stomach This list may not describe all possible side effects. Call your doctor for medical advice about side effects. You may report side effects to FDA at 1-800-FDA-1088. Where should I keep my medicine? Keep out of reach of children. Store at room temperature between 15 and 30 degrees C (59 and 86 degrees F). Throw away any unused medicine after the expiration date. NOTE: This sheet is a summary. It may not cover all possible information.  If you have questions about this medicine, talk to your doctor, pharmacist, or health care provider.  2018 Elsevier/Gold Standard (2015-07-05 17:18:06)

## 2018-03-06 NOTE — Telephone Encounter (Signed)
-----  Message from Earnestine Mealing sent at 03/06/2018  2:31 PM EDT ----- Patient will need an order for a chest xray prior to [facility]. Thanks

## 2018-03-09 ENCOUNTER — Telehealth: Payer: Self-pay

## 2018-03-09 ENCOUNTER — Telehealth: Payer: Self-pay | Admitting: Cardiovascular Disease

## 2018-03-09 NOTE — Telephone Encounter (Signed)
**Note De-Identified Marit Goodwill Obfuscation** The pt is advised that her Sildenafil PA was approved through her insurance company and that I have notified her pharmacy of the approval as well. She thanked me for my help and for calling her to let her know.

## 2018-03-09 NOTE — Telephone Encounter (Signed)
Approval received on the pts Sildenafil PA per letter received via fax from [place]. Approval good until 08/04/2018. [id number]

## 2018-03-09 NOTE — Telephone Encounter (Signed)
Pt calling stating that her pharmacy stated that she needs a prior auth for her medication sildenafil. I explained this to the pt and also called pt's pharmacy to verify that this is what the pt needs to be able to get medication. I involved pt that I was sending information to [employer], LPN, that handles prior auth. Pt verbalized understanding.

## 2018-03-09 NOTE — Telephone Encounter (Signed)
**Note De-Identified Nyaire Denbleyker Obfuscation** I have done a Sildenafil PA through covermymeds. Key: [id number]

## 2018-03-10 ENCOUNTER — Ambulatory Visit (HOSPITAL_COMMUNITY)
Admission: RE | Admit: 2018-03-10 | Discharge: 2018-03-10 | Disposition: A | Discharge status: Home / Self Care | Payer: Medicare Other | Source: Ambulatory Visit | Attending: Cardiovascular Disease | Admitting: Cardiovascular Disease

## 2018-03-10 ENCOUNTER — Encounter (HOSPITAL_COMMUNITY)
Admission: RE | Admit: 2018-03-10 | Discharge: 2018-03-10 | Disposition: A | Discharge status: Home / Self Care | Payer: Medicare Other | Source: Ambulatory Visit | Attending: Cardiovascular Disease | Admitting: Cardiovascular Disease

## 2018-03-10 DIAGNOSIS — I272 Pulmonary hypertension, unspecified: Secondary | ICD-10-CM | POA: Diagnosis not present

## 2018-03-10 DIAGNOSIS — I700 Atherosclerosis of aorta: Secondary | ICD-10-CM | POA: Diagnosis not present

## 2018-03-10 DIAGNOSIS — Z9189 Other specified personal risk factors, not elsewhere classified: Secondary | ICD-10-CM | POA: Diagnosis not present

## 2018-03-10 DIAGNOSIS — R0602 Shortness of breath: Secondary | ICD-10-CM | POA: Diagnosis not present

## 2018-03-10 MED ORDER — TECHNETIUM TO 99M ALBUMIN AGGREGATED
4.0000 | Freq: Once | INTRAVENOUS | Status: AC | PRN
  Administered 2018-03-10: 4 via INTRAVENOUS

## 2018-03-10 MED ORDER — TECHNETIUM TC 99M DIETHYLENETRIAME-PENTAACETIC ACID
32.0000 | Freq: Once | INTRAVENOUS | Status: AC | PRN
  Administered 2018-03-10: 32 via RESPIRATORY_TRACT

## 2018-03-13 ENCOUNTER — Ambulatory Visit (HOSPITAL_COMMUNITY): Payer: Medicare Other

## 2018-03-15 DIAGNOSIS — R0902 Hypoxemia: Secondary | ICD-10-CM | POA: Diagnosis not present

## 2018-03-18 DIAGNOSIS — I50810 Right heart failure, unspecified: Secondary | ICD-10-CM | POA: Diagnosis not present

## 2018-03-18 DIAGNOSIS — E119 Type 2 diabetes mellitus without complications: Secondary | ICD-10-CM | POA: Diagnosis not present

## 2018-03-18 DIAGNOSIS — I272 Pulmonary hypertension, unspecified: Secondary | ICD-10-CM | POA: Diagnosis not present

## 2018-03-18 DIAGNOSIS — I10 Essential (primary) hypertension: Secondary | ICD-10-CM | POA: Diagnosis not present

## 2018-03-18 DIAGNOSIS — J9611 Chronic respiratory failure with hypoxia: Secondary | ICD-10-CM | POA: Diagnosis not present

## 2018-04-13 ENCOUNTER — Other Ambulatory Visit: Payer: Self-pay

## 2018-04-13 NOTE — Patient Outreach (Signed)
[facility]) Care Management  04/13/2018  [name] August 23, 1931 [id number]   Medication Adherence call to [name] spoke with patient she said she is no longer taking Olmesartan/Hctz 40/25 and Metformin Er 500 mg doctor took her off both medications on Atorvastatin she said she still has some and will order it when she finished what she has. [name] is showing past due under [place].   [place] Management Direct Dial [phone]  Fax [phone] [email or web address]

## 2018-04-15 DIAGNOSIS — R0902 Hypoxemia: Secondary | ICD-10-CM | POA: Diagnosis not present

## 2018-05-08 ENCOUNTER — Other Ambulatory Visit (HOSPITAL_COMMUNITY): Payer: Self-pay | Admitting: *Deleted

## 2018-05-08 ENCOUNTER — Telehealth (HOSPITAL_COMMUNITY): Payer: Self-pay | Admitting: *Deleted

## 2018-05-08 ENCOUNTER — Ambulatory Visit (HOSPITAL_COMMUNITY)
Admission: RE | Admit: 2018-05-08 | Discharge: 2018-05-08 | Disposition: A | Discharge status: Home / Self Care | Payer: Medicare Other | Source: Ambulatory Visit | Attending: Cardiology | Admitting: Cardiology

## 2018-05-08 ENCOUNTER — Encounter (HOSPITAL_COMMUNITY): Payer: Self-pay | Admitting: Cardiology

## 2018-05-08 ENCOUNTER — Other Ambulatory Visit: Payer: Self-pay

## 2018-05-08 VITALS — BP 139/90 | HR 93 | Wt 139.1 lb

## 2018-05-08 DIAGNOSIS — I110 Hypertensive heart disease with heart failure: Secondary | ICD-10-CM | POA: Insufficient documentation

## 2018-05-08 DIAGNOSIS — Z7984 Long term (current) use of oral hypoglycemic drugs: Secondary | ICD-10-CM | POA: Diagnosis not present

## 2018-05-08 DIAGNOSIS — I5032 Chronic diastolic (congestive) heart failure: Secondary | ICD-10-CM | POA: Diagnosis not present

## 2018-05-08 DIAGNOSIS — J439 Emphysema, unspecified: Secondary | ICD-10-CM | POA: Insufficient documentation

## 2018-05-08 DIAGNOSIS — Z9981 Dependence on supplemental oxygen: Secondary | ICD-10-CM | POA: Diagnosis not present

## 2018-05-08 DIAGNOSIS — I272 Pulmonary hypertension, unspecified: Secondary | ICD-10-CM | POA: Insufficient documentation

## 2018-05-08 DIAGNOSIS — K219 Gastro-esophageal reflux disease without esophagitis: Secondary | ICD-10-CM | POA: Insufficient documentation

## 2018-05-08 DIAGNOSIS — Z85828 Personal history of other malignant neoplasm of skin: Secondary | ICD-10-CM | POA: Insufficient documentation

## 2018-05-08 DIAGNOSIS — Z79899 Other long term (current) drug therapy: Secondary | ICD-10-CM | POA: Diagnosis not present

## 2018-05-08 DIAGNOSIS — J9610 Chronic respiratory failure, unspecified whether with hypoxia or hypercapnia: Secondary | ICD-10-CM

## 2018-05-08 DIAGNOSIS — Z87891 Personal history of nicotine dependence: Secondary | ICD-10-CM | POA: Insufficient documentation

## 2018-05-08 DIAGNOSIS — M272 Inflammatory conditions of jaws: Secondary | ICD-10-CM | POA: Insufficient documentation

## 2018-05-08 DIAGNOSIS — E785 Hyperlipidemia, unspecified: Secondary | ICD-10-CM | POA: Insufficient documentation

## 2018-05-08 DIAGNOSIS — I50810 Right heart failure, unspecified: Secondary | ICD-10-CM | POA: Diagnosis not present

## 2018-05-08 DIAGNOSIS — E119 Type 2 diabetes mellitus without complications: Secondary | ICD-10-CM | POA: Diagnosis not present

## 2018-05-08 LAB — COMPREHENSIVE METABOLIC PANEL
ALBUMIN: 3.6 g/dL (ref 3.5–5.0)
ALT: 13 U/L (ref 0–44)
ANION GAP: 10 (ref 5–15)
AST: 24 U/L (ref 15–41)
Alkaline Phosphatase: 59 U/L (ref 38–126)
BILIRUBIN TOTAL: 0.6 mg/dL (ref 0.3–1.2)
BUN: 34 mg/dL — AB (ref 8–23)
CHLORIDE: 102 mmol/L (ref 98–111)
CO2: 27 mmol/L (ref 22–32)
Calcium: 9.7 mg/dL (ref 8.9–10.3)
Creatinine, Ser: 0.99 mg/dL (ref 0.44–1.00)
GFR calc Af Amer: 59 mL/min — ABNORMAL LOW (ref >60)
GFR, EST NON AFRICAN AMERICAN: 51 mL/min — AB (ref >60)
GLUCOSE: 206 mg/dL — AB (ref 70–99)
POTASSIUM: 3.4 mmol/L — AB (ref 3.5–5.1)
Sodium: 139 mmol/L (ref 135–145)
TOTAL PROTEIN: 7.8 g/dL (ref 6.5–8.1)

## 2018-05-08 LAB — CBC
HCT: 44.8 % (ref 36.0–46.0)
HEMOGLOBIN: 13.5 g/dL (ref 12.0–15.0)
MCH: 25.9 pg — AB (ref 26.0–34.0)
MCHC: 30.1 g/dL (ref 30.0–36.0)
MCV: 86 fL (ref 78.0–100.0)
PLATELETS: 286 10*3/uL (ref 150–400)
RBC: 5.21 MIL/uL — AB (ref 3.87–5.11)
RDW: 17.9 % — ABNORMAL HIGH (ref 11.5–15.5)
WBC: 9.9 10*3/uL (ref 4.0–10.5)

## 2018-05-08 LAB — BRAIN NATRIURETIC PEPTIDE: B NATRIURETIC PEPTIDE 5: 1105 pg/mL — AB (ref 0.0–100.0)

## 2018-05-08 LAB — TSH: TSH: 1.588 u[IU]/mL (ref 0.350–4.500)

## 2018-05-08 MED ORDER — POTASSIUM CHLORIDE CRYS ER 20 MEQ PO TBCR: 20.0000 meq | EXTENDED_RELEASE_TABLET | Freq: Every day | ORAL | 6 refills | Status: DC

## 2018-05-08 NOTE — Addendum Note (Signed)
Addended by: Scarlette Calico on: 05/08/2018 04:26 PM   Modules accepted: Orders

## 2018-05-08 NOTE — Patient Instructions (Signed)
Stop HCTZ  Start Furosemide (Lasix) 20 mg daily  Labs done today  Labs in 10-14 days  Your physician has recommended that you have a pulmonary function test. Pulmonary Function Tests are a group of tests that measure how well air moves in and out of your lungs.  Your physician recommends that you schedule a follow-up appointment in: 5-6 weeks  You are scheduled for a Cardiac Catheterization on Thursday, October 10 with Dr. Loralie Champagne.  1. Please arrive at the [facility] (Main Entrance A) at [facility]: [address] [place],  [address] at 8:30 AM (This time is two hours before your procedure to ensure your preparation). Free valet parking service is available.   Special note: Every effort is made to have your procedure done on time. Please understand that emergencies sometimes delay scheduled procedures.  2. Diet: Do not eat solid foods after midnight.  The patient may have clear liquids until 5am upon the day of the procedure.  3. Labs: You will need to have blood drawn on TODAY  4. Medication instructions in preparation for your procedure:   Contrast Allergy: No   Current Outpatient Medications (Endocrine & Metabolic):  .  metFORMIN (GLUCOPHAGE) 500 MG tablet, Take 500 mg by mouth as directed.  Current Outpatient Medications (Cardiovascular):  .  atorvastatin (LIPITOR) 40 MG tablet, Take 40 mg by mouth daily. .  carvedilol (COREG) 12.5 MG tablet, Take 1 tablet (12.5 mg total) by mouth 2 (two) times daily with a meal. .  hydrochlorothiazide (HYDRODIURIL) 25 MG tablet, Take 1 tablet (25 mg total) by mouth daily. .  sildenafil (REVATIO) 20 MG tablet, Take 1 tablet (20 mg total) by mouth 3 (three) times daily.     Current Outpatient Medications (Other):  .  baclofen (LIORESAL) 10 MG tablet, Take 10 mg by mouth 2 (two) times daily as needed for muscle spasms. .  cyclobenzaprine (FLEXERIL) 10 MG tablet, Take 1 tablet (10 mg total) by mouth 3 (three) times  daily as needed for muscle spasms. Marland Kitchen  gabapentin (NEURONTIN) 300 MG capsule, Take 300 mg by mouth 3 (three) times daily. .  mirtazapine (REMERON) 30 MG tablet, Take 30 mg by mouth at bedtime. .  pantoprazole (PROTONIX) 40 MG tablet, Take 1 tablet (40 mg total) by mouth 2 (two) times daily. .  protein supplement shake (PREMIER PROTEIN) LIQD, Take 325 mLs (11 oz total) by mouth 2 (two) times daily between meals.   DO NOT TAKE FUROSEMIDE OR METFORMIN THUR 10/9 AM  DO NOT TAKE METFORMIN Thursday 10/10, 10/11, 10/12 DO NOT RESTART THIS MEDICATION UNTIL Sunday 10/13.  On the morning of your procedure, take your Aspirin and any morning medicines NOT listed above.  You may use sips of water.  5. Plan for one night stay--bring personal belongings. 6. Bring a current list of your medications and current insurance cards. 7. You MUST have a responsible person to drive you home. 8. Someone MUST be with you the first 24 hours after you arrive home or your discharge will be delayed. 9. Please wear clothes that are easy to get on and off and wear slip-on shoes.  Thank you for allowing [place] to care for you!   -- [facility]

## 2018-05-08 NOTE — Telephone Encounter (Signed)
Notes recorded by Scarlette Calico, RN on 05/08/2018 at 4:23 PM EDT Pt aware, agreeable and verbalized understanding. Pt is not on any KCL, rx for 20 meq daily sent into pharmacy, will recheck lab 10/10

## 2018-05-08 NOTE — Telephone Encounter (Signed)
-----  Message from Larey Dresser, MD sent at 05/08/2018  4:15 PM EDT ----- Add an additional KCl 20 to total daily K.

## 2018-05-09 LAB — ENA+DNA/DS+SJORGEN'S
DS DNA AB: 1 [IU]/mL (ref 0–9)
ENA SM Ab Ser-aCnc: <0.2 AI (ref 0.0–0.9)
SSA (Ro) (ENA) Antibody, IgG: <0.2 AI (ref 0.0–0.9)

## 2018-05-09 LAB — CENTROMERE ANTIBODIES: Centromere Ab Screen: >8 AI — ABNORMAL HIGH (ref 0.0–0.9)

## 2018-05-09 LAB — ANA W/REFLEX: ANA: POSITIVE — AB

## 2018-05-09 LAB — ANTI-SCLERODERMA ANTIBODY

## 2018-05-09 LAB — RHEUMATOID FACTOR: RHEUMATOID FACTOR: 72.7 [IU]/mL — AB (ref 0.0–13.9)

## 2018-05-10 NOTE — H&P (View-Only) (Signed)
PCP: Nathan Conroy Referring: Dr. Nishan HF Cardiology: Dr. Tiasia Weberg  84 yo with history of DM, HTN, and prior osteomyelitis of jaw was referred by Dr. Nishan for evaluation of pulmonary hypertension/RV failure.  She reports an episode of PNA in 3/19 and says that she never recovered from this.  She had gradually progressive exertional dyspnea to the point where she was short of breath walking around the house.  She had an echo in 7/19 that showed mildly decreased LV systolic function but severely decreased RV systolic function with dilated RV and at least moderate pulmonary hypertension by echo estimation.  She saw her PCP in 8/19 and was noted to be hypoxemic at rest.  He started her on home oxygen 2 L/[place] which she continues.  She felt a little better on oxygen and is now able to walk farther. She still gets short of breath walking more than about 50 yards.  No orthopnea/PND.  She had an episode of syncope about 1 month ago while standing on her deck on a hot day.  She says that she was unconscious for < 1 minute.  No palpitations/sensation of racing HR.  No chest pain.  No joint pain or rash.    She was referred to cardiology, saw Dr. Nishan. Amlodipine was stopped and she was started empirically on sildenafil 20 mg tid for pulmonary hypertension.  She says she has not noted any change in her symptoms since beginning sildenafil.   ECG (personally reviewed): NSR, inferolateral TWIs  PMH: 1. H/o osteomyelitis of jaw.  2. Duodenal ulcers with upper GI bleeding. 3. Type 2 diabetes 4. Hyperlipidemia 5. Squamous cell carcinoma of scalp with radiation and surgery.  6. GERD 7. H/o back surgery  8. HTN 9. Pulmonary hypertension/RV failure: Echo (7/19) with EF 45-50%, moderate focal basal septal hypertrophy, mild diffuse hypokinesis, septal flattening, severe RV dilation with severely decreased systolic function, moderate TR, PASP 61 mmHg.   - CT chest (7/19): Mild emphysema, enlarged PA.  - V/Q scan  (8/19): No evidence for chronic PE.   SH: Lives in [place] alone, daughter lives very close.  Quit smoking > 20 years ago. No ETOH.   FH: No history of pulmonary hypertension.  Mother with "heart problems."  Sister with PPM.    ROS: All systems reviewed and negative except as per HPI.   Current Outpatient Medications  Medication Sig Dispense Refill  . atorvastatin (LIPITOR) 40 MG tablet Take 40 mg by mouth daily.    . baclofen (LIORESAL) 10 MG tablet Take 10 mg by mouth 2 (two) times daily as needed for muscle spasms.    . carvedilol (COREG) 12.5 MG tablet Take 1 tablet (12.5 mg total) by mouth 2 (two) times daily with a meal. 180 tablet 3  . cyclobenzaprine (FLEXERIL) 10 MG tablet Take 1 tablet (10 mg total) by mouth 3 (three) times daily as needed for muscle spasms. 30 tablet 0  . gabapentin (NEURONTIN) 300 MG capsule Take 300 mg by mouth 3 (three) times daily.    . hydrochlorothiazide (HYDRODIURIL) 25 MG tablet Take 1 tablet (25 mg total) by mouth daily. 90 tablet 3  . metFORMIN (GLUCOPHAGE) 500 MG tablet Take 500 mg by mouth as directed.    . mirtazapine (REMERON) 30 MG tablet Take 30 mg by mouth at bedtime.    . pantoprazole (PROTONIX) 40 MG tablet Take 1 tablet (40 mg total) by mouth 2 (two) times daily. 120 tablet 0  . protein supplement shake (PREMIER PROTEIN) LIQD   Take 325 mLs (11 oz total) by mouth 2 (two) times daily between meals. 60 Can 0  . sildenafil (REVATIO) 20 MG tablet Take 1 tablet (20 mg total) by mouth 3 (three) times daily. 90 tablet 11  . potassium chloride SA (K-DUR,KLOR-CON) 20 MEQ tablet Take 1 tablet (20 mEq total) by mouth daily. 30 tablet 6   No current facility-administered medications for this encounter.    BP 139/90   Pulse 93   Wt 63.1 kg (139 lb 2 oz)   SpO2 97% Comment: on 2L of O2  BMI 25.45 kg/m  General: NAD Neck: JVP 8 cm with HJR, no thyromegaly or thyroid nodule.  Lungs: Slight bilateral crackles.  CV: Nondisplaced PMI.  Heart regular S1/S2,  no S3/S4, no murmur.  Trace ankle edema.  No carotid bruit.  Normal pedal pulses.  Abdomen: Soft, nontender, no hepatosplenomegaly, no distention.  Skin: Intact without lesions or rashes.  Neurologic: Alert and oriented x 3.  Psych: Normal affect. Extremities: No clubbing or cyanosis.  HEENT: Normal.   Assessment/Plan: 1. Chronic diastolic CHF with prominent RV failure: Suspect RV failure is related to pulmonary hypertension based on echo with prominent septal shift and elevated PA pressure estimation.  She has NYHA class III symptoms.  On exam, she does appear at least mildly volume overloaded.  - BNP - Stop HCTZ and start Lasix 20 mg daily with BMET in 10 days.  2. Pulmonary hypertension: She appears to have at least moderate pulmonary hypertension by echo with RV failure.  CT chest showed mild emphysema, which should not explain her degree of RV failure.  V/Q scan did not show evidence for chronic PEs.  She was empirically started on sildenafil 20 mg tid without much effect so far. She remains quite symptomatic and is on home oxygen.  I am certainly concerned for primary pulmonary HTN versus PH related to rheumatologic disease in this patient.  I think we need a definitive diagnosis, however, prior to treating her more aggressively with combination therapy.  If she does indeed have group 1 PH, we should be able to help her.  - I will arrange for RHC for definitive diagnosis of pulmonary arterial hypertension (versus pulmonary venous hypertension due to LV failure, which certainly remains a concern). Will also plan LHC at the same time to rule out significant CAD as cause of her exertional symptoms. We discussed risks/benefits of cath and she and daughter agree to procedure.  - Send rheumatologic serologies today: ANA, anti-SCL 70, anti-centromere Ab, RF, TSH.   - Will arrange for PFTs as part of PH workup.  - She will need eventual sleep study.  - I will see her back after cath to discuss  potential combination therapy of PH if diagnosis of pulmonary arterial hypertension is made. Next step would likely be addition of ERA.  Will do 6 minute walk at that time.   Tyquan Carmickle 05/10/2018   

## 2018-05-10 NOTE — Progress Notes (Signed)
PCP: [name] Referring: Dr. Johnsie Cancel HF Cardiology: Dr. Aundra Dubin  84 yo with history of DM, HTN, and prior osteomyelitis of jaw was referred by Dr. Johnsie Cancel for evaluation of pulmonary hypertension/RV failure.  She reports an episode of PNA in 3/19 and says that she never recovered from this.  She had gradually progressive exertional dyspnea to the point where she was short of breath walking around the house.  She had an echo in 7/19 that showed mildly decreased LV systolic function but severely decreased RV systolic function with dilated RV and at least moderate pulmonary hypertension by echo estimation.  She saw her PCP in 8/19 and was noted to be hypoxemic at rest.  He started her on home oxygen 2 L/[place] which she continues.  She felt a little better on oxygen and is now able to walk farther. She still gets short of breath walking more than about 50 yards.  No orthopnea/PND.  She had an episode of syncope about 1 month ago while standing on her deck on a hot day.  She says that she was unconscious for < 1 minute.  No palpitations/sensation of racing HR.  No chest pain.  No joint pain or rash.    She was referred to cardiology, saw Dr. Johnsie Cancel. Amlodipine was stopped and she was started empirically on sildenafil 20 mg tid for pulmonary hypertension.  She says she has not noted any change in her symptoms since beginning sildenafil.   ECG (personally reviewed): NSR, inferolateral TWIs  PMH: 1. H/o osteomyelitis of jaw.  2. Duodenal ulcers with upper GI bleeding. 3. Type 2 diabetes 4. Hyperlipidemia 5. Squamous cell carcinoma of scalp with radiation and surgery.  6. GERD 7. H/o back surgery  8. HTN 9. Pulmonary hypertension/RV failure: Echo (7/19) with EF 45-50%, moderate focal basal septal hypertrophy, mild diffuse hypokinesis, septal flattening, severe RV dilation with severely decreased systolic function, moderate TR, PASP 61 mmHg.   - CT chest (7/19): Mild emphysema, enlarged PA.  - V/Q scan  (8/19): No evidence for chronic PE.   SH: Lives in [place] alone, daughter lives very close.  Quit smoking > 20 years ago. No ETOH.   FH: No history of pulmonary hypertension.  Mother with "heart problems."  Sister with PPM.    ROS: All systems reviewed and negative except as per HPI.   Current Outpatient Medications  Medication Sig Dispense Refill  . atorvastatin (LIPITOR) 40 MG tablet Take 40 mg by mouth daily.    . baclofen (LIORESAL) 10 MG tablet Take 10 mg by mouth 2 (two) times daily as needed for muscle spasms.    . carvedilol (COREG) 12.5 MG tablet Take 1 tablet (12.5 mg total) by mouth 2 (two) times daily with a meal. 180 tablet 3  . cyclobenzaprine (FLEXERIL) 10 MG tablet Take 1 tablet (10 mg total) by mouth 3 (three) times daily as needed for muscle spasms. 30 tablet 0  . gabapentin (NEURONTIN) 300 MG capsule Take 300 mg by mouth 3 (three) times daily.    . hydrochlorothiazide (HYDRODIURIL) 25 MG tablet Take 1 tablet (25 mg total) by mouth daily. 90 tablet 3  . metFORMIN (GLUCOPHAGE) 500 MG tablet Take 500 mg by mouth as directed.    . mirtazapine (REMERON) 30 MG tablet Take 30 mg by mouth at bedtime.    . pantoprazole (PROTONIX) 40 MG tablet Take 1 tablet (40 mg total) by mouth 2 (two) times daily. 120 tablet 0  . protein supplement shake (PREMIER PROTEIN) LIQD  Take 325 mLs (11 oz total) by mouth 2 (two) times daily between meals. 60 Can 0  . sildenafil (REVATIO) 20 MG tablet Take 1 tablet (20 mg total) by mouth 3 (three) times daily. 90 tablet 11  . potassium chloride SA (K-DUR,KLOR-CON) 20 MEQ tablet Take 1 tablet (20 mEq total) by mouth daily. 30 tablet 6   No current facility-administered medications for this encounter.    BP 139/90   Pulse 93   Wt 63.1 kg (139 lb 2 oz)   SpO2 97% Comment: on 2L of O2  BMI 25.45 kg/m  General: NAD Neck: JVP 8 cm with HJR, no thyromegaly or thyroid nodule.  Lungs: Slight bilateral crackles.  CV: Nondisplaced PMI.  Heart regular S1/S2,  no S3/S4, no murmur.  Trace ankle edema.  No carotid bruit.  Normal pedal pulses.  Abdomen: Soft, nontender, no hepatosplenomegaly, no distention.  Skin: Intact without lesions or rashes.  Neurologic: Alert and oriented x 3.  Psych: Normal affect. Extremities: No clubbing or cyanosis.  HEENT: Normal.   Assessment/Plan: 1. Chronic diastolic CHF with prominent RV failure: Suspect RV failure is related to pulmonary hypertension based on echo with prominent septal shift and elevated PA pressure estimation.  She has NYHA class III symptoms.  On exam, she does appear at least mildly volume overloaded.  - BNP - Stop HCTZ and start Lasix 20 mg daily with BMET in 10 days.  2. Pulmonary hypertension: She appears to have at least moderate pulmonary hypertension by echo with RV failure.  CT chest showed mild emphysema, which should not explain her degree of RV failure.  V/Q scan did not show evidence for chronic PEs.  She was empirically started on sildenafil 20 mg tid without much effect so far. She remains quite symptomatic and is on home oxygen.  I am certainly concerned for primary pulmonary HTN versus PH related to rheumatologic disease in this patient.  I think we need a definitive diagnosis, however, prior to treating her more aggressively with combination therapy.  If she does indeed have group 1 PH, we should be able to help her.  - I will arrange for RHC for definitive diagnosis of pulmonary arterial hypertension (versus pulmonary venous hypertension due to LV failure, which certainly remains a concern). Will also plan LHC at the same time to rule out significant CAD as cause of her exertional symptoms. We discussed risks/benefits of cath and she and daughter agree to procedure.  - Send rheumatologic serologies today: ANA, anti-SCL 70, anti-centromere Ab, RF, TSH.   - Will arrange for PFTs as part of [place] workup.  - She will need eventual sleep study.  - I will see her back after cath to discuss  potential combination therapy of PH if diagnosis of pulmonary arterial hypertension is made. Next step would likely be addition of ERA.  Will do 6 minute walk at that time.   Loralie Champagne 05/10/2018

## 2018-05-11 ENCOUNTER — Other Ambulatory Visit: Payer: Self-pay

## 2018-05-11 NOTE — Patient Outreach (Signed)
[facility]) Care Management  05/11/2018  [name] 1932-07-10 [id number]   Medication Adherence call to [name] patient did not answer patient is due on Olmesartan/ Hctz 40/25 mg and Metformin Er 500 mg last month she said doctor took her off both medication. [name] is showing past due under [place].    [place] Management Direct Dial [phone]  Fax [phone] [email or web address]

## 2018-05-13 ENCOUNTER — Encounter (HOSPITAL_COMMUNITY): Payer: Self-pay | Admitting: Cardiology

## 2018-05-14 ENCOUNTER — Other Ambulatory Visit: Payer: Self-pay

## 2018-05-14 ENCOUNTER — Inpatient Hospital Stay (HOSPITAL_COMMUNITY)
Admission: RE | Disposition: A | Discharge status: Home / Self Care | Payer: Self-pay | Source: Ambulatory Visit | Attending: Cardiology

## 2018-05-14 ENCOUNTER — Inpatient Hospital Stay (HOSPITAL_COMMUNITY)
Admission: RE | Admit: 2018-05-14 | Discharge: 2018-05-16 | DRG: 247 | Disposition: A | Discharge status: Home / Self Care | Payer: Medicare Other | Source: Ambulatory Visit | Attending: Cardiology | Admitting: Cardiology

## 2018-05-14 ENCOUNTER — Inpatient Hospital Stay (HOSPITAL_COMMUNITY): Payer: Medicare Other

## 2018-05-14 DIAGNOSIS — K219 Gastro-esophageal reflux disease without esophagitis: Secondary | ICD-10-CM | POA: Diagnosis present

## 2018-05-14 DIAGNOSIS — Z87891 Personal history of nicotine dependence: Secondary | ICD-10-CM | POA: Diagnosis not present

## 2018-05-14 DIAGNOSIS — Z882 Allergy status to sulfonamides status: Secondary | ICD-10-CM | POA: Diagnosis not present

## 2018-05-14 DIAGNOSIS — R0600 Dyspnea, unspecified: Secondary | ICD-10-CM | POA: Diagnosis not present

## 2018-05-14 DIAGNOSIS — Z85828 Personal history of other malignant neoplasm of skin: Secondary | ICD-10-CM

## 2018-05-14 DIAGNOSIS — Z7984 Long term (current) use of oral hypoglycemic drugs: Secondary | ICD-10-CM

## 2018-05-14 DIAGNOSIS — I25119 Atherosclerotic heart disease of native coronary artery with unspecified angina pectoris: Secondary | ICD-10-CM | POA: Diagnosis not present

## 2018-05-14 DIAGNOSIS — D649 Anemia, unspecified: Secondary | ICD-10-CM | POA: Diagnosis not present

## 2018-05-14 DIAGNOSIS — Z955 Presence of coronary angioplasty implant and graft: Secondary | ICD-10-CM

## 2018-05-14 DIAGNOSIS — I5032 Chronic diastolic (congestive) heart failure: Secondary | ICD-10-CM | POA: Diagnosis not present

## 2018-05-14 DIAGNOSIS — Z9981 Dependence on supplemental oxygen: Secondary | ICD-10-CM | POA: Diagnosis not present

## 2018-05-14 DIAGNOSIS — J439 Emphysema, unspecified: Secondary | ICD-10-CM | POA: Diagnosis present

## 2018-05-14 DIAGNOSIS — Y92234 Operating room of hospital as the place of occurrence of the external cause: Secondary | ICD-10-CM | POA: Diagnosis not present

## 2018-05-14 DIAGNOSIS — E785 Hyperlipidemia, unspecified: Secondary | ICD-10-CM | POA: Diagnosis present

## 2018-05-14 DIAGNOSIS — I272 Pulmonary hypertension, unspecified: Secondary | ICD-10-CM

## 2018-05-14 DIAGNOSIS — I110 Hypertensive heart disease with heart failure: Secondary | ICD-10-CM | POA: Diagnosis not present

## 2018-05-14 DIAGNOSIS — I2510 Atherosclerotic heart disease of native coronary artery without angina pectoris: Secondary | ICD-10-CM | POA: Diagnosis not present

## 2018-05-14 DIAGNOSIS — Y838 Other surgical procedures as the cause of abnormal reaction of the patient, or of later complication, without mention of misadventure at the time of the procedure: Secondary | ICD-10-CM | POA: Diagnosis not present

## 2018-05-14 DIAGNOSIS — Y718 Miscellaneous cardiovascular devices associated with adverse incidents, not elsewhere classified: Secondary | ICD-10-CM | POA: Diagnosis not present

## 2018-05-14 DIAGNOSIS — R0902 Hypoxemia: Secondary | ICD-10-CM | POA: Diagnosis not present

## 2018-05-14 DIAGNOSIS — Z923 Personal history of irradiation: Secondary | ICD-10-CM | POA: Diagnosis not present

## 2018-05-14 DIAGNOSIS — R0609 Other forms of dyspnea: Secondary | ICD-10-CM | POA: Diagnosis not present

## 2018-05-14 DIAGNOSIS — E119 Type 2 diabetes mellitus without complications: Secondary | ICD-10-CM | POA: Diagnosis not present

## 2018-05-14 DIAGNOSIS — I97638 Postprocedural hematoma of a circulatory system organ or structure following other circulatory system procedure: Secondary | ICD-10-CM | POA: Diagnosis not present

## 2018-05-14 DIAGNOSIS — I50810 Right heart failure, unspecified: Secondary | ICD-10-CM | POA: Diagnosis present

## 2018-05-14 DIAGNOSIS — Z79899 Other long term (current) drug therapy: Secondary | ICD-10-CM | POA: Diagnosis not present

## 2018-05-14 DIAGNOSIS — I2721 Secondary pulmonary arterial hypertension: Secondary | ICD-10-CM | POA: Diagnosis not present

## 2018-05-14 DIAGNOSIS — J811 Chronic pulmonary edema: Secondary | ICD-10-CM | POA: Diagnosis not present

## 2018-05-14 HISTORY — PX: RIGHT/LEFT HEART CATH AND CORONARY ANGIOGRAPHY: CATH118266

## 2018-05-14 HISTORY — DX: Atherosclerotic heart disease of native coronary artery without angina pectoris: I25.10

## 2018-05-14 LAB — POCT I-STAT 3, VENOUS BLOOD GAS (G3P V)
Acid-base deficit: 1 mmol/L (ref 0.0–2.0)
Acid-base deficit: 1 mmol/L (ref 0.0–2.0)
BICARBONATE: 25 mmol/L (ref 20.0–28.0)
Bicarbonate: 25.3 mmol/L (ref 20.0–28.0)
O2 SAT: 49 %
O2 Saturation: 51 %
PCO2 VEN: 47.1 mmHg (ref 44.0–60.0)
PCO2 VEN: 47.3 mmHg (ref 44.0–60.0)
PH VEN: 7.333 (ref 7.250–7.430)
PH VEN: 7.336 (ref 7.250–7.430)
PO2 VEN: 28 mmHg — AB (ref 32.0–45.0)
TCO2: 26 mmol/L (ref 22–32)
TCO2: 27 mmol/L (ref 22–32)
pO2, Ven: 29 mmHg — CL (ref 32.0–45.0)

## 2018-05-14 LAB — CBC
HCT: 36.7 % (ref 36.0–46.0)
Hemoglobin: 11 g/dL — ABNORMAL LOW (ref 12.0–15.0)
MCH: 25.8 pg — AB (ref 26.0–34.0)
MCHC: 30 g/dL (ref 30.0–36.0)
MCV: 85.9 fL (ref 80.0–100.0)
PLATELETS: 210 10*3/uL (ref 150–400)
RBC: 4.27 MIL/uL (ref 3.87–5.11)
RDW: 17.9 % — AB (ref 11.5–15.5)
WBC: 5.2 10*3/uL (ref 4.0–10.5)
nRBC: 0 % (ref 0.0–0.2)

## 2018-05-14 LAB — BASIC METABOLIC PANEL
Anion gap: 7 (ref 5–15)
BUN: 19 mg/dL (ref 8–23)
CALCIUM: 7.4 mg/dL — AB (ref 8.9–10.3)
CO2: 23 mmol/L (ref 22–32)
CREATININE: 0.63 mg/dL (ref 0.44–1.00)
Chloride: 109 mmol/L (ref 98–111)
GLUCOSE: 164 mg/dL — AB (ref 70–99)
Potassium: 2.6 mmol/L — CL (ref 3.5–5.1)
Sodium: 139 mmol/L (ref 135–145)

## 2018-05-14 LAB — GLUCOSE, CAPILLARY
GLUCOSE-CAPILLARY: 156 mg/dL — AB (ref 70–99)
GLUCOSE-CAPILLARY: 182 mg/dL — AB (ref 70–99)
GLUCOSE-CAPILLARY: 144 mg/dL — AB (ref 70–99)

## 2018-05-14 LAB — POCT ACTIVATED CLOTTING TIME: ACTIVATED CLOTTING TIME: 158 s

## 2018-05-14 SURGERY — RIGHT/LEFT HEART CATH AND CORONARY ANGIOGRAPHY
Anesthesia: LOCAL

## 2018-05-14 MED ORDER — ONDANSETRON HCL 4 MG/2ML IJ SOLN
4.0000 mg | Freq: Four times a day (QID) | INTRAMUSCULAR | Status: DC | PRN
  Filled 2018-05-14: qty 2

## 2018-05-14 MED ORDER — ASPIRIN 81 MG PO CHEW
81.0000 mg | CHEWABLE_TABLET | ORAL | Status: AC
  Administered 2018-05-14: 81 mg via ORAL
  Filled 2018-05-14: qty 1

## 2018-05-14 MED ORDER — ACETAMINOPHEN 325 MG PO TABS
650.0000 mg | ORAL_TABLET | ORAL | Status: DC | PRN
  Filled 2018-05-14: qty 2

## 2018-05-14 MED ORDER — POTASSIUM CHLORIDE CRYS ER 20 MEQ PO TBCR
40.0000 meq | EXTENDED_RELEASE_TABLET | ORAL | Status: AC
  Administered 2018-05-14 (×2): 40 meq via ORAL
  Filled 2018-05-14 (×2): qty 2

## 2018-05-14 MED ORDER — HEPARIN (PORCINE) IN NACL 1000-0.9 UT/500ML-% IV SOLN
INTRAVENOUS | Status: AC
  Filled 2018-05-14: qty 1000

## 2018-05-14 MED ORDER — NITROGLYCERIN IN D5W 200-5 MCG/ML-% IV SOLN
INTRAVENOUS | Status: AC
  Filled 2018-05-14: qty 250

## 2018-05-14 MED ORDER — PANTOPRAZOLE SODIUM 40 MG PO TBEC
40.0000 mg | DELAYED_RELEASE_TABLET | Freq: Two times a day (BID) | ORAL | Status: DC
  Administered 2018-05-14 – 2018-05-16 (×4): 40 mg via ORAL
  Filled 2018-05-14 (×4): qty 1

## 2018-05-14 MED ORDER — LIDOCAINE HCL (PF) 1 % IJ SOLN
INTRAMUSCULAR | Status: AC
  Filled 2018-05-14: qty 30

## 2018-05-14 MED ORDER — MACITENTAN 10 MG PO TABS
10.0000 mg | ORAL_TABLET | Freq: Every day | ORAL | Status: DC
  Administered 2018-05-14 – 2018-05-16 (×3): 10 mg via ORAL
  Filled 2018-05-14 (×5): qty 1

## 2018-05-14 MED ORDER — GABAPENTIN 300 MG PO CAPS
300.0000 mg | ORAL_CAPSULE | Freq: Three times a day (TID) | ORAL | Status: DC
  Administered 2018-05-14 – 2018-05-16 (×6): 300 mg via ORAL
  Filled 2018-05-14 (×6): qty 1

## 2018-05-14 MED ORDER — NITROGLYCERIN 1 MG/10 ML FOR IR/CATH LAB
INTRA_ARTERIAL | Status: DC | PRN
  Administered 2018-05-14: 200 ug

## 2018-05-14 MED ORDER — HEPARIN SODIUM (PORCINE) 1000 UNIT/ML IJ SOLN
INTRAMUSCULAR | Status: DC | PRN
  Administered 2018-05-14: 3000 [IU] via INTRAVENOUS

## 2018-05-14 MED ORDER — NITROGLYCERIN 1 MG/10 ML FOR IR/CATH LAB
INTRA_ARTERIAL | Status: AC
  Filled 2018-05-14: qty 10

## 2018-05-14 MED ORDER — HEPARIN SODIUM (PORCINE) 1000 UNIT/ML IJ SOLN
INTRAMUSCULAR | Status: AC
  Filled 2018-05-14: qty 1

## 2018-05-14 MED ORDER — HEPARIN (PORCINE) IN NACL 1000-0.9 UT/500ML-% IV SOLN
INTRAVENOUS | Status: DC | PRN
  Administered 2018-05-14: 500 mL

## 2018-05-14 MED ORDER — SODIUM CHLORIDE 0.9% FLUSH: 3.0000 mL | INTRAVENOUS | Status: DC | PRN

## 2018-05-14 MED ORDER — FENTANYL CITRATE (PF) 100 MCG/2ML IJ SOLN
INTRAMUSCULAR | Status: AC
  Filled 2018-05-14: qty 2

## 2018-05-14 MED ORDER — SODIUM CHLORIDE 0.9 % WEIGHT BASED INFUSION: 1.0000 mL/kg/h | INTRAVENOUS | Status: AC

## 2018-05-14 MED ORDER — SODIUM CHLORIDE 0.9% FLUSH: 3.0000 mL | Freq: Two times a day (BID) | INTRAVENOUS | Status: DC

## 2018-05-14 MED ORDER — INSULIN ASPART 100 UNIT/ML ~~LOC~~ SOLN
0.0000 [IU] | Freq: Three times a day (TID) | SUBCUTANEOUS | Status: DC
  Administered 2018-05-14: 18:00:00 3 [IU] via SUBCUTANEOUS
  Administered 2018-05-15: 19:00:00 2 [IU] via SUBCUTANEOUS
  Administered 2018-05-15 – 2018-05-16 (×2): 3 [IU] via SUBCUTANEOUS

## 2018-05-14 MED ORDER — IOHEXOL 350 MG/ML SOLN
INTRAVENOUS | Status: DC | PRN
  Administered 2018-05-14: 70 mL via INTRACARDIAC

## 2018-05-14 MED ORDER — SILDENAFIL CITRATE 20 MG PO TABS
20.0000 mg | ORAL_TABLET | Freq: Three times a day (TID) | ORAL | Status: DC
  Administered 2018-05-14: 20 mg via ORAL
  Filled 2018-05-14 (×2): qty 1

## 2018-05-14 MED ORDER — INSULIN ASPART 100 UNIT/ML ~~LOC~~ SOLN: 0.0000 [IU] | Freq: Every day | SUBCUTANEOUS | Status: DC

## 2018-05-14 MED ORDER — VERAPAMIL HCL 2.5 MG/ML IV SOLN
INTRAVENOUS | Status: AC
  Filled 2018-05-14: qty 2

## 2018-05-14 MED ORDER — MIDAZOLAM HCL 2 MG/2ML IJ SOLN
INTRAMUSCULAR | Status: AC
  Filled 2018-05-14: qty 2

## 2018-05-14 MED ORDER — MIDAZOLAM HCL 2 MG/2ML IJ SOLN
INTRAMUSCULAR | Status: DC | PRN
  Administered 2018-05-14: 0.5 mg via INTRAVENOUS
  Administered 2018-05-14 (×2): 1 mg via INTRAVENOUS

## 2018-05-14 MED ORDER — CLOPIDOGREL BISULFATE 75 MG PO TABS
300.0000 mg | ORAL_TABLET | Freq: Once | ORAL | Status: AC
  Administered 2018-05-14: 16:00:00 300 mg via ORAL

## 2018-05-14 MED ORDER — NITROGLYCERIN IN D5W 200-5 MCG/ML-% IV SOLN
3.0000 ug/min | INTRAVENOUS | Status: DC
  Administered 2018-05-14: 19:00:00 3 ug/min via INTRAVENOUS

## 2018-05-14 MED ORDER — ASPIRIN 81 MG PO CHEW
81.0000 mg | CHEWABLE_TABLET | ORAL | Status: AC
  Administered 2018-05-15: 06:00:00 81 mg via ORAL
  Filled 2018-05-14: qty 1

## 2018-05-14 MED ORDER — SODIUM CHLORIDE 0.9 % WEIGHT BASED INFUSION: 1.0000 mL/kg/h | INTRAVENOUS | Status: DC

## 2018-05-14 MED ORDER — SODIUM CHLORIDE 0.9 % WEIGHT BASED INFUSION
3.0000 mL/kg/h | INTRAVENOUS | Status: DC
  Administered 2018-05-15: 04:00:00 3 mL/kg/h via INTRAVENOUS

## 2018-05-14 MED ORDER — FENTANYL CITRATE (PF) 100 MCG/2ML IJ SOLN
INTRAMUSCULAR | Status: DC | PRN
  Administered 2018-05-14 (×3): 25 ug via INTRAVENOUS

## 2018-05-14 MED ORDER — SODIUM CHLORIDE 0.9 % IV SOLN
INTRAVENOUS | Status: DC
  Administered 2018-05-14: 10:00:00 via INTRAVENOUS

## 2018-05-14 MED ORDER — CLOPIDOGREL BISULFATE 75 MG PO TABS
75.0000 mg | ORAL_TABLET | Freq: Every day | ORAL | Status: DC
  Administered 2018-05-15 – 2018-05-16 (×2): 75 mg via ORAL
  Filled 2018-05-14 (×2): qty 1

## 2018-05-14 MED ORDER — SODIUM CHLORIDE 0.9 % IV SOLN: 250.0000 mL | INTRAVENOUS | Status: DC | PRN

## 2018-05-14 MED ORDER — MIRTAZAPINE 30 MG PO TABS
30.0000 mg | ORAL_TABLET | Freq: Every day | ORAL | Status: DC
  Administered 2018-05-14 – 2018-05-15 (×2): 30 mg via ORAL
  Filled 2018-05-14 (×3): qty 1

## 2018-05-14 MED ORDER — VERAPAMIL HCL 2.5 MG/ML IV SOLN
INTRAVENOUS | Status: DC | PRN
  Administered 2018-05-14: 10 mL via INTRA_ARTERIAL

## 2018-05-14 MED ORDER — ALPRAZOLAM 0.25 MG PO TABS
0.2500 mg | ORAL_TABLET | Freq: Once | ORAL | Status: AC
  Administered 2018-05-14: 19:00:00 0.25 mg via ORAL
  Filled 2018-05-14: qty 1

## 2018-05-14 MED ORDER — ASPIRIN 81 MG PO CHEW
81.0000 mg | CHEWABLE_TABLET | Freq: Every day | ORAL | Status: DC
  Administered 2018-05-16: 81 mg via ORAL
  Filled 2018-05-14: qty 1

## 2018-05-14 MED ORDER — CARVEDILOL 12.5 MG PO TABS
12.5000 mg | ORAL_TABLET | Freq: Two times a day (BID) | ORAL | Status: DC
  Administered 2018-05-14 – 2018-05-16 (×4): 12.5 mg via ORAL
  Filled 2018-05-14 (×4): qty 1

## 2018-05-14 MED ORDER — LIDOCAINE HCL (PF) 1 % IJ SOLN
INTRAMUSCULAR | Status: DC | PRN
  Administered 2018-05-14: 20 mL
  Administered 2018-05-14: 10 mL
  Administered 2018-05-14 (×2): 2 mL

## 2018-05-14 MED ORDER — HEPARIN SODIUM (PORCINE) 5000 UNIT/ML IJ SOLN
5000.0000 [IU] | Freq: Three times a day (TID) | INTRAMUSCULAR | Status: DC
  Administered 2018-05-15: 5000 [IU] via SUBCUTANEOUS
  Filled 2018-05-14: qty 1

## 2018-05-14 MED ORDER — ATORVASTATIN CALCIUM 80 MG PO TABS
80.0000 mg | ORAL_TABLET | Freq: Every day | ORAL | Status: DC
  Administered 2018-05-14 – 2018-05-15 (×2): 80 mg via ORAL
  Filled 2018-05-14 (×2): qty 1

## 2018-05-14 SURGICAL SUPPLY — 21 items
CATH 5FR JL3.5 JR4 ANG PIG MP (CATHETERS) ×2 IMPLANT
CATH BALLN WEDGE 5F 110CM (CATHETERS) ×2 IMPLANT
CATH INFINITI 5 FR 3DRC (CATHETERS) ×2 IMPLANT
CATH INFINITI MULTIPACK ANG 4F (CATHETERS) ×2 IMPLANT
CATH SWAN GANZ 7F STRAIGHT (CATHETERS) ×2 IMPLANT
DEVICE RAD COMP TR BAND LRG (VASCULAR PRODUCTS) ×2 IMPLANT
GLIDESHEATH SLEND SS 6F .021 (SHEATH) ×2 IMPLANT
GUIDEWIRE .025 260CM (WIRE) ×2 IMPLANT
GUIDEWIRE INQWIRE 1.5J.035X260 (WIRE) ×1 IMPLANT
INQWIRE 1.5J .035X260CM (WIRE) ×2
KIT ENCORE 26 ADVANTAGE (KITS) IMPLANT
KIT HEART LEFT (KITS) ×2 IMPLANT
PACK CARDIAC CATHETERIZATION (CUSTOM PROCEDURE TRAY) ×2 IMPLANT
SHEATH GLIDE SLENDER 4/5FR (SHEATH) ×2 IMPLANT
SHEATH PINNACLE 5F 10CM (SHEATH) ×2 IMPLANT
SHEATH PINNACLE 7F 10CM (SHEATH) ×2 IMPLANT
SHEATH PROBE COVER 6X72 (BAG) ×2 IMPLANT
TRANSDUCER W/STOPCOCK (MISCELLANEOUS) ×2 IMPLANT
TUBING CIL FLEX 10 FLL-RA (TUBING) ×2 IMPLANT
WIRE EMERALD 3MM-J .035X150CM (WIRE) ×2 IMPLANT
WIRE HI TORQ VERSACORE-J 145CM (WIRE) ×2 IMPLANT

## 2018-05-14 NOTE — Progress Notes (Signed)
TR BAND REMOVAL  LOCATION:  right radial  DEFLATED PER PROTOCOL:  Yes.    TIME BAND OFF / DRESSING APPLIED:   1800   SITE UPON ARRIVAL:   Level 0  SITE AFTER BAND REMOVAL:  Level 0  CIRCULATION SENSATION AND MOVEMENT:  Within Normal Limits  Yes.    COMMENTS:

## 2018-05-14 NOTE — Interval H&P Note (Signed)
History and Physical Interval Note:  05/14/2018 10:58 AM  [name]  has presented today for surgery, with the diagnosis of hf - hp  The various methods of treatment have been discussed with the patient and family. After consideration of risks, benefits and other options for treatment, the patient has consented to  Procedure(s): RIGHT/LEFT HEART CATH AND CORONARY ANGIOGRAPHY (N/A) as a surgical intervention .  The patient's history has been reviewed, patient examined, no change in status, stable for surgery.  I have reviewed the patient's chart and labs.  Questions were answered to the patient's satisfaction.     Jenesa Foresta [employer]

## 2018-05-14 NOTE — Progress Notes (Signed)
Site area: RFA/RFV Site Prior to Removal:  Level 0 Pressure Applied For: 30 min Manual:   yes Patient Status During Pull:  stable Post Pull Site:  Level 0 Post Pull Instructions Given:  yes Post Pull Pulses Present: weak rt DP Dressing Applied:  clear Bedrest begins @ 1415 till 1815 Comments:

## 2018-05-14 NOTE — H&P (Signed)
Advanced Heart Failure Team History and Physical Note   PCP:  [name], PA-C  PCP-Cardiology: No primary care provider on file.     Reason for Admission: Post-cath for PCI with atherectomy tomorrow.    HPI:    84 yo with history of DM, HTN, and prior osteomyelitis of jaw was initially referred by Dr. Johnsie Cancel for evaluation of pulmonary hypertension/RV failure.  She reports an episode of PNA in 3/19 and says that she never recovered from this.  She has had gradually progressive exertional dyspnea to the point where she has been short of breath just walking around the house.  She had an echo in 7/19 that showed mildly decreased LV systolic function but severely decreased RV systolic function with dilated RV and at least moderate pulmonary hypertension by echo estimation.  She saw her PCP in 8/19 and was noted to be hypoxemic at rest.  He started her on home oxygen 2 L/[place] which she continues.  She felt a little better on oxygen and is now able to walk farther. She still gets markedly short of breath walking more than about 50 yards.  No orthopnea/PND.  She had an episode of syncope about 1 month ago while standing on her deck on a hot day.  She says that she was unconscious for < 1 minute.  No palpitations/sensation of racing HR.  She has mild chest pressure associated with dyspnea but no severe pain.  No joint pain or rash.    She was referred to cardiology, saw Dr. Johnsie Cancel. Amlodipine was stopped and she was started empirically on sildenafil 20 mg tid for pulmonary hypertension.  She says she has not noted any change in her symptoms since beginning sildenafil.   I then saw her in HF clinic.  She still reported quite marked exertional dyspnea.  Based on symptoms and to make a definitive diagnosis of [place] in order to treat her, I set her up for RHC.  I planned to do coronary angiography also given the severity of her exertional symptoms and some chest heaviness, the LV EF was also mildly  decreased.   Cath was done today as below.  Case discussed with Dr. Ellyn Hack, plan for admission overnight then complex PCI to proximal LAD/diagonal bifurcation lesion with atherectomy.    RHC/LHC (05/14/18) Coronary Findings   Diagnostic  Dominance: Right  Left Main  20% distal left main narrowing.  Left Anterior Descending  Complicated, calcified lesion at the bifurcation of the proximal LAD and a large 1st diagonal. There is 95% stenosis in the LAD at the bifurcation and 95% stenosis in the large D2 at the bifurcation.  Left Circumflex  Luminal irregularities.  Right Coronary Artery  30% mid RCA, 30% distal RCA.  Intervention   No interventions have been documented.  Right Heart   Right Heart Pressures RHC Procedural Findings: Hemodynamics (mmHg) RA mean 7 RV 64/11 PA 64/21, mean 38 PCWP mean 6 LV 140/5 AO 144/72  Oxygen saturations: PA 50% AO 93%  Cardiac Output (Fick) 2.76  Cardiac Index (Fick) 1.7 PVR 11.6 WU  Cardiac Output (Thermo) 3.07 Cardiac Index (Thermo) 1.9  PVR 10.4 WU      Review of Systems: All systems reviewed and negative except as per HPI  Home Medications Prior to Admission medications   Medication Sig Start Date End Date Taking? Authorizing Provider  ALPRAZolam Duanne Moron) 0.25 MG tablet Take 0.25 mg by mouth daily as needed for anxiety.    Yes [provider]  atorvastatin (LIPITOR) 40 MG tablet Take 40 mg by mouth daily.   Yes [provider]  baclofen (LIORESAL) 10 MG tablet Take 10 mg by mouth 2 (two) times daily.    Yes [provider]  carvedilol (COREG) 12.5 MG tablet Take 1 tablet (12.5 mg total) by mouth 2 (two) times daily with a meal. 03/06/18  Yes Josue Hector, MD  chlorhexidine (PERIDEX) 0.12 % solution 15 mLs by Mouth Rinse route 2 (two) times daily as needed (sore gums).  05/05/18  Yes [provider]  cyclobenzaprine (FLEXERIL) 10 MG tablet Take 1 tablet (10 mg total) by mouth 3 (three) times  daily as needed for muscle spasms. Patient taking differently: Take 10 mg by mouth 2 (two) times daily.  10/21/14  Yes Leonard Schwartz, MD  gabapentin (NEURONTIN) 300 MG capsule Take 300 mg by mouth at bedtime.    Yes [provider]  hydrochlorothiazide (HYDRODIURIL) 25 MG tablet Take 1 tablet (25 mg total) by mouth daily. 03/06/18  Yes Josue Hector, MD  lactose free nutrition (BOOST) LIQD Take 237 mLs by mouth daily at 2 PM.   Yes [provider]  mirtazapine (REMERON) 30 MG tablet Take 30 mg by mouth at bedtime.   Yes [provider]  pantoprazole (PROTONIX) 40 MG tablet Take 1 tablet (40 mg total) by mouth 2 (two) times daily. Patient taking differently: Take 40 mg by mouth daily.  01/02/17  Yes Ghimire, Henreitta Leber, MD  potassium chloride SA (K-DUR,KLOR-CON) 20 MEQ tablet Take 1 tablet (20 mEq total) by mouth daily. 05/08/18  Yes Larey Dresser, MD  sildenafil (REVATIO) 20 MG tablet Take 1 tablet (20 mg total) by mouth 3 (three) times daily. 03/06/18  Yes Josue Hector, MD  traMADol-acetaminophen (ULTRACET) 37.5-325 MG tablet Take 2 tablets by mouth 4 (four) times daily as needed for pain. 05/07/18  Yes [provider]    Past Medical History: 1. H/o osteomyelitis of jaw.  2. Duodenal ulcers with upper GI bleeding. 3. Type 2 diabetes 4. Hyperlipidemia 5. Squamous cell carcinoma of scalp with radiation and surgery.  6. GERD 7. H/o back surgery  8. HTN 9. Pulmonary hypertension/RV failure: Echo (7/19) with EF 45-50%, moderate focal basal septal hypertrophy, mild diffuse hypokinesis, septal flattening, severe RV dilation with severely decreased systolic function, moderate TR, PASP 61 mmHg.   - CT chest (7/19): Mild emphysema, enlarged PA.  - V/Q scan (8/19): No evidence for chronic PE.   Past Surgical History: Past Surgical History:  Procedure Laterality Date  . ABDOMINAL HYSTERECTOMY    . APPENDECTOMY    . BACK SURGERY    . CATARACT EXTRACTION,  BILATERAL Bilateral   . CHOLECYSTECTOMY    . COLON SURGERY    . COLONOSCOPY    . ESOPHAGOGASTRODUODENOSCOPY (EGD) WITH ESOPHAGEAL DILATION  "several times"  . ESOPHAGOGASTRODUODENOSCOPY (EGD) WITH PROPOFOL N/A 12/31/2016   Procedure: ESOPHAGOGASTRODUODENOSCOPY (EGD) WITH PROPOFOL;  Surgeon: Wilford Corner, MD;  Location: [facility];  Service: Endoscopy;  Laterality: N/A;  . EYE SURGERY    . FRACTURE SURGERY    . IR FLUORO GUIDE CV LINE RIGHT  01/02/2017  . IR US GUIDE VASC ACCESS RIGHT  01/02/2017  . JOINT REPLACEMENT    . LUMBAR DISC SURGERY  10/25/2014   Right L4-L5 removal of seven free fragments of disk mostly posterolaterally.  Lysis of adhesion.  Microscope/notes 10/26/2014  . LUMBAR LAMINECTOMY/DECOMPRESSION MICRODISCECTOMY Right 08/09/2014   Procedure: Right Lumbar Four to Five Microdiskectomy;  Surgeon: Floyce Stakes, MD;  Location[facility];  Service: Neurosurgery;  Laterality: Right;  Right L4-5 Microdiskectomy  . ORIF PERIPROSTHETIC FRACTURE  03/31/2012   Procedure: OPEN REDUCTION INTERNAL FIXATION (ORIF) PERIPROSTHETIC FRACTURE;  Surgeon: Mauri Pole, MD;  Location: [facility] ORS;  Service: Orthopedics;  Laterality: Left;  Open reduction internal fixation Left distal femur periprosthetic fracture  . SMALL INTESTINE SURGERY  2011   "really bad blockage; went into respiratory distress and was on ventilator after surgery; [facility]"  . SQUAMOUS CELL CARCINOMA EXCISION  ~ 2012   S/P "cut off her head then 15 radiation txs"   . TOTAL KNEE ARTHROPLASTY Bilateral    bilateral    Family History:  No history of pulmonary hypertension.  Mother with "heart problems."  Sister with PPM.    Social History: Social History   Socioeconomic History  . Marital status: Widowed    Spouse name: Not on file  . Number of children: Not on file  . Years of education: Not on file  . Highest education level: Not on file  Occupational History  . Not on file  Social Needs  . Financial  resource strain: Not on file  . Food insecurity:    Worry: Not on file    Inability: Not on file  . Transportation needs:    Medical: Not on file    Non-medical: Not on file  Tobacco Use  . Smoking status: Former Smoker    Packs/day: 0.10    Years: 10.00    Pack years: 1.00    Types: Cigarettes    Last attempt to quit: 03/31/1991    Years since quitting: 27.1  . Smokeless tobacco: Never Used  Substance and Sexual Activity  . Alcohol use: No  . Drug use: No  . Sexual activity: Never  Lifestyle  . Physical activity:    Days per week: Not on file    Minutes per session: Not on file  . Stress: Not on file  Relationships  . Social connections:    Talks on phone: Not on file    Gets together: Not on file    Attends religious service: Not on file    Active member of club or organization: Not on file    Attends meetings of clubs or organizations: Not on file    Relationship status: Not on file  Other Topics Concern  . Not on file  Social History Narrative  . Not on file    Allergies:  Allergies  Allergen Reactions  . Sulfa Antibiotics Swelling    Mouth and tongue swelling    Objective:    Vital Signs:   Temp:  [98.1 F (36.7 C)] 98.1 F (36.7 C) (10/10 0835) Pulse Rate:  [62-80] 63 (10/10 1415) Resp:  [6-26] 14 (10/10 1415) BP: (136-183)/(62-109) 157/90 (10/10 1410) SpO2:  [0 %-99 %] 94 % (10/10 1415) Weight:  [63 kg] 63 kg (10/10 0835)   Filed Weights   05/14/18 0835  Weight: 63 kg     Physical Exam     General:  Well appearing. No respiratory difficulty HEENT: Normal Neck: Supple. no JVD. Carotids 2+ bilat; no bruits. No lymphadenopathy or thyromegaly appreciated. Cor: PMI nondisplaced. Regular rate & rhythm. No rubs, gallops or murmurs. Lungs: Clear Abdomen: Soft, nontender, nondistended. No hepatosplenomegaly. No bruits or masses. Good bowel sounds. Extremities: No cyanosis, clubbing, rash, edema Neuro: Alert & oriented x 3, cranial nerves grossly  intact. moves all 4 extremities w/o difficulty. Affect  pleasant.   Telemetry   NSR 70s (personally reviewed)   Labs     Basic Metabolic Panel: Recent Labs  Lab 05/08/18 1201  NA 139  K 3.4*  CL 102  CO2 27  GLUCOSE 206*  BUN 34*  CREATININE 0.99  CALCIUM 9.7    Liver Function Tests: Recent Labs  Lab 05/08/18 1201  AST 24  ALT 13  ALKPHOS 59  BILITOT 0.6  PROT 7.8  ALBUMIN 3.6   No results for input(s): LIPASE, AMYLASE in the last 168 hours. No results for input(s): AMMONIA in the last 168 hours.  CBC: Recent Labs  Lab 05/08/18 1201  WBC 9.9  HGB 13.5  HCT 44.8  MCV 86.0  PLT 286    Cardiac Enzymes: No results for input(s): CKTOTAL, CKMB, CKMBINDEX, TROPONINI in the last 168 hours.  BNP: BNP (last 3 results) Recent Labs    05/08/18 1201  BNP 1,105.0*    ProBNP (last 3 results) No results for input(s): PROBNP in the last 8760 hours.   CBG: Recent Labs  Lab 05/14/18 1340  GLUCAP 156*    Coagulation Studies: No results for input(s): LABPROT, INR in the last 72 hours.  Imaging:  No results found.   Assessment/Plan   1. CAD: Coronary angiography showed complex bifurcation lesion with 95% stenosis proximal LAD with calcification at take-off of large D2.  The ostial/proximal D2 also had 95% calcified stenosis. I suspect this lesion plays a role in her exertional dyspnea and also in her mildly decreased LV systolic function (EF 17-00% on echo).  - Discussed with Dr. Ellyn Hack, will plan bifurcation stenting with atherectomy tomorrow morning. Will be complex procedure.  Discussed with patient and family.  Would use femoral access given radial artery vasospasm today.  - ASA 81 and will load Plavix today.  - Increase atorvastatin to 80 mg daily.  2. Pulmonary hypertension: She appeared to have at least moderate pulmonary hypertension by echo with RV failure.  CT chest showed mild emphysema, which should not explain her degree of RV failure.  V/Q  scan did not show evidence for chronic PEs. Anti-centromere antibody, ANA, and RF were all positive.  ?CREST variant of scleroderma.  She was empirically started on sildenafil 20 mg tid by Dr. Johnsie Cancel without much effect so far. She remains quite symptomatic and is on home oxygen.  RHC today showed moderate PAH but very high PVR and low cardiac output.  This is concerning for advanced pulmonary hypertension.  - Could certainly argue for beginning this patient on parenteral therapy.  However, given age/frailty, may be best to start po in this situation.  She is already on sildenafil.  I am going to start Opsumit 10 mg daily then hopefully get her on selexipag very soon.   - I will arrange for her PFTs while in the hospital. - She will need eventual sleep study.  3. Chronic diastolic CHF/RV failure: LV and RV filling pressures are well-compensated on RHC today.  Hold Lasix for now given contrast load, can restart eventually.  4. Diabetes: Hold metformin for procedures, cover with SSI.    Loralie Champagne, MD 05/14/2018, 2:21 PM  Advanced Heart Failure Team Pager [phone] (M-F; [place])  Please contact [facility] for night-coverage after hours (4p -7a ) and weekends on [email or web address]

## 2018-05-14 NOTE — Progress Notes (Signed)
PFT lab notified re: PFT order placed this afternoon.

## 2018-05-15 ENCOUNTER — Encounter (HOSPITAL_COMMUNITY): Payer: Self-pay | Admitting: Cardiology

## 2018-05-15 ENCOUNTER — Encounter (HOSPITAL_COMMUNITY): Payer: Medicare Other

## 2018-05-15 ENCOUNTER — Encounter (HOSPITAL_COMMUNITY)
Admission: RE | Disposition: A | Discharge status: Home / Self Care | Payer: Self-pay | Source: Ambulatory Visit | Attending: Cardiology

## 2018-05-15 DIAGNOSIS — I25119 Atherosclerotic heart disease of native coronary artery with unspecified angina pectoris: Principal | ICD-10-CM

## 2018-05-15 HISTORY — PX: CORONARY STENT INTERVENTION: CATH118234

## 2018-05-15 HISTORY — PX: CORONARY ATHERECTOMY: CATH118238

## 2018-05-15 LAB — CBC WITH DIFFERENTIAL/PLATELET
ABS IMMATURE GRANULOCYTES: 0.01 10*3/uL (ref 0.00–0.07)
Basophils Absolute: 0.1 10*3/uL (ref 0.0–0.1)
Basophils Relative: 1 %
EOS ABS: 0.4 10*3/uL (ref 0.0–0.5)
Eosinophils Relative: 6 %
HCT: 40 % (ref 36.0–46.0)
Hemoglobin: 11.9 g/dL — ABNORMAL LOW (ref 12.0–15.0)
IMMATURE GRANULOCYTES: 0 %
Lymphocytes Relative: 24 %
Lymphs Abs: 1.4 10*3/uL (ref 0.7–4.0)
MCH: 25.4 pg — ABNORMAL LOW (ref 26.0–34.0)
MCHC: 29.8 g/dL — ABNORMAL LOW (ref 30.0–36.0)
MCV: 85.3 fL (ref 80.0–100.0)
MONOS PCT: 9 %
Monocytes Absolute: 0.6 10*3/uL (ref 0.1–1.0)
NEUTROS ABS: 3.7 10*3/uL (ref 1.7–7.7)
NEUTROS PCT: 60 %
PLATELETS: 224 10*3/uL (ref 150–400)
RBC: 4.69 MIL/uL (ref 3.87–5.11)
RDW: 18.2 % — ABNORMAL HIGH (ref 11.5–15.5)
WBC: 6.1 10*3/uL (ref 4.0–10.5)
nRBC: 0 % (ref 0.0–0.2)

## 2018-05-15 LAB — POCT ACTIVATED CLOTTING TIME
ACTIVATED CLOTTING TIME: 180 s
Activated Clotting Time: 301 seconds
Activated Clotting Time: 345 seconds
Activated Clotting Time: 191 seconds
Activated Clotting Time: 252 seconds

## 2018-05-15 LAB — GLUCOSE, CAPILLARY
GLUCOSE-CAPILLARY: 120 mg/dL — AB (ref 70–99)
GLUCOSE-CAPILLARY: 131 mg/dL — AB (ref 70–99)
Glucose-Capillary: 174 mg/dL — ABNORMAL HIGH (ref 70–99)
Glucose-Capillary: 198 mg/dL — ABNORMAL HIGH (ref 70–99)

## 2018-05-15 LAB — BASIC METABOLIC PANEL
ANION GAP: 11 (ref 5–15)
BUN: 20 mg/dL (ref 8–23)
CALCIUM: 8.9 mg/dL (ref 8.9–10.3)
CO2: 21 mmol/L — AB (ref 22–32)
Chloride: 108 mmol/L (ref 98–111)
Creatinine, Ser: 0.8 mg/dL (ref 0.44–1.00)
GFR calc Af Amer: >60 mL/min (ref >60)
GFR calc non Af Amer: >60 mL/min (ref >60)
GLUCOSE: 169 mg/dL — AB (ref 70–99)
Potassium: 4.2 mmol/L (ref 3.5–5.1)
Sodium: 140 mmol/L (ref 135–145)

## 2018-05-15 LAB — MRSA PCR SCREENING: MRSA BY PCR: NEGATIVE

## 2018-05-15 SURGERY — CORONARY ATHERECTOMY
Anesthesia: LOCAL

## 2018-05-15 MED ORDER — IOHEXOL 350 MG/ML SOLN
INTRAVENOUS | Status: DC | PRN
  Administered 2018-05-15: 185 mL via INTRAVENOUS

## 2018-05-15 MED ORDER — NITROGLYCERIN 1 MG/10 ML FOR IR/CATH LAB
INTRA_ARTERIAL | Status: DC | PRN
  Administered 2018-05-15 (×3): 200 ug via INTRACORONARY
  Administered 2018-05-15: 100 ug via INTRACORONARY

## 2018-05-15 MED ORDER — SILDENAFIL CITRATE 20 MG PO TABS
20.0000 mg | ORAL_TABLET | Freq: Three times a day (TID) | ORAL | Status: DC
  Administered 2018-05-15 – 2018-05-16 (×3): 20 mg via ORAL
  Filled 2018-05-15 (×3): qty 1

## 2018-05-15 MED ORDER — SODIUM CHLORIDE 0.9% FLUSH: 3.0000 mL | Freq: Two times a day (BID) | INTRAVENOUS | Status: DC

## 2018-05-15 MED ORDER — FENTANYL CITRATE (PF) 100 MCG/2ML IJ SOLN
INTRAMUSCULAR | Status: AC
  Filled 2018-05-15: qty 2

## 2018-05-15 MED ORDER — NITROGLYCERIN 1 MG/10 ML FOR IR/CATH LAB
INTRA_ARTERIAL | Status: AC
  Filled 2018-05-15: qty 10

## 2018-05-15 MED ORDER — SODIUM CHLORIDE 0.9% FLUSH: 3.0000 mL | INTRAVENOUS | Status: DC | PRN

## 2018-05-15 MED ORDER — MIDAZOLAM HCL 2 MG/2ML IJ SOLN
INTRAMUSCULAR | Status: AC
  Filled 2018-05-15: qty 2

## 2018-05-15 MED ORDER — TRAMADOL-ACETAMINOPHEN 37.5-325 MG PO TABS
2.0000 | ORAL_TABLET | Freq: Four times a day (QID) | ORAL | Status: DC | PRN
  Administered 2018-05-15: 2 via ORAL
  Filled 2018-05-15: qty 2

## 2018-05-15 MED ORDER — LIDOCAINE HCL (PF) 1 % IJ SOLN
INTRAMUSCULAR | Status: DC | PRN
  Administered 2018-05-15: 20 mL via SUBCUTANEOUS

## 2018-05-15 MED ORDER — SODIUM CHLORIDE 0.9 % IV SOLN: 250.0000 mL | INTRAVENOUS | Status: DC | PRN

## 2018-05-15 MED ORDER — HYDRALAZINE HCL 20 MG/ML IJ SOLN
5.0000 mg | INTRAMUSCULAR | Status: AC | PRN
  Administered 2018-05-15: 5 mg via INTRAVENOUS
  Filled 2018-05-15: qty 1

## 2018-05-15 MED ORDER — FENTANYL CITRATE (PF) 100 MCG/2ML IJ SOLN
INTRAMUSCULAR | Status: DC | PRN
  Administered 2018-05-15 (×4): 25 ug via INTRAVENOUS

## 2018-05-15 MED ORDER — MORPHINE SULFATE (PF) 2 MG/ML IV SOLN
2.0000 mg | INTRAVENOUS | Status: DC | PRN
  Administered 2018-05-15 (×2): 2 mg via INTRAVENOUS
  Filled 2018-05-15 (×2): qty 1

## 2018-05-15 MED ORDER — VIPERSLIDE LUBRICANT OPTIME
TOPICAL | Status: DC | PRN
  Administered 2018-05-15: 10:00:00 via SURGICAL_CAVITY

## 2018-05-15 MED ORDER — LIDOCAINE HCL (PF) 1 % IJ SOLN
INTRAMUSCULAR | Status: AC
  Filled 2018-05-15: qty 30

## 2018-05-15 MED ORDER — ANGIOPLASTY BOOK
Freq: Once | Status: AC
  Administered 2018-05-16: 06:00:00 1
  Filled 2018-05-15: qty 1

## 2018-05-15 MED ORDER — HEPARIN SODIUM (PORCINE) 5000 UNIT/ML IJ SOLN
5000.0000 [IU] | Freq: Three times a day (TID) | INTRAMUSCULAR | Status: DC
  Administered 2018-05-15 – 2018-05-16 (×2): 5000 [IU] via SUBCUTANEOUS
  Filled 2018-05-15 (×2): qty 1

## 2018-05-15 MED ORDER — MIDAZOLAM HCL 2 MG/2ML IJ SOLN
INTRAMUSCULAR | Status: DC | PRN
  Administered 2018-05-15 (×2): 1 mg via INTRAVENOUS

## 2018-05-15 MED ORDER — HEPARIN (PORCINE) IN NACL 1000-0.9 UT/500ML-% IV SOLN
INTRAVENOUS | Status: DC | PRN
  Administered 2018-05-15 (×3): 500 mL

## 2018-05-15 MED ORDER — SODIUM CHLORIDE 0.9 % IV SOLN
INTRAVENOUS | Status: AC
  Administered 2018-05-15: 13:00:00 via INTRAVENOUS

## 2018-05-15 MED ORDER — HEPARIN SODIUM (PORCINE) 1000 UNIT/ML IJ SOLN
INTRAMUSCULAR | Status: DC | PRN
  Administered 2018-05-15: 4000 [IU] via INTRAVENOUS
  Administered 2018-05-15: 5500 [IU] via INTRAVENOUS

## 2018-05-15 MED ORDER — LABETALOL HCL 5 MG/ML IV SOLN
10.0000 mg | INTRAVENOUS | Status: AC | PRN
  Administered 2018-05-15: 14:00:00 10 mg via INTRAVENOUS
  Filled 2018-05-15: qty 4

## 2018-05-15 MED ORDER — HEPARIN (PORCINE) IN NACL 1000-0.9 UT/500ML-% IV SOLN
INTRAVENOUS | Status: AC
  Filled 2018-05-15: qty 1000

## 2018-05-15 MED FILL — Verapamil HCl IV Soln 2.5 MG/ML: INTRAVENOUS | Qty: 2 | Status: AC

## 2018-05-15 SURGICAL SUPPLY — 32 items
BALLN SAPPHIRE 2.5X12 (BALLOONS) ×2
BALLN SAPPHIRE 2.5X15 (BALLOONS) ×2
BALLN SAPPHIRE ~~LOC~~ 3.25X12 (BALLOONS) ×2 IMPLANT
BALLN WOLVERINE 2.50X10 (BALLOONS) ×2
BALLN ~~LOC~~ EMERGE MR 3.0X6 (BALLOONS) ×2
BALLN ~~LOC~~ EUPHORA RX 3.0X6 (BALLOONS) ×2
BALLOON SAPPHIRE 2.5X12 (BALLOONS) ×1 IMPLANT
BALLOON SAPPHIRE 2.5X15 (BALLOONS) ×1 IMPLANT
BALLOON WOLVERINE 2.50X10 (BALLOONS) ×1 IMPLANT
BALLOON ~~LOC~~ EMERGE MR 3.0X6 (BALLOONS) ×1 IMPLANT
BALLOON ~~LOC~~ EUPHORA RX 3.0X6 (BALLOONS) ×1 IMPLANT
CATH VISTA GUIDE 6FR XBLAD3.5 (CATHETERS) IMPLANT
CATH VISTA GUIDE 7FRXB LAD 3.5 (CATHETERS) ×2 IMPLANT
CROWN DIAMONDBACK CLASSIC 1.25 (BURR) ×2 IMPLANT
ELECT DEFIB PAD ADLT CADENCE (PAD) ×2 IMPLANT
KIT ENCORE 26 ADVANTAGE (KITS) ×4 IMPLANT
KIT HEART LEFT (KITS) ×2 IMPLANT
LUBRICANT VIPERSLIDE CORONARY (MISCELLANEOUS) ×2 IMPLANT
PACK CARDIAC CATHETERIZATION (CUSTOM PROCEDURE TRAY) ×2 IMPLANT
SHEATH AVANTI 11CM 8FR (SHEATH) ×2 IMPLANT
SHEATH BRITE TIP 7FR 35CM (SHEATH) ×2 IMPLANT
SHEATH PINNACLE 7F 10CM (SHEATH) ×2 IMPLANT
SHEATH PROBE COVER 6X72 (BAG) ×2 IMPLANT
STENT SYNERGY DES 2.75X8 (Permanent Stent) ×2 IMPLANT
STENT SYNERGY DES 3X20 (Permanent Stent) ×2 IMPLANT
TRANSDUCER W/STOPCOCK (MISCELLANEOUS) ×2 IMPLANT
TUBING CIL FLEX 10 FLL-RA (TUBING) ×2 IMPLANT
WIRE ASAHI PROWATER 180CM (WIRE) ×2 IMPLANT
WIRE EMERALD 3MM-J .035X150CM (WIRE) ×2 IMPLANT
WIRE FIGHTER CROSSING 190CM (WIRE) ×2 IMPLANT
WIRE RUNTHROUGH .014X180CM (WIRE) ×2 IMPLANT
WIRE VIPER ADVANCE COR .012TIP (WIRE) ×2 IMPLANT

## 2018-05-15 NOTE — Progress Notes (Addendum)
Advanced Heart Failure Rounding Note  PCP-Cardiologist: No primary care provider on file.   Subjective:    Lasix on hold. Creatinine stable. Weight up 1 lb. On nitro drip currently @ 10 for BP control. SBP 120-160s. Started on opsumit yesterday.  Going for bifurcation stenting with atherectomy today.  Denies CP, SOB, orthopnea, or dizziness.   RHC/LHC (05/14/18) Coronary Findings  Diagnostic  Dominance: Right  Left Main  20% distal left main narrowing.  Left Anterior Descending  Complicated, calcified lesion at the bifurcation of the proximal LAD and a large 1st diagonal. There is 95% stenosis in the LAD at the bifurcation and 95% stenosis in the large D2 at the bifurcation.  Left Circumflex  Luminal irregularities.  Right Coronary Artery  30% mid RCA, 30% distal RCA.  Intervention   No interventions have been documented.  Right Heart   Right Heart Pressures RHC Procedural Findings: Hemodynamics (mmHg) RA mean 7 RV 64/11 PA 64/21, mean 38 PCWP mean 6 LV 140/5 AO 144/72  Oxygen saturations: PA 50% AO 93%  Cardiac Output (Fick) 2.76  Cardiac Index (Fick) 1.7 PVR 11.6 WU  Cardiac Output (Thermo) 3.07 Cardiac Index (Thermo) 1.9  PVR 10.4 WU   Objective:   Weight Range: 63.6 kg Body mass index is 25.65 kg/m.   Vital Signs:   Temp:  [97.5 F (36.4 C)-98.1 F (36.7 C)] 97.5 F (36.4 C) (10/11 0446) Pulse Rate:  [62-89] 80 (10/11 0446) Resp:  [6-26] 21 (10/11 0446) BP: (125-202)/(59-150) 169/97 (10/11 0446) SpO2:  [0 %-99 %] 94 % (10/11 0446) Weight:  [63 kg-63.6 kg] 63.6 kg (10/11 0446) Last BM Date: 05/13/18  Weight change: Filed Weights   05/14/18 0835 05/15/18 0446  Weight: 63 kg 63.6 kg    Intake/Output:   Intake/Output Summary (Last 24 hours) at 05/15/2018 0749 Last data filed at 05/14/2018 2200 Gross per 24 hour  Intake 539.41 ml  Output -  Net 539.41 ml      Physical Exam    General:  Well appearing. No resp  difficulty HEENT: Normal Neck: Supple. No JVD. Carotids 2+ bilat; no bruits. No lymphadenopathy or thyromegaly appreciated. Cor: PMI nondisplaced. Regular rate & rhythm. No rubs, gallops or murmurs. Lungs: diminished throughout. Abdomen: Soft, nontender, nondistended. No hepatosplenomegaly. No bruits or masses. Good bowel sounds. Extremities: No cyanosis, clubbing, rash, edema. Right groin soft, no bruising. Neuro: Alert & orientedx3, cranial nerves grossly intact. moves all 4 extremities w/o difficulty. Affect pleasant   Telemetry   NSR 70-80s with rare PVCs. Personally reviewed.   EKG    No new tracings.   Labs    CBC Recent Labs    05/14/18 1558 05/15/18 0425  WBC 5.2 6.1  NEUTROABS  --  3.7  HGB 11.0* 11.9*  HCT 36.7 40.0  MCV 85.9 85.3  PLT 210 425   Basic Metabolic Panel Recent Labs    05/14/18 1558 05/15/18 0425  NA 139 140  K 2.6* 4.2  CL 109 108  CO2 23 21*  GLUCOSE 164* 169*  BUN 19 20  CREATININE 0.63 0.80  CALCIUM 7.4* 8.9   Liver Function Tests No results for input(s): AST, ALT, ALKPHOS, BILITOT, PROT, ALBUMIN in the last 72 hours. No results for input(s): LIPASE, AMYLASE in the last 72 hours. Cardiac Enzymes No results for input(s): CKTOTAL, CKMB, CKMBINDEX, TROPONINI in the last 72 hours.  BNP: BNP (last 3 results) Recent Labs    05/08/18 1201  BNP 1,105.0*    ProBNP (  last 3 results) No results for input(s): PROBNP in the last 8760 hours.   D-Dimer No results for input(s): DDIMER in the last 72 hours. Hemoglobin A1C No results for input(s): HGBA1C in the last 72 hours. Fasting Lipid Panel No results for input(s): CHOL, HDL, LDLCALC, TRIG, CHOLHDL, LDLDIRECT in the last 72 hours. Thyroid Function Tests No results for input(s): TSH, T4TOTAL, T3FREE, THYROIDAB in the last 72 hours.  Invalid input(s): FREET3  Other results:   Imaging    Dg Chest 2 View  Result Date: 05/14/2018 CLINICAL DATA:  Pulmonary hypertension. Heart  catheterization today and will be getting stents tomorrow. History of diabetes. Former smoker. EXAM: CHEST - 2 VIEW COMPARISON:  03/10/2018 FINDINGS: Shallow inspiration. Cardiac enlargement with mild central vascular congestion. Bronchial thickening and central interstitial pattern suggesting chronic bronchitis. No airspace disease or consolidation in the lungs. No blunting of costophrenic angles. No pneumothorax. Mediastinal contours appear intact. Calcification and torsion of the aorta. Esophageal hiatal hernia behind the heart. Degenerative changes in the spine. IMPRESSION: Cardiac enlargement with mild central vascular congestion. Chronic bronchitic changes. No edema or consolidation. Electronically Signed   By: Lucienne Capers M.D.   On: 05/14/2018 21:06      Medications:     Scheduled Medications: . aspirin  81 mg Oral Daily  . atorvastatin  80 mg Oral q1800  . carvedilol  12.5 mg Oral BID WC  . clopidogrel  75 mg Oral Daily  . gabapentin  300 mg Oral TID  . heparin  5,000 Units Subcutaneous Q8H  . insulin aspart  0-15 Units Subcutaneous TID WC  . insulin aspart  0-5 Units Subcutaneous QHS  . macitentan  10 mg Oral Daily  . mirtazapine  30 mg Oral QHS  . pantoprazole  40 mg Oral BID  . sodium chloride flush  3 mL Intravenous Q12H  . sodium chloride flush  3 mL Intravenous Q12H     Infusions: . sodium chloride    . sodium chloride    . sodium chloride 1 mL/kg/hr (05/15/18 0500)  . nitroGLYCERIN 10 mcg/min (05/14/18 2200)     PRN Medications:  sodium chloride, sodium chloride, acetaminophen, ondansetron (ZOFRAN) IV, sodium chloride flush, sodium chloride flush    Patient Profile   [name] is a 84 y.o. female with PMH of DM, HTN, osteomyelitis, pulmonary HTN, RV failure, and PNA in 10/2017.  [name] was admitted after [facility] 10/10.  Assessment/Plan   1. CAD: Coronary angiography showed complex bifurcation lesion with 95% stenosis proximal LAD with calcification at  take-off of large D2.  The ostial/proximal D2 also had 95% calcified stenosis. I suspect this lesion plays a role in her exertional dyspnea and also in her mildly decreased LV systolic function (EF 43-32% on echo).  - Dr Aundra Dubin discussed with Dr. Ellyn Hack, will plan bifurcation stenting with atherectomy tomorrow morning. Will be complex procedure.  Discussed with patient and family.  Would use femoral access given radial artery vasospasm today.  - Continue ASA 81 and plavix (loaded yesterday) - Continue atorvastatin 80 mg daily.  - No CP. 2. Pulmonary hypertension: [name] appeared to have at least moderate pulmonary hypertension by echo with RV failure. CT chest showed mild emphysema, which should not explain her degree of RV failure. V/Q scan did not show evidence for chronic PEs. Anti-centromere antibody, ANA, and RF were all positive.  ?CREST variant of scleroderma. [name] was empirically started on sildenafil 20 mg tid by Dr. Johnsie Cancel without much effect so far. [name]  remains quite symptomatic and is on home oxygen.  RHC today showed moderate PAH but very high PVR and low cardiac output.  This is concerning for advanced pulmonary hypertension.  - Could certainly argue for beginning this patient on parenteral therapy.  However, given age/frailty, may be best to start po in this situation.  [name] is already on sildenafil 20 mg TID at home. Currently on nitro drip @ 10 with sildenafil on hold. SBP 120-160s. - Continue Opsumit 10 mg daily. Hopefully will get her on selexipag very soon.   - PFTs ordered. - [name] will need eventual sleep study.  3. Chronic diastolic CHF/RV failure: LV and RV filling pressures are well-compensated on RHC today.  - Lasix on hold. Creatinine stable today 0.80. Hold off on restarting with cath today. 4. Diabetes: Hold metformin for procedures, cover with SSI. No change.   Length of Stay: [place], NP  05/15/2018, 7:49 AM  Advanced Heart Failure Team Pager [phone] (M-F;  7a - 4p)  Please contact [facility] for night-coverage after hours (4p -7a ) and weekends on [email or web address]  Patient seen with NP, agree with the above note.   [name] had successful atherectomy with bifurcation stenting of proximal LAD and D2 today.  [name] is on ASA 81, statin, and Plavix.   BP stable on NTG gtt.  Will titrate off NTG gtt and restart her home sildenafil 20 mg tid.  [name] will need to start on Opsumit 10 mg daily for home for pulmonary hypertension, we will have to work on getting this for her as an outpatient.   With elevated BP this admission, will add lisinopril 5 mg daily given CAD and stable creatinine.   [name] can probably go home in the morning with close followup in CHF clinic.  Will need to work on getting her Opsumit.   Loralie Champagne 05/15/2018 4:33 PM

## 2018-05-15 NOTE — Brief Op Note (Signed)
BRIEF ATHERECTOMY-PCI NOTE  05/15/2018  11:42 AM  PATIENT:  [name]  84 y.o. female with diabetes and hypertension noted to have pulmonary hypertension with right ventricular failure followed by Dr. Johnsie Cancel.  He was referred to Dr. Aundra Dubin for pulmonary pretension management.  She underwent right and left heart catheterization on May 14, 2018 revealing severe LAD-D2 stenosis of roughly 95% [place] 1-1-0 calcified lesion.  She returns today for staged atherectomy based bifurcation PCI.  PRE-OPERATIVE DIAGNOSIS: CAD with angina: LAD-D2 [place] 1-1-0 calcified lesion 95%  POST-OPERATIVE DIAGNOSIS: Successful bifurcation orbital atherectomy based PCI of LAD-D2 -with DK technique.  Synergy DES 2.75 mm x 8 mm ostial D2 (3.1 mm)  Synergy DES 3.0 mm x 20 mm mid LAD crossing D2 (3.4 mm)  PROCEDURE:  Procedure(s): CORONARY ATHERECTOMY (N/A) CORONARY STENT INTERVENTION (N/A)   Direct ultrasound guidance used for 7 French long sheath placed in RFA.  Permanent image obtained placed on chart.  Modified Seldinger technique  7 [place] XB LAD 3.5 guide --> viper wire down the LAD --> DIAMONDBACK ATHERECTOMY (5x 25-second low-speed passes, 4 x 25-sec high-speed passes)  2.5 mm x 15 mm PTCA LAD into D2  2.5 mm x 10 mm [place]ting Balloon LAD into D2  Kissing balloons: 2.5 mm x 15 mm in LAD, 2.5 mm x 12 mm D2  Stent D2: Synergy DES 2.75 mm x 8 mm --> postdilated with 3.0 x 6 [place] balloon (LAD stent was then placed at this time)  Stent LAD: Synergy DES 3.0 mm x 20 mm --> postdilated using 3.25 mm x 12 mm [place] balloon using kissing technique with a 3.0 mm x 6 pulmonary [place] balloon in D2  Following PCI angiography, the guide catheter removed, and the 7 [place] long sheath was exchanged for 8 [place] short sheath due to track ooze.  SURGEON:  Surgeon(s) and Role:    * Leonie Man, MD - Primary  PHYSICIAN ASSISTANT:   ASSISTANTS: Sheral Flow -CSI represented  ANESTHESIA:   local  and IV sedation; 20 mL lidocaine subcu.  2 mg Versed, 100 mcg fentanyl (vital signs monitored throughout the entire procedure)  EBL:  86m   SHEATH: 6 8 French short sheath sutured in place.  MEDICATIONS USED: Contrast 185 mL.  IC nitroglycerin 200 mcg x 5.  Total heparin 9500 units  DICTATION: .Note written in EM[place]an Anticipate discharge tomorrow if stable, but may need further titration of medications.  PATIENT DISPOSITION:  PACU - hemodynamically stable. Will return to the post procedure unit.   Delay start of Pharmacological VTE agent (>24hrs) due to surgical blood loss or risk of bleeding: not applicable; delay 8 hours after sheath removal   DGlenetta Hew M.D., M.S. Interventional Cardiologist   Pager # [phone] # [phone] [place] [address]

## 2018-05-15 NOTE — Care Management Note (Signed)
Case Management Note  Patient Details  Name: [name] MRN: [id number] Date of Birth: Jan 19, 1932  Subjective/Objective:  From home, s/p coronary stent intervention, will be on plavix.  NCM gave patient a list of pulse oximeters from [employer] that she can purchase from [place] at a reasonable price, she states her granddaughter can do that for her.  She was appreciative of the information.                   Action/Plan: DC home when ready.  Expected Discharge Date:                  Expected Discharge Plan:  Home/Self Care  In-House Referral:     Discharge planning Services  CM Consult  Post Acute Care Choice:    Choice offered to:     DME Arranged:    DME Agency:     HH Arranged:    [place] Agency:     Status of Service:  Completed, signed off  If discussed at [employer] of Stay Meetings, dates discussed:    Additional Comments:  Zenon Mayo, RN 05/15/2018, 4:02 PM

## 2018-05-15 NOTE — Progress Notes (Signed)
Order for sheath removal verified per post procedural orders. Procedure explained to patient and RIGHT FEMORAL artery access site assessed: level 0, palpable dorsalis pedis. 8 [place] Sheath removed and manual pressure applied for 50 minutes. Pre, peri, & post procedural vitals: HR 80, RR 20, O2 Sat 95%, BP 11/53, Pain level 0. Distal pulses remained intact after sheath removal. Access site WITH NOTED HEMATOMA. THE AREA WAS MARKED AND ASSESSMENT MADE BY ATTENDING RN. THE SITE WAS dressed with 4X4 gauze and tegaderm.  Jamey Reas, RN confirmed condition of site. Post procedural instructions discussed with return demonstration from patient.

## 2018-05-16 ENCOUNTER — Other Ambulatory Visit: Payer: Self-pay | Admitting: Physician Assistant

## 2018-05-16 DIAGNOSIS — S301XXA Contusion of abdominal wall, initial encounter: Secondary | ICD-10-CM

## 2018-05-16 DIAGNOSIS — I272 Pulmonary hypertension, unspecified: Secondary | ICD-10-CM

## 2018-05-16 DIAGNOSIS — Z955 Presence of coronary angioplasty implant and graft: Secondary | ICD-10-CM

## 2018-05-16 DIAGNOSIS — D62 Acute posthemorrhagic anemia: Secondary | ICD-10-CM

## 2018-05-16 LAB — CBC WITH DIFFERENTIAL/PLATELET
Abs Immature Granulocytes: 0.02 10*3/uL (ref 0.00–0.07)
BASOS ABS: 0 10*3/uL (ref 0.0–0.1)
Basophils Relative: 1 %
EOS ABS: 0.2 10*3/uL (ref 0.0–0.5)
EOS PCT: 4 %
HEMATOCRIT: 34.5 % — AB (ref 36.0–46.0)
Hemoglobin: 9.9 g/dL — ABNORMAL LOW (ref 12.0–15.0)
Immature Granulocytes: 0 %
Lymphocytes Relative: 25 %
Lymphs Abs: 1.7 10*3/uL (ref 0.7–4.0)
MCH: 24.9 pg — ABNORMAL LOW (ref 26.0–34.0)
MCHC: 28.7 g/dL — AB (ref 30.0–36.0)
MCV: 86.7 fL (ref 80.0–100.0)
Monocytes Absolute: 0.7 10*3/uL (ref 0.1–1.0)
Monocytes Relative: 10 %
NEUTROS ABS: 4 10*3/uL (ref 1.7–7.7)
NEUTROS PCT: 60 %
Platelets: 208 10*3/uL (ref 150–400)
RBC: 3.98 MIL/uL (ref 3.87–5.11)
RDW: 18.6 % — AB (ref 11.5–15.5)
WBC: 6.6 10*3/uL (ref 4.0–10.5)
nRBC: 0 % (ref 0.0–0.2)

## 2018-05-16 LAB — BASIC METABOLIC PANEL
ANION GAP: 7 (ref 5–15)
BUN: 15 mg/dL (ref 8–23)
CALCIUM: 8.5 mg/dL — AB (ref 8.9–10.3)
CO2: 21 mmol/L — ABNORMAL LOW (ref 22–32)
Chloride: 112 mmol/L — ABNORMAL HIGH (ref 98–111)
Creatinine, Ser: 0.99 mg/dL (ref 0.44–1.00)
GFR, EST AFRICAN AMERICAN: 59 mL/min — AB (ref >60)
GFR, EST NON AFRICAN AMERICAN: 51 mL/min — AB (ref >60)
GLUCOSE: 160 mg/dL — AB (ref 70–99)
POTASSIUM: 3.9 mmol/L (ref 3.5–5.1)
Sodium: 140 mmol/L (ref 135–145)

## 2018-05-16 LAB — GLUCOSE, CAPILLARY: Glucose-Capillary: 151 mg/dL — ABNORMAL HIGH (ref 70–99)

## 2018-05-16 MED ORDER — MACITENTAN 10 MG PO TABS: 10.0000 mg | ORAL_TABLET | Freq: Every day | ORAL | 1 refills | Status: DC

## 2018-05-16 MED ORDER — ATORVASTATIN CALCIUM 80 MG PO TABS: 80.0000 mg | ORAL_TABLET | Freq: Every day | ORAL | 6 refills | Status: DC

## 2018-05-16 MED ORDER — NITROGLYCERIN 0.4 MG SL SUBL: 0.4000 mg | SUBLINGUAL_TABLET | SUBLINGUAL | 12 refills | Status: DC | PRN

## 2018-05-16 MED ORDER — ASPIRIN EC 81 MG PO TBEC: 81.0000 mg | DELAYED_RELEASE_TABLET | Freq: Every day | ORAL | 11 refills | Status: AC

## 2018-05-16 MED ORDER — CLOPIDOGREL BISULFATE 75 MG PO TABS: 75.0000 mg | ORAL_TABLET | Freq: Every day | ORAL | 6 refills | Status: DC

## 2018-05-16 NOTE — Discharge Summary (Addendum)
Discharge Summary    Patient ID: [name] MRN: [id number]; DOB: 1932/05/27  Admit date: 05/14/2018 Discharge date: 05/16/2018  Primary Care Provider: [name], PA-C  Primary Cardiologist:Dr. Johnsie Cancel  CHF: Dr. Aundra Dubin   Discharge Diagnoses    Active Problems:   Exertional dyspnea   Coronary artery disease involving native coronary artery of native heart with angina pectoris [facility])   Pulmonary hypertension, unspecified ([place])   Status post coronary artery stent placement    prior osteomyelitis of jaw    HTN   DM   RV failure    Anemia   Allergies Allergies  Allergen Reactions  . Sulfa Antibiotics Swelling    Mouth and tongue swelling    Diagnostic Studies/Procedures    CORONARY STENT INTERVENTION  CORONARY ATHERECTOMY  Conclusion     Prox LAD to Mid LAD lesion is 95% stenosed with 95% stenosed side branch in Ost 2nd Diag.  A drug-eluting stent was successfully placed in the LAD using a STENT SYNERGY DES 3X20. Post intervention, there is a 0% residual stenosis.  A drug-eluting stent was successfully placed in the ostial 2nd Diag using a STENT SYNERGY DES 2.75X8. Post intervention, the side branch was reduced to 0% residual stenosis.  Mid RCA lesion is 30% stenosed.   Successful bifurcation orbital atherectomy based PCI of LAD-D2 -with DK (double kiss) technique.  Synergy DES 2.75 mm x 8 mm ostial D2 (3.1 mm)  Synergy DES 3.0 mm x 20 mm mid LAD crossing D2 (3.4 mm)  Plan: Return to nursing unit for ongoing care and sheath removal per protocol. Continue pulmonary pretension management per Dr. Aundra Dubin.   Recommend uninterrupted dual antiplatelet therapy with Aspirin 58m daily and Clopidogrel 714mdaily for a minimum of 12 months (ACS - Class I recommendation).  Okay to stop aspirin after 6 months.   Diagnostic Diagram       Post-Intervention Diagram           History of Present Illness     [age]o with history of DM, HTN, and prior  osteomyelitis of jaw, CAD and pulmonary hypertension/RV failure presented for scheduled atherectomy.   Recently dealing with DOE. She had an echo in 7/19 that showed mildly decreased LV systolic function but severely decreased RV systolic function with dilated RV and at least moderate pulmonary hypertension by echo estimation.She saw her PCP in 8/19 and was noted to be hypoxemic at rest. He started her on home oxygen 2 L/[place] which she continues. She felt a little better on oxygen and is now able to walk farther. She still gets markedly short of breath walking more than about 50 yards. Seen by Dr. NiJohnsie Cancel> Amlodipine was stopped and she was started empirically on sildenafil 20 mg tid for pulmonary hypertension. Seen by Dr. McAundra Dubinor pHTN and underwent R&L cath on 10/10 as below and now admitted for for atherectomy.   RHC/LHC (05/14/18) Coronary Findings   Diagnostic  Dominance: Right  Left Main  20% distal left main narrowing.  Left Anterior Descending  Complicated, calcified lesion at the bifurcation of the proximal LAD and a large 1st diagonal. There is 95% stenosis in the LAD at the bifurcation and 95% stenosis in the large D2 at the bifurcation.  Left Circumflex  Luminal irregularities.  Right Coronary Artery  30% mid RCA, 30% distal RCA.  Intervention   No interventions have been documented.  Right Heart   Right Heart Pressures RHC Procedural Findings: Hemodynamics (mmHg) RA mean 7 RV 64/11  PA 64/21, mean 38 PCWP mean 6 LV 140/5 AO 144/72  Oxygen saturations: PA 50% AO 93%  Cardiac Output (Fick) 2.76  Cardiac Index (Fick) 1.7 PVR 11.6 WU  Cardiac Output (Thermo) 3.07 Cardiac Index (Thermo) 1.9  PVR 10.4 WU    Hospital Course     Consultants: None  She underwent successful bifurcation orbital atherectomy based PCI of LAD-D2 -with DK (double kiss) technique. Synergy DES 2.75 mm x 8 mm ostial D2 (3.1 mm) and Synergy DES 3.0 mm x 20 mm mid LAD crossing D2  (3.4 mm). Post procedure patient had r groin hematoma which improved with pressure.  Hemoglobin dropped as 11>>11.9>>9.9. She has baseline anemia in past. No overt hematoma and felt stable for discharge after evaluation by Dr. Debara Pickett. Started on DAPT ASA and plavix. Otherwise continue other medical therapy. Started on Opsumit 10 mg daily. Plan for outpatient LFTS and sleep study per CHF team. She will have CBC check early next week followed by appointment in CHF clinic.   She has been seen by Dr. Debara Pickett today and deemed ready for discharge home. All follow-up appointments have been scheduled. Discharge medications are listed below.   Discharge Vitals Blood pressure (!) 110/56, pulse 76, temperature (!) 97.5 F (36.4 C), resp. rate 18, height _0  (1.575 m), weight 64.5 kg, SpO2 93 %.  Filed Weights   05/14/18 0835 05/15/18 0446 05/16/18 0500  Weight: 63 kg 63.6 kg 64.5 kg  Physical Exam  Constitutional: She is oriented to person, place, and time and well-developed, well-nourished, and in no distress.  HENT:  Head: Normocephalic and atraumatic.  Eyes: Pupils are equal, round, and reactive to light. EOM are normal.  Neck: Normal range of motion. Neck supple.  Cardiovascular: Normal rate and regular rhythm.  Soft right groin hematoma. Trace edema bilaterally   Pulmonary/Chest: Effort normal and breath sounds normal.  Abdominal: Soft. Bowel sounds are normal.  Musculoskeletal: Normal range of motion.  Neurological: She is alert and oriented to person, place, and time.  Skin: Skin is warm and dry.  Psychiatric: Affect normal.    Labs & Radiologic Studies    CBC Recent Labs    05/15/18 0425 05/16/18 0245  WBC 6.1 6.6  NEUTROABS 3.7 4.0  HGB 11.9* 9.9*  HCT 40.0 34.5*  MCV 85.3 86.7  PLT 224 016   Basic Metabolic Panel Recent Labs    05/15/18 0425 05/16/18 0245  NA 140 140  K 4.2 3.9  CL 108 112*  CO2 21* 21*  GLUCOSE 169* 160*  BUN 20 15  CREATININE 0.80 0.99  CALCIUM 8.9  8.5*  _____________  Dg Chest 2 View  Result Date: 05/14/2018 CLINICAL DATA:  Pulmonary hypertension. Heart catheterization today and will be getting stents tomorrow. History of diabetes. Former smoker. EXAM: CHEST - 2 VIEW COMPARISON:  03/10/2018 FINDINGS: Shallow inspiration. Cardiac enlargement with mild central vascular congestion. Bronchial thickening and central interstitial pattern suggesting chronic bronchitis. No airspace disease or consolidation in the lungs. No blunting of costophrenic angles. No pneumothorax. Mediastinal contours appear intact. Calcification and torsion of the aorta. Esophageal hiatal hernia behind the heart. Degenerative changes in the spine. IMPRESSION: Cardiac enlargement with mild central vascular congestion. Chronic bronchitic changes. No edema or consolidation. Electronically Signed   By: Lucienne Capers M.D.   On: 05/14/2018 21:06   Disposition   Pt is being discharged home today in good condition.  Follow-up Plans & Appointments    Follow-up Information    Josue Hector,  MD. Daphane Shepherd on 06/17/2018.   Specialty:  Cardiology Why:  _0 :15 for 3 month follow up  Contact information: [address] [address] [phone]        [facility] [employer]. Go on 05/20/2018.   Specialty:  Cardiology Why:  CBC check between 7:30 to 5pm non fasting  Contact information: [address] [place] [phone]         Discharge Instructions    Amb Referral to Cardiac Rehabilitation   Complete by:  As directed    Referring to [place] Phase 2   Diagnosis:  Coronary Stents   Diet - low sodium heart healthy   Complete by:  As directed    Discharge instructions   Complete by:  As directed    No driving for 48 hours. No lifting over 5 lbs for 1 week. No sexual activity for 1 week.  Keep procedure site clean & dry. If you notice increased pain, swelling, bleeding or pus, call/return!   You may shower, but no soaking baths/hot tubs/pools for 1 week.   Increase activity slowly   Complete by:  As directed       Discharge Medications   Allergies as of 05/16/2018      Reactions   Sulfa Antibiotics Swelling   Mouth and tongue swelling      Medication List    TAKE these medications   ALPRAZolam 0.25 MG tablet Commonly known as:  XANAX Take 0.25 mg by mouth daily as needed for anxiety.   aspirin EC 81 MG tablet Take 1 tablet (81 mg total) by mouth daily.   atorvastatin 80 MG tablet Commonly known as:  LIPITOR Take 1 tablet (80 mg total) by mouth daily. What changed:    medication strength  how much to take   baclofen 10 MG tablet Commonly known as:  LIORESAL Take 10 mg by mouth 2 (two) times daily.   carvedilol 12.5 MG tablet Commonly known as:  COREG Take 1 tablet (12.5 mg total) by mouth 2 (two) times daily with a meal.   chlorhexidine 0.12 % solution Commonly known as:  PERIDEX 15 mLs by Mouth Rinse route 2 (two) times daily as needed (sore gums).   clopidogrel 75 MG tablet Commonly known as:  PLAVIX Take 1 tablet (75 mg total) by mouth daily.   cyclobenzaprine 10 MG tablet Commonly known as:  FLEXERIL Take 1 tablet (10 mg total) by mouth 3 (three) times daily as needed for muscle spasms. What changed:  when to take this   gabapentin 300 MG capsule Commonly known as:  NEURONTIN Take 300 mg by mouth at bedtime.   hydrochlorothiazide 25 MG tablet Commonly known as:  HYDRODIURIL Take 1 tablet (25 mg total) by mouth daily.   lactose free nutrition Liqd Take 237 mLs by mouth daily at 2 PM.   macitentan 10 MG tablet Commonly known as:  OPSUMIT Take 1 tablet (10 mg total) by mouth daily. Will need appointment with Dr. Aundra Dubin for further refills   mirtazapine 30 MG tablet Commonly known as:  REMERON Take 30 mg by mouth at bedtime.   pantoprazole 40 MG tablet Commonly known as:  PROTONIX Take 1 tablet (40 mg total) by mouth 2 (two) times  daily. What changed:  when to take this   potassium chloride SA 20 MEQ tablet Commonly known as:  K-DUR,KLOR-CON Take 1 tablet (20 mEq total) by mouth daily.   sildenafil 20  MG tablet Commonly known as:  REVATIO Take 1 tablet (20 mg total) by mouth 3 (three) times daily.   traMADol-acetaminophen 37.5-325 MG tablet Commonly known as:  ULTRACET Take 2 tablets by mouth 4 (four) times daily as needed for pain.       Acute coronary syndrome (MI, NSTEMI, STEMI, etc) this admission?: No.    Outstanding Labs/Studies   CBC in 5 days PFTs and sleep study per CHF team LFT and lipid panel in 6 weeks   Duration of Discharge Encounter   Greater than 30 minutes including physician time.  Jarrett Soho, PA 05/16/2018, 9:33 AM

## 2018-05-16 NOTE — Progress Notes (Signed)
[id number] Instructed not to walk with pt until seen by cardiology per RN. Staff will walk with pt later. Reviewed importance of plavix with stent. Reviewed NTG use, gave heart healthy diet (pt stated not diabetic), modified ex ed. Pt stated she has cane at home that she uses. Discussed CRP 2 and referred to [facility].  Pt voiced understanding of ed. Graylon Good RN BSN 05/16/2018 8:14 AM

## 2018-05-18 ENCOUNTER — Observation Stay (HOSPITAL_COMMUNITY)
Admission: EM | Admit: 2018-05-18 | Discharge: 2018-05-21 | Disposition: A | Discharge status: Home / Self Care | Payer: Medicare Other | Attending: Cardiology | Admitting: Cardiology

## 2018-05-18 ENCOUNTER — Encounter (HOSPITAL_COMMUNITY): Payer: Self-pay | Admitting: *Deleted

## 2018-05-18 ENCOUNTER — Emergency Department (HOSPITAL_COMMUNITY): Payer: Medicare Other

## 2018-05-18 ENCOUNTER — Other Ambulatory Visit: Payer: Self-pay

## 2018-05-18 ENCOUNTER — Telehealth: Payer: Self-pay | Admitting: Cardiology

## 2018-05-18 DIAGNOSIS — Y838 Other surgical procedures as the cause of abnormal reaction of the patient, or of later complication, without mention of misadventure at the time of the procedure: Secondary | ICD-10-CM | POA: Diagnosis not present

## 2018-05-18 DIAGNOSIS — I700 Atherosclerosis of aorta: Secondary | ICD-10-CM | POA: Insufficient documentation

## 2018-05-18 DIAGNOSIS — E1122 Type 2 diabetes mellitus with diabetic chronic kidney disease: Secondary | ICD-10-CM | POA: Diagnosis not present

## 2018-05-18 DIAGNOSIS — Z882 Allergy status to sulfonamides status: Secondary | ICD-10-CM | POA: Diagnosis not present

## 2018-05-18 DIAGNOSIS — K219 Gastro-esophageal reflux disease without esophagitis: Secondary | ICD-10-CM | POA: Diagnosis not present

## 2018-05-18 DIAGNOSIS — F329 Major depressive disorder, single episode, unspecified: Secondary | ICD-10-CM | POA: Insufficient documentation

## 2018-05-18 DIAGNOSIS — I9789 Other postprocedural complications and disorders of the circulatory system, not elsewhere classified: Secondary | ICD-10-CM | POA: Diagnosis not present

## 2018-05-18 DIAGNOSIS — N183 Chronic kidney disease, stage 3 (moderate): Secondary | ICD-10-CM | POA: Diagnosis not present

## 2018-05-18 DIAGNOSIS — T81718A Complication of other artery following a procedure, not elsewhere classified, initial encounter: Secondary | ICD-10-CM | POA: Diagnosis not present

## 2018-05-18 DIAGNOSIS — Z87891 Personal history of nicotine dependence: Secondary | ICD-10-CM | POA: Diagnosis not present

## 2018-05-18 DIAGNOSIS — I729 Aneurysm of unspecified site: Secondary | ICD-10-CM

## 2018-05-18 DIAGNOSIS — Z8673 Personal history of transient ischemic attack (TIA), and cerebral infarction without residual deficits: Secondary | ICD-10-CM | POA: Diagnosis not present

## 2018-05-18 DIAGNOSIS — M349 Systemic sclerosis, unspecified: Secondary | ICD-10-CM | POA: Diagnosis not present

## 2018-05-18 DIAGNOSIS — R2681 Unsteadiness on feet: Secondary | ICD-10-CM | POA: Insufficient documentation

## 2018-05-18 DIAGNOSIS — Z9981 Dependence on supplemental oxygen: Secondary | ICD-10-CM | POA: Diagnosis not present

## 2018-05-18 DIAGNOSIS — Z79899 Other long term (current) drug therapy: Secondary | ICD-10-CM | POA: Diagnosis not present

## 2018-05-18 DIAGNOSIS — Z955 Presence of coronary angioplasty implant and graft: Secondary | ICD-10-CM | POA: Insufficient documentation

## 2018-05-18 DIAGNOSIS — I272 Pulmonary hypertension, unspecified: Secondary | ICD-10-CM | POA: Diagnosis not present

## 2018-05-18 DIAGNOSIS — I130 Hypertensive heart and chronic kidney disease with heart failure and stage 1 through stage 4 chronic kidney disease, or unspecified chronic kidney disease: Secondary | ICD-10-CM | POA: Diagnosis not present

## 2018-05-18 DIAGNOSIS — R600 Localized edema: Secondary | ICD-10-CM | POA: Diagnosis not present

## 2018-05-18 DIAGNOSIS — I509 Heart failure, unspecified: Secondary | ICD-10-CM

## 2018-05-18 DIAGNOSIS — E7800 Pure hypercholesterolemia, unspecified: Secondary | ICD-10-CM | POA: Diagnosis not present

## 2018-05-18 DIAGNOSIS — I5033 Acute on chronic diastolic (congestive) heart failure: Secondary | ICD-10-CM | POA: Insufficient documentation

## 2018-05-18 DIAGNOSIS — Z7902 Long term (current) use of antithrombotics/antiplatelets: Secondary | ICD-10-CM | POA: Diagnosis not present

## 2018-05-18 DIAGNOSIS — I2510 Atherosclerotic heart disease of native coronary artery without angina pectoris: Secondary | ICD-10-CM | POA: Diagnosis not present

## 2018-05-18 DIAGNOSIS — I724 Aneurysm of artery of lower extremity: Secondary | ICD-10-CM | POA: Insufficient documentation

## 2018-05-18 DIAGNOSIS — Z7982 Long term (current) use of aspirin: Secondary | ICD-10-CM | POA: Insufficient documentation

## 2018-05-18 DIAGNOSIS — D62 Acute posthemorrhagic anemia: Secondary | ICD-10-CM | POA: Insufficient documentation

## 2018-05-18 DIAGNOSIS — R918 Other nonspecific abnormal finding of lung field: Secondary | ICD-10-CM | POA: Diagnosis not present

## 2018-05-18 DIAGNOSIS — F419 Anxiety disorder, unspecified: Secondary | ICD-10-CM | POA: Insufficient documentation

## 2018-05-18 LAB — CBC WITH DIFFERENTIAL/PLATELET
Abs Immature Granulocytes: 0.03 10*3/uL (ref 0.00–0.07)
BASOS PCT: 0 %
Basophils Absolute: 0 10*3/uL (ref 0.0–0.1)
EOS ABS: 0.3 10*3/uL (ref 0.0–0.5)
Eosinophils Relative: 3 %
HCT: 33.1 % — ABNORMAL LOW (ref 36.0–46.0)
Hemoglobin: 9.5 g/dL — ABNORMAL LOW (ref 12.0–15.0)
IMMATURE GRANULOCYTES: 0 %
Lymphocytes Relative: 15 %
Lymphs Abs: 1.5 10*3/uL (ref 0.7–4.0)
MCH: 25.8 pg — AB (ref 26.0–34.0)
MCHC: 28.7 g/dL — ABNORMAL LOW (ref 30.0–36.0)
MCV: 89.9 fL (ref 80.0–100.0)
MONO ABS: 0.9 10*3/uL (ref 0.1–1.0)
MONOS PCT: 9 %
NEUTROS PCT: 73 %
Neutro Abs: 6.8 10*3/uL (ref 1.7–7.7)
PLATELETS: 197 10*3/uL (ref 150–400)
RBC: 3.68 MIL/uL — AB (ref 3.87–5.11)
RDW: 18.6 % — AB (ref 11.5–15.5)
WBC: 9.5 10*3/uL (ref 4.0–10.5)
nRBC: 0 % (ref 0.0–0.2)

## 2018-05-18 LAB — COMPREHENSIVE METABOLIC PANEL
ALT: 11 U/L (ref 0–44)
AST: 19 U/L (ref 15–41)
Albumin: 3.1 g/dL — ABNORMAL LOW (ref 3.5–5.0)
Alkaline Phosphatase: 50 U/L (ref 38–126)
Anion gap: 7 (ref 5–15)
BUN: 37 mg/dL — AB (ref 8–23)
CO2: 22 mmol/L (ref 22–32)
CREATININE: 1.57 mg/dL — AB (ref 0.44–1.00)
Calcium: 9.1 mg/dL (ref 8.9–10.3)
Chloride: 107 mmol/L (ref 98–111)
GFR calc Af Amer: 34 mL/min — ABNORMAL LOW (ref >60)
GFR, EST NON AFRICAN AMERICAN: 29 mL/min — AB (ref >60)
Glucose, Bld: 170 mg/dL — ABNORMAL HIGH (ref 70–99)
Potassium: 4.8 mmol/L (ref 3.5–5.1)
SODIUM: 136 mmol/L (ref 135–145)
TOTAL PROTEIN: 7 g/dL (ref 6.5–8.1)
Total Bilirubin: 0.7 mg/dL (ref 0.3–1.2)

## 2018-05-18 NOTE — ED Notes (Signed)
Pedal pulse present bil.with doppler

## 2018-05-18 NOTE — ED Provider Notes (Signed)
[place] EMERGENCY DEPARTMENT Provider Note   CSN: [id number] Arrival date & time: 05/18/18  1556     History   Chief Complaint Chief Complaint  Patient presents with  . Weakness    HPI [name] is a 84 y.o. female.  Patient presents to the emergency department with a chief complaint of right groin pain.  She reports that she had a heart catheterization performed on Thursday and Friday of last week.  She had stents placed on Friday.  She reports having some groin pain and swelling.  Contacted the on-call cardiologist, and was advised to come to the emergency department for evaluation.  She reports that she feels fatigued.  She denies any chest pain, shortness of breath, fever, chills, cough.  The history is provided by the patient. No language interpreter was used.    Past Medical History:  Diagnosis Date  . Anxiety   . Arthritis    osteo; "mostly hands, knees, probably back too" (12/09/2016)  . Chronic lower back pain   . Complication of anesthesia 2011   resp distress -on vent after surgery  . Coronary artery disease   . Depression   . Diabetes ([place])   . Esophageal stricture   . GERD (gastroesophageal reflux disease)   . Headache    out grew them  . High cholesterol   . History of blood transfusion 2011; ?03/2012   "related to ORs" (12/09/2016)  . Hypertension   . Intestinal obstruction ([place])   . Macular degeneration of left eye    dx over 55 yrs ago.....hasn't changed much  . Shingles   . Squamous carcinoma 2013   squamas cell on scalp--took 14 radiation tx--2013  . Stroke [facility]) 1967   Mini stroke;  No lasting deficits  . Type II diabetes mellitus ([place]) dx'd 11/2016    Patient Active Problem List   Diagnosis Date Noted  . Pulmonary hypertension, unspecified ([place])   . Status post coronary artery stent placement   . Coronary artery disease involving native coronary artery of native heart with angina pectoris ([place])   . Exertional  dyspnea 05/14/2018  . Abnormal CT scan, gastrointestinal tract 12/31/2016  . GI bleed 12/31/2016  . HLD (hyperlipidemia) 12/09/2016  . GERD (gastroesophageal reflux disease) 12/09/2016  . Depression 12/09/2016  . History of CVA (cerebrovascular accident) 12/09/2016  . CKD (chronic kidney disease), stage III ([place]) 12/09/2016  . Back pain 12/09/2016  . Sepsis ([place]) 12/09/2016  . History of oral surgery 12/09/2016  . Osteomyelitis of jaw 12/09/2016  . Diabetes mellitus type 2, uncontrolled ([place]) 12/09/2016  . History of Lumbar herniated disc 08/09/2014  . UTI (lower urinary tract infection) 04/08/2012  . Pneumonia 04/04/2012  . Acute blood loss anemia 04/01/2012  . Fall at home 03/31/2012  . History of shingles 03/31/2012  . Anemia 03/31/2012  . Femur fracture, left ([place]) 03/31/2012  . Hypertension 03/30/2012    Past Surgical History:  Procedure Laterality Date  . ABDOMINAL HYSTERECTOMY    . APPENDECTOMY    . BACK SURGERY    . CATARACT EXTRACTION, BILATERAL Bilateral   . CHOLECYSTECTOMY    . COLON SURGERY    . COLONOSCOPY    . CORONARY ATHERECTOMY N/A 05/15/2018   Procedure: CORONARY ATHERECTOMY;  Surgeon: Leonie Man, MD;  Location: [facility] CV LAB;  Service: Cardiovascular;  Laterality: N/A;  . CORONARY STENT INTERVENTION N/A 05/15/2018   Procedure: CORONARY STENT INTERVENTION;  Surgeon: Leonie Man, MD;  Location: [facility]  INVASIVE CV LAB;  Service: Cardiovascular;  Laterality: N/A;  . ESOPHAGOGASTRODUODENOSCOPY (EGD) WITH ESOPHAGEAL DILATION  "several times"  . ESOPHAGOGASTRODUODENOSCOPY (EGD) WITH PROPOFOL N/A 12/31/2016   Procedure: ESOPHAGOGASTRODUODENOSCOPY (EGD) WITH PROPOFOL;  Surgeon: Wilford Corner, MD;  Location: [place];  Service: Endoscopy;  Laterality: N/A;  . EYE SURGERY    . FRACTURE SURGERY    . IR FLUORO GUIDE CV LINE RIGHT  01/02/2017  . IR US GUIDE VASC ACCESS RIGHT  01/02/2017  . JOINT REPLACEMENT    . LUMBAR DISC SURGERY  10/25/2014    Right L4-L5 removal of seven free fragments of disk mostly posterolaterally.  Lysis of adhesion.  Microscope/notes 10/26/2014  . LUMBAR LAMINECTOMY/DECOMPRESSION MICRODISCECTOMY Right 08/09/2014   Procedure: Right Lumbar Four to Five Microdiskectomy;  Surgeon: Floyce Stakes, MD;  Location: [facility] NEURO ORS;  Service: Neurosurgery;  Laterality: Right;  Right L4-5 Microdiskectomy  . ORIF PERIPROSTHETIC FRACTURE  03/31/2012   Procedure: OPEN REDUCTION INTERNAL FIXATION (ORIF) PERIPROSTHETIC FRACTURE;  Surgeon: Mauri Pole, MD;  Location: [facility] ORS;  Service: Orthopedics;  Laterality: Left;  Open reduction internal fixation Left distal femur periprosthetic fracture  . RIGHT/LEFT HEART CATH AND CORONARY ANGIOGRAPHY N/A 05/14/2018   Procedure: RIGHT/LEFT HEART CATH AND CORONARY ANGIOGRAPHY;  Surgeon: Larey Dresser, MD;  Location: [place] CV LAB;  Service: Cardiovascular;  Laterality: N/A;  . SMALL INTESTINE SURGERY  2011   "really bad blockage; went into respiratory distress and was on ventilator after surgery; [facility]"  . SQUAMOUS CELL CARCINOMA EXCISION  ~ 2012   S/P "cut off her head then 15 radiation txs"   . TOTAL KNEE ARTHROPLASTY Bilateral    bilateral     OB History   None      Home Medications    Prior to Admission medications   Medication Sig Start Date End Date Taking? Authorizing Provider  ALPRAZolam Duanne Moron) 0.25 MG tablet Take 0.25 mg by mouth daily as needed for anxiety.     [provider]  aspirin EC 81 MG tablet Take 1 tablet (81 mg total) by mouth daily. 05/16/18   Bhagat, Crista Luria, PA  atorvastatin (LIPITOR) 80 MG tablet Take 1 tablet (80 mg total) by mouth daily. 05/16/18   Bhagat, Crista Luria, PA  baclofen (LIORESAL) 10 MG tablet Take 10 mg by mouth 2 (two) times daily.     [provider]  carvedilol (COREG) 12.5 MG tablet Take 1 tablet (12.5 mg total) by mouth 2 (two) times daily with a meal. 03/06/18   Josue Hector, MD  chlorhexidine  (PERIDEX) 0.12 % solution 15 mLs by Mouth Rinse route 2 (two) times daily as needed (sore gums).  05/05/18   [provider]  clopidogrel (PLAVIX) 75 MG tablet Take 1 tablet (75 mg total) by mouth daily. 05/16/18   Bhagat, Crista Luria, PA  cyclobenzaprine (FLEXERIL) 10 MG tablet Take 1 tablet (10 mg total) by mouth 3 (three) times daily as needed for muscle spasms. Patient taking differently: Take 10 mg by mouth 2 (two) times daily.  10/21/14   Leonard Schwartz, MD  gabapentin (NEURONTIN) 300 MG capsule Take 300 mg by mouth at bedtime.     [provider]  hydrochlorothiazide (HYDRODIURIL) 25 MG tablet Take 1 tablet (25 mg total) by mouth daily. 03/06/18   Josue Hector, MD  lactose free nutrition (BOOST) LIQD Take 237 mLs by mouth daily at 2 PM.    [provider]  macitentan (OPSUMIT) 10 MG tablet Take 1 tablet (10  mg total) by mouth daily. Will need appointment with Dr. Aundra Dubin for further refills 05/16/18   Leanor Kail, PA  mirtazapine (REMERON) 30 MG tablet Take 30 mg by mouth at bedtime.    [provider]  pantoprazole (PROTONIX) 40 MG tablet Take 1 tablet (40 mg total) by mouth 2 (two) times daily. Patient taking differently: Take 40 mg by mouth daily.  01/02/17   Ghimire, Henreitta Leber, MD  potassium chloride SA (K-DUR,KLOR-CON) 20 MEQ tablet Take 1 tablet (20 mEq total) by mouth daily. 05/08/18   Larey Dresser, MD  sildenafil (REVATIO) 20 MG tablet Take 1 tablet (20 mg total) by mouth 3 (three) times daily. 03/06/18   Josue Hector, MD  traMADol-acetaminophen (ULTRACET) 37.5-325 MG tablet Take 2 tablets by mouth 4 (four) times daily as needed for pain. 05/07/18   [provider]    Family History History reviewed. No pertinent family history.  Social History Social History   Tobacco Use  . Smoking status: Former Smoker    Packs/day: 0.10    Years: 10.00    Pack years: 1.00    Types: Cigarettes    Last attempt to quit: 03/31/1991     Years since quitting: 27.1  . Smokeless tobacco: Never Used  Substance Use Topics  . Alcohol use: No  . Drug use: No     Allergies   Sulfa antibiotics   Review of Systems Review of Systems  All other systems reviewed and are negative.    Physical Exam Updated Vital Signs BP (!) 141/64   Pulse 94   Temp 98.8 F (37.1 C)   Resp 15   SpO2 96%   Physical Exam  Constitutional: She is oriented to person, place, and time. She appears well-developed and well-nourished.  HENT:  Head: Normocephalic and atraumatic.  Eyes: Pupils are equal, round, and reactive to light. Conjunctivae and EOM are normal.  Neck: Normal range of motion. Neck supple.  Cardiovascular: Normal rate and regular rhythm. Exam reveals no gallop and no friction rub.  No murmur heard. Pulmonary/Chest: Effort normal and breath sounds normal. No respiratory distress. She has no wheezes. She has no rales. She exhibits no tenderness.  Abdominal: Soft. Bowel sounds are normal. She exhibits no distension and no mass. There is no tenderness. There is no rebound and no guarding.  Musculoskeletal: Normal range of motion. She exhibits no edema or tenderness.  Neurological: She is alert and oriented to person, place, and time.  Skin: Skin is warm and dry.  Fairly significant ecchymosis around right groin and medial thigh with some warmth and tenderness, no pulsatile masses  Psychiatric: She has a normal mood and affect. Her behavior is normal. Judgment and thought content normal.  Nursing note and vitals reviewed.    ED Treatments / Results  Labs (all labs ordered are listed, but only abnormal results are displayed) Labs Reviewed  CBC WITH DIFFERENTIAL/PLATELET - Abnormal; Notable for the following components:      Result Value   RBC 3.68 (*)    Hemoglobin 9.5 (*)    HCT 33.1 (*)    MCH 25.8 (*)    MCHC 28.7 (*)    RDW 18.6 (*)    All other components within normal limits  COMPREHENSIVE METABOLIC PANEL -  Abnormal; Notable for the following components:   Glucose, Bld 170 (*)    BUN 37 (*)    Creatinine, Ser 1.57 (*)    Albumin 3.1 (*)    GFR calc  non Af Amer 29 (*)    GFR calc Af Amer 34 (*)    All other components within normal limits  URINALYSIS, ROUTINE W REFLEX MICROSCOPIC    EKG EKG Interpretation  Date/Time:  Monday May 18 2018 21:11:28 EDT Ventricular Rate:  83 PR Interval:  176 QRS Duration: 88 QT Interval:  378 QTC Calculation: 444 R Axis:   99 Text Interpretation:  Sinus rhythm with frequent Premature ventricular complexes Rightward axis ST & T wave abnormality, consider inferolateral ischemia Abnormal ECG Confirmed by Fredia Sorrow [phone]) on 05/18/2018 10:35:42 PM   Radiology Dg Chest 2 View  Result Date: 05/18/2018 CLINICAL DATA:  Postoperative fatigue. EXAM: CHEST - 2 VIEW COMPARISON:  05/14/2018 FINDINGS: New streaky densities at the left lung base. Linear density at the right lung base. Linear density in the mid peripheral left lung. Upper lungs remain clear. Heart size is within normal limits and stable. Atherosclerotic calcifications at the aortic arch. No large pleural effusions. IMPRESSION: Streaky lung densities most prominent at the left lung base. Left basilar disease could represent atelectasis and/or infection. Electronically Signed   By: Markus Daft M.D.   On: 05/18/2018 17:34   [place] Extrem Low Right Ltd  Result Date: 05/19/2018 CLINICAL DATA:  Swelling and pain in the right groin, status post cardiac catheterization 05/14/2018 EXAM: ULTRASOUND right LOWER EXTREMITY LIMITED TECHNIQUE: Ultrasound examination of the lower extremity soft tissues was performed in the area of clinical concern. COMPARISON:  None. FINDINGS: Targeted ultrasound of the right groin performed in the region of concern. There is echogenic edema within the subcutaneous soft tissues. Approximate 1.5 x 1.2 cm pseudoaneurysm in the right groin. Slightly tortuous neck contiguous with the  right common femoral artery. IMPRESSION: Edema within the soft tissues of the right groin. Positive for small pseudoaneurysm arising from the right common femoral artery. Electronically Signed   By: Donavan Foil M.D.   On: 05/19/2018 01:18    Procedures Procedures (including critical care time)  Medications Ordered in ED Medications - No data to display   Initial Impression / Assessment and Plan / ED Course  I have reviewed the triage vital signs and the nursing notes.  Pertinent labs & imaging results that were available during my care of the patient were reviewed by me and considered in my medical decision making (see chart for details).     Patient with right groin pain and swelling.  Had heart catheterization performed on Thursday and Friday of last week.  Noticed the increased pain and swelling yesterday.  Was advised to come to the emergency department for evaluation by cardiology.  Seen by discussed with Dr. Rogene Houston, who recommends cardiology consultation.  At this time, the patient is stable.  Her hemoglobin not significantly changed from her recent hospitalization.  There is no pulsatile mass.  I discussed the patient with the on-call cardiologist, who recommends ultrasound for further evaluation.  Ultrasound shows small pseudoaneurysm.  Cardiology will admit for observation.  Final Clinical Impressions(s) / ED Diagnoses   Final diagnoses:  Pseudoaneurysm following procedure [facility])    ED Discharge Orders    None       Montine Circle, PA-C 05/19/18 0246    Fredia Sorrow, MD 05/25/18 [phone]

## 2018-05-18 NOTE — ED Provider Notes (Signed)
Medical screening examination/treatment/procedure(s) were conducted as a shared visit with non-physician practitioner(s) and myself.  I personally evaluated the patient during the encounter.  EKG Interpretation  Date/Time:  Monday May 18 2018 21:11:28 EDT Ventricular Rate:  83 PR Interval:  176 QRS Duration: 88 QT Interval:  378 QTC Calculation: 444 R Axis:   99 Text Interpretation:  Sinus rhythm with frequent Premature ventricular complexes Rightward axis ST & T wave abnormality, consider inferolateral ischemia Abnormal ECG Confirmed by Fredia Sorrow [phone]) on 05/18/2018 10:35:42 PM  Patient seen by me along with physician assistant.  Patient on Thursday and Friday he had cardiac catheterizations involving the right groin area.  On Friday they finally got the stents placed.  Patient was feeling weak today the family members noticed that patient had some increased bruising in the area so they contacted on-call cardiology as per their instructions and cardiology recommended the patient come in for evaluation.  Patient nontoxic no acute distress.  Does have a fair amount of right groin swelling no active bleeding there is a pulses really no pulsatile mass.  Distally Doppler pulses are present in the right foot.  Also cap refill is about 2 seconds.  No significant lower extremity swelling.  Patient's labs significant for a little bit of anemia but hemoglobin is above 8.  Do not feel that she requires a blood transfusion.  Patient is not febrile.  Physician assistant will talk with the cardiology fellow to see if there is anything in addition all that they want to do.  Family members do state that cardiology is planning on transfusing the patient on Friday not clear 100% Y.  But we will discuss this with the cardiology fellow.   Fredia Sorrow, MD 05/18/18 [phone]

## 2018-05-18 NOTE — ED Notes (Signed)
Pt had a cardiac cath on Thursday and again on Friday.  Pt was discharged home on Sat.  Pt's daughters st's they started noticing swelling in right leg earlier today.  Pt has bruising to right groin with swelling down to foot

## 2018-05-18 NOTE — ED Provider Notes (Signed)
Patient placed in Quick Look pathway, seen and evaluated   Chief Complaint: fatigue s/p coronary stent placement  HPI:   Discharged on Saturday s/p coronary stent placement. Reports they wanted to transfuse her in hospital because of anemia but declined and wanted discharge. Reports she felt well 1st day home, however since then has had increased fatigue. Some swelling to groin at surgical site as well. Denies fever, CP, SOB.  ROS: + fatigue, +leg pain  Physical Exam:   Gen: No distress  Neuro: Awake and Alert  Skin: Warm    Focused Exam: systolic murmur, crackles b/l lung bases. Large ecchymosis in right groin. abd soft and nontender. RLE well perfused.    Initiation of care has begun. The patient has been counseled on the process, plan, and necessity for staying for the completion/evaluation, and the remainder of the medical screening examination    Vencent Hauschild, [place] N, PA-C 05/18/18 1656    Fredia Sorrow, MD 05/18/18 2318

## 2018-05-18 NOTE — ED Triage Notes (Signed)
Pt in from home, recently admitted and had a heart cath completed, while here was told she needed a blood transfusion but at that time pt refused, today patient is feeling weaker again and more tired, also having swelling at the cath insertion site, denies shortness of breath arrived on home O2 at 2L

## 2018-05-18 NOTE — Telephone Encounter (Signed)
Spoke with pt's daughter who states pt groin site has developed swelling and warm to touch. She report pt also is have some swelling in the ankle of same leg. She denies any numbness/tingling or discoloration. Per Dr. Ellyn Hack, he would recommend pt go to ED for further evaluations. Daughter verbalized understanding. Engineer, maintenance (IT) notified.

## 2018-05-18 NOTE — Telephone Encounter (Signed)
New Message        Patient's daughter is calling today because the patient leg is swelling and warm to touch.

## 2018-05-19 ENCOUNTER — Observation Stay (HOSPITAL_COMMUNITY): Payer: Medicare Other

## 2018-05-19 ENCOUNTER — Emergency Department (HOSPITAL_COMMUNITY): Payer: Medicare Other

## 2018-05-19 ENCOUNTER — Other Ambulatory Visit: Payer: Self-pay

## 2018-05-19 ENCOUNTER — Encounter (HOSPITAL_COMMUNITY): Payer: Self-pay

## 2018-05-19 DIAGNOSIS — I509 Heart failure, unspecified: Secondary | ICD-10-CM | POA: Diagnosis not present

## 2018-05-19 DIAGNOSIS — I724 Aneurysm of artery of lower extremity: Secondary | ICD-10-CM

## 2018-05-19 DIAGNOSIS — N179 Acute kidney failure, unspecified: Secondary | ICD-10-CM | POA: Diagnosis not present

## 2018-05-19 DIAGNOSIS — D649 Anemia, unspecified: Secondary | ICD-10-CM | POA: Diagnosis not present

## 2018-05-19 DIAGNOSIS — R600 Localized edema: Secondary | ICD-10-CM | POA: Diagnosis not present

## 2018-05-19 DIAGNOSIS — T81718A Complication of other artery following a procedure, not elsewhere classified, initial encounter: Secondary | ICD-10-CM | POA: Diagnosis present

## 2018-05-19 LAB — CBC
HCT: 32.1 % — ABNORMAL LOW (ref 36.0–46.0)
HEMATOCRIT: 30.3 % — AB (ref 36.0–46.0)
Hemoglobin: 9.4 g/dL — ABNORMAL LOW (ref 12.0–15.0)
Hemoglobin: 8.8 g/dL — ABNORMAL LOW (ref 12.0–15.0)
MCH: 25.7 pg — ABNORMAL LOW (ref 26.0–34.0)
MCH: 25.3 pg — ABNORMAL LOW (ref 26.0–34.0)
MCHC: 29.3 g/dL — ABNORMAL LOW (ref 30.0–36.0)
MCHC: 29 g/dL — AB (ref 30.0–36.0)
MCV: 87.7 fL (ref 80.0–100.0)
MCV: 87.1 fL (ref 80.0–100.0)
Platelets: 192 10*3/uL (ref 150–400)
Platelets: 187 10*3/uL (ref 150–400)
RBC: 3.66 MIL/uL — ABNORMAL LOW (ref 3.87–5.11)
RBC: 3.48 MIL/uL — ABNORMAL LOW (ref 3.87–5.11)
RDW: 18.5 % — ABNORMAL HIGH (ref 11.5–15.5)
RDW: 18.8 % — AB (ref 11.5–15.5)
WBC: 10.2 10*3/uL (ref 4.0–10.5)
WBC: 9.8 10*3/uL (ref 4.0–10.5)
nRBC: 0 % (ref 0.0–0.2)
nRBC: 0 % (ref 0.0–0.2)

## 2018-05-19 LAB — BASIC METABOLIC PANEL
Anion gap: 12 (ref 5–15)
BUN: 33 mg/dL — ABNORMAL HIGH (ref 8–23)
CO2: 20 mmol/L — ABNORMAL LOW (ref 22–32)
Calcium: 9.1 mg/dL (ref 8.9–10.3)
Chloride: 106 mmol/L (ref 98–111)
Creatinine, Ser: 1.37 mg/dL — ABNORMAL HIGH (ref 0.44–1.00)
GFR calc Af Amer: 40 mL/min — ABNORMAL LOW (ref >60)
GFR calc non Af Amer: 34 mL/min — ABNORMAL LOW (ref >60)
Glucose, Bld: 181 mg/dL — ABNORMAL HIGH (ref 70–99)
Potassium: 4.1 mmol/L (ref 3.5–5.1)
Sodium: 138 mmol/L (ref 135–145)

## 2018-05-19 LAB — BLOOD GAS, ARTERIAL
Acid-base deficit: 3.4 mmol/L — ABNORMAL HIGH (ref 0.0–2.0)
BICARBONATE: 20.9 mmol/L (ref 20.0–28.0)
DRAWN BY: 30136
O2 Content: 2 L/min
O2 Saturation: 82.5 %
PH ART: 7.377 (ref 7.350–7.450)
PO2 ART: 50.4 mmHg — AB (ref 83.0–108.0)
Patient temperature: 98.4
pCO2 arterial: 36.3 mmHg (ref 32.0–48.0)

## 2018-05-19 LAB — GLUCOSE, CAPILLARY: Glucose-Capillary: 153 mg/dL — ABNORMAL HIGH (ref 70–99)

## 2018-05-19 MED ORDER — CARVEDILOL 12.5 MG PO TABS
12.5000 mg | ORAL_TABLET | Freq: Two times a day (BID) | ORAL | Status: DC
  Filled 2018-05-19: qty 1

## 2018-05-19 MED ORDER — MACITENTAN 10 MG PO TABS: 10.0000 mg | ORAL_TABLET | Freq: Every day | ORAL | Status: DC

## 2018-05-19 MED ORDER — HYDROCHLOROTHIAZIDE 25 MG PO TABS: 25.0000 mg | ORAL_TABLET | Freq: Every day | ORAL | Status: DC

## 2018-05-19 MED ORDER — TRAMADOL-ACETAMINOPHEN 37.5-325 MG PO TABS
2.0000 | ORAL_TABLET | Freq: Four times a day (QID) | ORAL | Status: DC | PRN
  Administered 2018-05-19 – 2018-05-20 (×2): 2 via ORAL
  Filled 2018-05-19 (×2): qty 2

## 2018-05-19 MED ORDER — NALOXONE HCL 0.4 MG/ML IJ SOLN
0.2000 mg | INTRAMUSCULAR | Status: DC | PRN
  Administered 2018-05-19: 0.2 mg via INTRAVENOUS
  Filled 2018-05-19: qty 1

## 2018-05-19 MED ORDER — ACETAMINOPHEN 325 MG PO TABS: 650.0000 mg | ORAL_TABLET | Freq: Four times a day (QID) | ORAL | Status: DC | PRN

## 2018-05-19 MED ORDER — PANTOPRAZOLE SODIUM 40 MG PO TBEC
40.0000 mg | DELAYED_RELEASE_TABLET | Freq: Every day | ORAL | Status: DC
  Administered 2018-05-19 – 2018-05-21 (×3): 40 mg via ORAL
  Filled 2018-05-19 (×3): qty 1

## 2018-05-19 MED ORDER — MIRTAZAPINE 30 MG PO TABS
30.0000 mg | ORAL_TABLET | Freq: Every day | ORAL | Status: DC
  Administered 2018-05-20 (×2): 30 mg via ORAL
  Filled 2018-05-19 (×3): qty 1

## 2018-05-19 MED ORDER — ASPIRIN EC 81 MG PO TBEC
81.0000 mg | DELAYED_RELEASE_TABLET | Freq: Every day | ORAL | Status: DC
  Administered 2018-05-19 – 2018-05-21 (×3): 81 mg via ORAL
  Filled 2018-05-19 (×3): qty 1

## 2018-05-19 MED ORDER — GABAPENTIN 300 MG PO CAPS
300.0000 mg | ORAL_CAPSULE | Freq: Every day | ORAL | Status: DC
  Administered 2018-05-20 (×2): 300 mg via ORAL
  Filled 2018-05-19 (×2): qty 1

## 2018-05-19 MED ORDER — MACITENTAN 10 MG PO TABS
10.0000 mg | ORAL_TABLET | Freq: Once | ORAL | Status: AC
  Administered 2018-05-19: 10 mg via ORAL
  Filled 2018-05-19: qty 1

## 2018-05-19 MED ORDER — BACLOFEN 10 MG PO TABS
10.0000 mg | ORAL_TABLET | Freq: Two times a day (BID) | ORAL | Status: DC
  Administered 2018-05-19: 10 mg via ORAL
  Filled 2018-05-19 (×2): qty 1

## 2018-05-19 MED ORDER — FUROSEMIDE 10 MG/ML IJ SOLN
40.0000 mg | Freq: Two times a day (BID) | INTRAMUSCULAR | Status: AC
  Administered 2018-05-19 (×2): 40 mg via INTRAVENOUS
  Filled 2018-05-19 (×2): qty 4

## 2018-05-19 MED ORDER — ORAL CARE MOUTH RINSE
15.0000 mL | Freq: Two times a day (BID) | OROMUCOSAL | Status: DC
  Administered 2018-05-19 – 2018-05-21 (×4): 15 mL via OROMUCOSAL

## 2018-05-19 MED ORDER — SILDENAFIL CITRATE 20 MG PO TABS
20.0000 mg | ORAL_TABLET | Freq: Three times a day (TID) | ORAL | Status: DC
  Administered 2018-05-19 – 2018-05-21 (×7): 20 mg via ORAL
  Filled 2018-05-19 (×8): qty 1

## 2018-05-19 MED ORDER — CARVEDILOL 6.25 MG PO TABS: 6.2500 mg | ORAL_TABLET | Freq: Two times a day (BID) | ORAL | Status: DC

## 2018-05-19 MED ORDER — ONDANSETRON HCL 4 MG/2ML IJ SOLN: 4.0000 mg | Freq: Four times a day (QID) | INTRAMUSCULAR | Status: DC | PRN

## 2018-05-19 MED ORDER — CARVEDILOL 3.125 MG PO TABS
3.1250 mg | ORAL_TABLET | Freq: Two times a day (BID) | ORAL | Status: DC
  Administered 2018-05-20 – 2018-05-21 (×4): 3.125 mg via ORAL
  Filled 2018-05-19 (×4): qty 1

## 2018-05-19 MED ORDER — CYCLOBENZAPRINE HCL 10 MG PO TABS
10.0000 mg | ORAL_TABLET | Freq: Two times a day (BID) | ORAL | Status: DC
  Administered 2018-05-19 – 2018-05-21 (×4): 10 mg via ORAL
  Filled 2018-05-19 (×4): qty 1

## 2018-05-19 MED ORDER — ALPRAZOLAM 0.25 MG PO TABS
0.2500 mg | ORAL_TABLET | Freq: Every day | ORAL | Status: DC | PRN
  Administered 2018-05-20: 0.25 mg via ORAL
  Filled 2018-05-19: qty 1

## 2018-05-19 MED ORDER — MACITENTAN 10 MG PO TABS
10.0000 mg | ORAL_TABLET | Freq: Every day | ORAL | Status: DC
  Administered 2018-05-20 – 2018-05-21 (×2): 10 mg via ORAL
  Filled 2018-05-19 (×2): qty 1

## 2018-05-19 MED ORDER — CLOPIDOGREL BISULFATE 75 MG PO TABS
75.0000 mg | ORAL_TABLET | Freq: Every day | ORAL | Status: DC
  Administered 2018-05-19 – 2018-05-21 (×3): 75 mg via ORAL
  Filled 2018-05-19 (×3): qty 1

## 2018-05-19 MED ORDER — SODIUM CHLORIDE 0.9 % IV SOLN
INTRAVENOUS | Status: AC
  Administered 2018-05-19: 05:00:00 via INTRAVENOUS

## 2018-05-19 MED ORDER — ATORVASTATIN CALCIUM 20 MG PO TABS
80.0000 mg | ORAL_TABLET | Freq: Every day | ORAL | Status: DC
  Administered 2018-05-19 – 2018-05-21 (×3): 80 mg via ORAL
  Filled 2018-05-19 (×2): qty 4
  Filled 2018-05-19: qty 8
  Filled 2018-05-19: qty 4

## 2018-05-19 NOTE — Progress Notes (Signed)
Notified MD due to patients low bp, sbp 80's and patients low o2 sats and requiring more o2. MD to bedside, assessed patient and requested lasix IV even with low bp. Lasix given. Will continue to monitor.

## 2018-05-19 NOTE — Care Management Note (Signed)
Case Management Note  Patient Details  Name: [name] MRN: [id number] Date of Birth: 06/14/1932  Subjective/Objective:    Femoral artery pseudaneurysm               Action/Plan: Patient lives at home; Primary Physician: [name], PA-C; has private insurance with [facility] with prescription drug coverage; CM following for progression of care.  Expected Discharge Date:  Undetermined at this time               Expected Discharge Plan:   home with possible HHC when medically stable  Status of Service:   In progress  Sherrilyn Rist [phone] 05/19/2018, 3:47 PM

## 2018-05-19 NOTE — ED Notes (Signed)
IV attempts x2 without success.

## 2018-05-19 NOTE — Progress Notes (Addendum)
Advanced Heart Failure Rounding Note  PCP-Cardiologist: No primary care provider on file.   Subjective:    Pt discharged 05/16/18 after a complex PCI requiring atherectomy of an LAD diagonal bifurcation lesion on 10/11.  She was discharged on 10/12; her hemoglobin at that time was 9.9 (drop of 2 g overnight) and hematoma noted   Presented to [facility] 05/18/18 with groin pain. R groin [place] 05/18/18 with 1.5 x 1.2 cm pseudoaneurysm  Feeling groggy this morning, but states she always feels "bad" when she first wakes up. Denies pain or SOB. Groin pain has improved. Denies bleeding.   Hgb 9.5 -> 9.4. Cr 1.57 -> 1.37.   Objective:   Weight Range: 66.5 kg Body mass index is 26.81 kg/m.   Vital Signs:   Temp:  [98.4 F (36.9 C)-99.7 F (37.6 C)] 99.7 F (37.6 C) (10/15 0751) Pulse Rate:  [78-99] 92 (10/15 0751) Resp:  [14-24] 18 (10/15 0751) BP: (98-181)/(43-71) 98/48 (10/15 0751) SpO2:  [90 %-98 %] 94 % (10/15 0751) Weight:  [66.5 kg] 66.5 kg (10/15 0351) Last BM Date: 05/17/18  Weight change: Filed Weights   05/19/18 0351  Weight: 66.5 kg    Intake/Output:   Intake/Output Summary (Last 24 hours) at 05/19/2018 0803 Last data filed at 05/19/2018 0600 Gross per 24 hour  Intake 105.33 ml  Output -  Net 105.33 ml      Physical Exam    General:  Elderly appearing. No resp difficulty HEENT: Normal Neck: Supple. JVP not elevated. Carotids 2+ bilat; no bruits. No lymphadenopathy or thyromegaly appreciated. Cor: PMI nondisplaced. Regular rate & rhythm. No rubs, gallops or murmurs. Lungs: Clear Abdomen: Soft, nontender, nondistended. No hepatosplenomegaly. No bruits or masses. Good bowel sounds. Extremities: No cyanosis, clubbing, rash, or edema. R groin with significant ecchymosis. Small hematoma noted. Warm to the touch.  Neuro: Alert & orientedx3, cranial nerves grossly intact. moves all 4 extremities w/o difficulty. Affect pleasant  Telemetry   NSR 70-80s, personally  reviewed.   EKG    NSR 83 bpm, personally reviewed.   Labs    CBC Recent Labs    05/18/18 1711 05/19/18 0632  WBC 9.5 10.2  NEUTROABS 6.8  --   HGB 9.5* 9.4*  HCT 33.1* 32.1*  MCV 89.9 87.7  PLT 197 471   Basic Metabolic Panel Recent Labs    05/18/18 1711  NA 136  K 4.8  CL 107  CO2 22  GLUCOSE 170*  BUN 37*  CREATININE 1.57*  CALCIUM 9.1   Liver Function Tests Recent Labs    05/18/18 1711  AST 19  ALT 11  ALKPHOS 50  BILITOT 0.7  PROT 7.0  ALBUMIN 3.1*   No results for input(s): LIPASE, AMYLASE in the last 72 hours. Cardiac Enzymes No results for input(s): CKTOTAL, CKMB, CKMBINDEX, TROPONINI in the last 72 hours.  BNP: BNP (last 3 results) Recent Labs    05/08/18 1201  BNP 1,105.0*    ProBNP (last 3 results) No results for input(s): PROBNP in the last 8760 hours.   D-Dimer No results for input(s): DDIMER in the last 72 hours. Hemoglobin A1C No results for input(s): HGBA1C in the last 72 hours. Fasting Lipid Panel No results for input(s): CHOL, HDL, LDLCALC, TRIG, CHOLHDL, LDLDIRECT in the last 72 hours. Thyroid Function Tests No results for input(s): TSH, T4TOTAL, T3FREE, THYROIDAB in the last 72 hours.  Invalid input(s): FREET3  Other results:   Imaging    Dg Chest 2 View  Result Date: 05/18/2018 CLINICAL DATA:  Postoperative fatigue. EXAM: CHEST - 2 VIEW COMPARISON:  05/14/2018 FINDINGS: New streaky densities at the left lung base. Linear density at the right lung base. Linear density in the mid peripheral left lung. Upper lungs remain clear. Heart size is within normal limits and stable. Atherosclerotic calcifications at the aortic arch. No large pleural effusions. IMPRESSION: Streaky lung densities most prominent at the left lung base. Left basilar disease could represent atelectasis and/or infection. Electronically Signed   By: Markus Daft M.D.   On: 05/18/2018 17:34   [place] Extrem Low Right Ltd  Result Date: 05/19/2018 CLINICAL  DATA:  Swelling and pain in the right groin, status post cardiac catheterization 05/14/2018 EXAM: ULTRASOUND right LOWER EXTREMITY LIMITED TECHNIQUE: Ultrasound examination of the lower extremity soft tissues was performed in the area of clinical concern. COMPARISON:  None. FINDINGS: Targeted ultrasound of the right groin performed in the region of concern. There is echogenic edema within the subcutaneous soft tissues. Approximate 1.5 x 1.2 cm pseudoaneurysm in the right groin. Slightly tortuous neck contiguous with the right common femoral artery. IMPRESSION: Edema within the soft tissues of the right groin. Positive for small pseudoaneurysm arising from the right common femoral artery. Electronically Signed   By: Donavan Foil M.D.   On: 05/19/2018 01:18      Medications:     Scheduled Medications: . aspirin EC  81 mg Oral Daily  . atorvastatin  80 mg Oral Daily  . baclofen  10 mg Oral BID  . carvedilol  12.5 mg Oral BID WC  . clopidogrel  75 mg Oral Daily  . cyclobenzaprine  10 mg Oral BID  . gabapentin  300 mg Oral QHS  . [START ON 05/20/2018] macitentan  10 mg Oral Daily  . mouth rinse  15 mL Mouth Rinse BID  . mirtazapine  30 mg Oral QHS  . pantoprazole  40 mg Oral Daily  . sildenafil  20 mg Oral TID     Infusions:   PRN Medications:  ALPRAZolam, ondansetron (ZOFRAN) IV, traMADol-acetaminophen    Patient Profile   [name] is a 84 y.o. female with PMH of DM, HTN, osteomyelitis, pulmonary HTN, RV failure, and PNA in 10/2017.  She was admitted after [facility] 10/10. Discharged on 10/12 19 after complex PCI  Returned to [facility] 05/18/18 with R groin pain.   Assessment/Plan   1. R groin pseudoaneurysm - R groin [place] 05/18/18 with 1.5 x 1.2 cm pseudoaneurysm from procedure 05/15/18. - Discomfort has significantly improved.  - Will ask vascular to assess for the possibility of ultrasound-guided thrombin injection vs serial ultrasound assessments. 2. CAD:  - Coronary  angiography showed complex bifurcation lesion with 95% stenosis proximal LAD with calcification at take-off of large D2. The ostial/proximal D2 also had 95% calcified stenosis. I suspect this lesion plays a role in her exertional dyspnea and also in her mildly decreased LV systolic function (EF 79-39% on echo).  - s/p successful atherectomy with bifurcation stenting of proximal LAD and D2 last week.  - Continue ASA 81, statin, and Plavix.  - No s/s of ischemia.    3.Pulmonary hypertension: She appearedto have at least moderate pulmonary hypertension by echo with RV failure. CT chest showed mild emphysema, which should not explain her degree of RV failure. V/Q scan did not show evidence for chronic PEs.Anti-centromere antibody, ANA, and RF were all positive. ?CREST variant of scleroderma.She was empirically started on sildenafil 20 mg tidby Dr. Francella Solian much effect  so far. She remains quite symptomatic and is on home oxygen.[place] 05/15/18 showed moderate PAH but very high PVR and low cardiac output. This is concerning for advanced pulmonary hypertension.  - Continue sildenafil 20 mg TID  - Recently ordered Opsumit 10 mg daily. Has been approved, but she has not yet received her home supply.  - Eventually plan selexipag  - PFTs ordered. - She will need eventual sleep study.  4. Chronic diastolic CHF/RV failure: LV and RV filling pressures are well-compensated on RHC today.  - Creatinine up to 1.5 from discharge. Pending today.  - Holding lasix.  5. Diabetes:  - SSI while in house.   Medication concerns reviewed with patient and pharmacy team. Barriers identified: None at this time.   Length of Stay: 0  Annamaria Helling  05/19/2018, 8:03 AM  Advanced Heart Failure Team Pager [phone] (M-F; 7a - 4p)  Please contact [facility] for night-coverage after hours (4p -7a ) and weekends on [email or web address]   Patient seen with PA, agree with the above note.    Patient was  readmitted with groin hematoma, PSA found on US done last night.  Hgb stable overnight, she does not have much pain in her groin today.   - Continue ASA/Plavix/statin.  - Will ask vascular to evaluate, suspect she will need US-guided thrombin injection.  - No transfusion needed at this time.   Creatinine up to 1.5 last night, down to 1.37 with hydration.  She is taking po normally, can hold further IV fluid but also will hold off on Lasix.   She will need ongoing treatment for severe PAH.  She is on sildenafil and we have been working on getting Opsumit to her.  Also plan to add selexipag.   Loralie Champagne 05/19/2018  8:56 AM

## 2018-05-19 NOTE — H&P (Signed)
Cardiology History & Physical    Patient ID: [name] MRN: [id number], DOB: Nov 13, 1931 Date of Encounter: 05/19/2018, 2:31 AM Primary Physician: [name], PA-C  Chief Complaint: Right groin pain   HPI: [name] is a 84 y.o. female with history of coronary disease status post recent complex PCI of LAD/diagonal bifurcation lesion, pulmonary hypertension and RV failure, who presents with right groin pain and swelling.  The patient was just discharged after a complex PCI requiring atherectomy of an LAD diagonal bifurcation lesion on 10/11.  She was discharged on 10/12; her hemoglobin at that time was 9.9.  Of note, she required 7 French access in the RFA (ultrasound guided) due to the complexity of the lesion and need for atherectomy.  Due to tract bleeding at the end of the case, her sheath was upsized to an 8 [place].  Upon going home, the routine procedural discomfort in her right groin had improved.  Today it was significantly worse, and she noted increasing ecchymosis and swelling down the lower extremity.  She denied any neurologic symptoms in the right leg.  Otherwise, she has had no chest pain, shortness of breath or other notable symptoms.  Given progressive pain in the right groin she presented to [facility] for evaluation.  In the ED, he was mildly hypertensive upon presentation, but the remainder of her vital signs were within normal limits.  Her labs showed a hemoglobin of 9.5, which was stable from discharge.  Her creatinine was 1.57 which had risen from 0.99 on discharge.  She underwent a right groin ultrasound, that showed a 1.5 x 1.2 cm pseudoaneurysm.  She was then admitted to the cardiology service for further management of the pseudoaneurysm.  Past Medical History:  Diagnosis Date  . Anxiety   . Arthritis    osteo; "mostly hands, knees, probably back too" (12/09/2016)  . Chronic lower back pain   . Complication of anesthesia 2011   resp distress -on vent  after surgery  . Coronary artery disease   . Depression   . Diabetes ([place])   . Esophageal stricture   . GERD (gastroesophageal reflux disease)   . Headache    out grew them  . High cholesterol   . History of blood transfusion 2011; ?03/2012   "related to ORs" (12/09/2016)  . Hypertension   . Intestinal obstruction ([place])   . Macular degeneration of left eye    dx over 55 yrs ago.....hasn't changed much  . Shingles   . Squamous carcinoma 2013   squamas cell on scalp--took 14 radiation tx--2013  . Stroke [facility]) 1967   Mini stroke;  No lasting deficits  . Type II diabetes mellitus ([place]) dx'd 11/2016     Surgical History:  Past Surgical History:  Procedure Laterality Date  . ABDOMINAL HYSTERECTOMY    . APPENDECTOMY    . BACK SURGERY    . CATARACT EXTRACTION, BILATERAL Bilateral   . CHOLECYSTECTOMY    . COLON SURGERY    . COLONOSCOPY    . CORONARY ATHERECTOMY N/A 05/15/2018   Procedure: CORONARY ATHERECTOMY;  Surgeon: Leonie Man, MD;  Location: [facility];  Service: Cardiovascular;  Laterality: N/A;  . CORONARY STENT INTERVENTION N/A 05/15/2018   Procedure: CORONARY STENT INTERVENTION;  Surgeon: Leonie Man, MD;  Location: [place] [facility];  Service: Cardiovascular;  Laterality: N/A;  . ESOPHAGOGASTRODUODENOSCOPY (EGD) WITH ESOPHAGEAL DILATION  "several times"  . ESOPHAGOGASTRODUODENOSCOPY (EGD) WITH PROPOFOL N/A 12/31/2016  Procedure: ESOPHAGOGASTRODUODENOSCOPY (EGD) WITH PROPOFOL;  Surgeon: Wilford Corner, MD;  Location: [facility];  Service: Endoscopy;  Laterality: N/A;  . EYE SURGERY    . FRACTURE SURGERY    . IR FLUORO GUIDE CV LINE RIGHT  01/02/2017  . IR US GUIDE VASC ACCESS RIGHT  01/02/2017  . JOINT REPLACEMENT    . LUMBAR DISC SURGERY  10/25/2014   Right L4-L5 removal of seven free fragments of disk mostly posterolaterally.  Lysis of adhesion.  Microscope/notes 10/26/2014  . LUMBAR LAMINECTOMY/DECOMPRESSION MICRODISCECTOMY Right 08/09/2014    Procedure: Right Lumbar Four to Five Microdiskectomy;  Surgeon: Floyce Stakes, MD;  Location: [facility];  Service: Neurosurgery;  Laterality: Right;  Right L4-5 Microdiskectomy  . ORIF PERIPROSTHETIC FRACTURE  03/31/2012   Procedure: OPEN REDUCTION INTERNAL FIXATION (ORIF) PERIPROSTHETIC FRACTURE;  Surgeon: Mauri Pole, MD;  Location: [facility];  Service: Orthopedics;  Laterality: Left;  Open reduction internal fixation Left distal femur periprosthetic fracture  . RIGHT/LEFT HEART CATH AND CORONARY ANGIOGRAPHY N/A 05/14/2018   Procedure: RIGHT/LEFT HEART CATH AND CORONARY ANGIOGRAPHY;  Surgeon: Larey Dresser, MD;  Location: [place] [facility];  Service: Cardiovascular;  Laterality: N/A;  . SMALL INTESTINE SURGERY  2011   "really bad blockage; went into respiratory distress and was on ventilator after surgery; [facility]"  . SQUAMOUS CELL CARCINOMA EXCISION  ~ 2012   S/P "cut off her head then 15 radiation txs"   . TOTAL KNEE ARTHROPLASTY Bilateral    bilateral     Home Meds: Prior to Admission medications   Medication Sig Start Date End Date Taking? Authorizing Provider  ALPRAZolam Duanne Moron) 0.25 MG tablet Take 0.25 mg by mouth daily as needed for anxiety.     [provider]  aspirin EC 81 MG tablet Take 1 tablet (81 mg total) by mouth daily. 05/16/18   Bhagat, Crista Luria, PA  atorvastatin (LIPITOR) 80 MG tablet Take 1 tablet (80 mg total) by mouth daily. 05/16/18   Bhagat, Crista Luria, PA  baclofen (LIORESAL) 10 MG tablet Take 10 mg by mouth 2 (two) times daily.     [provider]  carvedilol (COREG) 12.5 MG tablet Take 1 tablet (12.5 mg total) by mouth 2 (two) times daily with a meal. 03/06/18   Josue Hector, MD  chlorhexidine (PERIDEX) 0.12 % solution 15 mLs by Mouth Rinse route 2 (two) times daily as needed (sore gums).  05/05/18   [provider]  clopidogrel (PLAVIX) 75 MG tablet Take 1 tablet (75 mg total) by mouth daily. 05/16/18   Bhagat,  Crista Luria, PA  cyclobenzaprine (FLEXERIL) 10 MG tablet Take 1 tablet (10 mg total) by mouth 3 (three) times daily as needed for muscle spasms. Patient taking differently: Take 10 mg by mouth 2 (two) times daily.  10/21/14   Leonard Schwartz, MD  gabapentin (NEURONTIN) 300 MG capsule Take 300 mg by mouth at bedtime.     [provider]  hydrochlorothiazide (HYDRODIURIL) 25 MG tablet Take 1 tablet (25 mg total) by mouth daily. 03/06/18   Josue Hector, MD  lactose free nutrition (BOOST) LIQD Take 237 mLs by mouth daily at 2 PM.    [provider]  macitentan (OPSUMIT) 10 MG tablet Take 1 tablet (10 mg total) by mouth daily. Will need appointment with Dr. Aundra Dubin for further refills 05/16/18   Leanor Kail, PA  mirtazapine (REMERON) 30 MG tablet Take 30 mg by mouth at bedtime.    [provider]  pantoprazole ([place])  40 MG tablet Take 1 tablet (40 mg total) by mouth 2 (two) times daily. Patient taking differently: Take 40 mg by mouth daily.  01/02/17   Ghimire, Henreitta Leber, MD  potassium chloride SA (K-DUR,KLOR-CON) 20 MEQ tablet Take 1 tablet (20 mEq total) by mouth daily. 05/08/18   Larey Dresser, MD  sildenafil (REVATIO) 20 MG tablet Take 1 tablet (20 mg total) by mouth 3 (three) times daily. 03/06/18   Josue Hector, MD  traMADol-acetaminophen (ULTRACET) 37.5-325 MG tablet Take 2 tablets by mouth 4 (four) times daily as needed for pain. 05/07/18   [provider]    Allergies:  Allergies  Allergen Reactions  . Sulfa Antibiotics Swelling    Mouth and tongue swelling    Social History   Socioeconomic History  . Marital status: Widowed    Spouse name: Not on file  . Number of children: Not on file  . Years of education: Not on file  . Highest education level: Not on file  Occupational History  . Not on file  Social Needs  . Financial resource strain: Not on file  . Food insecurity:    Worry: Not on file    Inability: Not on file  .  Transportation needs:    Medical: Not on file    Non-medical: Not on file  Tobacco Use  . Smoking status: Former Smoker    Packs/day: 0.10    Years: 10.00    Pack years: 1.00    Types: Cigarettes    Last attempt to quit: 03/31/1991    Years since quitting: 27.1  . Smokeless tobacco: Never Used  Substance and Sexual Activity  . Alcohol use: No  . Drug use: No  . Sexual activity: Never  Lifestyle  . Physical activity:    Days per week: Not on file    Minutes per session: Not on file  . Stress: Not on file  Relationships  . Social connections:    Talks on phone: Not on file    Gets together: Not on file    Attends religious service: Not on file    Active member of club or organization: Not on file    Attends meetings of clubs or organizations: Not on file    Relationship status: Not on file  . Intimate partner violence:    Fear of current or ex partner: Not on file    Emotionally abused: Not on file    Physically abused: Not on file    Forced sexual activity: Not on file  Other Topics Concern  . Not on file  Social History Narrative  . Not on file     History reviewed. No pertinent family history.  Review of Systems: All other systems reviewed and are otherwise negative except as noted above.  Labs:   Lab Results  Component Value Date   WBC 9.5 05/18/2018   HGB 9.5 (L) 05/18/2018   HCT 33.1 (L) 05/18/2018   MCV 89.9 05/18/2018   PLT 197 05/18/2018    Recent Labs  Lab 05/18/18 1711  NA 136  K 4.8  CL 107  CO2 22  BUN 37*  CREATININE 1.57*  CALCIUM 9.1  PROT 7.0  BILITOT 0.7  ALKPHOS 50  ALT 11  AST 19  GLUCOSE 170*   No results for input(s): CKTOTAL, CKMB, TROPONINI in the last 72 hours. No results found for: CHOL, HDL, LDLCALC, TRIG No results found for: DDIMER  Radiology/Studies:  Dg Chest 2 View  Result Date: 05/18/2018 CLINICAL DATA:  Postoperative fatigue. EXAM: CHEST - 2 VIEW COMPARISON:  05/14/2018 FINDINGS: New streaky densities at  the left lung base. Linear density at the right lung base. Linear density in the mid peripheral left lung. Upper lungs remain clear. Heart size is within normal limits and stable. Atherosclerotic calcifications at the aortic arch. No large pleural effusions. IMPRESSION: Streaky lung densities most prominent at the left lung base. Left basilar disease could represent atelectasis and/or infection. Electronically Signed   By: Markus Daft M.D.   On: 05/18/2018 17:34   Dg Chest 2 View  Result Date: 05/14/2018 CLINICAL DATA:  Pulmonary hypertension. Heart catheterization today and will be getting stents tomorrow. History of diabetes. Former smoker. EXAM: CHEST - 2 VIEW COMPARISON:  03/10/2018 FINDINGS: Shallow inspiration. Cardiac enlargement with mild central vascular congestion. Bronchial thickening and central interstitial pattern suggesting chronic bronchitis. No airspace disease or consolidation in the lungs. No blunting of costophrenic angles. No pneumothorax. Mediastinal contours appear intact. Calcification and torsion of the aorta. Esophageal hiatal hernia behind the heart. Degenerative changes in the spine. IMPRESSION: Cardiac enlargement with mild central vascular congestion. Chronic bronchitic changes. No edema or consolidation. Electronically Signed   By: Lucienne Capers M.D.   On: 05/14/2018 21:06   [place] Extrem Low Right Ltd  Result Date: 05/19/2018 CLINICAL DATA:  Swelling and pain in the right groin, status post cardiac catheterization 05/14/2018 EXAM: ULTRASOUND right LOWER EXTREMITY LIMITED TECHNIQUE: Ultrasound examination of the lower extremity soft tissues was performed in the area of clinical concern. COMPARISON:  None. FINDINGS: Targeted ultrasound of the right groin performed in the region of concern. There is echogenic edema within the subcutaneous soft tissues. Approximate 1.5 x 1.2 cm pseudoaneurysm in the right groin. Slightly tortuous neck contiguous with the right common femoral  artery. IMPRESSION: Edema within the soft tissues of the right groin. Positive for small pseudoaneurysm arising from the right common femoral artery. Electronically Signed   By: Donavan Foil M.D.   On: 05/19/2018 01:18   Wt Readings from Last 3 Encounters:  05/16/18 64.5 kg  05/08/18 63.1 kg  03/06/18 65.3 kg    EKG: Sinus rhythm, frequent PVCs, inferior T wave inversions unchanged from prior area  Physical Exam: Blood pressure (!) 157/70, pulse 97, temperature 98.8 F (37.1 C), resp. rate (!) 21, SpO2 94 %. There is no height or weight on file to calculate BMI. General: Well developed, well nourished, in no acute distress. Head: Normocephalic, atraumatic, sclera non-icteric, no xanthomas, nares are without discharge.  Neck: Negative for carotid bruits. JVD not elevated. Lungs: Clear bilaterally to auscultation without wheezes, rales, or rhonchi. Breathing is unlabored. Heart: RRR with S1 S2. No murmurs, rubs, or gallops appreciated. Abdomen: Soft, non-tender, non-distended with normoactive bowel sounds. No hepatomegaly. No rebound/guarding. No obvious abdominal masses. Msk:  Strength and tone appear normal for age. Extremities: Significant ecchymosis in the right groin, tender to palpation over the site of the right femoral pulse.  2+ right femoral pulse.  Faint but palpable PT and DP pulses. Neuro: Alert and oriented X 3. No focal deficit. No facial asymmetry. Moves all extremities spontaneously. Psych:  Responds to questions appropriately with a normal affect.    Assessment and Plan  84 year old lady who presents with right groin pain secondary to femoral artery pseudoaneurysm and AKI after recent complex PCI.  1.  RFA pseudoaneurysm: Relatively small, however, the patient is having significant discomfort so probably more prudent to perform an ultrasound-guided thrombin injection  as opposed to serial ultrasound assessments.  We will plan to do this tomorrow.  2.  AKI: Suspect that  this represents some combination of hypovolemia/acute blood loss anemia and contrast-induced AKI.  Recent right heart cath showed normal filling pressures (CVP 7, wedge 6).  We will hold diuretics, and give a gentle 500 cc fluid bolus overnight.  Plan to recheck renal function in the a.m.  3.  CAD status post PCI: Continue home dual antiplatelet therapy with aspirin and Plavix, and high intensity atorvastatin.  4.  Pulmonary hypertension: Continue home macitentan and sildenafil.  Holding diuretics as after mentioned.  5.  Acute blood loss anemia: Approximately two-point hemoglobin drop, however this is stable from discharge.  No indication for transfusion at present.  Will trend CBC in the morning.  Signed, Doylene Canning, MD 05/19/2018, 2:31 AM

## 2018-05-19 NOTE — Significant Event (Addendum)
Rapid Response Event Note  Overview: Time Called: 9518 Arrival Time: 1717 Event Type: Neurologic, Hypotension  Initial Focused Assessment: Called to see patient d/t hypotension and lethargy. Upon arrival patient asleep. Opens eyes to name and is able to follow simple commands. Patient falls asleep between commands and is excessively somnolent. When aroused, BP returns to normal as charted, but when left alone, patient goes back to sleep and BP trends down. Labs reviewed, pcxr reviewed. SpO2 shows low oxygen on monitor. Patient unable to close her mouth to command and keep it closed to take deep breaths through her nose, continues to fall asleep. Would like to get ABG to see if hypercarbnia present. Will try narcan to see if somnolence r/t pain meds. Did not see change in LOC after receiving low dose narcan.   Interventions: Narcan 0.2 mg IVP given.  ABG done, see results  Venti Mask d/t patient is mouth breather.  Baclofen d/c'd as patient is also receiving flexeril and family at bedside states patient has not taken baclofen in a long time.  Plan of Care (if not transferred): Continue to monitor  Call prn  Event Summary: Name of Physician Notified: Cardiology on call PA, Ellen Henri at 1740    at    Outcome: Stayed in room and stabalized     Myra Gianotti

## 2018-05-19 NOTE — Consult Note (Addendum)
Hospital Consult  RIGHT FEMORAL ARTERY PSEUDOANEURYSM STATUS POST CARDIAC CATHETERIZATION: This patient underwent cardiac catheterization via a right femoral approach on 05/15/2018.  She is found to have a pseudoaneurysm in the right groin on a duplex done by radiology yesterday.  We were consulted for further recommendations.  She tells me that she is not having significant pain currently.  She denies paresthesias or leg swelling.  On exam she has a pulsatile mass in the right groin. She has excellent Doppler signals in the right foot.  The patient is 84 years old and somewhat debilitated.  I had a long discussion with the daughter about our potential options.  I explained that sometimes these will clot off on their own and certainly this would be ideal given her age.  I have ordered a follow-up duplex scan for tomorrow.  The other option would be surgical intervention which I think we should try to avoid given her age, debilitated state, and risk for wound healing problems.  More likely she will require either duplex directed manual occlusion of the pseudoaneurysm or thrombin injection.  Duplex scan tomorrow will help determine our options.  I will follow.  Joy Mayo, MD, [place] [phone] Office: [phone]   Reason for Consult:  PSA R groin Requesting Physician:  Dr, [place] MRN #:  [id number]  History of Present Illness: This is a 84 y.o. female with PMH significant for CAD with recent PCI and stenting 05/15/18.  She presented back to ED yesterday with ecchymosis, local edema, and pain to R groin cath site.  Ultrasound demonstrated a pseudoaneurysm from R CFA measuring 1.5 x 1.2cm.  When seen today she states R groin feels much better.  She denies rest pain R foot.  She denies chest pain.  She is taking aspirin, plavix, and statin daily.  She is ambulatory with a walker.  Past Medical History:  Diagnosis Date  . Anxiety   . Arthritis    osteo; "mostly hands, knees, probably  back too" (12/09/2016)  . Chronic lower back pain   . Complication of anesthesia 2011   resp distress -on vent after surgery  . Coronary artery disease   . Depression   . Diabetes ([place])   . Esophageal stricture   . GERD (gastroesophageal reflux disease)   . Headache    out grew them  . High cholesterol   . History of blood transfusion 2011; ?03/2012   "related to ORs" (12/09/2016)  . Hypertension   . Intestinal obstruction ([place])   . Macular degeneration of left eye    dx over 55 yrs ago.....hasn't changed much  . Shingles   . Squamous carcinoma 2013   squamas cell on scalp--took 14 radiation tx--2013  . Stroke [facility]) 1967   Mini stroke;  No lasting deficits  . Type II diabetes mellitus ([place]) dx'd 11/2016    Past Surgical History:  Procedure Laterality Date  . ABDOMINAL HYSTERECTOMY    . APPENDECTOMY    . BACK SURGERY    . CATARACT EXTRACTION, BILATERAL Bilateral   . CHOLECYSTECTOMY    . COLON SURGERY    . COLONOSCOPY    . CORONARY ATHERECTOMY N/A 05/15/2018   Procedure: CORONARY ATHERECTOMY;  Surgeon: Leonie Man, MD;  Location: [place] [facility];  Service: Cardiovascular;  Laterality: N/A;  . CORONARY STENT INTERVENTION N/A 05/15/2018   Procedure: CORONARY STENT INTERVENTION;  Surgeon: Leonie Man, MD;  Location: [place] [facility];  Service: Cardiovascular;  Laterality: N/A;  . ESOPHAGOGASTRODUODENOSCOPY (  EGD) WITH ESOPHAGEAL DILATION  "several times"  . ESOPHAGOGASTRODUODENOSCOPY (EGD) WITH PROPOFOL N/A 12/31/2016   Procedure: ESOPHAGOGASTRODUODENOSCOPY (EGD) WITH PROPOFOL;  Surgeon: Wilford Corner, MD;  Location: [place];  Service: Endoscopy;  Laterality: N/A;  . EYE SURGERY    . FRACTURE SURGERY    . IR FLUORO GUIDE CV LINE RIGHT  01/02/2017  . IR US GUIDE VASC ACCESS RIGHT  01/02/2017  . JOINT REPLACEMENT    . LUMBAR DISC SURGERY  10/25/2014   Right L4-L5 removal of seven free fragments of disk mostly posterolaterally.  Lysis of adhesion.   Microscope/notes 10/26/2014  . LUMBAR LAMINECTOMY/DECOMPRESSION MICRODISCECTOMY Right 08/09/2014   Procedure: Right Lumbar Four to Five Microdiskectomy;  Surgeon: Floyce Stakes, MD;  Location: [facility];  Service: Neurosurgery;  Laterality: Right;  Right L4-5 Microdiskectomy  . ORIF PERIPROSTHETIC FRACTURE  03/31/2012   Procedure: OPEN REDUCTION INTERNAL FIXATION (ORIF) PERIPROSTHETIC FRACTURE;  Surgeon: Mauri Pole, MD;  Location: [facility];  Service: Orthopedics;  Laterality: Left;  Open reduction internal fixation Left distal femur periprosthetic fracture  . RIGHT/LEFT HEART CATH AND CORONARY ANGIOGRAPHY N/A 05/14/2018   Procedure: RIGHT/LEFT HEART CATH AND CORONARY ANGIOGRAPHY;  Surgeon: Larey Dresser, MD;  Location: [place] [facility];  Service: Cardiovascular;  Laterality: N/A;  . SMALL INTESTINE SURGERY  2011   "really bad blockage; went into respiratory distress and was on ventilator after surgery; [facility]"  . SQUAMOUS CELL CARCINOMA EXCISION  ~ 2012   S/P "cut off her head then 15 radiation txs"   . TOTAL KNEE ARTHROPLASTY Bilateral    bilateral    Allergies  Allergen Reactions  . Sulfa Antibiotics Swelling    Mouth and tongue swelling    Prior to Admission medications   Medication Sig Start Date End Date Taking? Authorizing Provider  ALPRAZolam Duanne Moron) 0.25 MG tablet Take 0.25 mg by mouth daily as needed for anxiety.     [provider]  aspirin EC 81 MG tablet Take 1 tablet (81 mg total) by mouth daily. 05/16/18   Bhagat, Crista Luria, PA  atorvastatin (LIPITOR) 80 MG tablet Take 1 tablet (80 mg total) by mouth daily. 05/16/18   Bhagat, Crista Luria, PA  baclofen (LIORESAL) 10 MG tablet Take 10 mg by mouth 2 (two) times daily.     [provider]  carvedilol (COREG) 12.5 MG tablet Take 1 tablet (12.5 mg total) by mouth 2 (two) times daily with a meal. 03/06/18   Josue Hector, MD  chlorhexidine (PERIDEX) 0.12 % solution 15 mLs by Mouth Rinse route  2 (two) times daily as needed (sore gums).  05/05/18   [provider]  clopidogrel (PLAVIX) 75 MG tablet Take 1 tablet (75 mg total) by mouth daily. 05/16/18   Bhagat, Crista Luria, PA  cyclobenzaprine (FLEXERIL) 10 MG tablet Take 1 tablet (10 mg total) by mouth 3 (three) times daily as needed for muscle spasms. Patient taking differently: Take 10 mg by mouth 2 (two) times daily.  10/21/14   Leonard Schwartz, MD  gabapentin (NEURONTIN) 300 MG capsule Take 300 mg by mouth at bedtime.     [provider]  hydrochlorothiazide (HYDRODIURIL) 25 MG tablet Take 1 tablet (25 mg total) by mouth daily. 03/06/18   Josue Hector, MD  lactose free nutrition (BOOST) LIQD Take 237 mLs by mouth daily at 2 PM.    [provider]  macitentan (OPSUMIT) 10 MG tablet Take 1 tablet (10 mg total) by mouth daily. Will need appointment with  Dr. Aundra Dubin for further refills 05/16/18   Leanor Kail, PA  mirtazapine (REMERON) 30 MG tablet Take 30 mg by mouth at bedtime.    [provider]  pantoprazole (PROTONIX) 40 MG tablet Take 1 tablet (40 mg total) by mouth 2 (two) times daily. Patient taking differently: Take 40 mg by mouth daily.  01/02/17   Ghimire, Henreitta Leber, MD  potassium chloride SA (K-DUR,KLOR-CON) 20 MEQ tablet Take 1 tablet (20 mEq total) by mouth daily. 05/08/18   Larey Dresser, MD  sildenafil (REVATIO) 20 MG tablet Take 1 tablet (20 mg total) by mouth 3 (three) times daily. 03/06/18   Josue Hector, MD  traMADol-acetaminophen (ULTRACET) 37.5-325 MG tablet Take 2 tablets by mouth 4 (four) times daily as needed for pain. 05/07/18   [provider]    Social History   Socioeconomic History  . Marital status: Widowed    Spouse name: Not on file  . Number of children: Not on file  . Years of education: Not on file  . Highest education level: Not on file  Occupational History  . Not on file  Social Needs  . Financial resource strain: Not on file  . Food  insecurity:    Worry: Not on file    Inability: Not on file  . Transportation needs:    Medical: Not on file    Non-medical: Not on file  Tobacco Use  . Smoking status: Former Smoker    Packs/day: 0.10    Years: 10.00    Pack years: 1.00    Types: Cigarettes    Last attempt to quit: 03/31/1991    Years since quitting: 27.1  . Smokeless tobacco: Never Used  Substance and Sexual Activity  . Alcohol use: No  . Drug use: No  . Sexual activity: Never  Lifestyle  . Physical activity:    Days per week: Not on file    Minutes per session: Not on file  . Stress: Not on file  Relationships  . Social connections:    Talks on phone: Not on file    Gets together: Not on file    Attends religious service: Not on file    Active member of club or organization: Not on file    Attends meetings of clubs or organizations: Not on file    Relationship status: Not on file  . Intimate partner violence:    Fear of current or ex partner: Not on file    Emotionally abused: Not on file    Physically abused: Not on file    Forced sexual activity: Not on file  Other Topics Concern  . Not on file  Social History Narrative  . Not on file     History reviewed. No pertinent family history.  ROS: Otherwise negative unless mentioned in HPI  Physical Examination  Vitals:   05/19/18 0800 05/19/18 1033  BP: (!) 98/45 (!) 105/53  Pulse: 90   Resp: 18   Temp:    SpO2:     Body mass index is 26.81 kg/m.  General:  WDWN in NAD Gait: Not observed HENT: WNL, normocephalic Pulmonary: normal non-labored breathing, without Rales, rhonchi,  wheezing Cardiac: irregular Abdomen:  soft, NT/ND, no masses Skin: without rashes Vascular Exam/Pulses: multiphasic ATA signals BLE Extremities: without ischemic changes, without Gangrene , without cellulitis; without open wounds; R groin with local ecchymosis; tenderness to deep palpation Musculoskeletal: no muscle wasting or atrophy  Neurologic: A&O X 3;   No focal weakness  or paresthesias are detected; speech is fluent/normal Psychiatric:  The pt has Normal affect. Lymph:  Unremarkable  CBC    Component Value Date/Time   WBC 10.2 05/19/2018 0632   RBC 3.66 (L) 05/19/2018 0632   HGB 9.4 (L) 05/19/2018 0632   HCT 32.1 (L) 05/19/2018 0632   PLT 192 05/19/2018 0632   MCV 87.7 05/19/2018 0632   MCH 25.7 (L) 05/19/2018 0632   MCHC 29.3 (L) 05/19/2018 0632   RDW 18.5 (H) 05/19/2018 0632   LYMPHSABS 1.5 05/18/2018 1711   MONOABS 0.9 05/18/2018 1711   EOSABS 0.3 05/18/2018 1711   BASOSABS 0.0 05/18/2018 1711    BMET    Component Value Date/Time   NA 138 05/19/2018 0632   K 4.1 05/19/2018 0632   CL 106 05/19/2018 0632   CO2 20 (L) 05/19/2018 0632   GLUCOSE 181 (H) 05/19/2018 0632   BUN 33 (H) 05/19/2018 0632   CREATININE 1.37 (H) 05/19/2018 0632   CALCIUM 9.1 05/19/2018 0632   GFRNONAA 34 (L) 05/19/2018 0632   GFRAA 40 (L) 05/19/2018 0632    COAGS: Lab Results  Component Value Date   INR 1.18 12/09/2016   INR 2.4 (H) 12/21/2008   INR 2.8 (H) 12/20/2008     Non-Invasive Vascular Imaging:   1.5 x 1.2cm PSA R groin  Statin:  Yes.   Beta Blocker:  Yes.   Aspirin:  Yes.   ACEI:  No. ARB:  No. CCB use:  No Other antiplatelets/anticoagulants:  Plavix   ASSESSMENT/PLAN: This is a 84 y.o. female R CFA pseudoaneurysm s/p PCI with stenting  1.5 x 1.2cm pseudoaneurysm R CFA Pain and soreness improving R groin Cardiology recommending thrombin injection Dr. Scot Dock will review ultrasound imaging and provide further recommendations  Dagoberto Ligas PA-C [facility] [phone]

## 2018-05-19 NOTE — Progress Notes (Signed)
  Paged to see for increased O2 demand and soft BPs.   NAD on exam. CXR 05/18/18 with new "streaky" findings concerning for edema vs infection. Received several hours of IVF for AKI  Will give 40 mg IV lasix now and repeat tonight.  Decrease coreg to 6.25 mg BID with soft pressures. Repeat CXR.   Discussed all above with Dr. Aundra Dubin.     Legrand Como [id number] [place], PA-C 05/19/2018 1:37 PM

## 2018-05-20 ENCOUNTER — Observation Stay (HOSPITAL_BASED_OUTPATIENT_CLINIC_OR_DEPARTMENT_OTHER): Payer: Medicare Other

## 2018-05-20 DIAGNOSIS — I5033 Acute on chronic diastolic (congestive) heart failure: Secondary | ICD-10-CM

## 2018-05-20 DIAGNOSIS — T81718A Complication of other artery following a procedure, not elsewhere classified, initial encounter: Secondary | ICD-10-CM | POA: Diagnosis not present

## 2018-05-20 DIAGNOSIS — I724 Aneurysm of artery of lower extremity: Secondary | ICD-10-CM

## 2018-05-20 DIAGNOSIS — I272 Pulmonary hypertension, unspecified: Secondary | ICD-10-CM | POA: Diagnosis not present

## 2018-05-20 LAB — MAGNESIUM: MAGNESIUM: 1.4 mg/dL — AB (ref 1.7–2.4)

## 2018-05-20 LAB — BASIC METABOLIC PANEL
Anion gap: 12 (ref 5–15)
BUN: 39 mg/dL — AB (ref 8–23)
CALCIUM: 8.9 mg/dL (ref 8.9–10.3)
CO2: 19 mmol/L — ABNORMAL LOW (ref 22–32)
CREATININE: 1.5 mg/dL — AB (ref 0.44–1.00)
Chloride: 107 mmol/L (ref 98–111)
GFR calc Af Amer: 36 mL/min — ABNORMAL LOW (ref >60)
GFR, EST NON AFRICAN AMERICAN: 31 mL/min — AB (ref >60)
GLUCOSE: 147 mg/dL — AB (ref 70–99)
Potassium: 3.2 mmol/L — ABNORMAL LOW (ref 3.5–5.1)
SODIUM: 138 mmol/L (ref 135–145)

## 2018-05-20 LAB — CBC
HCT: 31.8 % — ABNORMAL LOW (ref 36.0–46.0)
Hemoglobin: 9.4 g/dL — ABNORMAL LOW (ref 12.0–15.0)
MCH: 25 pg — AB (ref 26.0–34.0)
MCHC: 29.6 g/dL — AB (ref 30.0–36.0)
MCV: 84.6 fL (ref 80.0–100.0)
PLATELETS: 212 10*3/uL (ref 150–400)
RBC: 3.76 MIL/uL — ABNORMAL LOW (ref 3.87–5.11)
RDW: 18.8 % — ABNORMAL HIGH (ref 11.5–15.5)
WBC: 8.1 10*3/uL (ref 4.0–10.5)
nRBC: 0 % (ref 0.0–0.2)

## 2018-05-20 MED ORDER — MORPHINE SULFATE (PF) 2 MG/ML IV SOLN
0.2500 mg | Freq: Once | INTRAVENOUS | Status: AC
  Administered 2018-05-20 (×2): 0.25 mg via INTRAVENOUS

## 2018-05-20 MED ORDER — MORPHINE SULFATE (PF) 2 MG/ML IV SOLN
0.5000 mg | Freq: Once | INTRAVENOUS | Status: AC
  Administered 2018-05-20: 0.5 mg via INTRAVENOUS

## 2018-05-20 MED ORDER — POTASSIUM CHLORIDE CRYS ER 20 MEQ PO TBCR
40.0000 meq | EXTENDED_RELEASE_TABLET | Freq: Once | ORAL | Status: AC
  Administered 2018-05-20: 40 meq via ORAL
  Filled 2018-05-20: qty 2

## 2018-05-20 MED ORDER — MAGNESIUM SULFATE 4 GM/100ML IV SOLN
4.0000 g | Freq: Once | INTRAVENOUS | Status: AC
  Administered 2018-05-20: 4 g via INTRAVENOUS
  Filled 2018-05-20: qty 100

## 2018-05-20 MED ORDER — FUROSEMIDE 10 MG/ML IJ SOLN
40.0000 mg | Freq: Once | INTRAMUSCULAR | Status: AC
  Administered 2018-05-20: 40 mg via INTRAVENOUS
  Filled 2018-05-20: qty 4

## 2018-05-20 MED ORDER — MORPHINE SULFATE (PF) 2 MG/ML IV SOLN
INTRAVENOUS | Status: AC
  Filled 2018-05-20: qty 1

## 2018-05-20 NOTE — Progress Notes (Signed)
   VASCULAR SURGERY ASSESSMENT & PLAN:   RIGHT FEMORAL ARTERY PSEUDOANEURYSM: This patient developed a right femoral artery pseudoaneurysm.  She is now 5 days out from her heart cath.  I have her scheduled for a follow-up duplex scan today to help determine if she might be a candidate for duplex directed manual occlusion of the pseudoaneurysm versus thrombin injection.  I think she would be at high risk for surgery given her debilitated state and age.  The fourth option would be to simply follow this given that it is currently asymptomatic.  SUBJECTIVE:   Denies any groin pain or abdominal pain this morning.  PHYSICAL EXAM:   Vitals:   05/20/18 0500 05/20/18 0530 05/20/18 0600 05/20/18 0630  BP: (!) 104/54 (!) 113/55 (!) 111/56 (!) 110/55  Pulse: 82 (!) 46 83 84  Resp: _0 Temp:      TempSrc:      SpO2: 93% 93% 95% 95%  Weight:      Height:       Pulsatile mass in the right groin.  LABS:   Lab Results  Component Value Date   WBC 9.8 05/19/2018   HGB 8.8 (L) 05/19/2018   HCT 30.3 (L) 05/19/2018   MCV 87.1 05/19/2018   PLT 187 05/19/2018   Lab Results  Component Value Date   CREATININE 1.50 (H) 05/20/2018   Lab Results  Component Value Date   INR 1.18 12/09/2016   CBG (last 3)  Recent Labs    05/19/18 1320  GLUCAP 153*   PROBLEM LIST:    Active Problems:   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 (HCC)   CURRENT MEDS:   . aspirin EC  81 mg Oral Daily  . atorvastatin  80 mg Oral Daily  . carvedilol  3.125 mg Oral BID WC  . clopidogrel  75 mg Oral Daily  . cyclobenzaprine  10 mg Oral BID  . gabapentin  300 mg Oral QHS  . macitentan  10 mg Oral Daily  . mouth rinse  15 mL Mouth Rinse BID  . mirtazapine  30 mg Oral QHS  . pantoprazole  40 mg Oral Daily  . sildenafil  20 mg Oral TID   [place]: [phone] Office: [phone] 05/20/2018

## 2018-05-20 NOTE — Progress Notes (Addendum)
Pseudoaneurysm compression completed:   After 12 minutes total of compression, pseudoaneurysm is successfully closed. 10:30 am. Patient is to remain on bedrest for 6 hours and recheck ultrasound will be done at 4:30 pm.   Addendum 4:52 PM recheck of right groin, pseudo is still closed. Hematoma noted. Patient can come off bedrest.  Landry Mellow, RDMS, RVT

## 2018-05-20 NOTE — Progress Notes (Addendum)
Patient ID: [name], female   DOB: 1932/06/25, 84 y.o.   MRN: [id number]     Advanced Heart Failure Rounding Note  PCP-Cardiologist: No primary care provider on file.   Subjective:    Pt discharged 05/16/18 after a complex PCI requiring atherectomy of an LAD diagonal bifurcation lesion on 10/11.  She was discharged on 10/12; her hemoglobin at that time was 9.9 (drop of 2 g overnight) and hematoma noted   Presented to [facility] 05/18/18 with groin pain. R groin [place] 05/18/18 with 1.5 x 1.2 cm pseudoaneurysm.  Seen by vascular surgery, plan to repeat duplex today to decide on compression or thrombin injection versus conservative management.  Hgb is stable.   Feels ok today, denies dyspnea.  She was given IV Lasix yesterday with volume overload/lower oxygen saturation.  CXR with streaky left base atelectasis versus infiltrate.   Objective:   Weight Range: 66.5 kg Body mass index is 26.81 kg/m.   Vital Signs:   Temp:  [97.3 F (36.3 C)-98.8 F (37.1 C)] 98.4 F (36.9 C) (10/16 0700) Pulse Rate:  [25-86] 81 (10/16 0700) Resp:  [12-19] 17 (10/16 0700) BP: (80-119)/(37-66) 112/55 (10/16 0700) SpO2:  [86 %-100 %] 97 % (10/16 0840) FiO2 (%):  [35 %] 35 % (10/16 0400) Last BM Date: 05/19/18  Weight change: Filed Weights   05/19/18 0351  Weight: 66.5 kg    Intake/Output:   Intake/Output Summary (Last 24 hours) at 05/20/2018 0909 Last data filed at 05/19/2018 1100 Gross per 24 hour  Intake 120 ml  Output -  Net 120 ml      Physical Exam    General: NAD Neck: JVP 9-10 cm, no thyromegaly or thyroid nodule.  Lungs: Clear to auscultation bilaterally with normal respiratory effort. CV: Nondisplaced PMI.  Heart regular S1/S2, no S3/S4, no murmur.  1+ ankle edema.   Abdomen: Soft, nontender, no hepatosplenomegaly, no distention.  Skin: Intact without lesions or rashes.  Neurologic: Alert and oriented x 3.  Psych: Normal affect. Extremities: No clubbing or cyanosis.  HEENT:  Normal.   Telemetry   NSR 70s, personally reviewed.   Labs    CBC Recent Labs    05/18/18 1711  05/19/18 1544 05/20/18 0718  WBC 9.5   < > 9.8 8.1  NEUTROABS 6.8  --   --   --   HGB 9.5*   < > 8.8* 9.4*  HCT 33.1*   < > 30.3* 31.8*  MCV 89.9   < > 87.1 84.6  PLT 197   < > 187 212   < > = values in this interval not displayed.   Basic Metabolic Panel Recent Labs    05/19/18 0632 05/20/18 0242 05/20/18 0718  NA 138 138  --   K 4.1 3.2*  --   CL 106 107  --   CO2 20* 19*  --   GLUCOSE 181* 147*  --   BUN 33* 39*  --   CREATININE 1.37* 1.50*  --   CALCIUM 9.1 8.9  --   MG  --   --  1.4*   Liver Function Tests Recent Labs    05/18/18 1711  AST 19  ALT 11  ALKPHOS 50  BILITOT 0.7  PROT 7.0  ALBUMIN 3.1*   No results for input(s): LIPASE, AMYLASE in the last 72 hours. Cardiac Enzymes No results for input(s): CKTOTAL, CKMB, CKMBINDEX, TROPONINI in the last 72 hours.  BNP: BNP (last 3 results) Recent Labs  05/08/18 1201  BNP 1,105.0*    ProBNP (last 3 results) No results for input(s): PROBNP in the last 8760 hours.   D-Dimer No results for input(s): DDIMER in the last 72 hours. Hemoglobin A1C No results for input(s): HGBA1C in the last 72 hours. Fasting Lipid Panel No results for input(s): CHOL, HDL, LDLCALC, TRIG, CHOLHDL, LDLDIRECT in the last 72 hours. Thyroid Function Tests No results for input(s): TSH, T4TOTAL, T3FREE, THYROIDAB in the last 72 hours.  Invalid input(s): FREET3  Other results:   Imaging    Dg Chest Port 1 View  Result Date: 05/19/2018 CLINICAL DATA:  CHF. EXAM: PORTABLE CHEST 1 VIEW COMPARISON:  05/18/2018 FINDINGS: The patient is mildly rotated to the right. The cardiac silhouette remains enlarged. Aortic atherosclerosis is noted. There is mild chronic interstitial coarsening/chronic bronchitic change. Mild streaky opacity persists in the left lung base. Linear opacities in the right lung base and left midlung on the  prior study are no longer apparent. No overt pulmonary edema, sizable pleural effusion, or pneumothorax is identified. No acute osseous abnormality is seen. IMPRESSION: Persistent streaky left basilar atelectasis or infiltrate. Electronically Signed   By: Logan Bores M.D.   On: 05/19/2018 16:05     Medications:     Scheduled Medications: . aspirin EC  81 mg Oral Daily  . atorvastatin  80 mg Oral Daily  . carvedilol  3.125 mg Oral BID WC  . clopidogrel  75 mg Oral Daily  . cyclobenzaprine  10 mg Oral BID  . furosemide  40 mg Intravenous Once  . gabapentin  300 mg Oral QHS  . macitentan  10 mg Oral Daily  . mouth rinse  15 mL Mouth Rinse BID  . mirtazapine  30 mg Oral QHS  . pantoprazole  40 mg Oral Daily  . potassium chloride  40 mEq Oral Once  . sildenafil  20 mg Oral TID    Infusions: . magnesium sulfate 1 - 4 g bolus IVPB      PRN Medications: ALPRAZolam, naloxone, ondansetron (ZOFRAN) IV, traMADol-acetaminophen    Patient Profile   [name] is a 84 y.o. female with PMH of DM, HTN, osteomyelitis, pulmonary HTN, RV failure, and PNA in 10/2017.  She was admitted after [facility] 10/10. Discharged on 10/12 19 after complex PCI  Returned to [facility] 05/18/18 with R groin pain.   Assessment/Plan   1. R groin pseudoaneurysm: R groin [place] 05/18/18 with 1.5 x 1.2 cm pseudoaneurysm from procedure 05/15/18.  Hgb stable and discomfort significantly improved.  - Vascular following, to repeat duplex today.  2. CAD: Coronary angiography showed complex bifurcation lesion with 95% stenosis proximal LAD with calcification at take-off of large D2. The ostial/proximal D2 also had 95% calcified stenosis. I suspect this lesion played a role in her exertional dyspnea and also in her mildly decreased LV systolic function (EF 87-56% on echo). S/p successful atherectomy with bifurcation stenting of proximal LAD and D2 last week. No chest pain.  - Continue ASA 81, statin, and Plavix.  3.Pulmonary  hypertension: She appearedto have at least moderate pulmonary hypertension by echo with RV failure. CT chest showed mild emphysema, which should not explain her degree of RV failure. V/Q scan did not show evidence for chronic PEs.Anti-centromere antibody, ANA, and RF were all positive. ?CREST variant of scleroderma.She was empirically started on sildenafil 20 mg tidby Dr. Francella Solian much effect so far. She remains quite symptomatic and is on home oxygen.[place] 05/15/18 showed moderate PAH but very high  PVR and low cardiac output. This is concerning for advanced pulmonary hypertension. She is on home oxygen.  - Continue sildenafil 20 mg TID  - Recently ordered Opsumit 10 mg daily. Has been approved, but she has not yet received her home supply.  - Eventually plan selexipag  - PFTs ordered. - She will need eventual sleep study.  4. Acute on chronic diastolic CHF/RV failure: LV and RV filling pressures were well-compensated on recent RHC.  However, yesterday thought to be volume overloaded and given IV Lasix.  Today, still with mild volume overload on exam.  Creatinine 1.5.  - Will give 1 more dose of Lasix 40 mg IV this morning.  - Replace K and Mg.   5. Diabetes:  - SSI while in house.   Need to mobilize, out of bed and will involve PT/OT.   Length of Stay: 0  Loralie Champagne, MD  05/20/2018, 9:09 AM

## 2018-05-20 NOTE — Progress Notes (Signed)
Right groin limited arterial duplex prelim:  Pseudoaneurysm noted with 2 separate compartments. The first, that is connected to pseudo neck, measures 1.64 cm. The second, more superficial area, measures 1.65 cm. The pseudo neck measures 0.39 cm wide and 0.67 cm long.  Called results to Dr. Scot Dock. He will see about setting up compression today. MD or PA will be present for ultrasound guided compression.   Landry Mellow, [place], RVT Vascular lab [phone]

## 2018-05-20 NOTE — Care Management Obs Status (Signed)
[place] NOTIFICATION   Patient Details  Name: [name] MRN: [id number] Date of Birth: 09-21-1931   Medicare Observation Status Notification Given:  Yes(Pt deferred to daughter to sign form)    Maryclare Labrador, RN 05/20/2018, 1:39 PM

## 2018-05-21 ENCOUNTER — Observation Stay (HOSPITAL_COMMUNITY): Payer: Medicare Other

## 2018-05-21 ENCOUNTER — Telehealth: Payer: Self-pay | Admitting: Physician Assistant

## 2018-05-21 DIAGNOSIS — I272 Pulmonary hypertension, unspecified: Secondary | ICD-10-CM

## 2018-05-21 DIAGNOSIS — I724 Aneurysm of artery of lower extremity: Secondary | ICD-10-CM | POA: Diagnosis not present

## 2018-05-21 DIAGNOSIS — T81718A Complication of other artery following a procedure, not elsewhere classified, initial encounter: Secondary | ICD-10-CM | POA: Diagnosis not present

## 2018-05-21 LAB — PULMONARY FUNCTION TEST
DL/VA % PRED: 73 %
DL/VA: 3.34 ml/min/mmHg/L
DLCO COR: 9.06 ml/min/mmHg
DLCO cor % pred: 42 %
DLCO unc % pred: 34 %
DLCO unc: 7.5 ml/min/mmHg
FEF 25-75 PRE: 0.71 L/s
FEF 25-75 Post: 1.12 L/sec
FEF2575-%CHANGE-POST: 58 %
FEF2575-%Pred-Post: 103 %
FEF2575-%Pred-Pre: 65 %
FEV1-%CHANGE-POST: 9 %
FEV1-%PRED-PRE: 69 %
FEV1-%Pred-Post: 76 %
FEV1-POST: 1.23 L
FEV1-Pre: 1.12 L
FEV1FVC-%CHANGE-POST: -1 %
FEV1FVC-%Pred-Pre: 102 %
FEV6-%CHANGE-POST: 11 %
FEV6-%Pred-Post: 81 %
FEV6-%Pred-Pre: 72 %
FEV6-POST: 1.67 L
FEV6-Pre: 1.5 L
FEV6FVC-%Change-Post: 0 %
FEV6FVC-%Pred-Post: 106 %
FEV6FVC-%Pred-Pre: 106 %
FVC-%Change-Post: 11 %
FVC-%Pred-Post: 76 %
FVC-%Pred-Pre: 68 %
FVC-POST: 1.67 L
FVC-PRE: 1.5 L
POST FEV6/FVC RATIO: 100 %
Post FEV1/FVC ratio: 74 %
Pre FEV1/FVC ratio: 75 %
Pre FEV6/FVC Ratio: 100 %

## 2018-05-21 LAB — BASIC METABOLIC PANEL
Anion gap: 10 (ref 5–15)
BUN: 41 mg/dL — AB (ref 8–23)
CHLORIDE: 106 mmol/L (ref 98–111)
CO2: 22 mmol/L (ref 22–32)
CREATININE: 1.54 mg/dL — AB (ref 0.44–1.00)
Calcium: 8.9 mg/dL (ref 8.9–10.3)
GFR calc Af Amer: 35 mL/min — ABNORMAL LOW (ref >60)
GFR calc non Af Amer: 30 mL/min — ABNORMAL LOW (ref >60)
GLUCOSE: 161 mg/dL — AB (ref 70–99)
POTASSIUM: 3.2 mmol/L — AB (ref 3.5–5.1)
Sodium: 138 mmol/L (ref 135–145)

## 2018-05-21 LAB — CBC
HEMATOCRIT: 30.2 % — AB (ref 36.0–46.0)
HEMOGLOBIN: 8.9 g/dL — AB (ref 12.0–15.0)
MCH: 25.2 pg — AB (ref 26.0–34.0)
MCHC: 29.5 g/dL — AB (ref 30.0–36.0)
MCV: 85.6 fL (ref 80.0–100.0)
PLATELETS: 279 10*3/uL (ref 150–400)
RBC: 3.53 MIL/uL — ABNORMAL LOW (ref 3.87–5.11)
RDW: 19 % — AB (ref 11.5–15.5)
WBC: 7.8 10*3/uL (ref 4.0–10.5)
nRBC: 0 % (ref 0.0–0.2)

## 2018-05-21 LAB — MAGNESIUM: MAGNESIUM: 2.5 mg/dL — AB (ref 1.7–2.4)

## 2018-05-21 MED ORDER — POTASSIUM CHLORIDE CRYS ER 20 MEQ PO TBCR: 40.0000 meq | EXTENDED_RELEASE_TABLET | Freq: Every day | ORAL | Status: DC

## 2018-05-21 MED ORDER — CARVEDILOL 3.125 MG PO TABS: 3.1250 mg | ORAL_TABLET | Freq: Two times a day (BID) | ORAL | 6 refills | Status: DC

## 2018-05-21 MED ORDER — POTASSIUM CHLORIDE CRYS ER 20 MEQ PO TBCR
40.0000 meq | EXTENDED_RELEASE_TABLET | Freq: Once | ORAL | Status: AC
  Administered 2018-05-21: 40 meq via ORAL
  Filled 2018-05-21: qty 2

## 2018-05-21 MED ORDER — FUROSEMIDE 20 MG PO TABS: 20.0000 mg | ORAL_TABLET | Freq: Every day | ORAL | 6 refills | Status: DC

## 2018-05-21 MED ORDER — ALBUTEROL SULFATE (2.5 MG/3ML) 0.083% IN NEBU
2.5000 mg | INHALATION_SOLUTION | Freq: Once | RESPIRATORY_TRACT | Status: AC
  Administered 2018-05-21: 2.5 mg via RESPIRATORY_TRACT

## 2018-05-21 MED ORDER — FUROSEMIDE 20 MG PO TABS
20.0000 mg | ORAL_TABLET | Freq: Every day | ORAL | Status: DC
  Administered 2018-05-21: 20 mg via ORAL
  Filled 2018-05-21: qty 1

## 2018-05-21 NOTE — Discharge Summary (Signed)
Advanced Heart Failure Discharge Note  Discharge Summary   Patient ID: [name] MRN: [id number], DOB/AGE: Apr 29, 1932 84 y.o. Admit date: 05/18/2018 D/C date:     05/21/2018   Primary Discharge Diagnoses:  1. R groin pseudoaneurysm 2. CAD 3.Pulmonary hypertension 4. Acute on chronic diastolic CHF/RV failure 5. Diabetes  Hospital Course:   [name] is a 84 y.o. female with PMH of DM, HTN, osteomyelitis, pulmonary HTN, RV failure, and PNA in 10/2017.  Pt discharged 05/16/18 after a complex PCI requiring atherectomy of an LAD diagonal bifurcation lesion on 10/11. She was discharged on 10/12;her hemoglobin at that time was 9.9 (drop of 2 g overnight) and hematoma noted.   Presented to [facility] 05/18/18 with groin pain. R groin [place] 05/18/18 with 1.5 x 1.2 cm pseudoaneurysm.  She had US-guided manual compression with closure of femoral artery PSA. Vascular consulted.   Problem based hospital course as below  1. R groin pseudoaneurysm:  - R groin [place] 05/18/18 with 1.5 x 1.2 cm pseudoaneurysm from procedure 05/15/18.  - Hgb stable and discomfort significantly improved.  - Pt underwent US-guided manual compression with closure of PSA by Dr. Scot Dock 05/20/18 2. CAD: Coronary angiography showed complex bifurcation lesion with 95% stenosis proximal LAD with calcification at take-off of large D2. The ostial/proximal D2 also had 95% calcified stenosis. Suspect this lesion played a role in her exertional dyspnea and also in her mildly decreased LV systolic function (EF 57-50% on echo). - S/p successful atherectomy with bifurcation stenting of proximal LAD and D2 05/15/18. No chest pain this admit.  - Continue ASA 81, statin, and Plavix.  3.Pulmonary hypertension: She appearedto have at least moderate pulmonary hypertension by echo with RV failure. CT chest showed mild emphysema, which should not explain her degree of RV failure. V/Q scan did not show evidence for chronic  PEs.Anti-centromere antibody, ANA, and RF were all positive. ?CREST variant of scleroderma.She was empirically started on sildenafil 20 mg tidby Dr. Francella Solian much effect so far. She remains quite symptomatic and is on home oxygen.[place] 05/15/18 showed moderate PAH but very high PVR and low cardiac output. This is concerning for advanced pulmonary hypertension. She is on home oxygen.  - Continued on sildenafil20 mg TID. Working on Electronics engineer for home.  - Eventually plan selexipag  -PFTs ordered, will try to get before she goes home. - She will need eventual sleep study.  - Oxygen saturation 92% on 3L  am of discharge. Can go home on 3 L oxygen, can increase to 5 with ambulation.  4. Acute on chronic diastolic CHF/RV failure: LV and RV filling pressures were well-compensated on recent RHC.  However, thought to be volume overloaded and given IV Lasix this admission.   - Volume status stable on exam day of discharge. Stable at 1.5.  - Plan to send home with Lasix 20 mg po + KCl 20 daily.  5. Diabetes:  - Covered with SSI while in house. Resume home meds for home.  Pt will be discharged home with [facility] for O2 with close follow up as below. O2 increase from 2L at rest to 3L at rest, up to 5L at rest or with ambulation, as needed.   Discharge Weight Range: 145.2 lbs Discharge Vitals: Blood pressure 116/64, pulse 77, temperature 97.8 F (36.6 C), temperature source Oral, resp. rate 18, height _0  (1.575 m), weight 65.9 kg, SpO2 92 %.  Labs: Lab Results  Component Value Date   WBC 7.8 05/21/2018  HGB 8.9 (L) 05/21/2018   HCT 30.2 (L) 05/21/2018   MCV 85.6 05/21/2018   PLT 279 05/21/2018    Recent Labs  Lab 05/18/18 1711  05/21/18 0312  NA 136   < > 138  K 4.8   < > 3.2*  CL 107   < > 106  CO2 22   < > 22  BUN 37*   < > 41*  CREATININE 1.57*   < > 1.54*  CALCIUM 9.1   < > 8.9  PROT 7.0  --   --   BILITOT 0.7  --   --   ALKPHOS 50  --   --   ALT 11  --   --   AST  19  --   --   GLUCOSE 170*   < > 161*   < > = values in this interval not displayed.   No results found for: CHOL, HDL, LDLCALC, TRIG BNP (last 3 results) Recent Labs    05/08/18 1201  BNP 1,105.0*    ProBNP (last 3 results) No results for input(s): PROBNP in the last 8760 hours.   Diagnostic Studies/Procedures   Dg Chest Port 1 View  Result Date: 05/19/2018 CLINICAL DATA:  CHF. EXAM: PORTABLE CHEST 1 VIEW COMPARISON:  05/18/2018 FINDINGS: The patient is mildly rotated to the right. The cardiac silhouette remains enlarged. Aortic atherosclerosis is noted. There is mild chronic interstitial coarsening/chronic bronchitic change. Mild streaky opacity persists in the left lung base. Linear opacities in the right lung base and left midlung on the prior study are no longer apparent. No overt pulmonary edema, sizable pleural effusion, or pneumothorax is identified. No acute osseous abnormality is seen. IMPRESSION: Persistent streaky left basilar atelectasis or infiltrate. Electronically Signed   By: Logan Bores M.D.   On: 05/19/2018 16:05    Discharge Medications   Allergies as of 05/21/2018      Reactions   Sulfa Antibiotics Swelling   Mouth and tongue swelling      Medication List    STOP taking these medications   hydrochlorothiazide 25 MG tablet Commonly known as:  HYDRODIURIL     TAKE these medications   ALPRAZolam 0.25 MG tablet Commonly known as:  XANAX Take 0.25 mg by mouth daily as needed for anxiety.   aspirin EC 81 MG tablet Take 1 tablet (81 mg total) by mouth daily.   atorvastatin 80 MG tablet Commonly known as:  LIPITOR Take 1 tablet (80 mg total) by mouth daily.   carvedilol 3.125 MG tablet Commonly known as:  COREG Take 1 tablet (3.125 mg total) by mouth 2 (two) times daily with a meal. What changed:    medication strength  how much to take   chlorhexidine 0.12 % solution Commonly known as:  PERIDEX 15 mLs by Mouth Rinse route 2 (two) times  daily as needed (sore gums).   clopidogrel 75 MG tablet Commonly known as:  PLAVIX Take 1 tablet (75 mg total) by mouth daily.   cyclobenzaprine 10 MG tablet Commonly known as:  FLEXERIL Take 1 tablet (10 mg total) by mouth 3 (three) times daily as needed for muscle spasms. What changed:  when to take this   furosemide 20 MG tablet Commonly known as:  LASIX Take 1 tablet (20 mg total) by mouth daily. Start taking on:  05/22/2018   gabapentin 300 MG capsule Commonly known as:  NEURONTIN Take 300 mg by mouth at bedtime.   lactose free nutrition Liqd Take 237  mLs by mouth 2 (two) times daily between meals.   macitentan 10 MG tablet Commonly known as:  OPSUMIT Take 1 tablet (10 mg total) by mouth daily. Will need appointment with Dr. Aundra Dubin for further refills   mirtazapine 30 MG tablet Commonly known as:  REMERON Take 30 mg by mouth at bedtime.   pantoprazole 40 MG tablet Commonly known as:  PROTONIX Take 1 tablet (40 mg total) by mouth 2 (two) times daily. What changed:  when to take this   potassium chloride SA 20 MEQ tablet Commonly known as:  K-DUR,KLOR-CON Take 1 tablet (20 mEq total) by mouth daily.   sildenafil 20 MG tablet Commonly known as:  REVATIO Take 1 tablet (20 mg total) by mouth 3 (three) times daily.   traMADol-acetaminophen 37.5-325 MG tablet Commonly known as:  ULTRACET Take 2 tablets by mouth 4 (four) times daily as needed for pain.            Durable Medical Equipment  (From admission, onward)         Start     Ordered   05/21/18 1333  For home use only DME oxygen  Once    Question Answer Comment  Mode or (Route) Nasal cannula   Liters per Minute 5   Frequency Continuous (stationary and portable oxygen unit needed)   Oxygen conserving device Yes   Oxygen delivery system Gas      05/21/18 1332          Disposition   The patient will be discharged in stable condition to home.  Follow-up Information    [facility]. Go on 06/01/2018.   Specialty:  Cardiology Why:  at 10:00 AM in the [facility].  Please bring all medications to appt.  Gate code for October 1700. Contact information: [address] [id number] [place] [phone]            Duration of Discharge Encounter: Greater than 35 minutes   Signed, Annamaria Helling 05/21/2018, 2:11 PM

## 2018-05-21 NOTE — Telephone Encounter (Signed)
New Message    Pt c/o medication issue:  1. Name of Medication: nitrostat  2. How are you currently taking this medication (dosage and times per day)?   3. Are you having a reaction (difficulty breathing--STAT)?   4. What is your medication issue? Ulice Dash with the pharmacy is calling in reference to rx that they received this morning. It shows that the rx was written by Robbie Lis. Their concern is that the patient also takes sildenafil (REVATIO) 20 MG tablet so they want to be sure that the nitrostat should still be filled. Please call.

## 2018-05-21 NOTE — Progress Notes (Signed)
VASCULAR SURGERY ASSESSMENT & PLAN:   RIGHT FEMORAL ARTERY PSEUDOANEURYSM: The patient is status post successful duplex directed manual occlusion of her right femoral artery pseudoaneurysm.  Follow-up study showed successful thrombosis.  From our standpoint she can be discharged.  We will be available as needed.  SUBJECTIVE:   No complaints this morning.  PHYSICAL EXAM:   Vitals:   05/20/18 1942 05/20/18 2345 05/21/18 0500 05/21/18 0646  BP: (!) 100/53 (!) 97/54 110/64   Pulse: 82 75 76   Resp: (!) _0 Temp: (!) 97.5 F (36.4 C) (!) 97.4 F (36.3 C) 97.8 F (36.6 C)   TempSrc: Oral Oral Oral   SpO2: 93% 96% 92%   Weight:    65.9 kg  Height:       Hematoma right groin nontender  LABS:   Lab Results  Component Value Date   WBC 7.8 05/21/2018   HGB 8.9 (L) 05/21/2018   HCT 30.2 (L) 05/21/2018   MCV 85.6 05/21/2018   PLT 279 05/21/2018   CBG (last 3)  Recent Labs    05/19/18 1320  GLUCAP 153*    PROBLEM LIST:    Active Problems:   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 (HCC)   CURRENT MEDS:   . aspirin EC  81 mg Oral Daily  . atorvastatin  80 mg Oral Daily  . carvedilol  3.125 mg Oral BID WC  . clopidogrel  75 mg Oral Daily  . cyclobenzaprine  10 mg Oral BID  . gabapentin  300 mg Oral QHS  . macitentan  10 mg Oral Daily  . mouth rinse  15 mL Mouth Rinse BID  . mirtazapine  30 mg Oral QHS  . pantoprazole  40 mg Oral Daily  . sildenafil  20 mg Oral TID    [place]: [phone] Office: [phone] 05/21/2018

## 2018-05-21 NOTE — Evaluation (Addendum)
Physical Therapy Evaluation Patient Details Name: [name] MRN: [id number] DOB: 06-27-1932 Today's Date: 05/21/2018   History of Present Illness  Pt is a 84 y.o. F with significant PMH of CAD s/p recent complex PCI of LAD/diagonal bifurfaction lesion, pulmonary hypotension, who presents on 10/14 wkth groin pain. R groin ultrasound showed pseudoaneurysm. She had US guided manual compression with closure of femoral artery PSA.    Clinical Impression  Pt admitted with above diagnosis. Pt currently with functional limitations due to the deficits listed below (see PT Problem List). Patient seems fairly close to functional mobility baseline. Prior to admission, she was independent with ADL's/IADL's and mobility; she does use a walker at night when she goes to the bathroom. Currently, patient ambulating 50 feet with no assistive device on 8L O2 via high flow [place], SpO2 > 90%. Presents with decreased gait speed and mild balance impairments. Suspect patient will progress quickly. Pt will benefit from skilled PT to increase their independence and safety with mobility to allow discharge to the venue listed below.       Follow Up Recommendations No PT follow up;Supervision - Intermittent    Equipment Recommendations  None recommended by PT    Recommendations for Other Services       Precautions / Restrictions Precautions Precautions: None Restrictions Weight Bearing Restrictions: No      Mobility  Bed Mobility Overal bed mobility: Modified Independent                Transfers Overall transfer level: Modified independent                  Ambulation/Gait Ambulation/Gait assistance: Supervision Gait Distance (Feet): 50 Feet Assistive device: None Gait Pattern/deviations: Step-through pattern;Decreased stride length Gait velocity: decreased Gait velocity interpretation: <1.8 ft/sec, indicate of risk for recurrent falls General Gait Details: Slow cadence and decreased  stride length but no evidence of gross imbalance. SpO2 > 90% on 8L O2 via high flow [place]. HR 80-90 bpm.  Stairs            Wheelchair Mobility    Modified Rankin (Stroke Patients Only)       Balance Overall balance assessment: Mild deficits observed, not formally tested                                           Pertinent Vitals/Pain Pain Assessment: No/denies pain    Home Living Family/patient expects to be discharged to:: Private residence Living Arrangements: Alone Available Help at Discharge: Family;Available 24 hours/day(daughter) Type of Home: House Home Access: Level entry     Home Layout: One level Home Equipment: Clinical cytogeneticist - 2 wheels;Cane - single point      Prior Function Level of Independence: Independent with assistive device(s)         Comments: Uses walker at night when she is walking to the bathroom. denies history of falls. On chronic 2L O2 at home.      Hand Dominance        Extremity/Trunk Assessment   Upper Extremity Assessment Upper Extremity Assessment: Overall WFL for tasks assessed    Lower Extremity Assessment Lower Extremity Assessment: Overall WFL for tasks assessed    Cervical / Trunk Assessment Cervical / Trunk Assessment: Normal  Communication   Communication: No difficulties  Cognition Arousal/Alertness: Awake/alert Behavior During Therapy: WFL for tasks assessed/performed Overall Cognitive Status: Within Functional  Limits for tasks assessed                                        General Comments      Exercises     Assessment/Plan    PT Assessment Patient needs continued PT services  PT Problem List Decreased strength;Decreased activity tolerance;Decreased balance;Decreased mobility;Cardiopulmonary status limiting activity       PT Treatment Interventions DME instruction;Gait training;Functional mobility training;Therapeutic activities;Therapeutic exercise;Balance  training;Patient/family education    PT Goals (Current goals can be found in the Care Plan section)  Acute Rehab PT Goals Patient Stated Goal: "go home." PT Goal Formulation: With patient Time For Goal Achievement: 06/04/18 Potential to Achieve Goals: Good    Frequency Min 3X/week   Barriers to discharge        Co-evaluation               AM-PAC PT "6 Clicks" Daily Activity  Outcome Measure Difficulty turning over in bed (including adjusting bedclothes, sheets and blankets)?: None Difficulty moving from lying on back to sitting on the side of the bed? : None Difficulty sitting down on and standing up from a chair with arms (e.g., wheelchair, bedside commode, etc,.)?: None Help needed moving to and from a bed to chair (including a wheelchair)?: [name] needed walking in hospital room?: [name] needed climbing 3-5 steps with a railing? : A Little 6 Click Score: 21    End of Session Equipment Utilized During Treatment: Gait belt;Oxygen Activity Tolerance: Patient tolerated treatment well Patient left: in chair;with call bell/phone within reach Nurse Communication: Mobility status PT Visit Diagnosis: Unsteadiness on feet (R26.81);Difficulty in walking, not elsewhere classified (R26.2)    Time: 2567-2091 PT Time Calculation (min) (ACUTE ONLY): 20 min   Charges:   PT Evaluation $PT Eval Moderate Complexity: 1 Mod         Ellamae Sia, [place], DPT Acute Rehabilitation Services Pager [phone] Office [phone]   Willy Eddy 05/21/2018, 9:19 AM

## 2018-05-21 NOTE — Plan of Care (Signed)

## 2018-05-21 NOTE — Progress Notes (Addendum)
SATURATION QUALIFICATIONS: (This note is used to comply with regulatory documentation for home oxygen)  Patient Saturations on Room Air at Rest = 80%  Patient saturations on 3 Liters of oxygen while ambulating 86-87%  Patient Saturations on 5 Liters of oxygen while Ambulating = 90% but after a while at rest desaturated to 85% on 5L/min  Please briefly explain why patient needs home oxygen:

## 2018-05-21 NOTE — Telephone Encounter (Signed)
Error

## 2018-05-21 NOTE — Telephone Encounter (Signed)
Per Dr. Aundra Dubin.Marland KitchenMarland KitchenUlice Erickson with [facility] in [place] advised that the pt should not be on Nitrostat.Marland Kitchen He says that he will delete the prescription.

## 2018-05-21 NOTE — Care Management Note (Addendum)
Case Management Note Previous note created by Olga Coaster  Patient Details  Name: [name] MRN: [id number] Date of Birth: 1932/04/19  Subjective/Objective:    Femoral artery pseudaneurysm               Action/Plan: Patient lives at home; Primary Physician: [name], PA-C; has private insurance with [facility] with prescription drug coverage; CM following for progression of care.  Expected Discharge Date:  Undetermined at this time               Expected Discharge Plan:   home with possible HHC when medically stable  Status of Service:   In progress  Maryclare Labrador, RN  05/21/2018  Update:  Pt interested in [place] in agreement due to increase in resp needs prior to discharge.  Pt/daughter chose [facility] - agency contacted and referral accepted.  New portable oxygen tank delivered to pts room while CM was bedside  Pt to discharge home today driven by daughter in private vehicle.  PTA pt was on 2 liter continuous oxygen supplied by [facility] out of .  CM contacted DME agency and agency accepted increase in oxygen (also confirmed they can provide the High Flow 5 liters continous - CM faxed both pulm ambulatory note and oxygen order to [facility] at [phone] - agency confirmed required documentation received.  Agency to bring appropriate portable tanks to hospital today to allow for safe transport home.

## 2018-05-21 NOTE — Progress Notes (Signed)
Pt going home with daugther via w/c with belongings, prescriptions, follow up appt. And oxygen.

## 2018-05-21 NOTE — Telephone Encounter (Signed)
Should not take nitroglycerin

## 2018-05-21 NOTE — Evaluation (Signed)
Occupational Therapy Evaluation Patient Details Name: [name] MRN: [id number] DOB: [birth year] Today's Date: 05/21/2018    History of Present Illness Pt is a 84 y.o. F with significant PMH of CAD s/p recent complex PCI of LAD/diagonal bifurfaction lesion, pulmonary hypotension, who presents on 10/14 wkth groin pain. R groin ultrasound showed pseudoaneurysm. She had US guided manual compression with closure of femoral artery PSA.     Clinical Impression   Pt typically walks without a device and is modified independent in self care and light housekeeping and meal prep. She uses 2L 02. Pt lives near her daughter who is supportive. Pt presents with decreased activity tolerance and required 5L to maintain Sp02 of 91% at end of session once returned to chair, RN made aware. Will follow, do not anticipate pt will need post acute OT.    Follow Up Recommendations  No OT follow up    Equipment Recommendations  None recommended by OT    Recommendations for Other Services       Precautions / Restrictions Precautions Precautions: None Restrictions Weight Bearing Restrictions: No      Mobility Bed Mobility Overal bed mobility: Modified Independent                Transfers Overall transfer level: Modified independent Equipment used: None                  Balance Overall balance assessment: Mild deficits observed, not formally tested                                         ADL either performed or assessed with clinical judgement   ADL Overall ADL's : Needs assistance/impaired Eating/Feeding: Independent;Sitting   Grooming: Wash/dry hands;Standing;Supervision/safety   Upper Body Bathing: Set up;Sitting   Lower Body Bathing: Sit to/from stand;Set up   Upper Body Dressing : Set up;Sitting   Lower Body Dressing: Sit to/from stand;Set up   Toilet Transfer: Supervision/safety;Ambulation   Toileting- Clothing Manipulation and Hygiene: Sit  to/from stand;Modified independent       Functional mobility during ADLs: Supervision/safety General ADL Comments: pt requiring 5L and pursed lip breathing to keep Sp02> 90%     Vision Baseline Vision/History: Wears glasses Patient Visual Report: No change from baseline       Perception     Praxis      Pertinent Vitals/Pain Pain Assessment: No/denies pain     Hand Dominance Right   Extremity/Trunk Assessment Upper Extremity Assessment Upper Extremity Assessment: Overall WFL for tasks assessed   Lower Extremity Assessment Lower Extremity Assessment: Overall WFL for tasks assessed   Cervical / Trunk Assessment Cervical / Trunk Assessment: Normal   Communication Communication Communication: No difficulties   Cognition Arousal/Alertness: Awake/alert Behavior During Therapy: WFL for tasks assessed/performed Overall Cognitive Status: Within Functional Limits for tasks assessed                                     General Comments       Exercises     Shoulder Instructions      Home Living Family/patient expects to be discharged to:: Private residence Living Arrangements: Alone Available Help at Discharge: Family;Available 24 hours/day(daughter lives behind family) Type of Home: House Home Access: Level entry     Home Layout: One level  Bathroom Shower/Tub: Occupational psychologist: Standard     Home Equipment: Clinical cytogeneticist - 2 wheels;Cane - single point(02)          Prior Functioning/Environment Level of Independence: Independent with assistive device(s)        Comments: Uses walker at night when she is walking to the bathroom. denies history of falls. On chronic 2L O2 at home.         OT Problem List: Cardiopulmonary status limiting activity      OT Treatment/Interventions: Energy conservation    OT Goals(Current goals can be found in the care plan section) Acute Rehab OT Goals Patient Stated Goal: "go  home." OT Goal Formulation: With patient Time For Goal Achievement: 05/28/18 Potential to Achieve Goals: Good ADL Goals Additional ADL Goal #1: Pt will generalize energy conservation strategies and breathing techniques in ADL and mobility independently.  OT Frequency: Min 2X/week   Barriers to D/C:            Co-evaluation              AM-PAC PT "6 Clicks" Daily Activity     Outcome Measure Help from another person eating meals?: None Help from another person taking care of personal grooming?: [name] from another person toileting, which includes using toliet, bedpan, or urinal?: None Help from another person bathing (including washing, rinsing, drying)?: None Help from another person to put on and taking off regular upper body clothing?: None Help from another person to put on and taking off regular lower body clothing?: None 6 Click Score: 23   End of Session Equipment Utilized During Treatment: Gait belt;Oxygen Nurse Communication: Other (comment)(aware of pts 02 sats)  Activity Tolerance: Patient tolerated treatment well Patient left: in chair;with call bell/phone within reach;with family/visitor present  OT Visit Diagnosis: Other (comment)(decreased activity tolerance)                Time: 3888-7579 OT Time Calculation (min): 16 min Charges:  OT General Charges $OT Visit: 1 Visit OT Evaluation $OT Eval Moderate Complexity: [place], OTR/L Acute Rehabilitation Services Pager: [phone] Office: [phone]  Malka So 05/21/2018, 10:44 AM

## 2018-05-21 NOTE — Progress Notes (Signed)
Patient ID: [name], female   DOB: 1931/11/09, 84 y.o.   MRN: [id number]     Advanced Heart Failure Rounding Note  PCP-Cardiologist: No primary care provider on file.   Subjective:    Pt discharged 05/16/18 after a complex PCI requiring atherectomy of an LAD diagonal bifurcation lesion on 10/11.  She was discharged on 10/12; her hemoglobin at that time was 9.9 (drop of 2 g overnight) and hematoma noted.   Presented to [facility] 05/18/18 with groin pain. R groin [place] 05/18/18 with 1.5 x 1.2 cm pseudoaneurysm.  She had US-guided manual compression with closure of femoral artery PSA.   She already walked today with PT, did well. She had IV Lasix yesterday.    Objective:   Weight Range: 65.9 kg Body mass index is 26.57 kg/m.   Vital Signs:   Temp:  [97.4 F (36.3 C)-98.3 F (36.8 C)] 97.8 F (36.6 C) (10/17 0743) Pulse Rate:  [75-85] 77 (10/17 0743) Resp:  [14-21] 18 (10/17 0743) BP: (97-116)/(53-64) 116/64 (10/17 0743) SpO2:  [90 %-96 %] 92 % (10/17 0743) Weight:  [64.5 kg-65.9 kg] 65.9 kg (10/17 0646) Last BM Date: 05/19/18  Weight change: Filed Weights   05/19/18 0351 05/20/18 0938 05/21/18 0646  Weight: 66.5 kg 64.5 kg 65.9 kg    Intake/Output:   Intake/Output Summary (Last 24 hours) at 05/21/2018 0850 Last data filed at 05/21/2018 0758 Gross per 24 hour  Intake 191.8 ml  Output 1420 ml  Net -1228.2 ml      Physical Exam    General: NAD Neck: No JVD, no thyromegaly or thyroid nodule.  Lungs: Clear to auscultation bilaterally with normal respiratory effort. CV: Nondisplaced PMI.  Heart regular S1/S2, no S3/S4, no murmur.  No peripheral edema.   Abdomen: Soft, nontender, no hepatosplenomegaly, no distention.  Skin: Intact without lesions or rashes.  Neurologic: Alert and oriented x 3.  Psych: Normal affect. Extremities: No clubbing or cyanosis.  HEENT: Normal.   Telemetry   NSR 70s, personally reviewed.   Labs    CBC Recent Labs    05/18/18 1711   05/20/18 0718 05/21/18 0312  WBC 9.5   < > 8.1 7.8  NEUTROABS 6.8  --   --   --   HGB 9.5*   < > 9.4* 8.9*  HCT 33.1*   < > 31.8* 30.2*  MCV 89.9   < > 84.6 85.6  PLT 197   < > 212 279   < > = values in this interval not displayed.   Basic Metabolic Panel Recent Labs    05/20/18 0242 05/20/18 0718 05/21/18 0312  NA 138  --  138  K 3.2*  --  3.2*  CL 107  --  106  CO2 19*  --  22  GLUCOSE 147*  --  161*  BUN 39*  --  41*  CREATININE 1.50*  --  1.54*  CALCIUM 8.9  --  8.9  MG  --  1.4* 2.5*   Liver Function Tests Recent Labs    05/18/18 1711  AST 19  ALT 11  ALKPHOS 50  BILITOT 0.7  PROT 7.0  ALBUMIN 3.1*   No results for input(s): LIPASE, AMYLASE in the last 72 hours. Cardiac Enzymes No results for input(s): CKTOTAL, CKMB, CKMBINDEX, TROPONINI in the last 72 hours.  BNP: BNP (last 3 results) Recent Labs    05/08/18 1201  BNP 1,105.0*    ProBNP (last 3 results) No results for input(s): PROBNP in  the last 8760 hours.   D-Dimer No results for input(s): DDIMER in the last 72 hours. Hemoglobin A1C No results for input(s): HGBA1C in the last 72 hours. Fasting Lipid Panel No results for input(s): CHOL, HDL, LDLCALC, TRIG, CHOLHDL, LDLDIRECT in the last 72 hours. Thyroid Function Tests No results for input(s): TSH, T4TOTAL, T3FREE, THYROIDAB in the last 72 hours.  Invalid input(s): FREET3  Other results:   Imaging    No results found.   Medications:     Scheduled Medications: . aspirin EC  81 mg Oral Daily  . atorvastatin  80 mg Oral Daily  . carvedilol  3.125 mg Oral BID WC  . clopidogrel  75 mg Oral Daily  . cyclobenzaprine  10 mg Oral BID  . gabapentin  300 mg Oral QHS  . macitentan  10 mg Oral Daily  . mouth rinse  15 mL Mouth Rinse BID  . mirtazapine  30 mg Oral QHS  . pantoprazole  40 mg Oral Daily  . sildenafil  20 mg Oral TID    Infusions:   PRN Medications: ALPRAZolam, naloxone, ondansetron (ZOFRAN) IV,  traMADol-acetaminophen    Patient Profile   [name] is a 84 y.o. female with PMH of DM, HTN, osteomyelitis, pulmonary HTN, RV failure, and PNA in 10/2017.  She was admitted after [facility] 10/10. Discharged on 10/12 19 after complex PCI  Returned to [facility] 05/18/18 with R groin pain.   Assessment/Plan   1. R groin pseudoaneurysm: R groin [place] 05/18/18 with 1.5 x 1.2 cm pseudoaneurysm from procedure 05/15/18.  Hgb stable and discomfort significantly improved.  Has had US-guided manual compression with closure of PSA by Dr. Scot Dock.  2. CAD: Coronary angiography showed complex bifurcation lesion with 95% stenosis proximal LAD with calcification at take-off of large D2. The ostial/proximal D2 also had 95% calcified stenosis. I suspect this lesion played a role in her exertional dyspnea and also in her mildly decreased LV systolic function (EF 30-16% on echo). S/p successful atherectomy with bifurcation stenting of proximal LAD and D2 last week. No chest pain.  - Continue ASA 81, statin, and Plavix.  3.Pulmonary hypertension: She appearedto have at least moderate pulmonary hypertension by echo with RV failure. CT chest showed mild emphysema, which should not explain her degree of RV failure. V/Q scan did not show evidence for chronic PEs.Anti-centromere antibody, ANA, and RF were all positive. ?CREST variant of scleroderma.She was empirically started on sildenafil 20 mg tidby Dr. Francella Solian much effect so far. She remains quite symptomatic and is on home oxygen.[place] 05/15/18 showed moderate PAH but very high PVR and low cardiac output. This is concerning for advanced pulmonary hypertension. She is on home oxygen.  - Continue sildenafil 20 mg TID  - Recently ordered Opsumit 10 mg daily. Has been approved, but she has not yet received her home supply.  - Eventually plan selexipag  - PFTs ordered, will try to get before she goes home. - She will need eventual sleep study.  - Oxygen  saturation 92% on 3L [place] this morning, can go home on 3 L oxygen, can increase to 5 with ambulation.  4. Acute on chronic diastolic CHF/RV failure: LV and RV filling pressures were well-compensated on recent RHC.  However, thought to be volume overloaded and given IV Lasix this admission.  Volume looks better today.  Creatinine stable at 1.5.  - Would send home with Lasix 20 mg po + KCl 20 daily.  5. Diabetes:  - SSI while in  house.  6. Disposition: I think she can go home today after PFTs.  She will need close followup 10-14 days CHF clinic.  Will need to work on getting macitentan for home.  Meds for home: Lasix 20 daily, KCl 20 daily, macitentan 10 daily, sildenafil 20 tid, Plavix 75 daily, ASA 81 daily, Coreg 3.125 mg bid, atorvastatin 80 daily.   Length of Stay: 0  Loralie Champagne, MD  05/21/2018, 8:50 AM

## 2018-05-21 NOTE — Progress Notes (Signed)
SATURATION QUALIFICATIONS: (This note is used to comply with regulatory documentation for home oxygen)  Patient Saturations on Room Air at Rest = 80%    Patient Saturations on 5 Liters of high flow oxygen while Ambulating = 87% and at rest 90%  Please briefly explain why patient needs home oxygen:

## 2018-05-22 ENCOUNTER — Other Ambulatory Visit (HOSPITAL_COMMUNITY): Payer: Self-pay

## 2018-05-23 DIAGNOSIS — M1990 Unspecified osteoarthritis, unspecified site: Secondary | ICD-10-CM | POA: Diagnosis not present

## 2018-05-23 DIAGNOSIS — I5032 Chronic diastolic (congestive) heart failure: Secondary | ICD-10-CM | POA: Diagnosis not present

## 2018-05-23 DIAGNOSIS — Z9181 History of falling: Secondary | ICD-10-CM | POA: Diagnosis not present

## 2018-05-23 DIAGNOSIS — I724 Aneurysm of artery of lower extremity: Secondary | ICD-10-CM | POA: Diagnosis not present

## 2018-05-23 DIAGNOSIS — I110 Hypertensive heart disease with heart failure: Secondary | ICD-10-CM | POA: Diagnosis not present

## 2018-05-23 DIAGNOSIS — Z96653 Presence of artificial knee joint, bilateral: Secondary | ICD-10-CM | POA: Diagnosis not present

## 2018-05-23 DIAGNOSIS — Z9981 Dependence on supplemental oxygen: Secondary | ICD-10-CM | POA: Diagnosis not present

## 2018-05-23 DIAGNOSIS — Z7902 Long term (current) use of antithrombotics/antiplatelets: Secondary | ICD-10-CM | POA: Diagnosis not present

## 2018-05-23 DIAGNOSIS — Z7982 Long term (current) use of aspirin: Secondary | ICD-10-CM | POA: Diagnosis not present

## 2018-05-23 DIAGNOSIS — Z85828 Personal history of other malignant neoplasm of skin: Secondary | ICD-10-CM | POA: Diagnosis not present

## 2018-05-23 DIAGNOSIS — I2510 Atherosclerotic heart disease of native coronary artery without angina pectoris: Secondary | ICD-10-CM | POA: Diagnosis not present

## 2018-05-23 DIAGNOSIS — E119 Type 2 diabetes mellitus without complications: Secondary | ICD-10-CM | POA: Diagnosis not present

## 2018-05-23 DIAGNOSIS — K219 Gastro-esophageal reflux disease without esophagitis: Secondary | ICD-10-CM | POA: Diagnosis not present

## 2018-05-23 DIAGNOSIS — Z79891 Long term (current) use of opiate analgesic: Secondary | ICD-10-CM | POA: Diagnosis not present

## 2018-05-23 DIAGNOSIS — I272 Pulmonary hypertension, unspecified: Secondary | ICD-10-CM | POA: Diagnosis not present

## 2018-05-23 DIAGNOSIS — Z8673 Personal history of transient ischemic attack (TIA), and cerebral infarction without residual deficits: Secondary | ICD-10-CM | POA: Diagnosis not present

## 2018-05-29 DIAGNOSIS — Z9981 Dependence on supplemental oxygen: Secondary | ICD-10-CM | POA: Diagnosis not present

## 2018-05-29 DIAGNOSIS — K219 Gastro-esophageal reflux disease without esophagitis: Secondary | ICD-10-CM | POA: Diagnosis not present

## 2018-05-29 DIAGNOSIS — I110 Hypertensive heart disease with heart failure: Secondary | ICD-10-CM | POA: Diagnosis not present

## 2018-05-29 DIAGNOSIS — Z7902 Long term (current) use of antithrombotics/antiplatelets: Secondary | ICD-10-CM | POA: Diagnosis not present

## 2018-05-29 DIAGNOSIS — M1990 Unspecified osteoarthritis, unspecified site: Secondary | ICD-10-CM | POA: Diagnosis not present

## 2018-05-29 DIAGNOSIS — Z8673 Personal history of transient ischemic attack (TIA), and cerebral infarction without residual deficits: Secondary | ICD-10-CM | POA: Diagnosis not present

## 2018-05-29 DIAGNOSIS — Z85828 Personal history of other malignant neoplasm of skin: Secondary | ICD-10-CM | POA: Diagnosis not present

## 2018-05-29 DIAGNOSIS — I5032 Chronic diastolic (congestive) heart failure: Secondary | ICD-10-CM | POA: Diagnosis not present

## 2018-05-29 DIAGNOSIS — I2510 Atherosclerotic heart disease of native coronary artery without angina pectoris: Secondary | ICD-10-CM | POA: Diagnosis not present

## 2018-05-29 DIAGNOSIS — Z96653 Presence of artificial knee joint, bilateral: Secondary | ICD-10-CM | POA: Diagnosis not present

## 2018-05-29 DIAGNOSIS — E119 Type 2 diabetes mellitus without complications: Secondary | ICD-10-CM | POA: Diagnosis not present

## 2018-05-29 DIAGNOSIS — Z9181 History of falling: Secondary | ICD-10-CM | POA: Diagnosis not present

## 2018-05-29 DIAGNOSIS — I272 Pulmonary hypertension, unspecified: Secondary | ICD-10-CM | POA: Diagnosis not present

## 2018-05-29 DIAGNOSIS — Z7982 Long term (current) use of aspirin: Secondary | ICD-10-CM | POA: Diagnosis not present

## 2018-05-29 DIAGNOSIS — I724 Aneurysm of artery of lower extremity: Secondary | ICD-10-CM | POA: Diagnosis not present

## 2018-05-29 DIAGNOSIS — Z79891 Long term (current) use of opiate analgesic: Secondary | ICD-10-CM | POA: Diagnosis not present

## 2018-06-01 ENCOUNTER — Encounter (HOSPITAL_COMMUNITY): Payer: Medicare Other

## 2018-06-04 DIAGNOSIS — Z9181 History of falling: Secondary | ICD-10-CM | POA: Diagnosis not present

## 2018-06-04 DIAGNOSIS — Z79891 Long term (current) use of opiate analgesic: Secondary | ICD-10-CM | POA: Diagnosis not present

## 2018-06-04 DIAGNOSIS — M1990 Unspecified osteoarthritis, unspecified site: Secondary | ICD-10-CM | POA: Diagnosis not present

## 2018-06-04 DIAGNOSIS — I5032 Chronic diastolic (congestive) heart failure: Secondary | ICD-10-CM | POA: Diagnosis not present

## 2018-06-04 DIAGNOSIS — I272 Pulmonary hypertension, unspecified: Secondary | ICD-10-CM | POA: Diagnosis not present

## 2018-06-04 DIAGNOSIS — Z9981 Dependence on supplemental oxygen: Secondary | ICD-10-CM | POA: Diagnosis not present

## 2018-06-04 DIAGNOSIS — Z96653 Presence of artificial knee joint, bilateral: Secondary | ICD-10-CM | POA: Diagnosis not present

## 2018-06-04 DIAGNOSIS — K219 Gastro-esophageal reflux disease without esophagitis: Secondary | ICD-10-CM | POA: Diagnosis not present

## 2018-06-04 DIAGNOSIS — Z8673 Personal history of transient ischemic attack (TIA), and cerebral infarction without residual deficits: Secondary | ICD-10-CM | POA: Diagnosis not present

## 2018-06-04 DIAGNOSIS — Z7982 Long term (current) use of aspirin: Secondary | ICD-10-CM | POA: Diagnosis not present

## 2018-06-04 DIAGNOSIS — I110 Hypertensive heart disease with heart failure: Secondary | ICD-10-CM | POA: Diagnosis not present

## 2018-06-04 DIAGNOSIS — E119 Type 2 diabetes mellitus without complications: Secondary | ICD-10-CM | POA: Diagnosis not present

## 2018-06-04 DIAGNOSIS — I2510 Atherosclerotic heart disease of native coronary artery without angina pectoris: Secondary | ICD-10-CM | POA: Diagnosis not present

## 2018-06-04 DIAGNOSIS — I724 Aneurysm of artery of lower extremity: Secondary | ICD-10-CM | POA: Diagnosis not present

## 2018-06-04 DIAGNOSIS — Z85828 Personal history of other malignant neoplasm of skin: Secondary | ICD-10-CM | POA: Diagnosis not present

## 2018-06-04 DIAGNOSIS — Z7902 Long term (current) use of antithrombotics/antiplatelets: Secondary | ICD-10-CM | POA: Diagnosis not present

## 2018-06-08 NOTE — Progress Notes (Signed)
PCP: [name] Referring: Dr. Johnsie Cancel HF Cardiology: Dr. Aundra Dubin  84 yo with history of DM, HTN, and prior osteomyelitis of jaw f/u of pulmonary hypertension/RV failure. Symptoms worse after pneumonia March 2019 Hypoxemic at rest And primary started her on oxygen August 2019 I initially saw her August 2019 amlodipine stopped and started on sildenafil 20 mg tid without much benefit. TTE 02/20/18 EF 45-50% RV overload moderate TR estimated PA pressure 61 mmHg Referred to Dr Aundra Dubin right and left cath done 05/14/18.  Complex LAD/D2 lesion stented with atherectomy by Dr Ellyn Hack  Complicated by pseudoaneurysm  Right FA with successful Rx compression  She has had positive rheumatology blood work and was supposed to see Dr Amil Amen for scleroderma variant CREST w/u as cause of her pulmonary hypertension   Still with dyspnea now on Opsumit and revatio.  Has had flu shot. No chest pain cough sputum Or fever   PMH: 1. H/o osteomyelitis of jaw.  2. Duodenal ulcers with upper GI bleeding. 3. Type 2 diabetes 4. Hyperlipidemia 5. Squamous cell carcinoma of scalp with radiation and surgery.  6. GERD 7. H/o back surgery  8. HTN 9. Pulmonary hypertension/RV failure: Echo (7/19) with EF 45-50%, moderate focal basal septal hypertrophy, mild diffuse hypokinesis, septal flattening, severe RV dilation with severely decreased systolic function, moderate TR, PASP 61 mmHg.   - CT chest (7/19): Mild emphysema, enlarged PA.  - V/Q scan (8/19): No evidence for chronic PE.   SH: Lives in [place] alone, daughter lives very close.  Quit smoking > 20 years ago. No ETOH.   FH: No history of pulmonary hypertension.  Mother with "heart problems."  Sister with PPM.    ROS: All systems reviewed and negative except as per HPI.   Current Outpatient Medications  Medication Sig Dispense Refill  . ALPRAZolam (XANAX) 0.25 MG tablet Take 0.25 mg by mouth daily as needed for anxiety.     Marland Kitchen aspirin EC 81 MG tablet Take  1 tablet (81 mg total) by mouth daily. 30 tablet 11  . atorvastatin (LIPITOR) 80 MG tablet Take 1 tablet (80 mg total) by mouth daily. 30 tablet 6  . carvedilol (COREG) 3.125 MG tablet Take 1 tablet (3.125 mg total) by mouth 2 (two) times daily with a meal. 60 tablet 6  . chlorhexidine (PERIDEX) 0.12 % solution 15 mLs by Mouth Rinse route 2 (two) times daily as needed (sore gums).   4  . clopidogrel (PLAVIX) 75 MG tablet Take 1 tablet (75 mg total) by mouth daily. 30 tablet 6  . cyclobenzaprine (FLEXERIL) 10 MG tablet Take 1 tablet (10 mg total) by mouth 3 (three) times daily as needed for muscle spasms. (Patient taking differently: Take 10 mg by mouth 2 (two) times daily. ) 30 tablet 0  . furosemide (LASIX) 20 MG tablet Take 1 tablet (20 mg total) by mouth daily. 30 tablet 6  . gabapentin (NEURONTIN) 300 MG capsule Take 300 mg by mouth at bedtime.     . lactose free nutrition (BOOST) LIQD Take 237 mLs by mouth 2 (two) times daily between meals.     . macitentan (OPSUMIT) 10 MG tablet Take 1 tablet (10 mg total) by mouth daily. Will need appointment with Dr. Aundra Dubin for further refills 30 tablet 1  . mirtazapine (REMERON) 30 MG tablet Take 30 mg by mouth at bedtime.    . pantoprazole (PROTONIX) 40 MG tablet Take 1 tablet (40 mg total) by mouth 2 (two) times daily. (  Patient taking differently: Take 40 mg by mouth daily. ) 120 tablet 0  . potassium chloride SA (K-DUR,KLOR-CON) 20 MEQ tablet Take 1 tablet (20 mEq total) by mouth daily. 30 tablet 6  . sildenafil (REVATIO) 20 MG tablet Take 1 tablet (20 mg total) by mouth 3 (three) times daily. 90 tablet 11  . traMADol-acetaminophen (ULTRACET) 37.5-325 MG tablet Take 2 tablets by mouth 4 (four) times daily as needed for pain.  2   No current facility-administered medications for this visit.    BP 140/78   Pulse 91   Ht _0  (1.575 m)   Wt 145 lb 4 oz (65.9 kg)   BMI 26.57 kg/m  Affect appropriate Healthy:  appears stated age 70: normal Neck  supple with no adenopathy JVP normal no bruits no thyromegaly Lungs clear with no wheezing and good diaphragmatic motion Heart:  S1/S2 no murmur, no rub, gallop or click PMI normal Abdomen: benighn, BS positve, no tenderness, no AAA no bruit.  No HSM or HJR Distal pulses intact with no bruits No edema Neuro non-focal Skin warm and dry No muscular weakness   Assessment/Plan:  1. CAD:  Post complex atherectomy and stenting of LAD/D2 05/15/18 continue DAT 2. Pulmonary HTN:  Needs to f/u with Dr Aundra Dubin  opsumit started 05/16/18 continue revatio  V/Q negative 03/10/18 She had positive ANA, anti-centromere AB and RF ? CREST variant scleroderma was to see [facility] rheumatology at some point should transition care to pulmonary. Will do f/u TTE in 6 months to  Reassess EF and PA pressure on RX 3. HTN:  Well controlled.  Continue current medications and low sodium Dash type diet.   4. DM:  Discussed low carb diet.  Target hemoglobin A1c is 6.5 or less.  Continue current medications. 5. HLD:  With CAD and recent intervention continue statin   Jenkins Rouge 06/17/2018

## 2018-06-09 NOTE — Progress Notes (Signed)
Advanced Heart Failure Clinic Note  PCP: [name] Referring: Dr. Johnsie Cancel HF Cardiology: Dr. Aundra Dubin  84 y.o. with history of DM, HTN, and prior osteomyelitis of jaw was referred by Dr. Johnsie Cancel for evaluation of pulmonary hypertension/RV failure.  She reports an episode of PNA in 3/19 and says that she never recovered from this.  She had gradually progressive exertional dyspnea to the point where she was short of breath walking around the house.  She had an echo in 7/19 that showed mildly decreased LV systolic function but severely decreased RV systolic function with dilated RV and at least moderate pulmonary hypertension by echo estimation.  She saw her PCP in 8/19 and was noted to be hypoxemic at rest.  He started her on home oxygen 2 L/[place] which she continues.  She felt a little better on oxygen and is now able to walk farther. She still gets short of breath walking more than about 50 yards.  No orthopnea/PND.  She had an episode of syncope about 1 month ago while standing on her deck on a hot day.  She says that she was unconscious for < 1 minute.  No palpitations/sensation of racing HR.  No chest pain.  No joint pain or rash.    She was referred to cardiology, saw Dr. Johnsie Cancel. Amlodipine was stopped and she was started empirically on sildenafil 20 mg tid for pulmonary hypertension.  She says she has not noted any change in her symptoms since beginning sildenafil.   Pt discharged 05/16/18 after a complex PCI requiring atherectomy of an LAD diagonal bifurcation lesion on 10/11. She was discharged on 10/12;her hemoglobin at that time was 9.9 (drop of 2 g overnight) and hematoma noted.  Presented to [facility] 05/18/18 with groin pain. R groin [place] 05/18/18 with 1.5 x 1.2 cm pseudoaneurysm.She had US-guided manual compression with closure of femoral artery PSA by Dr Scot Dock 10/16. Diuresed with IV lasix and transitioned to lasix 20 mg daily. Discharged with home O2 to use 3L at rest, up to 5L with  ambulation. DC weight: 145 lbs.  She returns today for post hospital follow up. Overall doing well. Denies SOB with walking to the bathroom. Has not been too active otherwise. Wearing 4 L O2 at all times. Denies edema, orthopnea, or PND. No CP or dizziness. No further bleeding. Right groin is sore. No dizziness. She does not want to go to rehab because she has a hard time getting out of the out. Weighing 142-145 lbs at home. Limits fluid and salt intake. Taking all medications. She has a Higher education careers adviser through [facility] that comes once/week.   ECG (personally reviewed): NSR, inferolateral TWIs  PMH: 1. H/o osteomyelitis of jaw.  2. Duodenal ulcers with upper GI bleeding. 3. Type 2 diabetes 4. Hyperlipidemia 5. Squamous cell carcinoma of scalp with radiation and surgery.  6. GERD 7. H/o back surgery  8. HTN 9. Pulmonary hypertension/RV failure: Echo (7/19) with EF 45-50%, moderate focal basal septal hypertrophy, mild diffuse hypokinesis, septal flattening, severe RV dilation with severely decreased systolic function, moderate TR, PASP 61 mmHg.   - CT chest (7/19): Mild emphysema, enlarged PA.  - V/Q scan (8/19): No evidence for chronic PE.   SH: Lives in [place] alone, daughter lives very close.  Quit smoking > 20 years ago. No ETOH.   FH: No history of pulmonary hypertension.  Mother with "heart problems."  Sister with PPM.    Review of systems complete and found to be negative unless listed in  HPI.   Current Outpatient Medications  Medication Sig Dispense Refill  . ALPRAZolam (XANAX) 0.25 MG tablet Take 0.25 mg by mouth daily as needed for anxiety.     Marland Kitchen aspirin EC 81 MG tablet Take 1 tablet (81 mg total) by mouth daily. 30 tablet 11  . atorvastatin (LIPITOR) 80 MG tablet Take 1 tablet (80 mg total) by mouth daily. 30 tablet 6  . carvedilol (COREG) 3.125 MG tablet Take 1 tablet (3.125 mg total) by mouth 2 (two) times daily with a meal. 60 tablet 6  . chlorhexidine (PERIDEX) 0.12 % solution 15 mLs by  Mouth Rinse route 2 (two) times daily as needed (sore gums).   4  . clopidogrel (PLAVIX) 75 MG tablet Take 1 tablet (75 mg total) by mouth daily. 30 tablet 6  . cyclobenzaprine (FLEXERIL) 10 MG tablet Take 1 tablet (10 mg total) by mouth 3 (three) times daily as needed for muscle spasms. (Patient taking differently: Take 10 mg by mouth 2 (two) times daily. ) 30 tablet 0  . furosemide (LASIX) 20 MG tablet Take 1 tablet (20 mg total) by mouth daily. 30 tablet 6  . gabapentin (NEURONTIN) 300 MG capsule Take 300 mg by mouth at bedtime.     . lactose free nutrition (BOOST) LIQD Take 237 mLs by mouth 2 (two) times daily between meals.     . macitentan (OPSUMIT) 10 MG tablet Take 1 tablet (10 mg total) by mouth daily. Will need appointment with Dr. Aundra Dubin for further refills 30 tablet 1  . mirtazapine (REMERON) 30 MG tablet Take 30 mg by mouth at bedtime.    . pantoprazole (PROTONIX) 40 MG tablet Take 1 tablet (40 mg total) by mouth 2 (two) times daily. (Patient taking differently: Take 40 mg by mouth daily. ) 120 tablet 0  . potassium chloride SA (K-DUR,KLOR-CON) 20 MEQ tablet Take 1 tablet (20 mEq total) by mouth daily. 30 tablet 6  . sildenafil (REVATIO) 20 MG tablet Take 1 tablet (20 mg total) by mouth 3 (three) times daily. 90 tablet 11  . traMADol-acetaminophen (ULTRACET) 37.5-325 MG tablet Take 2 tablets by mouth 4 (four) times daily as needed for pain.  2   No current facility-administered medications for this encounter.    BP 136/82   Pulse 84   Wt 65.2 kg (143 lb 12.8 oz)   SpO2 91% Comment: 4L Nasal [place]  BMI 26.30 kg/m   Wt Readings from Last 3 Encounters:  06/10/18 65.2 kg (143 lb 12.8 oz)  05/21/18 65.9 kg (145 lb 4.5 oz)  05/16/18 64.5 kg (142 lb 3.2 oz)   General: Arrived in wheelchair. No resp difficulty. HEENT: Normal Neck: Supple. JVP 7-8. Carotids 2+ bilat; no bruits. No thyromegaly or nodule noted. Cor: PMI nondisplaced. RRR, No M/G/R noted Lungs: CTAB, normal  effort. Abdomen: Soft, non-tender, non-distended, no HSM. No bruits or masses. +BS  Extremities: No cyanosis, clubbing, or rash. R and LLE no edema. BLE TED hose on. Right groin well healed. No bruising or hematoma. Neuro: Alert & orientedx3, cranial nerves grossly intact. moves all 4 extremities w/o difficulty. Affect pleasant   1. CAD: Coronary angiography showed complex bifurcation lesion with 95% stenosis proximal LAD with calcification at take-off of large D2. The ostial/proximal D2 also had 95% calcified stenosis. Suspect this lesion played a role in her exertional dyspnea and also in her mildly decreased LV systolic function (EF 88-82% on echo). - S/p successful atherectomy with bifurcation stenting of proximal LAD and D2  05/15/18. No s/s ischemia. - Continue ASA 81, statin, and Plavix.  2.Pulmonary hypertension: She appearedto have at least moderate pulmonary hypertension by echo with RV failure. CT chest showed mild emphysema, which should not explain her degree of RV failure. V/Q scan did not show evidence for chronic PEs.Anti-centromere antibody, ANA, and RF were all positive. ?CREST variant of scleroderma.She was empirically started on sildenafil 20 mg tidby Dr. Francella Solian much effect so far. She remains quite symptomatic and is on home oxygen.[place] 05/15/18 showed moderate PAH but very high PVR and low cardiac output. This is concerning for advanced pulmonary hypertension. She is on home oxygen.  - Continued on sildenafil20 mg TID. She started Opsumit 10 mg daily 3 weeks ago.   - Eventually plan selexipag   - Discussed sleep study. She is not interested. - On home O2 - Declines 6MW today.  - She does not want to go to pulmonary rehab outpatient because she cannot drive and has a hard time getting in and out of the house. Will refer her for pulm rehab at home today 3. Chronic diastolic CHF/RV failure:  - NYHA III - Volume status stable.  - Continue Lasix 20 mg daily +  KCl 20 daily. - Consider adding spiro next visit 4. R groin pseudoaneurysm:  - R groin [place] 05/18/18 with 1.5 x 1.2 cm pseudoaneurysm from procedure 05/15/18.  - Pt underwent US-guided manual compression with closure of PSA by Dr. Scot Dock 05/20/18 - Right groin stable today.   Refer to pulmonary rehab at home She declines sleep study and 6MW today.  Follow up in 4 weeks with APP Follow up 2-3 months with Dr Aundra Dubin BMET, CBC today  Georgiana Shore NP 06/10/2018  Greater than 50% of the 25 minute visit was spent in counseling/coordination of care regarding disease state education, salt/fluid restriction, sliding scale diuretics, and medication compliance.

## 2018-06-10 ENCOUNTER — Ambulatory Visit (HOSPITAL_COMMUNITY)
Admission: RE | Admit: 2018-06-10 | Discharge: 2018-06-10 | Disposition: A | Discharge status: Home / Self Care | Payer: Medicare Other | Source: Ambulatory Visit | Attending: Internal Medicine | Admitting: Internal Medicine

## 2018-06-10 ENCOUNTER — Other Ambulatory Visit: Payer: Self-pay

## 2018-06-10 ENCOUNTER — Encounter (HOSPITAL_COMMUNITY): Payer: Self-pay

## 2018-06-10 VITALS — BP 136/82 | HR 84 | Wt 143.8 lb

## 2018-06-10 DIAGNOSIS — Z9981 Dependence on supplemental oxygen: Secondary | ICD-10-CM | POA: Diagnosis not present

## 2018-06-10 DIAGNOSIS — I25119 Atherosclerotic heart disease of native coronary artery with unspecified angina pectoris: Secondary | ICD-10-CM | POA: Diagnosis not present

## 2018-06-10 DIAGNOSIS — E119 Type 2 diabetes mellitus without complications: Secondary | ICD-10-CM | POA: Diagnosis not present

## 2018-06-10 DIAGNOSIS — Z7902 Long term (current) use of antithrombotics/antiplatelets: Secondary | ICD-10-CM | POA: Insufficient documentation

## 2018-06-10 DIAGNOSIS — Z87891 Personal history of nicotine dependence: Secondary | ICD-10-CM | POA: Insufficient documentation

## 2018-06-10 DIAGNOSIS — I729 Aneurysm of unspecified site: Secondary | ICD-10-CM | POA: Diagnosis not present

## 2018-06-10 DIAGNOSIS — I272 Pulmonary hypertension, unspecified: Secondary | ICD-10-CM | POA: Diagnosis not present

## 2018-06-10 DIAGNOSIS — K219 Gastro-esophageal reflux disease without esophagitis: Secondary | ICD-10-CM | POA: Insufficient documentation

## 2018-06-10 DIAGNOSIS — I110 Hypertensive heart disease with heart failure: Secondary | ICD-10-CM | POA: Diagnosis not present

## 2018-06-10 DIAGNOSIS — J439 Emphysema, unspecified: Secondary | ICD-10-CM | POA: Insufficient documentation

## 2018-06-10 DIAGNOSIS — Q2739 Arteriovenous malformation, other site: Secondary | ICD-10-CM | POA: Insufficient documentation

## 2018-06-10 DIAGNOSIS — Z85828 Personal history of other malignant neoplasm of skin: Secondary | ICD-10-CM | POA: Diagnosis not present

## 2018-06-10 DIAGNOSIS — E785 Hyperlipidemia, unspecified: Secondary | ICD-10-CM | POA: Insufficient documentation

## 2018-06-10 DIAGNOSIS — I2510 Atherosclerotic heart disease of native coronary artery without angina pectoris: Secondary | ICD-10-CM | POA: Diagnosis not present

## 2018-06-10 DIAGNOSIS — Z79899 Other long term (current) drug therapy: Secondary | ICD-10-CM | POA: Insufficient documentation

## 2018-06-10 DIAGNOSIS — T81718A Complication of other artery following a procedure, not elsewhere classified, initial encounter: Secondary | ICD-10-CM | POA: Diagnosis not present

## 2018-06-10 DIAGNOSIS — I5032 Chronic diastolic (congestive) heart failure: Secondary | ICD-10-CM | POA: Insufficient documentation

## 2018-06-10 DIAGNOSIS — Z7982 Long term (current) use of aspirin: Secondary | ICD-10-CM | POA: Diagnosis not present

## 2018-06-10 LAB — CBC
HEMATOCRIT: 37.3 % (ref 36.0–46.0)
Hemoglobin: 10.7 g/dL — ABNORMAL LOW (ref 12.0–15.0)
MCH: 25.8 pg — ABNORMAL LOW (ref 26.0–34.0)
MCHC: 28.7 g/dL — ABNORMAL LOW (ref 30.0–36.0)
MCV: 89.9 fL (ref 80.0–100.0)
NRBC: 0 % (ref 0.0–0.2)
Platelets: 244 10*3/uL (ref 150–400)
RBC: 4.15 MIL/uL (ref 3.87–5.11)
RDW: 19.1 % — AB (ref 11.5–15.5)
WBC: 6.9 10*3/uL (ref 4.0–10.5)

## 2018-06-10 LAB — BASIC METABOLIC PANEL
Anion gap: 6 (ref 5–15)
BUN: 15 mg/dL (ref 8–23)
CALCIUM: 9.3 mg/dL (ref 8.9–10.3)
CHLORIDE: 103 mmol/L (ref 98–111)
CO2: 30 mmol/L (ref 22–32)
CREATININE: 0.85 mg/dL (ref 0.44–1.00)
GFR calc Af Amer: >60 mL/min (ref >60)
GFR calc non Af Amer: >60 mL/min (ref >60)
Glucose, Bld: 142 mg/dL — ABNORMAL HIGH (ref 70–99)
Potassium: 4.8 mmol/L (ref 3.5–5.1)
SODIUM: 139 mmol/L (ref 135–145)

## 2018-06-10 NOTE — Patient Instructions (Signed)
An order has been placed for in home pulmonary rehab, they will reach out to you to begin services.   Labs today We will only contact you if something comes back abnormal or we need to make some changes. Otherwise no news is good news!   Your physician recommends that you schedule a follow-up appointment in: 4 weeks  in the Advanced Practitioners (PA/NP) Clinic  And 8 weeks with Dr Aundra Dubin  Do the following things EVERYDAY: 1) Weigh yourself in the morning before breakfast. Write it down and keep it in a log. 2) Take your medicines as prescribed 3) Eat low salt foods-Limit salt (sodium) to 2000 mg per day.  4) Stay as active as you can everyday 5) Limit all fluids for the day to less than 2 liters

## 2018-06-11 DIAGNOSIS — Z9981 Dependence on supplemental oxygen: Secondary | ICD-10-CM | POA: Diagnosis not present

## 2018-06-11 DIAGNOSIS — M1990 Unspecified osteoarthritis, unspecified site: Secondary | ICD-10-CM | POA: Diagnosis not present

## 2018-06-11 DIAGNOSIS — I272 Pulmonary hypertension, unspecified: Secondary | ICD-10-CM | POA: Diagnosis not present

## 2018-06-11 DIAGNOSIS — Z9181 History of falling: Secondary | ICD-10-CM | POA: Diagnosis not present

## 2018-06-11 DIAGNOSIS — Z79891 Long term (current) use of opiate analgesic: Secondary | ICD-10-CM | POA: Diagnosis not present

## 2018-06-11 DIAGNOSIS — I2510 Atherosclerotic heart disease of native coronary artery without angina pectoris: Secondary | ICD-10-CM | POA: Diagnosis not present

## 2018-06-11 DIAGNOSIS — Z8673 Personal history of transient ischemic attack (TIA), and cerebral infarction without residual deficits: Secondary | ICD-10-CM | POA: Diagnosis not present

## 2018-06-11 DIAGNOSIS — Z7982 Long term (current) use of aspirin: Secondary | ICD-10-CM | POA: Diagnosis not present

## 2018-06-11 DIAGNOSIS — Z85828 Personal history of other malignant neoplasm of skin: Secondary | ICD-10-CM | POA: Diagnosis not present

## 2018-06-11 DIAGNOSIS — I5032 Chronic diastolic (congestive) heart failure: Secondary | ICD-10-CM | POA: Diagnosis not present

## 2018-06-11 DIAGNOSIS — Z96653 Presence of artificial knee joint, bilateral: Secondary | ICD-10-CM | POA: Diagnosis not present

## 2018-06-11 DIAGNOSIS — I110 Hypertensive heart disease with heart failure: Secondary | ICD-10-CM | POA: Diagnosis not present

## 2018-06-11 DIAGNOSIS — K219 Gastro-esophageal reflux disease without esophagitis: Secondary | ICD-10-CM | POA: Diagnosis not present

## 2018-06-11 DIAGNOSIS — I724 Aneurysm of artery of lower extremity: Secondary | ICD-10-CM | POA: Diagnosis not present

## 2018-06-11 DIAGNOSIS — E119 Type 2 diabetes mellitus without complications: Secondary | ICD-10-CM | POA: Diagnosis not present

## 2018-06-11 DIAGNOSIS — Z7902 Long term (current) use of antithrombotics/antiplatelets: Secondary | ICD-10-CM | POA: Diagnosis not present

## 2018-06-11 NOTE — Addendum Note (Signed)
Encounter addended by: Kerry Dory, CMA on: 06/11/2018 10:57 AM  Actions taken: Order list changed, Diagnosis association updated

## 2018-06-15 DIAGNOSIS — R0902 Hypoxemia: Secondary | ICD-10-CM | POA: Diagnosis not present

## 2018-06-17 ENCOUNTER — Encounter: Payer: Self-pay | Admitting: Cardiovascular Disease

## 2018-06-17 ENCOUNTER — Ambulatory Visit: Payer: Medicare Other | Admitting: Cardiovascular Disease

## 2018-06-17 VITALS — BP 140/78 | HR 91 | Ht 62.0 in | Wt 145.2 lb

## 2018-06-17 DIAGNOSIS — I272 Pulmonary hypertension, unspecified: Secondary | ICD-10-CM | POA: Diagnosis not present

## 2018-06-17 DIAGNOSIS — E785 Hyperlipidemia, unspecified: Secondary | ICD-10-CM

## 2018-06-17 DIAGNOSIS — I25119 Atherosclerotic heart disease of native coronary artery with unspecified angina pectoris: Secondary | ICD-10-CM

## 2018-06-17 NOTE — Patient Instructions (Signed)
Medication Instructions:   If you need a refill on your cardiac medications before your next appointment, please call your pharmacy.   Lab work:  If you have labs (blood work) drawn today and your tests are completely normal, you will receive your results only by: Marland Kitchen MyChart Message (if you have MyChart) OR . A paper copy in the mail If you have any lab test that is abnormal or we need to change your treatment, we will call you to review the results.  Testing/Procedures: Your physician has requested that you have an echocardiogram in 6 months with office visit. Echocardiography is a painless test that uses sound waves to create images of your heart. It provides your doctor with information about the size and shape of your heart and how well your heart's chambers and valves are working. This procedure takes approximately one hour. There are no restrictions for this procedure.  Follow-Up: At [facility], you and your health needs are our priority.  As part of our continuing mission to provide you with exceptional heart care, we have created designated Provider Care Teams.  These Care Teams include your primary Cardiologist (physician) and Advanced Practice Providers (APPs -  Physician Assistants and Nurse Practitioners) who all work together to provide you with the care you need, when you need it. You will need a follow up appointment in 6 months.  Please call our office 2 months in advance to schedule this appointment.  You may see Jenkins Rouge, MD or one of the following Advanced Practice Providers on your designated Care Team:   Truitt Merle, NP Cecilie Kicks, NP . Kathyrn Drown, NP

## 2018-06-18 DIAGNOSIS — Z96653 Presence of artificial knee joint, bilateral: Secondary | ICD-10-CM | POA: Diagnosis not present

## 2018-06-18 DIAGNOSIS — Z8673 Personal history of transient ischemic attack (TIA), and cerebral infarction without residual deficits: Secondary | ICD-10-CM | POA: Diagnosis not present

## 2018-06-18 DIAGNOSIS — K219 Gastro-esophageal reflux disease without esophagitis: Secondary | ICD-10-CM | POA: Diagnosis not present

## 2018-06-18 DIAGNOSIS — Z7902 Long term (current) use of antithrombotics/antiplatelets: Secondary | ICD-10-CM | POA: Diagnosis not present

## 2018-06-18 DIAGNOSIS — Z9981 Dependence on supplemental oxygen: Secondary | ICD-10-CM | POA: Diagnosis not present

## 2018-06-18 DIAGNOSIS — I5032 Chronic diastolic (congestive) heart failure: Secondary | ICD-10-CM | POA: Diagnosis not present

## 2018-06-18 DIAGNOSIS — Z79891 Long term (current) use of opiate analgesic: Secondary | ICD-10-CM | POA: Diagnosis not present

## 2018-06-18 DIAGNOSIS — I110 Hypertensive heart disease with heart failure: Secondary | ICD-10-CM | POA: Diagnosis not present

## 2018-06-18 DIAGNOSIS — I724 Aneurysm of artery of lower extremity: Secondary | ICD-10-CM | POA: Diagnosis not present

## 2018-06-18 DIAGNOSIS — Z7982 Long term (current) use of aspirin: Secondary | ICD-10-CM | POA: Diagnosis not present

## 2018-06-18 DIAGNOSIS — Z85828 Personal history of other malignant neoplasm of skin: Secondary | ICD-10-CM | POA: Diagnosis not present

## 2018-06-18 DIAGNOSIS — E119 Type 2 diabetes mellitus without complications: Secondary | ICD-10-CM | POA: Diagnosis not present

## 2018-06-18 DIAGNOSIS — Z9181 History of falling: Secondary | ICD-10-CM | POA: Diagnosis not present

## 2018-06-18 DIAGNOSIS — I2510 Atherosclerotic heart disease of native coronary artery without angina pectoris: Secondary | ICD-10-CM | POA: Diagnosis not present

## 2018-06-18 DIAGNOSIS — I272 Pulmonary hypertension, unspecified: Secondary | ICD-10-CM | POA: Diagnosis not present

## 2018-06-18 DIAGNOSIS — M1990 Unspecified osteoarthritis, unspecified site: Secondary | ICD-10-CM | POA: Diagnosis not present

## 2018-06-19 ENCOUNTER — Encounter (HOSPITAL_COMMUNITY): Payer: Medicare Other | Admitting: Cardiology

## 2018-06-19 DIAGNOSIS — R768 Other specified abnormal immunological findings in serum: Secondary | ICD-10-CM | POA: Diagnosis not present

## 2018-06-19 DIAGNOSIS — E119 Type 2 diabetes mellitus without complications: Secondary | ICD-10-CM | POA: Diagnosis not present

## 2018-06-19 DIAGNOSIS — I10 Essential (primary) hypertension: Secondary | ICD-10-CM | POA: Diagnosis not present

## 2018-06-19 DIAGNOSIS — I272 Pulmonary hypertension, unspecified: Secondary | ICD-10-CM | POA: Diagnosis not present

## 2018-06-19 DIAGNOSIS — Z79899 Other long term (current) drug therapy: Secondary | ICD-10-CM | POA: Diagnosis not present

## 2018-06-19 DIAGNOSIS — I2510 Atherosclerotic heart disease of native coronary artery without angina pectoris: Secondary | ICD-10-CM | POA: Diagnosis not present

## 2018-07-08 ENCOUNTER — Encounter (HOSPITAL_COMMUNITY): Payer: Medicare Other

## 2018-07-15 DIAGNOSIS — R0902 Hypoxemia: Secondary | ICD-10-CM | POA: Diagnosis not present

## 2018-07-21 ENCOUNTER — Ambulatory Visit (HOSPITAL_COMMUNITY)
Admission: RE | Admit: 2018-07-21 | Discharge: 2018-07-21 | Disposition: A | Discharge status: Home / Self Care | Payer: Medicare Other | Source: Ambulatory Visit | Attending: Cardiology | Admitting: Cardiology

## 2018-07-21 ENCOUNTER — Other Ambulatory Visit: Payer: Self-pay

## 2018-07-21 ENCOUNTER — Encounter (HOSPITAL_COMMUNITY): Payer: Self-pay | Admitting: *Deleted

## 2018-07-21 ENCOUNTER — Encounter (HOSPITAL_COMMUNITY): Payer: Self-pay

## 2018-07-21 VITALS — BP 150/88 | HR 80 | Wt 144.6 lb

## 2018-07-21 DIAGNOSIS — Z7982 Long term (current) use of aspirin: Secondary | ICD-10-CM | POA: Insufficient documentation

## 2018-07-21 DIAGNOSIS — I2510 Atherosclerotic heart disease of native coronary artery without angina pectoris: Secondary | ICD-10-CM | POA: Diagnosis not present

## 2018-07-21 DIAGNOSIS — Z85828 Personal history of other malignant neoplasm of skin: Secondary | ICD-10-CM | POA: Insufficient documentation

## 2018-07-21 DIAGNOSIS — Z87891 Personal history of nicotine dependence: Secondary | ICD-10-CM | POA: Insufficient documentation

## 2018-07-21 DIAGNOSIS — E785 Hyperlipidemia, unspecified: Secondary | ICD-10-CM | POA: Diagnosis not present

## 2018-07-21 DIAGNOSIS — I272 Pulmonary hypertension, unspecified: Secondary | ICD-10-CM

## 2018-07-21 DIAGNOSIS — I5032 Chronic diastolic (congestive) heart failure: Secondary | ICD-10-CM

## 2018-07-21 DIAGNOSIS — K219 Gastro-esophageal reflux disease without esophagitis: Secondary | ICD-10-CM | POA: Insufficient documentation

## 2018-07-21 DIAGNOSIS — I110 Hypertensive heart disease with heart failure: Secondary | ICD-10-CM | POA: Diagnosis not present

## 2018-07-21 DIAGNOSIS — E119 Type 2 diabetes mellitus without complications: Secondary | ICD-10-CM | POA: Diagnosis not present

## 2018-07-21 DIAGNOSIS — Z7902 Long term (current) use of antithrombotics/antiplatelets: Secondary | ICD-10-CM | POA: Diagnosis not present

## 2018-07-21 DIAGNOSIS — I25119 Atherosclerotic heart disease of native coronary artery with unspecified angina pectoris: Secondary | ICD-10-CM

## 2018-07-21 DIAGNOSIS — Z79899 Other long term (current) drug therapy: Secondary | ICD-10-CM | POA: Insufficient documentation

## 2018-07-21 LAB — BASIC METABOLIC PANEL
Anion gap: 11 (ref 5–15)
BUN: 19 mg/dL (ref 8–23)
CHLORIDE: 106 mmol/L (ref 98–111)
CO2: 23 mmol/L (ref 22–32)
Calcium: 9.2 mg/dL (ref 8.9–10.3)
Creatinine, Ser: 0.78 mg/dL (ref 0.44–1.00)
GFR calc non Af Amer: >60 mL/min (ref >60)
Glucose, Bld: 131 mg/dL — ABNORMAL HIGH (ref 70–99)
POTASSIUM: 3.9 mmol/L (ref 3.5–5.1)
Sodium: 140 mmol/L (ref 135–145)

## 2018-07-21 MED ORDER — CARVEDILOL 6.25 MG PO TABS: 3.1250 mg | ORAL_TABLET | Freq: Two times a day (BID) | ORAL | 3 refills | Status: DC

## 2018-07-21 NOTE — Patient Instructions (Signed)
Labs done today  INCREASE Carvedilol 6.68m (1 tab) twice daily  Keep follow up appointment with Dr. MAundra Dubin

## 2018-07-21 NOTE — Progress Notes (Signed)
Advanced Heart Failure Clinic Note  PCP: [name] Referring: Dr. Johnsie Cancel HF Cardiology: Dr. Aundra Dubin  84 y.o. with history of DM, HTN, and prior osteomyelitis of jaw was referred by Dr. Johnsie Cancel for evaluation of pulmonary hypertension/RV failure.  She reports an episode of PNA in 3/19 and says that she never recovered from this.  She had gradually progressive exertional dyspnea to the point where she was short of breath walking around the house.  She had an echo in 7/19 that showed mildly decreased LV systolic function but severely decreased RV systolic function with dilated RV and at least moderate pulmonary hypertension by echo estimation.  She saw her PCP in 8/19 and was noted to be hypoxemic at rest.  He started her on home oxygen 2 L/[place] which she continues.  She felt a little better on oxygen and is now able to walk farther. She still gets short of breath walking more than about 50 yards.  No orthopnea/PND.  She had an episode of syncope about 1 month ago while standing on her deck on a hot day.  She says that she was unconscious for < 1 minute.  No palpitations/sensation of racing HR.  No chest pain.  No joint pain or rash.    She was referred to cardiology, saw Dr. Johnsie Cancel. Amlodipine was stopped and she was started empirically on sildenafil 20 mg tid for pulmonary hypertension.  She says she has not noted any change in her symptoms since beginning sildenafil.   Pt discharged 05/16/18 after a complex PCI requiring atherectomy of an LAD diagonal bifurcation lesion on 10/11. She was discharged on 10/12;her hemoglobin at that time was 9.9 (drop of 2 g overnight) and hematoma noted.  Presented to [facility]18[facility]18 with groin pain. R groin [place] 05/18/18 with 1.5 x 1.2 cm pseudoaneurysm.She had US-guided manual compression with closure of femoral artery PSA by Dr Scot Dock 10/16. Diuresed with IV lasix and transitioned to lasix 20 mg daily. Discharged with home O2 to use 3L at rest, up to 5L with  ambulation. DC weight: 145 lbs.  She returns today for HF follow up. Last visit referred to pulmonary rehab. Overall doing fine. SOB is at baseline. She remains SOB with some ADLs. Able to get around the house okay. Denies edema, orthopnea, or PND. No CP. She has occasional dizziness after exertion, about once/week. Taking all medications. Limits fluid and salt. She is not interested in going to pulmonary rehab or getting a sleep study. Weights 144-145 lbs at home.   ECG (personally reviewed): NSR, inferolateral TWIs  PMH: 1. H/o osteomyelitis of jaw.  2. Duodenal ulcers with upper GI bleeding. 3. Type 2 diabetes 4. Hyperlipidemia 5. Squamous cell carcinoma of scalp with radiation and surgery.  6. GERD 7. H/o back surgery  8. HTN 9. Pulmonary hypertension/RV failure: Echo (7/19) with EF 45-50%, moderate focal basal septal hypertrophy, mild diffuse hypokinesis, septal flattening, severe RV dilation with severely decreased systolic function, moderate TR, PASP 61 mmHg.   - CT chest (7/19): Mild emphysema, enlarged PA.  - V/Q scan (8/19): No evidence for chronic PE.   SH: Lives in [place] alone, daughter lives very close.  Quit smoking > 20 years ago. No ETOH.   FH: No history of pulmonary hypertension.  Mother with "heart problems."  Sister with PPM.    Review of systems complete and found to be negative unless listed in HPI.   Current Outpatient Medications  Medication Sig Dispense Refill  . aspirin EC 81 MG  tablet Take 1 tablet (81 mg total) by mouth daily. 30 tablet 11  . atorvastatin (LIPITOR) 80 MG tablet Take 1 tablet (80 mg total) by mouth daily. 30 tablet 6  . carvedilol (COREG) 3.125 MG tablet Take 1 tablet (3.125 mg total) by mouth 2 (two) times daily with a meal. 60 tablet 6  . clopidogrel (PLAVIX) 75 MG tablet Take 1 tablet (75 mg total) by mouth daily. 30 tablet 6  . furosemide (LASIX) 20 MG tablet Take 1 tablet (20 mg total) by mouth daily. 30 tablet 6  . macitentan  (OPSUMIT) 10 MG tablet Take 1 tablet (10 mg total) by mouth daily. Will need appointment with Dr. Aundra Dubin for further refills 30 tablet 1  . potassium chloride SA (K-DUR,KLOR-CON) 20 MEQ tablet Take 1 tablet (20 mEq total) by mouth daily. 30 tablet 6  . sildenafil (REVATIO) 20 MG tablet Take 1 tablet (20 mg total) by mouth 3 (three) times daily. 90 tablet 11  . ALPRAZolam (XANAX) 0.25 MG tablet Take 0.25 mg by mouth daily as needed for anxiety.     . chlorhexidine (PERIDEX) 0.12 % solution 15 mLs by Mouth Rinse route 2 (two) times daily as needed (sore gums).   4  . cyclobenzaprine (FLEXERIL) 10 MG tablet Take 1 tablet (10 mg total) by mouth 3 (three) times daily as needed for muscle spasms. (Patient taking differently: Take 10 mg by mouth 2 (two) times daily. ) 30 tablet 0  . gabapentin (NEURONTIN) 300 MG capsule Take 300 mg by mouth at bedtime.     . lactose free nutrition (BOOST) LIQD Take 237 mLs by mouth 2 (two) times daily between meals.     . mirtazapine (REMERON) 30 MG tablet Take 30 mg by mouth at bedtime.    . pantoprazole (PROTONIX) 40 MG tablet Take 1 tablet (40 mg total) by mouth 2 (two) times daily. (Patient taking differently: Take 40 mg by mouth daily. ) 120 tablet 0  . traMADol-acetaminophen (ULTRACET) 37.5-325 MG tablet Take 2 tablets by mouth 4 (four) times daily as needed for pain.  2   No current facility-administered medications for this encounter.    BP (!) 150/88   Pulse 80   Wt 65.6 kg (144 lb 9.6 oz)   SpO2 94%   PF (!) 4 L/min   BMI 26.45 kg/m   Wt Readings from Last 3 Encounters:  07/21/18 65.6 kg (144 lb 9.6 oz)  06/17/18 65.9 kg (145 lb 4 oz)  06/10/18 65.2 kg (143 lb 12.8 oz)   General: Arrived in wheelchair. No resp difficulty. HEENT: Normal Neck: Supple. JVP ~8. Carotids 2+ bilat; no bruits. No thyromegaly or nodule noted. Cor: PMI nondisplaced. RRR, No M/G/R noted Lungs: CTAB, normal effort. On 4 L [place].  Abdomen: Soft, non-tender, non-distended, no HSM.  No bruits or masses. +BS  Extremities: No cyanosis, clubbing, or rash. R and LLE no edema. Neuro: Alert & orientedx3, cranial nerves grossly intact. moves all 4 extremities w/o difficulty. Affect pleasant   1. CAD: Coronary angiography showed complex bifurcation lesion with 95% stenosis proximal LAD with calcification at take-off of large D2. The ostial/proximal D2 also had 95% calcified stenosis. Suspect this lesion played a role in her exertional dyspnea and also in her mildly decreased LV systolic function (EF 64-15% on echo). - S/p successful atherectomy with bifurcation stenting of proximal LAD and D2 05/15/18. No s/s ischemia. - Continue ASA 81, statin, and Plavix.  - Increase coreg to 6.25 mg BID 2.Pulmonary  hypertension: She appearedto have at least moderate pulmonary hypertension by echo with RV failure. CT chest showed mild emphysema, which should not explain her degree of RV failure. V/Q scan did not show evidence for chronic PEs.Anti-centromere antibody, ANA, and RF were all positive. ?CREST variant of scleroderma.She was empirically started on sildenafil 20 mg tidby Dr. Francella Solian much effect so far. She remains quite symptomatic and is on home oxygen.[place] 05/15/18 showed moderate PAH but very high PVR and low cardiac output. This is concerning for advanced pulmonary hypertension. She is on home oxygen.  - Continued on sildenafil20 mg TID.  - Continue Opsumit 10 mg daily.  Started in October. - Eventually plan selexipag   - Refuses sleep study. On home O2 - 6MW today: ambulated 79 meters, stopped at 3.5 minutes due to SOB. No desats.  - Refuses pulmonary rehab. Erasmo Downer, exercise physiologist. provided exercises for home 3. Chronic diastolic CHF/RV failure:  - NYHA III - Volume status stable on exam.  - Continue Lasix 20 mg daily + KCl 20 daily. 4. R groin pseudoaneurysm:  - R groin [place] 05/18/18 with 1.5 x 1.2 cm pseudoaneurysm from procedure 05/15/18.  - Pt  underwent US-guided manual compression with closure of PSA by Dr. Scot Dock 05/20/18 - Resolved.  BMET today. Increase coreg as above. Keep f/u with Dr Gwenith Daily Koren Bound NP 07/21/2018  Greater than 50% of the 25 minute visit was spent in counseling/coordination of care regarding disease state education, salt/fluid restriction, sliding scale diuretics, and medication compliance.

## 2018-07-21 NOTE — Progress Notes (Signed)
Patient referred to Clinical Exercise Physiologist by Lillia Mountain, NP for guidance and discussion about safe home exercises and/or starting an exercise program. Exercises were demonstrated and detailed with safety precautions to patient and accompanying caregiver (if present). Patient was presented with an information packet including demonstrations of the exercises discussed. All patient's questions were answered and patient was given contact information for further questions or concerns regarding their exercise.     Joy Martins, MS, ACSM-RCEP Clinical Exercise Physiologist

## 2018-07-21 NOTE — Progress Notes (Addendum)
6 min walk test lasted 3 minutes, 30 seconds. Pt ambulated 79.2 meters. Pt tolerated poorly and had to stop 3 times in order to catch her breath. Pt was on 4L of O2 sats ranging 90-93%. Hear rates ranging 102-115bpm.

## 2018-07-24 ENCOUNTER — Telehealth (HOSPITAL_COMMUNITY): Payer: Self-pay | Admitting: Cardiology

## 2018-07-24 NOTE — Telephone Encounter (Signed)
Can you please order selexipag for [name] and contact her to let her know? I spoke with Dr Aundra Dubin about her. Thank you! Holley Bouche   As requested Selexipag enrollment form completed, will need patient signature, Attempted to contact patient, no answer unable to leave message

## 2018-07-24 NOTE — Addendum Note (Signed)
Encounter addended by: Georgiana Shore, NP on: 07/24/2018 8:31 AM  Actions taken: Follow-up modified

## 2018-07-24 NOTE — Progress Notes (Signed)
Can you please order selexipag for [name] and contact her to let her know? I spoke with Dr Aundra Dubin about her. Thank you!

## 2018-07-24 NOTE — Telephone Encounter (Signed)
-----  Message from Georgiana Shore, NP sent at 07/24/2018  9:43 AM EST -----   ----- Message ----- From: Larey Dresser, MD Sent: 07/24/2018   9:36 AM EST To: Georgiana Shore, NP  Yes she should start selexipag.  ----- Message ----- From: Georgiana Shore, NP Sent: 07/24/2018   8:31 AM EST To: Larey Dresser, MD  Should I go ahead and get selexipag stared for her? I saw her 6MW after she had already left. She sees you 1/23.

## 2018-07-27 NOTE — Telephone Encounter (Signed)
Contacted patient to see if she would be in the area prior to her follow up to sign enrollment forms. Patient reports she would NOT, would like to discuss medication and 6MW results with Dr Aundra Dubin at follow up.

## 2018-08-12 ENCOUNTER — Telehealth: Payer: Self-pay

## 2018-08-12 NOTE — Telephone Encounter (Signed)
**Note De-Identified Santhosh Gulino Obfuscation** I have done a Sildenafil PA through covermymeds and received the following message: [name] Key: [id number] - PA Case ID: [id number] Need help? Call us at [phone]  Outcome  Approved today  Request Reference Number: [id number]. SILDENAFIL TAB 20MG is approved through 08/05/2019. For further questions, call [phone].  I have notified [employer] of this approval.

## 2018-08-12 NOTE — Telephone Encounter (Signed)
Approval letter received via fax from [place] on the pts entresto PA. Approval good from: Now-08/05/2019

## 2018-08-15 DIAGNOSIS — R0902 Hypoxemia: Secondary | ICD-10-CM | POA: Diagnosis not present

## 2018-08-27 ENCOUNTER — Encounter (HOSPITAL_COMMUNITY): Payer: Self-pay | Admitting: Cardiology

## 2018-08-27 ENCOUNTER — Ambulatory Visit (HOSPITAL_COMMUNITY)
Admission: RE | Admit: 2018-08-27 | Discharge: 2018-08-27 | Disposition: A | Discharge status: Home / Self Care | Payer: Medicare Other | Source: Ambulatory Visit | Attending: Cardiology | Admitting: Cardiology

## 2018-08-27 VITALS — BP 136/70 | HR 66 | Wt 144.6 lb

## 2018-08-27 DIAGNOSIS — Z923 Personal history of irradiation: Secondary | ICD-10-CM | POA: Insufficient documentation

## 2018-08-27 DIAGNOSIS — Z7902 Long term (current) use of antithrombotics/antiplatelets: Secondary | ICD-10-CM | POA: Insufficient documentation

## 2018-08-27 DIAGNOSIS — I25119 Atherosclerotic heart disease of native coronary artery with unspecified angina pectoris: Secondary | ICD-10-CM

## 2018-08-27 DIAGNOSIS — I2510 Atherosclerotic heart disease of native coronary artery without angina pectoris: Secondary | ICD-10-CM | POA: Diagnosis not present

## 2018-08-27 DIAGNOSIS — I272 Pulmonary hypertension, unspecified: Secondary | ICD-10-CM | POA: Diagnosis not present

## 2018-08-27 DIAGNOSIS — E119 Type 2 diabetes mellitus without complications: Secondary | ICD-10-CM | POA: Insufficient documentation

## 2018-08-27 DIAGNOSIS — J439 Emphysema, unspecified: Secondary | ICD-10-CM | POA: Diagnosis not present

## 2018-08-27 DIAGNOSIS — Z7982 Long term (current) use of aspirin: Secondary | ICD-10-CM | POA: Insufficient documentation

## 2018-08-27 DIAGNOSIS — I110 Hypertensive heart disease with heart failure: Secondary | ICD-10-CM | POA: Diagnosis not present

## 2018-08-27 DIAGNOSIS — Z87891 Personal history of nicotine dependence: Secondary | ICD-10-CM | POA: Insufficient documentation

## 2018-08-27 DIAGNOSIS — Z79899 Other long term (current) drug therapy: Secondary | ICD-10-CM | POA: Diagnosis not present

## 2018-08-27 DIAGNOSIS — K219 Gastro-esophageal reflux disease without esophagitis: Secondary | ICD-10-CM | POA: Diagnosis not present

## 2018-08-27 DIAGNOSIS — E785 Hyperlipidemia, unspecified: Secondary | ICD-10-CM | POA: Insufficient documentation

## 2018-08-27 DIAGNOSIS — Z9981 Dependence on supplemental oxygen: Secondary | ICD-10-CM | POA: Diagnosis not present

## 2018-08-27 DIAGNOSIS — Z955 Presence of coronary angioplasty implant and graft: Secondary | ICD-10-CM | POA: Insufficient documentation

## 2018-08-27 DIAGNOSIS — I5032 Chronic diastolic (congestive) heart failure: Secondary | ICD-10-CM | POA: Diagnosis not present

## 2018-08-27 DIAGNOSIS — Z85828 Personal history of other malignant neoplasm of skin: Secondary | ICD-10-CM | POA: Diagnosis not present

## 2018-08-27 LAB — CBC
HCT: 37 % (ref 36.0–46.0)
Hemoglobin: 10.5 g/dL — ABNORMAL LOW (ref 12.0–15.0)
MCH: 24.6 pg — ABNORMAL LOW (ref 26.0–34.0)
MCHC: 28.4 g/dL — ABNORMAL LOW (ref 30.0–36.0)
MCV: 86.7 fL (ref 80.0–100.0)
NRBC: 0 % (ref 0.0–0.2)
PLATELETS: 231 10*3/uL (ref 150–400)
RBC: 4.27 MIL/uL (ref 3.87–5.11)
RDW: 17.2 % — ABNORMAL HIGH (ref 11.5–15.5)
WBC: 7.7 10*3/uL (ref 4.0–10.5)

## 2018-08-27 LAB — LIPID PANEL
CHOL/HDL RATIO: 3.6 ratio
Cholesterol: 118 mg/dL (ref 0–200)
HDL: 33 mg/dL — AB (ref >40)
LDL Cholesterol: 49 mg/dL (ref 0–99)
TRIGLYCERIDES: 179 mg/dL — AB (ref <150)
VLDL: 36 mg/dL (ref 0–40)

## 2018-08-27 LAB — BASIC METABOLIC PANEL
ANION GAP: 10 (ref 5–15)
BUN: 24 mg/dL — ABNORMAL HIGH (ref 8–23)
CALCIUM: 9.3 mg/dL (ref 8.9–10.3)
CHLORIDE: 104 mmol/L (ref 98–111)
CO2: 24 mmol/L (ref 22–32)
Creatinine, Ser: 1.02 mg/dL — ABNORMAL HIGH (ref 0.44–1.00)
GFR calc non Af Amer: 50 mL/min — ABNORMAL LOW (ref >60)
GFR, EST AFRICAN AMERICAN: 58 mL/min — AB (ref >60)
Glucose, Bld: 153 mg/dL — ABNORMAL HIGH (ref 70–99)
Potassium: 4.2 mmol/L (ref 3.5–5.1)
Sodium: 138 mmol/L (ref 135–145)

## 2018-08-27 NOTE — Progress Notes (Signed)
Advanced Heart Failure Clinic Note  PCP: [name] Referring: Dr. Johnsie Cancel HF Cardiology: Dr. Aundra Dubin  85 y.o. with history of DM, HTN, and prior osteomyelitis of jaw was referred by Dr. Johnsie Cancel for evaluation of pulmonary hypertension/RV failure.  She reports an episode of PNA in 3/19 and says that she never recovered from this.  She had gradually progressive exertional dyspnea to the point where she was short of breath walking around the house.  She had an echo in 7/19 that showed mildly decreased LV systolic function but severely decreased RV systolic function with dilated RV and at least moderate pulmonary hypertension by echo estimation.  She saw her PCP in 8/19 and was noted to be hypoxemic at rest.  He started her on home oxygen 2 L/ which she continues.    She was referred to cardiology, saw Dr. Johnsie Cancel. Amlodipine was stopped and she was started empirically on sildenafil 20 mg tid for pulmonary hypertension.   She was referred to CHF clinic.    RHC/LHC was done in 10/19, showing complex severe bifurcation lesion involving the proximal LAD and large D2 ostium. She had complex PCI requiring atherectomy and DES to pLAD and ostial D2. RHC showed PAH with PVR 11.6 WU and CI 1.7.  She subsequently was found to have a right groin pseudoaneurysm from cath.   She was started on Opsumit.  Serologies have suggested CREST syndrome.  She missed appointment with Dr. Amil Amen.   She returns today for followup of pulmonary hypertension and CAD.  She feels like her breathing is better since starting Opsumit/sildenafil and PCI to LAD and D2.  Hard to know which helped the most.  She is still short of breath walking around the house but is able to get farther without stopping (about 100 feet). No chest pain.  Using home oxygen.  No lightheadedness or syncope. Weight is stable.   6 minute walk (12/19): 79 m 6 minute walk (1/20): 121 m  Labs (12/19): K 3.9, creatinine 0.78  PMH: 1. H/o osteomyelitis of  jaw.  2. Duodenal ulcers with upper GI bleeding. 3. Type 2 diabetes 4. Hyperlipidemia 5. Squamous cell carcinoma of scalp with radiation and surgery.  6. GERD 7. H/o back surgery  8. HTN 9. Pulmonary hypertension/RV failure: Echo (7/19) with EF 45-50%, moderate focal basal septal hypertrophy, mild diffuse hypokinesis, septal flattening, severe RV dilation with severely decreased systolic function, moderate TR, PASP 61 mmHg.   - CT chest (7/19): Mild emphysema, enlarged PA.  - V/Q scan (8/19): No evidence for chronic PE.  - anti-centromere Ab positive, ANA positive, RF 73 => suspect CREST syndrome.  - RHC (10/19): mean RA 7, PA 64/21 mean 38, mean PCWP 8, CI 1.7, PVR 11.6 WU.  10. CAD: LHC (10/19) with 95% proximal LAD and 95% ostial D2.  Complicated bifurcation intervention with atherectomy and DES to proximal LAD and ostial D2.   11. Post-cath right groin pseudoaneurysm in 10/19.  12. CREST syndrome suspected.   SH: Lives in [place] alone, daughter lives very close.  Quit smoking > 20 years ago. No ETOH.   FH: No history of pulmonary hypertension.  Mother with "heart problems."  Sister with PPM.    Review of systems complete and found to be negative unless listed in HPI.   Current Outpatient Medications  Medication Sig Dispense Refill  . ALPRAZolam (XANAX) 0.25 MG tablet Take 0.25 mg by mouth daily as needed for anxiety.     Marland Kitchen aspirin EC 81 MG tablet  Take 1 tablet (81 mg total) by mouth daily. 30 tablet 11  . atorvastatin (LIPITOR) 80 MG tablet Take 1 tablet (80 mg total) by mouth daily. 30 tablet 6  . carvedilol (COREG) 6.25 MG tablet Take 6.25 mg by mouth 2 (two) times daily with a meal.    . chlorhexidine (PERIDEX) 0.12 % solution 15 mLs by Mouth Rinse route 2 (two) times daily as needed (sore gums).   4  . clopidogrel (PLAVIX) 75 MG tablet Take 1 tablet (75 mg total) by mouth daily. 30 tablet 6  . cyclobenzaprine (FLEXERIL) 10 MG tablet Take 1 tablet (10 mg total) by mouth 3  (three) times daily as needed for muscle spasms. (Patient taking differently: Take 10 mg by mouth 2 (two) times daily. ) 30 tablet 0  . furosemide (LASIX) 20 MG tablet Take 1 tablet (20 mg total) by mouth daily. 30 tablet 6  . gabapentin (NEURONTIN) 300 MG capsule Take 300 mg by mouth at bedtime.     . lactose free nutrition (BOOST) LIQD Take 237 mLs by mouth 2 (two) times daily between meals.     . macitentan (OPSUMIT) 10 MG tablet Take 1 tablet (10 mg total) by mouth daily. Will need appointment with Dr. Aundra Dubin for further refills 30 tablet 1  . mirtazapine (REMERON) 30 MG tablet Take 30 mg by mouth at bedtime.    . pantoprazole (PROTONIX) 40 MG tablet Take 1 tablet (40 mg total) by mouth 2 (two) times daily. (Patient taking differently: Take 40 mg by mouth daily. ) 120 tablet 0  . potassium chloride SA (K-DUR,KLOR-CON) 20 MEQ tablet Take 1 tablet (20 mEq total) by mouth daily. 30 tablet 6  . sildenafil (REVATIO) 20 MG tablet Take 1 tablet (20 mg total) by mouth 3 (three) times daily. 90 tablet 11  . traMADol-acetaminophen (ULTRACET) 37.5-325 MG tablet Take 2 tablets by mouth 4 (four) times daily as needed for pain.  2  . Selexipag 200 & 800 MCG TBPK Take by mouth.     No current facility-administered medications for this encounter.    BP 136/70   Pulse 66   Wt 65.6 kg (144 lb 9.6 oz)   SpO2 96% Comment: 4 l  BMI 26.45 kg/m   Wt Readings from Last 3 Encounters:  08/27/18 65.6 kg (144 lb 9.6 oz)  07/21/18 65.6 kg (144 lb 9.6 oz)  06/17/18 65.9 kg (145 lb 4 oz)   General: NAD Neck: No JVD, no thyromegaly or thyroid nodule.  Lungs: Clear to auscultation bilaterally with normal respiratory effort. CV: Nondisplaced PMI.  Heart regular S1/S2, no S3/S4, no murmur.  No peripheral edema.  No carotid bruit.  Normal pedal pulses.  Abdomen: Soft, nontender, no hepatosplenomegaly, no distention.  Skin: Intact without lesions or rashes.  Neurologic: Alert and oriented x 3.  Psych: Normal  affect. Extremities: No clubbing or cyanosis.  HEENT: Normal.   Assessment/Plan: 1. CAD: Coronary angiography showed complex bifurcation lesion with 95% stenosis proximal LAD with calcification at take-off of large D2. The ostial/proximal D2 also had 95% calcified stenosis. Suspect this lesion played a role in her exertional dyspnea and also in her mildly decreased LV systolic function (EF 93-79% on echo).  S/p successful atherectomy with bifurcation stenting of proximal LAD and D2 05/15/18.  No chest pain.  - Continue ASA 81 and Plavix. - Continue atorvastatin, check lipids today.  2.Pulmonary hypertension: She appearedto have at least moderate pulmonary hypertension by echo in 7/19 with RV failure. CT  chest showed mild emphysema, which should not explain her degree of RV failure. V/Q scan did not show evidence for chronic PEs.Anti-centromere antibody, ANA, and RF were all positive. ?CREST variant of scleroderma.She remains quite symptomatic and is on home oxygen.[place] 05/15/18 showed moderate PAH but very high PVR and low cardiac output. This is concerning for advanced pulmonary hypertension.   - Continued on sildenafil20 mg TID.  - Continue Opsumit 10 mg daily.  - Start selexipag as next step.   - Refuses sleep study.  3. Chronic diastolic CHF/RV failure:  NYHA III.  She is not volume overloaded on exam.  - Continue Lasix 20 mg daily + KCl 20 daily.  BMET today.  4. Suspect CREST syndrome: She missed her appointment with rheumatology, Dr. Amil Amen.  I will refer her to him again.   Followup in 6 wks.   Loralie Champagne 08/27/2018

## 2018-08-27 NOTE — Patient Instructions (Signed)
Labs done today. We will call you for any abnormal lab work.  START Selexipag. They will contact you in order to get this medication started.  6 minute walk today.  You need to call Dr. Elpidio Galea in order to schedule an appointment.   Follow up with Dr. Aundra Dubin in 6 weeks

## 2018-08-27 NOTE — Progress Notes (Signed)
Pt attempted the 6 minute walk. She was able to ambulate 121.63mters in 4 minutes. She did not tolerate the walk well. She had to take 3 rest breaks. Her heart rate ranged from 100-125bpm and her oxygen saturation ranged from 85-92 on 4L of oxygen.

## 2018-08-31 ENCOUNTER — Other Ambulatory Visit (HOSPITAL_COMMUNITY): Payer: Self-pay

## 2018-08-31 MED ORDER — CARVEDILOL 6.25 MG PO TABS: 6.2500 mg | ORAL_TABLET | Freq: Two times a day (BID) | ORAL | 3 refills | Status: DC

## 2018-09-08 DIAGNOSIS — H16142 Punctate keratitis, left eye: Secondary | ICD-10-CM | POA: Diagnosis not present

## 2018-09-08 DIAGNOSIS — H26493 Other secondary cataract, bilateral: Secondary | ICD-10-CM | POA: Diagnosis not present

## 2018-09-08 DIAGNOSIS — H524 Presbyopia: Secondary | ICD-10-CM | POA: Diagnosis not present

## 2018-09-08 DIAGNOSIS — H353132 Nonexudative age-related macular degeneration, bilateral, intermediate dry stage: Secondary | ICD-10-CM | POA: Diagnosis not present

## 2018-09-15 DIAGNOSIS — R0902 Hypoxemia: Secondary | ICD-10-CM | POA: Diagnosis not present

## 2018-09-18 DIAGNOSIS — E119 Type 2 diabetes mellitus without complications: Secondary | ICD-10-CM | POA: Diagnosis not present

## 2018-09-18 DIAGNOSIS — J9611 Chronic respiratory failure with hypoxia: Secondary | ICD-10-CM | POA: Diagnosis not present

## 2018-09-18 DIAGNOSIS — I272 Pulmonary hypertension, unspecified: Secondary | ICD-10-CM | POA: Diagnosis not present

## 2018-09-18 DIAGNOSIS — I2510 Atherosclerotic heart disease of native coronary artery without angina pectoris: Secondary | ICD-10-CM | POA: Diagnosis not present

## 2018-09-18 DIAGNOSIS — I50810 Right heart failure, unspecified: Secondary | ICD-10-CM | POA: Diagnosis not present

## 2018-09-23 ENCOUNTER — Telehealth (HOSPITAL_COMMUNITY): Payer: Self-pay

## 2018-09-23 NOTE — Telephone Encounter (Signed)
Prior authorization through [employer] was initiated for Uptravi medication and sent via [place] on 09/23/2018.

## 2018-09-24 NOTE — Telephone Encounter (Signed)
Prior authorization through [employer] company was APPROVED for [name] and will expire on 08/05/2019.

## 2018-10-06 ENCOUNTER — Inpatient Hospital Stay (HOSPITAL_COMMUNITY): Payer: Medicare Other

## 2018-10-06 ENCOUNTER — Encounter (HOSPITAL_COMMUNITY)
Admission: EM | Disposition: A | Discharge status: Home / Self Care | Payer: Self-pay | Source: Home / Self Care | Attending: Family Medicine

## 2018-10-06 ENCOUNTER — Emergency Department (HOSPITAL_COMMUNITY): Payer: Medicare Other

## 2018-10-06 ENCOUNTER — Encounter (HOSPITAL_COMMUNITY): Payer: Self-pay

## 2018-10-06 ENCOUNTER — Inpatient Hospital Stay (HOSPITAL_COMMUNITY)
Admission: EM | Admit: 2018-10-06 | Discharge: 2018-10-10 | DRG: 286 | Disposition: A | Discharge status: Home / Self Care | Payer: Medicare Other | Attending: Family Medicine | Admitting: Family Medicine

## 2018-10-06 ENCOUNTER — Other Ambulatory Visit: Payer: Self-pay

## 2018-10-06 DIAGNOSIS — I25110 Atherosclerotic heart disease of native coronary artery with unstable angina pectoris: Secondary | ICD-10-CM | POA: Diagnosis not present

## 2018-10-06 DIAGNOSIS — E1122 Type 2 diabetes mellitus with diabetic chronic kidney disease: Secondary | ICD-10-CM | POA: Diagnosis present

## 2018-10-06 DIAGNOSIS — Z7902 Long term (current) use of antithrombotics/antiplatelets: Secondary | ICD-10-CM

## 2018-10-06 DIAGNOSIS — Z87891 Personal history of nicotine dependence: Secondary | ICD-10-CM

## 2018-10-06 DIAGNOSIS — R0981 Nasal congestion: Secondary | ICD-10-CM | POA: Diagnosis not present

## 2018-10-06 DIAGNOSIS — I361 Nonrheumatic tricuspid (valve) insufficiency: Secondary | ICD-10-CM | POA: Diagnosis not present

## 2018-10-06 DIAGNOSIS — G8929 Other chronic pain: Secondary | ICD-10-CM | POA: Diagnosis present

## 2018-10-06 DIAGNOSIS — I5033 Acute on chronic diastolic (congestive) heart failure: Secondary | ICD-10-CM | POA: Diagnosis present

## 2018-10-06 DIAGNOSIS — Z66 Do not resuscitate: Secondary | ICD-10-CM | POA: Diagnosis present

## 2018-10-06 DIAGNOSIS — I493 Ventricular premature depolarization: Secondary | ICD-10-CM | POA: Diagnosis not present

## 2018-10-06 DIAGNOSIS — I25119 Atherosclerotic heart disease of native coronary artery with unspecified angina pectoris: Secondary | ICD-10-CM | POA: Diagnosis present

## 2018-10-06 DIAGNOSIS — D72829 Elevated white blood cell count, unspecified: Secondary | ICD-10-CM | POA: Diagnosis present

## 2018-10-06 DIAGNOSIS — Z9861 Coronary angioplasty status: Secondary | ICD-10-CM

## 2018-10-06 DIAGNOSIS — J439 Emphysema, unspecified: Secondary | ICD-10-CM | POA: Diagnosis present

## 2018-10-06 DIAGNOSIS — I130 Hypertensive heart and chronic kidney disease with heart failure and stage 1 through stage 4 chronic kidney disease, or unspecified chronic kidney disease: Secondary | ICD-10-CM | POA: Diagnosis not present

## 2018-10-06 DIAGNOSIS — R509 Fever, unspecified: Secondary | ICD-10-CM

## 2018-10-06 DIAGNOSIS — I272 Pulmonary hypertension, unspecified: Secondary | ICD-10-CM | POA: Diagnosis not present

## 2018-10-06 DIAGNOSIS — I2510 Atherosclerotic heart disease of native coronary artery without angina pectoris: Secondary | ICD-10-CM | POA: Diagnosis not present

## 2018-10-06 DIAGNOSIS — E785 Hyperlipidemia, unspecified: Secondary | ICD-10-CM | POA: Diagnosis not present

## 2018-10-06 DIAGNOSIS — I371 Nonrheumatic pulmonary valve insufficiency: Secondary | ICD-10-CM | POA: Diagnosis not present

## 2018-10-06 DIAGNOSIS — Z7982 Long term (current) use of aspirin: Secondary | ICD-10-CM

## 2018-10-06 DIAGNOSIS — E876 Hypokalemia: Secondary | ICD-10-CM | POA: Diagnosis not present

## 2018-10-06 DIAGNOSIS — Z955 Presence of coronary angioplasty implant and graft: Secondary | ICD-10-CM

## 2018-10-06 DIAGNOSIS — K219 Gastro-esophageal reflux disease without esophagitis: Secondary | ICD-10-CM | POA: Diagnosis present

## 2018-10-06 DIAGNOSIS — I2721 Secondary pulmonary arterial hypertension: Secondary | ICD-10-CM | POA: Diagnosis present

## 2018-10-06 DIAGNOSIS — I724 Aneurysm of artery of lower extremity: Secondary | ICD-10-CM | POA: Diagnosis present

## 2018-10-06 DIAGNOSIS — I4510 Unspecified right bundle-branch block: Secondary | ICD-10-CM | POA: Diagnosis present

## 2018-10-06 DIAGNOSIS — N179 Acute kidney failure, unspecified: Secondary | ICD-10-CM | POA: Diagnosis present

## 2018-10-06 DIAGNOSIS — I491 Atrial premature depolarization: Secondary | ICD-10-CM | POA: Diagnosis not present

## 2018-10-06 DIAGNOSIS — D649 Anemia, unspecified: Secondary | ICD-10-CM | POA: Diagnosis present

## 2018-10-06 DIAGNOSIS — M545 Low back pain: Secondary | ICD-10-CM | POA: Diagnosis not present

## 2018-10-06 DIAGNOSIS — Z882 Allergy status to sulfonamides status: Secondary | ICD-10-CM

## 2018-10-06 DIAGNOSIS — I200 Unstable angina: Secondary | ICD-10-CM

## 2018-10-06 DIAGNOSIS — R079 Chest pain, unspecified: Secondary | ICD-10-CM | POA: Diagnosis present

## 2018-10-06 DIAGNOSIS — N183 Chronic kidney disease, stage 3 unspecified: Secondary | ICD-10-CM | POA: Diagnosis present

## 2018-10-06 DIAGNOSIS — I249 Acute ischemic heart disease, unspecified: Secondary | ICD-10-CM | POA: Diagnosis not present

## 2018-10-06 DIAGNOSIS — H3530 Unspecified macular degeneration: Secondary | ICD-10-CM | POA: Diagnosis present

## 2018-10-06 DIAGNOSIS — IMO0002 Reserved for concepts with insufficient information to code with codable children: Secondary | ICD-10-CM | POA: Diagnosis present

## 2018-10-06 DIAGNOSIS — F419 Anxiety disorder, unspecified: Secondary | ICD-10-CM | POA: Diagnosis present

## 2018-10-06 DIAGNOSIS — Z8673 Personal history of transient ischemic attack (TIA), and cerebral infarction without residual deficits: Secondary | ICD-10-CM

## 2018-10-06 DIAGNOSIS — E1165 Type 2 diabetes mellitus with hyperglycemia: Secondary | ICD-10-CM | POA: Diagnosis present

## 2018-10-06 DIAGNOSIS — R0902 Hypoxemia: Secondary | ICD-10-CM | POA: Diagnosis not present

## 2018-10-06 DIAGNOSIS — I10 Essential (primary) hypertension: Secondary | ICD-10-CM | POA: Diagnosis not present

## 2018-10-06 DIAGNOSIS — Z79899 Other long term (current) drug therapy: Secondary | ICD-10-CM

## 2018-10-06 DIAGNOSIS — Z79891 Long term (current) use of opiate analgesic: Secondary | ICD-10-CM

## 2018-10-06 DIAGNOSIS — T81718A Complication of other artery following a procedure, not elsewhere classified, initial encounter: Secondary | ICD-10-CM

## 2018-10-06 LAB — COMPREHENSIVE METABOLIC PANEL
ALT: 14 U/L (ref 0–44)
AST: 18 U/L (ref 15–41)
Albumin: 3.3 g/dL — ABNORMAL LOW (ref 3.5–5.0)
Alkaline Phosphatase: 55 U/L (ref 38–126)
Anion gap: 10 (ref 5–15)
BUN: 19 mg/dL (ref 8–23)
CO2: 24 mmol/L (ref 22–32)
Calcium: 9.4 mg/dL (ref 8.9–10.3)
Chloride: 104 mmol/L (ref 98–111)
Creatinine, Ser: 0.84 mg/dL (ref 0.44–1.00)
GFR calc Af Amer: >60 mL/min (ref >60)
GFR calc non Af Amer: >60 mL/min (ref >60)
Glucose, Bld: 198 mg/dL — ABNORMAL HIGH (ref 70–99)
Potassium: 3.8 mmol/L (ref 3.5–5.1)
Sodium: 138 mmol/L (ref 135–145)
Total Bilirubin: 0.4 mg/dL (ref 0.3–1.2)
Total Protein: 7.4 g/dL (ref 6.5–8.1)

## 2018-10-06 LAB — RESPIRATORY PANEL BY PCR
Adenovirus: NOT DETECTED
BORDETELLA PERTUSSIS-RVPCR: NOT DETECTED
Chlamydophila pneumoniae: NOT DETECTED
Coronavirus 229E: NOT DETECTED
Coronavirus HKU1: NOT DETECTED
Coronavirus NL63: NOT DETECTED
Coronavirus OC43: NOT DETECTED
INFLUENZA A-RVPPCR: NOT DETECTED
INFLUENZA B-RVPPCR: NOT DETECTED
Metapneumovirus: NOT DETECTED
Mycoplasma pneumoniae: NOT DETECTED
Parainfluenza Virus 1: NOT DETECTED
Parainfluenza Virus 2: NOT DETECTED
Parainfluenza Virus 3: NOT DETECTED
Parainfluenza Virus 4: NOT DETECTED
RESPIRATORY SYNCYTIAL VIRUS-RVPPCR: NOT DETECTED
Rhinovirus / Enterovirus: NOT DETECTED

## 2018-10-06 LAB — CBC
HCT: 35.4 % — ABNORMAL LOW (ref 36.0–46.0)
Hemoglobin: 10 g/dL — ABNORMAL LOW (ref 12.0–15.0)
MCH: 24.5 pg — ABNORMAL LOW (ref 26.0–34.0)
MCHC: 28.2 g/dL — ABNORMAL LOW (ref 30.0–36.0)
MCV: 86.8 fL (ref 80.0–100.0)
Platelets: 253 10*3/uL (ref 150–400)
RBC: 4.08 MIL/uL (ref 3.87–5.11)
RDW: 17.7 % — ABNORMAL HIGH (ref 11.5–15.5)
WBC: 11.4 10*3/uL — ABNORMAL HIGH (ref 4.0–10.5)
nRBC: 0 % (ref 0.0–0.2)

## 2018-10-06 LAB — INFLUENZA PANEL BY PCR (TYPE A & B)
Influenza A By PCR: NEGATIVE
Influenza B By PCR: NEGATIVE

## 2018-10-06 LAB — URINALYSIS, ROUTINE W REFLEX MICROSCOPIC
Bilirubin Urine: NEGATIVE
Glucose, UA: NEGATIVE mg/dL
Hgb urine dipstick: NEGATIVE
Ketones, ur: NEGATIVE mg/dL
Leukocytes,Ua: NEGATIVE
Nitrite: NEGATIVE
Protein, ur: NEGATIVE mg/dL
Specific Gravity, Urine: 1.013 (ref 1.005–1.030)
pH: 7 (ref 5.0–8.0)

## 2018-10-06 LAB — TROPONIN I
Troponin I: 0.04 ng/mL (ref <0.03)
Troponin I: 0.04 ng/mL (ref <0.03)
Troponin I: 0.05 ng/mL (ref <0.03)

## 2018-10-06 LAB — ECHOCARDIOGRAM COMPLETE
Height: 62 in
Weight: 2384 oz

## 2018-10-06 LAB — HEPARIN LEVEL (UNFRACTIONATED): Heparin Unfractionated: 0.16 IU/mL — ABNORMAL LOW (ref 0.30–0.70)

## 2018-10-06 LAB — LACTIC ACID, PLASMA: Lactic Acid, Venous: 1.4 mmol/L (ref 0.5–1.9)

## 2018-10-06 SURGERY — LEFT HEART CATH AND CORONARY ANGIOGRAPHY
Anesthesia: LOCAL

## 2018-10-06 MED ORDER — ALUM & MAG HYDROXIDE-SIMETH 200-200-20 MG/5ML PO SUSP
30.0000 mL | Freq: Once | ORAL | Status: AC
  Administered 2018-10-06: 30 mL via ORAL
  Filled 2018-10-06: qty 30

## 2018-10-06 MED ORDER — HEPARIN BOLUS VIA INFUSION
3800.0000 [IU] | Freq: Once | INTRAVENOUS | Status: AC
  Administered 2018-10-06: 3800 [IU] via INTRAVENOUS
  Filled 2018-10-06: qty 3800

## 2018-10-06 MED ORDER — SODIUM CHLORIDE 0.9% FLUSH
3.0000 mL | Freq: Two times a day (BID) | INTRAVENOUS | Status: DC
  Administered 2018-10-06 – 2018-10-09 (×5): 3 mL via INTRAVENOUS

## 2018-10-06 MED ORDER — ONDANSETRON HCL 4 MG/2ML IJ SOLN: 4.0000 mg | Freq: Four times a day (QID) | INTRAMUSCULAR | Status: DC | PRN

## 2018-10-06 MED ORDER — SODIUM CHLORIDE 0.9 % IV SOLN
2.0000 g | Freq: Once | INTRAVENOUS | Status: AC
  Administered 2018-10-06: 2 g via INTRAVENOUS
  Filled 2018-10-06: qty 2

## 2018-10-06 MED ORDER — HEPARIN BOLUS VIA INFUSION
2000.0000 [IU] | Freq: Once | INTRAVENOUS | Status: AC
  Administered 2018-10-06: 2000 [IU] via INTRAVENOUS
  Filled 2018-10-06: qty 2000

## 2018-10-06 MED ORDER — CARVEDILOL 6.25 MG PO TABS
6.2500 mg | ORAL_TABLET | Freq: Two times a day (BID) | ORAL | Status: DC
  Administered 2018-10-07 – 2018-10-08 (×3): 6.25 mg via ORAL
  Filled 2018-10-06 (×3): qty 1

## 2018-10-06 MED ORDER — SODIUM CHLORIDE 0.9 % IV SOLN: 250.0000 mL | INTRAVENOUS | Status: DC | PRN

## 2018-10-06 MED ORDER — MORPHINE SULFATE (PF) 4 MG/ML IV SOLN
4.0000 mg | Freq: Once | INTRAVENOUS | Status: AC
  Administered 2018-10-06: 4 mg via INTRAVENOUS
  Filled 2018-10-06: qty 1

## 2018-10-06 MED ORDER — ACETAMINOPHEN 325 MG PO TABS
650.0000 mg | ORAL_TABLET | ORAL | Status: DC | PRN
  Administered 2018-10-06: 650 mg via ORAL
  Filled 2018-10-06: qty 2

## 2018-10-06 MED ORDER — ATORVASTATIN CALCIUM 80 MG PO TABS
80.0000 mg | ORAL_TABLET | Freq: Every day | ORAL | Status: DC
  Administered 2018-10-06 – 2018-10-10 (×5): 80 mg via ORAL
  Filled 2018-10-06 (×5): qty 1

## 2018-10-06 MED ORDER — MACITENTAN 10 MG PO TABS
10.0000 mg | ORAL_TABLET | Freq: Every day | ORAL | Status: DC
  Administered 2018-10-06 – 2018-10-10 (×5): 10 mg via ORAL
  Filled 2018-10-06 (×5): qty 1

## 2018-10-06 MED ORDER — HEPARIN (PORCINE) 25000 UT/250ML-% IV SOLN
1000.0000 [IU]/h | INTRAVENOUS | Status: DC
  Administered 2018-10-06: 800 [IU]/h via INTRAVENOUS
  Administered 2018-10-07 – 2018-10-09 (×5): 1000 [IU]/h via INTRAVENOUS
  Filled 2018-10-06 (×4): qty 250

## 2018-10-06 MED ORDER — PANTOPRAZOLE SODIUM 40 MG PO TBEC
40.0000 mg | DELAYED_RELEASE_TABLET | Freq: Every day | ORAL | Status: DC
  Administered 2018-10-06 – 2018-10-10 (×5): 40 mg via ORAL
  Filled 2018-10-06 (×5): qty 1

## 2018-10-06 MED ORDER — SODIUM CHLORIDE 0.9 % WEIGHT BASED INFUSION
3.0000 mL/kg/h | INTRAVENOUS | Status: AC
  Administered 2018-10-06: 3 mL/kg/h via INTRAVENOUS

## 2018-10-06 MED ORDER — FUROSEMIDE 20 MG PO TABS
20.0000 mg | ORAL_TABLET | Freq: Every day | ORAL | Status: DC
  Administered 2018-10-07: 20 mg via ORAL
  Filled 2018-10-06: qty 1

## 2018-10-06 MED ORDER — NITROGLYCERIN 0.4 MG SL SUBL: 0.4000 mg | SUBLINGUAL_TABLET | SUBLINGUAL | Status: DC | PRN

## 2018-10-06 MED ORDER — ASPIRIN 300 MG RE SUPP: 300.0000 mg | RECTAL | Status: AC

## 2018-10-06 MED ORDER — VANCOMYCIN HCL 500 MG IV SOLR
500.0000 mg | INTRAVENOUS | Status: AC
  Administered 2018-10-06: 500 mg via INTRAVENOUS
  Filled 2018-10-06: qty 500

## 2018-10-06 MED ORDER — GABAPENTIN 300 MG PO CAPS
300.0000 mg | ORAL_CAPSULE | Freq: Every day | ORAL | Status: DC
  Administered 2018-10-06 – 2018-10-09 (×4): 300 mg via ORAL
  Filled 2018-10-06 (×4): qty 1

## 2018-10-06 MED ORDER — MORPHINE SULFATE (PF) 2 MG/ML IV SOLN
2.0000 mg | INTRAVENOUS | Status: DC | PRN
  Administered 2018-10-06 – 2018-10-09 (×7): 2 mg via INTRAVENOUS
  Filled 2018-10-06 (×7): qty 1

## 2018-10-06 MED ORDER — VANCOMYCIN HCL IN DEXTROSE 1-5 GM/200ML-% IV SOLN
1000.0000 mg | Freq: Once | INTRAVENOUS | Status: DC
  Administered 2018-10-06: 1000 mg via INTRAVENOUS
  Filled 2018-10-06: qty 200

## 2018-10-06 MED ORDER — SILDENAFIL CITRATE 20 MG PO TABS
20.0000 mg | ORAL_TABLET | Freq: Three times a day (TID) | ORAL | Status: DC
  Administered 2018-10-06 – 2018-10-10 (×11): 20 mg via ORAL
  Filled 2018-10-06 (×11): qty 1

## 2018-10-06 MED ORDER — SODIUM CHLORIDE 0.9 % WEIGHT BASED INFUSION: 1.0000 mL/kg/h | INTRAVENOUS | Status: DC

## 2018-10-06 MED ORDER — ASPIRIN EC 81 MG PO TBEC
81.0000 mg | DELAYED_RELEASE_TABLET | Freq: Every day | ORAL | Status: DC
  Administered 2018-10-07 – 2018-10-10 (×4): 81 mg via ORAL
  Filled 2018-10-06 (×4): qty 1

## 2018-10-06 MED ORDER — MIRTAZAPINE 7.5 MG PO TABS
30.0000 mg | ORAL_TABLET | Freq: Every day | ORAL | Status: DC
  Administered 2018-10-06 – 2018-10-09 (×4): 30 mg via ORAL
  Filled 2018-10-06 (×4): qty 4

## 2018-10-06 MED ORDER — VANCOMYCIN HCL 10 G IV SOLR: 1250.0000 mg | INTRAVENOUS | Status: DC

## 2018-10-06 MED ORDER — ASPIRIN 81 MG PO CHEW: 324.0000 mg | CHEWABLE_TABLET | ORAL | Status: AC

## 2018-10-06 MED ORDER — SODIUM CHLORIDE 0.9% FLUSH: 3.0000 mL | INTRAVENOUS | Status: DC | PRN

## 2018-10-06 MED ORDER — CLOPIDOGREL BISULFATE 75 MG PO TABS
75.0000 mg | ORAL_TABLET | Freq: Every day | ORAL | Status: DC
  Administered 2018-10-06 – 2018-10-10 (×5): 75 mg via ORAL
  Filled 2018-10-06 (×6): qty 1

## 2018-10-06 MED ORDER — VANCOMYCIN HCL 10 G IV SOLR
1250.0000 mg | Freq: Once | INTRAVENOUS | Status: DC
  Filled 2018-10-06: qty 1250

## 2018-10-06 MED ORDER — SODIUM CHLORIDE 0.9 % IV SOLN
2.0000 g | INTRAVENOUS | Status: DC
  Filled 2018-10-06: qty 2

## 2018-10-06 NOTE — H&P (Deleted)
Cardiology Admission History and Physical:   Patient ID: [name] MRN: [id number]; DOB: 1931-10-20   Admission date: 10/06/2018  Primary Care Provider: [name], PA-C Primary Cardiologist: Jenkins Rouge, MD  Primary Electrophysiologist:  None   Chief Complaint:  Chest pain  Patient Profile:   [name] is a 85 y.o. female with PMH of CAD s/p bifurcation stenting, DM, HL, HTN, GERD, Stroke, pulmonary HTN on O2 therapy who presented with chest pain.   History of Present Illness:   [name] is a 85 yo female with PMH noted above. She is followed by Dr. Aundra Dubin for her pulmonary HTN. She recently under cath back in 10/19 with calcified lesion in the LAD/Diag with bifurcation stenting of the LAD/Diag with double kiss technique. Placed on DAPT with ASA/plavix post cath. She never had chest pain with her presentation only shortness of breath. Last echo in 7/19 with EF of 45-50%.   Presents back to the ED this morning with chest pain. Reports she was awoken from sleep around 630am with sharp/pressure like chest pain in the center of her chest. Rated an 8/10. Called EMS and took 33m ASA. Symptoms persisted until she got to the ED. Given IV morphine with improvement. Reports being compliant with medications.   In the ED her labs showed stable electrolytes, Hgb 10, Trop 0.04. EKG with SR, RBBB and Bigeminy. She still with active chest pain at the time of exam in the ED.    Past Medical History:  Diagnosis Date  . Anxiety   . Arthritis    osteo; "mostly hands, knees, probably back too" (12/09/2016)  . Chronic lower back pain   . Complication of anesthesia 2011   resp distress -on vent after surgery  . Coronary artery disease   . Depression   . [place]   . Esophageal stricture   . GERD (gastroesophageal reflux disease)   . Headache    out grew them  . High cholesterol   . History of blood transfusion 2011; ?03/2012   "related to ORs" (12/09/2016)  . Hypertension   .  [place]testinal obstruction ([place]   . Macular degeneration of left eye    dx over 55 yrs ago.....hasn't changed much  . Shingles   . Squamous carcinoma 2013   squamas cell on scalp--took 14 radiation tx--2013  . Stroke ([facility]   Mini stroke;  No lasting deficits  . Type II diabetes mellitus ([place] dx'd 11/2016    Past Surgical History:  Procedure Laterality Date  . ABDOMINAL HYSTERECTOMY    . APPENDECTOMY    . BACK SURGERY    . CATARACT EXTRACTION, BILATERAL Bilateral   . CHOLECYSTECTOMY    . COLON SURGERY    . COLONOSCOPY    . CORONARY ATHERECTOMY N/A 05/15/2018   Procedure: CORONARY ATHERECTOMY;  Surgeon: HLeonie Man MD;  [facility];  Service: Cardiovascular;  Laterality: N/A;  . CORONARY STENT INTERVENTION N/A 05/15/2018   Procedure: CORONARY STENT INTERVENTION;  Surgeon: HLeonie Man MD;  [facility];  Service: Cardiovascular;  Laterality: N/A;  . ESOPHAGOGASTRODUODENOSCOPY (EGD) WITH ESOPHAGEAL DILATION  "several times"  . ESOPHAGOGASTRODUODENOSCOPY (EGD) WITH PROPOFOL N/A 12/31/2016   Procedure: ESOPHAGOGASTRODUODENOSCOPY (EGD) WITH PROPOFOL;  Surgeon: SWilford Corner MD;  [place]  Service: Endoscopy;  Laterality: N/A;  . EYE SURGERY    . FRACTURE SURGERY    . IR FLUORO GUIDE CV LINE RIGHT  01/02/2017  . IR [place]  ACCESS RIGHT  01/02/2017  . JOINT REPLACEMENT    . LUMBAR DISC SURGERY  10/25/2014   Right L4-L5 removal of seven free fragments of disk mostly posterolaterally.  Lysis of adhesion.  Microscope/notes 10/26/2014  . LUMBAR LAMINECTOMY/DECOMPRESSION MICRODISCECTOMY Right 08/09/2014   Procedure: Right Lumbar Four to Five Microdiskectomy;  Surgeon: Floyce Stakes, MD;  Location: [facility];  Service: Neurosurgery;  Laterality: Right;  Right L4-5 Microdiskectomy  . ORIF PERIPROSTHETIC FRACTURE  03/31/2012   Procedure: OPEN REDUCTION INTERNAL FIXATION (ORIF) PERIPROSTHETIC FRACTURE;  Surgeon: Mauri Pole, MD;  Location: [facility];  Service: Orthopedics;  Laterality: Left;  Open reduction internal fixation Left distal femur periprosthetic fracture  . RIGHT/LEFT HEART CATH AND CORONARY ANGIOGRAPHY N/A 05/14/2018   Procedure: RIGHT/LEFT HEART CATH AND CORONARY ANGIOGRAPHY;  Surgeon: Larey Dresser, MD;  Location: [place] [facility] LAB;  Service: Cardiovascular;  Laterality: N/A;  . SMALL INTESTINE SURGERY  2011   "really bad blockage; went into respiratory distress and was on ventilator after surgery; [facility]"  . SQUAMOUS CELL CARCINOMA EXCISION  ~ 2012   S/P "cut off her head then 15 radiation txs"   . TOTAL KNEE ARTHROPLASTY Bilateral    bilateral     Medications Prior to Admission: Prior to Admission medications   Medication Sig Start Date End Date Taking? Authorizing Provider  ALPRAZolam Duanne Moron) 0.25 MG tablet Take 0.25 mg by mouth daily as needed for anxiety.    Yes [provider]  aspirin EC 81 MG tablet Take 1 tablet (81 mg total) by mouth daily. 05/16/18  Yes Bhagat, Bhavinkumar, PA  atorvastatin (LIPITOR) 80 MG tablet Take 1 tablet (80 mg total) by mouth daily. 05/16/18  Yes Bhagat, Bhavinkumar, PA  carvedilol (COREG) 6.25 MG tablet Take 1 tablet (6.25 mg total) by mouth 2 (two) times daily with a meal. 08/31/18  Yes Larey Dresser, MD  chlorhexidine (PERIDEX) 0.12 % solution 15 mLs by Mouth Rinse route 2 (two) times daily as needed (sore gums).  05/05/18  Yes [provider]  clopidogrel (PLAVIX) 75 MG tablet Take 1 tablet (75 mg total) by mouth daily. 05/16/18  Yes Bhagat, Bhavinkumar, PA  cyclobenzaprine (FLEXERIL) 10 MG tablet Take 1 tablet (10 mg total) by mouth 3 (three) times daily as needed for muscle spasms. Patient taking differently: Take 10 mg by mouth 2 (two) times daily.  10/21/14  Yes Leonard Schwartz, MD  furosemide (LASIX) 20 MG tablet Take 1 tablet (20 mg total) by mouth daily. 05/22/18  Yes Shirley Friar, PA-C  gabapentin  (NEURONTIN) 300 MG capsule Take 300 mg by mouth at bedtime.    Yes [provider]  lactose free nutrition (BOOST) LIQD Take 237 mLs by mouth 2 (two) times daily between meals.    Yes [provider]  macitentan (OPSUMIT) 10 MG tablet Take 1 tablet (10 mg total) by mouth daily. Will need appointment with Dr. Aundra Dubin for further refills Patient taking differently: Take 10 mg by mouth daily.  05/16/18  Yes Bhagat, Bhavinkumar, PA  mirtazapine (REMERON) 30 MG tablet Take 30 mg by mouth at bedtime.   Yes [provider]  pantoprazole (PROTONIX) 40 MG tablet Take 1 tablet (40 mg total) by mouth 2 (two) times daily. Patient taking differently: Take 40 mg by mouth daily.  01/02/17  Yes Ghimire, Henreitta Leber, MD  potassium chloride SA (K-DUR,KLOR-CON) 20 MEQ tablet Take 1 tablet (20 mEq total) by mouth daily. 05/08/18  Yes Larey Dresser,  MD  sildenafil (REVATIO) 20 MG tablet Take 1 tablet (20 mg total) by mouth 3 (three) times daily. 03/06/18  Yes Josue Hector, MD  traMADol-acetaminophen (ULTRACET) 37.5-325 MG tablet Take 2 tablets by mouth 4 (four) times daily as needed for pain. 05/07/18  Yes [provider]     Allergies:    Allergies  Allergen Reactions  . Sulfa Antibiotics Swelling    Mouth and tongue swelling    Social History:   Social History   Tobacco Use  . Smoking status: Former Smoker    Packs/day: 0.10    Years: 10.00    Pack years: 1.00    Types: Cigarettes    Last attempt to quit: 03/31/1991    Years since quitting: 27.5  . Smokeless tobacco: Never Used  Substance Use Topics  . Alcohol use: No  . Drug use: No   Social History   Social History Narrative  . Not on file     Family History:   The patient's family history is noncontributory given her age and comorbidities.  ROS:  Please see the history of present illness.  All other ROS reviewed and negative.     Physical Exam/Data:   Vitals:   10/06/18 0900 10/06/18 0915 10/06/18  0930 10/06/18 0945  BP: (!) 160/77 (!) 161/92 (!) 146/66 (!) 149/65  Pulse: 87 88 88 87  Resp: 20 (!) 21 20 (!) 9  Temp:      TempSrc:      SpO2: 92% 93% 91% 92%  Weight:      Height:       No intake or output data in the 24 hours ending 10/06/18 1032 Last 3 Weights 10/06/2018 08/27/2018 07/21/2018  Weight (lbs) 149 lb 144 lb 9.6 oz 144 lb 9.6 oz  Weight (kg) 67.586 kg 65.59 kg 65.59 kg     Body mass index is 27.25 kg/m.  General:  Well nourished, well developed, in no acute distress, wearing [place] HEENT: [place]/AT/EOMI Lymph: no adenopathy Neck: + JVD with HJR (roughly 10 cmH2O) Endocrine:  No thryomegaly Vascular: No carotid bruits; FA pulses 2+ bilaterally without bruits  Cardiac:  normal S1, S2; RRR; no M/R/G. Lungs: Mostly CTA B, nonlabored.  Mild interstitial breath sounds/crackles.  No rales. Abd: soft, nontender, no hepatomegaly Ext: no clubbing/cyanosis/edema. Musculoskeletal:  No deformities, BUE and BLE strength normal and equal Skin: warm and dry  Neuro:  CNs 2-12 intact, no focal abnormalities noted Psych:  Normal affect   EKG:  The ECG that was done 10/06/2018 was personally reviewed and demonstrates SR with RBBB and PVcs  Relevant CV Studies:  Cath: 05/15/18   Prox LAD to Mid LAD lesion is 95% stenosed with 95% stenosed side branch in Ost 2nd Diag.  A drug-eluting stent was successfully placed in the LAD using a STENT SYNERGY DES 3X20. Post intervention, there is a 0% residual stenosis.  A drug-eluting stent was successfully placed in the ostial 2nd Diag using a STENT SYNERGY DES 2.75X8. Post intervention, the side branch was reduced to 0% residual stenosis.  Mid RCA lesion is 30% stenosed.   Successful bifurcation orbital atherectomy based PCI of LAD-D2 -with DK (double kiss) technique.  Synergy DES 2.75 mm x 8 mm ostial D2 (3.1 mm)  Synergy DES 3.0 mm x 20 mm mid LAD crossing D2 (3.4 mm)  Plan: Return to nursing unit for ongoing care and sheath removal per  protocol. Continue pulmonary pretension management per Dr. Aundra Dubin.  Recommend uninterrupted dual antiplatelet  therapy with Aspirin 46m daily and Clopidogrel 765mdaily for a minimum of 12 months (ACS - Class I recommendation).  Okay to stop aspirin after 6 months.  DaGlenetta HewM.D., M.S.  TTE: 02/20/18  Study Conclusions  - Left ventricle: There was moderate focal basal hypertrophy.   Systolic function was mildly reduced. The estimated ejection   fraction was in the range of 45% to 50%. Mild diffuse   hypokinesis. The study is not technically sufficient to allow   evaluation of LV diastolic function. - Ventricular septum: The contour showed an abnormal configuration,   diastolic flattening, and systolic flattening. These changes are   consistent with RV volume and pressure overload. - Aortic valve: Mildly thickened, mildly calcified leaflets. - Mitral valve: Calcified annulus. - Right ventricle: The cavity size was severely dilated. Systolic   function was severely reduced. - Right atrium: The atrium was moderately dilated. - Tricuspid valve: There was moderate regurgitation. - Pulmonic valve: There was mild regurgitation. - Pulmonary arteries: PA peak pressure: 61 mm Hg (S).  Impressions:  - The right ventricular systolic pressure was increased consistent   with moderate pulmonary hypertension.  Laboratory Data:  Chemistry Recent Labs  Lab 10/06/18 0729  NA 138  K 3.8  CL 104  CO2 24  GLUCOSE 198*  BUN 19  CREATININE 0.84  CALCIUM 9.4  GFRNONAA >60  GFRAA >60  ANIONGAP 10    Recent Labs  Lab 10/06/18 0729  PROT 7.4  ALBUMIN 3.3*  AST 18  ALT 14  ALKPHOS 55  BILITOT 0.4   Hematology Recent Labs  Lab 10/06/18 0729  WBC 11.4*  RBC 4.08  HGB 10.0*  HCT 35.4*  MCV 86.8  MCH 24.5*  MCHC 28.2*  RDW 17.7*  PLT 253   Cardiac Enzymes Recent Labs  Lab 10/06/18 0729  TROPONINI 0.04*   No results for input(s): TROPIPOC in the last 168 hours.    BNPNo results for input(s): BNP, PROBNP in the last 168 hours.  DDimer No results for input(s): DDIMER in the last 168 hours.  Radiology/Studies:  Dg Chest Portable 1 View  Result Date: 10/06/2018 CLINICAL DATA:  Chest pain radiating into both arms today. EXAM: PORTABLE CHEST 1 VIEW COMPARISON:  Single-view of the chest 05/19/2018. PA and lateral chest 05/18/2018. FINDINGS: There is cardiomegaly died edema. Atherosclerosis noted. Mild bibasilar subsegmental atelectasis is seen. No pneumothorax or pleural effusion. IMPRESSION: Cardiomegaly without acute disease. Atherosclerosis. Bibasilar subsegmental atelectasis. Electronically Signed   By: ThInge Rise.D.   On: 10/06/2018 08:59    Assessment and Plan:   RoVOLLIE AARONs a [age].o. female with PMH of CAD s/p bifurcation stenting, DM, HL, HTN, GERD, Stroke, pulmonary HTN on O2 therapy who presented with chest pain.   1. Unstable angina: Developed onset of chest pain this morning around 630am that was sharp/pressure like pain. Constant since that time. EKG with acute ischemia but freq PVCs. Initial Troponin 0.04. Placed on IV heparin. Given location and bifurcation stent, would plan for relook cath as she is still within the window for thrombosis.  -- The patient understands that risks included but are not limited to stroke (1 in 1000), death (1 in 1067 kidney failure [usually temporary] (1 in 500), bleeding (1 in 200), allergic reaction [possibly serious] (1 in 200).  -- continue IV heparin -- no nitro as she is on sildenafil -- ASA and plavix  2. Pulmonary HTN: will continue home medications including Opsumit and Revatio  3. HTN: blood pressures are elevated while in the ED.  -- continue coreg, may need further titration as she is slightly tachycardiac as well.  4. HL: on high dose statin -- check lipids  5. GERD: on PPI    Signed, Reino Bellis, NP  10/06/2018 10:32 AM   ATTENDING ATTESTATION  I have seen, examined  and evaluated the patient this morning in the emergency room along with Reino Bellis, NP.  After reviewing all the available data and chart, we discussed the patients laboratory, study & physical findings as well as symptoms in detail. I agree with her findings, examination as well as impression recommendations as per our discussion.    Physical exam performed by me.  Very pleasant 85 year old woman with significant pulmonary pretension as well as hypertension and hyperlipidemia with coronary disease status post complex atherectomy based bifurcation LAD-DIAG PCI last fall.  DES stents placed in the "kissing balloon" technique.  She now presents with symptoms that are somewhat concerning for unstable angina with onset of chest heaviness and pressure with dyspnea. Interestingly, she did not notice any of the symptoms while she was having her PCI.  However with ongoing chest pain, trivial troponin elevation and known recent PCI, I think the most prudent course of action is to proceed to the cardiac catheterization lab to exclude any stent related issues or any de novo lesions.  I spoke with Dr. Loralie Champagne.  He indicated that once she has had a procedure, the CHF team will happily take her out to their service since she is well-known to Dr. Aundra Dubin.   Performing MD: Likely Dr. Lauree Chandler or Sherren Mocha Procedure: LEFT HEART CATH with possible percutaneous coronary intervention  The procedure with Risks/Benefits/Alternatives and Indications was reviewed with the patient and her daughters.  All questions were answered.    Risks / Complications include, but not limited to: Death, MI, CVA/TIA, VF/VT (with defibrillation), Bradycardia (need for temporary pacer placement), contrast induced nephropathy, bleeding / bruising / hematoma / pseudoaneurysm, vascular or coronary injury (with possible emergent CT or Vascular Surgery), adverse medication reactions, infection.  Additional risks  involving the use of radiation with the possibility of radiation burns and cancer were explained in detail.  The patient ( and family) voice understanding and agree to proceed.     Severity of Illness: The appropriate patient status for this patient is INPATIENT. Inpatient status is judged to be reasonable and necessary in order to provide the required intensity of service to ensure the patient's safety. The patient's presenting symptoms, physical exam findings, and initial radiographic and laboratory data in the context of their chronic comorbidities is felt to place them at high risk for further clinical deterioration. Furthermore, it is not anticipated that the patient will be medically stable for discharge from the hospital within 2 midnights of admission. The following factors support the patient status of inpatient.   " The patient's presenting symptoms include chest pain. " The worrisome physical exam findings include stable exam. " The initial radiographic and laboratory data are worrisome because of + troponin. " The chronic co-morbidities include pulmonary HTN, HL, CAD.   * I certify that at the point of admission it is my clinical judgment that the patient will require inpatient hospital care spanning beyond 2 midnights from the point of admission due to high intensity of service, high risk for further deterioration and high frequency of surveillance required.Glenetta Hew, M.D., M.S. Interventional Cardiologist   Pager #  [phone] Phone # [phone] [address] [place], [place] [address]        For questions or updates, please contact [place] Please consult [email or web address] for contact info under

## 2018-10-06 NOTE — Progress Notes (Signed)
ANTICOAGULATION CONSULT NOTE - Initial Consult  Pharmacy Consult for heparin Indication: chest pain/ACS  Allergies  Allergen Reactions  . Sulfa Antibiotics Swelling    Mouth and tongue swelling    Patient Measurements: Height: 5' 2" (157.5 cm) Weight: 149 lb (67.6 kg) IBW/kg (Calculated) : 50.1 Heparin Dosing Weight: 64.1kg   Vital Signs: Temp: 100.3 F (37.9 C) (03/03 0716) Temp Source: Oral (03/03 0716) BP: 156/81 (03/03 0745) Pulse Rate: 90 (03/03 0745)  Labs: Recent Labs    10/06/18 0729  HGB 10.0*  HCT 35.4*  PLT 253  CREATININE 0.84  TROPONINI 0.04*    Estimated Creatinine Clearance: 44.9 mL/min (by C-G formula based on SCr of 0.84 mg/dL).   Medical History: Past Medical History:  Diagnosis Date  . Anxiety   . Arthritis    osteo; "mostly hands, knees, probably back too" (12/09/2016)  . Chronic lower back pain   . Complication of anesthesia 2011   resp distress -on vent after surgery  . Coronary artery disease   . Depression   . Diabetes ([place])   . Esophageal stricture   . GERD (gastroesophageal reflux disease)   . Headache    out grew them  . High cholesterol   . History of blood transfusion 2011; ?03/2012   "related to ORs" (12/09/2016)  . Hypertension   . Intestinal obstruction ([place])   . Macular degeneration of left eye    dx over 55 yrs ago.....hasn't changed much  . Shingles   . Squamous carcinoma 2013   squamas cell on scalp--took 14 radiation tx--2013  . Stroke [facility]) 1967   Mini stroke;  No lasting deficits  . Type II diabetes mellitus ([place]) dx'd 11/2016    Medications:  Infusions:  . heparin 800 Units/hr (10/06/18 0910)    Assessment: 45 yof presented to the ED with CP. Troponin is elevated and now starting IV heparin. Hgb is slightly low and platelets are WNL. She is not on anticoagulation PTA.   Goal of Therapy:  Heparin level 0.3-0.7 units/ml Monitor platelets by anticoagulation protocol: Yes   Plan:  Heparin bolus 3800 units  IV x 1 Heparin gtt 800 units/hr Check an 8 hr heparin level Daily heparin level and CBC  Huberta Tompkins, Rande Lawman 10/06/2018,8:43 AM

## 2018-10-06 NOTE — Progress Notes (Addendum)
Pharmacy Antibiotic Note  [name] is a 86 y.o. female admitted on 10/06/2018 with sepsis.  Pharmacy has been consulted for vancomycin/cefepime dosing. Tmax/24h 101.2, WBC 11.4, LA 1.4. SCr 0.84 on admit.  Cefepime 2g IV x 1 and vancomycin 1g IV x 1 already given in the ER.  Plan: Cefepime 2g IV q24h Give additional vancomycin 556m IV x 1 to complete 15079mloading dose; then Vancomycin 125062mV Q 36 hrs. Goal AUC 400-550. Expected AUC: 461 SCr used: 0.84 Monitor clinical progress, c/s, renal function F/u de-escalation plan/LOT, vancomycin levels as indicated   Height: _0  (157.5 cm) Weight: 149 lb (67.6 kg) IBW/kg (Calculated) : 50.1  Temp (24hrs), Avg:100.6 F (38.1 C), Min:100.2 F (37.9 C), Max:101.2 F (38.4 C)  Recent Labs  Lab 10/06/18 0729  WBC 11.4*  CREATININE 0.84  LATICACIDVEN 1.4    Estimated Creatinine Clearance: 44.9 mL/min (by C-G formula based on SCr of 0.84 mg/dL).    Allergies  Allergen Reactions  . Sulfa Antibiotics Swelling    Mouth and tongue swelling    Antimicrobials this admission: 3/3 vancomycin >>  3/3 cefepime >>   Dose adjustments this admission:   Microbiology results:   HalElicia LampharmD, BCPS Clinical Pharmacist 10/06/2018 3:54 PM

## 2018-10-06 NOTE — ED Notes (Signed)
Pt now stats she does not feel she can go to cath lab due to feeling so bad. Explained to pt that cath lab to help with  pain.  States "You just don't understand."  Contact made with cath lab and same reported.  Requested provider be paged by  ED sec.

## 2018-10-06 NOTE — ED Triage Notes (Signed)
Pt arrives [facility] from home where pt c/o waking 0630 with chest pain central radiating to left arm. Pt took aspirin 324 PTA. Pt arrives A&O x 4. Pt uses O2 4l/[place] at all times. Pt state normal pulse ox 90-92.

## 2018-10-06 NOTE — ED Notes (Addendum)
Pt states pain increasing and request further pain meds . Will contact provider.. Engineer, maintenance (IT) paged.

## 2018-10-06 NOTE — H&P (Addendum)
[facility] Admission History and Physical Service Pager: [phone]  Patient name: [name] Medical record number: [id number] Date of birth: 12-06-1931 Age: 85 y.o. Gender: female  Primary Care Provider: [name], PA-C Consultants: Cardiology Code Status: DNR- confirmed with patient and family on admission  Chief Complaint: Chest pain radiating to shoulders and back  Assessment and Plan: [name] is a 85 y.o. female presenting with chest pain radiating to the shoulders and back. PMH is significant for CAD s/p stents, pulmonary HTN, GERD, HTN, HLD, GERD, Hx CVA, Depression, CKD III, h/o Osteomyelitis of Jaw, ?T2DM.  Chest Pain 2/2 Unstable Angina, h/o CAD s/p stent, CHF NYHA III Developed chest pain while asleep at 3:00 am that felt sharp and stabbing. She said that she has never experienced pain like this before.  Troponins stable, 0.04 x2. EKG concerning for inferiorlateral ischemia, RBBB with freq PVCs. Cardiology following with initial plan for catheterization however will work up infectious etiology first (see below). Patient placed on IV heparin in the ED. Recommending cath once patient is afebrile. Has h/o exertional dyspnea ever since PNA in 10/2017, started on home O2 at that time. Most recent ECHO 02/2018 with LVEF 45-50% with mild diffuse hypokinesis and septal flattening. Prior smoking history though >20 years ago. V/Q scan 03/2018 without evidence for PE. Current Wells score 0. CXR with cardiomegaly and slight blunting of costophrenic angles but no appreciable effusions or consolidation. Slight bibasilar rales on exam. Lack of cough make pneumonia or CHF exacerbation less likely. Also without LE edema. Patient can't receive nitro due to sildenafil. Also with h/o GERD with radiation of pain, may have GI element to pain. Will order GI cocktail as well. - admit to tele, attending Dr. Gwendlyn Deutscher -Continue morphine for pain control. Patient can not  take nitro as she is on sildenafil. -Continue IV heparin -Continue ASA and plavix -Appreciate cardiology input - zofran prn - obtain BNP - am EKG - am BMP/CBC - oxygen therapy as needed - trend trops - continue home lasix  - GI cocktail  Fever Unclear source, no URI sx, no dysuria, no wound history or evidence of wounds on exam. Other VSS. No infectious evidence on CXR to suggest PNA, also no cough. WBC 11.4. Patient presented with a low grade fever of 100 and spiked to 101. Urine and blood cultures were taken. Influenza A and B negative. Patient given cefepime/vanc in the ED.  -awaiting urinalysis -Awaiting urine and blood culture - RVP  Pulmonary HTN, chronic Followed by Dr. Aundra Dubin with HF. Most recent estimated PA pressure 61 mmHg, L/RHC 05/2018. Underwent rheumatological work up concerning for possible CREST syndrome. On opsumit, sildenafil at home. Able to walk 1107f at baseline without SOB, started on 2L home O2 after PNA 10/2017. -will continue home medication of opsumit and sildenafil.  HTN, chronic  Blood pressure is elevated on admission to 150s SBP. On coreg, lasix at home.  - Continue coreg, may increase dose if patient continues to be hypertensive and tachycardic  HLD Most recent lipid panel 08/2018 with LDL 49, on high dose statin - continue lipitor  GERD May contribute to chest pain as above. -Continue PPI -GI cocktail  Normocytic Anemia Hb 10 on admission (stable from 1 month ago), baseline ~9-10. Of note had acute GI bleed during 12/2016 admission for OM of the jaw. No current evidence of bleeding. - monitor - consider iron panel  ?CKD III Noted per chart although appears to have normal baseline.  Cr on admission wnl.  - monitor  ?T2DM Noted per chart although daughter states only had issues with her blood sugars when she was admitted for sepsis 12/2016 2/2 osteomyelitis of the jaw and not currently on medication. Per chart review, was previously on  metformin. Glucose on admission 198. - obtain A1c - monitor with BMP  Chronic back pain Takes tramadol-APAP at home. Currently holding as she is receiving morphine.  - hold home meds, monitor  FEN/GI: Cardiac diet, PPI/GI cocktail Prophylaxis: heparin gtt  Disposition: Admit to telemetry with expected stay greater than 2 nights. Follow up on cultures and proceed to cath once afebrile for >24 hours.  History of Present Illness:  [name] is a 85 y.o. female presenting with chest pain. She woke up this morning around 3am with 6-7/10 central chest pain described as sharp, squeezing, burning, that radiated to her face, bilateral arms, and epigastric area. It was not alleviated until she received morphine in the ED. No N/V. Denies fevers or chills prior to presenting to the ED. This has never happened before. Usually wears 4L O2 at baseline at home. Denies sick contacts. No cough, congestion, runny nose. She smoked tobacco for 10-15 years, about 1 pack per week in her 22s. No alcohol use. Her sister had a pacemaker placed in her 7s, unknown reason. She lives alone but lives in a house behind her daughter. Ambulates without assistance but uses a cane when she gets up in the middle of the night to use the bathroom. She is independent in all of her ADLs.  She was admitted 05/2018 for PCI requiring atherectomy of an LAD lesion. Has been chest pain free since then until presentation today.  In the ED, Cardiology was consulted for admission and planned for [facility] today, however patient became febrile up to 101.2 and medicine service consulted for admission. An ECHO was performed in the ED, results pending at time of admission.   Review Of Systems: Per HPI with the following additions:   Review of Systems  Constitutional: Positive for diaphoresis. Negative for chills and fever.  HENT: Negative for congestion.   Eyes: Negative for blurred vision and double vision.  Respiratory: Positive for shortness  of breath. Negative for cough.   Cardiovascular: Positive for chest pain.  Gastrointestinal: Negative for abdominal pain, constipation, diarrhea, nausea and vomiting.  Genitourinary: Negative for dysuria.  Neurological: Negative for weakness.   Patient Active Problem List   Diagnosis Date Noted  . Unstable angina ([place]) 10/06/2018  . 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 Pulmonary hypertension, unspecified ([place])   . Status post coronary artery stent placement   . Coronary artery disease involving native coronary artery of native heart with angina pectoris ([place])   . Exertional dyspnea 05/14/2018  . Abnormal CT scan, gastrointestinal tract 12/31/2016  . GI bleed 12/31/2016  . HLD (hyperlipidemia) 12/09/2016  . GERD (gastroesophageal reflux disease) 12/09/2016  . Depression 12/09/2016  . History of CVA (cerebrovascular accident) 12/09/2016  . CKD (chronic kidney disease), stage III ([place]) 12/09/2016  . Back pain 12/09/2016  . Sepsis ([place]) 12/09/2016  . History of oral surgery 12/09/2016  . Osteomyelitis of jaw 12/09/2016  . Diabetes mellitus type 2, uncontrolled ([place]) 12/09/2016  . History of Lumbar herniated disc 08/09/2014  . UTI (lower urinary tract infection) 04/08/2012  . Pneumonia 04/04/2012  . Acute blood loss anemia 04/01/2012  . Fall at home 03/31/2012  . History of shingles 03/31/2012  . Anemia 03/31/2012  .  Femur fracture, left ([place]) 03/31/2012  . Hypertension 03/30/2012    Past Medical History: Past Medical History:  Diagnosis Date  . Anxiety   . Arthritis    osteo; "mostly hands, knees, probably back too" (12/09/2016)  . Chronic lower back pain   . Complication of anesthesia 2011   resp distress -on vent after surgery  . Coronary artery disease   . Depression   . Diabetes ([place])   . Esophageal stricture   . GERD (gastroesophageal reflux disease)   . Headache    out grew them  . High cholesterol   . History of  blood transfusion 2011; ?03/2012   "related to ORs" (12/09/2016)  . Hypertension   . Intestinal obstruction ([place])   . Macular degeneration of left eye    dx over 55 yrs ago.....hasn't changed much  . Shingles   . Squamous carcinoma 2013   squamas cell on scalp--took 14 radiation tx--2013  . Stroke [facility]) 1967   Mini stroke;  No lasting deficits  . Type II diabetes mellitus ([place]) dx'd 11/2016    Past Surgical History: Past Surgical History:  Procedure Laterality Date  . ABDOMINAL HYSTERECTOMY    . APPENDECTOMY    . BACK SURGERY    . CATARACT EXTRACTION, BILATERAL Bilateral   . CHOLECYSTECTOMY    . COLON SURGERY    . COLONOSCOPY    . CORONARY ATHERECTOMY N/A 05/15/2018   Procedure: CORONARY ATHERECTOMY;  Surgeon: Leonie Man, MD;  Location: [facility];  Service: Cardiovascular;  Laterality: N/A;  . CORONARY STENT INTERVENTION N/A 05/15/2018   Procedure: CORONARY STENT INTERVENTION;  Surgeon: Leonie Man, MD;  Location: [facility];  Service: Cardiovascular;  Laterality: N/A;  . ESOPHAGOGASTRODUODENOSCOPY (EGD) WITH ESOPHAGEAL DILATION  "several times"  . ESOPHAGOGASTRODUODENOSCOPY (EGD) WITH PROPOFOL N/A 12/31/2016   Procedure: ESOPHAGOGASTRODUODENOSCOPY (EGD) WITH PROPOFOL;  Surgeon: Wilford Corner, MD;  Location: [facility];  Service: Endoscopy;  Laterality: N/A;  . EYE SURGERY    . FRACTURE SURGERY    . IR FLUORO GUIDE CV LINE RIGHT  01/02/2017  . IR US GUIDE VASC ACCESS RIGHT  01/02/2017  . JOINT REPLACEMENT    . LUMBAR DISC SURGERY  10/25/2014   Right L4-L5 removal of seven free fragments of disk mostly posterolaterally.  Lysis of adhesion.  Microscope/notes 10/26/2014  . LUMBAR LAMINECTOMY/DECOMPRESSION MICRODISCECTOMY Right 08/09/2014   Procedure: Right Lumbar Four to Five Microdiskectomy;  Surgeon: Floyce Stakes, MD;  Location: [facility];  Service: Neurosurgery;  Laterality: Right;  Right L4-5 Microdiskectomy  . ORIF PERIPROSTHETIC FRACTURE   03/31/2012   Procedure: OPEN REDUCTION INTERNAL FIXATION (ORIF) PERIPROSTHETIC FRACTURE;  Surgeon: Mauri Pole, MD;  Location: [facility];  Service: Orthopedics;  Laterality: Left;  Open reduction internal fixation Left distal femur periprosthetic fracture  . RIGHT/LEFT HEART CATH AND CORONARY ANGIOGRAPHY N/A 05/14/2018   Procedure: RIGHT/LEFT HEART CATH AND CORONARY ANGIOGRAPHY;  Surgeon: Larey Dresser, MD;  Location: [facility];  Service: Cardiovascular;  Laterality: N/A;  . SMALL INTESTINE SURGERY  2011   "really bad blockage; went into respiratory distress and was on ventilator after surgery; [facility]"  . SQUAMOUS CELL CARCINOMA EXCISION  ~ 2012   S/P "cut off her head then 15 radiation txs"   . TOTAL KNEE ARTHROPLASTY Bilateral    bilateral    Social History: Social History   Tobacco Use  . Smoking status: Former Smoker    Packs/day: 0.10    Years:  10.00    Pack years: 1.00    Types: Cigarettes    Last attempt to quit: 03/31/1991    Years since quitting: 27.5  . Smokeless tobacco: Never Used  Substance Use Topics  . Alcohol use: No  . Drug use: No   Lives alone behind daughter. Uses cane at night when she gets up to use the bathroom. Independent in ADLs.  Family History: Sister and mother had heart complications, sister with pacemaker in her 82s.  Allergies and Medications: Allergies  Allergen Reactions  . Sulfa Antibiotics Swelling    Mouth and tongue swelling   No current facility-administered medications on file prior to encounter.    Current Outpatient Medications on File Prior to Encounter  Medication Sig Dispense Refill  . ALPRAZolam (XANAX) 0.25 MG tablet Take 0.25 mg by mouth daily as needed for anxiety.     Marland Kitchen aspirin EC 81 MG tablet Take 1 tablet (81 mg total) by mouth daily. 30 tablet 11  . atorvastatin (LIPITOR) 80 MG tablet Take 1 tablet (80 mg total) by mouth daily. 30 tablet 6  . carvedilol (COREG) 6.25 MG tablet Take 1 tablet (6.25 mg  total) by mouth 2 (two) times daily with a meal. 60 tablet 3  . chlorhexidine (PERIDEX) 0.12 % solution 15 mLs by Mouth Rinse route 2 (two) times daily as needed (sore gums).   4  . clopidogrel (PLAVIX) 75 MG tablet Take 1 tablet (75 mg total) by mouth daily. 30 tablet 6  . cyclobenzaprine (FLEXERIL) 10 MG tablet Take 1 tablet (10 mg total) by mouth 3 (three) times daily as needed for muscle spasms. (Patient taking differently: Take 10 mg by mouth 2 (two) times daily. ) 30 tablet 0  . furosemide (LASIX) 20 MG tablet Take 1 tablet (20 mg total) by mouth daily. 30 tablet 6  . gabapentin (NEURONTIN) 300 MG capsule Take 300 mg by mouth at bedtime.     . lactose free nutrition (BOOST) LIQD Take 237 mLs by mouth 2 (two) times daily between meals.     . macitentan (OPSUMIT) 10 MG tablet Take 1 tablet (10 mg total) by mouth daily. Will need appointment with Dr. Aundra Dubin for further refills (Patient taking differently: Take 10 mg by mouth daily. ) 30 tablet 1  . mirtazapine (REMERON) 30 MG tablet Take 30 mg by mouth at bedtime.    . pantoprazole (PROTONIX) 40 MG tablet Take 1 tablet (40 mg total) by mouth 2 (two) times daily. (Patient taking differently: Take 40 mg by mouth daily. ) 120 tablet 0  . potassium chloride SA (K-DUR,KLOR-CON) 20 MEQ tablet Take 1 tablet (20 mEq total) by mouth daily. 30 tablet 6  . sildenafil (REVATIO) 20 MG tablet Take 1 tablet (20 mg total) by mouth 3 (three) times daily. 90 tablet 11  . traMADol-acetaminophen (ULTRACET) 37.5-325 MG tablet Take 2 tablets by mouth 4 (four) times daily as needed for pain.  2    Objective: BP (!) 154/93   Pulse 96   Temp 100.2 F (37.9 C)   Resp (!) 22   Ht _0  (1.575 m)   Wt 67.6 kg   SpO2 92%   BMI 27.25 kg/m  Exam: Physical Exam  Constitutional: She is oriented to person, place, and time. She appears distressed.  HENT:  Head: Normocephalic and atraumatic.  Mouth/Throat: Oropharynx is clear and moist.  Dentures in place.  Eyes:  Pupils are equal, round, and reactive to light.  Cardiovascular: Normal rate, regular  rhythm and normal heart sounds. Exam reveals no gallop.  No murmur heard. Pulmonary/Chest: No respiratory distress. She exhibits tenderness.  Bibasilar rales.  Abdominal: Soft. Bowel sounds are normal. She exhibits no distension. There is no abdominal tenderness. There is no rebound and no guarding.  Musculoskeletal:        General: No tenderness or deformity.     Comments: 5/5 strength U/LE bilaterally.  Lymphadenopathy:    She has no cervical adenopathy.  Neurological: She is alert and oriented to person, place, and time. No cranial nerve deficit.  Skin: Skin is warm. No rash noted. She is diaphoretic.  Psychiatric: Affect and judgment normal.   Labs and Imaging: CBC BMET  Recent Labs  Lab 10/06/18 0729  WBC 11.4*  HGB 10.0*  HCT 35.4*  PLT 253   Recent Labs  Lab 10/06/18 0729  NA 138  K 3.8  CL 104  CO2 24  BUN 19  CREATININE 0.84  GLUCOSE 198*  CALCIUM 9.4     Dg Chest Portable 1 View  Result Date: 10/06/2018 CLINICAL DATA:  Chest pain radiating into both arms today. EXAM: PORTABLE CHEST 1 VIEW COMPARISON:  Single-view of the chest 05/19/2018. PA and lateral chest 05/18/2018. FINDINGS: There is cardiomegaly died edema. Atherosclerosis noted. Mild bibasilar subsegmental atelectasis is seen. No pneumothorax or pleural effusion. IMPRESSION: Cardiomegaly without acute disease. Atherosclerosis. Bibasilar subsegmental atelectasis. Electronically Signed   By: Inge Rise M.D.   On: 10/06/2018 08:59   [name], Medical Student 10/06/2018, 3:45 PM MD student, [place] Intern pager: [phone], text pages welcome  FPTS Upper-Level Resident Addendum   I have independently interviewed and examined the patient. I have discussed the above with the original author and agree with their documentation. My edits for correction/addition/clarification are in green. Please  see also any attending notes.    Rory Percy, DO PGY-2, [facility] 10/06/2018 6:11 PM  [place] Service pager: [phone] (text pages welcome through [facility])

## 2018-10-06 NOTE — ED Notes (Signed)
Echo at bedside

## 2018-10-06 NOTE — Consult Note (Signed)
Cardiology Consultation:   Patient ID: [name] MRN: [id number]; DOB: 11/28/1931  Admit date: 10/06/2018 Date of Consult: 10/06/2018  Primary Care Provider: [name], PA-C Primary Cardiologist: Jenkins Rouge, MD  Primary Electrophysiologist:  None   Chief Complaint:  Chest pain  Patient Profile:   [name] is a 85 y.o. female with a with PMH of CAD s/p bifurcation stenting, DM, HL, HTN, GERD, Stroke, pulmonary HTN on O2 therapy who presented to [facility] emergency room with complaint of substernal chest pain/pressure (and now fever).  She is being seen at the request of EDP, Dr. Jola Schmidt.  History of Present Illness:   [name] is a 85 yo female with PMH noted above. She is followed by Dr. Aundra Dubin for her pulmonary HTN. She recently under cath back in 10/19 with calcified lesion in the LAD/Diag with bifurcation stenting of the LAD/Diag with double kiss technique. Placed on DAPT with ASA/plavix post cath. She never had chest pain with her presentation only shortness of breath. Last echo in 7/19 with EF of 45-50%.   Presents back to the ED this morning with chest pain. Reports she was awoken from sleep around 630am with sharp/pressure like chest pain in the center of her chest. Rated an 8/10. Called EMS and took 334m ASA. Symptoms persisted until she got to the ED. Given IV morphine with improvement. Reports being compliant with medications.   In the ED her labs showed stable electrolytes, Hgb 10, Trop 0.04. EKG with SR, RBBB and Bigeminy. She still with active chest pain at the time of exam in the ED.    Past Medical History:  Diagnosis Date  . Anxiety   . Arthritis    osteo; "mostly hands, knees, probably back too" (12/09/2016)  . Chronic lower back pain   . Complication of anesthesia 2011   resp distress -on vent after surgery  . Coronary artery disease   . Depression   . Diabetes [place]   . Esophageal stricture   . GERD (gastroesophageal reflux disease)   .  Headache    out grew them  . High cholesterol   . History of blood transfusion 2011; ?03/2012   "related to ORs" (12/09/2016)  . Hypertension   . [place]testinal obstruction ([place]   . Macular degeneration of left eye    dx over 55 yrs ago.....hasn't changed much  . Shingles   . Squamous carcinoma 2013   squamas cell on scalp--took 14 radiation tx--2013  . Stroke ([facility] 1967   Mini stroke;  No lasting deficits  . Type II diabetes mellitus ([place] dx'd 11/2016    Past Surgical History:  Procedure Laterality Date  . ABDOMINAL HYSTERECTOMY    . APPENDECTOMY    . BACK SURGERY    . CATARACT EXTRACTION, BILATERAL Bilateral   . CHOLECYSTECTOMY    . COLON SURGERY    . COLONOSCOPY    . CORONARY ATHERECTOMY N/A 05/15/2018   Procedure: CORONARY ATHERECTOMY;  Surgeon: HLeonie Man MD;  [place];  Service: Cardiovascular;  Laterality: N/A;  . CORONARY STENT INTERVENTION N/A 05/15/2018   Procedure: CORONARY STENT INTERVENTION;  Surgeon: HLeonie Man MD;  [place];  Service: Cardiovascular;  Laterality: N/A;  . ESOPHAGOGASTRODUODENOSCOPY (EGD) WITH ESOPHAGEAL DILATION  "several times"  . ESOPHAGOGASTRODUODENOSCOPY (EGD) WITH PROPOFOL N/A 12/31/2016   Procedure: ESOPHAGOGASTRODUODENOSCOPY (EGD) WITH PROPOFOL;  Surgeon: SWilford Corner MD;  [place]  Service: Endoscopy;  Laterality: N/A;  . EYE  SURGERY    . FRACTURE SURGERY    . IR FLUORO GUIDE CV LINE RIGHT  01/02/2017  . IR US GUIDE VASC ACCESS RIGHT  01/02/2017  . JOINT REPLACEMENT    . LUMBAR DISC SURGERY  10/25/2014   Right L4-L5 removal of seven free fragments of disk mostly posterolaterally.  Lysis of adhesion.  Microscope/notes 10/26/2014  . LUMBAR LAMINECTOMY/DECOMPRESSION MICRODISCECTOMY Right 08/09/2014   Procedure: Right Lumbar Four to Five Microdiskectomy;  Surgeon: Floyce Stakes, MD;  Location: [facility];  Service: Neurosurgery;  Laterality: Right;  Right L4-5  Microdiskectomy  . ORIF PERIPROSTHETIC FRACTURE  03/31/2012   Procedure: OPEN REDUCTION INTERNAL FIXATION (ORIF) PERIPROSTHETIC FRACTURE;  Surgeon: Mauri Pole, MD;  Location: [facility];  Service: Orthopedics;  Laterality: Left;  Open reduction internal fixation Left distal femur periprosthetic fracture  . RIGHT/LEFT HEART CATH AND CORONARY ANGIOGRAPHY N/A 05/14/2018   Procedure: RIGHT/LEFT HEART CATH AND CORONARY ANGIOGRAPHY;  Surgeon: Larey Dresser, MD;  Location: [place] [facility];  Service: Cardiovascular;  Laterality: N/A;  . SMALL INTESTINE SURGERY  2011   "really bad blockage; went into respiratory distress and was on ventilator after surgery; [facility]"  . SQUAMOUS CELL CARCINOMA EXCISION  ~ 2012   S/P "cut off her head then 15 radiation txs"   . TOTAL KNEE ARTHROPLASTY Bilateral    bilateral     Home Medications:  Prior to Admission medications   Medication Sig Start Date End Date Taking? Authorizing Provider  ALPRAZolam Duanne Moron) 0.25 MG tablet Take 0.25 mg by mouth daily as needed for anxiety.    Yes [provider]  aspirin EC 81 MG tablet Take 1 tablet (81 mg total) by mouth daily. 05/16/18  Yes Bhagat, Bhavinkumar, PA  atorvastatin (LIPITOR) 80 MG tablet Take 1 tablet (80 mg total) by mouth daily. 05/16/18  Yes Bhagat, Bhavinkumar, PA  carvedilol (COREG) 6.25 MG tablet Take 1 tablet (6.25 mg total) by mouth 2 (two) times daily with a meal. 08/31/18  Yes Larey Dresser, MD  chlorhexidine (PERIDEX) 0.12 % solution 15 mLs by Mouth Rinse route 2 (two) times daily as needed (sore gums).  05/05/18  Yes [provider]  clopidogrel (PLAVIX) 75 MG tablet Take 1 tablet (75 mg total) by mouth daily. 05/16/18  Yes Bhagat, Bhavinkumar, PA  cyclobenzaprine (FLEXERIL) 10 MG tablet Take 1 tablet (10 mg total) by mouth 3 (three) times daily as needed for muscle spasms. Patient taking differently: Take 10 mg by mouth 2 (two) times daily.  10/21/14  Yes Leonard Schwartz, MD   furosemide (LASIX) 20 MG tablet Take 1 tablet (20 mg total) by mouth daily. 05/22/18  Yes Shirley Friar, PA-C  gabapentin (NEURONTIN) 300 MG capsule Take 300 mg by mouth at bedtime.    Yes [provider]  lactose free nutrition (BOOST) LIQD Take 237 mLs by mouth 2 (two) times daily between meals.    Yes [provider]  macitentan (OPSUMIT) 10 MG tablet Take 1 tablet (10 mg total) by mouth daily. Will need appointment with Dr. Aundra Dubin for further refills Patient taking differently: Take 10 mg by mouth daily.  05/16/18  Yes Bhagat, Bhavinkumar, PA  mirtazapine (REMERON) 30 MG tablet Take 30 mg by mouth at bedtime.   Yes [provider]  pantoprazole (PROTONIX) 40 MG tablet Take 1 tablet (40 mg total) by mouth 2 (two) times daily. Patient taking differently: Take 40 mg by mouth daily.  01/02/17  Yes Ghimire, Henreitta Leber,  MD  potassium chloride SA (K-DUR,KLOR-CON) 20 MEQ tablet Take 1 tablet (20 mEq total) by mouth daily. 05/08/18  Yes Larey Dresser, MD  sildenafil (REVATIO) 20 MG tablet Take 1 tablet (20 mg total) by mouth 3 (three) times daily. 03/06/18  Yes Josue Hector, MD  traMADol-acetaminophen (ULTRACET) 37.5-325 MG tablet Take 2 tablets by mouth 4 (four) times daily as needed for pain. 05/07/18  Yes [provider]    Inpatient Medications: Scheduled Meds: . aspirin  324 mg Oral NOW   Or  . aspirin  300 mg Rectal NOW  . [START ON 10/07/2018] aspirin EC  81 mg Oral Daily  . clopidogrel  75 mg Oral Daily   Continuous Infusions: . sodium chloride    . ceFEPime (MAXIPIME) IV    . heparin 800 Units/hr (10/06/18 0910)  . vancomycin     PRN Meds: acetaminophen, morphine injection, ondansetron (ZOFRAN) IV  Allergies:    Allergies  Allergen Reactions  . Sulfa Antibiotics Swelling    Mouth and tongue swelling    Social History:   Social History   Tobacco Use  . Smoking status: Former Smoker    Packs/day: 0.10    Years: 10.00    Pack  years: 1.00    Types: Cigarettes    Last attempt to quit: 03/31/1991    Years since quitting: 27.5  . Smokeless tobacco: Never Used  Substance Use Topics  . Alcohol use: No  . Drug use: No   Social History   Social History Narrative  . Not on file     Family History:   Family history is noncontributory due to her age and comorbidities  ROS:  Please see the history of present illness.  Low-grade fever, generalized aching.  Generally does not feel well.  No real nausea or vomiting. (She says she always runs a low-grade fever, however on reevaluation in the ER she now has a fever 101.  Feeling very weak and tired).  No nausea vomiting or diarrhea. All other ROS reviewed and negative.     Physical Exam/Data:   Vitals:   10/06/18 1130 10/06/18 1145 10/06/18 1200 10/06/18 1250  BP: (!) 143/79 (!) 141/65 (!) 147/65 (!) 162/77  Pulse: 86 87 87 88  Resp: _0 (!) 23  Temp:      TempSrc:      SpO2: 92% 94% 93% 91%  Weight:      Height:        Intake/Output Summary (Last 24 hours) at 10/06/2018 1413 Last data filed at 10/06/2018 1320 Gross per 24 hour  Intake 70.98 ml  Output -  Net 70.98 ml   Last 3 Weights 10/06/2018 08/27/2018 07/21/2018  Weight (lbs) 149 lb 144 lb 9.6 oz 144 lb 9.6 oz  Weight (kg) 67.586 kg 65.59 kg 65.59 kg     Body mass index is 27.25 kg/m.  General:  Well nourished, well developed, in no acute distress, wearing [place] HEENT: [place]/AT/EOMI Lymph: no adenopathy Neck: + JVD with HJR (roughly 10 cmH2O) Endocrine:  No thryomegaly Vascular: No carotid bruits; FA pulses 2+ bilaterally without bruits  Cardiac:  normal S1, S2; RRR; no M/R/G. Lungs: Mostly CTA B, nonlabored.  Mild interstitial breath sounds/crackles.  No rales. Abd: soft, nontender, no hepatomegaly Ext: no clubbing/cyanosis/edema. Musculoskeletal:  No deformities, BUE and BLE strength normal and equal Skin: warm and dry  Neuro:  CNs 2-12 intact, no focal abnormalities noted Psych:  Normal  affect  EKG:  The  ECG that was done 10/06/2018 was personally reviewed and demonstrates SR with RBBB and PVcs  Relevant CV Studies: Cath: 05/15/18   Prox LAD to Mid LAD lesion is 95% stenosed with 95% stenosed side branch in Ost 2nd Diag.  A drug-eluting stent was successfully placed in the LAD using a STENT SYNERGY DES 3X20. Post intervention, there is a 0% residual stenosis.  A drug-eluting stent was successfully placed in the ostial 2nd Diag using a STENT SYNERGY DES 2.75X8. Post intervention, the side branch was reduced to 0% residual stenosis.  Mid RCA lesion is 30% stenosed.  Successful bifurcation orbital atherectomy based PCI of LAD-D2 -with DK (double kiss) technique.  Synergy DES 2.75 mm x 8 mm ostial D2 (3.1 mm)  Synergy DES 3.0 mm x 20 mm mid LAD crossing D2 (3.4 mm)  Plan: Return to nursing unit for ongoing care and sheath removal per protocol. Continue pulmonary pretension management per Dr. Aundra Dubin.  Recommend uninterrupted dual antiplatelet therapy with Aspirin 22m daily and Clopidogrel 71mdailyfor a minimum of 12 months (ACS - Class I recommendation).Okay to stop aspirin after 6 months.  TTE: 02/20/18  Study Conclusions  - Left ventricle: There was moderate focal basal hypertrophy. Systolic function was mildly reduced. The estimated ejection fraction was in the range of 45% to 50%. Mild diffuse hypokinesis. The study is not technically sufficient to allow evaluation of LV diastolic function. - Ventricular septum: The contour showed an abnormal configuration, diastolic flattening, and systolic flattening. These changes are consistent with RV volume and pressure overload. - Aortic valve: Mildly thickened, mildly calcified leaflets. - Mitral valve: Calcified annulus. - Right ventricle: The cavity size was severely dilated. Systolic function was severely reduced. - Right atrium: The atrium was moderately dilated. - Tricuspid valve: There  was moderate regurgitation. - Pulmonic valve: There was mild regurgitation. - Pulmonary arteries: PA peak pressure: 61 mm Hg (S).  Impressions:  - The right ventricular systolic pressure was increased consistent with moderate pulmonary hypertension.  Laboratory Data:  Chemistry Recent Labs  Lab 10/06/18 0729  NA 138  K 3.8  CL 104  CO2 24  GLUCOSE 198*  BUN 19  CREATININE 0.84  CALCIUM 9.4  GFRNONAA >60  GFRAA >60  ANIONGAP 10    Recent Labs  Lab 10/06/18 0729  PROT 7.4  ALBUMIN 3.3*  AST 18  ALT 14  ALKPHOS 55  BILITOT 0.4   Hematology Recent Labs  Lab 10/06/18 0729  WBC 11.4*  RBC 4.08  HGB 10.0*  HCT 35.4*  MCV 86.8  MCH 24.5*  MCHC 28.2*  RDW 17.7*  PLT 253   Cardiac Enzymes Recent Labs  Lab 10/06/18 0729 10/06/18 1048  TROPONINI 0.04* 0.04*   No results for input(s): TROPIPOC in the last 168 hours.  BNPNo results for input(s): BNP, PROBNP in the last 168 hours.  DDimer No results for input(s): DDIMER in the last 168 hours.  Radiology/Studies:  Dg Chest Portable 1 View  Result Date: 10/06/2018 CLINICAL DATA:  Chest pain radiating into both arms today. EXAM: PORTABLE CHEST 1 VIEW COMPARISON:  Single-view of the chest 05/19/2018. PA and lateral chest 05/18/2018. FINDINGS: There is cardiomegaly died edema. Atherosclerosis noted. Mild bibasilar subsegmental atelectasis is seen. No pneumothorax or pleural effusion. IMPRESSION: Cardiomegaly without acute disease. Atherosclerosis. Bibasilar subsegmental atelectasis. Electronically Signed   By: ThInge Rise.D.   On: 10/06/2018 08:59    Assessment and Plan:   RoMADDYN LIEURANCEs a [age].o. female with PMH of  CAD s/p bifurcation stenting, DM, HL, HTN, GERD, Stroke, pulmonary HTN on O2 therapy who presented with chest pain.   1. Unstable angina: Developed onset of chest pain this morning around 630am that was sharp/pressure like pain. Constant since that time. EKG with acute ischemia but freq  PVCs. Initial Troponin 0.04. Placed on IV heparin. Given location and bifurcation stent, would plan for relook cath as she is still within the window for thrombosis.  -- The patient understands that risks included but are not limited to stroke (1 in 1000), death (1 in 66), kidney failure [usually temporary] (1 in 500), bleeding (1 in 200), allergic reaction [possibly serious] (1 in 200).  -- continue IV heparin -- no nitro as she is on sildenafil -- ASA and plavix  2. Pulmonary HTN: will continue home medications including Opsumit and Revatio   3. HTN: blood pressures are elevated while in the ED.  -- continue coreg, may need further titration as she is slightly tachycardiac as well.  4. HL: on high dose statin -- check lipids  5. GERD: on PPI  6.  Fever  Now no longer low-grade fever of 100, now is 101 with definite symptoms of ongoing infectious etiology, cannot exclude influenza or other viral illness.  After this finding, the recommendation is for cardiology consultation and monitoring of her chest pain with potential plans for cath in the future pending resolution of febrile illness.  I have talked to Dr. Venora Maples in the emergency room who will contact Internal Medicine Hospitalist Service for admission.    Signed, Reino Bellis, NP  10/06/2018 10:32 AM   ATTENDING ATTESTATION  I have seen, examined and evaluated the patient this morning in the emergency room along with Reino Bellis, NP.  After reviewing all the available data and chart, we discussed the patients laboratory, study & physical findings as well as symptoms in detail. I agree with her findings, examination as well as impression recommendations as per our discussion.    Physical exam performed by me.  Very pleasant 85 year old woman with significant pulmonary pretension as well as hypertension and hyperlipidemia with coronary disease status post complex atherectomy based bifurcation LAD-DIAG PCI  last fall.  DES stents placed in the "kissing balloon" technique.  She now presents with symptoms that are somewhat concerning for unstable angina with onset of chest heaviness and pressure with dyspnea. Interestingly, she did not notice any of the symptoms while she was having her PCI.  However with ongoing chest pain, trivial troponin elevation and known recent PCI, I think the most prudent course of action is to proceed to the cardiac catheterization lab to exclude any stent related issues or any de novo lesions.  I spoke with Dr. Loralie Champagne.  He indicated that once she has had a procedure, the CHF team will happily take her out to their service since she is well-known to Dr. Aundra Dubin.     --ADDENDUM: With initial plans to go to the cardiac catheterization lab, she was due to be admitted to cardiology CHF service.  However, when called for to go to the Cath Lab, the patient noted feeling much worse, more fatigued, achy, nauseated.  Fever was now up to 101.  She still denies having any cough or cold type symptoms, just feeling weak and fatigued.  Some nausea but no vomiting.  With concerns now for some type of active ongoing infectious etiology, she would not be a good Cath Lab candidate at this time.  We will cancel cardiac  catheterization for now with plans to potentially reconsider in the future. Given the minimal troponin elevation and chest pain, agree with IV heparin and ruling out MI.    Glenetta Hew, M.D., M.S. Interventional Cardiologist   Pager # [phone] Phone # [phone] [address] [place], [place] [address]   For questions or updates, please contact [place] Please consult [email or web address] for contact info under      Signed, Glenetta Hew, MD  10/06/2018 2:13 PM

## 2018-10-06 NOTE — ED Notes (Addendum)
Pt again c/o increasing pain. Will give meds but cntact provider. Ptrovider paged

## 2018-10-06 NOTE — ED Notes (Addendum)
Admitting ON call MD contacted  And informed of continued and increasing chest pain now 6/10. MD states she will look at meds and condition. Will inform nurse on floor.

## 2018-10-06 NOTE — ED Notes (Signed)
ED TO INPATIENT HANDOFF REPORT  ED Nurse Name and Phone #: [phone]  S Name/Age/Gender [name] 85 y.o. female Room/Bed: 036C/036C  Code Status   Code Status: Full Code  Home/SNF/Other Home Patient oriented to: self, place, time and situation Is this baseline? Yes   Triage Complete: Triage complete  Chief Complaint chest pain  Triage Note Pt arrives [facility] from home where pt c/o waking 0630 with chest pain central radiating to left arm. Pt took aspirin 324 PTA. Pt arrives A&O x 4. Pt uses O2 4l/Levasy at all times. Pt state normal pulse ox 90-92.   Allergies Allergies  Allergen Reactions  . Sulfa Antibiotics Swelling    Mouth and tongue swelling    Level of Care/Admitting Diagnosis ED Disposition    ED Disposition Condition [facility] Area: [place] [[id number]]  Level of Care: Cardiac Telemetry [103]  Diagnosis: Chest pain [354656]  Admitting Physician: Rory Percy [[id number]]  Attending Physician: Andrena Mews T [[id number]]  Estimated length of stay: past midnight tomorrow  Certification:: I certify this patient will need inpatient services for at least 2 midnights  PT Class (Do Not Modify): Inpatient [101]  PT Acc Code (Do Not Modify): Private [1]       B Medical/Surgery History Past Medical History:  Diagnosis Date  . Anxiety   . Arthritis    osteo; "mostly hands, knees, probably back too" (12/09/2016)  . Chronic lower back pain   . Complication of anesthesia 2011   resp distress -on vent after surgery  . Coronary artery disease   . Depression   . Diabetes ([place])   . Esophageal stricture   . GERD (gastroesophageal reflux disease)   . Headache    out grew them  . High cholesterol   . History of blood transfusion 2011; ?03/2012   "related to ORs" (12/09/2016)  . Hypertension   . Intestinal obstruction ([place])   . Macular degeneration of left eye    dx over 55 yrs ago.....hasn't changed much  . Shingles   .  Squamous carcinoma 2013   squamas cell on scalp--took 14 radiation tx--2013  . Stroke [facility]) 1967   Mini stroke;  No lasting deficits  . Type II diabetes mellitus ([place]) dx'd 11/2016   Past Surgical History:  Procedure Laterality Date  . ABDOMINAL HYSTERECTOMY    . APPENDECTOMY    . BACK SURGERY    . CATARACT EXTRACTION, BILATERAL Bilateral   . CHOLECYSTECTOMY    . COLON SURGERY    . COLONOSCOPY    . CORONARY ATHERECTOMY N/A 05/15/2018   Procedure: CORONARY ATHERECTOMY;  Surgeon: Leonie Man, MD;  Location: [facility];  Service: Cardiovascular;  Laterality: N/A;  . CORONARY STENT INTERVENTION N/A 05/15/2018   Procedure: CORONARY STENT INTERVENTION;  Surgeon: Leonie Man, MD;  Location: [facility];  Service: Cardiovascular;  Laterality: N/A;  . ESOPHAGOGASTRODUODENOSCOPY (EGD) WITH ESOPHAGEAL DILATION  "several times"  . ESOPHAGOGASTRODUODENOSCOPY (EGD) WITH PROPOFOL N/A 12/31/2016   Procedure: ESOPHAGOGASTRODUODENOSCOPY (EGD) WITH PROPOFOL;  Surgeon: Wilford Corner, MD;  Location: [facility];  Service: Endoscopy;  Laterality: N/A;  . EYE SURGERY    . FRACTURE SURGERY    . IR FLUORO GUIDE CV LINE RIGHT  01/02/2017  . IR US GUIDE VASC ACCESS RIGHT  01/02/2017  . JOINT REPLACEMENT    . LUMBAR DISC SURGERY  10/25/2014   Right L4-L5 removal of seven free fragments of disk mostly posterolaterally.  Lysis of adhesion.  Microscope/notes 10/26/2014  . LUMBAR LAMINECTOMY/DECOMPRESSION MICRODISCECTOMY Right 08/09/2014   Procedure: Right Lumbar Four to Five Microdiskectomy;  Surgeon: Floyce Stakes, MD;  Location: [facility];  Service: Neurosurgery;  Laterality: Right;  Right L4-5 Microdiskectomy  . ORIF PERIPROSTHETIC FRACTURE  03/31/2012   Procedure: OPEN REDUCTION INTERNAL FIXATION (ORIF) PERIPROSTHETIC FRACTURE;  Surgeon: Mauri Pole, MD;  Location: [facility];  Service: Orthopedics;  Laterality: Left;  Open reduction internal fixation Left distal femur periprosthetic  fracture  . RIGHT/LEFT HEART CATH AND CORONARY ANGIOGRAPHY N/A 05/14/2018   Procedure: RIGHT/LEFT HEART CATH AND CORONARY ANGIOGRAPHY;  Surgeon: Larey Dresser, MD;  Location: [facility];  Service: Cardiovascular;  Laterality: N/A;  . SMALL INTESTINE SURGERY  2011   "really bad blockage; went into respiratory distress and was on ventilator after surgery; [facility]"  . SQUAMOUS CELL CARCINOMA EXCISION  ~ 2012   S/P "cut off her head then 15 radiation txs"   . TOTAL KNEE ARTHROPLASTY Bilateral    bilateral     A IV Location/Drains/Wounds Patient Lines/Drains/Airways Status   Active Line/Drains/Airways    Name:   Placement date:   Placement time:   Site:   Days:   Peripheral IV 10/06/18 Right Antecubital   10/06/18    0730    Antecubital   less than 1   Peripheral IV 10/06/18 Anterior;Left Forearm   10/06/18    1242    Forearm   less than 1          Intake/Output Last 24 hours  Intake/Output Summary (Last 24 hours) at 10/06/2018 1808 Last data filed at 10/06/2018 1320 Gross per 24 hour  Intake 70.98 ml  Output -  Net 70.98 ml    Labs/Imaging Results for orders placed or performed during the hospital encounter of 10/06/18 (from the past 48 hour(s))  CBC     Status: Abnormal   Collection Time: 10/06/18  7:29 AM  Result Value Ref Range   WBC 11.4 (H) 4.0 - 10.5 K/uL   RBC 4.08 3.87 - 5.11 MIL/uL   Hemoglobin 10.0 (L) 12.0 - 15.0 g/dL   HCT 35.4 (L) 36.0 - 46.0 %   MCV 86.8 80.0 - 100.0 fL   MCH 24.5 (L) 26.0 - 34.0 pg   MCHC 28.2 (L) 30.0 - 36.0 g/dL   RDW 17.7 (H) 11.5 - 15.5 %   Platelets 253 150 - 400 K/uL   nRBC 0.0 0.0 - 0.2 %    Comment: Performed at [facility], [address], [place],  [address]  Comprehensive metabolic panel     Status: Abnormal   Collection Time: 10/06/18  7:29 AM  Result Value Ref Range   Sodium 138 135 - 145 mmol/L   Potassium 3.8 3.5 - 5.1 mmol/L   Chloride 104 98 - 111 mmol/L   CO2 24 22 - 32 mmol/L    Glucose, Bld 198 (H) 70 - 99 mg/dL   BUN 19 8 - 23 mg/dL   Creatinine, Ser 0.84 0.44 - 1.00 mg/dL   Calcium 9.4 8.9 - 10.3 mg/dL   Total Protein 7.4 6.5 - 8.1 g/dL   Albumin 3.3 (L) 3.5 - 5.0 g/dL   AST 18 15 - 41 U/L   ALT 14 0 - 44 U/L   Alkaline Phosphatase 55 38 - 126 U/L   Total Bilirubin 0.4 0.3 - 1.2 mg/dL   GFR calc non Af Amer >60 >60 mL/min  GFR calc Af Amer >60 >60 mL/min   Anion gap 10 5 - 15    Comment: Performed at [facility] [address], [place], [place] [address]  Troponin I - ONCE - STAT     Status: Abnormal   Collection Time: 10/06/18  7:29 AM  Result Value Ref Range   Troponin I 0.04 (HH) <0.03 ng/mL    Comment: CRITICAL RESULT CALLED TO, READ BACK BY AND VERIFIED WITH: Thurmond Butts AT 1914 10/06/2018 BY L BENFIELD Performed at [facility], [place] [address] [place], [place] [address]   Lactic acid, plasma     Status: None   Collection Time: 10/06/18  7:29 AM  Result Value Ref Range   Lactic Acid, Venous 1.4 0.5 - 1.9 mmol/L    Comment: Performed at [facility] [address], [place], [place] [address]  Troponin I - ONCE - STAT     Status: Abnormal   Collection Time: 10/06/18 10:48 AM  Result Value Ref Range   Troponin I 0.04 (HH) <0.03 ng/mL    Comment: CRITICAL VALUE NOTED.  VALUE IS CONSISTENT WITH PREVIOUSLY REPORTED AND CALLED VALUE. Performed at [facility], [place] [address], [place], [place] [address]   Influenza panel by PCR (type A & B)     Status: None   Collection Time: 10/06/18 11:20 AM  Result Value Ref Range   Influenza A By PCR NEGATIVE NEGATIVE   Influenza B By PCR NEGATIVE NEGATIVE    Comment: (NOTE) The Xpert Xpress Flu assay is intended as an aid in the diagnosis of  influenza and should not be used as a sole basis for treatment.  This  assay is FDA approved for nasopharyngeal swab specimens only. Nasal  washings and aspirates are unacceptable for Xpert Xpress Flu testing. Performed at [facility], [place] [address], [place], [place] [address]   Urinalysis, Routine w reflex microscopic     Status: None   Collection Time: 10/06/18  4:00 PM  Result Value Ref Range   Color, Urine YELLOW YELLOW   APPearance CLEAR CLEAR   Specific Gravity, Urine 1.013 1.005 - 1.030   pH 7.0 5.0 - 8.0   Glucose, UA NEGATIVE NEGATIVE mg/dL   Hgb urine dipstick NEGATIVE NEGATIVE   Bilirubin Urine NEGATIVE NEGATIVE   Ketones, ur NEGATIVE NEGATIVE mg/dL   Protein, ur NEGATIVE NEGATIVE mg/dL   Nitrite NEGATIVE NEGATIVE   Leukocytes,Ua NEGATIVE NEGATIVE    Comment: Performed at [facility] [address], [place], [place] [address]   Dg Chest Portable 1 View  Result Date: 10/06/2018 CLINICAL DATA:  Chest pain radiating into both arms today. EXAM: PORTABLE CHEST 1 VIEW COMPARISON:  Single-view of the chest 05/19/2018. PA and lateral chest 05/18/2018. FINDINGS: There is cardiomegaly died edema. Atherosclerosis noted. Mild bibasilar subsegmental atelectasis is seen. No pneumothorax or pleural effusion. IMPRESSION: Cardiomegaly without acute disease. Atherosclerosis. Bibasilar subsegmental atelectasis. Electronically Signed   By: Inge Rise M.D.   On: 10/06/2018 08:59    Pending Labs Unresulted Labs (From admission, onward)    Start     Ordered   10/07/18 0500  Heparin level (unfractionated)  Daily,   R     10/06/18 0919   10/07/18 0500  CBC  Daily,   R     10/06/18 0919   10/07/18 1324  Basic metabolic panel  Tomorrow morning,   R     10/06/18 1057  10/07/18 0500  Lipid panel  Tomorrow morning,   R     10/06/18 1057   10/07/18 0500  Brain natriuretic peptide  Tomorrow morning,   R     10/06/18 1604   10/06/18 1700  Heparin level (unfractionated)  Once-Timed,   R     10/06/18 0919   10/06/18 1646  Respiratory Panel by PCR  (Respiratory virus panel with precautions)  Once,   R     10/06/18 1648   10/06/18 1608  Culture, Urine  Once,   R     10/06/18 1607   10/06/18 1057  Troponin I - Now  Then Q6H  Now then every 6 hours,   STAT     10/06/18 1057   10/06/18 0723  Blood culture (routine x 2)  BLOOD CULTURE X 2,   STAT     10/06/18 0723   Signed and Held  Magnesium  Tomorrow morning,   R     Signed and Held   Signed and Held  TSH  Tomorrow morning,   R     Signed and Held   Signed and Held  Hemoglobin A1c  Tomorrow morning,   R     Signed and Held          Vitals/Pain Today's Vitals   10/06/18 1700 10/06/18 1714 10/06/18 1730 10/06/18 1800  BP: (!) 152/77  (!) 149/81 (!) 147/63  Pulse: 90  86 88  Resp: 19  19 (!) 29  Temp:  100.1 F (37.8 C)    TempSrc:  Oral    SpO2: 90%  92% 94%  Weight:      Height:      PainSc:  3       Isolation Precautions Droplet precaution  Medications Medications  heparin ADULT infusion 100 units/mL (25000 units/224m sodium chloride 0.45%) (800 Units/hr Intravenous Rate/Dose Verify 10/06/18 1716)  aspirin chewable tablet 324 mg (324 mg Oral Not Given 10/06/18 1100)    Or  aspirin suppository 300 mg ( Rectal See Alternative 10/06/18 1100)  aspirin EC tablet 81 mg (has no administration in time range)  acetaminophen (TYLENOL) tablet 650 mg (650 mg Oral Given 10/06/18 1115)  ondansetron (ZOFRAN) injection 4 mg (has no administration in time range)  0.9% sodium chloride infusion (0 mL/kg/hr  67.6 kg Intravenous Stopped 10/06/18 1320)    Followed by  0.9% sodium chloride infusion (1 mL/kg/hr  67.6 kg Intravenous Not Given 10/06/18 1450)  clopidogrel (PLAVIX) tablet 75 mg (75 mg Oral Given 10/06/18 1715)  morphine 2 MG/ML injection 2 mg (2 mg Intravenous Given 10/06/18 1546)  ceFEPIme (MAXIPIME) 2 g in sodium chloride 0.9 % 100 mL IVPB (has no administration in time range)  vancomycin (VANCOCIN) 1,250 mg in sodium chloride 0.9 % 250 mL IVPB (has no administration in time range)  vancomycin (VANCOCIN) 500 mg in sodium chloride 0.9 % 100 mL IVPB (has no administration in time range)  alum & mag hydroxide-simeth (MAALOX/MYLANTA) 200-200-20 MG/5ML  suspension 30 mL (has no administration in time range)  morphine 4 MG/ML injection 4 mg (4 mg Intravenous Given 10/06/18 0801)  heparin bolus via infusion 3,800 Units (3,800 Units Intravenous Bolus from Bag 10/06/18 0911)  ceFEPIme (MAXIPIME) 2 g in sodium chloride 0.9 % 100 mL IVPB (0 g Intravenous Stopped 10/06/18 1538)    Mobility walks with device Moderate fall risk   Focused Assessments Cardiac Assessment Handoff:  Cardiac Rhythm: Normal sinus rhythm Lab Results  Component Value Date  CKTOTAL 94 03/30/2012   TROPONINI 0.04 (HH) 10/06/2018   No results found for: DDIMER Does the Patient currently have chest pain? Yes     R Recommendations: See Admitting Provider Note  Report given to:   Additional Notes:  Pt was initially going to cath but fever and her pain changed plan.

## 2018-10-06 NOTE — ED Notes (Signed)
Iv team at bedside  

## 2018-10-06 NOTE — Progress Notes (Signed)
ANTICOAGULATION CONSULT NOTE - Initial Consult  Pharmacy Consult for heparin Indication: chest pain/ACS  Allergies  Allergen Reactions  . Sulfa Antibiotics Swelling    Mouth and tongue swelling    Patient Measurements: Height: _0  (157.5 cm) Weight: 149 lb (67.6 kg) IBW/kg (Calculated) : 50.1 Heparin Dosing Weight: 64.1kg   Vital Signs: Temp: 100.1 F (37.8 C) (03/03 1714) Temp Source: Oral (03/03 1714) BP: 147/63 (03/03 1800) Pulse Rate: 88 (03/03 1800)  Labs: Recent Labs    10/06/18 0729 10/06/18 1048 10/06/18 1800  HGB 10.0*  --   --   HCT 35.4*  --   --   PLT 253  --   --   HEPARINUNFRC  --   --  0.16*  CREATININE 0.84  --   --   TROPONINI 0.04* 0.04*  --     Estimated Creatinine Clearance: 44.9 mL/min (by C-G formula based on SCr of 0.84 mg/dL).   Medical History: Past Medical History:  Diagnosis Date  . Anxiety   . Arthritis    osteo; "mostly hands, knees, probably back too" (12/09/2016)  . Chronic lower back pain   . Complication of anesthesia 2011   resp distress -on vent after surgery  . Coronary artery disease   . Depression   . Diabetes ([place])   . Esophageal stricture   . GERD (gastroesophageal reflux disease)   . Headache    out grew them  . High cholesterol   . History of blood transfusion 2011; ?03/2012   "related to ORs" (12/09/2016)  . Hypertension   . Intestinal obstruction ([place])   . Macular degeneration of left eye    dx over 55 yrs ago.....hasn't changed much  . Shingles   . Squamous carcinoma 2013   squamas cell on scalp--took 14 radiation tx--2013  . Stroke [facility]) 1967   Mini stroke;  No lasting deficits  . Type II diabetes mellitus ([place]) dx'd 11/2016    Medications:  Infusions:  . sodium chloride    . [START ON 10/07/2018] ceFEPime (MAXIPIME) IV    . heparin 800 Units/hr (10/06/18 1716)  . [START ON 10/08/2018] vancomycin    . vancomycin 500 mg (10/06/18 1822)    Assessment: 88 yof presented to the ED with CP. Troponin is  elevated and now starting IV heparin. Hgb is slightly low and platelets are WNL. She is not on anticoagulation PTA.   Heparin level subtherapeutic at 0.16, no stoppages or line issues per RN report.    Goal of Therapy:  Heparin level 0.3-0.7 units/ml Monitor platelets by anticoagulation protocol: Yes   Plan:  Heparin 2000 units IV x 1 and increase gtt to 1000 units/hr F/u 8 hour heparin level  Bertis Ruddy, PharmD Clinical Pharmacist Please check AMION for all [place] numbers 10/06/2018 6:32 PM

## 2018-10-06 NOTE — ED Notes (Signed)
Dr. Ellyn Hack at bedside. States to decrease fluids to kvo. Same done at 20 cc/h

## 2018-10-06 NOTE — ED Notes (Signed)
Dr. Venora Maples notified of troponin

## 2018-10-06 NOTE — Consult Note (Signed)
Advanced Heart Failure Team Consult Note   Primary Physician: [name], PA-C PCP-Cardiologist:  Jenkins Rouge, MD  Reason for Consultation: Heart Failure   HPI:    [name] is seen today for evaluation of heart failure at the request of Dr Ellyn Hack.  85 y.o. with history of DMII, HTN, GERD, HTN, prior osteomyelitis of jaw pulmonary hypertension/RV failure, and CAD.   She was referred to cardiology, saw Dr. Johnsie Cancel. Amlodipine was stopped and she was started empirically on sildenafil 20 mg tid for pulmonary hypertension.   She was referred to CHF clinic.    RHC/LHC was done in 10/19, showing complex severe bifurcation lesion involving the proximal LAD and large D2 ostium. She had complex PCI requiring atherectomy and DES to pLAD and ostial D2. RHC showed PAH with PVR 11.6 WU and CI 1.7.  She subsequently was found to have a right groin pseudoaneurysm from cath.   She was started on Opsumit.  Serologies have suggested CREST syndrome.  She missed appointment with Dr. Amil Amen.   Yesterday she presented to the [facility] with chest pain that started earlier in the morning and woke her up from sleep. Placed on heparin drip. She was not placed on nitro due to sildenafil use (for [facility]). Cardiology consulted with plans for [facility], but delayed due to fever. She had a fever with temp >101.  Blood CX obtained. Flu negative. She was started on Vanc and Cefepime.   Troponin trend flat 0.04 -> 0.05 -> 0.04. BNP elevated 1399. K 3.3 and Mag 1.6 this am. WBC normal 8.7. No fevers overnight. SBP elevated 140-160s  She feels a lot better today. Denies any further CP. Denies SOB getting up to [facility]. At home, weights have been stable. Good UOP with lasix 20 mg daily. SOB has been at baseline. Asking if she is still going to need cath.    05/2018 LHC  1. Normal filling pressures.  2. Moderate pulmonary arterial hypertension with markedly elevated PVR.  Cardiac output is low.  3. Complicated, severe  calcified stenosis at bifurcation of proximal LAD and large D2.  There is severe stenosis in both LAD and diagonal.  05/15/2018  Followed by successful bifurcation orbital atherectomy based PCI of LAD-D2 -with DK (double kiss) technique.  Synergy DES 2.75 mm x 8 mm ostial D2 (3.1 mm)  Synergy DES 3.0 mm x 20 mm mid LAD crossing D2 (3.4 mm)  02/2018 Echo 45-50%   Review of systems complete and found to be negative unless listed in HPI.   Home Medications Prior to Admission medications   Medication Sig Start Date End Date Taking? Authorizing Provider  ALPRAZolam Duanne Moron) 0.25 MG tablet Take 0.25 mg by mouth daily as needed for anxiety.    Yes [provider]  aspirin EC 81 MG tablet Take 1 tablet (81 mg total) by mouth daily. 05/16/18  Yes Bhagat, Bhavinkumar, PA  atorvastatin (LIPITOR) 80 MG tablet Take 1 tablet (80 mg total) by mouth daily. 05/16/18  Yes Bhagat, Bhavinkumar, PA  carvedilol (COREG) 6.25 MG tablet Take 1 tablet (6.25 mg total) by mouth 2 (two) times daily with a meal. 08/31/18  Yes Larey Dresser, MD  chlorhexidine (PERIDEX) 0.12 % solution 15 mLs by Mouth Rinse route 2 (two) times daily as needed (sore gums).  05/05/18  Yes [provider]  clopidogrel (PLAVIX) 75 MG tablet Take 1 tablet (75 mg total) by mouth daily. 05/16/18  Yes Bhagat, Bhavinkumar, PA  cyclobenzaprine (FLEXERIL) 10 MG  tablet Take 1 tablet (10 mg total) by mouth 3 (three) times daily as needed for muscle spasms. Patient taking differently: Take 10 mg by mouth 2 (two) times daily.  10/21/14  Yes Leonard Schwartz, MD  furosemide (LASIX) 20 MG tablet Take 1 tablet (20 mg total) by mouth daily. 05/22/18  Yes Shirley Friar, PA-C  gabapentin (NEURONTIN) 300 MG capsule Take 300 mg by mouth at bedtime.    Yes [provider]  lactose free nutrition (BOOST) LIQD Take 237 mLs by mouth 2 (two) times daily between meals.    Yes [provider]  macitentan (OPSUMIT) 10 MG  tablet Take 1 tablet (10 mg total) by mouth daily. Will need appointment with Dr. Aundra Dubin for further refills Patient taking differently: Take 10 mg by mouth daily.  05/16/18  Yes Bhagat, Bhavinkumar, PA  mirtazapine (REMERON) 30 MG tablet Take 30 mg by mouth at bedtime.   Yes [provider]  pantoprazole (PROTONIX) 40 MG tablet Take 1 tablet (40 mg total) by mouth 2 (two) times daily. Patient taking differently: Take 40 mg by mouth daily.  01/02/17  Yes Ghimire, Henreitta Leber, MD  potassium chloride SA (K-DUR,KLOR-CON) 20 MEQ tablet Take 1 tablet (20 mEq total) by mouth daily. 05/08/18  Yes Larey Dresser, MD  sildenafil (REVATIO) 20 MG tablet Take 1 tablet (20 mg total) by mouth 3 (three) times daily. 03/06/18  Yes Josue Hector, MD  traMADol-acetaminophen (ULTRACET) 37.5-325 MG tablet Take 2 tablets by mouth 4 (four) times daily as needed for pain. 05/07/18  Yes [provider]    Past Medical History: Past Medical History:  Diagnosis Date  . Anxiety   . Arthritis    osteo; "mostly hands, knees, probably back too" (12/09/2016)  . Chronic lower back pain   . Complication of anesthesia 2011   resp distress -on vent after surgery  . Coronary artery disease   . Depression   . Diabetes ([place])   . Esophageal stricture   . GERD (gastroesophageal reflux disease)   . Headache    out grew them  . High cholesterol   . History of blood transfusion 2011; ?03/2012   "related to ORs" (12/09/2016)  . Hypertension   . Intestinal obstruction ([place])   . Macular degeneration of left eye    dx over 55 yrs ago.....hasn't changed much  . Shingles   . Squamous carcinoma 2013   squamas cell on scalp--took 14 radiation tx--2013  . Stroke [facility]) 1967   Mini stroke;  No lasting deficits  . Type II diabetes mellitus ([place]) dx'd 11/2016    Past Surgical History: Past Surgical History:  Procedure Laterality Date  . ABDOMINAL HYSTERECTOMY    . APPENDECTOMY    . BACK SURGERY    . CATARACT  EXTRACTION, BILATERAL Bilateral   . CHOLECYSTECTOMY    . COLON SURGERY    . COLONOSCOPY    . CORONARY ATHERECTOMY N/A 05/15/2018   Procedure: CORONARY ATHERECTOMY;  Surgeon: Leonie Man, MD;  Location: [place] [facility];  Service: Cardiovascular;  Laterality: N/A;  . CORONARY STENT INTERVENTION N/A 05/15/2018   Procedure: CORONARY STENT INTERVENTION;  Surgeon: Leonie Man, MD;  Location: [place] [facility];  Service: Cardiovascular;  Laterality: N/A;  . ESOPHAGOGASTRODUODENOSCOPY (EGD) WITH ESOPHAGEAL DILATION  "several times"  . ESOPHAGOGASTRODUODENOSCOPY (EGD) WITH PROPOFOL N/A 12/31/2016   Procedure: ESOPHAGOGASTRODUODENOSCOPY (EGD) WITH PROPOFOL;  Surgeon: Wilford Corner, MD;  Location: [place];  Service: Endoscopy;  Laterality: N/A;  .  EYE SURGERY    . FRACTURE SURGERY    . IR FLUORO GUIDE CV LINE RIGHT  01/02/2017  . IR US GUIDE VASC ACCESS RIGHT  01/02/2017  . JOINT REPLACEMENT    . LUMBAR DISC SURGERY  10/25/2014   Right L4-L5 removal of seven free fragments of disk mostly posterolaterally.  Lysis of adhesion.  Microscope/notes 10/26/2014  . LUMBAR LAMINECTOMY/DECOMPRESSION MICRODISCECTOMY Right 08/09/2014   Procedure: Right Lumbar Four to Five Microdiskectomy;  Surgeon: Floyce Stakes, MD;  Location: [facility];  Service: Neurosurgery;  Laterality: Right;  Right L4-5 Microdiskectomy  . ORIF PERIPROSTHETIC FRACTURE  03/31/2012   Procedure: OPEN REDUCTION INTERNAL FIXATION (ORIF) PERIPROSTHETIC FRACTURE;  Surgeon: Mauri Pole, MD;  Location: [facility];  Service: Orthopedics;  Laterality: Left;  Open reduction internal fixation Left distal femur periprosthetic fracture  . RIGHT/LEFT HEART CATH AND CORONARY ANGIOGRAPHY N/A 05/14/2018   Procedure: RIGHT/LEFT HEART CATH AND CORONARY ANGIOGRAPHY;  Surgeon: Larey Dresser, MD;  Location: [place] [facility];  Service: Cardiovascular;  Laterality: N/A;  . SMALL INTESTINE SURGERY  2011   "really bad blockage; went into  respiratory distress and was on ventilator after surgery; [facility]"  . SQUAMOUS CELL CARCINOMA EXCISION  ~ 2012   S/P "cut off her head then 15 radiation txs"   . TOTAL KNEE ARTHROPLASTY Bilateral    bilateral    Family History: No family history on file.  Social History: Social History   Socioeconomic History  . Marital status: Widowed    Spouse name: Not on file  . Number of children: Not on file  . Years of education: Not on file  . Highest education level: Not on file  Occupational History  . Not on file  Social Needs  . Financial resource strain: Not on file  . Food insecurity:    Worry: Not on file    Inability: Not on file  . Transportation needs:    Medical: Not on file    Non-medical: Not on file  Tobacco Use  . Smoking status: Former Smoker    Packs/day: 0.10    Years: 10.00    Pack years: 1.00    Types: Cigarettes    Last attempt to quit: 03/31/1991    Years since quitting: 27.5  . Smokeless tobacco: Never Used  Substance and Sexual Activity  . Alcohol use: No  . Drug use: No  . Sexual activity: Never  Lifestyle  . Physical activity:    Days per week: Not on file    Minutes per session: Not on file  . Stress: Not on file  Relationships  . Social connections:    Talks on phone: Not on file    Gets together: Not on file    Attends religious service: Not on file    Active member of club or organization: Not on file    Attends meetings of clubs or organizations: Not on file    Relationship status: Not on file  Other Topics Concern  . Not on file  Social History Narrative  . Not on file    Allergies:  Allergies  Allergen Reactions  . Sulfa Antibiotics Swelling    Mouth and tongue swelling    Objective:    Vital Signs:   Temp:  [98.1 F (36.7 C)-101.2 F (38.4 C)] 98.1 F (36.7 C) (03/04 0432) Pulse Rate:  [55-100] 84 (03/04 1001) Resp:  [8-29] 20 (03/04 1001) BP: (139-185)/(46-109) 139/46 (03/04 1001) SpO2:  [90 %-97 %]  97 %  (03/04 1001) Weight:  [66 kg-66.5 kg] 66 kg (03/04 0433) Last BM Date: 10/05/18  Weight change: Filed Weights   10/06/18 0719 10/06/18 1949 10/07/18 0433  Weight: 67.6 kg 66.5 kg 66 kg    Intake/Output:   Intake/Output Summary (Last 24 hours) at 10/07/2018 1020 Last data filed at 10/07/2018 1000 Gross per 24 hour  Intake 313.98 ml  Output 1300 ml  Net -986.02 ml      Physical Exam    General:  Elderly. No resp difficulty HEENT: normal Neck: supple. JVP to ear. Carotids 2+ bilat; no bruits. No lymphadenopathy or thyromegaly appreciated. Cor: PMI nondisplaced. Regular rate & rhythm. No rubs, gallops or murmurs. Lungs: crackles in bases.  Abdomen: soft, nontender, nondistended. No hepatosplenomegaly. No bruits or masses. Good bowel sounds. Extremities: no cyanosis, clubbing, rash, edema Neuro: alert & orientedx3, cranial nerves grossly intact. moves all 4 extremities w/o difficulty. Affect pleasant   Telemetry   NSR 80s with PVCs. Personally reviewed.   EKG   NSR 86 bpm with RBBB and PVCs. TWI in V1-V4 (new from EKG 05/2018). Personally reviewed.   Labs   Basic Metabolic Panel: Recent Labs  Lab 10/06/18 0729 10/07/18 0203  NA 138 140  K 3.8 3.3*  CL 104 106  CO2 24 25  GLUCOSE 198* 169*  BUN 19 14  CREATININE 0.84 0.73  CALCIUM 9.4 9.1  MG  --  1.6*    Liver Function Tests: Recent Labs  Lab 10/06/18 0729  AST 18  ALT 14  ALKPHOS 55  BILITOT 0.4  PROT 7.4  ALBUMIN 3.3*   No results for input(s): LIPASE, AMYLASE in the last 168 hours. No results for input(s): AMMONIA in the last 168 hours.  CBC: Recent Labs  Lab 10/06/18 0729 10/07/18 0203  WBC 11.4* 8.7  HGB 10.0* 9.7*  HCT 35.4* 33.2*  MCV 86.8 83.6  PLT 253 251    Cardiac Enzymes: Recent Labs  Lab 10/06/18 0729 10/06/18 1048 10/06/18 1800 10/07/18 0203  TROPONINI 0.04* 0.04* 0.05* 0.04*    BNP: BNP (last 3 results) Recent Labs    05/08/18 1201 10/07/18 0203  BNP 1,105.0*  1,399.3*    ProBNP (last 3 results) No results for input(s): PROBNP in the last 8760 hours.   CBG: Recent Labs  Lab 10/07/18 0217  GLUCAP 148*    Coagulation Studies: No results for input(s): LABPROT, INR in the last 72 hours.   Imaging   No results found.   Medications:     Current Medications: . aspirin  324 mg Oral NOW   Or  . aspirin  300 mg Rectal NOW  . aspirin EC  81 mg Oral Daily  . atorvastatin  80 mg Oral Daily  . carvedilol  6.25 mg Oral BID WC  . clopidogrel  75 mg Oral Daily  . furosemide  20 mg Oral Daily  . gabapentin  300 mg Oral QHS  . macitentan  10 mg Oral Daily  . mirtazapine  30 mg Oral QHS  . pantoprazole  40 mg Oral Daily  . sildenafil  20 mg Oral TID  . sodium chloride flush  3 mL Intravenous Q12H    Infusions: . sodium chloride    . sodium chloride    . ceFEPime (MAXIPIME) IV    . heparin 1,000 Units/hr (10/07/18 0204)  . magnesium sulfate 1 - 4 g bolus IVPB 2 g (10/07/18 1008)  . [START ON 10/08/2018] vancomycin  Patient Profile  85 y.o. with history of DMII, HTN, GERD, HTN, prior osteomyelitis of jaw pulmonary hypertension/RV failure, and CAD.   Admitted with chest pain and fever  Assessment/Plan   1. Chest Pain , known CAD LHC 05/2018 showed complex bifurcation lesion with 95% stenosis proximal LAD with calcification at take-off of large D2. The ostial/proximal D2 also had 95% calcified stenosis. Suspect this lesion played a role in her exertional dyspnea and also in her mildly decreased LV systolic function (EF 56-71% on echo).  S/P successful atherectomy with bifurcation stenting of proximal LAD and D2 05/15/18. - Troponin trend flat.  - EKG with new TWI in V1-V4.  - LHC delayed with fever. Remains on heparin drip. Will discuss with MD if she still needs repeat LHC.  - No longer having CP.  - No imdur with sildenafil use.   2. Fever - Tmax >101. WBC 11 on admit. Blood cultures drawn and pending. Started on  Vanc/Cefepime. Afebrile overnight. WBC now normal.  - CXR was negative for infiltrates. UA negative. Flu negative. - SBP elevated, HR stable, O2 requirement at baseline. She does not appear septic to me.   3. PAH  She appearedto have at least moderate pulmonary hypertension by echo in 7/19 with RV failure. CT chest showed mild emphysema, which should not explain her degree of RV failure. V/Q scan did not show evidence for chronic PEs.Anti-centromere antibody, ANA, and RF were all positive. ?CREST variant of scleroderma.[place] 05/15/18 showed moderate PAH but very high PVR and low cardiac output. This is concerning for advanced pulmonary hypertension. - Continue sildenafil 20 mg three times a day + 10 mg opsumit daily.  - Continue 4 L O2 (home dose)  4. Chronic Diastolic HF/RV Failure - Echo 10/06/18 EF 55-60%, RV moderately reduced - Volume elevated on exam. BNP elevated.  - Start 60 mg IV lasix BID. She is on 20 mg lasix daily at home. Will give extra K today  5. Suspect CREST syndrome  - Needs to follow up with Dr Amil Amen.   6. Hypokalemia/hypomagnesium - K 3.3. Mag 1.6. Supplement. Recheck tomorrow am.    Length of Stay: Dickson, NP  10/07/2018, 10:20 AM  Advanced Heart Failure Team Pager [phone] (M-F; 7a - 4p)  Please contact [facility] for night-coverage after hours (4p -7a ) and weekends on [email or web address]

## 2018-10-06 NOTE — ED Notes (Signed)
Paged cards per RN

## 2018-10-06 NOTE — Progress Notes (Signed)
  Echocardiogram 2D Echocardiogram has been performed.  [name] 10/06/2018, 2:01 PM

## 2018-10-06 NOTE — ED Provider Notes (Addendum)
[facility] EMERGENCY DEPARTMENT Provider Note   CSN: [id number] Arrival date & time: 10/06/18  3403    History   Chief Complaint Chief Complaint  Patient presents with  . Chest Pain    HPI [name] is a 85 y.o. female.     HPI A 85 year old patient with a history of treated diabetes, hypertension and hypercholesterolemia presents for evaluation of chest pain. Initial onset of pain was approximately 3-6 hours ago. The patient's chest pain is described as heaviness/pressure/tightness, is sharp and is not worse with exertion. The patient's chest pain is middle- or left-sided, is not well-localized and does radiate to the arms/jaw/neck. The patient does not complain of nausea and denies diaphoresis. The patient has a prior hx of stroke.  Patient received Neri stent in October 2019.  She is compliant with her medications including her Plavix.   Past Medical History:  Diagnosis Date  . Anxiety   . Arthritis    osteo; "mostly hands, knees, probably back too" (12/09/2016)  . Chronic lower back pain   . Complication of anesthesia 2011   resp distress -on vent after surgery  . Coronary artery disease   . Depression   . Diabetes ([place])   . Esophageal stricture   . GERD (gastroesophageal reflux disease)   . Headache    out grew them  . High cholesterol   . History of blood transfusion 2011; ?03/2012   "related to ORs" (12/09/2016)  . Hypertension   . Intestinal obstruction ([place])   . Macular degeneration of left eye    dx over 55 yrs ago.....hasn't changed much  . Shingles   . Squamous carcinoma 2013   squamas cell on scalp--took 14 radiation tx--2013  . Stroke [facility]) 1967   Mini stroke;  No lasting deficits  . Type II diabetes mellitus ([place]) dx'd 11/2016    Patient Active Problem List   Diagnosis Date Noted  . 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 Pulmonary hypertension, unspecified ([place])   . Status post  coronary artery stent placement   . Coronary artery disease involving native coronary artery of native heart with angina pectoris ([place])   . Exertional dyspnea 05/14/2018  . Abnormal CT scan, gastrointestinal tract 12/31/2016  . GI bleed 12/31/2016  . HLD (hyperlipidemia) 12/09/2016  . GERD (gastroesophageal reflux disease) 12/09/2016  . Depression 12/09/2016  . History of CVA (cerebrovascular accident) 12/09/2016  . CKD (chronic kidney disease), stage III ([place]) 12/09/2016  . Back pain 12/09/2016  . Sepsis ([place]) 12/09/2016  . History of oral surgery 12/09/2016  . Osteomyelitis of jaw 12/09/2016  . Diabetes mellitus type 2, uncontrolled ([place]) 12/09/2016  . History of Lumbar herniated disc 08/09/2014  . UTI (lower urinary tract infection) 04/08/2012  . Pneumonia 04/04/2012  . Acute blood loss anemia 04/01/2012  . Fall at home 03/31/2012  . History of shingles 03/31/2012  . Anemia 03/31/2012  . Femur fracture, left ([place]) 03/31/2012  . Hypertension 03/30/2012    Past Surgical History:  Procedure Laterality Date  . ABDOMINAL HYSTERECTOMY    . APPENDECTOMY    . BACK SURGERY    . CATARACT EXTRACTION, BILATERAL Bilateral   . CHOLECYSTECTOMY    . COLON SURGERY    . COLONOSCOPY    . CORONARY ATHERECTOMY N/A 05/15/2018   Procedure: CORONARY ATHERECTOMY;  Surgeon: Leonie Man, MD;  Location: [facility] CV LAB;  Service: Cardiovascular;  Laterality: N/A;  . CORONARY STENT INTERVENTION N/A 05/15/2018  Procedure: CORONARY STENT INTERVENTION;  Surgeon: Leonie Man, MD;  Location: [facility] [place] CV [facility];  Service: Cardiovascular;  Laterality: N/A;  . ESOPHAGOGASTRODUODENOSCOPY (EGD) WITH ESOPHAGEAL DILATION  "several times"  . ESOPHAGOGASTRODUODENOSCOPY (EGD) WITH PROPOFOL N/A 12/31/2016   Procedure: ESOPHAGOGASTRODUODENOSCOPY (EGD) WITH PROPOFOL;  Surgeon: Wilford Corner, MD;  Location: [facility];  Service: Endoscopy;  Laterality: N/A;  . EYE SURGERY    . FRACTURE SURGERY    .  IR FLUORO GUIDE CV LINE RIGHT  01/02/2017  . IR US GUIDE VASC ACCESS RIGHT  01/02/2017  . JOINT REPLACEMENT    . LUMBAR DISC SURGERY  10/25/2014   Right L4-L5 removal of seven free fragments of disk mostly posterolaterally.  Lysis of adhesion.  Microscope/notes 10/26/2014  . LUMBAR LAMINECTOMY/DECOMPRESSION MICRODISCECTOMY Right 08/09/2014   Procedure: Right Lumbar Four to Five Microdiskectomy;  Surgeon: Floyce Stakes, MD;  Location: [facility];  Service: Neurosurgery;  Laterality: Right;  Right L4-5 Microdiskectomy  . ORIF PERIPROSTHETIC FRACTURE  03/31/2012   Procedure: OPEN REDUCTION INTERNAL FIXATION (ORIF) PERIPROSTHETIC FRACTURE;  Surgeon: Mauri Pole, MD;  Location: [facility];  Service: Orthopedics;  Laterality: Left;  Open reduction internal fixation Left distal femur periprosthetic fracture  . RIGHT/LEFT HEART CATH AND CORONARY ANGIOGRAPHY N/A 05/14/2018   Procedure: RIGHT/LEFT HEART CATH AND CORONARY ANGIOGRAPHY;  Surgeon: Larey Dresser, MD;  Location: [facility] CV LAB;  Service: Cardiovascular;  Laterality: N/A;  . SMALL INTESTINE SURGERY  2011   "really bad blockage; went into respiratory distress and was on ventilator after surgery; [facility]"  . SQUAMOUS CELL CARCINOMA EXCISION  ~ 2012   S/P "cut off her head then 15 radiation txs"   . TOTAL KNEE ARTHROPLASTY Bilateral    bilateral     OB History   No obstetric history on file.      Home Medications    Prior to Admission medications   Medication Sig Start Date End Date Taking? Authorizing Provider  ALPRAZolam Duanne Moron) 0.25 MG tablet Take 0.25 mg by mouth daily as needed for anxiety.     [provider]  aspirin EC 81 MG tablet Take 1 tablet (81 mg total) by mouth daily. 05/16/18   Bhagat, Crista Luria, PA  atorvastatin (LIPITOR) 80 MG tablet Take 1 tablet (80 mg total) by mouth daily. 05/16/18   Bhagat, Crista Luria, PA  carvedilol (COREG) 6.25 MG tablet Take 1 tablet (6.25 mg total) by mouth 2 (two)  times daily with a meal. 08/31/18   Larey Dresser, MD  chlorhexidine (PERIDEX) 0.12 % solution 15 mLs by Mouth Rinse route 2 (two) times daily as needed (sore gums).  05/05/18   [provider]  clopidogrel (PLAVIX) 75 MG tablet Take 1 tablet (75 mg total) by mouth daily. 05/16/18   Bhagat, Crista Luria, PA  cyclobenzaprine (FLEXERIL) 10 MG tablet Take 1 tablet (10 mg total) by mouth 3 (three) times daily as needed for muscle spasms. Patient taking differently: Take 10 mg by mouth 2 (two) times daily.  10/21/14   Leonard Schwartz, MD  furosemide (LASIX) 20 MG tablet Take 1 tablet (20 mg total) by mouth daily. 05/22/18   Shirley Friar, PA-C  gabapentin (NEURONTIN) 300 MG capsule Take 300 mg by mouth at bedtime.     [provider]  lactose free nutrition (BOOST) LIQD Take 237 mLs by mouth 2 (two) times daily between meals.     [provider]  macitentan (OPSUMIT) 10 MG tablet Take 1 tablet (10 mg total)  by mouth daily. Will need appointment with Dr. Aundra Dubin for further refills 05/16/18   Leanor Kail, PA  mirtazapine (REMERON) 30 MG tablet Take 30 mg by mouth at bedtime.    [provider]  pantoprazole (PROTONIX) 40 MG tablet Take 1 tablet (40 mg total) by mouth 2 (two) times daily. Patient taking differently: Take 40 mg by mouth daily.  01/02/17   Ghimire, Henreitta Leber, MD  potassium chloride SA (K-DUR,KLOR-CON) 20 MEQ tablet Take 1 tablet (20 mEq total) by mouth daily. 05/08/18   Larey Dresser, MD  Selexipag 200 & 800 MCG TBPK Take by mouth. 08/27/18   Larey Dresser, MD  sildenafil (REVATIO) 20 MG tablet Take 1 tablet (20 mg total) by mouth 3 (three) times daily. 03/06/18   Josue Hector, MD  traMADol-acetaminophen (ULTRACET) 37.5-325 MG tablet Take 2 tablets by mouth 4 (four) times daily as needed for pain. 05/07/18   [provider]    Family History No family history on file.  Social History Social History   Tobacco Use  .  Smoking status: Former Smoker    Packs/day: 0.10    Years: 10.00    Pack years: 1.00    Types: Cigarettes    Last attempt to quit: 03/31/1991    Years since quitting: 27.5  . Smokeless tobacco: Never Used  Substance Use Topics  . Alcohol use: No  . Drug use: No     Allergies   Sulfa antibiotics   Review of Systems Review of Systems  All other systems reviewed and are negative.    Physical Exam Updated Vital Signs BP (!) 156/81   Pulse 90   Temp 100.3 F (37.9 C) (Oral)   Resp (!) 22   Ht _0  (1.575 m)   Wt 67.6 kg   SpO2 95%   BMI 27.25 kg/m   Physical Exam Vitals signs and nursing note reviewed.  Constitutional:      General: She is not in acute distress.    Appearance: She is well-developed.  HENT:     Head: Normocephalic and atraumatic.  Neck:     Musculoskeletal: Normal range of motion.  Cardiovascular:     Rate and Rhythm: Normal rate and regular rhythm.     Heart sounds: Normal heart sounds.  Pulmonary:     Effort: Pulmonary effort is normal.     Breath sounds: Normal breath sounds.  Abdominal:     General: There is no distension.     Palpations: Abdomen is soft.     Tenderness: There is no abdominal tenderness.  Musculoskeletal: Normal range of motion.  Skin:    General: Skin is warm and dry.  Neurological:     Mental Status: She is alert and oriented to person, place, and time.  Psychiatric:        Judgment: Judgment normal.      ED Treatments / Results  Labs (all labs ordered are listed, but only abnormal results are displayed) Labs Reviewed  CBC - Abnormal; Notable for the following components:      Result Value   WBC 11.4 (*)    Hemoglobin 10.0 (*)    HCT 35.4 (*)    MCH 24.5 (*)    MCHC 28.2 (*)    RDW 17.7 (*)    All other components within normal limits  COMPREHENSIVE METABOLIC PANEL - Abnormal; Notable for the following components:   Glucose, Bld 198 (*)    Albumin 3.3 (*)  All other components within normal limits    TROPONIN I - Abnormal; Notable for the following components:   Troponin I 0.04 (*)    All other components within normal limits  CULTURE, BLOOD (ROUTINE X 2)  CULTURE, BLOOD (ROUTINE X 2)  LACTIC ACID, PLASMA  URINALYSIS, ROUTINE W REFLEX MICROSCOPIC  HEPARIN LEVEL (UNFRACTIONATED)    EKG EKG Interpretation  Date/Time:  Tuesday October 06 2018 07:18:01 EST Ventricular Rate:  91 PR Interval:    QRS Duration: 127 QT Interval:  373 QTC Calculation: 459 R Axis:   86 Text Interpretation:  Sinus rhythm Multiple ventricular premature complexes Right bundle branch block nonspecific ST and T wave changes as compared to prior ecg Confirmed by Jola Schmidt [phone]) on 10/06/2018 7:22:15 AM   Radiology No results found.  Procedures .Critical Care Performed by: Jola Schmidt, MD Authorized by: Jola Schmidt, MD   Critical care provider statement:    Critical care time (minutes):  33   Critical care was time spent personally by me on the following activities:  Discussions with consultants, evaluation of patient's response to treatment, examination of patient, ordering and performing treatments and interventions, ordering and review of laboratory studies, ordering and review of radiographic studies, pulse oximetry, re-evaluation of patient's condition, obtaining history from patient or surrogate and review of old charts   (including critical care time)  Medications Ordered in ED Medications  morphine 4 MG/ML injection 4 mg (4 mg Intravenous Given 10/06/18 0801)     Initial Impression / Assessment and Plan / ED Course  I have reviewed the triage vital signs and the nursing notes.  Pertinent labs & imaging results that were available during my care of the patient were reviewed by me and considered in my medical decision making (see chart for details).        History concerning for unstable angina giving waxing and waning chest discomfort with radiation to both arms.  History of known  coronary artery disease.  Troponin 0 0.04.  Temp of the 100.3 orally.  No significant infectious symptoms however.  Still with some mild ongoing discomfort at this time.  Initiation of a heparin drip.  Morphine.  Unable to receive nitrates given her sildenafil use for pulmonary hypertension.  Doubt DVT and pulmonary embolism.  Doubt dissection.  Cardiology consultation will be obtained for admission to the hospital and serial troponin.  She may benefit from heart catheterization on an urgent basis if her chest discomfort continues and enzymes continue to rise  9:34 AM Spoke with cardiology who will evaluate at the bedside  10:51 AM Fever now up to 101.2.  Influenza panel will be ordered.  Patient without any significant infectious type symptoms.  No recent upper respiratory symptoms.  No GI complaints.  No urinary complaints.  Denies abdominal pain.  No signs of cellulitis  Final Clinical Impressions(s) / ED Diagnoses   Final diagnoses:  Unstable angina [facility])    ED Discharge Orders    None       Jola Schmidt, MD 10/06/18 0569    Jola Schmidt, MD 10/06/18 1052

## 2018-10-07 DIAGNOSIS — I5033 Acute on chronic diastolic (congestive) heart failure: Secondary | ICD-10-CM

## 2018-10-07 LAB — BASIC METABOLIC PANEL
ANION GAP: 9 (ref 5–15)
BUN: 14 mg/dL (ref 8–23)
CO2: 25 mmol/L (ref 22–32)
Calcium: 9.1 mg/dL (ref 8.9–10.3)
Chloride: 106 mmol/L (ref 98–111)
Creatinine, Ser: 0.73 mg/dL (ref 0.44–1.00)
GFR calc Af Amer: >60 mL/min (ref >60)
GFR calc non Af Amer: >60 mL/min (ref >60)
Glucose, Bld: 169 mg/dL — ABNORMAL HIGH (ref 70–99)
Potassium: 3.3 mmol/L — ABNORMAL LOW (ref 3.5–5.1)
SODIUM: 140 mmol/L (ref 135–145)

## 2018-10-07 LAB — CBC
HCT: 33.2 % — ABNORMAL LOW (ref 36.0–46.0)
Hemoglobin: 9.7 g/dL — ABNORMAL LOW (ref 12.0–15.0)
MCH: 24.4 pg — ABNORMAL LOW (ref 26.0–34.0)
MCHC: 29.2 g/dL — ABNORMAL LOW (ref 30.0–36.0)
MCV: 83.6 fL (ref 80.0–100.0)
NRBC: 0 % (ref 0.0–0.2)
Platelets: 251 10*3/uL (ref 150–400)
RBC: 3.97 MIL/uL (ref 3.87–5.11)
RDW: 17.5 % — ABNORMAL HIGH (ref 11.5–15.5)
WBC: 8.7 10*3/uL (ref 4.0–10.5)

## 2018-10-07 LAB — URINE CULTURE: Culture: <10000 — AB

## 2018-10-07 LAB — HEPARIN LEVEL (UNFRACTIONATED)
Heparin Unfractionated: 0.31 IU/mL (ref 0.30–0.70)
Heparin Unfractionated: 0.31 IU/mL (ref 0.30–0.70)

## 2018-10-07 LAB — GLUCOSE, CAPILLARY: Glucose-Capillary: 148 mg/dL — ABNORMAL HIGH (ref 70–99)

## 2018-10-07 LAB — TROPONIN I: Troponin I: 0.04 ng/mL (ref <0.03)

## 2018-10-07 LAB — BRAIN NATRIURETIC PEPTIDE: B Natriuretic Peptide: 1399.3 pg/mL — ABNORMAL HIGH (ref 0.0–100.0)

## 2018-10-07 LAB — HEMOGLOBIN A1C
HEMOGLOBIN A1C: 7.3 % — AB (ref 4.8–5.6)
Mean Plasma Glucose: 162.81 mg/dL

## 2018-10-07 LAB — LIPID PANEL
Cholesterol: 88 mg/dL (ref 0–200)
HDL: 32 mg/dL — ABNORMAL LOW (ref >40)
LDL Cholesterol: 33 mg/dL (ref 0–99)
Total CHOL/HDL Ratio: 2.8 RATIO
Triglycerides: 113 mg/dL (ref <150)
VLDL: 23 mg/dL (ref 0–40)

## 2018-10-07 LAB — TSH: TSH: 0.876 u[IU]/mL (ref 0.350–4.500)

## 2018-10-07 LAB — MAGNESIUM: Magnesium: 1.6 mg/dL — ABNORMAL LOW (ref 1.7–2.4)

## 2018-10-07 MED ORDER — POTASSIUM CHLORIDE CRYS ER 20 MEQ PO TBCR
60.0000 meq | EXTENDED_RELEASE_TABLET | Freq: Once | ORAL | Status: AC
  Administered 2018-10-07: 60 meq via ORAL
  Filled 2018-10-07: qty 3

## 2018-10-07 MED ORDER — ALPRAZOLAM 0.25 MG PO TABS
0.2500 mg | ORAL_TABLET | Freq: Every day | ORAL | Status: DC | PRN
  Administered 2018-10-07: 0.25 mg via ORAL
  Filled 2018-10-07: qty 1

## 2018-10-07 MED ORDER — MAGNESIUM SULFATE 2 GM/50ML IV SOLN
2.0000 g | Freq: Once | INTRAVENOUS | Status: AC
  Administered 2018-10-07: 2 g via INTRAVENOUS
  Filled 2018-10-07: qty 50

## 2018-10-07 MED ORDER — POTASSIUM CHLORIDE CRYS ER 20 MEQ PO TBCR
40.0000 meq | EXTENDED_RELEASE_TABLET | Freq: Once | ORAL | Status: AC
  Administered 2018-10-07: 40 meq via ORAL
  Filled 2018-10-07: qty 2

## 2018-10-07 MED ORDER — FUROSEMIDE 10 MG/ML IJ SOLN
60.0000 mg | Freq: Two times a day (BID) | INTRAMUSCULAR | Status: DC
  Administered 2018-10-07 – 2018-10-08 (×3): 60 mg via INTRAVENOUS
  Filled 2018-10-07 (×3): qty 6

## 2018-10-07 MED ORDER — FUROSEMIDE 10 MG/ML IJ SOLN: 60.0000 mg | Freq: Two times a day (BID) | INTRAMUSCULAR | Status: DC

## 2018-10-07 NOTE — Consult Note (Signed)
Advanced Heart Failure Team Consult Note   Primary Physician: [name], PA-C PCP-Cardiologist:  Jenkins Rouge, MD  Reason for Consultation: Heart Failure   HPI:    [name] is seen today for evaluation of heart failure at the request of Dr Ellyn Hack.  85 y.o. with history of DMII, HTN, GERD, HTN, prior osteomyelitis of jaw pulmonary hypertension/RV failure, and CAD.   She was referred to cardiology, saw Dr. Johnsie Cancel. Amlodipine was stopped and she was started empirically on sildenafil 20 mg tid for pulmonary hypertension.   She was referred to CHF clinic.    RHC/LHC was done in 10/19, showing complex severe bifurcation lesion involving the proximal LAD and large D2 ostium. She had complex PCI requiring atherectomy and DES to pLAD and ostial D2. RHC showed PAH with PVR 11.6 WU and CI 1.7.  She subsequently was found to have a right groin pseudoaneurysm from cath.   She was started on Opsumit.  Serologies have suggested CREST syndrome.  She missed appointment with Dr. Amil Amen.   Yesterday she presented to the [facility] with chest pain that started earlier in the morning and woke her up from sleep. Placed on heparin drip. She was not placed on nitro due to sildenafil use (for [facility]). Cardiology consulted with plans for [facility], but delayed due to fever. She had a fever with temp >101.  Blood CX obtained. Flu negative. She was started on Vanc and Cefepime.   Troponin trend flat 0.04 -> 0.05 -> 0.04. BNP elevated 1399. K 3.3 and Mag 1.6 this am. WBC normal 8.7. No fevers overnight. SBP elevated 140-160s  She feels a lot better today. Denies any further CP. Denies SOB getting up to [facility]. At home, weights have been stable. Good UOP with lasix 20 mg daily. SOB has been at baseline. Asking if she is still going to need cath.    05/2018 LHC  1. Normal filling pressures.  2. Moderate pulmonary arterial hypertension with markedly elevated PVR.  Cardiac output is low.  3. Complicated, severe  calcified stenosis at bifurcation of proximal LAD and large D2.  There is severe stenosis in both LAD and diagonal.  05/15/2018  Followed by successful bifurcation orbital atherectomy based PCI of LAD-D2 -with DK (double kiss) technique.  Synergy DES 2.75 mm x 8 mm ostial D2 (3.1 mm)  Synergy DES 3.0 mm x 20 mm mid LAD crossing D2 (3.4 mm)  02/2018 Echo 45-50%   Review of systems complete and found to be negative unless listed in HPI.   Home Medications Prior to Admission medications   Medication Sig Start Date End Date Taking? Authorizing Provider  ALPRAZolam Duanne Moron) 0.25 MG tablet Take 0.25 mg by mouth daily as needed for anxiety.    Yes [provider]  aspirin EC 81 MG tablet Take 1 tablet (81 mg total) by mouth daily. 05/16/18  Yes Bhagat, Bhavinkumar, PA  atorvastatin (LIPITOR) 80 MG tablet Take 1 tablet (80 mg total) by mouth daily. 05/16/18  Yes Bhagat, Bhavinkumar, PA  carvedilol (COREG) 6.25 MG tablet Take 1 tablet (6.25 mg total) by mouth 2 (two) times daily with a meal. 08/31/18  Yes Larey Dresser, MD  chlorhexidine (PERIDEX) 0.12 % solution 15 mLs by Mouth Rinse route 2 (two) times daily as needed (sore gums).  05/05/18  Yes [provider]  clopidogrel (PLAVIX) 75 MG tablet Take 1 tablet (75 mg total) by mouth daily. 05/16/18  Yes Bhagat, Bhavinkumar, PA  cyclobenzaprine (FLEXERIL) 10 MG  tablet Take 1 tablet (10 mg total) by mouth 3 (three) times daily as needed for muscle spasms. Patient taking differently: Take 10 mg by mouth 2 (two) times daily.  10/21/14  Yes Leonard Schwartz, MD  furosemide (LASIX) 20 MG tablet Take 1 tablet (20 mg total) by mouth daily. 05/22/18  Yes Shirley Friar, PA-C  gabapentin (NEURONTIN) 300 MG capsule Take 300 mg by mouth at bedtime.    Yes [provider]  lactose free nutrition (BOOST) LIQD Take 237 mLs by mouth 2 (two) times daily between meals.    Yes [provider]  macitentan (OPSUMIT) 10 MG  tablet Take 1 tablet (10 mg total) by mouth daily. Will need appointment with Dr. Aundra Dubin for further refills Patient taking differently: Take 10 mg by mouth daily.  05/16/18  Yes Bhagat, Bhavinkumar, PA  mirtazapine (REMERON) 30 MG tablet Take 30 mg by mouth at bedtime.   Yes [provider]  pantoprazole (PROTONIX) 40 MG tablet Take 1 tablet (40 mg total) by mouth 2 (two) times daily. Patient taking differently: Take 40 mg by mouth daily.  01/02/17  Yes Ghimire, Henreitta Leber, MD  potassium chloride SA (K-DUR,KLOR-CON) 20 MEQ tablet Take 1 tablet (20 mEq total) by mouth daily. 05/08/18  Yes Larey Dresser, MD  sildenafil (REVATIO) 20 MG tablet Take 1 tablet (20 mg total) by mouth 3 (three) times daily. 03/06/18  Yes Josue Hector, MD  traMADol-acetaminophen (ULTRACET) 37.5-325 MG tablet Take 2 tablets by mouth 4 (four) times daily as needed for pain. 05/07/18  Yes [provider]    Past Medical History: Past Medical History:  Diagnosis Date  . Anxiety   . Arthritis    osteo; "mostly hands, knees, probably back too" (12/09/2016)  . Chronic lower back pain   . Complication of anesthesia 2011   resp distress -on vent after surgery  . Coronary artery disease   . Depression   . Diabetes ([place])   . Esophageal stricture   . GERD (gastroesophageal reflux disease)   . Headache    out grew them  . High cholesterol   . History of blood transfusion 2011; ?03/2012   "related to ORs" (12/09/2016)  . Hypertension   . Intestinal obstruction ([place])   . Macular degeneration of left eye    dx over 55 yrs ago.....hasn't changed much  . Shingles   . Squamous carcinoma 2013   squamas cell on scalp--took 14 radiation tx--2013  . Stroke [facility]) 1967   Mini stroke;  No lasting deficits  . Type II diabetes mellitus ([place]) dx'd 11/2016    Past Surgical History: Past Surgical History:  Procedure Laterality Date  . ABDOMINAL HYSTERECTOMY    . APPENDECTOMY    . BACK SURGERY    . CATARACT  EXTRACTION, BILATERAL Bilateral   . CHOLECYSTECTOMY    . COLON SURGERY    . COLONOSCOPY    . CORONARY ATHERECTOMY N/A 05/15/2018   Procedure: CORONARY ATHERECTOMY;  Surgeon: Leonie Man, MD;  Location: [place] [facility];  Service: Cardiovascular;  Laterality: N/A;  . CORONARY STENT INTERVENTION N/A 05/15/2018   Procedure: CORONARY STENT INTERVENTION;  Surgeon: Leonie Man, MD;  Location: [place] [facility];  Service: Cardiovascular;  Laterality: N/A;  . ESOPHAGOGASTRODUODENOSCOPY (EGD) WITH ESOPHAGEAL DILATION  "several times"  . ESOPHAGOGASTRODUODENOSCOPY (EGD) WITH PROPOFOL N/A 12/31/2016   Procedure: ESOPHAGOGASTRODUODENOSCOPY (EGD) WITH PROPOFOL;  Surgeon: Wilford Corner, MD;  Location: [place];  Service: Endoscopy;  Laterality: N/A;  .  EYE SURGERY    . FRACTURE SURGERY    . IR FLUORO GUIDE CV LINE RIGHT  01/02/2017  . IR US GUIDE VASC ACCESS RIGHT  01/02/2017  . JOINT REPLACEMENT    . LUMBAR DISC SURGERY  10/25/2014   Right L4-L5 removal of seven free fragments of disk mostly posterolaterally.  Lysis of adhesion.  Microscope/notes 10/26/2014  . LUMBAR LAMINECTOMY/DECOMPRESSION MICRODISCECTOMY Right 08/09/2014   Procedure: Right Lumbar Four to Five Microdiskectomy;  Surgeon: Floyce Stakes, MD;  Location: [facility];  Service: Neurosurgery;  Laterality: Right;  Right L4-5 Microdiskectomy  . ORIF PERIPROSTHETIC FRACTURE  03/31/2012   Procedure: OPEN REDUCTION INTERNAL FIXATION (ORIF) PERIPROSTHETIC FRACTURE;  Surgeon: Mauri Pole, MD;  Location: [facility];  Service: Orthopedics;  Laterality: Left;  Open reduction internal fixation Left distal femur periprosthetic fracture  . RIGHT/LEFT HEART CATH AND CORONARY ANGIOGRAPHY N/A 05/14/2018   Procedure: RIGHT/LEFT HEART CATH AND CORONARY ANGIOGRAPHY;  Surgeon: Larey Dresser, MD;  Location: [place] [facility];  Service: Cardiovascular;  Laterality: N/A;  . SMALL INTESTINE SURGERY  2011   "really bad blockage; went into  respiratory distress and was on ventilator after surgery; [facility]"  . SQUAMOUS CELL CARCINOMA EXCISION  ~ 2012   S/P "cut off her head then 15 radiation txs"   . TOTAL KNEE ARTHROPLASTY Bilateral    bilateral    Family History: No family history on file.  Social History: Social History   Socioeconomic History  . Marital status: Widowed    Spouse name: Not on file  . Number of children: Not on file  . Years of education: Not on file  . Highest education level: Not on file  Occupational History  . Not on file  Social Needs  . Financial resource strain: Not on file  . Food insecurity:    Worry: Not on file    Inability: Not on file  . Transportation needs:    Medical: Not on file    Non-medical: Not on file  Tobacco Use  . Smoking status: Former Smoker    Packs/day: 0.10    Years: 10.00    Pack years: 1.00    Types: Cigarettes    Last attempt to quit: 03/31/1991    Years since quitting: 27.5  . Smokeless tobacco: Never Used  Substance and Sexual Activity  . Alcohol use: No  . Drug use: No  . Sexual activity: Never  Lifestyle  . Physical activity:    Days per week: Not on file    Minutes per session: Not on file  . Stress: Not on file  Relationships  . Social connections:    Talks on phone: Not on file    Gets together: Not on file    Attends religious service: Not on file    Active member of club or organization: Not on file    Attends meetings of clubs or organizations: Not on file    Relationship status: Not on file  Other Topics Concern  . Not on file  Social History Narrative  . Not on file    Allergies:  Allergies  Allergen Reactions  . Sulfa Antibiotics Swelling    Mouth and tongue swelling    Objective:    Vital Signs:   Temp:  [98.1 F (36.7 C)-100.2 F (37.9 C)] 98.1 F (36.7 C) (03/04 0432) Pulse Rate:  [55-96] 79 (03/04 1250) Resp:  [8-29] 17 (03/04 1250) BP: (139-170)/(46-109) 139/46 (03/04 1001) SpO2:  [90 %-97 %]  93 %  (03/04 1250) Weight:  [66 kg-66.5 kg] 66 kg (03/04 0433) Last BM Date: 10/05/18  Weight change: Filed Weights   10/06/18 0719 10/06/18 1949 10/07/18 0433  Weight: 67.6 kg 66.5 kg 66 kg    Intake/Output:   Intake/Output Summary (Last 24 hours) at 10/07/2018 1311 Last data filed at 10/07/2018 1000 Gross per 24 hour  Intake 313.98 ml  Output 1300 ml  Net -986.02 ml      Physical Exam    General:  Elderly. No resp difficulty HEENT: normal Neck: supple. JVP to ear. Carotids 2+ bilat; no bruits. No lymphadenopathy or thyromegaly appreciated. Cor: PMI nondisplaced. Regular rate & rhythm. No rubs, gallops or murmurs. Lungs: crackles in bases.  Abdomen: soft, nontender, nondistended. No hepatosplenomegaly. No bruits or masses. Good bowel sounds. Extremities: no cyanosis, clubbing, rash, edema Neuro: alert & orientedx3, cranial nerves grossly intact. moves all 4 extremities w/o difficulty. Affect pleasant   Telemetry   NSR 80s with PVCs. Personally reviewed.   EKG   NSR 86 bpm with RBBB and PVCs. TWI in V1-V4 (new from EKG 05/2018). Personally reviewed.   Labs   Basic Metabolic Panel: Recent Labs  Lab 10/06/18 0729 10/07/18 0203  NA 138 140  K 3.8 3.3*  CL 104 106  CO2 24 25  GLUCOSE 198* 169*  BUN 19 14  CREATININE 0.84 0.73  CALCIUM 9.4 9.1  MG  --  1.6*    Liver Function Tests: Recent Labs  Lab 10/06/18 0729  AST 18  ALT 14  ALKPHOS 55  BILITOT 0.4  PROT 7.4  ALBUMIN 3.3*   No results for input(s): LIPASE, AMYLASE in the last 168 hours. No results for input(s): AMMONIA in the last 168 hours.  CBC: Recent Labs  Lab 10/06/18 0729 10/07/18 0203  WBC 11.4* 8.7  HGB 10.0* 9.7*  HCT 35.4* 33.2*  MCV 86.8 83.6  PLT 253 251    Cardiac Enzymes: Recent Labs  Lab 10/06/18 0729 10/06/18 1048 10/06/18 1800 10/07/18 0203  TROPONINI 0.04* 0.04* 0.05* 0.04*    BNP: BNP (last 3 results) Recent Labs    05/08/18 1201 10/07/18 0203  BNP 1,105.0*  1,399.3*    ProBNP (last 3 results) No results for input(s): PROBNP in the last 8760 hours.   CBG: Recent Labs  Lab 10/07/18 0217  GLUCAP 148*    Coagulation Studies: No results for input(s): LABPROT, INR in the last 72 hours.   Imaging   No results found.   Medications:     Current Medications: . aspirin EC  81 mg Oral Daily  . atorvastatin  80 mg Oral Daily  . carvedilol  6.25 mg Oral BID WC  . clopidogrel  75 mg Oral Daily  . furosemide  60 mg Intravenous BID  . gabapentin  300 mg Oral QHS  . macitentan  10 mg Oral Daily  . mirtazapine  30 mg Oral QHS  . pantoprazole  40 mg Oral Daily  . potassium chloride  60 mEq Oral Once  . sildenafil  20 mg Oral TID  . sodium chloride flush  3 mL Intravenous Q12H    Infusions: . sodium chloride    . sodium chloride    . heparin 1,000 Units/hr (10/07/18 1119)      Patient Profile  85 y.o. with history of DMII, HTN, GERD, HTN, prior osteomyelitis of jaw pulmonary hypertension/RV failure, and CAD.   Admitted with chest pain and fever  Assessment/Plan   1. Chest Pain ,  known CAD LHC 05/2018 showed complex bifurcation lesion with 95% stenosis proximal LAD with calcification at take-off of large D2. The ostial/proximal D2 also had 95% calcified stenosis. Suspect this lesion played a role in her exertional dyspnea and also in her mildly decreased LV systolic function (EF 66-44% on echo).  S/P successful atherectomy with bifurcation stenting of proximal LAD and D2 05/15/18. - Troponin trend flat.  - EKG with new TWI in V1-V4.  - LHC delayed with fever. Remains on heparin drip. Will discuss with MD if she still needs repeat LHC.  - No longer having CP.  - No imdur with sildenafil use.   2. Fever - Tmax >101. WBC 11 on admit. Blood cultures drawn and pending. Started on Vanc/Cefepime. Afebrile overnight. WBC now normal.  - CXR was negative for infiltrates. UA negative. Flu negative. - SBP elevated, HR stable, O2  requirement at baseline. She does not appear septic to me.   3. PAH  She appearedto have at least moderate pulmonary hypertension by echo in 7/19 with RV failure. CT chest showed mild emphysema, which should not explain her degree of RV failure. V/Q scan did not show evidence for chronic PEs.Anti-centromere antibody, ANA, and RF were all positive. ?CREST variant of scleroderma.[place] 05/15/18 showed moderate PAH but very high PVR and low cardiac output. This is concerning for advanced pulmonary hypertension. - Continue sildenafil 20 mg three times a day + 10 mg opsumit daily.  - Continue 4 L O2 (home dose)  4. Chronic Diastolic HF/RV Failure - Echo 10/06/18 EF 55-60%, RV moderately reduced - Volume elevated on exam. BNP elevated.  - Start 60 mg IV lasix BID. She is on 20 mg lasix daily at home. Will give extra K today  5. Suspect CREST syndrome  - Needs to follow up with Dr Amil Amen.   6. Hypokalemia/hypomagnesium - K 3.3. Mag 1.6. Supplement. Recheck tomorrow am.    Length of Stay: 1  Lillia Mountain, NP 10/07/2018  Advanced Heart Failure Team Pager [phone] (M-F; 7a - 4p)  Please contact [facility] for night-coverage after hours (4p -7a ) and weekends on [email or web address]  Patient seen with NP, agree with the above note.   No further chest pain. Troponin not significantly elevated.  BNP high.  Chest XR without active disease. Nonspecific changes on ECG compared to prior.   Echo was done showing EF 55-60%, mildly dilated RV with moderately decreased systolic function. PASP 64 mmHg.   On exam, JVP 12 cm.  Mild crackles at bases.  Regular S1S2, no S3.  No edema.   1. Febrile illness: Admitted with T > 101 and chest pain.  WBCs 8.7 today.  Cultures negative so far, no definite PNA on CXR.  Now afebrile. Respiratory virus panel negative.  Not currently on abx, got vancomycin and cefepime in ER.  - Per primary service.  2. CAD: Coronary angiography in 10/19 showed complex bifurcation  lesion with 95% stenosis proximal LAD with calcification at take-off of large D2. The ostial/proximal D2 also had 95% calcified stenosis. Suspect this lesion played a role in her exertional dyspnea and also in her mildly decreased LV systolic function (EF 95-63% on echo).  S/p successful atherectomy with bifurcation stenting of proximal LAD and D2 05/15/18. She had chest pain at admission, very mild TnI elevation (0.05) without trend, nonspecific ECG changes.  Given prolonged CP and no significant rise in TnI, doubt ACS.  Possible demand ischemia with volume overload.  Echo this admission with EF 55-60%.   -  Continue ASA 81 and Plavix. - Continue atorvastatin, good lipids this admission.  - I will arrange for Lexiscan Cardiolite tomorrow to rule out significant ischemia.  3.Pulmonary hypertension: She appearedto have at least moderate pulmonary hypertension by echo in 7/19 with RV failure. CT chest showed mild emphysema, which should not explain her degree of RV failure. V/Q scan did not show evidence for chronic PEs.Anti-centromere antibody, ANA, and RF were all positive. ?CREST variant of scleroderma.She remains quite symptomatic and is on home oxygen.[place] 05/15/18 showed moderate PAH but very high PVR and low cardiac output. This is concerning for advanced pulmonary hypertension.   - Continued on sildenafil20 mg TID.  - Continue Opsumit 10 mg daily.  - She is awaiting selexipag (has not arrived at her house yet).    - Refuses sleep study.  4. Acute on chronic diastolic CHF/RV failure:  Echo with EF 55-60%, moderately decreased RV systolic function.  On exam, she is volume overloaded.  Mild TnI elevation is likely demand ischemia from volume overload.  - Lasix 60 mg IV bid today and follow I/Os closely.  5. Suspect CREST syndrome: She missed her appointment with rheumatology, Dr. Amil Amen.  I will refer her to him again.   Loralie Champagne 10/07/2018 1:26 PM

## 2018-10-07 NOTE — Progress Notes (Signed)
[place] for heparin Indication: chest pain/ACS  Allergies  Allergen Reactions  . Sulfa Antibiotics Swelling    Mouth and tongue swelling    Patient Measurements: Height: _0  (157.5 cm) Weight: 146 lb 11.2 oz (66.5 kg) IBW/kg (Calculated) : 50.1 Heparin Dosing Weight: 64.1kg   Vital Signs: Temp: 98.5 F (36.9 C) (03/03 2333) Temp Source: Oral (03/03 2333) BP: 153/98 (03/03 2333) Pulse Rate: 96 (03/03 2333)  Labs: Recent Labs    10/06/18 0729 10/06/18 1048 10/06/18 1800 10/07/18 0203  HGB 10.0*  --   --  9.7*  HCT 35.4*  --   --  33.2*  PLT 253  --   --  251  HEPARINUNFRC  --   --  0.16* 0.31  CREATININE 0.84  --   --   --   TROPONINI 0.04* 0.04* 0.05*  --     Estimated Creatinine Clearance: 44.6 mL/min (by C-G formula based on SCr of 0.84 mg/dL).     Medications:  Infusions:  . sodium chloride    . sodium chloride    . ceFEPime (MAXIPIME) IV    . heparin 1,000 Units/hr (10/07/18 0204)  . [START ON 10/08/2018] vancomycin      Assessment: 58 yof presented to the ED with CP. Troponin is elevated and now starting IV heparin. Hgb is slightly low and platelets are WNL. She is not on anticoagulation PTA.   Heparin level 0.31 units/ml  Goal of Therapy:  Heparin level 0.3-0.7 units/ml Monitor platelets by anticoagulation protocol: Yes   Plan:  Continue heparin at 1000 units/hr F/u 6-8 hour heparin level to confirm  Excell Seltzer, PharmD Clinical Pharmacist Please check AMION for all [place] numbers 10/07/2018 3:30 AM

## 2018-10-07 NOTE — Progress Notes (Signed)
ANTICOAGULATION CONSULT NOTE - Follow Up Consult  Pharmacy Consult for Heparin Indication: chest pain/ACS  Allergies  Allergen Reactions  . Sulfa Antibiotics Swelling    Mouth and tongue swelling    Patient Measurements: Height: 5' 2" (157.5 cm) Weight: 145 lb 6.4 oz (66 kg) IBW/kg (Calculated) : 50.1 Heparin Dosing Weight: 63.8 kg  Vital Signs: Temp: 98.1 F (36.7 C) (03/04 0432) Temp Source: Oral (03/04 0432) BP: 139/46 (03/04 1001) Pulse Rate: 80 (03/04 1320)  Labs: Recent Labs    10/06/18 0729 10/06/18 1048 10/06/18 1800 10/07/18 0203 10/07/18 0940  HGB 10.0*  --   --  9.7*  --   HCT 35.4*  --   --  33.2*  --   PLT 253  --   --  251  --   HEPARINUNFRC  --   --  0.16* 0.31 0.31  CREATININE 0.84  --   --  0.73  --   TROPONINI 0.04* 0.04* 0.05* 0.04*  --     Estimated Creatinine Clearance: 46.7 mL/min (by C-G formula based on SCr of 0.73 mg/dL).   Medications:  Scheduled:  . aspirin EC  81 mg Oral Daily  . atorvastatin  80 mg Oral Daily  . carvedilol  6.25 mg Oral BID WC  . clopidogrel  75 mg Oral Daily  . furosemide  60 mg Intravenous BID  . gabapentin  300 mg Oral QHS  . macitentan  10 mg Oral Daily  . mirtazapine  30 mg Oral QHS  . pantoprazole  40 mg Oral Daily  . sildenafil  20 mg Oral TID  . sodium chloride flush  3 mL Intravenous Q12H   Infusions:  . sodium chloride    . sodium chloride    . heparin 1,000 Units/hr (10/07/18 1119)    Assessment: 85 yo F admitted with chest pain.  Pt initiated on heparin.  Pt is currently therapeutic on 1000 units/hr.  Plans for stress test 3/5.  Goal of Therapy:  Heparin level 0.3-0.7 units/ml Monitor platelets by anticoagulation protocol: Yes   Plan:  Continue heparin at 1000 units/hr. Next heparin level and CBC with AM labs 3/5.  [employer], Pharm.D., BCPS Clinical Pharmacist  **Pharmacist phone directory can now be found on [email or web address] ([email or web address]).  Listed under [place].  10/07/2018 2:59  PM

## 2018-10-07 NOTE — Progress Notes (Addendum)
Family Medicine Teaching Service Daily Progress Note Intern Pager: [phone]  Patient name: [name] Medical record number: [id number] Date of birth: May 26, 1932 Age: 85 y.o. Gender: female  Primary Care Provider: [name], PA-C Consultants: Cardiology Code Status: DNR  Pt Overview and Major Events to Date:  3/3 - admitted for chest pain, ACS r/o, plans for LHC  Assessment and Plan: [name] is a 85 y.o. female presenting with chest pain radiating to the shoulders and back. PMH is significant for CAD s/p stents, pulmonary HTN, GERD, HTN, HLD, GERD, Hx CVA, Depression, CKD III, h/o Osteomyelitis of Jaw, ?T2DM.  Chest Pain 2/2 Unstable Angina, h/o CAD s/p stent, CHF NYHA III Troponins stable, 0.04 x3. EKG concerning for inferiorlateral ischemia, RBBB with freq PVCs. Cardiology following with initial plan for catheterization however will work up infectious etiology first (see below). CXR on admission with cardiomegaly and slight blunting of costophrenic angles but no appreciable effusions or consolidation. Remained on heparin drip overnight. BNP elevated to 1399. Chest pain resolved, still with bibasilar rales, no LE edema. IVC also dilated on ECHO suggesting fluid overload. Was given GI cocktail last night so may have also contributed to pain. TSH and lipid panel wnl, A1c 7.3. ECHO with improved EF from prior. -Continue morphine for pain control. Patient can not take nitro as she is on sildenafil. -Continue IV heparin -Continue ASA and plavix -Appreciate cardiology input - zofran prn - oxygen therapy as needed, on 4L O2 at home per daughter - lasix per Cards - cath vs stress per Cards  Fever Unclear source. Patient is afebrile since 5pm 3/3 and no longer has elevated leukocytosis. Continued on Vanc/Cefepime overnight. Remains with bibasilar rales but could be 2/2 CHF rather than true infection/PNA. Influenza A and B negative. Urinalysis unremarkable. RVP negative. Blood  culture no growth <24hrs.  -Awaiting final urine and blood culture - d/c antibiotics and watch for refever with low threshold to restart - cath vs stress per Cards  Pulmonary HTN, chronic Followed by Dr. Aundra Dubin with HF. Most recent estimated PA pressure 61 mmHg, L/RHC 05/2018. Underwent rheumatological work up concerning for possible CREST syndrome. On opsumit, sildenafil at home. Able to walk 16f at baseline without SOB.  -will continue home medication of opsumit and sildenafil.  HTN, chronic  Blood pressure is elevated [id number]. On coreg, lasix at home.  - Continue coreg, may increase dose but will wait for BP response to increased lasix.  HLD Lipid panel from this AM WNL, on high dose statin - continue lipitor  GERD May contribute to chest pain as above. -Continue PPI  Normocytic Anemia Hb 10 on admission (stable from 1 month ago), currently is 9.7. Baseline ~9-10. Of note had acute GI bleed during 12/2016 admission for OM of the jaw. No current evidence of bleeding. - monitor - consider iron panel  Hypokalemia: K is 3.3. Will give 40 mg of KDUR. -AM BMP to recheck  ?CKD III Noted per chart although appears to have normal baseline. Cr on admission wnl.  - monitor  ?T2DM Noted per chart although daughter states only had issues with her blood sugars when she was admitted for sepsis 12/2016 2/2 osteomyelitis of the jaw and not currently on medication. Per chart review, was previously on metformin. Glucose on admission 198. A1c: 7.3. Appropriate for age. - monitor with BMP  Chronic back pain Takes tramadol-APAP at home. Currently holding as she is receiving morphine.  - hold home meds, monitor  FEN/GI: Cardiac  diet, PPI/GI cocktail Prophylaxis: heparin gtt  Disposition: Await urine and blood culture for infectious source, once source is identified, patient may proceed with cath per cardiology recommendations  Subjective:  Patient was examined this AM  at bedside. She states she is feeling a lot better and has not been experiencing any chest pain. She states that she has been more comfortable overnight and this morning. She denies feeling feverish or having chills and not fevers were noted overnight. RVP was negative, urinalysis was unremarkable and troponin overnight have been stable.  Objective: Temp:  [98.1 F (36.7 C)-101.2 F (38.4 C)] 98.1 F (36.7 C) (03/04 0432) Pulse Rate:  [55-100] 85 (03/04 0432) Resp:  [8-29] 17 (03/04 0432) BP: (139-185)/(63-109) 160/80 (03/04 0432) SpO2:  [90 %-96 %] 95 % (03/04 0432) Weight:  [66 kg-66.5 kg] 66 kg (03/04 0433) Physical Exam: Physical Exam  Constitutional: She is oriented to person, place, and time and well-developed, well-nourished, and in no distress. No distress.  HENT:  Head: Normocephalic and atraumatic.  Cardiovascular: Normal rate, regular rhythm and normal heart sounds.  Pulmonary/Chest: Effort normal. No respiratory distress.  bibasilar rales  Abdominal: Soft. Bowel sounds are normal. She exhibits no distension. There is no abdominal tenderness.  Musculoskeletal:        General: No edema.  Neurological: She is alert and oriented to person, place, and time.  Skin: Skin is warm and dry. She is not diaphoretic.  Psychiatric: Mood, memory, affect and judgment normal.   Laboratory: Recent Labs  Lab 10/06/18 0729 10/07/18 0203  WBC 11.4* 8.7  HGB 10.0* 9.7*  HCT 35.4* 33.2*  PLT 253 251   Recent Labs  Lab 10/06/18 0729 10/07/18 0203  NA 138 140  K 3.8 3.3*  CL 104 106  CO2 24 25  BUN 19 14  CREATININE 0.84 0.73  CALCIUM 9.4 9.1  PROT 7.4  --   BILITOT 0.4  --   ALKPHOS 55  --   ALT 14  --   AST 18  --   GLUCOSE 198* 169*   BNP (last 3 results) Recent Labs    05/08/18 1201 10/07/18 0203  BNP 1,105.0* 1,399.3*   Urinalysis    Component Value Date/Time   COLORURINE YELLOW 10/06/2018 1600   APPEARANCEUR CLEAR 10/06/2018 1600   LABSPEC 1.013 10/06/2018  1600   PHURINE 7.0 10/06/2018 1600   GLUCOSEU NEGATIVE 10/06/2018 1600   HGBUR NEGATIVE 10/06/2018 1600   BILIRUBINUR NEGATIVE 10/06/2018 1600   KETONESUR NEGATIVE 10/06/2018 1600   PROTEINUR NEGATIVE 10/06/2018 1600   UROBILINOGEN 1.0 04/04/2012 1310   NITRITE NEGATIVE 10/06/2018 1600   LEUKOCYTESUR NEGATIVE 10/06/2018 1600   Lipid Panel     Component Value Date/Time   CHOL 88 10/07/2018 0203   TRIG 113 10/07/2018 0203   HDL 32 (L) 10/07/2018 0203   CHOLHDL 2.8 10/07/2018 0203   VLDL 23 10/07/2018 0203   LDLCALC 33 10/07/2018 0203    Imaging/Diagnostic Tests: Echo: 1. The left ventricle has normal systolic function, with an ejection fraction of 55-60%. The cavity size was normal. There is moderately increased left ventricular wall thickness. Left ventricular diastolic Doppler parameters are consistent with  impaired relaxation.  2. The right ventricle has moderately reduced systolic function. The cavity was mildly enlarged. There is no increase in right ventricular wall thickness. Right ventricular systolic pressure is severely elevated with an estimated pressure of 64.3 mmHg.  3. The mitral valve is normal in structure. Mild thickening of the mitral valve leaflet.  There is mild mitral annular calcification present.  4. The tricuspid valve is normal in structure. Tricuspid valve regurgitation is mild-moderate.  5. The aortic valve is tricuspid Mild thickening of the aortic valve Mild calcification of the aortic valve.  6. The pulmonic valve was normal in structure. Pulmonic valve regurgitation is mild to moderate is mild by color flow Doppler.  7. The inferior vena cava was dilated in size with <50% respiratory variability.  EKG: Sinus rhythm with occasional Premature ventricular complexes Right bundle branch block T wave abnormality, consider inferior ischemia Abnormal ECG  [name], Medical Student 10/07/2018, 8:05 AM MD student, [place] Intern  pager: [phone], text pages welcome  FPTS Upper-Level Resident Addendum   I have independently interviewed and examined the patient. I have discussed the above with the original author and agree with their documentation. My edits for correction/addition/clarification are in green. Please see also any attending notes.    Rory Percy, DO PGY-2, [facility] 10/07/2018 2:12 PM  [place] Service pager: [phone] (text pages welcome through [facility])

## 2018-10-07 NOTE — Progress Notes (Signed)
Pt stated she is feeling anxious and usually takes Xanax 0.51m as needed at home. Family medicine teaching services paged. Will continue to monitor.

## 2018-10-08 ENCOUNTER — Inpatient Hospital Stay (HOSPITAL_COMMUNITY): Payer: Medicare Other

## 2018-10-08 DIAGNOSIS — I249 Acute ischemic heart disease, unspecified: Secondary | ICD-10-CM

## 2018-10-08 LAB — BASIC METABOLIC PANEL
ANION GAP: 13 (ref 5–15)
BUN: 16 mg/dL (ref 8–23)
CO2: 25 mmol/L (ref 22–32)
Calcium: 9.3 mg/dL (ref 8.9–10.3)
Chloride: 103 mmol/L (ref 98–111)
Creatinine, Ser: 1.04 mg/dL — ABNORMAL HIGH (ref 0.44–1.00)
GFR calc Af Amer: 57 mL/min — ABNORMAL LOW (ref >60)
GFR calc non Af Amer: 49 mL/min — ABNORMAL LOW (ref >60)
Glucose, Bld: 151 mg/dL — ABNORMAL HIGH (ref 70–99)
Potassium: 4.1 mmol/L (ref 3.5–5.1)
Sodium: 141 mmol/L (ref 135–145)

## 2018-10-08 LAB — CBC
HCT: 35 % — ABNORMAL LOW (ref 36.0–46.0)
Hemoglobin: 10.7 g/dL — ABNORMAL LOW (ref 12.0–15.0)
MCH: 25.5 pg — ABNORMAL LOW (ref 26.0–34.0)
MCHC: 30.6 g/dL (ref 30.0–36.0)
MCV: 83.5 fL (ref 80.0–100.0)
Platelets: 263 10*3/uL (ref 150–400)
RBC: 4.19 MIL/uL (ref 3.87–5.11)
RDW: 17.9 % — ABNORMAL HIGH (ref 11.5–15.5)
WBC: 7.8 10*3/uL (ref 4.0–10.5)
nRBC: 0 % (ref 0.0–0.2)

## 2018-10-08 LAB — NM MYOCAR MULTI W/SPECT W/WALL MOTION / EF
CHL CUP MPHR: 136 {beats}/min
Peak HR: 86 {beats}/min
Percent HR: 63 %
Rest HR: 74 {beats}/min

## 2018-10-08 LAB — MAGNESIUM: Magnesium: 1.8 mg/dL (ref 1.7–2.4)

## 2018-10-08 LAB — HEPARIN LEVEL (UNFRACTIONATED): HEPARIN UNFRACTIONATED: 0.31 [IU]/mL (ref 0.30–0.70)

## 2018-10-08 MED ORDER — SODIUM CHLORIDE 0.9 % IV SOLN
INTRAVENOUS | Status: DC
  Administered 2018-10-09: 06:00:00 via INTRAVENOUS

## 2018-10-08 MED ORDER — ASPIRIN 81 MG PO CHEW
81.0000 mg | CHEWABLE_TABLET | ORAL | Status: AC
  Administered 2018-10-09: 81 mg via ORAL
  Filled 2018-10-08: qty 1

## 2018-10-08 MED ORDER — TECHNETIUM TC 99M TETROFOSMIN IV KIT
10.0000 | PACK | Freq: Once | INTRAVENOUS | Status: AC | PRN
  Administered 2018-10-08: 10 via INTRAVENOUS

## 2018-10-08 MED ORDER — REGADENOSON 0.4 MG/5ML IV SOLN
0.4000 mg | Freq: Once | INTRAVENOUS | Status: AC
  Administered 2018-10-08: 0.4 mg via INTRAVENOUS
  Filled 2018-10-08: qty 5

## 2018-10-08 MED ORDER — ALPRAZOLAM 0.25 MG PO TABS
0.2500 mg | ORAL_TABLET | Freq: Two times a day (BID) | ORAL | Status: DC | PRN
  Administered 2018-10-08: 0.25 mg via ORAL
  Filled 2018-10-08: qty 1

## 2018-10-08 MED ORDER — SALINE SPRAY 0.65 % NA SOLN
1.0000 | NASAL | Status: DC | PRN
  Administered 2018-10-08: 1 via NASAL
  Filled 2018-10-08 (×2): qty 44

## 2018-10-08 MED ORDER — TECHNETIUM TC 99M TETROFOSMIN IV KIT
30.0000 | PACK | Freq: Once | INTRAVENOUS | Status: AC | PRN
  Administered 2018-10-08: 30 via INTRAVENOUS

## 2018-10-08 MED ORDER — CARVEDILOL 12.5 MG PO TABS
12.5000 mg | ORAL_TABLET | Freq: Two times a day (BID) | ORAL | Status: DC
  Administered 2018-10-08 – 2018-10-10 (×4): 12.5 mg via ORAL
  Filled 2018-10-08 (×4): qty 1

## 2018-10-08 MED ORDER — MAGNESIUM SULFATE 2 GM/50ML IV SOLN
2.0000 g | Freq: Once | INTRAVENOUS | Status: AC
  Administered 2018-10-08: 2 g via INTRAVENOUS
  Filled 2018-10-08: qty 50

## 2018-10-08 MED ORDER — FUROSEMIDE 40 MG PO TABS
40.0000 mg | ORAL_TABLET | Freq: Every day | ORAL | Status: DC
  Administered 2018-10-09 – 2018-10-10 (×2): 40 mg via ORAL
  Filled 2018-10-08 (×2): qty 1

## 2018-10-08 MED ORDER — REGADENOSON 0.4 MG/5ML IV SOLN
INTRAVENOUS | Status: AC
  Administered 2018-10-08: 10:00:00
  Filled 2018-10-08: qty 5

## 2018-10-08 NOTE — Progress Notes (Addendum)
Advanced Heart Failure Rounding Note  PCP-Cardiologist: Jenkins Rouge, MD   Subjective:    Diuresed with IV lasix yesterday. I/O negative 1.5 L. No weight yet this am. Creatinine trending up 0.73 -> 1.04  Went for lexiscan cardiolite this am. Final results pending.  She doesn't feel well this morning. IV is burning where mag is going through (burned yesterday too - site looks okay). She also has a headache. Felt flushed during stress test. No SOB and and no further CP.   Objective:   Weight Range: 66 kg Body mass index is 26.59 kg/m.   Vital Signs:   Temp:  [98 F (36.7 C)-98.6 F (37 C)] 98 F (36.7 C) (03/05 0525) Pulse Rate:  [77-84] 84 (03/05 0821) Resp:  [17-26] 17 (03/05 0821) BP: (115-142)/(46-78) 142/78 (03/05 0821) SpO2:  [91 %-100 %] 91 % (03/05 0821) Last BM Date: 10/07/18  Weight change: Filed Weights   10/06/18 0719 10/06/18 1949 10/07/18 0433  Weight: 67.6 kg 66.5 kg 66 kg    Intake/Output:   Intake/Output Summary (Last 24 hours) at 10/08/2018 0850 Last data filed at 10/08/2018 0630 Gross per 24 hour  Intake 1340.5 ml  Output 2800 ml  Net -1459.5 ml      Physical Exam    General: Looks uncomfortable. 85-year-old.  No resp difficulty HEENT: Normal Neck: Supple. JVP ~8. Carotids 2+ bilat; no bruits. No lymphadenopathy or thyromegaly appreciated. Cor: PMI nondisplaced. Regular rate & rhythm. No rubs, gallops or murmurs. Lungs: Clear, distant Abdomen: Soft, nontender, nondistended. No hepatosplenomegaly. No bruits or masses. Good bowel sounds. Extremities: No cyanosis, clubbing, rash, edema. RUE PIV stable.  Neuro: Alert & orientedx3, cranial nerves grossly intact. moves all 4 extremities w/o difficulty. Affect pleasant   Telemetry   NSR 70-80s. Personally reviewed.   EKG    NSR 81 bpm  with RBBB. TWI in V1-V4, II, and aVF  Labs    CBC Recent Labs    10/07/18 0203 10/08/18 0500  WBC 8.7 7.8  HGB 9.7* 10.7*  HCT 33.2* 35.0*  MCV 83.6  83.5  PLT 251 594   Basic Metabolic Panel Recent Labs    10/07/18 0203 10/08/18 0500  NA 140 141  K 3.3* 4.1  CL 106 103  CO2 25 25  GLUCOSE 169* 151*  BUN 14 16  CREATININE 0.73 1.04*  CALCIUM 9.1 9.3  MG 1.6* 1.8   Liver Function Tests Recent Labs    10/06/18 0729  AST 18  ALT 14  ALKPHOS 55  BILITOT 0.4  PROT 7.4  ALBUMIN 3.3*   No results for input(s): LIPASE, AMYLASE in the last 72 hours. Cardiac Enzymes Recent Labs    10/06/18 1048 10/06/18 1800 10/07/18 0203  TROPONINI 0.04* 0.05* 0.04*    BNP: BNP (last 3 results) Recent Labs    05/08/18 1201 10/07/18 0203  BNP 1,105.0* 1,399.3*    ProBNP (last 3 results) No results for input(s): PROBNP in the last 8760 hours.   D-Dimer No results for input(s): DDIMER in the last 72 hours. Hemoglobin A1C Recent Labs    10/07/18 0203  HGBA1C 7.3*   Fasting Lipid Panel Recent Labs    10/07/18 0203  CHOL 88  HDL 32*  LDLCALC 33  TRIG 113  CHOLHDL 2.8   Thyroid Function Tests Recent Labs    10/07/18 0712  TSH 0.876    Other results:   Imaging     No results found.   Medications:  Scheduled Medications: . aspirin EC  81 mg Oral Daily  . atorvastatin  80 mg Oral Daily  . carvedilol  6.25 mg Oral BID WC  . clopidogrel  75 mg Oral Daily  . furosemide  60 mg Intravenous BID  . gabapentin  300 mg Oral QHS  . macitentan  10 mg Oral Daily  . mirtazapine  30 mg Oral QHS  . pantoprazole  40 mg Oral Daily  . sildenafil  20 mg Oral TID  . sodium chloride flush  3 mL Intravenous Q12H     Infusions: . sodium chloride    . sodium chloride    . heparin 1,000 Units/hr (10/08/18 0636)     PRN Medications:  sodium chloride, acetaminophen, ALPRAZolam, morphine injection, ondansetron (ZOFRAN) IV, sodium chloride flush    Patient Profile   85 y.o.with history of DMII, HTN, GERD, HTN, prior osteomyelitis of jaw pulmonary hypertension/RV failure, and CAD.   Admitted with chest  pain and fever  Assessment/Plan   1. Chest Pain , known CAD LHC 05/2018 showed complex bifurcation lesion with 95% stenosis proximal LAD with calcification at take-off of large D2. The ostial/proximal D2 also had 95% calcified stenosis. Suspect this lesion played a role in her exertional dyspnea and also in her mildly decreased LV systolic function (EF 65-46% on echo). S/P successful atherectomy with bifurcation stenting of proximal LAD and D2 05/15/18. - Troponin trend flat.  - EKG with new TWI in V1-V4.  - LHC delayed with fever. Remains on heparin drip.   - Continue ASA, plavix, and statin.  - No further CP - Stress test today. Final results pending. Can likely DC soon if results are okay.   2. Fever - Tmax >101. WBC 11 on admit. Blood NGTD. Received Vanc/Cefepime. WBC now normal. Afebrile.  - CXR was negative for infiltrates. UA negative. Flu negative. - Per primary team.   3. PAH  She appearedto have at least moderate pulmonary hypertension by echoin 7/19with RV failure. CT chest showed mild emphysema, which should not explain her degree of RV failure. V/Q scan did not show evidence for chronic PEs.Anti-centromere antibody, ANA, and RF were all positive. ?CREST variant of scleroderma.[place] 05/15/18 showed moderate PAH but very high PVR and low cardiac output. This is concerning for advanced pulmonary hypertension. - Continue sildenafil 20 mg three times a day + 10 mg opsumit daily. Waiting on selexipag to be delivered at home.  - Continue 4 L O2 (home dose)  4. Chronic Diastolic HF/RV Failure - Echo 10/06/18 EF 55-60%, RV moderately reduced - Volume improved. Renal function trending up - Received 60 mg IV lasix this am. Hold this afternoon's dose. I think she can go to back on oral diuretics tomorrow. She takes lasix 20 mg daily => would give 40 mg daily for new dosing.    5. Suspect CREST syndrome - Needs to follow up with Dr Amil Amen.   6.  Hypokalemia/hypomagnesium - K 4.1. Mag 1.8. Supp.    Length of Stay: [place], NP  10/08/2018, 8:50 AM  Advanced Heart Failure Team Pager [phone] (M-F; [place])  Please contact [facility] for night-coverage after hours (4p -7a ) and weekends on [email or web address]  Patient seen with NP, agree with the above note.   No further chest pain. Troponin not significantly elevated.  BNP high.  Chest XR without active disease. Nonspecific changes on ECG compared to prior.  Today, she denies dyspnea.  She diuresed reasonably well yesterday.  Pending results on Cardiolite.   Echo was done showing EF 55-60%, mildly dilated RV with moderately decreased systolic function. PASP 64 mmHg.   On exam, JVP 8-9 cm.  Mild crackles at bases.  Regular S1S2, no S3.  No edema.   1. Febrile illness: Admitted with T > 101 and chest pain.  WBCs 8.7 today.  Cultures negative so far, no definite PNA on CXR.  Now afebrile. Respiratory virus panel negative.  Not currently on abx, got vancomycin and cefepime in ER.  - Per primary service.  2. CAD: Coronary angiography in 10/19 showed complex bifurcation lesion with 95% stenosis proximal LAD with calcification at take-off of large D2. The ostial/proximal D2 also had 95% calcified stenosis. Suspect this lesion played a role in her exertional dyspnea and also in her mildly decreased LV systolic function (EF 84-13% on echo). S/p successful atherectomy with bifurcation stenting of proximal LAD and D2 05/15/18.She had chest pain at admission, very mild TnI elevation (0.05) without trend, nonspecific ECG changes.  Given prolonged CP and no significant rise in TnI, doubt ACS.  Possible demand ischemia with volume overload.  Echo this admission with EF 55-60%.   - Continue ASA 81and Plavix. - Continue atorvastatin, good lipids this admission. - Lexiscan Cardiolite done today, pending results.   3.Pulmonary hypertension: She appearedto have at least moderate pulmonary  hypertension by echoin 7/19with RV failure. CT chest showed mild emphysema, which should not explain her degree of RV failure. V/Q scan did not show evidence for chronic PEs.Anti-centromere antibody, ANA, and RF were all positive. ?CREST variant of scleroderma.She remains quite symptomatic and is on home oxygen.[place] 05/15/18 showed moderate PAH but very high PVR and low cardiac output. This is concerning for advanced pulmonary hypertension.  - Continued on sildenafil20 mg TID.  - Continue Opsumit 10 mg daily.  -She is awaiting selexipag (has not arrived at her house yet). Because of interaction with Plavix, she will take selexipag once daily rather than twice daily.  - Refuses sleep study.  4. Acute on chronic diastolic CHF/RV failure: Echo with EF 55-60%, moderately decreased RV systolic function.  Mild TnI elevation is likely demand ischemia from volume overload.  She diuresed yesterday, volume status looks better today.  - Stop IV Lasix (got 1 dose this morning) and switch to Lasix 40 mg po daily tomorrow for home.  5.Suspect CREST syndrome: She missed her appointment with rheumatology, Dr. Amil Amen. I will refer her to him again.   Loralie Champagne 10/08/2018 1:18 PM

## 2018-10-08 NOTE — Progress Notes (Signed)
ANTICOAGULATION CONSULT NOTE - Follow Up Consult  Pharmacy Consult for Heparin Indication: chest pain/ACS  Allergies  Allergen Reactions  . Sulfa Antibiotics Swelling    Mouth and tongue swelling    Patient Measurements: Height: _0  (157.5 cm) Weight: 145 lb 6.4 oz (66 kg) IBW/kg (Calculated) : 50.1 Heparin Dosing Weight: 63.8 kg  Vital Signs: Temp: 98 F (36.7 C) (03/05 0525) Temp Source: Oral (03/05 0525) BP: 142/78 (03/05 0821) Pulse Rate: 84 (03/05 0821)  Labs: Recent Labs    10/06/18 0729 10/06/18 1048  10/06/18 1800 10/07/18 0203 10/07/18 0940 10/08/18 0500  HGB 10.0*  --   --   --  9.7*  --  10.7*  HCT 35.4*  --   --   --  33.2*  --  35.0*  PLT 253  --   --   --  251  --  263  HEPARINUNFRC  --   --    < > 0.16* 0.31 0.31 0.31  CREATININE 0.84  --   --   --  0.73  --  1.04*  TROPONINI 0.04* 0.04*  --  0.05* 0.04*  --   --    < > = values in this interval not displayed.    Estimated Creatinine Clearance: 35.9 mL/min (A) (by C-G formula based on SCr of 1.04 mg/dL (H)).   Medications:  Scheduled:  . aspirin EC  81 mg Oral Daily  . atorvastatin  80 mg Oral Daily  . carvedilol  6.25 mg Oral BID WC  . clopidogrel  75 mg Oral Daily  . furosemide  60 mg Intravenous BID  . gabapentin  300 mg Oral QHS  . macitentan  10 mg Oral Daily  . mirtazapine  30 mg Oral QHS  . pantoprazole  40 mg Oral Daily  . sildenafil  20 mg Oral TID  . sodium chloride flush  3 mL Intravenous Q12H   Infusions:  . sodium chloride    . sodium chloride    . heparin 1,000 Units/hr (10/08/18 0636)    Assessment: 85 yo F admitted with chest pain.  Pt initiated on heparin.  Pt is currently therapeutic on 1000 units/hr.  Plans for stress test 3/5.  Goal of Therapy:  Heparin level 0.3-0.7 units/ml Monitor platelets by anticoagulation protocol: Yes   Plan:  Continue heparin at 1000 units/hr. Follow-up after stress test.  Horton Chin, Pharm.D., BCPS Clinical  Pharmacist  **Pharmacist phone directory can now be found on [email or web address] ([email or web address]).  Listed under [place].  10/08/2018 8:26 AM

## 2018-10-08 NOTE — Progress Notes (Signed)
Pt had abnormal stress test. Discussed results. Agreeable to [facility] tomorrow at noon. She is very anxious because she has had hematoma from cath in the past. Dr Aundra Dubin aware.   Georgiana Shore, NP

## 2018-10-08 NOTE — H&P (View-Only) (Signed)
  Advanced Heart Failure Rounding Note  PCP-Cardiologist: Peter Nishan, MD   Subjective:    Diuresed with IV lasix yesterday. I/O negative 1.5 L. No weight yet this am. Creatinine trending up 0.73 -> 1.04  Went for lexiscan cardiolite this am. Final results pending.  She doesn't feel well this morning. IV is burning where mag is going through (burned yesterday too - site looks okay). She also has a headache. Felt flushed during stress test. No SOB and and no further CP.   Objective:   Weight Range: 66 kg Body mass index is 26.59 kg/m.   Vital Signs:   Temp:  [98 F (36.7 C)-98.6 F (37 C)] 98 F (36.7 C) (03/05 0525) Pulse Rate:  [77-84] 84 (03/05 0821) Resp:  [17-26] 17 (03/05 0821) BP: (115-142)/(46-78) 142/78 (03/05 0821) SpO2:  [91 %-100 %] 91 % (03/05 0821) Last BM Date: 10/07/18  Weight change: Filed Weights   10/06/18 0719 10/06/18 1949 10/07/18 0433  Weight: 67.6 kg 66.5 kg 66 kg    Intake/Output:   Intake/Output Summary (Last 24 hours) at 10/08/2018 0850 Last data filed at 10/08/2018 0630 Gross per 24 hour  Intake 1340.5 ml  Output 2800 ml  Net -1459.5 ml      Physical Exam    General: Looks uncomfortable. Elderly.  No resp difficulty HEENT: Normal Neck: Supple. JVP ~8. Carotids 2+ bilat; no bruits. No lymphadenopathy or thyromegaly appreciated. Cor: PMI nondisplaced. Regular rate & rhythm. No rubs, gallops or murmurs. Lungs: Clear, distant Abdomen: Soft, nontender, nondistended. No hepatosplenomegaly. No bruits or masses. Good bowel sounds. Extremities: No cyanosis, clubbing, rash, edema. RUE PIV stable.  Neuro: Alert & orientedx3, cranial nerves grossly intact. moves all 4 extremities w/o difficulty. Affect pleasant   Telemetry   NSR 70-80s. Personally reviewed.   EKG    NSR 81 bpm  with RBBB. TWI in V1-V4, II, and aVF  Labs    CBC Recent Labs    10/07/18 0203 10/08/18 0500  WBC 8.7 7.8  HGB 9.7* 10.7*  HCT 33.2* 35.0*  MCV 83.6  83.5  PLT 251 263   Basic Metabolic Panel Recent Labs    10/07/18 0203 10/08/18 0500  NA 140 141  K 3.3* 4.1  CL 106 103  CO2 25 25  GLUCOSE 169* 151*  BUN 14 16  CREATININE 0.73 1.04*  CALCIUM 9.1 9.3  MG 1.6* 1.8   Liver Function Tests Recent Labs    10/06/18 0729  AST 18  ALT 14  ALKPHOS 55  BILITOT 0.4  PROT 7.4  ALBUMIN 3.3*   No results for input(s): LIPASE, AMYLASE in the last 72 hours. Cardiac Enzymes Recent Labs    10/06/18 1048 10/06/18 1800 10/07/18 0203  TROPONINI 0.04* 0.05* 0.04*    BNP: BNP (last 3 results) Recent Labs    05/08/18 1201 10/07/18 0203  BNP 1,105.0* 1,399.3*    ProBNP (last 3 results) No results for input(s): PROBNP in the last 8760 hours.   D-Dimer No results for input(s): DDIMER in the last 72 hours. Hemoglobin A1C Recent Labs    10/07/18 0203  HGBA1C 7.3*   Fasting Lipid Panel Recent Labs    10/07/18 0203  CHOL 88  HDL 32*  LDLCALC 33  TRIG 113  CHOLHDL 2.8   Thyroid Function Tests Recent Labs    10/07/18 0712  TSH 0.876    Other results:   Imaging     No results found.   Medications:       Scheduled Medications: . aspirin EC  81 mg Oral Daily  . atorvastatin  80 mg Oral Daily  . carvedilol  6.25 mg Oral BID WC  . clopidogrel  75 mg Oral Daily  . furosemide  60 mg Intravenous BID  . gabapentin  300 mg Oral QHS  . macitentan  10 mg Oral Daily  . mirtazapine  30 mg Oral QHS  . pantoprazole  40 mg Oral Daily  . sildenafil  20 mg Oral TID  . sodium chloride flush  3 mL Intravenous Q12H     Infusions: . sodium chloride    . sodium chloride    . heparin 1,000 Units/hr (10/08/18 0636)     PRN Medications:  sodium chloride, acetaminophen, ALPRAZolam, morphine injection, ondansetron (ZOFRAN) IV, sodium chloride flush    Patient Profile   86 y.o.with history of DMII, HTN, GERD, HTN, prior osteomyelitis of jaw pulmonary hypertension/RV failure, and CAD.   Admitted with chest  pain and fever  Assessment/Plan   1. Chest Pain , known CAD LHC 05/2018 showed complex bifurcation lesion with 95% stenosis proximal LAD with calcification at take-off of large D2. The ostial/proximal D2 also had 95% calcified stenosis. Suspect this lesion played a role in her exertional dyspnea and also in her mildly decreased LV systolic function (EF 45-50% on echo). S/P successful atherectomy with bifurcation stenting of proximal LAD and D2 05/15/18. - Troponin trend flat.  - EKG with new TWI in V1-V4.  - LHC delayed with fever. Remains on heparin drip.   - Continue ASA, plavix, and statin.  - No further CP - Stress test today. Final results pending. Can likely DC soon if results are okay.   2. Fever - Tmax >101. WBC 11 on admit. Blood NGTD. Received Vanc/Cefepime. WBC now normal. Afebrile.  - CXR was negative for infiltrates. UA negative. Flu negative. - Per primary team.   3. PAH  She appearedto have at least moderate pulmonary hypertension by echoin 7/19with RV failure. CT chest showed mild emphysema, which should not explain her degree of RV failure. V/Q scan did not show evidence for chronic PEs.Anti-centromere antibody, ANA, and RF were all positive. ?CREST variant of scleroderma.RHC 05/15/18 showed moderate PAH but very high PVR and low cardiac output. This is concerning for advanced pulmonary hypertension. - Continue sildenafil 20 mg three times a day + 10 mg opsumit daily. Waiting on selexipag to be delivered at home.  - Continue 4 L O2 (home dose)  4. Chronic Diastolic HF/RV Failure - Echo 10/06/18 EF 55-60%, RV moderately reduced - Volume improved. Renal function trending up - Received 60 mg IV lasix this am. Hold this afternoon's dose. I think she can go to back on oral diuretics tomorrow. She takes lasix 20 mg daily => would give 40 mg daily for new dosing.    5. Suspect CREST syndrome - Needs to follow up with Dr Beekman.   6.  Hypokalemia/hypomagnesium - K 4.1. Mag 1.8. Supp.    Length of Stay: 2  Ashley M Smith, NP  10/08/2018, 8:50 AM  Advanced Heart Failure Team Pager [phone] (M-F; 7a - 4p)  Please contact [facility] for night-coverage after hours (4p -7a ) and weekends on [email or web address]  Patient seen with NP, agree with the above note.   No further chest pain. Troponin not significantly elevated.  BNP high.  Chest XR without active disease. Nonspecific changes on ECG compared to prior.  Today, she denies dyspnea.  She diuresed reasonably well yesterday.    Pending results on Cardiolite.   Echo was done showing EF 55-60%, mildly dilated RV with moderately decreased systolic function. PASP 64 mmHg.   On exam, JVP 8-9 cm.  Mild crackles at bases.  Regular S1S2, no S3.  No edema.   1. Febrile illness: Admitted with T > 101 and chest pain.  WBCs 8.7 today.  Cultures negative so far, no definite PNA on CXR.  Now afebrile. Respiratory virus panel negative.  Not currently on abx, got vancomycin and cefepime in ER.  - Per primary service.  2. CAD: Coronary angiography in 10/19 showed complex bifurcation lesion with 95% stenosis proximal LAD with calcification at take-off of large D2. The ostial/proximal D2 also had 95% calcified stenosis. Suspect this lesion played a role in her exertional dyspnea and also in her mildly decreased LV systolic function (EF 45-50% on echo). S/p successful atherectomy with bifurcation stenting of proximal LAD and D2 05/15/18.She had chest pain at admission, very mild TnI elevation (0.05) without trend, nonspecific ECG changes.  Given prolonged CP and no significant rise in TnI, doubt ACS.  Possible demand ischemia with volume overload.  Echo this admission with EF 55-60%.   - Continue ASA 81and Plavix. - Continue atorvastatin, good lipids this admission. - Lexiscan Cardiolite done today, pending results.   3.Pulmonary hypertension: She appearedto have at least moderate pulmonary  hypertension by echoin 7/19with RV failure. CT chest showed mild emphysema, which should not explain her degree of RV failure. V/Q scan did not show evidence for chronic PEs.Anti-centromere antibody, ANA, and RF were all positive. ?CREST variant of scleroderma.She remains quite symptomatic and is on home oxygen.RHC 05/15/18 showed moderate PAH but very high PVR and low cardiac output. This is concerning for advanced pulmonary hypertension.  - Continued on sildenafil20 mg TID.  - Continue Opsumit 10 mg daily.  -She is awaiting selexipag (has not arrived at her house yet). Because of interaction with Plavix, she will take selexipag once daily rather than twice daily.  - Refuses sleep study.  4. Acute on chronic diastolic CHF/RV failure: Echo with EF 55-60%, moderately decreased RV systolic function.  Mild TnI elevation is likely demand ischemia from volume overload.  She diuresed yesterday, volume status looks better today.  - Stop IV Lasix (got 1 dose this morning) and switch to Lasix 40 mg po daily tomorrow for home.  5.Suspect CREST syndrome: She missed her appointment with rheumatology, Dr. Beekman. I will refer her to him again.   Dalton McLean 10/08/2018 1:18 PM  

## 2018-10-08 NOTE — Progress Notes (Signed)
[name] presented for a nuclear stress test today.  No immediate complications.  Stress imaging is pending at this time.  Preliminary EKG findings may be listed in the chart, but the stress test result will not be finalized until perfusion imaging is complete.  Tami Lin Mitsuru Dault, PA-C 10/08/2018, 10:41 AM

## 2018-10-08 NOTE — Progress Notes (Addendum)
Family Medicine Teaching Service Daily Progress Note Intern Pager: [phone]  Patient name: [name] Medical record number: [id number] Date of birth: [birth year] Age: 85 y.o. Gender: female  Primary Care Provider: [name], PA-C Consultants: cardiology Code Status: DNR  Pt Overview and Major Events to Date:  3/3 - admitted for chest pain, ACS r/o 3/5 - cardiolite lexiscan  Assessment and Plan: [name] a 85 y.o.femalepresenting with chest pain radiating to the shoulders and back. PMH is significant forCAD s/pstents, pulmonary HTN,GERD, HTN,HLD, GERD, Hx CVA, Depression, CKD III, h/o Osteomyelitis of Jaw, ?T2DM.  Chest Pain 2/2Unstable Angina (resolved), h/o CAD s/p stent, CHF NYHA III Chest pain continues to be resolved. EKG this am largely unchanged from yesterday. Breath sounds improved this am although shallow, no LE edema. Underwent Cardiolite lexiscan with Cardiology this am, awaiting results. Remains on heparin drip. Disposition pending results.  - continue morphine for now, can consider d/c and add back home tramadol once plan from Cardiology confirmed. -IV heparin per Cardiology -Continue ASA and plavix -Appreciate cardiology input - zofran prn - oxygen therapy as needed, on 4L O2 at home per daughter - lasix per Cards, plan to hold further IV today and restart 30m lasix tomorrow  Fever, resolved Patient is afebrile since 5pm 3/3 and no longer has elevated leukocytosis. Likely viral etiology as patient now reports slight nasal congestion. Blood and urine culture negative, RVP negative. Influenza A and B negative. Bibasilar rales improved with diuresis, less likely pneumonia.  Pulmonary HTN, chronic Followed by Dr. MAundra Dubinwith HF. Most recent estimated PA pressure 61 mmHg, L/RHC 05/2018. Underwent rheumatological work up concerning for possible CREST syndrome. On opsumit, sildenafil at home. Able to walk 1032fat baseline without SOB.  -will continue  home medication of opsumit andsildenafil.  HTN, chronic Blood pressure remains intermittently elevated.On coreg, lasixat home.  -Increase coreg dose  HLD Lipid panel WNL,on high dose statin - continue lipitor  GERD May contribute to chest pain as above. -Continue PPI  Normocytic Anemia Hb 10 on admission (stable from 1 month ago), currently is 9.7. Baseline ~9-10. Of note had acute GI bleed during 12/2016 admission for OM of the jaw. No current evidence of bleeding. - monitor - consider iron panel in outpatient setting  Hypokalemia, resolved K 4.1. - monitor  AKI on ?CKD III Cr elevated to 1.04 this am s/p Lasix. Per Cards, will hold pm IV Lasix and start PO tomorrow. - monitor  ?T2DM Noted per chart although daughter states only had issues with her blood sugars when she was admitted for sepsis 12/2016 2/2 osteomyelitis of the jaw and not currently on medication. Per chart review, was previously on metformin. Glucose on admission 198. A1c: 7.3. Appropriate for age.  - monitor with BMP  Chronic back pain Takes tramadol-APAP at home. Currently holding as she is receiving morphine.  - hold home meds, monitor  FEN/GI:Cardiac diet, PPI/GI cocktail Prophylaxis:heparingtt  Disposition: No clear infectious etiology. Most likely was viral infection. Cardiology proceeding with lexiscan and stress test today. Disposition pending results.  Subjective:  Patient was examined at bedside this AM and is feeling fine. She has not had any chest pain overnight. Patient is informed on the plan from cardiology and is awaiting a stress test and lexiscan today  Objective: Temp:  [98 F (36.7 C)-98.6 F (37 C)] 98 F (36.7 C) (03/05 0525) Pulse Rate:  [77-84] 84 (03/05 0821) Resp:  [17-26] 17 (03/05 0821) BP: (115-142)/(46-78) 142/78 (03/05 0821) SpO2:  [  91 %-100 %] 91 % (03/05 0821) Physical Exam: Physical Exam  Constitutional: She is oriented to person, place, and  time and well-developed, well-nourished, and in no distress. No distress.  HENT:  Head: Normocephalic and atraumatic.  Cardiovascular: Normal rate, regular rhythm and normal heart sounds. Exam reveals no friction rub.  No murmur heard. Pulmonary/Chest: Breath sounds normal. She is in respiratory distress. She has no wheezes.  Abdominal: Soft. Bowel sounds are normal. She exhibits no distension. There is no abdominal tenderness.  Neurological: She is alert and oriented to person, place, and time.  Skin: She is not diaphoretic.  Psychiatric: Mood and affect normal.   Laboratory: Recent Labs  Lab 10/06/18 0729 10/07/18 0203 10/08/18 0500  WBC 11.4* 8.7 7.8  HGB 10.0* 9.7* 10.7*  HCT 35.4* 33.2* 35.0*  PLT 253 251 263   Recent Labs  Lab 10/06/18 0729 10/07/18 0203 10/08/18 0500  NA 138 140 141  K 3.8 3.3* 4.1  CL 104 106 103  CO2 _0 BUN _1 CREATININE 0.84 0.73 1.04*  CALCIUM 9.4 9.1 9.3  PROT 7.4  --   --   BILITOT 0.4  --   --   ALKPHOS 55  --   --   ALT 14  --   --   AST 18  --   --   GLUCOSE 198* 169* 151*    Imaging/Diagnostic Tests: EKG:  Normal sinus rhythm Right bundle branch block T wave abnormality, consider inferior ischemia  [name], Medical Student 10/08/2018, 8:56 AM PGY-0, [facility] Intern pager: [phone], text pages welcome  FPTS Upper-Level Resident Addendum   I have independently interviewed and examined the patient. I have discussed the above with the original author and agree with their documentation. My edits for correction/addition/clarification are in green. Please see also any attending notes.    Rory Percy, DO PGY-2, [facility] 10/08/2018 2:28 PM  FPTS Service pager: [phone] (text pages welcome through [facility])

## 2018-10-09 ENCOUNTER — Encounter (HOSPITAL_COMMUNITY): Payer: Medicare Other | Admitting: Cardiology

## 2018-10-09 ENCOUNTER — Telehealth (HOSPITAL_COMMUNITY): Payer: Self-pay

## 2018-10-09 ENCOUNTER — Encounter (HOSPITAL_COMMUNITY)
Admission: EM | Disposition: A | Discharge status: Home / Self Care | Payer: Self-pay | Source: Home / Self Care | Attending: Family Medicine

## 2018-10-09 DIAGNOSIS — I2510 Atherosclerotic heart disease of native coronary artery without angina pectoris: Secondary | ICD-10-CM

## 2018-10-09 HISTORY — PX: LEFT HEART CATH AND CORONARY ANGIOGRAPHY: CATH118249

## 2018-10-09 LAB — CBC
HCT: 34.9 % — ABNORMAL LOW (ref 36.0–46.0)
Hemoglobin: 10.6 g/dL — ABNORMAL LOW (ref 12.0–15.0)
MCH: 25.2 pg — ABNORMAL LOW (ref 26.0–34.0)
MCHC: 30.4 g/dL (ref 30.0–36.0)
MCV: 83.1 fL (ref 80.0–100.0)
Platelets: 283 10*3/uL (ref 150–400)
RBC: 4.2 MIL/uL (ref 3.87–5.11)
RDW: 17.5 % — ABNORMAL HIGH (ref 11.5–15.5)
WBC: 9.5 10*3/uL (ref 4.0–10.5)
nRBC: 0 % (ref 0.0–0.2)

## 2018-10-09 LAB — BASIC METABOLIC PANEL
Anion gap: 11 (ref 5–15)
BUN: 24 mg/dL — ABNORMAL HIGH (ref 8–23)
CO2: 27 mmol/L (ref 22–32)
Calcium: 9.2 mg/dL (ref 8.9–10.3)
Chloride: 101 mmol/L (ref 98–111)
Creatinine, Ser: 0.99 mg/dL (ref 0.44–1.00)
GFR calc Af Amer: >60 mL/min (ref >60)
GFR calc non Af Amer: 52 mL/min — ABNORMAL LOW (ref >60)
Glucose, Bld: 152 mg/dL — ABNORMAL HIGH (ref 70–99)
Potassium: 3.5 mmol/L (ref 3.5–5.1)
SODIUM: 139 mmol/L (ref 135–145)

## 2018-10-09 LAB — HEPARIN LEVEL (UNFRACTIONATED): Heparin Unfractionated: 0.49 IU/mL (ref 0.30–0.70)

## 2018-10-09 LAB — GLUCOSE, CAPILLARY: Glucose-Capillary: 191 mg/dL — ABNORMAL HIGH (ref 70–99)

## 2018-10-09 SURGERY — LEFT HEART CATH AND CORONARY ANGIOGRAPHY
Anesthesia: LOCAL

## 2018-10-09 MED ORDER — POTASSIUM CHLORIDE CRYS ER 20 MEQ PO TBCR
40.0000 meq | EXTENDED_RELEASE_TABLET | Freq: Every day | ORAL | Status: DC
  Administered 2018-10-09 – 2018-10-10 (×2): 40 meq via ORAL
  Filled 2018-10-09 (×2): qty 2

## 2018-10-09 MED ORDER — ONDANSETRON HCL 4 MG/2ML IJ SOLN: 4.0000 mg | Freq: Four times a day (QID) | INTRAMUSCULAR | Status: DC | PRN

## 2018-10-09 MED ORDER — SODIUM CHLORIDE 0.9% FLUSH
3.0000 mL | Freq: Two times a day (BID) | INTRAVENOUS | Status: DC
  Administered 2018-10-09 – 2018-10-10 (×2): 3 mL via INTRAVENOUS

## 2018-10-09 MED ORDER — HEPARIN (PORCINE) IN NACL 1000-0.9 UT/500ML-% IV SOLN
INTRAVENOUS | Status: AC
  Filled 2018-10-09: qty 500

## 2018-10-09 MED ORDER — HEPARIN (PORCINE) IN NACL 1000-0.9 UT/500ML-% IV SOLN
INTRAVENOUS | Status: DC | PRN
  Administered 2018-10-09 (×2): 500 mL

## 2018-10-09 MED ORDER — FENTANYL CITRATE (PF) 100 MCG/2ML IJ SOLN
INTRAMUSCULAR | Status: AC
  Filled 2018-10-09: qty 2

## 2018-10-09 MED ORDER — MIDAZOLAM HCL 2 MG/2ML IJ SOLN
INTRAMUSCULAR | Status: DC | PRN
  Administered 2018-10-09: 1 mg via INTRAVENOUS

## 2018-10-09 MED ORDER — SODIUM CHLORIDE 0.9% FLUSH: 3.0000 mL | INTRAVENOUS | Status: DC | PRN

## 2018-10-09 MED ORDER — SODIUM CHLORIDE 0.9 % IV SOLN: 250.0000 mL | INTRAVENOUS | Status: DC | PRN

## 2018-10-09 MED ORDER — SODIUM CHLORIDE 0.9 % IV SOLN
INTRAVENOUS | Status: AC
  Administered 2018-10-09: 14:00:00 via INTRAVENOUS

## 2018-10-09 MED ORDER — MAGNESIUM SULFATE 2 GM/50ML IV SOLN
2.0000 g | Freq: Once | INTRAVENOUS | Status: AC
  Administered 2018-10-09: 2 g via INTRAVENOUS
  Filled 2018-10-09: qty 50

## 2018-10-09 MED ORDER — LIDOCAINE HCL (PF) 1 % IJ SOLN
INTRAMUSCULAR | Status: DC | PRN
  Administered 2018-10-09: 18 mL

## 2018-10-09 MED ORDER — LIDOCAINE HCL (PF) 1 % IJ SOLN
INTRAMUSCULAR | Status: AC
  Filled 2018-10-09: qty 30

## 2018-10-09 MED ORDER — IOHEXOL 350 MG/ML SOLN
INTRAVENOUS | Status: DC | PRN
  Administered 2018-10-09: 40 mL via INTRA_ARTERIAL

## 2018-10-09 MED ORDER — VERAPAMIL HCL 2.5 MG/ML IV SOLN
INTRAVENOUS | Status: AC
  Filled 2018-10-09: qty 2

## 2018-10-09 MED ORDER — FENTANYL CITRATE (PF) 100 MCG/2ML IJ SOLN
INTRAMUSCULAR | Status: DC | PRN
  Administered 2018-10-09: 25 ug via INTRAVENOUS

## 2018-10-09 MED ORDER — ACETAMINOPHEN 325 MG PO TABS: 650.0000 mg | ORAL_TABLET | ORAL | Status: DC | PRN

## 2018-10-09 MED ORDER — MIDAZOLAM HCL 2 MG/2ML IJ SOLN
INTRAMUSCULAR | Status: AC
  Filled 2018-10-09: qty 2

## 2018-10-09 SURGICAL SUPPLY — 11 items
CATH INFINITI 5FR MULTPACK ANG (CATHETERS) ×2 IMPLANT
CATH SWAN GANZ 7F STRAIGHT (CATHETERS) ×2 IMPLANT
CLOSURE MYNX CONTROL 5F (Vascular Products) ×2 IMPLANT
KIT HEART LEFT (KITS) ×2 IMPLANT
PACK CARDIAC CATHETERIZATION (CUSTOM PROCEDURE TRAY) ×2 IMPLANT
SHEATH PINNACLE 5F 10CM (SHEATH) ×2 IMPLANT
SHEATH PINNACLE 7F 10CM (SHEATH) ×2 IMPLANT
TRANSDUCER W/STOPCOCK (MISCELLANEOUS) ×2 IMPLANT
TUBING CIL FLEX 10 FLL-RA (TUBING) ×2 IMPLANT
WIRE EMERALD 3MM-J .035X150CM (WIRE) ×2 IMPLANT
WIRE HI TORQ VERSACORE-J 145CM (WIRE) ×2 IMPLANT

## 2018-10-09 NOTE — Telephone Encounter (Signed)
RN Butch Penny from [facility] reached out to office as patient on both Plavix and Uptravi.  Per Butch Penny, manufacture recommends patient change dose to once daily while on plavix.  D/w Dr. Aundra Dubin, advised to change dosing to once daily until patient completes plavix therapy for approximately another 2 months.  New Rx faxed to [facility] reflecting change.   Pt currently hospitalized

## 2018-10-09 NOTE — Progress Notes (Signed)
Pt's mouth/lip started bleeding this morning. Heparin stopped, pharmacy, MD, and Np made aware. Pt denied any pain and stated she did not hurt her mouth. However, she did say she was picking at her lips. Blood was noted in the back of her throat. Np and pharmacist came by to see pt and stated they did not see blood in her throat. Heparin restarted. Daughter at bedside and pt resting in bed. Will cont to monitor pt.

## 2018-10-09 NOTE — Progress Notes (Addendum)
Advanced Heart Failure Rounding Note  PCP-Cardiologist: Jenkins Rouge, MD   Subjective:    Cr stable at 0.99. I/O negative 700 cc. with IV lasix yesterday. Weight down 1 lb.   Feeling OK this am, no questions about procedure. No further chest pain. Afebrile. SOB much improved.   Lexiscan Myoview 10/08/18  1. Moderate-sized stress-induced perfusion defect in the inferior wall. Fixed defect at the apex is compatible with scar. 2. Normal left ventricular wall motion. 3. Left ventricular ejection fraction 53% 4. Non invasive risk stratification*: Intermediate risk.  Objective:   Weight Range: 62.3 kg Body mass index is 25.13 kg/m.   Vital Signs:   Temp:  [97.5 F (36.4 C)-98.5 F (36.9 C)] 97.9 F (36.6 C) (03/06 0541) Pulse Rate:  [72-84] 81 (03/06 0541) Resp:  [16-23] 23 (03/06 0541) BP: (107-181)/(44-78) 136/63 (03/06 0541) SpO2:  [91 %-97 %] 97 % (03/06 0541) Weight:  [62.3 kg-63 kg] 62.3 kg (03/06 0551) Last BM Date: 10/07/18  Weight change: Filed Weights   10/07/18 0433 10/08/18 1307 10/09/18 0551  Weight: 66 kg 63 kg 62.3 kg    Intake/Output:   Intake/Output Summary (Last 24 hours) at 10/09/2018 0723 Last data filed at 10/09/2018 0600 Gross per 24 hour  Intake 811.75 ml  Output 1550 ml  Net -738.25 ml      Physical Exam    General: NAD.  HEENT: Normal Neck: Supple. JVP 6-7 cm. Carotids 2+ bilat; no bruits. No thyromegaly or nodule noted. Cor: PMI nondisplaced. RRR, No M/G/R noted Lungs: CTAB, normal effort. Abdomen: Soft, non-tender, non-distended, no HSM. No bruits or masses. +BS  Extremities: No cyanosis, clubbing, or rash. R and LLE no edema.  Neuro: Alert & orientedx3, cranial nerves grossly intact. moves all 4 extremities w/o difficulty. Affect pleasant   Telemetry   NSR 70-80s, personally reviewed.   EKG    No new tracings.    Labs    CBC Recent Labs    10/08/18 0500 10/09/18 0308  WBC 7.8 9.5  HGB 10.7* 10.6*  HCT 35.0* 34.9*    MCV 83.5 83.1  PLT 263 225   Basic Metabolic Panel Recent Labs    10/07/18 0203 10/08/18 0500 10/09/18 0308  NA 140 141 139  K 3.3* 4.1 3.5  CL 106 103 101  CO2 _0 GLUCOSE 169* 151* 152*  BUN 14 16 24*  CREATININE 0.73 1.04* 0.99  CALCIUM 9.1 9.3 9.2  MG 1.6* 1.8  --    Liver Function Tests Recent Labs    10/06/18 0729  AST 18  ALT 14  ALKPHOS 55  BILITOT 0.4  PROT 7.4  ALBUMIN 3.3*   No results for input(s): LIPASE, AMYLASE in the last 72 hours. Cardiac Enzymes Recent Labs    10/06/18 1048 10/06/18 1800 10/07/18 0203  TROPONINI 0.04* 0.05* 0.04*    BNP: BNP (last 3 results) Recent Labs    05/08/18 1201 10/07/18 0203  BNP 1,105.0* 1,399.3*    ProBNP (last 3 results) No results for input(s): PROBNP in the last 8760 hours.   D-Dimer No results for input(s): DDIMER in the last 72 hours. Hemoglobin A1C Recent Labs    10/07/18 0203  HGBA1C 7.3*   Fasting Lipid Panel Recent Labs    10/07/18 0203  CHOL 88  HDL 32*  LDLCALC 33  TRIG 113  CHOLHDL 2.8   Thyroid Function Tests Recent Labs    10/07/18 0712  TSH 0.876    Other results:  Imaging    Nm Myocar Multi W/spect W/wall Motion / Ef  Result Date: 10/08/2018 CLINICAL DATA:  Acute coronary syndrome EXAM: MYOCARDIAL IMAGING WITH SPECT (REST AND PHARMACOLOGIC-STRESS) GATED LEFT VENTRICULAR WALL MOTION STUDY LEFT VENTRICULAR EJECTION FRACTION TECHNIQUE: Standard myocardial SPECT imaging was performed after resting intravenous injection of 10 mCi Tc-54mtetrofosmin. Subsequently, intravenous infusion of Lexiscan was performed under the supervision of the Cardiology staff. At peak effect of the drug, 30 mCi Tc-943metrofosmin was injected intravenously and standard myocardial SPECT imaging was performed. Quantitative gated imaging was also performed to evaluate left ventricular wall motion, and estimate left ventricular ejection fraction. COMPARISON:  None. FINDINGS: Perfusion: There  is a moderate-sized fixed perfusion defect at the apex. There is very poor perfusion throughout the inferior wall on stress imaging. It largely reperfuses on rest imaging. This is compatible with a moderate-sized stress-induced perfusion defect. Wall Motion: Normal left ventricular wall motion. No left ventricular dilation. Left Ventricular Ejection Fraction: 53 % End diastolic volume 67 ml End systolic volume 32 ml IMPRESSION: 1. Moderate-sized stress-induced perfusion defect in the inferior wall. Fixed defect at the apex is compatible with scar. 2. Normal left ventricular wall motion. 3. Left ventricular ejection fraction 53% 4. Non invasive risk stratification*: Intermediate risk. *2012 Appropriate Use Criteria for Coronary Revascularization Focused Update: J Am Coll Cardiol. 203382;50(5):397-673http://content.onairportbarriers.comspx?articleid=[email or web address] Electronically Signed   By: ArMarybelle Killings.D.   On: 10/08/2018 13:43     Medications:     Scheduled Medications: . aspirin EC  81 mg Oral Daily  . atorvastatin  80 mg Oral Daily  . carvedilol  12.5 mg Oral BID WC  . clopidogrel  75 mg Oral Daily  . furosemide  40 mg Oral Daily  . gabapentin  300 mg Oral QHS  . macitentan  10 mg Oral Daily  . mirtazapine  30 mg Oral QHS  . pantoprazole  40 mg Oral Daily  . sildenafil  20 mg Oral TID  . sodium chloride flush  3 mL Intravenous Q12H    Infusions: . sodium chloride    . sodium chloride 10 mL/hr at 10/09/18 0600  . sodium chloride    . heparin 1,000 Units/hr (10/09/18 0600)    PRN Medications: sodium chloride, acetaminophen, ALPRAZolam, morphine injection, ondansetron (ZOFRAN) IV, sodium chloride, sodium chloride flush    Patient Profile   89.o.with history of DMII, HTN, GERD, HTN, prior osteomyelitis of jaw pulmonary hypertension/RV failure, and CAD.   Admitted with chest pain and fever  Assessment/Plan   1. Febrile illness: Admitted with T > 101 and chest pain.   - Tmax  98.5. WBCs 9.5. Cultures negative so far, no definite PNA on CXR. Respiratory virus panel negative.  Not currently on abx, got vancomycin and cefepime in ER.  - Per primary.  2. CAD: Coronary angiography in 10/19 showed complex bifurcation lesion with 95% stenosis proximal LAD with calcification at take-off of large D2. The ostial/proximal D2 also had 95% calcified stenosis. Suspect this lesion played a role in her exertional dyspnea and also in her mildly decreased LV systolic function (EF 4541-93%n echo). S/p successful atherectomy with bifurcation stenting of proximal LAD and D2 05/15/18.She had chest pain at admission, very mild TnI elevation (0.05) without trend, nonspecific ECG changes.  Given prolonged CP and no significant rise in TnI, doubt ACS.  Possible demand ischemia with volume overload.  Echo this admission with EF 55-60%.   - Continue ASA 81and Plavix. - Continue atorvastatin, good lipids this  admission. - Lexiscan Cardiolite done 10/08/18 with moderate sized stress-induced defect. In setting of chest pain, plan LHC today.  3.Pulmonary hypertension: She appearedto have at least moderate pulmonary hypertension by echoin 7/19with RV failure. CT chest showed mild emphysema, which should not explain her degree of RV failure. V/Q scan did not show evidence for chronic PEs.Anti-centromere antibody, ANA, and RF were all positive. ?CREST variant of scleroderma.She remains quite symptomatic and is on home oxygen.[place] 05/15/18 showed moderate PAH but very high PVR and low cardiac output. This is concerning for advanced pulmonary hypertension.  - Continued on sildenafil20 mg TID.  - Continue Opsumit 10 mg daily.  -She is awaiting selexipag (has not arrived at her house yet). Because of interaction with Plavix, she will take selexipag once daily rather than twice daily.  - Refuses sleep study.  - No change.  4. Acute on chronic diastolic CHF/RV failure: Echo with EF 55-60%,  moderately decreased RV systolic function.  Mild TnI elevation is likely demand ischemia from volume overload.  - Stable with one dose of IV lasix yesterday. Switch to po lasix 40 mg this am.  5.Suspect CREST syndrome: -  She missed her appointment with rheumatology, Dr. Amil Amen. Will re-refer.   Plan for LHC this afternoon. Disposition pending.   Length of Stay: 3  Annamaria Helling  10/09/2018, 7:23 AM  Advanced Heart Failure Team Pager [phone] (M-F; 7a - 4p)  Please contact [facility] for night-coverage after hours (4p -7a ) and weekends on [email or web address]  Patient seen with PA, agree with the above note.   We did cardiac cath today, stents from prior procedure were patent, no obstructive disease.  Suspect false positive Cardiolite.  Chest pain was not ischemic.   She got a Mynx closure device.  I think she could go home today after bedrest.    Meds for home:  Sildenafil 20 mg tid, Opsumit 10 mg daily, Plavix 75 mg daily, ASA 81 daily, Lasix 40 mg daily, atorvastatin 80 daily, Coreg 12.5 mg bid, KCl 20 daily.    She will need followup in CHF clinic.   Loralie Champagne 10/09/2018 1:38 PM

## 2018-10-09 NOTE — Interval H&P Note (Signed)
History and Physical Interval Note:  10/09/2018 1:02 PM  [name]  has presented today for surgery, with the diagnosis of CAD  The various methods of treatment have been discussed with the patient and family. After consideration of risks, benefits and other options for treatment, the patient has consented to  Procedure(s): RIGHT/LEFT HEART CATH AND CORONARY ANGIOGRAPHY (N/A) as a surgical intervention .  The patient's history has been reviewed, patient examined, no change in status, stable for surgery.  I have reviewed the patient's chart and labs.  Questions were answered to the patient's satisfaction.     Jotham Ahn [employer]

## 2018-10-09 NOTE — Progress Notes (Signed)
ANTICOAGULATION CONSULT NOTE - Follow Up Consult  Pharmacy Consult for Heparin Indication: chest pain/ACS  Allergies  Allergen Reactions  . Sulfa Antibiotics Swelling    Mouth and tongue swelling    Patient Measurements: Height: _0  (157.5 cm) Weight: 137 lb 6.4 oz (62.3 kg) IBW/kg (Calculated) : 50.1 Heparin Dosing Weight: 63.8 kg  Vital Signs: Temp: 97.9 F (36.6 C) (03/06 0541) Temp Source: Oral (03/06 0541) BP: 136/63 (03/06 0541) Pulse Rate: 81 (03/06 0541)  Labs: Recent Labs    10/06/18 1048  10/06/18 1800 10/07/18 0203 10/07/18 0940 10/08/18 0500 10/09/18 0308  HGB  --   --   --  9.7*  --  10.7* 10.6*  HCT  --   --   --  33.2*  --  35.0* 34.9*  PLT  --   --   --  251  --  263 283  HEPARINUNFRC  --    < > 0.16* 0.31 0.31 0.31 0.49  CREATININE  --   --   --  0.73  --  1.04* 0.99  TROPONINI 0.04*  --  0.05* 0.04*  --   --   --    < > = values in this interval not displayed.    Estimated Creatinine Clearance: 36.7 mL/min (by C-G formula based on SCr of 0.99 mg/dL).   Medications:  Scheduled:  . aspirin EC  81 mg Oral Daily  . atorvastatin  80 mg Oral Daily  . carvedilol  12.5 mg Oral BID WC  . clopidogrel  75 mg Oral Daily  . furosemide  40 mg Oral Daily  . gabapentin  300 mg Oral QHS  . macitentan  10 mg Oral Daily  . mirtazapine  30 mg Oral QHS  . pantoprazole  40 mg Oral Daily  . sildenafil  20 mg Oral TID  . sodium chloride flush  3 mL Intravenous Q12H   Infusions:  . sodium chloride    . sodium chloride 10 mL/hr at 10/09/18 0600  . sodium chloride    . heparin 1,000 Units/hr (10/09/18 0600)    Assessment: 85 yo F admitted with chest pain.  Pt initiated on heparin.  Pt is s/p cardiolyte with abnormal results.  Plan for cardiac cath 3/6.  Pt continues to be therapeutic on heparin at 1000 units/hr.    Goal of Therapy:  Heparin level 0.3-0.7 units/ml Monitor platelets by anticoagulation protocol: Yes   Plan:  Continue heparin at 1000  units/hr. Follow-up after cath.  [employer], Pharm.D., BCPS Clinical Pharmacist  **Pharmacist phone directory can now be found on [email or web address] ([email or web address]).  Listed under [place].  10/09/2018 6:44 AM

## 2018-10-09 NOTE — Progress Notes (Addendum)
Family Medicine Teaching Service Daily Progress Note Intern Pager: [phone]  Patient name: [name] Medical record number: [id number] Date of birth: [birth year] Age: 85 y.o. Gender: female  Primary Care Provider: [name], PA-C Consultants: cardio Code Status: DNR  Pt Overview and Major Events to Date:  3/5: lexiscan and stress test 3/6: cath  Assessment and Plan: [name] a 85 y.o.femalepresenting with chest pain radiating to the shoulders and back. PMH is significant forCAD s/pstents, pulmonary HTN,GERD, HTN,HLD, GERD, Hx CVA, Depression, CKD III, h/o Osteomyelitis of Jaw, ?T2DM.  Chest Pain 2/2Unstable Angina (resolved), h/o CAD s/p stent, CHF NYHA III Chest pain continues to be resolved. Underwent Cardiolite lexiscan with Cardiology and showed moderate size induced perfusion defect in inferior wall. Remains on heparin drip. Stress test came back abnormal. Patient will undergo cath today.  - continue morphine for now, can consider d/c and add back home tramadol once plan from Cardiology confirmed. -IV heparin per Cardiology -Continue ASA and plavix -Appreciate cardiology input - zofran prn - oxygen therapy as needed, on 4L O2 at home per daughter -lasix per Cards, restarted lasix at 40 mg today, will d/c on this dose -Undergoing cath, disposition pending results.  Fever, resolved Patient is afebrilesince 5pm 3/3and no longer has elevated leukocytosis.Likely viral etiology as patient now reports slight nasal congestion. Blood and urine culture negative, RVP negative. Influenza A and B negative.Bibasilar rales improved with diuresis, less likely pneumonia.  Pulmonary HTN, chronic Followed by Dr. Aundra Dubin with HF. Most recent estimated PA pressure 61 mmHg, L/RHC 05/2018. Underwent rheumatological work up concerning for possible CREST syndrome. On opsumit, sildenafil at home. Able to walk 112f at baseline without SOB. -will continue home medication of  opsumit andsildenafil.  HTN, chronic Blood pressure remains intermittently elevated, most recent wnl.On coreg, lasixat home.  -continue coreg and lasix as above  HLD Lipid panel WNL,on high dose statin - continue lipitor  GERD May contribute to chest pain as above. -Continue PPI  Normocytic Anemia Hb 10 on admission (stable from 1 month ago), stable. Baseline ~9-10. Of note had acute GI bleed during 12/2016 admission for OM of the jaw. No current evidence of bleeding. - monitor - consider iron panel in outpatient setting  Hypokalemia, resolved K 3.5. - monitor  AKI on ?CKD III, resolved Cr .99 this am s/p Lasix.  - monitor  ?T2DM Noted per chart although daughter states only had issues with her blood sugars when she was admitted for sepsis 12/2016 2/2 osteomyelitis of the jaw and not currently on medication. Per chart review, was previously on metformin. Glucose on admission 198.A1c: 7.3.Appropriate for age.  - monitor with BMP  Chronic back pain Takes tramadol-APAP at home. Currently holding as she is receiving morphine.  - hold home meds, monitor  FEN/GI:Cardiac diet, PPI/GI cocktail Prophylaxis:heparingtt  Disposition: No clear infectious etiology. Most likely was viral infection. Cardiology proceeding with cath today. Disposition pending results.  Subjective:  Patient was examined this AM at bedside and is feeling great. She states she understands and was informed on her heart condition and is ready for the cath. No overnight events or chest pain.   Objective: Temp:  [97.5 F (36.4 C)-98.5 F (36.9 C)] 97.9 F (36.6 C) (03/06 0541) Pulse Rate:  [72-81] 81 (03/06 0541) Resp:  [16-23] 23 (03/06 0541) BP: (107-181)/(44-76) 136/63 (03/06 0541) SpO2:  [96 %-97 %] 97 % (03/06 0541) Weight:  [62.3 kg-63 kg] 62.3 kg (03/06 0551) Physical Exam: Physical Exam  Constitutional: She  is oriented to person, place, and time and well-developed,  well-nourished, and in no distress. No distress.  HENT:  Head: Normocephalic and atraumatic.  Cardiovascular: Normal rate, regular rhythm and normal heart sounds.  Pulmonary/Chest: Effort normal and breath sounds normal. No respiratory distress. She has no wheezes.  Neurological: She is alert and oriented to person, place, and time. Gait normal.  Skin: Skin is warm. She is not diaphoretic.  Psychiatric: Mood, memory, affect and judgment normal.    Laboratory: Recent Labs  Lab 10/07/18 0203 10/08/18 0500 10/09/18 0308  WBC 8.7 7.8 9.5  HGB 9.7* 10.7* 10.6*  HCT 33.2* 35.0* 34.9*  PLT 251 263 283   Recent Labs  Lab 10/06/18 0729 10/07/18 0203 10/08/18 0500 10/09/18 0308  NA 138 140 141 139  K 3.8 3.3* 4.1 3.5  CL 104 106 103 101  CO2 _0 BUN _1 24*  CREATININE 0.84 0.73 1.04* 0.99  CALCIUM 9.4 9.1 9.3 9.2  PROT 7.4  --   --   --   BILITOT 0.4  --   --   --   ALKPHOS 55  --   --   --   ALT 14  --   --   --   AST 18  --   --   --   GLUCOSE 198* 169* 151* 152*    Imaging/Diagnostic Tests: Lexiscan: 1. Moderate-sized stress-induced perfusion defect in the inferior wall. Fixed defect at the apex is compatible with scar.  2. Normal left ventricular wall motion.  3. Left ventricular ejection fraction 53%  [name], Medical Student 10/09/2018, 9:12 AM PGY-0, [place] Intern pager: [phone], text pages welcome  FPTS Upper-Level Resident Addendum   I have independently interviewed and examined the patient. I have discussed the above with the original author and agree with their documentation. My edits for correction/addition/clarification are in green. Please see also any attending notes.    Joy Percy, DO PGY-2, [facility] 10/09/2018 3:13 PM  FPTS Service pager: [phone] (text pages welcome through [facility])

## 2018-10-09 NOTE — Care Management Important Message (Signed)
Important Message  Patient Details  Name: [name] MRN: [id number] Date of Birth: 06-May-1932   Medicare Important Message Given:  Yes    Kamel Haven P [place] 10/09/2018, 11:17 AM

## 2018-10-09 NOTE — Discharge Summary (Signed)
[facility] Discharge Summary  Patient name: [name] Medical record number: [id number] Date of birth: June 12, 1932 Age: 85 y.o. Gender: female Date of Admission: 10/06/2018  Date of Discharge: 10/10/2018 Admitting Physician: Rory Percy, DO  Primary Care Provider: [name], PA-C Consultants: Cardiology  Indication for Hospitalization: Chest pain concerning for ACS  Discharge Diagnoses/Problem List:  Chest pain 2/2 Unstable angina with hx of CAD s/p stent, CHF NYHA III Fever Pulmonary HTN HLD HTN GERD Normocytic anemia Hpokalemia AKI on CKD III T2DM Chronic back pain   Disposition: Discharge home in stable condition  Discharge Condition: Stable, improved  Discharge Exam:  BP 137/64 (BP Location: Left Arm)   Pulse 76   Temp 98 F (36.7 C)   Resp 17   Ht _0  (1.575 m)   Wt 64.5 kg Comment: pt. was upset about having to stand so she refused  SpO2 95%   BMI 26.01 kg/m  Gen: NAD, alert, oriented   CV: Regular rate and rhythm.  Normal capillary refill bilaterally.  Radial pulses 2+ bilaterally. No bilateral lower extremity edema. Resp: Clear to auscultation bilaterally.  No wheezing, rales, abnormal lung sounds.  No increased work of breathing appreciated. Abd: Nontender and nondistended on palpation to all 4 quadrants.  Positive bowel sounds. Psych: Cooperative with exam. Pleasant. Makes eye contact. . Brief Hospital Course:  #Chest pain 2/2 Unstable angina with hx of CAD s/p stent, CHF NYHA III: Patient presented with chest pain on admission and was resolved the next day. Troponin x3 were negative. EKG was stable. Due to a spike of fever cardiology deferred a cath on the day of admission. She was started on heparin drip. Once she was afebrile she underwent Cardiolite lexiscan with Cardiology and showed moderate size induced perfusion defect in inferior wall. After those results cardiology decided to do a stress test which came back  abnormal. Patient underwent a cath 3/6 which did not show any obvious vessel occlusions. Patient continued her home ASA and Plavix and will continue those medications upon discharge. Coreg was increased to 12.5 and furosemide increased to 59m   #Fever: Patient spiked one fever on admission at 101 with a slightly elevated leukocytosis of 11.4, she has been afebrilesince 5pm 3/3and no longer has elevated leukocytosis.Likely viral etiology as patient now reports slight nasal congestion. Blood and urine culture were negative, RVP negative.Influenza A and B negative.Bibasilar rales that were heard on admission improved with diuresis.  #Pulmonary HTN: Patient is followed in the outpatient setting by Dr. MAundra Dubinwith HF. Most recent estimated PA pressure 61 mmHg, L/RHC 05/2018. Patient is on opsumit, sildenafil at home, which was continued in the hospital. Patient started on selexipag on discharge.   #HLD: Lipid panel was ordered and within normal limits. Patient continued her home statin.  #HTN: Patient's blood pressure was elevated throughout the hospitalization and the patient's home dose of coreg was increased and lasix increased to 446m  #GERD: Patient is on Pepcid at home, was given a PPI during her hospitalization.   #Normocytic anemia: Patient's Hb was 10 on admission (stable from 1 month ago),currently is 9.7. Baseline is around ~9-10.  No current evidence of bleeding. PCP should consider iron panelin outpatient setting  #Hypokalemia: Patient was hypokalemic and received one dose of 40 mg of KDUR. Current level is 3.5.  #AKI on CKD III: Patient's creatinine was elevated on admission at 1.04. This morning it was 0.99 s/p Lasix. Patient was on IV lasix during the  hospitalization and per cards recommendation will be on PO upon discharge.  #T2DM: This was noted per chart although daughter stated in the ED that her only issues with her blood sugars was when she was admitted for sepsis  12/2016 2/2 osteomyelitis of the jaw and not currently on medication. Per chart review, was previously on metformin. Glucose on admission 198.A1c: 7.3.Appropriate for age.No acute intervention was done.  #Chronic back pain: Patient takes tramadol-APAP at home for it and it was held the entire hospitalziton as patient was receiving morphine.   Issues for Follow Up:  1. Follow up with PCP for chronic anemia, consider anemia panel 2. Follow up with PCP for elevated BG and possibility of starting diabetic medication 3. Follow up on cards outpatient followup.  4. pulm hypertension - patient starting new medication, selexipag.  Assess for tolerance and compliance.  5. CHF - patient coreg and lasix increased.  Assess for tolerance. Consider BMP.    Significant Procedures:   Cath: no significant blockages requiring stents.  Left Main  No significant disease.  Left Anterior Descending  Patent bifurcation stents in proximal and D1. 40% mid LAD stenosis at D2.  Left Circumflex  Luminal irregularities.  Right Coronary Artery  30% mid RCA stenosis. 30% proximal PDA stenosis.  Intervention No interventions have been documented.  Lexiscan: IMPRESSION: 1. Moderate-sized stress-induced perfusion defect in the inferior wall. Fixed defect at the apex is compatible with scar.  2. Normal left ventricular wall motion.  3. Left ventricular ejection fraction 53%  4. Non invasive risk stratification*: Intermediate risk  Echo:  1. The left ventricle has normal systolic function, with an ejection fraction of 55-60%. The cavity size was normal. There is moderately increased left ventricular wall thickness. Left ventricular diastolic Doppler parameters are consistent with  impaired relaxation.  2. The right ventricle has moderately reduced systolic function. The cavity was mildly enlarged. There is no increase in right ventricular wall thickness. Right ventricular systolic pressure is severely  elevated with an estimated pressure of 64.3 mmHg.  3. The mitral valve is normal in structure. Mild thickening of the mitral valve leaflet. There is mild mitral annular calcification present.  4. The tricuspid valve is normal in structure. Tricuspid valve regurgitation is mild-moderate.  5. The aortic valve is tricuspid Mild thickening of the aortic valve Mild calcification of the aortic valve.  6. The pulmonic valve was normal in structure. Pulmonic valve regurgitation is mild to moderate is mild by color flow Doppler.  7. The inferior vena cava was dilated in size with <50% respiratory variability.  Significant Labs and Imaging:  Recent Labs  Lab 10/08/18 0500 10/09/18 0308 10/10/18 0348  WBC 7.8 9.5 8.8  HGB 10.7* 10.6* 10.2*  HCT 35.0* 34.9* 34.3*  PLT 263 283 305   Recent Labs  Lab 10/06/18 0729 10/07/18 0203 10/08/18 0500 10/09/18 0308 10/10/18 0348  NA 138 140 141 139 140  K 3.8 3.3* 4.1 3.5 4.0  CL 104 106 103 101 103  CO2 _0 GLUCOSE 198* 169* 151* 152* 169*  BUN _1 24* 30*  CREATININE 0.84 0.73 1.04* 0.99 1.10*  CALCIUM 9.4 9.1 9.3 9.2 9.0  MG  --  1.6* 1.8  --   --   ALKPHOS 55  --   --   --   --   AST 18  --   --   --   --   ALT 14  --   --   --   --  ALBUMIN 3.3*  --   --   --   --     Results/Tests Pending at Time of Discharge: none  Discharge Medications:  Allergies as of 10/10/2018      Reactions   Sulfa Antibiotics Swelling   Mouth and tongue swelling      Medication List    TAKE these medications   ALPRAZolam 0.25 MG tablet Commonly known as:  XANAX Take 0.25 mg by mouth daily as needed for anxiety.   aspirin EC 81 MG tablet Take 1 tablet (81 mg total) by mouth daily.   atorvastatin 80 MG tablet Commonly known as:  LIPITOR Take 1 tablet (80 mg total) by mouth daily.   carvedilol 12.5 MG tablet Commonly known as:  COREG Take 1 tablet (12.5 mg total) by mouth 2 (two) times daily with a meal. What changed:     medication strength  how much to take   chlorhexidine 0.12 % solution Commonly known as:  PERIDEX 15 mLs by Mouth Rinse route 2 (two) times daily as needed (sore gums).   clopidogrel 75 MG tablet Commonly known as:  PLAVIX Take 1 tablet (75 mg total) by mouth daily.   cyclobenzaprine 10 MG tablet Commonly known as:  Flexeril Take 1 tablet (10 mg total) by mouth 3 (three) times daily as needed for muscle spasms. What changed:  when to take this   furosemide 40 MG tablet Commonly known as:  LASIX Take 1 tablet (40 mg total) by mouth daily. Start taking on:  October 11, 2018 What changed:    medication strength  how much to take   gabapentin 300 MG capsule Commonly known as:  NEURONTIN Take 300 mg by mouth at bedtime.   lactose free nutrition Liqd Take 237 mLs by mouth 2 (two) times daily between meals.   macitentan 10 MG tablet Commonly known as:  OPSUMIT Take 1 tablet (10 mg total) by mouth daily. Will need appointment with Dr. Aundra Dubin for further refills What changed:  additional instructions   mirtazapine 30 MG tablet Commonly known as:  REMERON Take 30 mg by mouth at bedtime.   pantoprazole 40 MG tablet Commonly known as:  PROTONIX Take 1 tablet (40 mg total) by mouth 2 (two) times daily. What changed:  when to take this   potassium chloride SA 20 MEQ tablet Commonly known as:  K-DUR,KLOR-CON Take 1 tablet (20 mEq total) by mouth daily.   sildenafil 20 MG tablet Commonly known as:  Revatio Take 1 tablet (20 mg total) by mouth 3 (three) times daily.   traMADol-acetaminophen 37.5-325 MG tablet Commonly known as:  ULTRACET Take 2 tablets by mouth 4 (four) times daily as needed for pain.     Discharge Instructions: Please refer to Patient Instructions section of EMR for full details.  Patient was counseled important signs and symptoms that should prompt return to medical care, changes in medications, dietary instructions, activity restrictions, and follow up  appointments.   Follow-Up Appointments: Follow-up Information    [facility] Follow up on 10/20/2018.   Specialty:  Cardiology Why:  Heart Failure F/U @ 2PM   -Parking in ER lot (enter under blue awning to left of ER), or underneath [address] in the [place] on [address] (garage code:[id number]  , elevator to 1st floor).  -Take all am meds and bring all med bottles Contact information: [address] [id number] [place] [phone]  [name], PA-C Follow up.   Specialty:  Physician Assistant Why:  please schedule a followup with your PCP to discuss changes in your medication Contact information: [place] [id number] [phone]           Benay Pike, MD 10/10/2018, 10:23 AM PGY-1, [place]

## 2018-10-10 LAB — CBC WITH DIFFERENTIAL/PLATELET
Abs Immature Granulocytes: 0.02 10*3/uL (ref 0.00–0.07)
Basophils Absolute: 0.1 10*3/uL (ref 0.0–0.1)
Basophils Relative: 1 %
Eosinophils Absolute: 0.3 10*3/uL (ref 0.0–0.5)
Eosinophils Relative: 4 %
HCT: 34.3 % — ABNORMAL LOW (ref 36.0–46.0)
Hemoglobin: 10.2 g/dL — ABNORMAL LOW (ref 12.0–15.0)
IMMATURE GRANULOCYTES: 0 %
Lymphocytes Relative: 21 %
Lymphs Abs: 1.8 10*3/uL (ref 0.7–4.0)
MCH: 24.9 pg — ABNORMAL LOW (ref 26.0–34.0)
MCHC: 29.7 g/dL — ABNORMAL LOW (ref 30.0–36.0)
MCV: 83.7 fL (ref 80.0–100.0)
Monocytes Absolute: 1 10*3/uL (ref 0.1–1.0)
Monocytes Relative: 11 %
NRBC: 0 % (ref 0.0–0.2)
Neutro Abs: 5.6 10*3/uL (ref 1.7–7.7)
Neutrophils Relative %: 63 %
Platelets: 305 10*3/uL (ref 150–400)
RBC: 4.1 MIL/uL (ref 3.87–5.11)
RDW: 17.5 % — ABNORMAL HIGH (ref 11.5–15.5)
WBC: 8.8 10*3/uL (ref 4.0–10.5)

## 2018-10-10 LAB — BASIC METABOLIC PANEL
Anion gap: 10 (ref 5–15)
BUN: 30 mg/dL — ABNORMAL HIGH (ref 8–23)
CO2: 27 mmol/L (ref 22–32)
Calcium: 9 mg/dL (ref 8.9–10.3)
Chloride: 103 mmol/L (ref 98–111)
Creatinine, Ser: 1.1 mg/dL — ABNORMAL HIGH (ref 0.44–1.00)
GFR calc Af Amer: 53 mL/min — ABNORMAL LOW (ref >60)
GFR calc non Af Amer: 46 mL/min — ABNORMAL LOW (ref >60)
GLUCOSE: 169 mg/dL — AB (ref 70–99)
Potassium: 4 mmol/L (ref 3.5–5.1)
Sodium: 140 mmol/L (ref 135–145)

## 2018-10-10 MED ORDER — FUROSEMIDE 40 MG PO TABS: 40.0000 mg | ORAL_TABLET | Freq: Every day | ORAL | 0 refills | Status: DC

## 2018-10-10 MED ORDER — CARVEDILOL 12.5 MG PO TABS: 12.5000 mg | ORAL_TABLET | Freq: Two times a day (BID) | ORAL | 0 refills | Status: DC

## 2018-10-10 NOTE — Discharge Instructions (Signed)
You have been started on some new medications by the cardiologists and some changes have been made to your previous medications.  You will need to start taking selexipag at home for your pulmonary arterial hypertension.  This will be taken once a day.  You will now also need to take a higher dose of your coreg, which is 12.20m now.  We have also doubled the dose of your furosemide for more effectiveness.  You will now take 425monce a day.  Please schedule an appointment with your primary care doctor and keep your appointment with your cardiologist.    Selexipag tablets What is this medicine? SELEXIPAG (se LEX i pag) is used to treat pulmonary arterial hypertension (PAH). This medicine helps to slow the progression of PAH and lower your risk of being [facility] may be used for other purposes; ask your health care provider or pharmacist if you have questions. COMMON BRAND NAME(S): UPTRAVI What should I tell my health care provider before I take this medicine? They need to know if you have any of these conditions: -an unusual or allergic reaction to selexipag, other medicines, foods, dyes, or preservatives -pregnant or trying to get pregnant -breast-feeding How should I use this medicine? Take this medicine by mouth with a glass of water. Follow the directions on the prescription label. You can take it with or without food. If it upsets your stomach, take it with food. Do not cut, crush, or chew this medicine. Take your medicine at regular intervals. Do not take it more often than directed. Do not stop taking except on your doctor's advice. Talk to your pediatrician regarding the use of this medicine in children. Special care may be needed. Overdosage: If you think you have taken too much of this medicine contact a poison control center or emergency room at once. NOTE: This medicine is only for you. Do not share this medicine with others. What if I miss a dose? If you miss a  dose, take it as soon as you can. If your next dose is to be taken in less than 6 hours, then do not take the missed dose. Take the next dose at your regular time. Do not take double or extra doses. If you miss 3 days or more of your medicine, call your healthcare provider. What may interact with this medicine? Do not take this medicine with any of the following medications: -clopidogrel -gemfibrozil This medicine may interact with the following medications: -deferasirox -rifampin -teriflunomide This list may not describe all possible interactions. Give your health care provider a list of all the medicines, herbs, non-prescription drugs, or dietary supplements you use. Also tell them if you smoke, drink alcohol, or use illegal drugs. Some items may interact with your medicine. What should I watch for while using this medicine? Tell your doctor or healthcare professional if your symptoms do not start to get better or if they get worse. What side effects may I notice from receiving this medicine? Side effects that you should report to your doctor or health care professional as soon as possible: -allergic reactions like skin rash, itching or hives, swelling of the face, lips, or tongue -breathing problems -unusually weak or tired Side effects that usually do not require medical attention (report to your doctor or health care professional if they continue or are bothersome): -diarrhea -flushing -headache -jaw pain -joint pain -loss of appetite -muscle pain -nausea, vomiting This list may not describe all possible side effects. Call  your doctor for medical advice about side effects. You may report side effects to FDA at 1-800-FDA-1088. Where should I keep my medicine? Keep out of the reach of children. Store at room temperature between 15 and 30 degrees C (59 and 86 degrees F). Throw away any unused medicine after the expiration date. NOTE: This sheet is a summary. It may not cover all  possible information. If you have questions about this medicine, talk to your doctor, pharmacist, or health care provider.  2019 Elsevier/Gold Standard (2016-03-01 13:26:14)   Coronary Artery Disease, Female  Coronary artery disease (CAD) is a condition in which the arteries that lead to the heart (coronary arteries) become narrow or blocked. The narrowing or blockage can lead to decreased blood flow to the heart. Prolonged reduced blood flow can cause a heart attack (myocardial infarction or MI). This condition may also be called coronary heart disease. Because CAD is the leading cause of death in women, it is important to understand what causes this condition and how it is treated. What are the causes? CAD is most often caused by atherosclerosis. This is the buildup of fat and cholesterol (plaque) on the inside of the arteries. Over time, the plaque may narrow or block the artery, reducing blood flow to the heart. Plaque can also become weak and break off within a coronary artery and cause a sudden blockage. Other less common causes of CAD include:  An embolism or blood clot in a coronary artery.  A tearing of the artery (spontaneous coronary artery dissection).  An aneurysm.  Inflammation (vasculitis) in the artery wall. What increases the risk? The following factors may make you more likely to develop this condition:  Age. Women over age 93 are at a greater risk of CAD.  Family history of CAD.  High blood pressure (hypertension).  Diabetes.  High cholesterol levels.  Tobacco use.  Lack of exercise.  Menopause. ? All postmenopausal women are at greater risk of CAD. ? Women who have experienced menopause between the ages of 63-45 (early menopause) are at a higher risk of CAD. ? Women who have experienced menopause before age 44 (premature menopause) are at a very high risk of CAD.  Excessive alcohol use  A diet high in saturated and trans fats, such as fried food and  processed meat. Other possible risk factors include:  High stress levels.  Depression  Obesity.  Sleep apnea. What are the signs or symptoms? Many people do not have any symptoms during the early stages of CAD. As the condition progresses, symptoms may include:  Chest pain (angina). The pain can: ? Feel like crushing or squeezing, or a tightness, pressure, fullness, or heaviness in the chest. ? Last more than a few minutes or can stop and recur. The pain tends to get worse with exercise or stress and to fade with rest.  Pain in the arms, neck, jaw, or back.  Unexplained heartburn or indigestion.  Shortness of breath.  Nausea.  Sudden cold sweats.  Sudden light-headedness.  Fluttering or fast heartbeat (palpitations). Many women have chest discomfort and the other symptoms. However, women often have unusual (atypical) symptoms, such as:  Fatigue.  Vomiting.  Unexplained feelings of nervousness or anxiety.  Unexplained weakness.  Dizziness or fainting. How is this diagnosed? This condition is diagnosed based on:  Your family and medical history.  A physical exam.  Tests, including: ? A test to check the electrical signals in your heart (electrocardiogram). ? Exercise stress test. This looks  for signs of blockage when the heart is stressed with exercise, such as running on a treadmill. ? Pharmacologic stress test. This test looks for signs of blockage when the heart is being stressed with a medicine. ? Blood tests. ? Coronary angiogram. This is a procedure to look at the coronary arteries to see if there is any blockage. During this test, a dye is injected into your arteries so they appear on an X-ray. ? A test that uses sound waves to take a picture of your heart (echocardiogram). ? Chest X-ray. How is this treated? This condition may be treated by:  Healthy lifestyle changes to reduce risk factors.  Medicines such as: ? Antiplatelet medicines and  blood-thinning medicines, such as aspirin. These help prevent blood clots. ? Nitroglycerin. ? Blood pressure medicines. ? Cholesterol-lowering medicine.  Coronary angioplasty and stenting. During this procedure, a thin, flexible tube is inserted through a blood vessel and into a blocked artery. A balloon or similar device on the end of the tube is inflated to open up the artery. In some cases, a small, mesh tube (stent) is inserted into the artery to keep it open.  Coronary artery bypass surgery. During this surgery, veins or arteries from other parts of the body are used to create a bypass around the blockage and allow blood to reach your heart. Follow these instructions at home: Medicines  Take over-the-counter and prescription medicines only as told by your health care provider.  Do not take the following medicines unless your health care provider approves: ? NSAIDs, such as ibuprofen, naproxen, or celecoxib. ? Vitamin supplements that contain vitamin A, vitamin E, or both. ? Hormone replacement therapy that contains estrogen with or without progestin. Lifestyle  Follow an exercise program approved by your health care provider. Aim for 150 minutes of moderate exercise or 75 minutes of vigorous exercise each week.  Maintain a healthy weight or lose weight as approved by your health care provider.  Rest when you are tired.  Learn to manage stress or try to limit your stress. Ask your health care provider for suggestions if you need help.  Get screened for depression and seek treatment, if needed.  Do not use any products that contain nicotine or tobacco, such as cigarettes and e-cigarettes. If you need help quitting, ask your health care provider.  Do not use illegal drugs. Eating and drinking  Follow a heart-healthy diet. A dietitian can help educate you about healthy food options and changes. In general, eat plenty of fruits and vegetables, lean meats, and whole grains.  Avoid  foods high in: ? Sugar. ? Salt (sodium). ? Saturated fats, such as processed or fatty meat. ? Trans fats, such as fried food.  Use healthy cooking methods such as roasting, grilling, broiling, baking, poaching, steaming, or stir-frying.  If you drink alcohol, and your health care provider approves, limit your alcohol intake to no more than 1 drink per day. One drink equals 12 ounces of beer, 5 ounces of wine, or 1 ounces of hard liquor. General instructions  Manage any other health conditions, such as hypertension and diabetes. These conditions affect your heart.  Your health care provider may ask you to monitor your blood pressure. Ideally, your blood pressure should be below 130/80.  Keep all follow-up visits as told by your health care provider. This is important. Get help right away if:  You have pain in your chest, neck, arm, jaw, stomach, or back that: ? Lasts more than a few  minutes. ? Is recurring. ? Is not relieved by taking medicine under your tongue (sublingualnitroglycerin).  You have profuse sweating without cause.  You have unexplained: ? Heartburn or indigestion. ? Shortness of breath or difficulty breathing. ? Fluttering or fast heartbeat (palpitations). ? Nausea or vomiting. ? Fatigue. ? Feelings of nervousness or anxiety. ? Weakness. ? Diarrhea.  You have sudden light-headedness or dizziness.  You faint.  You feel like hurting yourself or think about taking your own life. These symptoms may represent a serious problem that is an emergency. Do not wait to see if the symptoms will go away. Get medical help right away. Call your local emergency services (911 in the [place] Do not drive yourself to the hospital. Summary  Coronary artery disease (CAD) is a process in which the arteries that lead to the heart (coronary arteries) become narrow or blocked. The narrowing or blockage can lead to a heart attack.  Many women have chest discomfort and other common  symptoms of CAD. However, women often have different (atypical) symptoms, such as fatigue, vomiting, and dizziness or weakness.  CAD can be treated with lifestyle changes, medicines, surgery, or a combination of these treatments. This information is not intended to replace advice given to you by your health care provider. Make sure you discuss any questions you have with your health care provider. Document Released: 10/14/2011 Document Revised: 07/12/2016 Document Reviewed: 07/12/2016 Elsevier Interactive Patient Education  2019 [employer]-healthy meal planning includes:  Eating less unhealthy fats.  Eating more healthy fats.  Making other changes in your diet. Talk with your doctor or a diet specialist (dietitian) to create an eating plan that is right for you. What is my plan? Your doctor may recommend an eating plan that includes:  Total fat: ______% or less of total calories a day.  Saturated fat: ______% or less of total calories a day.  Cholesterol: less than _________mg a day. What are tips for following this plan? Cooking Avoid frying your food. Try to bake, boil, grill, or broil it instead. You can also reduce fat by:  Removing the skin from poultry.  Removing all visible fats from meats.  Steaming vegetables in water or broth. Meal planning   At meals, divide your plate into four equal parts: ? Fill one-half of your plate with vegetables and green salads. ? Fill one-fourth of your plate with whole grains. ? Fill one-fourth of your plate with lean protein foods.  Eat 4-5 servings of vegetables per day. A serving of vegetables is: ? 1 cup of raw or cooked vegetables. ? 2 cups of raw leafy greens.  Eat 4-5 servings of fruit per day. A serving of fruit is: ? 1 medium whole fruit. ?  cup of dried fruit. ?  cup of fresh, frozen, or canned fruit. ?  cup of 100% fruit juice.  Eat more foods that have soluble fiber. These  are apples, broccoli, carrots, beans, peas, and barley. Try to get 20-30 g of fiber per day.  Eat 4-5 servings of nuts, legumes, and seeds per week: ? 1 serving of dried beans or legumes equals  cup after being cooked. ? 1 serving of nuts is  cup. ? 1 serving of seeds equals 1 tablespoon. General information  Eat more home-cooked food. Eat less restaurant, buffet, and fast food.  Limit or avoid alcohol.  Limit foods that are high in starch and sugar.  Avoid fried foods.  Lose weight if  you are overweight.  Keep track of how much salt (sodium) you eat. This is important if you have high blood pressure. Ask your doctor to tell you more about this.  Try to add vegetarian meals each week. Fats  Choose healthy fats. These include olive oil and canola oil, flaxseeds, walnuts, almonds, and seeds.  Eat more omega-3 fats. These include salmon, mackerel, sardines, tuna, flaxseed oil, and ground flaxseeds. Try to eat fish at least 2 times each week.  Check food labels. Avoid foods with trans fats or high amounts of saturated fat.  Limit saturated fats. ? These are often found in animal products, such as meats, butter, and cream. ? These are also found in plant foods, such as palm oil, palm kernel oil, and coconut oil.  Avoid foods with partially hydrogenated oils in them. These have trans fats. Examples are stick margarine, some tub margarines, cookies, crackers, and other baked goods. What foods can I eat? Fruits All fresh, canned (in natural juice), or frozen fruits. Vegetables Fresh or frozen vegetables (raw, steamed, roasted, or grilled). Green salads. Grains Most grains. Choose whole wheat and whole grains most of the time. Rice and pasta, including brown rice and pastas made with whole wheat. Meats and other proteins Lean, well-trimmed beef, veal, pork, and lamb. Chicken and [place] without skin. All fish and shellfish. Wild duck, rabbit, pheasant, and venison. Egg whites or  low-cholesterol egg substitutes. Dried beans, peas, lentils, and tofu. Seeds and most nuts. Dairy Low-fat or nonfat cheeses, including ricotta and mozzarella. Skim or 1% milk that is liquid, powdered, or evaporated. Buttermilk that is made with low-fat milk. Nonfat or low-fat yogurt. Fats and oils Non-hydrogenated (trans-free) margarines. Vegetable oils, including soybean, sesame, sunflower, olive, peanut, safflower, corn, canola, and cottonseed. Salad dressings or mayonnaise made with a vegetable oil. Beverages Mineral water. Coffee and tea. Diet carbonated beverages. Sweets and desserts Sherbet, gelatin, and fruit ice. Small amounts of dark chocolate. Limit all sweets and desserts. Seasonings and condiments All seasonings and condiments. The items listed above may not be a complete list of foods and drinks you can eat. Contact a dietitian for more options. What foods should I avoid? Fruits Canned fruit in heavy syrup. Fruit in cream or butter sauce. Fried fruit. Limit coconut. Vegetables Vegetables cooked in cheese, cream, or butter sauce. Fried vegetables. Grains Breads that are made with saturated or trans fats, oils, or whole milk. Croissants. Sweet rolls. Donuts. High-fat crackers, such as cheese crackers. Meats and other proteins Fatty meats, such as hot dogs, ribs, sausage, bacon, rib-eye roast or steak. High-fat deli meats, such as salami and bologna. Caviar. Domestic duck and goose. Organ meats, such as liver. Dairy Cream, sour cream, cream cheese, and creamed cottage cheese. Whole-milk cheeses. Whole or 2% milk that is liquid, evaporated, or condensed. Whole buttermilk. Cream sauce or high-fat cheese sauce. Yogurt that is made from whole milk. Fats and oils Meat fat, or shortening. Cocoa butter, hydrogenated oils, palm oil, coconut oil, palm kernel oil. Solid fats and shortenings, including bacon fat, salt pork, lard, and butter. Nondairy cream substitutes. Salad dressings with  cheese or sour cream. Beverages Regular sodas and juice drinks with added sugar. Sweets and desserts Frosting. Pudding. Cookies. Cakes. Pies. Milk chocolate or white chocolate. Buttered syrups. Full-fat ice cream or ice cream drinks. The items listed above may not be a complete list of foods and drinks to avoid. Contact a dietitian for more information. Summary  Heart-healthy meal planning includes eating less unhealthy  fats, eating more healthy fats, and making other changes in your diet.  Eat a balanced diet. This includes fruits and vegetables, low-fat or nonfat dairy, lean protein, nuts and legumes, whole grains, and heart-healthy oils and fats. This information is not intended to replace advice given to you by your health care provider. Make sure you discuss any questions you have with your health care provider. Document Released: 01/21/2012 Document Revised: 08/29/2017 Document Reviewed: 08/29/2017 Elsevier Interactive Patient Education  2019 [employer].

## 2018-10-10 NOTE — Progress Notes (Signed)
Patient ID: [name], female   DOB: [birth year], 85 y.o.   MRN: [id number]  Right groin cath site stable, labs stable.   Can go home today.   Meds for home:  Sildenafil 20 mg tid, Opsumit 10 mg daily, Plavix 75 mg daily, ASA 81 daily, Lasix 40 mg daily, atorvastatin 80 daily, Coreg 12.5 mg bid, KCl 20 daily.  She will be getting Selexipag through specialty pharmacy.   She will need followup in CHF clinic.   Loralie Champagne 10/10/2018 9:12 AM

## 2018-10-11 LAB — CULTURE, BLOOD (ROUTINE X 2)
Culture: NO GROWTH
Culture: NO GROWTH
Special Requests: ADEQUATE
Special Requests: ADEQUATE

## 2018-10-12 ENCOUNTER — Encounter (HOSPITAL_COMMUNITY): Payer: Self-pay | Admitting: Cardiology

## 2018-10-13 ENCOUNTER — Telehealth: Payer: Self-pay | Admitting: Physician Assistant

## 2018-10-13 NOTE — Telephone Encounter (Signed)
Paged by answering service. Recent complex admission. She felt fine since discharge however she is very sleepy today. No chest pain, palpitations, LE edema, orthopnea or dyspnea.   BP of 119/55 HR 89 and SPO2 100% on oxygen.   Daughter is concern about BP, advise her blood pressure is fine. However, she will hold Coreg tonight and see if lethargy improves or not. If BP, elevates about 140s, she will take 1/2 table of coreg. Will call office in morning or go to PCP.

## 2018-10-14 DIAGNOSIS — R0902 Hypoxemia: Secondary | ICD-10-CM | POA: Diagnosis not present

## 2018-10-16 DIAGNOSIS — J9611 Chronic respiratory failure with hypoxia: Secondary | ICD-10-CM | POA: Diagnosis not present

## 2018-10-16 DIAGNOSIS — Z79899 Other long term (current) drug therapy: Secondary | ICD-10-CM | POA: Diagnosis not present

## 2018-10-16 DIAGNOSIS — I272 Pulmonary hypertension, unspecified: Secondary | ICD-10-CM | POA: Diagnosis not present

## 2018-10-16 DIAGNOSIS — I50810 Right heart failure, unspecified: Secondary | ICD-10-CM | POA: Diagnosis not present

## 2018-10-16 DIAGNOSIS — I2510 Atherosclerotic heart disease of native coronary artery without angina pectoris: Secondary | ICD-10-CM | POA: Diagnosis not present

## 2018-10-20 ENCOUNTER — Encounter (HOSPITAL_COMMUNITY): Payer: Medicare Other

## 2018-10-27 ENCOUNTER — Telehealth (HOSPITAL_COMMUNITY): Payer: Self-pay

## 2018-10-27 NOTE — Telephone Encounter (Signed)
Received fax from [place] nurse. Pt is to start uptravi 10/23/2018. Pt and daughter verbalized understanding of all teaching. Nurse states that pt's daughter will be managing the pt's medication and titration.

## 2018-11-03 DIAGNOSIS — R768 Other specified abnormal immunological findings in serum: Secondary | ICD-10-CM | POA: Diagnosis not present

## 2018-11-03 DIAGNOSIS — I270 Primary pulmonary hypertension: Secondary | ICD-10-CM | POA: Diagnosis not present

## 2018-11-04 ENCOUNTER — Encounter (HOSPITAL_COMMUNITY): Payer: Self-pay

## 2018-11-04 ENCOUNTER — Ambulatory Visit (HOSPITAL_COMMUNITY)
Admission: RE | Admit: 2018-11-04 | Discharge: 2018-11-04 | Disposition: A | Discharge status: Home / Self Care | Payer: Medicare Other | Source: Ambulatory Visit | Attending: Cardiology | Admitting: Cardiology

## 2018-11-04 ENCOUNTER — Encounter (HOSPITAL_COMMUNITY): Payer: Medicare Other

## 2018-11-04 ENCOUNTER — Other Ambulatory Visit: Payer: Self-pay

## 2018-11-04 VITALS — Wt 143.0 lb

## 2018-11-04 DIAGNOSIS — I5032 Chronic diastolic (congestive) heart failure: Secondary | ICD-10-CM

## 2018-11-04 DIAGNOSIS — I272 Pulmonary hypertension, unspecified: Secondary | ICD-10-CM | POA: Diagnosis not present

## 2018-11-04 DIAGNOSIS — I25119 Atherosclerotic heart disease of native coronary artery with unspecified angina pectoris: Secondary | ICD-10-CM | POA: Diagnosis not present

## 2018-11-04 NOTE — Addendum Note (Signed)
Encounter addended by: Valeda Malm, RN on: 11/04/2018 3:25 PM  Actions taken: Clinical Note Signed

## 2018-11-04 NOTE — Patient Instructions (Addendum)
No changes for today's visit.  Your physician recommends that you schedule a follow-up appointment in: 2 months. You will be contacted to schedule this appointment.

## 2018-11-04 NOTE — Addendum Note (Signed)
Encounter addended by: Valeda Malm, RN on: 11/04/2018 3:20 PM  Actions taken: Clinical Note Signed

## 2018-11-04 NOTE — Progress Notes (Signed)
Heart Failure TeleHealth Note  Due to national recommendations of social distancing due to [name], telehealth visit is felt to be most appropriate for this patient at this time.  I discussed the limitations, risks, security and privacy concerns of performing an evaluation and management service by telephone and the availability of in person appointments. I also discussed with the patient that there may be a patient responsible charge related to this service. The patient expressed understanding and agreed to proceed.  ID:  [name], DOB 1932/07/28, MRN [id number]  Location: Home  Provider location: [address] [place] Type of Visit: Hospital follow up, Established patient  PCP:  [name], PA-C  Cardiologist:  Jenkins Rouge, MD Primary HF: Dr Aundra Dubin  Chief Complaint: PAH, RV failure, CAD   History of Present Illness: [name] is a 85 y.o. female with a history of DMII, HTN, GERD, HTN, prior osteomyelitis of jaw, pulmonary hypertension/RV failure, and CAD.  Recently admitted with fever and CP. She had an abnormal lexiscan cardiolite on 3/5, so was taken for LHC on 3/6, which showed nonobstructive CAD. BB was increased. Diuresed with IV lasix and was transitioned to lasix 40 mg daily. Fever thought to be related to viral etiology and resolved.   She was seen by Rheumatology 3/31. She was not thought to have CREST syndrome.   She presents for telehealth visit today.  Overall feeling fine. Denies SOB/PND/Orthopnea. Using 4 liters oxygen at home. Appetite ok. No fever or chills.  Taking all medications. She has been intolerant of higher doses of selexipag. Weight at home 143-145 pounds.   Pt denies symptoms of cough, fevers, chills, or new SOB worrisome for COVID 19.    Past Medical History:  Diagnosis Date  . Anxiety   . Arthritis    osteo; "mostly hands, knees, probably back too" (12/09/2016)  . Chronic lower back pain   . Complication of anesthesia 2011    resp distress -on vent after surgery  . Coronary artery disease   . Depression   . Diabetes ([place])   . Esophageal stricture   . GERD (gastroesophageal reflux disease)   . Headache    out grew them  . High cholesterol   . History of blood transfusion 2011; ?03/2012   "related to ORs" (12/09/2016)  . Hypertension   . Intestinal obstruction ([place])   . Macular degeneration of left eye    dx over 55 yrs ago.....hasn't changed much  . Shingles   . Squamous carcinoma 2013   squamas cell on scalp--took 14 radiation tx--2013  . Stroke [employer]) 1967   Mini stroke;  No lasting deficits  . Type II diabetes mellitus ([place]) dx'd 11/2016   Past Surgical History:  Procedure Laterality Date  . ABDOMINAL HYSTERECTOMY    . APPENDECTOMY    . BACK SURGERY    . CATARACT EXTRACTION, BILATERAL Bilateral   . CHOLECYSTECTOMY    . COLON SURGERY    . COLONOSCOPY    . CORONARY ATHERECTOMY N/A 05/15/2018   Procedure: CORONARY ATHERECTOMY;  Surgeon: Leonie Man, MD;  Location: [place] [facility];  Service: Cardiovascular;  Laterality: N/A;  . CORONARY STENT INTERVENTION N/A 05/15/2018   Procedure: CORONARY STENT INTERVENTION;  Surgeon: Leonie Man, MD;  Location: [place] [facility];  Service: Cardiovascular;  Laterality: N/A;  . ESOPHAGOGASTRODUODENOSCOPY (EGD) WITH ESOPHAGEAL DILATION  "several times"  . ESOPHAGOGASTRODUODENOSCOPY (EGD) WITH PROPOFOL N/A 12/31/2016   Procedure: ESOPHAGOGASTRODUODENOSCOPY (EGD) WITH  PROPOFOL;  Surgeon: Wilford Corner, MD;  Location[facility];  Service: Endoscopy;  Laterality: N/A;  . EYE SURGERY    . FRACTURE SURGERY    . IR FLUORO GUIDE CV LINE RIGHT  01/02/2017  . IR US GUIDE VASC ACCESS RIGHT  01/02/2017  . JOINT REPLACEMENT    . LEFT HEART CATH AND CORONARY ANGIOGRAPHY N/A 10/09/2018   Procedure: LEFT HEART CATH AND CORONARY ANGIOGRAPHY;  Surgeon: Larey Dresser, MD;  Location: [place] [facility];  Service: Cardiovascular;  Laterality: N/A;  . LUMBAR DISC  SURGERY  10/25/2014   Right L4-L5 removal of seven free fragments of disk mostly posterolaterally.  Lysis of adhesion.  Microscope/notes 10/26/2014  . LUMBAR LAMINECTOMY/DECOMPRESSION MICRODISCECTOMY Right 08/09/2014   Procedure: Right Lumbar Four to Five Microdiskectomy;  Surgeon: Floyce Stakes, MD;  Location: [facility]URO ORS;  Service: Neurosurgery;  Laterality: Right;  Right L4-5 Microdiskectomy  . ORIF PERIPROSTHETIC FRACTURE  03/31/2012   Procedure: OPEN REDUCTION INTERNAL FIXATION (ORIF) PERIPROSTHETIC FRACTURE;  Surgeon: Mauri Pole, MD;  Location: [facility];  Service: Orthopedics;  Laterality: Left;  Open reduction internal fixation Left distal femur periprosthetic fracture  . RIGHT/LEFT HEART CATH AND CORONARY ANGIOGRAPHY N/A 05/14/2018   Procedure: RIGHT/LEFT HEART CATH AND CORONARY ANGIOGRAPHY;  Surgeon: Larey Dresser, MD;  Location: [place] [facility];  Service: Cardiovascular;  Laterality: N/A;  . SMALL INTESTINE SURGERY  2011   "really bad blockage; went into respiratory distress and was on ventilator after surgery; [facility]"  . SQUAMOUS CELL CARCINOMA EXCISION  ~ 2012   S/P "cut off her head then 15 radiation txs"   . TOTAL KNEE ARTHROPLASTY Bilateral    bilateral     Current Outpatient Medications  Medication Sig Dispense Refill  . ALPRAZolam (XANAX) 0.25 MG tablet Take 0.25 mg by mouth daily as needed for anxiety.     Marland Kitchen aspirin EC 81 MG tablet Take 1 tablet (81 mg total) by mouth daily. 30 tablet 11  . atorvastatin (LIPITOR) 80 MG tablet Take 1 tablet (80 mg total) by mouth daily. 30 tablet 6  . carvedilol (COREG) 12.5 MG tablet Take 1 tablet (12.5 mg total) by mouth 2 (two) times daily with a meal. 60 tablet 0  . chlorhexidine (PERIDEX) 0.12 % solution 15 mLs by Mouth Rinse route 2 (two) times daily as needed (sore gums).   4  . clopidogrel (PLAVIX) 75 MG tablet Take 1 tablet (75 mg total) by mouth daily. 30 tablet 6  . cyclobenzaprine (FLEXERIL) 10 MG tablet Take 1  tablet (10 mg total) by mouth 3 (three) times daily as needed for muscle spasms. (Patient taking differently: Take 10 mg by mouth 2 (two) times daily. ) 30 tablet 0  . furosemide (LASIX) 40 MG tablet Take 1 tablet (40 mg total) by mouth daily. 30 tablet 0  . gabapentin (NEURONTIN) 300 MG capsule Take 300 mg by mouth at bedtime.     . lactose free nutrition (BOOST) LIQD Take 237 mLs by mouth 2 (two) times daily between meals.     . macitentan (OPSUMIT) 10 MG tablet Take 1 tablet (10 mg total) by mouth daily. Will need appointment with Dr. Aundra Dubin for further refills (Patient taking differently: Take 10 mg by mouth daily. ) 30 tablet 1  . mirtazapine (REMERON) 30 MG tablet Take 30 mg by mouth at bedtime.    . pantoprazole (PROTONIX) 40 MG tablet Take 1 tablet (40 mg total) by mouth 2 (two) times daily. (  Patient taking differently: Take 40 mg by mouth daily. ) 120 tablet 0  . potassium chloride SA (K-DUR,KLOR-CON) 20 MEQ tablet Take 1 tablet (20 mEq total) by mouth daily. 30 tablet 6  . Selexipag 200 MCG TABS Take 200 mcg by mouth.    . sildenafil (REVATIO) 20 MG tablet Take 1 tablet (20 mg total) by mouth 3 (three) times daily. 90 tablet 11  . traMADol-acetaminophen (ULTRACET) 37.5-325 MG tablet Take 2 tablets by mouth 4 (four) times daily as needed for pain.  2   No current facility-administered medications for this encounter.     Allergies:   Sulfa antibiotics   Social History:  The patient  reports that she quit smoking about 27 years ago. Her smoking use included cigarettes. She has a 1.00 pack-year smoking history. She has never used smokeless tobacco. She reports that she does not drink alcohol or use drugs.   Family History:  The patient's family history is not on file.   ROS:  Please see the history of present illness.   All other systems are personally reviewed and negative.   Exam:  [facility] Call) Lungs: Normal respiratory effort with conversation. On 4 liters oxygen.   Extremities: no reported edema Neuro: Alert & oriented x 3.   Recent Labs: 10/06/2018: ALT 14 10/07/2018: B Natriuretic Peptide 1,399.3; TSH 0.876 10/08/2018: Magnesium 1.8 10/10/2018: BUN 30; Creatinine, Ser 1.10; Hemoglobin 10.2; Platelets 305; Potassium 4.0; Sodium 140  Personally reviewed   Wt Readings from Last 3 Encounters:  11/04/18 64.9 kg (143 lb)  10/10/18 64.5 kg (142 lb 3.2 oz)  08/27/18 65.6 kg (144 lb 9.6 oz)      ASSESSMENT AND PLAN:  1. PAH: She appearedto have at least moderate pulmonary hypertension by echoin 7/19with RV failure. CT chest showed mild emphysema, which should not explain her degree of RV failure. V/Q scan did not show evidence for chronic PEs.Anti-centromere antibody, ANA, and RF were all positive. ?CREST variant of scleroderma.[place] 05/15/18 showed moderate PAH but very high PVR and low cardiac output. This is concerning for advanced pulmonary hypertension.  - Continue sildenafil 20 mg TID - Continue Opsumit 10 mg daily - Continue selexipag once daily 200 mcg  (not BID due to interaction with plavix)  She is intolerant selexipag twice a day.  - Refuses sleep study. Continue home oxygen.  - Has been referred to rheumatology for further work up of ?CREST syndrome  2. Chronic diastolic/RV failure - NYHA III. Volume status sounds stable. - Continue lasix 40 mg daily  3. CAD - S/p successful atherectomy with bifurcation stenting of proximal LAD and D2 05/15/18. - Abnormal Lexiscan during recent admission. LHC completed 3/6 and showed nonobstructive CAD -No s/s ischemia  - Continue ASA, plavix, statin, BB  COVID screen The patient does not have any symptoms that suggest any further testing/ screening at this time.  Social distancing reinforced today.   Patient Risk: After full review of this patients clinical status, I feel that they are at moderate risk for cardiac decompensation at this time.  Today, I have spent 20 minutes with the patient  with telehealth technology discussing heart failure and pulmonary hypertension.     Follow up with Dr Aundra Dubin in 2 months.   Jeanmarie Hubert, NP  11/04/2018 2:16 PM  [facility] [address] [facility] and [place] [address] [phone] (office) [phone] (fax)

## 2018-11-04 NOTE — Progress Notes (Addendum)
Pt aware of AVS instructions. No questions verbalized.  Pt to receive call from scheduler to come back in 2 months. Pt verbalized understanding.  Message sent to [facility] to schedule patient appt. Copy of AVS mailed.

## 2018-11-09 ENCOUNTER — Other Ambulatory Visit (HOSPITAL_COMMUNITY): Payer: Self-pay | Admitting: *Deleted

## 2018-11-09 MED ORDER — FUROSEMIDE 40 MG PO TABS: 40.0000 mg | ORAL_TABLET | Freq: Every day | ORAL | 0 refills | Status: DC

## 2018-11-11 ENCOUNTER — Other Ambulatory Visit (HOSPITAL_COMMUNITY): Payer: Self-pay

## 2018-11-11 MED ORDER — SELEXIPAG 200 MCG PO TABS: 200.0000 ug | ORAL_TABLET | Freq: Every day | ORAL | 1 refills | Status: DC

## 2018-11-14 DIAGNOSIS — R0902 Hypoxemia: Secondary | ICD-10-CM | POA: Diagnosis not present

## 2018-12-08 ENCOUNTER — Other Ambulatory Visit (HOSPITAL_COMMUNITY): Payer: Self-pay | Admitting: Student

## 2018-12-14 ENCOUNTER — Other Ambulatory Visit (HOSPITAL_COMMUNITY): Payer: Self-pay | Admitting: Cardiology

## 2018-12-14 ENCOUNTER — Other Ambulatory Visit: Payer: Self-pay | Admitting: Physician Assistant

## 2018-12-14 DIAGNOSIS — R0902 Hypoxemia: Secondary | ICD-10-CM | POA: Diagnosis not present

## 2018-12-15 ENCOUNTER — Other Ambulatory Visit (HOSPITAL_COMMUNITY): Payer: Medicare Other

## 2018-12-17 DIAGNOSIS — M1990 Unspecified osteoarthritis, unspecified site: Secondary | ICD-10-CM | POA: Diagnosis not present

## 2018-12-17 DIAGNOSIS — I2510 Atherosclerotic heart disease of native coronary artery without angina pectoris: Secondary | ICD-10-CM | POA: Diagnosis not present

## 2018-12-17 DIAGNOSIS — M545 Low back pain: Secondary | ICD-10-CM | POA: Diagnosis not present

## 2018-12-17 DIAGNOSIS — E119 Type 2 diabetes mellitus without complications: Secondary | ICD-10-CM | POA: Diagnosis not present

## 2018-12-21 ENCOUNTER — Other Ambulatory Visit (HOSPITAL_COMMUNITY): Payer: Self-pay

## 2018-12-21 ENCOUNTER — Other Ambulatory Visit (HOSPITAL_COMMUNITY): Payer: Self-pay | Admitting: Cardiology

## 2018-12-21 MED ORDER — FUROSEMIDE 40 MG PO TABS: 40.0000 mg | ORAL_TABLET | Freq: Every day | ORAL | 0 refills | Status: DC

## 2019-01-14 ENCOUNTER — Other Ambulatory Visit (HOSPITAL_COMMUNITY): Payer: Self-pay | Admitting: Cardiology

## 2019-01-14 DIAGNOSIS — R0902 Hypoxemia: Secondary | ICD-10-CM | POA: Diagnosis not present

## 2019-01-18 DIAGNOSIS — M545 Low back pain: Secondary | ICD-10-CM | POA: Diagnosis not present

## 2019-01-18 DIAGNOSIS — E119 Type 2 diabetes mellitus without complications: Secondary | ICD-10-CM | POA: Diagnosis not present

## 2019-01-18 DIAGNOSIS — I2510 Atherosclerotic heart disease of native coronary artery without angina pectoris: Secondary | ICD-10-CM | POA: Diagnosis not present

## 2019-01-18 DIAGNOSIS — Z79899 Other long term (current) drug therapy: Secondary | ICD-10-CM | POA: Diagnosis not present

## 2019-01-18 DIAGNOSIS — G8929 Other chronic pain: Secondary | ICD-10-CM | POA: Diagnosis not present

## 2019-01-18 DIAGNOSIS — M1990 Unspecified osteoarthritis, unspecified site: Secondary | ICD-10-CM | POA: Diagnosis not present

## 2019-01-19 ENCOUNTER — Encounter (HOSPITAL_COMMUNITY): Payer: Self-pay | Admitting: Cardiovascular Disease

## 2019-01-20 ENCOUNTER — Other Ambulatory Visit (HOSPITAL_COMMUNITY): Payer: Self-pay

## 2019-01-20 MED ORDER — SELEXIPAG 200 MCG PO TABS: 200.0000 ug | ORAL_TABLET | Freq: Every day | ORAL | 11 refills | Status: DC

## 2019-02-13 DIAGNOSIS — R0902 Hypoxemia: Secondary | ICD-10-CM | POA: Diagnosis not present

## 2019-02-25 ENCOUNTER — Telehealth (HOSPITAL_COMMUNITY): Payer: Self-pay | Admitting: Radiology

## 2019-02-25 NOTE — Telephone Encounter (Signed)
Called to schedule echo no answer.

## 2019-02-26 ENCOUNTER — Other Ambulatory Visit (HOSPITAL_COMMUNITY): Payer: Self-pay | Admitting: Cardiology

## 2019-03-04 IMAGING — MR MR LUMBAR SPINE WO/W CM
4 of 7 series · 18 of 48 positions shown · IV contrast (multihance)
Comparison: CT 12/05/2016

CLINICAL DATA: Low back pain. Chills and night sweats. Recent
history of osteomyelitis of the jaw. Previous back surgery.

EXAM:
MRI LUMBAR SPINE WITHOUT AND WITH CONTRAST
TECHNIQUE: Multiplanar and multiecho pulse sequences of the lumbar spine were
obtained without and with intravenous contrast.
CONTRAST:  15 cc MultiHance

[Series 2: T2 · sagittal · 4.0mm · 0.55mm/px · 3 of 14 slices shown (1 of 2)]
[im 1/14]
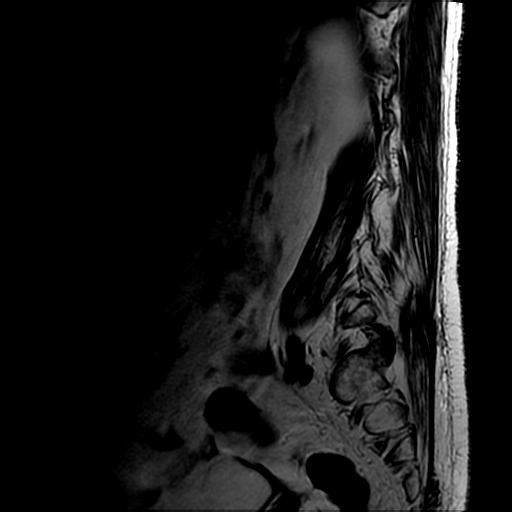
[im 7/14]
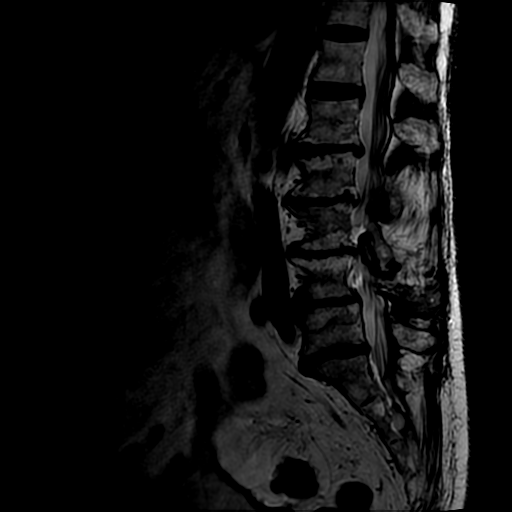
[im 14/14]
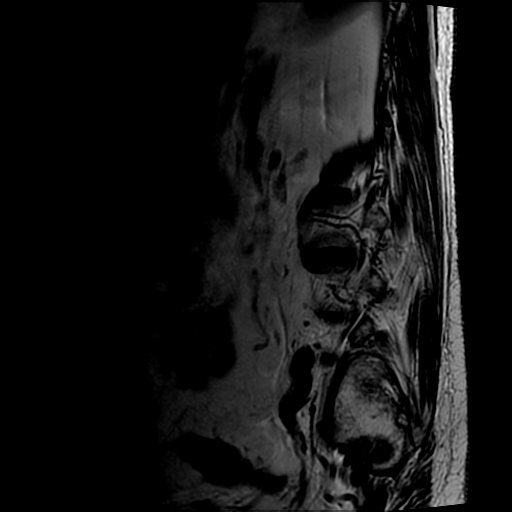

[Series 4: T1 · sagittal · 4.0mm · 0.51mm/px · 3 of 15 slices shown (1 of 2)]
[im 1/15]
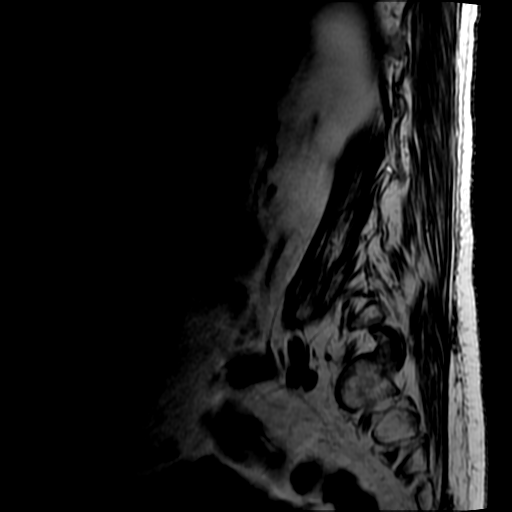
[im 10/15]
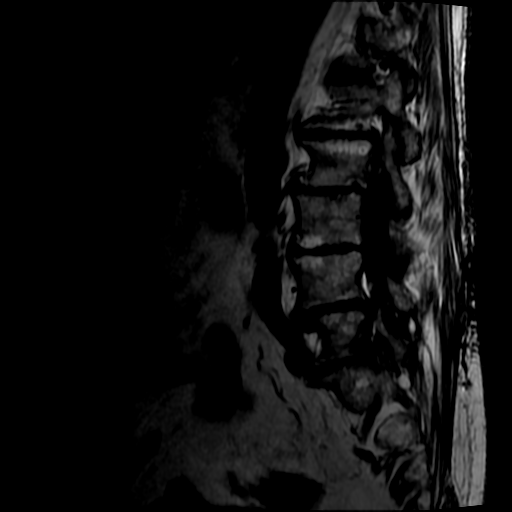
[im 15/15]
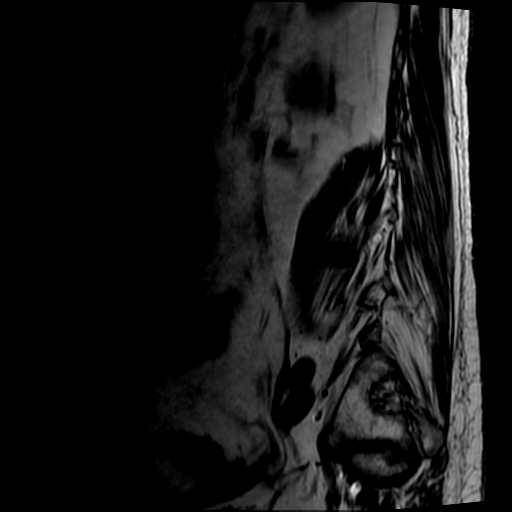

[Series 5: T2 · axial · 4.0mm · 0.39mm/px · z∈[-63,+112]mm · 9 of 40 slices shown (2 of 2)]
[im 1/40]
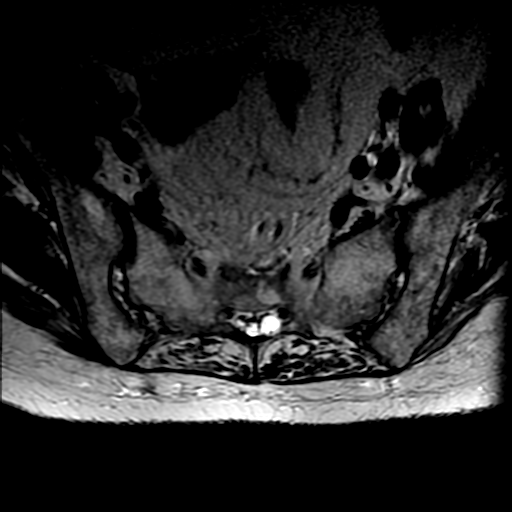
[im 4/40]
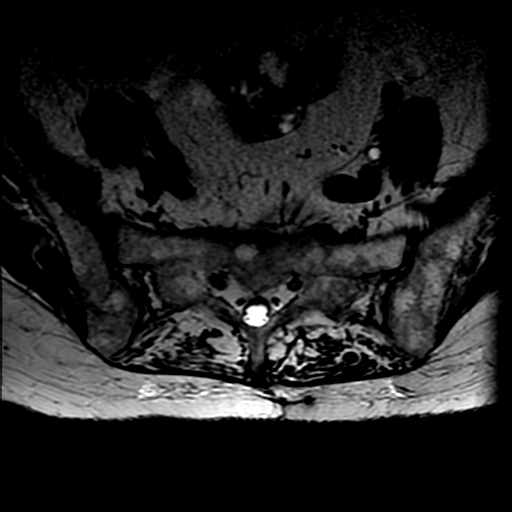
[im 8/40]
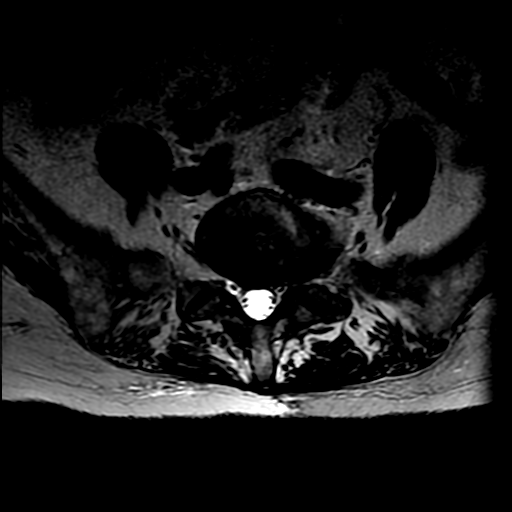
[im 12/40]
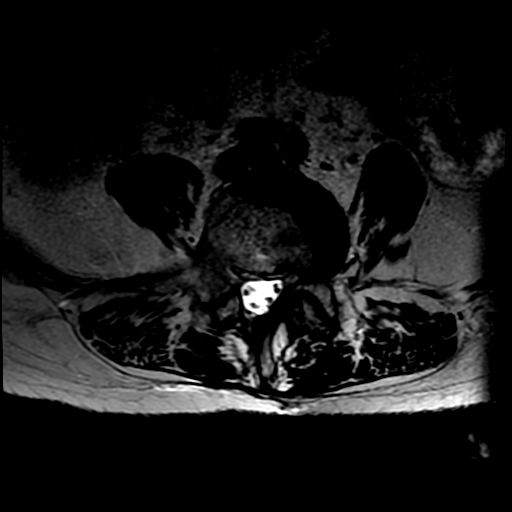
[im 16/40]
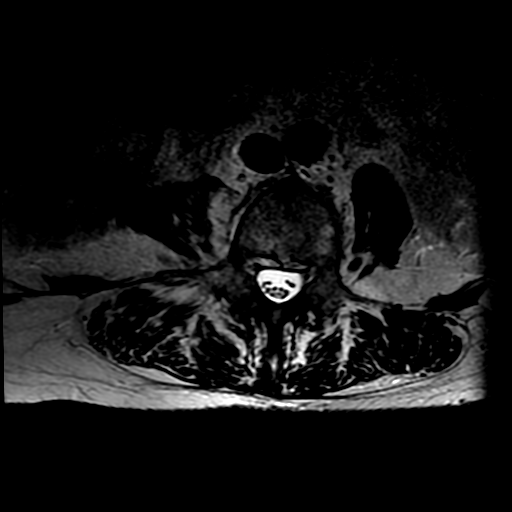
[im 20/40]
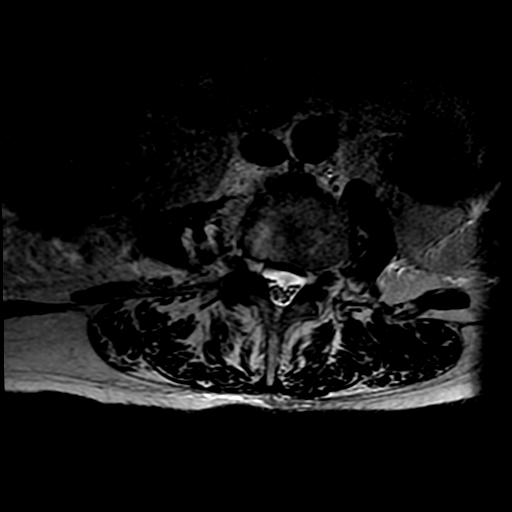
[im 24/40]
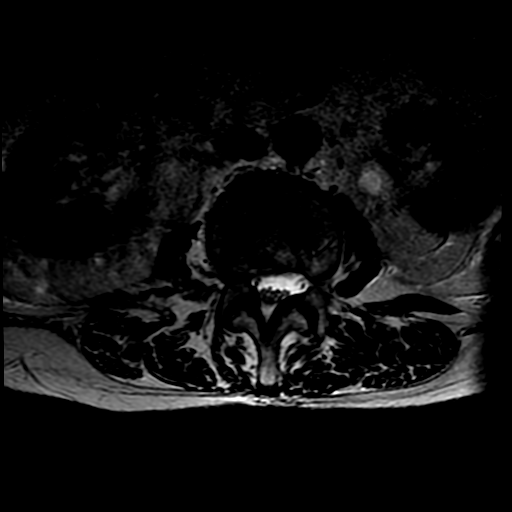
[im 28/40]
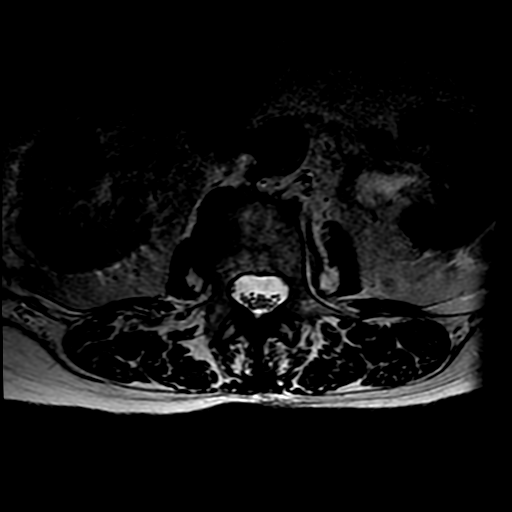
[im 36/40]
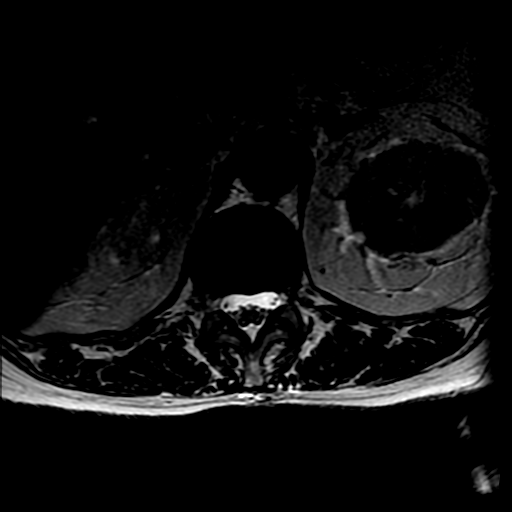

[Series 6: T1 · axial · 4.0mm · 0.39mm/px · z∈[-48,+112]mm · 3 of 40 slices shown (2 of 2)]
[im 4/40]
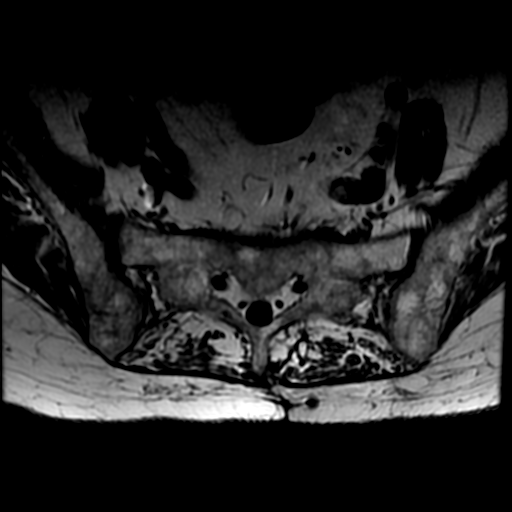
[im 20/40]
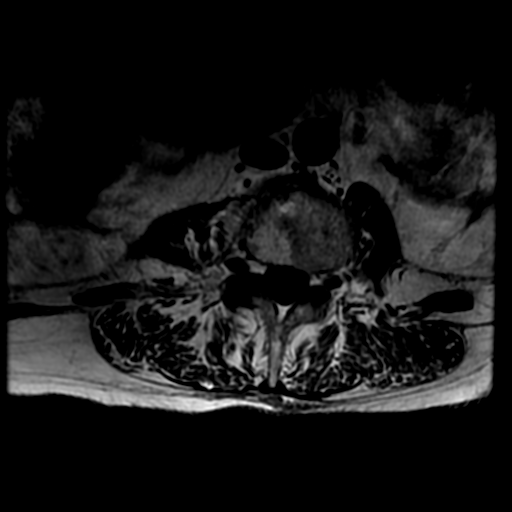
[im 36/40]
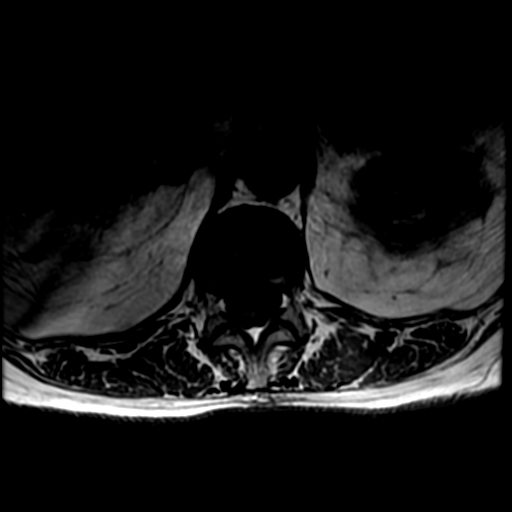

[18 of 48 positions shown; findings below may reference images not displayed]

FINDINGS: Segmentation:  5 lumbar type vertebral bodies.

Alignment: Curvature convex to the left with the apex at L3.
Straightening the normal cervical lordosis. 2 mm anterolisthesis
L4-5.

Vertebrae: No fracture or primary bone lesion. Chronic discogenic
marrow changes.

Conus medullaris: Extends to the L1 level and appears normal.

Paraspinal and other soft tissues: Negative except for renal cysts.

Disc levels:

No significant finding at T11-12 or T12-L1.

L1-2: Endplate osteophytes and bulging of the disc. Mild narrowing
of both lateral recesses without visible neural compression.

L2-3: Endplate osteophytes and bulging of the disc. Mild facet
hypertrophy on the right. Mild narrowing of the lateral recesses
right more than left. No definite neural compression.

L3-4: Endplate osteophytes and bulging of the disc. Mild facet and
ligamentous hypertrophy. Bilateral lateral recess stenosis that
could possibly cause neural compression.

L4-5: Previous right hemilaminectomy. 2 mm anterolisthesis because
of facet osteoarthritis. Endplate osteophytes and minimal bulging of
the disc. Mild stenosis of the left lateral recess.

L5-S1: Left posterolateral to foraminal disc herniation that could
compress the left L5 nerve root. Mild facet and ligamentous
hypertrophy with narrowing of the subarticular lateral recesses left
more than right.

No finding to suggest spinal or paraspinal infection.
IMPRESSION: No finding to suggest spinal or paraspinal infection.

Curvature convex to the left with the apex at L3. Straightening of
the normal lumbar lordosis. 2 mm anterolisthesis L4-5.

Degenerative disc disease and degenerative facet disease. Narrowing
of the lateral recesses could cause neural compression on the right
at L2-3, bilaterally at L3-4 and on the left at L4-5. At L5-S1,
there is a left foraminal disc herniation which would likely
compress the left L5 nerve root. Mild narrowing of the subarticular
lateral recess on the left.

## 2019-03-15 ENCOUNTER — Telehealth (HOSPITAL_COMMUNITY): Payer: Self-pay

## 2019-03-15 NOTE — Telephone Encounter (Signed)
Received email from RN from [facility] regarding verification of Uptravi dose. RN Webb Silversmith spoke with patient and patient states she is currently on uptravi bid.  Last note 3/20 discussed patient was on daily dose as she was on plavix.  Pt was supposed to be on plavix two additional months. Pt not seen in office since April.  Spoke with Dr. Aundra Dubin, as long as patient is not on plavix, she can resume uptravi bid.  Called patient and could not leave vm.  Will make another attempt tomorrow to discuss with patient.

## 2019-03-16 ENCOUNTER — Other Ambulatory Visit: Payer: Self-pay | Admitting: Cardiovascular Disease

## 2019-03-16 DIAGNOSIS — R0902 Hypoxemia: Secondary | ICD-10-CM | POA: Diagnosis not present

## 2019-03-16 NOTE — Telephone Encounter (Signed)
Dr. Johnsie Cancel prescribed, ok to give 59 tab with 4 refills with note to make an appt as she will be due for one year f/u at that time.

## 2019-03-16 NOTE — Telephone Encounter (Signed)
Pt's pharmacy requesting a refill on sildenafil. Would Dr. Johnsie Cancel like to refill this medication? Please address

## 2019-03-17 NOTE — Telephone Encounter (Signed)
Called patient again to discuss medication dose. Could not leave message.

## 2019-03-18 NOTE — Telephone Encounter (Signed)
Pt called back stating she is still on plavix and can only tolerate uptravi daily not bid.

## 2019-03-27 IMAGING — US IR US GUIDE VASC ACCESS RIGHT
1 series · 1 of 1 positions shown · non-contrast
Comparison: none

INDICATION: Osteomyelitis

[Series 1: ir fluoro/shunt/fist · 1 of 1 slices shown]
[im 1/1]
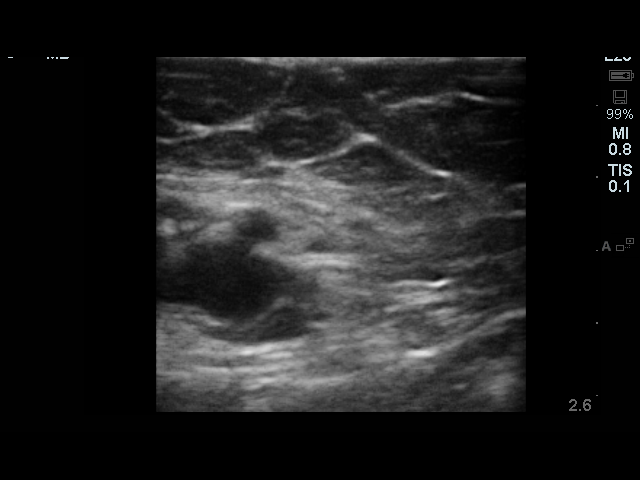

[1 of 1 positions shown; findings below may reference images not displayed]

EXAM:
RIGHT UPPER EXTREMITY PICC LINE PLACEMENT WITH ULTRASOUND AND
FLUOROSCOPIC GUIDANCE

MEDICATIONS:
None

ANESTHESIA/SEDATION:
None

FLUOROSCOPY TIME:  Fluoroscopy Time:  minutes 6 seconds (0.4 mGy).

COMPLICATIONS:
None immediate.

PROCEDURE:
The patient was advised of the possible risks and complications and
agreed to undergo the procedure. The patient was then brought to the
angiographic suite for the procedure.

The right arm was prepped with chlorhexidine, draped in the usual
sterile fashion using maximum barrier technique (cap and mask,
sterile gown, sterile gloves, large sterile sheet, hand hygiene and
cutaneous antiseptic). Local anesthesia was attained by infiltration
with 1% lidocaine.

Ultrasound demonstrated patency of the basilic vein, and this was
documented with an image. Under real-time ultrasound guidance, this
vein was accessed with a 21 gauge micropuncture needle and image
documentation was performed. The needle was exchanged over a
guidewire for a peel-away sheath through which a 39 cm 5 French
single lumen power injectable PICC was advanced, and positioned with
its tip at the lower SVC/right atrial junction. Fluoroscopy during
the procedure and fluoro spot radiograph confirms appropriate
catheter position. The catheter was flushed, secured to the skin
with Prolene sutures, and covered with a sterile dressing.
IMPRESSION: Successful placement of a right arm PICC with sonographic and
fluoroscopic guidance. The catheter is ready for use.

## 2019-03-29 ENCOUNTER — Other Ambulatory Visit (HOSPITAL_COMMUNITY): Payer: Self-pay

## 2019-03-29 MED ORDER — SELEXIPAG 200 MCG PO TABS: 200.0000 ug | ORAL_TABLET | Freq: Every day | ORAL | 11 refills | Status: DC

## 2019-04-13 DIAGNOSIS — H353132 Nonexudative age-related macular degeneration, bilateral, intermediate dry stage: Secondary | ICD-10-CM | POA: Diagnosis not present

## 2019-04-13 DIAGNOSIS — H26492 Other secondary cataract, left eye: Secondary | ICD-10-CM | POA: Diagnosis not present

## 2019-04-13 DIAGNOSIS — H43823 Vitreomacular adhesion, bilateral: Secondary | ICD-10-CM | POA: Diagnosis not present

## 2019-04-13 DIAGNOSIS — Z961 Presence of intraocular lens: Secondary | ICD-10-CM | POA: Diagnosis not present

## 2019-04-13 DIAGNOSIS — H18413 Arcus senilis, bilateral: Secondary | ICD-10-CM | POA: Diagnosis not present

## 2019-04-16 DIAGNOSIS — R0902 Hypoxemia: Secondary | ICD-10-CM | POA: Diagnosis not present

## 2019-04-20 DIAGNOSIS — G8929 Other chronic pain: Secondary | ICD-10-CM | POA: Diagnosis not present

## 2019-04-20 DIAGNOSIS — M545 Low back pain: Secondary | ICD-10-CM | POA: Diagnosis not present

## 2019-04-20 DIAGNOSIS — E119 Type 2 diabetes mellitus without complications: Secondary | ICD-10-CM | POA: Diagnosis not present

## 2019-04-20 DIAGNOSIS — Z23 Encounter for immunization: Secondary | ICD-10-CM | POA: Diagnosis not present

## 2019-04-20 DIAGNOSIS — M1990 Unspecified osteoarthritis, unspecified site: Secondary | ICD-10-CM | POA: Diagnosis not present

## 2019-04-25 ENCOUNTER — Other Ambulatory Visit (HOSPITAL_COMMUNITY): Payer: Self-pay | Admitting: Cardiology

## 2019-04-27 DIAGNOSIS — H26491 Other secondary cataract, right eye: Secondary | ICD-10-CM | POA: Diagnosis not present

## 2019-05-10 DIAGNOSIS — H26491 Other secondary cataract, right eye: Secondary | ICD-10-CM | POA: Diagnosis not present

## 2019-05-16 DIAGNOSIS — R0902 Hypoxemia: Secondary | ICD-10-CM | POA: Diagnosis not present

## 2019-05-17 ENCOUNTER — Other Ambulatory Visit (HOSPITAL_COMMUNITY): Payer: Self-pay

## 2019-05-17 MED ORDER — MACITENTAN 10 MG PO TABS: 10.0000 mg | ORAL_TABLET | Freq: Every day | ORAL | 1 refills | Status: DC

## 2019-05-21 ENCOUNTER — Telehealth (HOSPITAL_COMMUNITY): Payer: Self-pay

## 2019-05-21 NOTE — Telephone Encounter (Signed)
Patient called to let you know that she has been experiencing some nosebleeds. She reports that they happen once a week and only last about 10-98mn. She states when they happen shes not doing anything in particular. Please advise on what patient should no next

## 2019-05-21 NOTE — Telephone Encounter (Signed)
Use saline nasal spray to keep nasal passages moist.

## 2019-05-24 NOTE — Telephone Encounter (Signed)
Patient advised and verbalized understanding

## 2019-06-08 ENCOUNTER — Other Ambulatory Visit: Payer: Self-pay | Admitting: Physician Assistant

## 2019-06-16 ENCOUNTER — Other Ambulatory Visit: Payer: Self-pay | Admitting: Cardiovascular Disease

## 2019-06-16 ENCOUNTER — Other Ambulatory Visit: Payer: Self-pay

## 2019-06-16 ENCOUNTER — Ambulatory Visit (HOSPITAL_COMMUNITY)
Admission: RE | Admit: 2019-06-16 | Discharge: 2019-06-16 | Disposition: A | Discharge status: Home / Self Care | Payer: Medicare Other | Source: Ambulatory Visit | Attending: Cardiology | Admitting: Cardiology

## 2019-06-16 DIAGNOSIS — I25119 Atherosclerotic heart disease of native coronary artery with unspecified angina pectoris: Secondary | ICD-10-CM

## 2019-06-16 DIAGNOSIS — I272 Pulmonary hypertension, unspecified: Secondary | ICD-10-CM

## 2019-06-16 DIAGNOSIS — E785 Hyperlipidemia, unspecified: Secondary | ICD-10-CM

## 2019-06-16 DIAGNOSIS — R0902 Hypoxemia: Secondary | ICD-10-CM | POA: Diagnosis not present

## 2019-06-16 MED ORDER — ATORVASTATIN CALCIUM 80 MG PO TABS: 80.0000 mg | ORAL_TABLET | Freq: Every day | ORAL | 0 refills | Status: DC

## 2019-06-16 NOTE — Patient Instructions (Signed)
Dr Aundra Dubin has recommended you change your Sildenafil to [place], our pharmacy team will contact you to help with this change  Your physician recommends that you return for a FASTING lab work, our office will call you to schedule this  Your physician recommends that you schedule a follow-up appointment in: 3 months, our office will call you to schedule this  If you have any questions or concerns before your next appointment please send [place] a message through [place] or call our office at [phone].  At the [facility], you and your health needs are our priority. As part of our continuing mission to provide you with exceptional heart care, we have created designated Provider Care Teams. These Care Teams include your primary Cardiologist (physician) and Advanced Practice Providers (APPs- Physician Assistants and Nurse Practitioners) who all work together to provide you with the care you need, when you need it.   You may see any of the following providers on your designated Care Team at your next follow up: Marland Kitchen Dr Glori Bickers . Dr Loralie Champagne . Darrick Grinder, NP . Lyda Jester, PA   Please be sure to bring in all your medications bottles to every appointment.

## 2019-06-16 NOTE — Progress Notes (Signed)
Per Dr Aundra Dubin:  Larey Dresser, MD  Scarlette Calico, RN        1. Come to office for lipids, BMET, CBC  2. Work on transitioning from Sildenafil to Adempas for pulmonary hypertension.  3. Followup 3 months.    Spoke w/pt via phone, advised her pharmacy team will contact her about getting started on Adempas and schedulers will contact her to schedule lab and f/u appt.

## 2019-06-17 ENCOUNTER — Telehealth (HOSPITAL_COMMUNITY): Payer: Self-pay | Admitting: Pharmacist

## 2019-06-17 NOTE — Progress Notes (Signed)
Heart Failure TeleHealth Note  Due to national recommendations of social distancing due to [place] 19, Audio/video telehealth visit is felt to be most appropriate for this patient at this time.  See MyChart message from today for patient consent regarding telehealth for [facility].  Date:  06/17/2019   ID:  [name], DOB 05-27-1932, MRN [id number]  Location: Home  Provider location: [place] [facility] Type of Visit: Established patient   PCP:  Joy Bender, PA-C  Cardiologist:  Joy Rouge, MD HF Cardiology: [name]  Chief Complaint: shortness of breath   History of Present Illness: [name] presents via audio/video conferencing for a telehealth visit today.     She denies symptoms worrisome for COVID 69.   85 y.o. with history of DM, HTN, and prior osteomyelitis of jaw was referred by [name] for evaluation of pulmonary hypertension/RV failure.  She reports an episode of PNA in 3/19 and says that she never recovered from this.  She had gradually progressive exertional dyspnea to the point where she was short of breath walking around the house.  She had an echo in 7/19 that showed mildly decreased LV systolic function but severely decreased RV systolic function with dilated RV and at least moderate pulmonary hypertension by echo estimation.  She saw her PCP in 8/19 and was noted to be hypoxemic at rest.  He started her on home oxygen 2 L/[place] which she continues.    She was referred to cardiology, saw [name]. Amlodipine was stopped and she was started empirically on sildenafil 20 mg tid for pulmonary hypertension.   She was referred to CHF clinic.    RHC/LHC was done in 10/19, showing complex severe bifurcation lesion involving the proximal LAD and large D2 ostium. She had complex PCI requiring atherectomy and DES to pLAD and ostial D2. RHC showed PAH with PVR 11.6 WU and CI 1.7.  She subsequently was found to have a right groin pseudoaneurysm from  cath.   She was started on Opsumit.  Serologies have suggested CREST syndrome.  However, she saw [name] for rheumatology who told her that he did not think that she had a rheumatologic diagnosis (per her report).   Cardiolite was abnormal in 3/20, this was followed by [facility] in 3/20 that showed nonobstructive CAD.  Echo in 3/20 showed EF 55-60%, with moderately decreased RV systolic function, PASP 64 mmHg.   She returns today for followup of pulmonary hypertension and CAD.  She continues to wear home oxygen 4L [place].  She is on selexipag 200 mcg daily, unable to tolerate increased dose.  She continues on sildenafil and Opsumit.  She overall has felt better on PH meds.  She is doing houswork with no problems. She gets short of breath if she "rushes."  No chest pain.  No lightheadedness.  No orthopnea/PND.    ECG (3/20, personally reviewed): NSR, RBBB  6 minute walk (12/19): 79 m 6 minute walk (1/20): 121 m  Labs (12/19): K 3.9, creatinine 0.78 Labs (3/20): K 4, creatinine 1.1, hgb 10.2  PMH: 1. H/o osteomyelitis of jaw.  2. Duodenal ulcers with upper GI bleeding. 3. Type 2 diabetes 4. Hyperlipidemia 5. Squamous cell carcinoma of scalp with radiation and surgery.  6. GERD 7. H/o back surgery  8. HTN 9. Pulmonary hypertension/RV failure: Echo (7/19) with EF 45-50%, moderate focal basal septal hypertrophy, mild diffuse hypokinesis, septal flattening, severe RV dilation with severely decreased systolic function, moderate TR, PASP 61  mmHg.   - CT chest (7/19): Mild emphysema, enlarged PA.  - V/Q scan (8/19): No evidence for chronic PE.  - anti-centromere Ab positive, ANA positive, RF 73 => suspect CREST syndrome.  - RHC (10/19): mean RA 7, PA 64/21 mean 38, mean PCWP 8, CI 1.7, PVR 11.6 WU.  - Echo (3/20): EF 55-60%, moderately decreased RV systolic function, PASP 64 mmHg 10. CAD: LHC (10/19) with 95% proximal LAD and 95% ostial D2.  Complicated bifurcation intervention with atherectomy and  DES to proximal LAD and ostial D2.   - LHC (3/20): 40% mLAD, 30% mRCA.  11. Post-cath right groin pseudoaneurysm in 10/19.   SH: Lives in [place] alone, daughter lives very close.  Quit smoking > 20 years ago. No ETOH.   FH: No history of pulmonary hypertension.  Mother with "heart problems."  Sister with PPM.    Review of systems complete and found to be negative unless listed in HPI.   Current Outpatient Medications  Medication Sig Dispense Refill   ALPRAZolam (XANAX) 0.25 MG tablet Take 0.25 mg by mouth daily as needed for anxiety.      aspirin EC 81 MG tablet Take 1 tablet (81 mg total) by mouth daily. 30 tablet 11   atorvastatin (LIPITOR) 80 MG tablet Take 1 tablet (80 mg total) by mouth daily. Please make overdue appt with [name] before anymore refills. 1st attempt 30 tablet 0   carvedilol (COREG) 12.5 MG tablet Take 1 tablet (12.5 mg total) by mouth 2 (two) times daily with a meal. 60 tablet 0   carvedilol (COREG) 3.125 MG tablet TAKE 1 TABLET BY MOUTH TWICE DAILY WITH A MEAL 60 tablet 6   chlorhexidine (PERIDEX) 0.12 % solution 15 mLs by Mouth Rinse route 2 (two) times daily as needed (sore gums).   4   clopidogrel (PLAVIX) 75 MG tablet TAKE 1 TABLET BY MOUTH DAILY 30 tablet 0   cyclobenzaprine (FLEXERIL) 10 MG tablet Take 1 tablet (10 mg total) by mouth 3 (three) times daily as needed for muscle spasms. (Patient taking differently: Take 10 mg by mouth 2 (two) times daily. ) 30 tablet 0   furosemide (LASIX) 40 MG tablet TAKE 1 TABLET(40 MG) BY MOUTH DAILY 30 tablet 11   gabapentin (NEURONTIN) 300 MG capsule Take 300 mg by mouth at bedtime.      lactose free nutrition (BOOST) LIQD Take 237 mLs by mouth 2 (two) times daily between meals.      macitentan (OPSUMIT) 10 MG tablet Take 1 tablet (10 mg total) by mouth daily. Will need appointment with [name] for further refills 30 tablet 1   mirtazapine (REMERON) 30 MG tablet Take 30 mg by mouth at bedtime.      pantoprazole (PROTONIX) 40 MG tablet Take 1 tablet (40 mg total) by mouth 2 (two) times daily. (Patient taking differently: Take 40 mg by mouth daily. ) 120 tablet 0   potassium chloride SA (K-DUR) 20 MEQ tablet TAKE 1 TABLET(20 MEQ) BY MOUTH DAILY 30 tablet 6   Selexipag 200 MCG TABS Take 1 tablet (200 mcg total) by mouth daily. 30 tablet 11   sildenafil (REVATIO) 20 MG tablet Take 1 tablet (20 mg total) by mouth 3 (three) times daily. Please make yearly appt with [name] for future refills. 1st attempt 90 tablet 4   traMADol-acetaminophen (ULTRACET) 37.5-325 MG tablet Take 2 tablets by mouth 4 (four) times daily as needed for pain.  2   No current facility-administered medications  for this encounter.    There were no vitals taken for this visit.  Wt Readings from Last 3 Encounters:  11/04/18 64.9 kg (143 lb)  10/10/18 64.5 kg (142 lb 3.2 oz)  08/27/18 65.6 kg (144 lb 9.6 oz)   (Video/Tele Health Call; Exam is subjective and or/visual.) General:  Speaks in full sentences. No resp difficulty. Lungs: Normal respiratory effort with conversation.  Abdomen: Non-distended per patient report Extremities: Pt denies edema. Neuro: Alert & oriented x 3.   Assessment/Plan: 1. CAD: Coronary angiography showed complex bifurcation lesion with 95% stenosis proximal LAD with calcification at take-off of large D2. The ostial/proximal D2 also had 95% calcified stenosis. Suspect this lesion played a role in her exertional dyspnea and also in her mildly decreased LV systolic function (EF 09-29% on echo in 7/19).  S/p successful atherectomy with bifurcation stenting of proximal LAD and D2 05/15/18.  Repeat LHC in 3/20 showed nonobstructive disease.   - Continue ASA 81.  - I will have her continue Plavix for now given complex intervention.  - Continue atorvastatin, check lipids today.  2.Pulmonary hypertension: She appearedto have at least moderate pulmonary hypertension by echo in 7/19 with RV failure.  CT chest showed mild emphysema, which should not explain her degree of RV failure. V/Q scan did not show evidence for chronic PEs.Anti-centromere antibody, ANA, and RF were all positive. ?CREST variant of scleroderma.She remains quite symptomatic and is on home oxygen.[place] 05/15/18 showed moderate PAH but very high PVR and low cardiac output. This is concerning for advanced pulmonary hypertension.   - I will try to transition her from sildenafil to riociguat as I expect that this may be more effective.  - Continue Opsumit 10 mg daily.  - She can only tolerate Selexipag at 200 mcg daily but thinks that even the low dose has helped.  - Refuses sleep study.  - Continue home oxygen.  3. Chronic diastolic CHF/RV failure:  NYHA III.  She is not volume overloaded on exam.  - Continue Lasix 40 mg daily + KCl 20 daily.  BMET today.  4. ?CREST syndrome: Per patient, [name] did not think that she had CREST.    COVID screen The patient does not have any symptoms that suggest any further testing/ screening at this time.  Social distancing reinforced today.  Patient Risk: After full review of this patients clinical status, I feel that they are at moderate risk for cardiac decompensation at this time.  Relevant cardiac medications were reviewed at length with the patient today. The patient does not have concerns regarding their medications at this time.   Recommended follow-up:  3 months in office with 6 minute walk.   Today, I have spent 18 minutes with the patient with telehealth technology discussing the above issues .     Loralie Champagne 06/17/2019

## 2019-06-17 NOTE — Telephone Encounter (Addendum)
Advanced Heart Failure Patient Advocate Encounter  Prior Authorization for Adempas has been approved.    PA# [id number]  Patients co-pay is $0.00 with grant from [employer] (see below)  Received message that Dr. Aundra Dubin would like to stop sildenafil and start Adempas for [name]. Application for Adempas enrollment pending patient and provider signature. Reached out to patient informing her that she will need to sign the application. She would like the application mailed to her. Will fax in when all information received.  Received grant from [employer]: ID: [id number] Group: [place]: [id number] PCN: [id number] Effective through 08/05/2019. Able to re-enroll starting 07/07/19.  Audry Riles, PharmD, BCPS, CPP Heart Failure Clinic Pharmacist [phone]

## 2019-06-23 ENCOUNTER — Other Ambulatory Visit (HOSPITAL_COMMUNITY): Payer: Self-pay | Admitting: Cardiology

## 2019-06-28 ENCOUNTER — Other Ambulatory Visit (HOSPITAL_COMMUNITY): Payer: Medicare Other

## 2019-06-29 NOTE — Telephone Encounter (Signed)
Received Ms. Steedman's signed Adempas REMS and Patient Enrollment form today. Sent in application to [employer].   Phone: [phone] Fax: [phone]  Audry Riles, PharmD, BCPS, BCCP, CPP Heart Failure Clinic Pharmacist [phone]

## 2019-07-05 MED ORDER — ADEMPAS 0.5 MG PO TABS: 0.5000 mg | ORAL_TABLET | Freq: Three times a day (TID) | ORAL | 11 refills | Status: DC

## 2019-07-05 NOTE — Telephone Encounter (Signed)
Received notification from the [place] that patient's enrollment form and REMS form have been completed and processed for Adempas. She will be able to start therapy.   Prescription was sent to [facility] and they will ship her the medication today. She is aware that she will have to stop sildenafil 24 hours prior to beginning Adempas.   Medication list updated to reflect medication changes. Sildenafil prescription discontinued at [facility].   Audry Riles, PharmD, BCPS, BCCP, CPP Heart Failure Clinic Pharmacist [phone]'

## 2019-07-06 ENCOUNTER — Other Ambulatory Visit (HOSPITAL_COMMUNITY): Payer: Medicare Other

## 2019-07-09 DIAGNOSIS — I270 Primary pulmonary hypertension: Secondary | ICD-10-CM | POA: Diagnosis not present

## 2019-07-12 ENCOUNTER — Other Ambulatory Visit (HOSPITAL_COMMUNITY): Payer: Self-pay

## 2019-07-12 ENCOUNTER — Other Ambulatory Visit: Payer: Self-pay | Admitting: Cardiovascular Disease

## 2019-07-12 MED ORDER — ATORVASTATIN CALCIUM 80 MG PO TABS: 80.0000 mg | ORAL_TABLET | Freq: Every day | ORAL | 0 refills | Status: DC

## 2019-07-12 MED ORDER — POTASSIUM CHLORIDE CRYS ER 20 MEQ PO TBCR: EXTENDED_RELEASE_TABLET | ORAL | 6 refills | Status: DC

## 2019-07-12 MED ORDER — CLOPIDOGREL BISULFATE 75 MG PO TABS: 75.0000 mg | ORAL_TABLET | Freq: Every day | ORAL | 0 refills | Status: DC

## 2019-07-12 NOTE — Addendum Note (Signed)
Addended by: Derl Barrow on: 07/12/2019 10:29 AM   Modules accepted: Orders

## 2019-07-16 ENCOUNTER — Other Ambulatory Visit (HOSPITAL_COMMUNITY): Payer: Self-pay | Admitting: *Deleted

## 2019-07-16 DIAGNOSIS — R0902 Hypoxemia: Secondary | ICD-10-CM | POA: Diagnosis not present

## 2019-07-16 MED ORDER — CLOPIDOGREL BISULFATE 75 MG PO TABS: 75.0000 mg | ORAL_TABLET | Freq: Every day | ORAL | 3 refills | Status: DC

## 2019-07-16 MED ORDER — ATORVASTATIN CALCIUM 80 MG PO TABS: 80.0000 mg | ORAL_TABLET | Freq: Every day | ORAL | 0 refills | Status: DC

## 2019-07-16 MED ORDER — ATORVASTATIN CALCIUM 80 MG PO TABS: 80.0000 mg | ORAL_TABLET | Freq: Every day | ORAL | 3 refills | Status: DC

## 2019-07-19 ENCOUNTER — Other Ambulatory Visit (HOSPITAL_COMMUNITY): Payer: Self-pay

## 2019-07-19 MED ORDER — ATORVASTATIN CALCIUM 80 MG PO TABS: 80.0000 mg | ORAL_TABLET | Freq: Every day | ORAL | 3 refills | Status: DC

## 2019-07-19 MED ORDER — CLOPIDOGREL BISULFATE 75 MG PO TABS: 75.0000 mg | ORAL_TABLET | Freq: Every day | ORAL | 3 refills | Status: DC

## 2019-07-20 ENCOUNTER — Telehealth (HOSPITAL_COMMUNITY): Payer: Self-pay | Admitting: Pharmacist

## 2019-07-20 NOTE — Telephone Encounter (Signed)
Advanced Heart Failure Patient Advocate Encounter  Prior Authorization for Uptravi and Opsumit have been approved.    PA# [id number], [id number] Effective dates: 07/20/19 through 08/04/2020  Audry Riles, PharmD, BCPS, BCCP, CPP Heart Failure Clinic Pharmacist [phone]

## 2019-07-20 NOTE — Telephone Encounter (Signed)
Patient Advocate Encounter   Received notification from [employer] that prior authorization for Uptravi and Opsumit are required.   Malvin Johns: PA submitted on CoverMyMeds Key [id number] Status is pending  Opsumit: PA submitted on CoverMyMeds Key [id number] Status is pending   Will continue to follow.  Audry Riles, PharmD, BCPS, BCCP, CPP Heart Failure Clinic Pharmacist [phone]

## 2019-08-02 ENCOUNTER — Ambulatory Visit (HOSPITAL_COMMUNITY)
Admission: RE | Admit: 2019-08-02 | Discharge: 2019-08-02 | Disposition: A | Discharge status: Home / Self Care | Payer: Medicare Other | Source: Ambulatory Visit | Attending: Cardiology | Admitting: Cardiology

## 2019-08-02 ENCOUNTER — Other Ambulatory Visit: Payer: Self-pay

## 2019-08-02 DIAGNOSIS — I272 Pulmonary hypertension, unspecified: Secondary | ICD-10-CM | POA: Diagnosis not present

## 2019-08-02 DIAGNOSIS — I25119 Atherosclerotic heart disease of native coronary artery with unspecified angina pectoris: Secondary | ICD-10-CM | POA: Insufficient documentation

## 2019-08-02 DIAGNOSIS — E785 Hyperlipidemia, unspecified: Secondary | ICD-10-CM | POA: Insufficient documentation

## 2019-08-02 LAB — CBC
HCT: 35.9 % — ABNORMAL LOW (ref 36.0–46.0)
Hemoglobin: 10.9 g/dL — ABNORMAL LOW (ref 12.0–15.0)
MCH: 26.5 pg (ref 26.0–34.0)
MCHC: 30.4 g/dL (ref 30.0–36.0)
MCV: 87.3 fL (ref 80.0–100.0)
Platelets: 258 10*3/uL (ref 150–400)
RBC: 4.11 MIL/uL (ref 3.87–5.11)
RDW: 16 % — ABNORMAL HIGH (ref 11.5–15.5)
WBC: 8.6 10*3/uL (ref 4.0–10.5)
nRBC: 0 % (ref 0.0–0.2)

## 2019-08-02 LAB — BASIC METABOLIC PANEL
Anion gap: 10 (ref 5–15)
BUN: 25 mg/dL — ABNORMAL HIGH (ref 8–23)
CO2: 27 mmol/L (ref 22–32)
Calcium: 10.2 mg/dL (ref 8.9–10.3)
Chloride: 102 mmol/L (ref 98–111)
Creatinine, Ser: 1.04 mg/dL — ABNORMAL HIGH (ref 0.44–1.00)
GFR calc Af Amer: 57 mL/min — ABNORMAL LOW (ref >60)
GFR calc non Af Amer: 49 mL/min — ABNORMAL LOW (ref >60)
Glucose, Bld: 160 mg/dL — ABNORMAL HIGH (ref 70–99)
Potassium: 4.4 mmol/L (ref 3.5–5.1)
Sodium: 139 mmol/L (ref 135–145)

## 2019-08-02 LAB — LIPID PANEL
Cholesterol: 145 mg/dL (ref 0–200)
HDL: 32 mg/dL — ABNORMAL LOW (ref >40)
LDL Cholesterol: 67 mg/dL (ref 0–99)
Total CHOL/HDL Ratio: 4.5 RATIO
Triglycerides: 228 mg/dL — ABNORMAL HIGH (ref <150)
VLDL: 46 mg/dL — ABNORMAL HIGH (ref 0–40)

## 2019-08-04 DIAGNOSIS — G8929 Other chronic pain: Secondary | ICD-10-CM | POA: Diagnosis not present

## 2019-08-04 DIAGNOSIS — E119 Type 2 diabetes mellitus without complications: Secondary | ICD-10-CM | POA: Diagnosis not present

## 2019-08-04 DIAGNOSIS — M545 Low back pain: Secondary | ICD-10-CM | POA: Diagnosis not present

## 2019-08-04 DIAGNOSIS — M1990 Unspecified osteoarthritis, unspecified site: Secondary | ICD-10-CM | POA: Diagnosis not present

## 2019-08-04 DIAGNOSIS — Z79899 Other long term (current) drug therapy: Secondary | ICD-10-CM | POA: Diagnosis not present

## 2019-08-16 DIAGNOSIS — R0902 Hypoxemia: Secondary | ICD-10-CM | POA: Diagnosis not present

## 2019-09-16 DIAGNOSIS — R0902 Hypoxemia: Secondary | ICD-10-CM | POA: Diagnosis not present

## 2019-09-17 ENCOUNTER — Encounter (HOSPITAL_COMMUNITY): Payer: Medicare Other | Admitting: Cardiology

## 2019-09-24 ENCOUNTER — Other Ambulatory Visit: Payer: Self-pay

## 2019-09-24 ENCOUNTER — Ambulatory Visit (HOSPITAL_COMMUNITY)
Admission: RE | Admit: 2019-09-24 | Discharge: 2019-09-24 | Disposition: A | Discharge status: Home / Self Care | Payer: Medicare Other | Source: Ambulatory Visit | Attending: Cardiology | Admitting: Cardiology

## 2019-09-24 ENCOUNTER — Encounter (HOSPITAL_COMMUNITY): Payer: Self-pay | Admitting: Cardiology

## 2019-09-24 VITALS — BP 130/82 | HR 88 | Wt 150.8 lb

## 2019-09-24 DIAGNOSIS — Z9981 Dependence on supplemental oxygen: Secondary | ICD-10-CM | POA: Insufficient documentation

## 2019-09-24 DIAGNOSIS — Z7902 Long term (current) use of antithrombotics/antiplatelets: Secondary | ICD-10-CM | POA: Insufficient documentation

## 2019-09-24 DIAGNOSIS — J439 Emphysema, unspecified: Secondary | ICD-10-CM | POA: Diagnosis not present

## 2019-09-24 DIAGNOSIS — Z87891 Personal history of nicotine dependence: Secondary | ICD-10-CM | POA: Insufficient documentation

## 2019-09-24 DIAGNOSIS — Z79899 Other long term (current) drug therapy: Secondary | ICD-10-CM | POA: Insufficient documentation

## 2019-09-24 DIAGNOSIS — I2510 Atherosclerotic heart disease of native coronary artery without angina pectoris: Secondary | ICD-10-CM | POA: Insufficient documentation

## 2019-09-24 DIAGNOSIS — E785 Hyperlipidemia, unspecified: Secondary | ICD-10-CM | POA: Insufficient documentation

## 2019-09-24 DIAGNOSIS — E119 Type 2 diabetes mellitus without complications: Secondary | ICD-10-CM | POA: Diagnosis not present

## 2019-09-24 DIAGNOSIS — I4510 Unspecified right bundle-branch block: Secondary | ICD-10-CM | POA: Diagnosis not present

## 2019-09-24 DIAGNOSIS — Z7982 Long term (current) use of aspirin: Secondary | ICD-10-CM | POA: Insufficient documentation

## 2019-09-24 DIAGNOSIS — K219 Gastro-esophageal reflux disease without esophagitis: Secondary | ICD-10-CM | POA: Diagnosis not present

## 2019-09-24 DIAGNOSIS — I110 Hypertensive heart disease with heart failure: Secondary | ICD-10-CM | POA: Diagnosis not present

## 2019-09-24 DIAGNOSIS — I5032 Chronic diastolic (congestive) heart failure: Secondary | ICD-10-CM | POA: Insufficient documentation

## 2019-09-24 DIAGNOSIS — Z955 Presence of coronary angioplasty implant and graft: Secondary | ICD-10-CM | POA: Insufficient documentation

## 2019-09-24 DIAGNOSIS — Z85828 Personal history of other malignant neoplasm of skin: Secondary | ICD-10-CM | POA: Diagnosis not present

## 2019-09-24 DIAGNOSIS — I272 Pulmonary hypertension, unspecified: Secondary | ICD-10-CM | POA: Diagnosis not present

## 2019-09-24 DIAGNOSIS — I50810 Right heart failure, unspecified: Secondary | ICD-10-CM

## 2019-09-24 LAB — BASIC METABOLIC PANEL
Anion gap: 11 (ref 5–15)
BUN: 25 mg/dL — ABNORMAL HIGH (ref 8–23)
CO2: 27 mmol/L (ref 22–32)
Calcium: 9 mg/dL (ref 8.9–10.3)
Chloride: 99 mmol/L (ref 98–111)
Creatinine, Ser: 0.94 mg/dL (ref 0.44–1.00)
GFR calc Af Amer: >60 mL/min (ref >60)
GFR calc non Af Amer: 55 mL/min — ABNORMAL LOW (ref >60)
Glucose, Bld: 197 mg/dL — ABNORMAL HIGH (ref 70–99)
Potassium: 4 mmol/L (ref 3.5–5.1)
Sodium: 137 mmol/L (ref 135–145)

## 2019-09-24 LAB — BRAIN NATRIURETIC PEPTIDE: B Natriuretic Peptide: 180.8 pg/mL — ABNORMAL HIGH (ref 0.0–100.0)

## 2019-09-24 NOTE — Progress Notes (Signed)
6 minute walk: pt ambulated 91 meters. Pt reported fatigue. Utilized O2- 3L.  O2 89-93% HR range 76-104. MD made aware of results.

## 2019-09-24 NOTE — Patient Instructions (Signed)
Continue to increase Adempas as indicated by the nurse.   Labs today We will only contact you if something comes back abnormal or we need to make some changes. Otherwise no news is good news!   Your physician has requested that you have an echocardiogram. Echocardiography is a painless test that uses sound waves to create images of your heart. It provides your doctor with information about the size and shape of your heart and how well your heart's chambers and valves are working. This procedure takes approximately one hour. There are no restrictions for this procedure.   Your physician recommends that you schedule a follow-up appointment in: 3 months with Dr Aundra Dubin   At the [facility], you and your health needs are our priority. As part of our continuing mission to provide you with exceptional heart care, we have created designated Provider Care Teams. These Care Teams include your primary Cardiologist (physician) and Advanced Practice Providers (APPs- Physician Assistants and Nurse Practitioners) who all work together to provide you with the care you need, when you need it.   You may see any of the following providers on your designated Care Team at your next follow up: Marland Kitchen Dr Glori Bickers . Dr Loralie Champagne . Darrick Grinder, NP . Lyda Jester, PA . Audry Riles, PharmD   Please be sure to bring in all your medications bottles to every appointment.

## 2019-09-26 NOTE — Progress Notes (Signed)
Date:  06/17/2019   ID:  [name], DOB 05-Sep-1931, MRN [id number]   Provider location: [place] [facility] Type of Visit: Established patient   PCP:  [name], PA-C  Cardiologist:  Jenkins Rouge, MD HF Cardiology: Dr. Aundra Dubin   History of Present Illness: [name] is an 86 y.o. with history of DM, HTN, and prior osteomyelitis of jaw referred by Dr. Johnsie Cancel for evaluation of pulmonary hypertension/RV failure.  She reports an episode of PNA in 3/19 and says that she never recovered from this.  She had gradually progressive exertional dyspnea to the point where she was short of breath walking around the house.  She had an echo in 7/19 that showed mildly decreased LV systolic function but severely decreased RV systolic function with dilated RV and at least moderate pulmonary hypertension by echo estimation.  She saw her PCP in 8/19 and was noted to be hypoxemic at rest.  He started her on home oxygen 2 L/[place] which she continues.    She was referred to cardiology, saw Dr. Johnsie Cancel. Amlodipine was stopped and she was started empirically on sildenafil 20 mg tid for pulmonary hypertension.   She was referred to CHF clinic.    RHC/LHC was done in 10/19, showing complex severe bifurcation lesion involving the proximal LAD and large D2 ostium. She had complex PCI requiring atherectomy and DES to pLAD and ostial D2. RHC showed PAH with PVR 11.6 WU and CI 1.7.  She subsequently was found to have a right groin pseudoaneurysm from cath.   She was started on Opsumit.  Serologies have suggested CREST syndrome.  However, she saw Dr. Amil Amen for rheumatology who told her that he did not think that she had a rheumatologic diagnosis (per her report).   Cardiolite was abnormal in 3/20, this was followed by [facility] in 3/20 that showed nonobstructive CAD.  Echo in 3/20 showed EF 55-60%, with moderately decreased RV systolic function, PASP 64 mmHg.   She returns today for followup of  pulmonary hypertension and CAD.  She continues to wear home oxygen 4L [place].  She is on selexipag 200 mcg daily, unable to tolerate increased dose.  She continues on Opsumit and is in the process of titrating up riociguat. She is short of breath with moderate exertion, generally does ok in the house.  She feels like her breathing is stable to improved, though 6 minute walk is still very poor.  No chest pain, no lightheadedness/syncope.   ECG (personally reviewed): NSR, RBBB (personally reviewed)  6 minute walk (12/19): 79 m 6 minute walk (1/20): 121 m 6 minute walk (2/21): 91 m  Labs (12/19): K 3.9, creatinine 0.78 Labs (3/20): K 4, creatinine 1.1, hgb 10.2 Labs (12/20): LDL 67, TGs 28, K 4.4, creatinine 1.04  PMH: 1. H/o osteomyelitis of jaw.  2. Duodenal ulcers with upper GI bleeding. 3. Type 2 diabetes 4. Hyperlipidemia 5. Squamous cell carcinoma of scalp with radiation and surgery.  6. GERD 7. H/o back surgery  8. HTN 9. Pulmonary hypertension/RV failure: Echo (7/19) with EF 45-50%, moderate focal basal septal hypertrophy, mild diffuse hypokinesis, septal flattening, severe RV dilation with severely decreased systolic function, moderate TR, PASP 61 mmHg.   - CT chest (7/19): Mild emphysema, enlarged PA.  - V/Q scan (8/19): No evidence for chronic PE.  - anti-centromere Ab positive, ANA positive, RF 73 => suspect CREST syndrome.  - RHC (10/19): mean RA 7, PA 64/21 mean 38, mean PCWP  8, CI 1.7, PVR 11.6 WU.  - Echo (3/20): EF 55-60%, moderately decreased RV systolic function, PASP 64 mmHg 10. CAD: LHC (10/19) with 95% proximal LAD and 95% ostial D2.  Complicated bifurcation intervention with atherectomy and DES to proximal LAD and ostial D2.   - LHC (3/20): 40% mLAD, 30% mRCA.  11. Post-cath right groin pseudoaneurysm in 10/19.   SH: Lives in [place] alone, daughter lives very close.  Quit smoking > 20 years ago. No ETOH.   FH: No history of pulmonary hypertension.  Mother with "heart  problems."  Sister with PPM.    Review of systems complete and found to be negative unless listed in HPI.   Current Outpatient Medications  Medication Sig Dispense Refill  . ADEMPAS 1.5 MG TABS Take 1.5 mg by mouth 3 (three) times daily.     Marland Kitchen ALPRAZolam (XANAX) 0.5 MG tablet Take 0.5 mg by mouth daily as needed.    Marland Kitchen aspirin EC 81 MG tablet Take 1 tablet (81 mg total) by mouth daily. 30 tablet 11  . atorvastatin (LIPITOR) 80 MG tablet Take 1 tablet (80 mg total) by mouth daily. 30 tablet 3  . carvedilol (COREG) 3.125 MG tablet TAKE 1 TABLET BY MOUTH TWICE DAILY WITH A MEAL 60 tablet 6  . clopidogrel (PLAVIX) 75 MG tablet Take 1 tablet (75 mg total) by mouth daily. 30 tablet 3  . cyclobenzaprine (FLEXERIL) 10 MG tablet Take 10 mg by mouth at bedtime.    . furosemide (LASIX) 40 MG tablet TAKE 1 TABLET(40 MG) BY MOUTH DAILY 30 tablet 11  . gabapentin (NEURONTIN) 300 MG capsule Take 300 mg by mouth at bedtime.     . lactose free nutrition (BOOST) LIQD Take 237 mLs by mouth 2 (two) times daily between meals.     . mirtazapine (REMERON) 30 MG tablet Take 30 mg by mouth at bedtime.    . OPSUMIT 10 MG tablet TAKE 1 TABLET DAILY - WILL NEED AN APPOINTMENT WITH DR. Aundra Dubin FOR FURTHER REFILLS. 30 tablet 1  . pantoprazole (PROTONIX) 40 MG tablet Take 40 mg by mouth daily.    . potassium chloride SA (KLOR-CON) 20 MEQ tablet TAKE 1 TABLET(20 MEQ) BY MOUTH DAILY 30 tablet 6  . Selexipag 200 MCG TABS Take 1 tablet (200 mcg total) by mouth daily. 30 tablet 11  . traMADol-acetaminophen (ULTRACET) 37.5-325 MG tablet Take 2 tablets by mouth 4 (four) times daily as needed for pain.  2   No current facility-administered medications for this encounter.   BP 130/82   Pulse 88   Wt 68.4 kg (150 lb 12.8 oz)   SpO2 98% Comment: 4L of oxygen  BMI 27.58 kg/m   Wt Readings from Last 3 Encounters:  09/24/19 68.4 kg (150 lb 12.8 oz)  11/04/18 64.9 kg (143 lb)  10/10/18 64.5 kg (142 lb 3.2 oz)   BP 130/82    Pulse 88   Wt 68.4 kg (150 lb 12.8 oz)   SpO2 98% Comment: 4L of oxygen  BMI 27.58 kg/m  General: NAD Neck: JVP 8 cm, no thyromegaly or thyroid nodule.  Lungs: Clear to auscultation bilaterally with normal respiratory effort. CV: Nondisplaced PMI.  Heart regular S1/S2, no S3/S4, 2/6 HSM LLSB.  No peripheral edema.  No carotid bruit.  Normal pedal pulses.  Abdomen: Soft, nontender, no hepatosplenomegaly, no distention.  Skin: Intact without lesions or rashes.  Neurologic: Alert and oriented x 3.  Psych: Normal affect. Extremities: No clubbing or cyanosis.  HEENT: Normal.   Assessment/Plan: 1. CAD: Coronary angiography showed complex bifurcation lesion with 95% stenosis proximal LAD with calcification at take-off of large D2. The ostial/proximal D2 also had 95% calcified stenosis. Suspect this lesion played a role in her exertional dyspnea and also in her mildly decreased LV systolic function (EF 67-34% on echo in 7/19).  S/p successful atherectomy with bifurcation stenting of proximal LAD and D2 05/15/18.  Repeat LHC in 3/20 showed nonobstructive disease.  No chest pain.  - Continue ASA 81.  - I will have her continue Plavix for now given complex intervention.  - Continue atorvastatin, good lipids in 12/20.   2.Pulmonary hypertension: She appearedto have at least moderate pulmonary hypertension by echo in 7/19 with RV failure. CT chest showed mild emphysema, which should not explain her degree of RV failure. V/Q scan did not show evidence for chronic PEs.Anti-centromere antibody, ANA, and RF were all positive. ?CREST variant of scleroderma.She remains quite symptomatic and is on home oxygen.[place] 05/15/18 showed moderate PAH but very high PVR and low cardiac output. This was concerning for advanced pulmonary hypertension.   - Continue to titrate up riociguat.  - Continue Opsumit 10 mg daily.  - She can only tolerate Selexipag at 200 mcg daily but thinks that even the low dose has  helped.  - Refuses sleep study.  - Continue home oxygen.  - BNP today.  - I will arrange for repeat echo.  3. Chronic diastolic CHF/RV failure:  NYHA III.  She is not volume overloaded on exam.  - Continue Lasix 40 mg daily + KCl 20 daily.  BMET today.  4. ?CREST syndrome: Per patient, Dr. Amil Amen did not think that she had CREST.      Loralie Champagne 09/26/2019

## 2019-09-28 ENCOUNTER — Telehealth (HOSPITAL_COMMUNITY): Payer: Self-pay

## 2019-09-28 NOTE — Telephone Encounter (Signed)
HH orders signed and fa

## 2019-09-28 NOTE — Telephone Encounter (Signed)
HH orders signed and faxed back to [facility]. Confirmation received

## 2019-09-29 ENCOUNTER — Telehealth (HOSPITAL_COMMUNITY): Payer: Self-pay

## 2019-09-29 NOTE — Telephone Encounter (Signed)
Received update from [employer].   Date of Visit: 09/27/19  Adempas 1.32m tab po three times daily.  "patient's respose to therapy/ education: patient  Is tolerating. No side effects reported. Patient and daughter verbalized understanding of all aspects of Adempas therapy."  Next visit: 10/19/2019  Comments: bp 107/61

## 2019-10-05 ENCOUNTER — Ambulatory Visit (HOSPITAL_COMMUNITY): Admission: RE | Admit: 2019-10-05 | Payer: Medicare Other | Source: Ambulatory Visit

## 2019-10-07 DIAGNOSIS — I270 Primary pulmonary hypertension: Secondary | ICD-10-CM | POA: Diagnosis not present

## 2019-10-12 ENCOUNTER — Other Ambulatory Visit: Payer: Self-pay

## 2019-10-12 ENCOUNTER — Ambulatory Visit (HOSPITAL_COMMUNITY)
Admission: RE | Admit: 2019-10-12 | Discharge: 2019-10-12 | Disposition: A | Discharge status: Home / Self Care | Payer: Medicare Other | Source: Ambulatory Visit | Attending: Cardiology | Admitting: Cardiology

## 2019-10-12 DIAGNOSIS — I272 Pulmonary hypertension, unspecified: Secondary | ICD-10-CM | POA: Diagnosis not present

## 2019-10-12 DIAGNOSIS — I509 Heart failure, unspecified: Secondary | ICD-10-CM | POA: Insufficient documentation

## 2019-10-12 DIAGNOSIS — I2510 Atherosclerotic heart disease of native coronary artery without angina pectoris: Secondary | ICD-10-CM | POA: Diagnosis not present

## 2019-10-12 DIAGNOSIS — E785 Hyperlipidemia, unspecified: Secondary | ICD-10-CM | POA: Insufficient documentation

## 2019-10-12 DIAGNOSIS — N189 Chronic kidney disease, unspecified: Secondary | ICD-10-CM | POA: Insufficient documentation

## 2019-10-12 DIAGNOSIS — E1122 Type 2 diabetes mellitus with diabetic chronic kidney disease: Secondary | ICD-10-CM | POA: Insufficient documentation

## 2019-10-12 DIAGNOSIS — I130 Hypertensive heart and chronic kidney disease with heart failure and stage 1 through stage 4 chronic kidney disease, or unspecified chronic kidney disease: Secondary | ICD-10-CM | POA: Insufficient documentation

## 2019-10-12 DIAGNOSIS — I700 Atherosclerosis of aorta: Secondary | ICD-10-CM | POA: Insufficient documentation

## 2019-10-12 NOTE — Progress Notes (Signed)
  Echocardiogram 2D Echocardiogram has been performed.  [name] 10/12/2019, 2:43 PM

## 2019-10-14 ENCOUNTER — Other Ambulatory Visit (HOSPITAL_COMMUNITY): Payer: Self-pay

## 2019-10-14 DIAGNOSIS — R0902 Hypoxemia: Secondary | ICD-10-CM | POA: Diagnosis not present

## 2019-10-14 MED ORDER — OPSUMIT 10 MG PO TABS: 10.0000 mg | ORAL_TABLET | Freq: Every day | ORAL | 11 refills | Status: DC

## 2019-10-22 ENCOUNTER — Telehealth (HOSPITAL_COMMUNITY): Payer: Self-pay | Admitting: Pharmacist

## 2019-10-22 MED ORDER — ADEMPAS 2 MG PO TABS: 2.0000 mg | ORAL_TABLET | Freq: Three times a day (TID) | ORAL | 11 refills | Status: DC

## 2019-10-22 NOTE — Telephone Encounter (Signed)
[place] Visit Summary  Received message from [facility]. Patient has been titrated up to Adempas 2 mg TID. Patient is tolerating medication with no side effects reported.   BP 141/72 Weight 138 lbs  Next visit 11/16/19  Will update medication list to reflect current dose of Adempas.   Audry Riles, PharmD, BCPS, BCCP, CPP Heart Failure Clinic Pharmacist [phone]

## 2019-10-25 ENCOUNTER — Other Ambulatory Visit (HOSPITAL_COMMUNITY): Payer: Self-pay | Admitting: *Deleted

## 2019-10-25 MED ORDER — CARVEDILOL 3.125 MG PO TABS: ORAL_TABLET | ORAL | 6 refills | Status: DC

## 2019-11-02 DIAGNOSIS — E119 Type 2 diabetes mellitus without complications: Secondary | ICD-10-CM | POA: Diagnosis not present

## 2019-11-02 DIAGNOSIS — Z9181 History of falling: Secondary | ICD-10-CM | POA: Diagnosis not present

## 2019-11-02 DIAGNOSIS — I2510 Atherosclerotic heart disease of native coronary artery without angina pectoris: Secondary | ICD-10-CM | POA: Diagnosis not present

## 2019-11-02 DIAGNOSIS — M545 Low back pain: Secondary | ICD-10-CM | POA: Diagnosis not present

## 2019-11-02 DIAGNOSIS — Z139 Encounter for screening, unspecified: Secondary | ICD-10-CM | POA: Diagnosis not present

## 2019-11-02 DIAGNOSIS — M1990 Unspecified osteoarthritis, unspecified site: Secondary | ICD-10-CM | POA: Diagnosis not present

## 2019-11-02 DIAGNOSIS — G8929 Other chronic pain: Secondary | ICD-10-CM | POA: Diagnosis not present

## 2019-11-02 DIAGNOSIS — J9611 Chronic respiratory failure with hypoxia: Secondary | ICD-10-CM | POA: Diagnosis not present

## 2019-11-14 DIAGNOSIS — R0902 Hypoxemia: Secondary | ICD-10-CM | POA: Diagnosis not present

## 2019-11-25 ENCOUNTER — Other Ambulatory Visit (HOSPITAL_COMMUNITY): Payer: Self-pay

## 2019-11-25 MED ORDER — CLOPIDOGREL BISULFATE 75 MG PO TABS: 75.0000 mg | ORAL_TABLET | Freq: Every day | ORAL | 3 refills | Status: DC

## 2019-11-25 MED ORDER — ATORVASTATIN CALCIUM 80 MG PO TABS: 80.0000 mg | ORAL_TABLET | Freq: Every day | ORAL | 3 refills | Status: DC

## 2019-12-14 DIAGNOSIS — R0902 Hypoxemia: Secondary | ICD-10-CM | POA: Diagnosis not present

## 2019-12-22 ENCOUNTER — Ambulatory Visit (HOSPITAL_COMMUNITY)
Admission: RE | Admit: 2019-12-22 | Discharge: 2019-12-22 | Disposition: A | Discharge status: Home / Self Care | Payer: Medicare Other | Source: Ambulatory Visit | Attending: Cardiology | Admitting: Cardiology

## 2019-12-22 ENCOUNTER — Other Ambulatory Visit: Payer: Self-pay

## 2019-12-22 VITALS — BP 118/58 | HR 83 | Wt 145.8 lb

## 2019-12-22 DIAGNOSIS — I110 Hypertensive heart disease with heart failure: Secondary | ICD-10-CM | POA: Diagnosis not present

## 2019-12-22 DIAGNOSIS — J439 Emphysema, unspecified: Secondary | ICD-10-CM | POA: Insufficient documentation

## 2019-12-22 DIAGNOSIS — Z955 Presence of coronary angioplasty implant and graft: Secondary | ICD-10-CM | POA: Insufficient documentation

## 2019-12-22 DIAGNOSIS — E785 Hyperlipidemia, unspecified: Secondary | ICD-10-CM | POA: Diagnosis not present

## 2019-12-22 DIAGNOSIS — Z85828 Personal history of other malignant neoplasm of skin: Secondary | ICD-10-CM | POA: Diagnosis not present

## 2019-12-22 DIAGNOSIS — E1169 Type 2 diabetes mellitus with other specified complication: Secondary | ICD-10-CM | POA: Insufficient documentation

## 2019-12-22 DIAGNOSIS — I50810 Right heart failure, unspecified: Secondary | ICD-10-CM

## 2019-12-22 DIAGNOSIS — Z79899 Other long term (current) drug therapy: Secondary | ICD-10-CM | POA: Insufficient documentation

## 2019-12-22 DIAGNOSIS — Z7982 Long term (current) use of aspirin: Secondary | ICD-10-CM | POA: Insufficient documentation

## 2019-12-22 DIAGNOSIS — I25119 Atherosclerotic heart disease of native coronary artery with unspecified angina pectoris: Secondary | ICD-10-CM

## 2019-12-22 DIAGNOSIS — Z7902 Long term (current) use of antithrombotics/antiplatelets: Secondary | ICD-10-CM | POA: Diagnosis not present

## 2019-12-22 DIAGNOSIS — Z9981 Dependence on supplemental oxygen: Secondary | ICD-10-CM | POA: Insufficient documentation

## 2019-12-22 DIAGNOSIS — I272 Pulmonary hypertension, unspecified: Secondary | ICD-10-CM | POA: Diagnosis not present

## 2019-12-22 DIAGNOSIS — Z87891 Personal history of nicotine dependence: Secondary | ICD-10-CM | POA: Diagnosis not present

## 2019-12-22 DIAGNOSIS — K219 Gastro-esophageal reflux disease without esophagitis: Secondary | ICD-10-CM | POA: Diagnosis not present

## 2019-12-22 DIAGNOSIS — Z8739 Personal history of other diseases of the musculoskeletal system and connective tissue: Secondary | ICD-10-CM | POA: Diagnosis not present

## 2019-12-22 DIAGNOSIS — I2510 Atherosclerotic heart disease of native coronary artery without angina pectoris: Secondary | ICD-10-CM | POA: Diagnosis not present

## 2019-12-22 DIAGNOSIS — I5032 Chronic diastolic (congestive) heart failure: Secondary | ICD-10-CM | POA: Diagnosis not present

## 2019-12-22 LAB — BASIC METABOLIC PANEL
Anion gap: 9 (ref 5–15)
BUN: 26 mg/dL — ABNORMAL HIGH (ref 8–23)
CO2: 29 mmol/L (ref 22–32)
Calcium: 8.9 mg/dL (ref 8.9–10.3)
Chloride: 100 mmol/L (ref 98–111)
Creatinine, Ser: 0.94 mg/dL (ref 0.44–1.00)
GFR calc Af Amer: >60 mL/min (ref >60)
GFR calc non Af Amer: 55 mL/min — ABNORMAL LOW (ref >60)
Glucose, Bld: 172 mg/dL — ABNORMAL HIGH (ref 70–99)
Potassium: 4.2 mmol/L (ref 3.5–5.1)
Sodium: 138 mmol/L (ref 135–145)

## 2019-12-22 LAB — BRAIN NATRIURETIC PEPTIDE: B Natriuretic Peptide: 201.7 pg/mL — ABNORMAL HIGH (ref 0.0–100.0)

## 2019-12-22 MED ORDER — FUROSEMIDE 40 MG PO TABS: ORAL_TABLET | ORAL | 5 refills | Status: DC

## 2019-12-22 MED ORDER — ORENITRAM 0.125 MG PO TBCR: 0.1250 mg | EXTENDED_RELEASE_TABLET | Freq: Three times a day (TID) | ORAL | 5 refills | Status: DC

## 2019-12-22 NOTE — Patient Instructions (Signed)
Labs done today. We will contact you only if your labs are abnormal.  INCREASE Lasix 36m(1 tablet) by mouth every morning and 241m1/2 tablet) by mouth every evening.   START Orenitram 0.1258m tablet) by mouth 3 times daily. This medication was sent to [facility] and they should contact you about scheduling a shipment to be sent to you.  STOP Uptravi (Continue medication until you receive Orenitram)  No other medication changes were made. Please continue all other medication as prescribed.  Your physician recommends that you schedule a follow-up appointment in: 10days for a lab only appointment(prescription was provided to you in office to have labs done at pcp) and in 6 weeks for an appointment with Dr. McLAundra Dubint [facility] and your health needs are our priority. As part of our continuing mission to provide you with exceptional heart care, we have created designated Provider Care Teams. These Care Teams include your primary Cardiologist (physician) and Advanced Practice Providers (APPs- Physician Assistants and Nurse Practitioners) who all work together to provide you with the care you need, when you need it.   You may see any of the following providers on your designated Care Team at your next follow up: . DMarland Kitchen DanGlori BickersDr DalLoralie ChampagneAmyDarrick GrinderP . BriLyda JesterA . LauAudry RilesharmD   Please be sure to bring in all your medications bottles to every appointment.

## 2019-12-22 NOTE — Progress Notes (Signed)
ID:  [name], DOB [birth year], MRN [id number]   Provider location: [place] [facility] Type of Visit: Established patient   PCP:  [name], PA-C  Cardiologist:  Jenkins Rouge, MD HF Cardiology: Dr. Aundra Dubin   History of Present Illness: [name] is an 86 y.o. with history of DM, HTN, and prior osteomyelitis of jaw referred by Dr. Johnsie Cancel for evaluation of pulmonary hypertension/RV failure.  She reports an episode of PNA in 3/19 and says that she never recovered from this.  She had gradually progressive exertional dyspnea to the point where she was short of breath walking around the house.  She had an echo in 7/19 that showed mildly decreased LV systolic function but severely decreased RV systolic function with dilated RV and at least moderate pulmonary hypertension by echo estimation.  She saw her PCP in 8/19 and was noted to be hypoxemic at rest.  He started her on home oxygen 2 L/[place] which she continues.    She was referred to cardiology, saw Dr. Johnsie Cancel. Amlodipine was stopped and she was started empirically on sildenafil 20 mg tid for pulmonary hypertension.   She was referred to CHF clinic.    RHC/LHC was done in 10/19, showing complex severe bifurcation lesion involving the proximal LAD and large D2 ostium. She had complex PCI requiring atherectomy and DES to pLAD and ostial D2. RHC showed PAH with PVR 11.6 WU and CI 1.7.  She subsequently was found to have a right groin pseudoaneurysm from cath.   She was started on Opsumit.  Serologies have suggested CREST syndrome.  However, she saw Dr. Amil Amen for rheumatology who told her that he did not think that she had a rheumatologic diagnosis (per her report).   Cardiolite was abnormal in 3/20, this was followed by [facility] in 3/20 that showed nonobstructive CAD.  Echo in 3/20 showed EF 55-60%, with moderately decreased RV systolic function, PASP 64 mmHg.   Echo (3/21) showed EF 55-60%, moderately dilated RV with  moderately decreased systolic function, PASP 38 mmHg.   She returns today for followup of pulmonary hypertension and CAD.  She continues to wear home oxygen 4L [place].  She is on selexipag 200 mcg daily, unable to tolerate increased dose.  She continues on Opsumit and riociguat at goal dose.  Dyspnea has been mildly worse recently.  She is short of breath after walking about 100 feet. Her daughter brought her in in a wheelchair today.  She has had some ankle edema.  No chest pain, no lightheadedness/dizziness/falls. No orthopnea/PND. Weight down 5 lbs. She wants to put off 6 minute walk until next appt.   6 minute walk (12/19): 79 m 6 minute walk (1/20): 121 m 6 minute walk (2/21): 91 m  Labs (12/19): K 3.9, creatinine 0.78 Labs (3/20): K 4, creatinine 1.1, hgb 10.2 Labs (12/20): LDL 67, TGs 28, K 4.4, creatinine 1.04 Labs (2/21): BNP 181, K 4, creatinine 0.94  PMH: 1. H/o osteomyelitis of jaw.  2. Duodenal ulcers with upper GI bleeding. 3. Type 2 diabetes 4. Hyperlipidemia 5. Squamous cell carcinoma of scalp with radiation and surgery.  6. GERD 7. H/o back surgery  8. HTN 9. Pulmonary hypertension/RV failure: Echo (7/19) with EF 45-50%, moderate focal basal septal hypertrophy, mild diffuse hypokinesis, septal flattening, severe RV dilation with severely decreased systolic function, moderate TR, PASP 61 mmHg.   - CT chest (7/19): Mild emphysema, enlarged PA.  - V/Q scan (8/19): No evidence for chronic  PE.  - anti-centromere Ab positive, ANA positive, RF 73 => suspect CREST syndrome.  - RHC (10/19): mean RA 7, PA 64/21 mean 38, mean PCWP 8, CI 1.7, PVR 11.6 WU.  - Echo (3/20): EF 55-60%, moderately decreased RV systolic function, PASP 64 mmHg - Echo (3/21): EF 55-60%, moderately dilated RV with moderately decreased systolic function, PASP 38 mmHg. 10. CAD: LHC (10/19) with 95% proximal LAD and 95% ostial D2.  Complicated bifurcation intervention with atherectomy and DES to proximal LAD and  ostial D2.   - LHC (3/20): 40% mLAD, 30% mRCA.  11. Post-cath right groin pseudoaneurysm in 10/19.   SH: Lives in [place] alone, daughter lives very close.  Quit smoking > 20 years ago. No ETOH.   FH: No history of pulmonary hypertension.  Mother with "heart problems."  Sister with PPM.    Review of systems complete and found to be negative unless listed in HPI.   Current Outpatient Medications  Medication Sig Dispense Refill  . ALPRAZolam (XANAX) 0.5 MG tablet Take 0.5 mg by mouth daily as needed.    Marland Kitchen aspirin EC 81 MG tablet Take 1 tablet (81 mg total) by mouth daily. 30 tablet 11  . atorvastatin (LIPITOR) 80 MG tablet Take 1 tablet (80 mg total) by mouth daily. 90 tablet 3  . carvedilol (COREG) 3.125 MG tablet TAKE 1 TABLET BY MOUTH TWICE DAILY WITH A MEAL 60 tablet 6  . clopidogrel (PLAVIX) 75 MG tablet Take 1 tablet (75 mg total) by mouth daily. 90 tablet 3  . cyclobenzaprine (FLEXERIL) 10 MG tablet Take 10 mg by mouth at bedtime.    . furosemide (LASIX) 40 MG tablet Take 1 tablet (40 mg total) by mouth every morning AND 0.5 tablets (20 mg total) every evening. 45 tablet 5  . gabapentin (NEURONTIN) 300 MG capsule Take 300 mg by mouth at bedtime.     . lactose free nutrition (BOOST) LIQD Take 237 mLs by mouth 2 (two) times daily between meals.     . mirtazapine (REMERON) 30 MG tablet Take 30 mg by mouth at bedtime.    . OPSUMIT 10 MG tablet Take 1 tablet (10 mg total) by mouth daily. 30 tablet 11  . pantoprazole (PROTONIX) 40 MG tablet Take 40 mg by mouth daily.    . potassium chloride SA (KLOR-CON) 20 MEQ tablet TAKE 1 TABLET(20 MEQ) BY MOUTH DAILY 30 tablet 6  . Riociguat (ADEMPAS) 2 MG TABS Take 2 mg by mouth 3 (three) times daily. 90 tablet 11  . traMADol-acetaminophen (ULTRACET) 37.5-325 MG tablet Take 2 tablets by mouth 4 (four) times daily as needed for pain.  2  . Treprostinil Diolamine ER (ORENITRAM) 0.125 MG TBCR Take 0.125 mg by mouth 3 (three) times daily. 90 tablet 5    No current facility-administered medications for this encounter.   BP (!) 118/58   Pulse 83   Wt 66.1 kg (145 lb 12.8 oz)   SpO2 96%   BMI 26.67 kg/m   Wt Readings from Last 3 Encounters:  12/22/19 66.1 kg (145 lb 12.8 oz)  09/24/19 68.4 kg (150 lb 12.8 oz)  11/04/18 64.9 kg (143 lb)   BP (!) 118/58   Pulse 83   Wt 66.1 kg (145 lb 12.8 oz)   SpO2 96%   BMI 26.67 kg/m  General: NAD Neck: JVP 8 cm with HJR, no thyromegaly or thyroid nodule.  Lungs: Mild crackles at bases CV: Nondisplaced PMI.  Heart regular S1/S2, no S3/S4, no  murmur.  No peripheral edema.  No carotid bruit.  Normal pedal pulses.  Abdomen: Soft, nontender, no hepatosplenomegaly, no distention.  Skin: Intact without lesions or rashes.  Neurologic: Alert and oriented x 3.  Psych: Normal affect. Extremities: No clubbing or cyanosis.  HEENT: Normal.   Assessment/Plan: 1. CAD: Coronary angiography showed complex bifurcation lesion with 95% stenosis proximal LAD with calcification at take-off of large D2. The ostial/proximal D2 also had 95% calcified stenosis. Suspect this lesion played a role in her exertional dyspnea and also in her mildly decreased LV systolic function (EF 47-09% on echo in 7/19).  S/p successful atherectomy with bifurcation stenting of proximal LAD and D2 05/15/18.  Repeat LHC in 3/20 showed nonobstructive disease.  No chest pain.  - Continue ASA 81.  - I will have her continue Plavix for now given complex intervention.  - Continue atorvastatin, good lipids in 12/20.   2.Pulmonary hypertension: She appearedto have at least moderate pulmonary hypertension by echo in 7/19 with RV failure. CT chest showed mild emphysema, which should not explain her degree of RV failure. V/Q scan did not show evidence for chronic PEs.Anti-centromere antibody, ANA, and RF were all positive. ?CREST variant of scleroderma.She remains quite symptomatic and is on home oxygen.[place] 05/15/18 showed moderate PAH but  very high PVR and low cardiac output. This was concerning for advanced pulmonary hypertension.  Repeat echo in 3/21 showed moderately dilated/dysfunctional RV but PASP estimate was lower.  - Continue riociguat.  - Continue Opsumit 10 mg daily.  - She can only tolerate Selexipag at 200 mcg daily.  I will try transitioning this to orenitram to see if she tolerates better.    - Refuses sleep study.  - Continue home oxygen.  - BNP today.  - 6 minute walk next appt.  3. Chronic diastolic CHF/RV failure:  NYHA III. I think she has mild volume overload even though her weight is down.  - Increase Lasix to 40 qam/20 qpm, BMET today and in 10 days.   4. ?CREST syndrome: Per patient, Dr. Amil Amen did not think that she had CREST.     Followup in 6 wks.   Loralie Champagne 12/22/2019

## 2020-01-11 ENCOUNTER — Telehealth (HOSPITAL_COMMUNITY): Payer: Self-pay | Admitting: *Deleted

## 2020-01-11 NOTE — Telephone Encounter (Signed)
Received a vm that patient was feeling dizzy/sluggish today her bp is 120/55. No other complaints. I called back to get more information because pts bp normally runs close to that. No answer on [phone]

## 2020-01-14 DIAGNOSIS — R0902 Hypoxemia: Secondary | ICD-10-CM | POA: Diagnosis not present

## 2020-01-24 ENCOUNTER — Other Ambulatory Visit (HOSPITAL_COMMUNITY): Payer: Self-pay | Admitting: *Deleted

## 2020-01-24 MED ORDER — ORENITRAM 0.125 MG PO TBCR: 0.1250 mg | EXTENDED_RELEASE_TABLET | Freq: Three times a day (TID) | ORAL | 5 refills | Status: DC

## 2020-01-25 ENCOUNTER — Telehealth (HOSPITAL_COMMUNITY): Payer: Self-pay | Admitting: Pharmacy Technician

## 2020-01-25 NOTE — Telephone Encounter (Signed)
Received call that patient's prescription was sent into [facility] but they have yet to receive the associated patient enrollment paperwork that goes with it.  Called and spoke with patient, will mail her application to sign and then send in to [employer].  Will follow up.

## 2020-02-02 ENCOUNTER — Telehealth (HOSPITAL_COMMUNITY): Payer: Self-pay | Admitting: Pharmacist

## 2020-02-02 NOTE — Telephone Encounter (Signed)
Advanced Heart Failure Patient Advocate Encounter  Prior Authorization for [name] has been approved.    PA# [id number] Effective dates: 02/02/2020 through 08/04/2020  [name], PharmD, BCPS, BCCP, CPP Heart Failure Clinic Pharmacist [phone]

## 2020-02-02 NOTE — Telephone Encounter (Signed)
Spoke with patient who stated she sent her paperwork back through the mail yesterday.  Will follow up.

## 2020-02-02 NOTE — Telephone Encounter (Signed)
Patient Advocate Encounter   Received notification from [facility] that prior authorization for [name] is required.   PA submitted on CoverMyMeds Key [id number] Status is pending   Will continue to follow.  Audry Riles, PharmD, BCPS, BCCP, CPP Heart Failure Clinic Pharmacist [phone]

## 2020-02-08 ENCOUNTER — Encounter (HOSPITAL_COMMUNITY): Payer: Medicare Other | Admitting: Cardiology

## 2020-02-11 ENCOUNTER — Other Ambulatory Visit (HOSPITAL_COMMUNITY): Payer: Self-pay | Admitting: *Deleted

## 2020-02-11 MED ORDER — POTASSIUM CHLORIDE CRYS ER 20 MEQ PO TBCR: EXTENDED_RELEASE_TABLET | ORAL | 6 refills | Status: DC

## 2020-02-13 DIAGNOSIS — R0902 Hypoxemia: Secondary | ICD-10-CM | POA: Diagnosis not present

## 2020-02-14 NOTE — Telephone Encounter (Signed)
Received another form for the patient to sign. Sending that to her in the mail.  Will follow up.

## 2020-02-15 DIAGNOSIS — M545 Low back pain: Secondary | ICD-10-CM | POA: Diagnosis not present

## 2020-02-15 DIAGNOSIS — D649 Anemia, unspecified: Secondary | ICD-10-CM | POA: Diagnosis not present

## 2020-02-15 DIAGNOSIS — G8929 Other chronic pain: Secondary | ICD-10-CM | POA: Diagnosis not present

## 2020-02-15 DIAGNOSIS — E119 Type 2 diabetes mellitus without complications: Secondary | ICD-10-CM | POA: Diagnosis not present

## 2020-02-15 DIAGNOSIS — N183 Chronic kidney disease, stage 3 unspecified: Secondary | ICD-10-CM | POA: Diagnosis not present

## 2020-02-15 DIAGNOSIS — M1990 Unspecified osteoarthritis, unspecified site: Secondary | ICD-10-CM | POA: Diagnosis not present

## 2020-02-15 DIAGNOSIS — Z79899 Other long term (current) drug therapy: Secondary | ICD-10-CM | POA: Diagnosis not present

## 2020-02-18 ENCOUNTER — Telehealth (HOSPITAL_COMMUNITY): Payer: Self-pay | Admitting: Cardiology

## 2020-02-18 NOTE — Telephone Encounter (Signed)
[name] with [facility] called to inquire about medication changes.  Reports patient will be switching from Uptravi 200 mcg daily to [facility], would like to know if an overlapping dose needed?  Per verbal order Dr Maceo Pro for patient to discontinue Uptravi and begin Orenitram without overlapping dosing.  Lam,Pharm D-with [facility] ([phone])  aware will place order. Nothing further needed at this time

## 2020-02-18 NOTE — Telephone Encounter (Signed)
Advanced Heart Failure Patient Advocate Encounter   Patient was approved to receive Orenitram from [employer].   Effective dates: 02/16/20 through 02/15/21  Spoke with patient, she should receive the medication Tuesday.  Charlann Boxer, CPhT

## 2020-02-26 ENCOUNTER — Other Ambulatory Visit (HOSPITAL_COMMUNITY): Payer: Self-pay | Admitting: Cardiology

## 2020-03-10 ENCOUNTER — Telehealth (HOSPITAL_COMMUNITY): Payer: Self-pay | Admitting: Pharmacy Technician

## 2020-03-10 NOTE — Telephone Encounter (Signed)
Spoke with patient today. [employer] temporarily approved patient to receive Opsumit until 05/07/2020. I am sending the packet for her to sign to be approved for the rest of this year.  Will follow up.

## 2020-03-15 DIAGNOSIS — R0902 Hypoxemia: Secondary | ICD-10-CM | POA: Diagnosis not present

## 2020-03-17 NOTE — Telephone Encounter (Signed)
Spoke with patient's daughter Caryl Asp, after finding out that her [place] has been exhausted. We are in the process of getting her assistance from [employer]. Called and spoke with Pleas Patricia, the representative stated that they asked the insurance company to expedite her benefits investigation so that they can send the patient Adempas sooner. She will take her last tablet on Sunday.  Will call back Monday and see what the status is.  Will follow up.

## 2020-03-20 NOTE — Telephone Encounter (Signed)
Advanced Heart Failure Patient Advocate Encounter   Patient was approved to receive Adempas from [employer] [phone]).  Patient Case ID: [id number] Effective dates: 03/20/20 through 08/04/20  The representative stated that they should be reaching out to the patient by 5 pm but that the patient could call them if she has not heard from the pharmacy by that time. I confirmed that they had the patient's daughter Joy's information.   The representative stated that they would be able to ship the medication overnight to the patient if needed. I advised that she took her last dose on Sunday, so that would be best.  Called and spoke with Joy. I conveyed the information to her that was shared with me. She will give [employer] a call if they have not called her by 5. I advised her to call me with any issues. I will likely check in with her tomorrow to be sure that the medication is on its way to the patient.

## 2020-03-21 DIAGNOSIS — D509 Iron deficiency anemia, unspecified: Secondary | ICD-10-CM | POA: Diagnosis not present

## 2020-03-22 NOTE — Telephone Encounter (Signed)
Sent in patient portion to [employer] for Opsumit.   Will follow up on status of application.   Patient's daughter confirmed that they received the Adempas today and she has already taken it.

## 2020-04-04 ENCOUNTER — Other Ambulatory Visit (HOSPITAL_COMMUNITY): Payer: Self-pay | Admitting: Cardiology

## 2020-04-04 NOTE — Telephone Encounter (Signed)
Advanced Heart Failure Patient Advocate Encounter   Patient was approved to receive Opsumit through 08/04/20 from [employer]. Called [employer], the patient received a shipment on 03/24/20.  Charlann Boxer, CPhT

## 2020-04-12 ENCOUNTER — Ambulatory Visit (HOSPITAL_COMMUNITY)
Admission: RE | Admit: 2020-04-12 | Discharge: 2020-04-12 | Disposition: A | Discharge status: Home / Self Care | Payer: Medicare Other | Source: Ambulatory Visit | Attending: Cardiology | Admitting: Cardiology

## 2020-04-12 ENCOUNTER — Other Ambulatory Visit: Payer: Self-pay

## 2020-04-12 VITALS — BP 125/85 | HR 70 | Wt 140.4 lb

## 2020-04-12 DIAGNOSIS — Z7982 Long term (current) use of aspirin: Secondary | ICD-10-CM | POA: Insufficient documentation

## 2020-04-12 DIAGNOSIS — Z85828 Personal history of other malignant neoplasm of skin: Secondary | ICD-10-CM | POA: Insufficient documentation

## 2020-04-12 DIAGNOSIS — Z923 Personal history of irradiation: Secondary | ICD-10-CM | POA: Diagnosis not present

## 2020-04-12 DIAGNOSIS — E119 Type 2 diabetes mellitus without complications: Secondary | ICD-10-CM | POA: Insufficient documentation

## 2020-04-12 DIAGNOSIS — I25119 Atherosclerotic heart disease of native coronary artery with unspecified angina pectoris: Secondary | ICD-10-CM

## 2020-04-12 DIAGNOSIS — Z87891 Personal history of nicotine dependence: Secondary | ICD-10-CM | POA: Insufficient documentation

## 2020-04-12 DIAGNOSIS — K219 Gastro-esophageal reflux disease without esophagitis: Secondary | ICD-10-CM | POA: Insufficient documentation

## 2020-04-12 DIAGNOSIS — I110 Hypertensive heart disease with heart failure: Secondary | ICD-10-CM | POA: Diagnosis not present

## 2020-04-12 DIAGNOSIS — I5032 Chronic diastolic (congestive) heart failure: Secondary | ICD-10-CM | POA: Diagnosis not present

## 2020-04-12 DIAGNOSIS — Z7902 Long term (current) use of antithrombotics/antiplatelets: Secondary | ICD-10-CM | POA: Insufficient documentation

## 2020-04-12 DIAGNOSIS — Z955 Presence of coronary angioplasty implant and graft: Secondary | ICD-10-CM | POA: Diagnosis not present

## 2020-04-12 DIAGNOSIS — J439 Emphysema, unspecified: Secondary | ICD-10-CM | POA: Diagnosis not present

## 2020-04-12 DIAGNOSIS — I272 Pulmonary hypertension, unspecified: Secondary | ICD-10-CM | POA: Diagnosis not present

## 2020-04-12 DIAGNOSIS — Z8739 Personal history of other diseases of the musculoskeletal system and connective tissue: Secondary | ICD-10-CM | POA: Insufficient documentation

## 2020-04-12 DIAGNOSIS — Z79899 Other long term (current) drug therapy: Secondary | ICD-10-CM | POA: Diagnosis not present

## 2020-04-12 DIAGNOSIS — E785 Hyperlipidemia, unspecified: Secondary | ICD-10-CM | POA: Diagnosis not present

## 2020-04-12 DIAGNOSIS — I2510 Atherosclerotic heart disease of native coronary artery without angina pectoris: Secondary | ICD-10-CM | POA: Diagnosis not present

## 2020-04-12 LAB — BASIC METABOLIC PANEL
Anion gap: 11 (ref 5–15)
BUN: 56 mg/dL — ABNORMAL HIGH (ref 8–23)
CO2: 25 mmol/L (ref 22–32)
Calcium: 9.5 mg/dL (ref 8.9–10.3)
Chloride: 100 mmol/L (ref 98–111)
Creatinine, Ser: 1.07 mg/dL — ABNORMAL HIGH (ref 0.44–1.00)
GFR calc Af Amer: 54 mL/min — ABNORMAL LOW (ref >60)
GFR calc non Af Amer: 47 mL/min — ABNORMAL LOW (ref >60)
Glucose, Bld: 164 mg/dL — ABNORMAL HIGH (ref 70–99)
Potassium: 4.6 mmol/L (ref 3.5–5.1)
Sodium: 136 mmol/L (ref 135–145)

## 2020-04-12 LAB — LIPID PANEL
Cholesterol: 121 mg/dL (ref 0–200)
HDL: 25 mg/dL — ABNORMAL LOW (ref >40)
LDL Cholesterol: 38 mg/dL (ref 0–99)
Total CHOL/HDL Ratio: 4.8 RATIO
Triglycerides: 289 mg/dL — ABNORMAL HIGH (ref <150)
VLDL: 58 mg/dL — ABNORMAL HIGH (ref 0–40)

## 2020-04-12 LAB — BRAIN NATRIURETIC PEPTIDE: B Natriuretic Peptide: 99.8 pg/mL (ref 0.0–100.0)

## 2020-04-12 NOTE — Patient Instructions (Signed)
Labs done today, we will notify you of any abnormal results  Your physician recommends that you schedule a follow-up appointment in: 3 months  If you have any questions or concerns before your next appointment please send [place] a message through [place] or call our office at [phone].    TO LEAVE A MESSAGE FOR THE NURSE SELECT OPTION 2, PLEASE LEAVE A MESSAGE INCLUDING: . YOUR NAME . DATE OF BIRTH . CALL BACK NUMBER . REASON FOR CALL**this is important as we prioritize the call backs  [place] AS LONG AS YOU CALL BEFORE 4:00 PM  At the [facility], you and your health needs are our priority. As part of our continuing mission to provide you with exceptional heart care, we have created designated Provider Care Teams. These Care Teams include your primary Cardiologist (physician) and Advanced Practice Providers (APPs- Physician Assistants and Nurse Practitioners) who all work together to provide you with the care you need, when you need it.   You may see any of the following providers on your designated Care Team at your next follow up: Marland Kitchen Dr Glori Bickers . Dr Loralie Champagne . Darrick Grinder, NP . Lyda Jester, PA . Audry Riles, PharmD   Please be sure to bring in all your medications bottles to every appointment.

## 2020-04-12 NOTE — Progress Notes (Signed)
6 Min Walk Test Completed  Pt ambulated 152.67mO2 Sat ranged 91-93 on 4L oxygen HR ranged 97-117

## 2020-04-12 NOTE — Progress Notes (Signed)
ID:  [name], DOB January 07, 1932, MRN [id number]   Provider location: [place] [facility] Type of Visit: Established patient   PCP:  [name], PA-C  Cardiologist:  Jenkins Rouge, MD HF Cardiology: Dr. Aundra Dubin   History of Present Illness: [name] is an 86 y.o. with history of DM, HTN, and prior osteomyelitis of jaw referred by Dr. Johnsie Cancel for evaluation of pulmonary hypertension/RV failure.  She reports an episode of PNA in 3/19 and says that she never recovered from this.  She had gradually progressive exertional dyspnea to the point where she was short of breath walking around the house.  She had an echo in 7/19 that showed mildly decreased LV systolic function but severely decreased RV systolic function with dilated RV and at least moderate pulmonary hypertension by echo estimation.  She saw her PCP in 8/19 and was noted to be hypoxemic at rest.  He started her on home oxygen 2 L/[place] which she continues.    She was referred to cardiology, saw Dr. Johnsie Cancel. Amlodipine was stopped and she was started empirically on sildenafil 20 mg tid for pulmonary hypertension.   She was referred to CHF clinic.    RHC/LHC was done in 10/19, showing complex severe bifurcation lesion involving the proximal LAD and large D2 ostium. She had complex PCI requiring atherectomy and DES to pLAD and ostial D2. RHC showed PAH with PVR 11.6 WU and CI 1.7.  She subsequently was found to have a right groin pseudoaneurysm from cath.   She was started on Opsumit.  Serologies have suggested CREST syndrome.  However, she saw Dr. Amil Amen for rheumatology who told her that he did not think that she had a rheumatologic diagnosis (per her report).   Cardiolite was abnormal in 3/20, this was followed by [facility] in 3/20 that showed nonobstructive CAD.  Echo in 3/20 showed EF 55-60%, with moderately decreased RV systolic function, PASP 64 mmHg.   Echo (3/21) showed EF 55-60%, moderately dilated RV with  moderately decreased systolic function, PASP 38 mmHg.   She returns today for followup of pulmonary hypertension and CAD.  She continues to wear home oxygen 4L [place].  She is now on orenitram, slowly increasing the dose.  She was very symptomatic with last increase (fatigue) but this has resolved and she is now tolerating it.  She continues on Opsumit and riociguat at goal dose.  In terms of breathing, dyspnea is improved.  She is now able to walk around her house without shortness of breath.  She is able to do housework.  No chest pain.  No lightheadedness.  No orthopnea/PND. Weight is down 5 lbs.   6 minute walk (12/19): 79 m 6 minute walk (1/20): 121 m 6 minute walk (2/21): 91 m 6 minute walk (9/21): 152 m  Labs (12/19): K 3.9, creatinine 0.78 Labs (3/20): K 4, creatinine 1.1, hgb 10.2 Labs (12/20): LDL 67, TGs 28, K 4.4, creatinine 1.04 Labs (2/21): BNP 181, K 4, creatinine 0.94 Labs (5/21): K 4.2, creatinine 0.94, BNP 202 Labs (8/21): hgb 10.4  PMH: 1. H/o osteomyelitis of jaw.  2. Duodenal ulcers with upper GI bleeding. 3. Type 2 diabetes 4. Hyperlipidemia 5. Squamous cell carcinoma of scalp with radiation and surgery.  6. GERD 7. H/o back surgery  8. HTN 9. Pulmonary hypertension/RV failure: Echo (7/19) with EF 45-50%, moderate focal basal septal hypertrophy, mild diffuse hypokinesis, septal flattening, severe RV dilation with severely decreased systolic function, moderate TR, PASP  61 mmHg.   - CT chest (7/19): Mild emphysema, enlarged PA.  - V/Q scan (8/19): No evidence for chronic PE.  - anti-centromere Ab positive, ANA positive, RF 73 => suspect CREST syndrome.  - RHC (10/19): mean RA 7, PA 64/21 mean 38, mean PCWP 8, CI 1.7, PVR 11.6 WU.  - Echo (3/20): EF 55-60%, moderately decreased RV systolic function, PASP 64 mmHg - Echo (3/21): EF 55-60%, moderately dilated RV with moderately decreased systolic function, PASP 38 mmHg. 10. CAD: LHC (10/19) with 95% proximal LAD and 95%  ostial D2.  Complicated bifurcation intervention with atherectomy and DES to proximal LAD and ostial D2.   - LHC (3/20): 40% mLAD, 30% mRCA.  11. Post-cath right groin pseudoaneurysm in 10/19.   SH: Lives in [place] alone, daughter lives very close.  Quit smoking > 20 years ago. No ETOH.   FH: No history of pulmonary hypertension.  Mother with "heart problems."  Sister with PPM.    Review of systems complete and found to be negative unless listed in HPI.   Current Outpatient Medications  Medication Sig Dispense Refill   ALPRAZolam (XANAX) 0.5 MG tablet Take 0.5 mg by mouth daily as needed.     aspirin EC 81 MG tablet Take 1 tablet (81 mg total) by mouth daily. 30 tablet 11   atorvastatin (LIPITOR) 80 MG tablet Take 1 tablet (80 mg total) by mouth daily. 90 tablet 3   carvedilol (COREG) 3.125 MG tablet TAKE 1 TABLET BY MOUTH TWICE DAILY WITH A MEAL 60 tablet 6   clopidogrel (PLAVIX) 75 MG tablet Take 1 tablet (75 mg total) by mouth daily. 90 tablet 3   cyclobenzaprine (FLEXERIL) 10 MG tablet Take 10 mg by mouth at bedtime.     esomeprazole (NEXIUM) 20 MG packet Take 20 mg by mouth daily before breakfast.     FERROUS SULFATE PO Take 40 mg by mouth daily.     furosemide (LASIX) 40 MG tablet Take 1 tablet (40 mg total) by mouth every morning AND 0.5 tablets (20 mg total) every evening. 45 tablet 5   gabapentin (NEURONTIN) 300 MG capsule Take 300 mg by mouth at bedtime.      lactose free nutrition (BOOST) LIQD Take 237 mLs by mouth 2 (two) times daily between meals.      mirtazapine (REMERON) 30 MG tablet Take 30 mg by mouth at bedtime.     OPSUMIT 10 MG tablet Take 1 tablet (10 mg total) by mouth daily. 30 tablet 11   pantoprazole (PROTONIX) 40 MG tablet Take 40 mg by mouth daily.     potassium chloride SA (KLOR-CON) 20 MEQ tablet TAKE 1 TABLET(20 MEQ) BY MOUTH DAILY 30 tablet 6   Riociguat (ADEMPAS) 2 MG TABS Take 2 mg by mouth 3 (three) times daily. 90 tablet 11    traMADol-acetaminophen (ULTRACET) 37.5-325 MG tablet Take 2 tablets by mouth 4 (four) times daily as needed for pain.  2   Treprostinil Diolamine ER (ORENITRAM) 0.125 MG TBCR Take 0.125 mg by mouth 3 (three) times daily. 90 tablet 5   No current facility-administered medications for this encounter.   BP 125/85    Pulse 70    Wt 63.7 kg (140 lb 6.4 oz)    SpO2 97%    BMI 25.68 kg/m   Wt Readings from Last 3 Encounters:  04/12/20 63.7 kg (140 lb 6.4 oz)  12/22/19 66.1 kg (145 lb 12.8 oz)  09/24/19 68.4 kg (150 lb 12.8 oz)  BP 125/85    Pulse 70    Wt 63.7 kg (140 lb 6.4 oz)    SpO2 97%    BMI 25.68 kg/m  General: NAD Neck: No JVD, no thyromegaly or thyroid nodule.  Lungs: Clear to auscultation bilaterally with normal respiratory effort. CV: Nondisplaced PMI.  Heart regular S1/S2, no S3/S4, no murmur.  No peripheral edema.  No carotid bruit.  Normal pedal pulses.  Abdomen: Soft, nontender, no hepatosplenomegaly, no distention.  Skin: Intact without lesions or rashes.  Neurologic: Alert and oriented x 3.  Psych: Normal affect. Extremities: No clubbing or cyanosis.  HEENT: Normal.   Assessment/Plan: 1. CAD: Coronary angiography showed complex bifurcation lesion with 95% stenosis proximal LAD with calcification at take-off of large D2. The ostial/proximal D2 also had 95% calcified stenosis. Suspect this lesion played a role in her exertional dyspnea and also in her mildly decreased LV systolic function (EF 01-04% on echo in 7/19).  S/p successful atherectomy with bifurcation stenting of proximal LAD and D2 05/15/18.  Repeat LHC in 3/20 showed nonobstructive disease.  No chest pain.  - Continue ASA 81.  - I will have her continue Plavix for now given complex intervention.  - Continue atorvastatin, check lipids today.    2.Pulmonary hypertension: She appearedto have at least moderate pulmonary hypertension by echo in 7/19 with RV failure. CT chest showed mild emphysema, which should not  explain her degree of RV failure. V/Q scan did not show evidence for chronic PEs.Anti-centromere antibody, ANA, and RF were all positive. ?CREST variant of scleroderma.She remains quite symptomatic and is on home oxygen.[place] 05/15/18 showed moderate PAH but very high PVR and low cardiac output. This was concerning for advanced pulmonary hypertension.  Repeat echo in 3/21 showed moderately dilated/dysfunctional RV but PASP estimate was lower.  She did not tolerate Uptravi.  6 minute walk today is improved.  - Continue riociguat.  - Continue Opsumit 10 mg daily.  - Continue orenitram, have been titrating slowly due to symptoms.  I will have her continue the current dose for a month then try to increase.    - Refuses sleep study.  - Continue home oxygen.  - BNP today.  3. Chronic diastolic CHF/RV failure:  NYHA II-III.  She is not volume overloaded on exam.  - Continue Lasix 40 qam/20 qpm, BMET today.   4. ?CREST syndrome: Per patient, Dr. Amil Amen did not think that she had CREST.     Followup in 3 months.   Loralie Champagne 04/12/2020

## 2020-04-13 ENCOUNTER — Telehealth (HOSPITAL_COMMUNITY): Payer: Self-pay

## 2020-04-13 DIAGNOSIS — E785 Hyperlipidemia, unspecified: Secondary | ICD-10-CM

## 2020-04-13 MED ORDER — ICOSAPENT ETHYL 1 G PO CAPS: 2.0000 g | ORAL_CAPSULE | Freq: Two times a day (BID) | ORAL | 11 refills | Status: DC

## 2020-04-13 NOTE — Telephone Encounter (Signed)
-----  Message from Larey Dresser, MD sent at 04/12/2020  1:28 PM EDT ----- High BUN, would hold Lasix x 1 day then decrease to 40 mg daily.  BMET 10 days.  High triglycerides => would add Vascepa 2 g bid with lipids in 2 months.

## 2020-04-13 NOTE — Telephone Encounter (Signed)
Patient advised and verbalized understanding. New rx sent into patients pharmacy. Med list updated to reflect changes,pt will have repeat labs drawn at pcp office. Future labs entered for patients next appt

## 2020-04-15 DIAGNOSIS — R0902 Hypoxemia: Secondary | ICD-10-CM | POA: Diagnosis not present

## 2020-04-24 DIAGNOSIS — Z79899 Other long term (current) drug therapy: Secondary | ICD-10-CM | POA: Diagnosis not present

## 2020-05-05 ENCOUNTER — Other Ambulatory Visit (HOSPITAL_COMMUNITY): Payer: Self-pay | Admitting: Cardiology

## 2020-05-07 ENCOUNTER — Other Ambulatory Visit (HOSPITAL_COMMUNITY): Payer: Self-pay | Admitting: Cardiology

## 2020-05-08 ENCOUNTER — Other Ambulatory Visit (HOSPITAL_COMMUNITY): Payer: Self-pay | Admitting: Cardiology

## 2020-05-15 DIAGNOSIS — R0902 Hypoxemia: Secondary | ICD-10-CM | POA: Diagnosis not present

## 2020-05-17 DIAGNOSIS — G8929 Other chronic pain: Secondary | ICD-10-CM | POA: Diagnosis not present

## 2020-05-17 DIAGNOSIS — E119 Type 2 diabetes mellitus without complications: Secondary | ICD-10-CM | POA: Diagnosis not present

## 2020-05-17 DIAGNOSIS — M545 Low back pain, unspecified: Secondary | ICD-10-CM | POA: Diagnosis not present

## 2020-05-17 DIAGNOSIS — D649 Anemia, unspecified: Secondary | ICD-10-CM | POA: Diagnosis not present

## 2020-05-17 DIAGNOSIS — Z23 Encounter for immunization: Secondary | ICD-10-CM | POA: Diagnosis not present

## 2020-05-17 DIAGNOSIS — M1990 Unspecified osteoarthritis, unspecified site: Secondary | ICD-10-CM | POA: Diagnosis not present

## 2020-05-22 DIAGNOSIS — S300XXA Contusion of lower back and pelvis, initial encounter: Secondary | ICD-10-CM | POA: Diagnosis not present

## 2020-06-15 DIAGNOSIS — R0902 Hypoxemia: Secondary | ICD-10-CM | POA: Diagnosis not present

## 2020-06-16 ENCOUNTER — Telehealth (HOSPITAL_COMMUNITY): Payer: Self-pay | Admitting: Cardiology

## 2020-06-16 NOTE — Telephone Encounter (Signed)
Patients daughter called regarding orinatram dosage Reports she had to back down the dose a few weeks ago and reports patient is willing to increase back to original dose.\ Advised to contact speciality pharm nurse for instructions to titrate back to goal dose.  Reports understanding, advised ok for nurse to call office with an questions or to update orders if needed

## 2020-06-23 ENCOUNTER — Encounter (HOSPITAL_COMMUNITY): Payer: Self-pay

## 2020-06-23 ENCOUNTER — Other Ambulatory Visit: Payer: Self-pay

## 2020-06-23 ENCOUNTER — Observation Stay (HOSPITAL_COMMUNITY)
Admission: EM | Admit: 2020-06-23 | Discharge: 2020-06-24 | Disposition: A | Discharge status: Home / Self Care | Payer: Medicare Other | Attending: Family Medicine | Admitting: Family Medicine

## 2020-06-23 ENCOUNTER — Emergency Department (HOSPITAL_COMMUNITY): Payer: Medicare Other

## 2020-06-23 DIAGNOSIS — I25110 Atherosclerotic heart disease of native coronary artery with unstable angina pectoris: Secondary | ICD-10-CM | POA: Insufficient documentation

## 2020-06-23 DIAGNOSIS — R6889 Other general symptoms and signs: Secondary | ICD-10-CM | POA: Diagnosis not present

## 2020-06-23 DIAGNOSIS — Z96653 Presence of artificial knee joint, bilateral: Secondary | ICD-10-CM | POA: Diagnosis not present

## 2020-06-23 DIAGNOSIS — R1310 Dysphagia, unspecified: Secondary | ICD-10-CM | POA: Diagnosis not present

## 2020-06-23 DIAGNOSIS — Z7984 Long term (current) use of oral hypoglycemic drugs: Secondary | ICD-10-CM | POA: Diagnosis not present

## 2020-06-23 DIAGNOSIS — K222 Esophageal obstruction: Secondary | ICD-10-CM | POA: Insufficient documentation

## 2020-06-23 DIAGNOSIS — Z20822 Contact with and (suspected) exposure to covid-19: Secondary | ICD-10-CM | POA: Diagnosis not present

## 2020-06-23 DIAGNOSIS — Z7982 Long term (current) use of aspirin: Secondary | ICD-10-CM | POA: Diagnosis not present

## 2020-06-23 DIAGNOSIS — R1110 Vomiting, unspecified: Secondary | ICD-10-CM | POA: Diagnosis not present

## 2020-06-23 DIAGNOSIS — R0789 Other chest pain: Secondary | ICD-10-CM | POA: Diagnosis not present

## 2020-06-23 DIAGNOSIS — K2100 Gastro-esophageal reflux disease with esophagitis, without bleeding: Secondary | ICD-10-CM | POA: Insufficient documentation

## 2020-06-23 DIAGNOSIS — R112 Nausea with vomiting, unspecified: Secondary | ICD-10-CM | POA: Diagnosis not present

## 2020-06-23 DIAGNOSIS — Z87891 Personal history of nicotine dependence: Secondary | ICD-10-CM | POA: Insufficient documentation

## 2020-06-23 DIAGNOSIS — R0902 Hypoxemia: Secondary | ICD-10-CM | POA: Diagnosis not present

## 2020-06-23 DIAGNOSIS — E1122 Type 2 diabetes mellitus with diabetic chronic kidney disease: Secondary | ICD-10-CM | POA: Insufficient documentation

## 2020-06-23 DIAGNOSIS — Z743 Need for continuous supervision: Secondary | ICD-10-CM | POA: Diagnosis not present

## 2020-06-23 DIAGNOSIS — I129 Hypertensive chronic kidney disease with stage 1 through stage 4 chronic kidney disease, or unspecified chronic kidney disease: Secondary | ICD-10-CM | POA: Diagnosis not present

## 2020-06-23 DIAGNOSIS — Z79899 Other long term (current) drug therapy: Secondary | ICD-10-CM | POA: Diagnosis not present

## 2020-06-23 DIAGNOSIS — N183 Chronic kidney disease, stage 3 unspecified: Secondary | ICD-10-CM | POA: Diagnosis not present

## 2020-06-23 DIAGNOSIS — R531 Weakness: Secondary | ICD-10-CM | POA: Diagnosis not present

## 2020-06-23 DIAGNOSIS — J90 Pleural effusion, not elsewhere classified: Secondary | ICD-10-CM | POA: Diagnosis not present

## 2020-06-23 DIAGNOSIS — K449 Diaphragmatic hernia without obstruction or gangrene: Secondary | ICD-10-CM | POA: Diagnosis not present

## 2020-06-23 DIAGNOSIS — R079 Chest pain, unspecified: Secondary | ICD-10-CM | POA: Diagnosis not present

## 2020-06-23 LAB — COMPREHENSIVE METABOLIC PANEL
ALT: 11 U/L (ref 0–44)
AST: 21 U/L (ref 15–41)
Albumin: 3.3 g/dL — ABNORMAL LOW (ref 3.5–5.0)
Alkaline Phosphatase: 56 U/L (ref 38–126)
Anion gap: 13 (ref 5–15)
BUN: 22 mg/dL (ref 8–23)
CO2: 25 mmol/L (ref 22–32)
Calcium: 9.4 mg/dL (ref 8.9–10.3)
Chloride: 102 mmol/L (ref 98–111)
Creatinine, Ser: 1.04 mg/dL — ABNORMAL HIGH (ref 0.44–1.00)
GFR, Estimated: 52 mL/min — ABNORMAL LOW (ref >60)
Glucose, Bld: 168 mg/dL — ABNORMAL HIGH (ref 70–99)
Potassium: 4 mmol/L (ref 3.5–5.1)
Sodium: 140 mmol/L (ref 135–145)
Total Bilirubin: 0.4 mg/dL (ref 0.3–1.2)
Total Protein: 7.8 g/dL (ref 6.5–8.1)

## 2020-06-23 LAB — CBC WITH DIFFERENTIAL/PLATELET
Abs Immature Granulocytes: 0.02 10*3/uL (ref 0.00–0.07)
Basophils Absolute: 0 10*3/uL (ref 0.0–0.1)
Basophils Relative: 1 %
Eosinophils Absolute: 0.3 10*3/uL (ref 0.0–0.5)
Eosinophils Relative: 4 %
HCT: 37.9 % (ref 36.0–46.0)
Hemoglobin: 11.1 g/dL — ABNORMAL LOW (ref 12.0–15.0)
Immature Granulocytes: 0 %
Lymphocytes Relative: 21 %
Lymphs Abs: 1.3 10*3/uL (ref 0.7–4.0)
MCH: 26.7 pg (ref 26.0–34.0)
MCHC: 29.3 g/dL — ABNORMAL LOW (ref 30.0–36.0)
MCV: 91.3 fL (ref 80.0–100.0)
Monocytes Absolute: 0.5 10*3/uL (ref 0.1–1.0)
Monocytes Relative: 7 %
Neutro Abs: 4.4 10*3/uL (ref 1.7–7.7)
Neutrophils Relative %: 67 %
Platelets: 278 10*3/uL (ref 150–400)
RBC: 4.15 MIL/uL (ref 3.87–5.11)
RDW: 15 % (ref 11.5–15.5)
WBC: 6.5 10*3/uL (ref 4.0–10.5)
nRBC: 0 % (ref 0.0–0.2)

## 2020-06-23 LAB — MAGNESIUM: Magnesium: 1.8 mg/dL (ref 1.7–2.4)

## 2020-06-23 LAB — RESPIRATORY PANEL BY RT PCR (FLU A&B, COVID)
Influenza A by PCR: NEGATIVE
Influenza B by PCR: NEGATIVE
SARS Coronavirus 2 by RT PCR: NEGATIVE

## 2020-06-23 LAB — TROPONIN I (HIGH SENSITIVITY)
Troponin I (High Sensitivity): 23 ng/L — ABNORMAL HIGH (ref <18)
Troponin I (High Sensitivity): 27 ng/L — ABNORMAL HIGH (ref <18)

## 2020-06-23 LAB — LIPASE, BLOOD: Lipase: 33 U/L (ref 11–51)

## 2020-06-23 MED ORDER — SODIUM CHLORIDE 0.9 % IV BOLUS
500.0000 mL | Freq: Once | INTRAVENOUS | Status: AC
  Administered 2020-06-23: 500 mL via INTRAVENOUS

## 2020-06-23 MED ORDER — IOHEXOL 300 MG/ML  SOLN
100.0000 mL | Freq: Once | INTRAMUSCULAR | Status: AC | PRN
  Administered 2020-06-23: 100 mL via INTRAVENOUS

## 2020-06-23 MED ORDER — ASPIRIN 300 MG RE SUPP
150.0000 mg | Freq: Once | RECTAL | Status: AC
  Administered 2020-06-23: 150 mg via RECTAL
  Filled 2020-06-23: qty 1

## 2020-06-23 MED ORDER — SODIUM CHLORIDE 0.9 % IV SOLN: INTRAVENOUS | Status: DC

## 2020-06-23 MED ORDER — METOPROLOL TARTRATE 5 MG/5ML IV SOLN
2.5000 mg | Freq: Four times a day (QID) | INTRAVENOUS | Status: DC
  Filled 2020-06-23: qty 5

## 2020-06-23 MED ORDER — PANTOPRAZOLE SODIUM 40 MG IV SOLR
40.0000 mg | Freq: Two times a day (BID) | INTRAVENOUS | Status: DC
  Administered 2020-06-23 – 2020-06-24 (×2): 40 mg via INTRAVENOUS
  Filled 2020-06-23 (×2): qty 40

## 2020-06-23 NOTE — Progress Notes (Signed)
06/23/20 2026  Vitals  BP (!) 108/58  BP Location Left Arm  BP Method Automatic  Patient Position (if appropriate) Lying  Pulse Rate 83  Pulse Rate Source Dinamap  Resp 19  Level of Consciousness  Level of Consciousness Alert  MEWS COLOR  MEWS Score Color Green  Oxygen Therapy  SpO2 98 %  O2 Device Nasal Cannula  O2 Flow Rate (L/min) 4 L/min  MEWS Score  MEWS Temp 0  MEWS Systolic 0  MEWS Pulse 0  MEWS RR 0  MEWS LOC 0  MEWS Score 0   Patient due to metoprolol. MD paged for parameters in relation to BP

## 2020-06-23 NOTE — Consult Note (Signed)
Referring Provider: Dr. Octaviano Glow Primary Care Physician:  [name], PA-C Primary Gastroenterologist:  Althia Forts (Dr. Odie Sera)  Reason for Consultation:  Esophageal/gastric obstruction  HPI: [name] is a 86 y.o. female with past medical history pertinent for dysphagia with esophageal dilations, CKD, CVA, pulmonary hypertension, PAD, on Plavix presenting for consultation of suspected esophageal/gastric obstruction.  Patient stated that on the morning of Monday, November 15, she went to take her pills and immediately regurgitated them.  Since that time, she has been unable to keep food down, though she does tolerate oral secretions and small sips of liquids.  She reports mild chest pain but denies abdominal pain.  Has chronic GERD, states she was recently changed to a different antacid, though she cannot remember the name.  Reports intermittent dysphagia, stating she has had multiple dilations and also occasionally has to induce vomiting to get food to pass.  Denies any inciting incident to current symptoms, though daughter states that she believes patient had chicken prior to symptom onset.  Denies hematemesis  Patient denies any recent melena or hematochezia.  Denies changes in bowel habits.  She denies any unexplained weight loss or changes in appetite.  She is on Plavix but states her last dose was Monday because she has been unable to take her pills since then.  Last EGD was in 2018 and revealed a benign-appearing esophageal stenosis, LA Grade C reflux esophagitis, Medium-sized hiatal hernia, Multiple non-bleeding duodenal ulcers with pigmented material.   Past Medical History:  Diagnosis Date  . Anxiety   . Arthritis    osteo; "mostly hands, knees, probably back too" (12/09/2016)  . Chronic lower back pain   . Complication of anesthesia 2011   resp distress -on vent after surgery  . Coronary artery disease   . Depression   . Diabetes ([place])   . Esophageal  stricture   . GERD (gastroesophageal reflux disease)   . Headache    out grew them  . High cholesterol   . History of blood transfusion 2011; ?03/2012   "related to ORs" (12/09/2016)  . Hypertension   . Intestinal obstruction ([place])   . Macular degeneration of left eye    dx over 55 yrs ago.....hasn't changed much  . Shingles   . Squamous carcinoma 2013   squamas cell on scalp--took 14 radiation tx--2013  . Stroke [facility]) 1967   Mini stroke;  No lasting deficits  . Type II diabetes mellitus ([place]) dx'd 11/2016    Past Surgical History:  Procedure Laterality Date  . ABDOMINAL HYSTERECTOMY    . APPENDECTOMY    . BACK SURGERY    . CATARACT EXTRACTION, BILATERAL Bilateral   . CHOLECYSTECTOMY    . COLON SURGERY    . COLONOSCOPY    . CORONARY ATHERECTOMY N/A 05/15/2018   Procedure: CORONARY ATHERECTOMY;  Surgeon: Leonie Man, MD;  Location: [facility];  Service: Cardiovascular;  Laterality: N/A;  . CORONARY STENT INTERVENTION N/A 05/15/2018   Procedure: CORONARY STENT INTERVENTION;  Surgeon: Leonie Man, MD;  Location: [facility];  Service: Cardiovascular;  Laterality: N/A;  . ESOPHAGOGASTRODUODENOSCOPY (EGD) WITH ESOPHAGEAL DILATION  "several times"  . ESOPHAGOGASTRODUODENOSCOPY (EGD) WITH PROPOFOL N/A 12/31/2016   Procedure: ESOPHAGOGASTRODUODENOSCOPY (EGD) WITH PROPOFOL;  Surgeon: Wilford Corner, MD;  Location: [facility];  Service: Endoscopy;  Laterality: N/A;  . EYE SURGERY    . FRACTURE SURGERY    . IR FLUORO GUIDE CV LINE RIGHT  01/02/2017  . IR US GUIDE  VASC ACCESS RIGHT  01/02/2017  . JOINT REPLACEMENT    . LEFT HEART CATH AND CORONARY ANGIOGRAPHY N/A 10/09/2018   Procedure: LEFT HEART CATH AND CORONARY ANGIOGRAPHY;  Surgeon: Larey Dresser, MD;  Location: [facility];  Service: Cardiovascular;  Laterality: N/A;  . LUMBAR DISC SURGERY  10/25/2014   Right L4-L5 removal of seven free fragments of disk mostly posterolaterally.  Lysis of adhesion.   Microscope/notes 10/26/2014  . LUMBAR LAMINECTOMY/DECOMPRESSION MICRODISCECTOMY Right 08/09/2014   Procedure: Right Lumbar Four to Five Microdiskectomy;  Surgeon: Floyce Stakes, MD;  Location: [facility];  Service: Neurosurgery;  Laterality: Right;  Right L4-5 Microdiskectomy  . ORIF PERIPROSTHETIC FRACTURE  03/31/2012   Procedure: OPEN REDUCTION INTERNAL FIXATION (ORIF) PERIPROSTHETIC FRACTURE;  Surgeon: Mauri Pole, MD;  Location: [facility];  Service: Orthopedics;  Laterality: Left;  Open reduction internal fixation Left distal femur periprosthetic fracture  . RIGHT/LEFT HEART CATH AND CORONARY ANGIOGRAPHY N/A 05/14/2018   Procedure: RIGHT/LEFT HEART CATH AND CORONARY ANGIOGRAPHY;  Surgeon: Larey Dresser, MD;  Location: [facility];  Service: Cardiovascular;  Laterality: N/A;  . SMALL INTESTINE SURGERY  2011   "really bad blockage; went into respiratory distress and was on ventilator after surgery; [facility]"  . SQUAMOUS CELL CARCINOMA EXCISION  ~ 2012   S/P "cut off her head then 15 radiation txs"   . TOTAL KNEE ARTHROPLASTY Bilateral    bilateral    Prior to Admission medications   Medication Sig Start Date End Date Taking? Authorizing Provider  ALPRAZolam Duanne Moron) 0.5 MG tablet Take 0.5 mg by mouth daily as needed for anxiety.  09/14/19  Yes [provider]  aspirin EC 81 MG tablet Take 1 tablet (81 mg total) by mouth daily. 05/16/18  Yes Bhagat, Bhavinkumar, PA  atorvastatin (LIPITOR) 80 MG tablet Take 1 tablet (80 mg total) by mouth daily. 11/25/19  Yes Larey Dresser, MD  carvedilol (COREG) 3.125 MG tablet TAKE 1 TABLET BY MOUTH TWICE DAILY WITH A MEAL Patient taking differently: Take 3.125 mg by mouth 2 (two) times daily with a meal.  05/08/20  Yes Larey Dresser, MD  clopidogrel (PLAVIX) 75 MG tablet Take 1 tablet (75 mg total) by mouth daily. 11/25/19  Yes Larey Dresser, MD  cyclobenzaprine (FLEXERIL) 10 MG tablet Take 10 mg by mouth at bedtime.   Yes  [provider]  esomeprazole (NEXIUM) 40 MG capsule Take 40 mg by mouth daily before breakfast.    Yes [provider]  FERROUS SULFATE PO Take 40 mg by mouth daily.   Yes [provider]  furosemide (LASIX) 40 MG tablet Take 1 tablet (40 mg total) by mouth every morning AND 0.5 tablets (20 mg total) every evening. Patient taking differently: Take 1 tablet (40 mg total) by mouth every morning AND take 0.5 tablets (20 mg total) by mouth every evening. 12/22/19  Yes Larey Dresser, MD  gabapentin (NEURONTIN) 300 MG capsule Take 300 mg by mouth 3 (three) times daily.    Yes [provider]  icosapent Ethyl (VASCEPA) 1 g capsule Take 2 capsules (2 g total) by mouth 2 (two) times daily. 04/13/20  Yes Larey Dresser, MD  lactose free nutrition (BOOST) LIQD Take 237 mLs by mouth 2 (two) times daily between meals.    Yes [provider]  mirtazapine (REMERON) 30 MG tablet Take 30 mg by mouth at bedtime.   Yes [provider]  OPSUMIT 10 MG  tablet Take 1 tablet (10 mg total) by mouth daily. 10/14/19  Yes Larey Dresser, MD  potassium chloride SA (KLOR-CON) 20 MEQ tablet TAKE 1 TABLET(20 MEQ) BY MOUTH DAILY Patient taking differently: Take 20 mEq by mouth daily.  02/11/20  Yes Larey Dresser, MD  Riociguat (ADEMPAS) 2 MG TABS Take 2 mg by mouth 3 (three) times daily. 10/22/19  Yes Larey Dresser, MD  traMADol-acetaminophen (ULTRACET) 37.5-325 MG tablet Take 2 tablets by mouth 4 (four) times daily as needed for pain. 05/07/18  Yes [provider]  Treprostinil Diolamine ER (ORENITRAM) 0.125 MG TBCR Take 0.125 mg by mouth 3 (three) times daily. 01/24/20  Yes Larey Dresser, MD  carvedilol (COREG) 3.125 MG tablet TAKE 1 TABLET BY MOUTH TWICE DAILY WITH A MEAL Patient not taking: Reported on 06/23/2020 05/08/20   Larey Dresser, MD    Scheduled Meds: Continuous Infusions: . sodium chloride     PRN Meds:.  Allergies as of 06/23/2020 -  Review Complete 06/23/2020  Allergen Reaction Noted  . Sulfa antibiotics Swelling 03/30/2012    No family history on file.  Social History   Socioeconomic History  . Marital status: Widowed    Spouse name: Not on file  . Number of children: Not on file  . Years of education: Not on file  . Highest education level: Not on file  Occupational History  . Not on file  Tobacco Use  . Smoking status: Former Smoker    Packs/day: 0.10    Years: 10.00    Pack years: 1.00    Types: Cigarettes    Quit date: 03/31/1991    Years since quitting: 29.2  . Smokeless tobacco: Never Used  Vaping Use  . Vaping Use: Never used  Substance and Sexual Activity  . Alcohol use: No  . Drug use: No  . Sexual activity: Never  Other Topics Concern  . Not on file  Social History Narrative  . Not on file   Social Determinants of Health   Financial Resource Strain:   . Difficulty of Paying Living Expenses: Not on file  Food Insecurity:   . Worried About Charity fundraiser in the Last Year: Not on file  . Ran Out of Food in the Last Year: Not on file  Transportation Needs:   . Lack of Transportation (Medical): Not on file  . Lack of Transportation (Non-Medical): Not on file  Physical Activity:   . Days of Exercise per Week: Not on file  . Minutes of Exercise per Session: Not on file  Stress:   . Feeling of Stress : Not on file  Social Connections:   . Frequency of Communication with Friends and Family: Not on file  . Frequency of Social Gatherings with Friends and Family: Not on file  . Attends Religious Services: Not on file  . Active Member of Clubs or Organizations: Not on file  . Attends Archivist Meetings: Not on file  . Marital Status: Not on file  Intimate Partner Violence:   . Fear of Current or Ex-Partner: Not on file  . Emotionally Abused: Not on file  . Physically Abused: Not on file  . Sexually Abused: Not on file    Review of Systems: Review of Systems   Constitutional: Negative for chills, fever and weight loss.  HENT: Negative for sore throat and tinnitus.   Eyes: Negative for pain and redness.  Respiratory: Negative for cough and shortness of breath.  Cardiovascular: Positive for chest pain. Negative for palpitations.  Gastrointestinal: Positive for heartburn. Negative for abdominal pain, blood in stool, constipation, diarrhea, melena, nausea and vomiting.  Genitourinary: Negative for flank pain and hematuria.  Musculoskeletal: Negative for falls and joint pain.  Skin: Negative for itching and rash.  Neurological: Negative for seizures and loss of consciousness.  Endo/Heme/Allergies: Negative for polydipsia. Does not bruise/bleed easily.  Psychiatric/Behavioral: Negative for substance abuse. The patient is not nervous/anxious.    Physical Exam: Vital signs: Vitals:   06/23/20 1500 06/23/20 1515  BP: 134/68 137/64  Pulse: 94 92  Resp: 14 17  Temp:    SpO2: 97% 96%      Physical Exam Vitals reviewed.  Constitutional:      General: She is not in acute distress. HENT:     Head: Normocephalic and atraumatic.     Nose: Nose normal. No congestion.     Mouth/Throat:     Mouth: Mucous membranes are moist.     Pharynx: Oropharynx is clear.  Eyes:     General: No scleral icterus.    Extraocular Movements: Extraocular movements intact.     Conjunctiva/sclera: Conjunctivae normal.  Cardiovascular:     Rate and Rhythm: Normal rate and regular rhythm.     Pulses: Normal pulses.  Pulmonary:     Effort: Pulmonary effort is normal. No respiratory distress.     Breath sounds: Normal breath sounds.  Abdominal:     General: Bowel sounds are normal. There is no distension.     Palpations: Abdomen is soft. There is no mass.     Tenderness: There is no abdominal tenderness. There is no guarding or rebound.     Hernia: No hernia is present.  Musculoskeletal:        General: No swelling or tenderness.     Cervical back: Normal range  of motion and neck supple.  Skin:    General: Skin is warm and dry.  Neurological:     General: No focal deficit present.     Mental Status: She is oriented to person, place, and time. She is lethargic.  Psychiatric:        Mood and Affect: Mood normal.        Behavior: Behavior normal. Behavior is cooperative.      GI:  Lab Results: Recent Labs    06/23/20 1009  WBC 6.5  HGB 11.1*  HCT 37.9  PLT 278   BMET Recent Labs    06/23/20 1009  NA 140  K 4.0  CL 102  CO2 25  GLUCOSE 168*  BUN 22  CREATININE 1.04*  CALCIUM 9.4   LFT Recent Labs    06/23/20 1009  PROT 7.8  ALBUMIN 3.3*  AST 21  ALT 11  ALKPHOS 56  BILITOT 0.4   PT/INR No results for input(s): LABPROT, INR in the last 72 hours.   Studies/Results: CT ABDOMEN PELVIS W CONTRAST  Result Date: 06/23/2020 CLINICAL DATA:  Vomiting and chest burning since Tuesday. Bowel obstruction suspected. EXAM: CT ABDOMEN AND PELVIS WITH CONTRAST TECHNIQUE: Multidetector CT imaging of the abdomen and pelvis was performed using the standard protocol following bolus administration of intravenous contrast. CONTRAST:  151m OMNIPAQUE IOHEXOL 300 MG/ML  SOLN COMPARISON:  12/27/2016 FINDINGS: Lower chest: Bibasilar collapse/consolidation noted, right greater than left. Hepatobiliary: Multiple hepatic cysts again noted. Layering tiny calcified gallstones evident. No intrahepatic or extrahepatic biliary dilation. Pancreas: No focal mass lesion. No dilatation of the main duct. No intraparenchymal cyst. No  peripancreatic edema. Spleen: No splenomegaly. No focal mass lesion. Adrenals/Urinary Tract: No adrenal nodule or mass. Stable appearance calcified complex lesion in the lateral interpolar right kidney measuring 2.5 x 1.6 cm today. Multiple hypoattenuating lesions are seen in both kidneys, too small to characterize but likely benign. No overtly suspicious gross enhancing mass lesion in either kidney. No evidence for hydroureter.  Bladder is moderately distended. Stomach/Bowel: Moderate hiatal hernia. Food and debris are seen in the hernia above the hiatus. Stomach distal to the hiatus is decompressed. Duodenum is normally positioned as is the ligament of Treitz. No small bowel wall thickening. No small bowel dilatation. The terminal ileum is normal. The appendix is not visualized, but there is no edema or inflammation in the region of the cecum. No gross colonic mass. No colonic wall thickening. Diverticular changes are noted in the left colon without evidence of diverticulitis. Vascular/Lymphatic: There is abdominal aortic atherosclerosis without aneurysm. There is no gastrohepatic or hepatoduodenal ligament lymphadenopathy. No retroperitoneal or mesenteric lymphadenopathy. No pelvic sidewall lymphadenopathy. Reproductive: The uterus is surgically absent. There is no adnexal mass. Other: No intraperitoneal free fluid. Musculoskeletal: No worrisome lytic or sclerotic osseous abnormality. Old left pubic rami fractures evident. Superior endplate compression deformity noted at L1, age indeterminate, but new since chest CT 02/27/2018. IMPRESSION: 1. Moderate hiatal hernia with fluid and debris in the hernia above the hiatus. Stomach is decompressed distal to the hiatus. As such, gastric obstruction at the level of the diaphragmatic hiatus cannot be excluded in this patient with a history of vomiting. 2. No acute findings in the abdomen/pelvis. Specifically, no evidence for small bowel obstruction. 3. Bibasilar collapse/consolidation, right greater than left. Multifocal pneumonia a consideration. 4. Cholelithiasis. 5. Left colonic diverticulosis without diverticulitis. 6. Aortic Atherosclerosis (ICD10-I70.0). Electronically Signed   By: Misty Stanley M.D.   On: 06/23/2020 14:13   DG Chest Portable 1 View  Result Date: 06/23/2020 CLINICAL DATA:  Chest pain with 3 day history of weakness. EXAM: PORTABLE CHEST 1 VIEW COMPARISON:  10/06/2018  FINDINGS: 1021 hours. Cardiopericardial silhouette is at upper limits of normal for size. Interstitial markings are diffusely coarsened with chronic features. Similar appearance of streaky basilar opacity bilaterally, likely chronic atelectasis or scarring. Interval development of subtle bibasilar airspace disease without substantial pleural effusion. Bones are diffusely demineralized. Telemetry leads overlie the chest. IMPRESSION: 1. Interval development of subtle bibasilar airspace disease. Bibasilar pneumonia not excluded. 2. Otherwise stable exam. Chronic interstitial changes with bibasilar atelectasis or scarring. Electronically Signed   By: Misty Stanley M.D.   On: 06/23/2020 10:51    Impression/Plan: Suspected esophageal/gastric obstruction x 4 days.  Patient able to handle oral secretions, no acute distress.  CT today revealed Moderate hiatal hernia with fluid and debris in the hernia above the hiatus. Stomach is decompressed distal to the hiatus.  EGD tomorrow.  I thoroughly discussed the procedure with the patient and patient's daughter at bedside to include nature, alternatives, benefits, and risks (including but not limited to bleeding, infection, perforation, anesthesia/cardiac and pulmonary complications).  Patient verbalized understanding gave verbal consent to proceed with EGD  NPO with sips/chips.  Strict NPO after midnight.  Protonix 40 mg IV twice daily.  [facility] will follow.    LOS: 0 days   Salley Slaughter  PA-C 06/23/2020, 4:43 PM  Contact #  [phone]

## 2020-06-23 NOTE — H&P (Addendum)
[name] Admission History and Physical Service Pager: [phone]  Patient name: [name] Medical record number: [id number] Date of birth: [birth year] Age: 86 y.o. Gender: female  Primary Care Provider: [name], PA-C Consultants: GI Code Status: DNR Preferred Emergency Contact: Daughter  Chief Complaint: Dysphagia  Assessment and Plan: [name] is a 86 y.o. female presenting with Dysphagia. PMH is significant Dysphagia Hypertension Anemia Lumbar herniated disc Pulmonary Hypertension Hyperlipidemia GERD Depression History of CVA (cerebrovascular accident) CKD (chronic kidney disease), stage III (HCC) Back pain Diabetes mellitus type 2 CAD (Status post coronary artery stent placement )   Dysphagia 2/2 gastric obstruction above the diaphragmatic hiatus Patient presenting with dysphagia for 4 days.  Unable to tolerate any solids or take oral medications.  Patient reports she has been able to take down small amounts of liquid at a time which is how she is staying hydrated.  Currently hemodynamically stable.  Lungs CTAB.  No abdominal tenderness to palpation.  Abdominal CT oderate hiatal hernia with fluid and debris in the hernia above the hiatus. Stomach is decompressed distal to the hiatus. As such, gastric obstruction at the level of the diaphragmatic hiatus cannot be excluded in this patient with a history of vomiting. .  No concerning findings for esophageal rupture.  Bibasilar collapse/consolidation, right greater than left.  Less concern for pneumonia given no fever or other symptoms of bacterial infection.  EKG and trops reassuring for no MI.  CBC-WNL. lipase 33.  Covid and flu negative.  Of note patient had upper endoscopy performed by Dr. Michail Sermon in 2018 which showed a moderate benign-appearing intrinsic stenosis 30 to 35 cm from the incisors which was 5 cm in length.  Grade C esophagitis, a medium sized hiatal hernia.  GI has seen with plans for  EGD.   -Admit to FPTS, med/surg, attending Dr. Erin Erickson -GI Consulted, plan for EGD tomorrow -Closely monitor BP and fluid status -AM BMP, CBC -Strict n.p.o., initiate IV hydration at three quarters maintenance rate -SCDs -Vital signs q 4hr   Pulmonary hypertension Has not any medications in 4 days.  On at home on Opsumit 10 mg, Adempas 2 mg, Orinterim 0.125 mg.  Spoke with pharmacy who indicated we kidney hold home meds.  Spoke with pharmacy who indicated patient should not have rebound issues if meds held.  On 4L  O2 at home.  Currently saturating greater than 95%. -Hold home meds  -Continue 4L LFNC O2, maintain sats greater than 95% -Monitor hydration status and continue 3/4 maintenance IV fluids - Can consult cardiologist for recs if develops difficulty breathing or other signs of pulmonary hypertension  Hypertension SBP 130s to 160s in ED. at home on Lasix 40 mg,, carvedilol 3.125 mg. -Holding oral home meds -Can start IV metoprolol 2.33m q6hr if becomes hypertensive/tachycardic  CAD S/p stent placement March 2020.  Aspirin 81 mg daily and and Plavix 75 mg daily.  Discussed case with pharmacy and given stent she needs some kind of antiplatelet medication.  The only option is aspirin suppository.  Hopefully she will no longer be n.p.o. tomorrow and able to take her oral meds. -Patient is n.p.o. at this time -Aspirin suppository  Anemia, chronic Hemoglobin 11.1 today.  Takes 40 mg ferrous sulfate daily. -Hold home iron -AM CBC  GERD On Nexium 40 mg and Protonix 40 mg at home. -Hold home medications  Depression On Remeron 30 mg at home. -Hold home Remeron  Lumbar herniated disc/back pain At home takes gabapentin  300 mg daily, Flexeril 10 mg daily, Ultracet 37.5-325 mg 4 times daily.  Patient not currently complaining of pain. -Hold home oral pain medications  Hyperlipidemia LDL in September 2021 was 38.  On atorvastatin 80 mg. -Hold home atorvastatin   FEN/GI:  NPO Prophylaxis: SCD's  Disposition: Med/Surg  History of Present Illness:  [name] is a 86 y.o. female presenting with Dysphagia.  Symptoms started Monday, has been unable to keep anything down since then.  Was taking morning pills when symptoms first started. After this anytime she tried solids it would come back up with vomiting.  Denies any nausea. Was able to tolerate liquids if sips slowly.  Drink water and soda over the past few days.  Has also not been able to take any oral medication for past 4 days.     Review Of Systems: Per HPI with the following additions:   Review of Systems  Constitutional: Negative for chills and fever.  HENT: Negative for congestion.   Respiratory: Negative for cough and shortness of breath.   Gastrointestinal: Negative for diarrhea.  Endocrine: Negative for polyuria.  Genitourinary: Negative for difficulty urinating and dysuria.     Patient Active Problem List   Diagnosis Date Noted  . Unstable angina ([place]) 10/06/2018  . Chest pain 10/06/2018  . Febrile illness   . 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 Pulmonary hypertension, unspecified ([place])   . Status post coronary artery stent placement   . Coronary artery disease involving native coronary artery of native heart with angina pectoris ([place])   . Exertional dyspnea 05/14/2018  . Abnormal CT scan, gastrointestinal tract 12/31/2016  . GI bleed 12/31/2016  . HLD (hyperlipidemia) 12/09/2016  . GERD (gastroesophageal reflux disease) 12/09/2016  . Depression 12/09/2016  . History of CVA (cerebrovascular accident) 12/09/2016  . CKD (chronic kidney disease), stage III ([place]) 12/09/2016  . Back pain 12/09/2016  . Sepsis ([place]) 12/09/2016  . History of oral surgery 12/09/2016  . Osteomyelitis of jaw 12/09/2016  . Diabetes mellitus type 2, uncontrolled ([place]) 12/09/2016  . History of Lumbar herniated disc 08/09/2014  . UTI (lower urinary tract  infection) 04/08/2012  . Pneumonia 04/04/2012  . Acute blood loss anemia 04/01/2012  . Fall at home 03/31/2012  . History of shingles 03/31/2012  . Anemia 03/31/2012  . Femur fracture, left ([place]) 03/31/2012  . Hypertension 03/30/2012    Past Medical History: Past Medical History:  Diagnosis Date  . Anxiety   . Arthritis    osteo; "mostly hands, knees, probably back too" (12/09/2016)  . Chronic lower back pain   . Complication of anesthesia 2011   resp distress -on vent after surgery  . Coronary artery disease   . Depression   . Diabetes ([place])   . Esophageal stricture   . GERD (gastroesophageal reflux disease)   . Headache    out grew them  . High cholesterol   . History of blood transfusion 2011; ?03/2012   "related to ORs" (12/09/2016)  . Hypertension   . Intestinal obstruction ([place])   . Macular degeneration of left eye    dx over 55 yrs ago.....hasn't changed much  . Shingles   . Squamous carcinoma 2013   squamas cell on scalp--took 14 radiation tx--2013  . Stroke [facility]) 1967   Mini stroke;  No lasting deficits  . Type II diabetes mellitus ([place]) dx'd 11/2016    Past Surgical History: Past Surgical History:  Procedure Laterality Date  . ABDOMINAL HYSTERECTOMY    .  APPENDECTOMY    . BACK SURGERY    . CATARACT EXTRACTION, BILATERAL Bilateral   . CHOLECYSTECTOMY    . COLON SURGERY    . COLONOSCOPY    . CORONARY ATHERECTOMY N/A 05/15/2018   Procedure: CORONARY ATHERECTOMY;  Surgeon: Leonie Man, MD;  Location: [facility];  Service: Cardiovascular;  Laterality: N/A;  . CORONARY STENT INTERVENTION N/A 05/15/2018   Procedure: CORONARY STENT INTERVENTION;  Surgeon: Leonie Man, MD;  Location: [facility];  Service: Cardiovascular;  Laterality: N/A;  . ESOPHAGOGASTRODUODENOSCOPY (EGD) WITH ESOPHAGEAL DILATION  "several times"  . ESOPHAGOGASTRODUODENOSCOPY (EGD) WITH PROPOFOL N/A 12/31/2016   Procedure: ESOPHAGOGASTRODUODENOSCOPY (EGD) WITH PROPOFOL;   Surgeon: Wilford Corner, MD;  Location: [facility];  Service: Endoscopy;  Laterality: N/A;  . EYE SURGERY    . FRACTURE SURGERY    . IR FLUORO GUIDE CV LINE RIGHT  01/02/2017  . IR US GUIDE VASC ACCESS RIGHT  01/02/2017  . JOINT REPLACEMENT    . LEFT HEART CATH AND CORONARY ANGIOGRAPHY N/A 10/09/2018   Procedure: LEFT HEART CATH AND CORONARY ANGIOGRAPHY;  Surgeon: Larey Dresser, MD;  Location: [facility];  Service: Cardiovascular;  Laterality: N/A;  . LUMBAR DISC SURGERY  10/25/2014   Right L4-L5 removal of seven free fragments of disk mostly posterolaterally.  Lysis of adhesion.  Microscope/notes 10/26/2014  . LUMBAR LAMINECTOMY/DECOMPRESSION MICRODISCECTOMY Right 08/09/2014   Procedure: Right Lumbar Four to Five Microdiskectomy;  Surgeon: Floyce Stakes, MD;  Location: [facility];  Service: Neurosurgery;  Laterality: Right;  Right L4-5 Microdiskectomy  . ORIF PERIPROSTHETIC FRACTURE  03/31/2012   Procedure: OPEN REDUCTION INTERNAL FIXATION (ORIF) PERIPROSTHETIC FRACTURE;  Surgeon: Mauri Pole, MD;  Location: [facility];  Service: Orthopedics;  Laterality: Left;  Open reduction internal fixation Left distal femur periprosthetic fracture  . RIGHT/LEFT HEART CATH AND CORONARY ANGIOGRAPHY N/A 05/14/2018   Procedure: RIGHT/LEFT HEART CATH AND CORONARY ANGIOGRAPHY;  Surgeon: Larey Dresser, MD;  Location: [facility];  Service: Cardiovascular;  Laterality: N/A;  . SMALL INTESTINE SURGERY  2011   "really bad blockage; went into respiratory distress and was on ventilator after surgery; [facility]"  . SQUAMOUS CELL CARCINOMA EXCISION  ~ 2012   S/P "cut off her head then 15 radiation txs"   . TOTAL KNEE ARTHROPLASTY Bilateral    bilateral    Social History: Social History   Tobacco Use  . Smoking status: Former Smoker    Packs/day: 0.10    Years: 10.00    Pack years: 1.00    Types: Cigarettes    Quit date: 03/31/1991    Years since quitting: 29.2  . Smokeless  tobacco: Never Used  Vaping Use  . Vaping Use: Never used  Substance Use Topics  . Alcohol use: No  . Drug use: No   Additional social history:  Please also refer to relevant sections of EMR.   Allergies and Medications: Allergies  Allergen Reactions  . Sulfa Antibiotics Swelling    Mouth and tongue swelling   No current facility-administered medications on file prior to encounter.   Current Outpatient Medications on File Prior to Encounter  Medication Sig Dispense Refill  . ALPRAZolam (XANAX) 0.5 MG tablet Take 0.5 mg by mouth daily as needed for anxiety.     Marland Kitchen aspirin EC 81 MG tablet Take 1 tablet (81 mg total) by mouth daily. 30 tablet 11  . atorvastatin (LIPITOR) 80 MG tablet Take 1 tablet (80 mg  total) by mouth daily. 90 tablet 3  . carvedilol (COREG) 3.125 MG tablet TAKE 1 TABLET BY MOUTH TWICE DAILY WITH A MEAL (Patient taking differently: Take 3.125 mg by mouth 2 (two) times daily with a meal. ) 60 tablet 11  . clopidogrel (PLAVIX) 75 MG tablet Take 1 tablet (75 mg total) by mouth daily. 90 tablet 3  . cyclobenzaprine (FLEXERIL) 10 MG tablet Take 10 mg by mouth at bedtime.    Marland Kitchen FERROUS SULFATE PO Take 40 mg by mouth daily.    . furosemide (LASIX) 40 MG tablet Take 1 tablet (40 mg total) by mouth every morning AND 0.5 tablets (20 mg total) every evening. (Patient taking differently: Take 1 tablet (40 mg total) by mouth every morning AND take 0.5 tablets (20 mg total) by mouth every evening.) 45 tablet 5  . gabapentin (NEURONTIN) 300 MG capsule Take 300 mg by mouth 3 (three) times daily.     Marland Kitchen icosapent Ethyl (VASCEPA) 1 g capsule Take 2 capsules (2 g total) by mouth 2 (two) times daily. 120 capsule 11  . lactose free nutrition (BOOST) LIQD Take 237 mLs by mouth 2 (two) times daily between meals.     . mirtazapine (REMERON) 30 MG tablet Take 30 mg by mouth at bedtime.    . OPSUMIT 10 MG tablet Take 1 tablet (10 mg total) by mouth daily. 30 tablet 11  . potassium chloride SA  (KLOR-CON) 20 MEQ tablet TAKE 1 TABLET(20 MEQ) BY MOUTH DAILY (Patient taking differently: Take 20 mEq by mouth daily. ) 30 tablet 6  . Riociguat (ADEMPAS) 2 MG TABS Take 2 mg by mouth 3 (three) times daily. 90 tablet 11  . traMADol-acetaminophen (ULTRACET) 37.5-325 MG tablet Take 2 tablets by mouth 4 (four) times daily as needed for pain.  2  . Treprostinil Diolamine ER (ORENITRAM) 0.125 MG TBCR Take 0.125 mg by mouth 3 (three) times daily. 90 tablet 5  . carvedilol (COREG) 3.125 MG tablet TAKE 1 TABLET BY MOUTH TWICE DAILY WITH A MEAL (Patient not taking: Reported on 06/23/2020) 60 tablet 6  . esomeprazole (NEXIUM) 20 MG packet Take 20 mg by mouth daily before breakfast.    . pantoprazole (PROTONIX) 40 MG tablet Take 40 mg by mouth daily.      Objective: BP 130/64   Pulse 94   Temp 98.6 F (37 C) (Oral)   Resp 17   Ht 5' 2.5" (1.588 m)   Wt 62.6 kg   SpO2 94%   BMI 24.84 kg/m  Exam:  Physical Exam Constitutional:      General: She is not in acute distress.    Appearance: She is not ill-appearing, toxic-appearing or diaphoretic.  HENT:     Head: Normocephalic and atraumatic.  Cardiovascular:     Rate and Rhythm: Normal rate and regular rhythm.     Pulses:          Radial pulses are 2+ on the right side and 2+ on the left side.       Dorsalis pedis pulses are 1+ on the right side and 1+ on the left side.     Heart sounds: Normal heart sounds.  Pulmonary:     Effort: Pulmonary effort is normal. No respiratory distress.     Breath sounds: Normal breath sounds.  Abdominal:     General: There is no abdominal bruit.     Palpations: Abdomen is soft.     Comments: Decreased bowel sounds.  Musculoskeletal:  Right lower leg: No edema.     Left lower leg: No edema.  Skin:    General: Skin is warm.     Capillary Refill: Capillary refill takes less than 2 seconds.  Neurological:     General: No focal deficit present.     Mental Status: She is alert and oriented to person,  place, and time.  Psychiatric:        Mood and Affect: Mood normal.        Behavior: Behavior normal.     Labs and Imaging: CBC BMET  Recent Labs  Lab 06/23/20 1009  WBC 6.5  HGB 11.1*  HCT 37.9  PLT 278   Recent Labs  Lab 06/23/20 1009  NA 140  K 4.0  CL 102  CO2 25  BUN 22  CREATININE 1.04*  GLUCOSE 168*  CALCIUM 9.4     EKG: Normal sinus rhythm  CT ABDOMEN PELVIS W CONTRAST  Result Date: 06/23/2020 CLINICAL DATA:  Vomiting and chest burning since Tuesday. Bowel obstruction suspected. EXAM: CT ABDOMEN AND PELVIS WITH CONTRAST TECHNIQUE: Multidetector CT imaging of the abdomen and pelvis was performed using the standard protocol following bolus administration of intravenous contrast. CONTRAST:  114m OMNIPAQUE IOHEXOL 300 MG/ML  SOLN COMPARISON:  12/27/2016 FINDINGS: Lower chest: Bibasilar collapse/consolidation noted, right greater than left. Hepatobiliary: Multiple hepatic cysts again noted. Layering tiny calcified gallstones evident. No intrahepatic or extrahepatic biliary dilation. Pancreas: No focal mass lesion. No dilatation of the main duct. No intraparenchymal cyst. No peripancreatic edema. Spleen: No splenomegaly. No focal mass lesion. Adrenals/Urinary Tract: No adrenal nodule or mass. Stable appearance calcified complex lesion in the lateral interpolar right kidney measuring 2.5 x 1.6 cm today. Multiple hypoattenuating lesions are seen in both kidneys, too small to characterize but likely benign. No overtly suspicious gross enhancing mass lesion in either kidney. No evidence for hydroureter. Bladder is moderately distended. Stomach/Bowel: Moderate hiatal hernia. Food and debris are seen in the hernia above the hiatus. Stomach distal to the hiatus is decompressed. Duodenum is normally positioned as is the ligament of Treitz. No small bowel wall thickening. No small bowel dilatation. The terminal ileum is normal. The appendix is not visualized, but there is no edema or  inflammation in the region of the cecum. No gross colonic mass. No colonic wall thickening. Diverticular changes are noted in the left colon without evidence of diverticulitis. Vascular/Lymphatic: There is abdominal aortic atherosclerosis without aneurysm. There is no gastrohepatic or hepatoduodenal ligament lymphadenopathy. No retroperitoneal or mesenteric lymphadenopathy. No pelvic sidewall lymphadenopathy. Reproductive: The uterus is surgically absent. There is no adnexal mass. Other: No intraperitoneal free fluid. Musculoskeletal: No worrisome lytic or sclerotic osseous abnormality. Old left pubic rami fractures evident. Superior endplate compression deformity noted at L1, age indeterminate, but new since chest CT 02/27/2018. IMPRESSION: 1. Moderate hiatal hernia with fluid and debris in the hernia above the hiatus. Stomach is decompressed distal to the hiatus. As such, gastric obstruction at the level of the diaphragmatic hiatus cannot be excluded in this patient with a history of vomiting. 2. No acute findings in the abdomen/pelvis. Specifically, no evidence for small bowel obstruction. 3. Bibasilar collapse/consolidation, right greater than left. Multifocal pneumonia a consideration. 4. Cholelithiasis. 5. Left colonic diverticulosis without diverticulitis. 6. Aortic Atherosclerosis (ICD10-I70.0). Electronically Signed   By: EMisty StanleyM.D.   On: 06/23/2020 14:13   DG Chest Portable 1 View  Result Date: 06/23/2020 CLINICAL DATA:  Chest pain with 3 day history of weakness. EXAM: PORTABLE  CHEST 1 VIEW COMPARISON:  10/06/2018 FINDINGS: 1021 hours. Cardiopericardial silhouette is at upper limits of normal for size. Interstitial markings are diffusely coarsened with chronic features. Similar appearance of streaky basilar opacity bilaterally, likely chronic atelectasis or scarring. Interval development of subtle bibasilar airspace disease without substantial pleural effusion. Bones are diffusely  demineralized. Telemetry leads overlie the chest. IMPRESSION: 1. Interval development of subtle bibasilar airspace disease. Bibasilar pneumonia not excluded. 2. Otherwise stable exam. Chronic interstitial changes with bibasilar atelectasis or scarring. Electronically Signed   By: Misty Stanley M.D.   On: 06/23/2020 10:51    Delora Fuel, MD 06/23/2020, 2:56 PM PGY-1, [facility] pager: [phone], text pages welcome  FPTS Upper-Level Resident Addendum   I have independently interviewed and examined the patient. I have discussed the above with the original author and agree with their documentation. My edits for correction/addition/clarification are in blue.Please see also any attending notes.   Gifford Shave, MD PGY-2, [facility] 06/23/2020 7:42 PM  [facility] Service pager: [phone] (text pages welcome through [facility])

## 2020-06-23 NOTE — ED Triage Notes (Signed)
Pt from home with [facility] for c.o emesis and chest burning since Tuesday. 1 episode of emesis with ems. Denies diarrhea today but did have a few episodes on Monday and Tuesday. Pt a.o, denies abd pain.   BP 176/80 HR90 95% on 4L (always wears) CBG 184

## 2020-06-23 NOTE — ED Provider Notes (Signed)
[facility] EMERGENCY DEPARTMENT Provider Note   CSN: [id number] Arrival date & time: 06/23/20  4332     History Chief Complaint  Patient presents with  . Emesis  . Chest Pain    [name] is a 86 y.o. female with history of coronary artery disease status post bifurcation stenting, diabetes, high cholesterol, hypertension, pulmonary hypertension on 4 L O2 therapy Chronically, bowel obstruction, presented to emergency department with complaint of dysphagia and chest pain.  The patient reports that she had a sensation of chest discomfort after taking her pills 4 days ago on Monday.  She says the sensation has waxed and waned for the past 4 days, but she finds that she cannot swallow her pills or swallow any solid food, because it feels like a "get stuck" and then she wants to vomit.  She is not having any difficulty with swallowing water.  She reports she had a regular bowel movement 2 days ago, but has not passed gas since then.  She denies abdominal pain.  She does have a feeling of chest discomfort and pressure, but says it does not feel like her prior MIs.  EMS gave her 4 aspirin in route to the hospital, but she feels like she choked on this.  She does report a distant history of a bowel obstruction, but does not feel like her symptoms are similar.  She denies any history of abdominal surgeries.  Last upper endoscopy on 12/31/16, impression - Benign-appearing esophageal stenosis. - LA Grade C reflux esophagitis. - Medium-sized hiatal hernia. - Multiple non-bleeding duodenal ulcers with pigmented material. - Normal second portion of the duodenum. - No specimens collected.  10/09/18 LHC: Left Main  No significant disease.  Left Anterior Descending  Patent bifurcation stents in proximal and D1. 40% mid LAD stenosis at D2.  Left Circumflex  Luminal irregularities.  Right Coronary Artery  30% mid RCA stenosis. 30% proximal PDA stenosis.   10/12/19  Echo:  IMPRESSIONS    1. Left ventricular ejection fraction, by estimation, is 55 to 60%. The  left ventricle has normal function. The left ventricle has no regional  wall motion abnormalities. Left ventricular diastolic parameters are  indeterminate.  2. Right ventricular systolic function is moderately reduced. The right  ventricular size is moderately enlarged. There is mildly elevated  pulmonary artery systolic pressure.  3. The mitral valve is normal in structure. No evidence of mitral valve  regurgitation. No evidence of mitral stenosis.  4. The aortic valve is normal in structure. Aortic valve regurgitation is  trivial. No aortic stenosis is present.  5. The inferior vena cava is normal in size with greater than 50%  respiratory variability, suggesting right atrial pressure of 3 mmHg.    HPI    Past Medical History:  Diagnosis Date  . Anxiety   . Arthritis    osteo; "mostly hands, knees, probably back too" (12/09/2016)  . Chronic lower back pain   . Complication of anesthesia 2011   resp distress -on vent after surgery  . Coronary artery disease   . Depression   . Diabetes ([place])   . Esophageal stricture   . GERD (gastroesophageal reflux disease)   . Headache    out grew them  . High cholesterol   . History of blood transfusion 2011; ?03/2012   "related to ORs" (12/09/2016)  . Hypertension   . Intestinal obstruction ([place])   . Macular degeneration of left eye    dx over 55 yrs  ago.Marland KitchenMarland Kitchen..hasn't changed much  . Shingles   . Squamous carcinoma 2013   squamas cell on scalp--took 14 radiation tx--2013  . Stroke [facility]) 1967   Mini stroke;  No lasting deficits  . Type II diabetes mellitus ([place]) dx'd 11/2016    Patient Active Problem List   Diagnosis Date Noted  . Dysphagia 06/23/2020  . Unstable angina ([place]) 10/06/2018  . Chest pain 10/06/2018  . Febrile illness   . 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 Pulmonary  hypertension, unspecified ([place])   . Status post coronary artery stent placement   . Coronary artery disease involving native coronary artery of native heart with angina pectoris ([place])   . Exertional dyspnea 05/14/2018  . Abnormal CT scan, gastrointestinal tract 12/31/2016  . GI bleed 12/31/2016  . HLD (hyperlipidemia) 12/09/2016  . GERD (gastroesophageal reflux disease) 12/09/2016  . Depression 12/09/2016  . History of CVA (cerebrovascular accident) 12/09/2016  . CKD (chronic kidney disease), stage III ([place]) 12/09/2016  . Back pain 12/09/2016  . Sepsis ([place]) 12/09/2016  . History of oral surgery 12/09/2016  . Osteomyelitis of jaw 12/09/2016  . Diabetes mellitus type 2, uncontrolled ([place]) 12/09/2016  . History of Lumbar herniated disc 08/09/2014  . UTI (lower urinary tract infection) 04/08/2012  . Pneumonia 04/04/2012  . Acute blood loss anemia 04/01/2012  . Fall at home 03/31/2012  . History of shingles 03/31/2012  . Anemia 03/31/2012  . Femur fracture, left ([place]) 03/31/2012  . Hypertension 03/30/2012    Past Surgical History:  Procedure Laterality Date  . ABDOMINAL HYSTERECTOMY    . APPENDECTOMY    . BACK SURGERY    . CATARACT EXTRACTION, BILATERAL Bilateral   . CHOLECYSTECTOMY    . COLON SURGERY    . COLONOSCOPY    . CORONARY ATHERECTOMY N/A 05/15/2018   Procedure: CORONARY ATHERECTOMY;  Surgeon: Leonie Man, MD;  Location: [place] [facility];  Service: Cardiovascular;  Laterality: N/A;  . CORONARY STENT INTERVENTION N/A 05/15/2018   Procedure: CORONARY STENT INTERVENTION;  Surgeon: Leonie Man, MD;  Location: [place] [facility];  Service: Cardiovascular;  Laterality: N/A;  . ESOPHAGOGASTRODUODENOSCOPY (EGD) WITH ESOPHAGEAL DILATION  "several times"  . ESOPHAGOGASTRODUODENOSCOPY (EGD) WITH PROPOFOL N/A 12/31/2016   Procedure: ESOPHAGOGASTRODUODENOSCOPY (EGD) WITH PROPOFOL;  Surgeon: Wilford Corner, MD;  Location: [place];  Service: Endoscopy;  Laterality:  N/A;  . EYE SURGERY    . FRACTURE SURGERY    . IR FLUORO GUIDE CV LINE RIGHT  01/02/2017  . IR US GUIDE VASC ACCESS RIGHT  01/02/2017  . JOINT REPLACEMENT    . LEFT HEART CATH AND CORONARY ANGIOGRAPHY N/A 10/09/2018   Procedure: LEFT HEART CATH AND CORONARY ANGIOGRAPHY;  Surgeon: Larey Dresser, MD;  Location: [place] [facility];  Service: Cardiovascular;  Laterality: N/A;  . LUMBAR DISC SURGERY  10/25/2014   Right L4-L5 removal of seven free fragments of disk mostly posterolaterally.  Lysis of adhesion.  Microscope/notes 10/26/2014  . LUMBAR LAMINECTOMY/DECOMPRESSION MICRODISCECTOMY Right 08/09/2014   Procedure: Right Lumbar Four to Five Microdiskectomy;  Surgeon: Floyce Stakes, MD;  Location: [facility];  Service: Neurosurgery;  Laterality: Right;  Right L4-5 Microdiskectomy  . ORIF PERIPROSTHETIC FRACTURE  03/31/2012   Procedure: OPEN REDUCTION INTERNAL FIXATION (ORIF) PERIPROSTHETIC FRACTURE;  Surgeon: Mauri Pole, MD;  Location: [facility];  Service: Orthopedics;  Laterality: Left;  Open reduction internal fixation Left distal femur periprosthetic fracture  . RIGHT/LEFT HEART CATH AND CORONARY ANGIOGRAPHY N/A 05/14/2018  Procedure: RIGHT/LEFT HEART CATH AND CORONARY ANGIOGRAPHY;  Surgeon: Larey Dresser, MD;  Location: [place] [facility];  Service: Cardiovascular;  Laterality: N/A;  . SMALL INTESTINE SURGERY  2011   "really bad blockage; went into respiratory distress and was on ventilator after surgery; [facility]"  . SQUAMOUS CELL CARCINOMA EXCISION  ~ 2012   S/P "cut off her head then 15 radiation txs"   . TOTAL KNEE ARTHROPLASTY Bilateral    bilateral     OB History   No obstetric history on file.     No family history on file.  Social History   Tobacco Use  . Smoking status: Former Smoker    Packs/day: 0.10    Years: 10.00    Pack years: 1.00    Types: Cigarettes    Quit date: 03/31/1991    Years since quitting: 29.2  . Smokeless tobacco: Never Used  Vaping  Use  . Vaping Use: Never used  Substance Use Topics  . Alcohol use: No  . Drug use: No    Home Medications Prior to Admission medications   Medication Sig Start Date End Date Taking? Authorizing Provider  ALPRAZolam Duanne Moron) 0.5 MG tablet Take 0.5 mg by mouth daily as needed for anxiety.  09/14/19  Yes [provider]  aspirin EC 81 MG tablet Take 1 tablet (81 mg total) by mouth daily. 05/16/18  Yes Bhagat, Bhavinkumar, PA  atorvastatin (LIPITOR) 80 MG tablet Take 1 tablet (80 mg total) by mouth daily. 11/25/19  Yes Larey Dresser, MD  carvedilol (COREG) 3.125 MG tablet TAKE 1 TABLET BY MOUTH TWICE DAILY WITH A MEAL Patient taking differently: Take 3.125 mg by mouth 2 (two) times daily with a meal.  05/08/20  Yes Larey Dresser, MD  clopidogrel (PLAVIX) 75 MG tablet Take 1 tablet (75 mg total) by mouth daily. 11/25/19  Yes Larey Dresser, MD  cyclobenzaprine (FLEXERIL) 10 MG tablet Take 10 mg by mouth at bedtime.   Yes [provider]  esomeprazole (NEXIUM) 40 MG capsule Take 40 mg by mouth daily before breakfast.    Yes [provider]  FERROUS SULFATE PO Take 40 mg by mouth daily.   Yes [provider]  furosemide (LASIX) 40 MG tablet Take 1 tablet (40 mg total) by mouth every morning AND 0.5 tablets (20 mg total) every evening. Patient taking differently: Take 1 tablet (40 mg total) by mouth every morning AND take 0.5 tablets (20 mg total) by mouth every evening. 12/22/19  Yes Larey Dresser, MD  gabapentin (NEURONTIN) 300 MG capsule Take 300 mg by mouth 3 (three) times daily.    Yes [provider]  icosapent Ethyl (VASCEPA) 1 g capsule Take 2 capsules (2 g total) by mouth 2 (two) times daily. 04/13/20  Yes Larey Dresser, MD  lactose free nutrition (BOOST) LIQD Take 237 mLs by mouth 2 (two) times daily between meals.    Yes [provider]  mirtazapine (REMERON) 30 MG tablet Take 30 mg by mouth at bedtime.   Yes [provider]  OPSUMIT 10 MG tablet Take 1 tablet (10 mg total) by mouth daily. 10/14/19  Yes Larey Dresser, MD  potassium chloride SA (KLOR-CON) 20 MEQ tablet TAKE 1 TABLET(20 MEQ) BY MOUTH DAILY Patient taking differently: Take 20 mEq by mouth daily.  02/11/20  Yes Larey Dresser, MD  Riociguat (ADEMPAS) 2 MG TABS Take 2 mg by mouth 3 (three) times daily. 10/22/19  Yes  Larey Dresser, MD  traMADol-acetaminophen (ULTRACET) 37.5-325 MG tablet Take 2 tablets by mouth 4 (four) times daily as needed for pain. 05/07/18  Yes [provider]  Treprostinil Diolamine ER (ORENITRAM) 0.125 MG TBCR Take 0.125 mg by mouth 3 (three) times daily. 01/24/20  Yes Larey Dresser, MD  carvedilol (COREG) 3.125 MG tablet TAKE 1 TABLET BY MOUTH TWICE DAILY WITH A MEAL Patient not taking: Reported on 06/23/2020 05/08/20   Larey Dresser, MD    Allergies    Sulfa antibiotics  Review of Systems   Review of Systems  Constitutional: Negative for chills and fever.  HENT: Negative for ear pain and sore throat.   Eyes: Negative for photophobia and visual disturbance.  Respiratory: Negative for cough and shortness of breath.   Cardiovascular: Positive for chest pain. Negative for palpitations.  Gastrointestinal: Positive for nausea. Negative for abdominal pain and vomiting.  Genitourinary: Negative for dysuria and hematuria.  Musculoskeletal: Negative for arthralgias and back pain.  Skin: Negative for rash and wound.  Neurological: Negative for syncope and headaches.  All other systems reviewed and are negative.   Physical Exam Updated Vital Signs BP (!) 141/66   Pulse 98   Temp 98.6 F (37 C) (Oral)   Resp 17   Ht 5' 2.5" (1.588 m)   Wt 62.6 kg   SpO2 97%   BMI 24.84 kg/m   Physical Exam Vitals and nursing note reviewed.  Constitutional:      General: She is not in acute distress.    Appearance: She is well-developed.  HENT:     Head: Normocephalic and atraumatic.  Eyes:     Conjunctiva/sclera:  Conjunctivae normal.  Cardiovascular:     Rate and Rhythm: Normal rate and regular rhythm.     Heart sounds: Normal heart sounds.  Pulmonary:     Effort: Pulmonary effort is normal. No respiratory distress.     Comments: On 4L [place] baseline Abdominal:     Palpations: Abdomen is soft.     Tenderness: There is no abdominal tenderness.  Musculoskeletal:     Cervical back: Neck supple.  Skin:    General: Skin is warm and dry.  Neurological:     General: No focal deficit present.     Mental Status: She is alert and oriented to person, place, and time.  Psychiatric:        Mood and Affect: Mood normal.        Behavior: Behavior normal.     ED Results / Procedures / Treatments   Labs (all labs ordered are listed, but only abnormal results are displayed) Labs Reviewed  COMPREHENSIVE METABOLIC PANEL - Abnormal; Notable for the following components:      Result Value   Glucose, Bld 168 (*)    Creatinine, Ser 1.04 (*)    Albumin 3.3 (*)    GFR, Estimated 52 (*)    All other components within normal limits  CBC WITH DIFFERENTIAL/PLATELET - Abnormal; Notable for the following components:   Hemoglobin 11.1 (*)    MCHC 29.3 (*)    All other components within normal limits  TROPONIN I (HIGH SENSITIVITY) - Abnormal; Notable for the following components:   Troponin I (High Sensitivity) 23 (*)    All other components within normal limits  TROPONIN I (HIGH SENSITIVITY) - Abnormal; Notable for the following components:   Troponin I (High Sensitivity) 27 (*)    All other components within normal limits  RESPIRATORY PANEL BY RT PCR (  FLU A&B, COVID)  LIPASE, BLOOD  MAGNESIUM  URINALYSIS, ROUTINE W REFLEX MICROSCOPIC    EKG EKG Interpretation  Date/Time:  Friday June 23 2020 09:46:56 EST Ventricular Rate:  98 PR Interval:    QRS Duration: 137 QT Interval:  388 QTC Calculation: 496 R Axis:   110 Text Interpretation: Sinus tachycardia T wave pattern noted on prior Feb 2021 ecg RBBB  noted on Feb 2021 ecg No significant changes, no STEMI Confirmed by Octaviano Glow [phone]) on 06/23/2020 9:56:32 AM   Radiology CT ABDOMEN PELVIS W CONTRAST  Result Date: 06/23/2020 CLINICAL DATA:  Vomiting and chest burning since Tuesday. Bowel obstruction suspected. EXAM: CT ABDOMEN AND PELVIS WITH CONTRAST TECHNIQUE: Multidetector CT imaging of the abdomen and pelvis was performed using the standard protocol following bolus administration of intravenous contrast. CONTRAST:  167m OMNIPAQUE IOHEXOL 300 MG/ML  SOLN COMPARISON:  12/27/2016 FINDINGS: Lower chest: Bibasilar collapse/consolidation noted, right greater than left. Hepatobiliary: Multiple hepatic cysts again noted. Layering tiny calcified gallstones evident. No intrahepatic or extrahepatic biliary dilation. Pancreas: No focal mass lesion. No dilatation of the main duct. No intraparenchymal cyst. No peripancreatic edema. Spleen: No splenomegaly. No focal mass lesion. Adrenals/Urinary Tract: No adrenal nodule or mass. Stable appearance calcified complex lesion in the lateral interpolar right kidney measuring 2.5 x 1.6 cm today. Multiple hypoattenuating lesions are seen in both kidneys, too small to characterize but likely benign. No overtly suspicious gross enhancing mass lesion in either kidney. No evidence for hydroureter. Bladder is moderately distended. Stomach/Bowel: Moderate hiatal hernia. Food and debris are seen in the hernia above the hiatus. Stomach distal to the hiatus is decompressed. Duodenum is normally positioned as is the ligament of Treitz. No small bowel wall thickening. No small bowel dilatation. The terminal ileum is normal. The appendix is not visualized, but there is no edema or inflammation in the region of the cecum. No gross colonic mass. No colonic wall thickening. Diverticular changes are noted in the left colon without evidence of diverticulitis. Vascular/Lymphatic: There is abdominal aortic atherosclerosis without  aneurysm. There is no gastrohepatic or hepatoduodenal ligament lymphadenopathy. No retroperitoneal or mesenteric lymphadenopathy. No pelvic sidewall lymphadenopathy. Reproductive: The uterus is surgically absent. There is no adnexal mass. Other: No intraperitoneal free fluid. Musculoskeletal: No worrisome lytic or sclerotic osseous abnormality. Old left pubic rami fractures evident. Superior endplate compression deformity noted at L1, age indeterminate, but new since chest CT 02/27/2018. IMPRESSION: 1. Moderate hiatal hernia with fluid and debris in the hernia above the hiatus. Stomach is decompressed distal to the hiatus. As such, gastric obstruction at the level of the diaphragmatic hiatus cannot be excluded in this patient with a history of vomiting. 2. No acute findings in the abdomen/pelvis. Specifically, no evidence for small bowel obstruction. 3. Bibasilar collapse/consolidation, right greater than left. Multifocal pneumonia a consideration. 4. Cholelithiasis. 5. Left colonic diverticulosis without diverticulitis. 6. Aortic Atherosclerosis (ICD10-I70.0). Electronically Signed   By: EMisty StanleyM.D.   On: 06/23/2020 14:13   DG Chest Portable 1 View  Result Date: 06/23/2020 CLINICAL DATA:  Chest pain with 3 day history of weakness. EXAM: PORTABLE CHEST 1 VIEW COMPARISON:  10/06/2018 FINDINGS: 1021 hours. Cardiopericardial silhouette is at upper limits of normal for size. Interstitial markings are diffusely coarsened with chronic features. Similar appearance of streaky basilar opacity bilaterally, likely chronic atelectasis or scarring. Interval development of subtle bibasilar airspace disease without substantial pleural effusion. Bones are diffusely demineralized. Telemetry leads overlie the chest. IMPRESSION: 1. Interval development of subtle bibasilar airspace  disease. Bibasilar pneumonia not excluded. 2. Otherwise stable exam. Chronic interstitial changes with bibasilar atelectasis or scarring.  Electronically Signed   By: Misty Stanley M.D.   On: 06/23/2020 10:51    Procedures Procedures (including critical care time)  Medications Ordered in ED Medications  0.9 %  sodium chloride infusion (has no administration in time range)  pantoprazole (PROTONIX) injection 40 mg (has no administration in time range)  sodium chloride 0.9 % bolus 500 mL (0 mLs Intravenous Stopped 06/23/20 1217)  iohexol (OMNIPAQUE) 300 MG/ML solution 100 mL (100 mLs Intravenous Contrast Given 06/23/20 1341)    ED Course  I have reviewed the triage vital signs and the nursing notes.  Pertinent labs & imaging results that were available during my care of the patient were reviewed by me and considered in my medical decision making (see chart for details).  This patient complains of dysphagia and chest discomfort .  This involves an extensive number of treatment options, and is a complaint that carries with it a high risk of complications and morbidity.  The differential diagnosis includes ACS vs food bolus/impaction vs pill esophagitis vs gastritis vs peptic ulcer vs bowel obstuction vs other  She has some esophageal stenosis on 2018, may be contributing to this if there is an impaction.  I ordered, reviewed, and interpreted labs, which included trop 23 -> 27, Flu/Covid negative, CMP and CBC largely unremarkable. I ordered imaging studies which included DG chest and CT abdomen/pelvis with IV contrast I independently visualized and interpreted imaging which showed likely bibasilar atelectasis and food debris above hiatus of hernia and the monitor tracing which showed sinus rhythm  Previous records obtained and reviewed showing cardiology outpatient w/u and cath report from 2020, EGD report from 2018 I consulted GI and discussed lab and imaging findings  *  Update - from scans this is likely a complication of her hiatal hernia, with food impaction. Patient handling secretions here, but she hasn't been able to  eat for the past 4 days.  Pending medical admission and GI evaluation   Clinical Course as of Jun 23 1658  Fri Jun 23, 2020  1341 Repeat trop stable 23 -> 27   [MT]  1432  IMPRESSION: 1. Moderate hiatal hernia with fluid and debris in the hernia above the hiatus. Stomach is decompressed distal to the hiatus. As such, gastric obstruction at the level of the diaphragmatic hiatus cannot be excluded in this patient with a history of vomiting. 2. No acute findings in the abdomen/pelvis. Specifically, no evidence for small bowel obstruction. 3. Bibasilar collapse/consolidation, right greater than left. Multifocal pneumonia a consideration. 4. Cholelithiasis. 5. Left colonic diverticulosis without diverticulitis. 6. Aortic Atherosclerosis (ICD10-I70.0).    [MT]  [place] [facility] Pamalee Leyden GI added to treatment team, will admit to hospitalist for food impaction   [MT]  [facility] to see and admit   [MT]    Clinical Course User Index [MT] Kinsey Karch, Carola Rhine, MD    Final Clinical Impression(s) / ED Diagnoses Final diagnoses:  Hiatal hernia    Rx / DC Orders ED Discharge Orders    None       Wyvonnia Dusky, MD 06/23/20 1659

## 2020-06-23 NOTE — Progress Notes (Signed)
Patient's daughter called and informed procedure is scheduled at 9:00am per patient request.

## 2020-06-24 ENCOUNTER — Encounter (HOSPITAL_COMMUNITY)
Admission: EM | Disposition: A | Discharge status: Home / Self Care | Payer: Self-pay | Source: Home / Self Care | Attending: Emergency Medicine

## 2020-06-24 ENCOUNTER — Observation Stay (HOSPITAL_COMMUNITY): Payer: Medicare Other | Admitting: Anesthesiology

## 2020-06-24 ENCOUNTER — Encounter (HOSPITAL_COMMUNITY): Payer: Self-pay | Admitting: Family Medicine

## 2020-06-24 DIAGNOSIS — K209 Esophagitis, unspecified without bleeding: Secondary | ICD-10-CM | POA: Diagnosis not present

## 2020-06-24 DIAGNOSIS — K222 Esophageal obstruction: Secondary | ICD-10-CM | POA: Diagnosis not present

## 2020-06-24 DIAGNOSIS — R1310 Dysphagia, unspecified: Secondary | ICD-10-CM | POA: Diagnosis not present

## 2020-06-24 DIAGNOSIS — K2100 Gastro-esophageal reflux disease with esophagitis, without bleeding: Secondary | ICD-10-CM | POA: Diagnosis not present

## 2020-06-24 DIAGNOSIS — K449 Diaphragmatic hernia without obstruction or gangrene: Secondary | ICD-10-CM | POA: Diagnosis not present

## 2020-06-24 DIAGNOSIS — D62 Acute posthemorrhagic anemia: Secondary | ICD-10-CM | POA: Diagnosis not present

## 2020-06-24 DIAGNOSIS — Z20822 Contact with and (suspected) exposure to covid-19: Secondary | ICD-10-CM | POA: Diagnosis not present

## 2020-06-24 DIAGNOSIS — R933 Abnormal findings on diagnostic imaging of other parts of digestive tract: Secondary | ICD-10-CM | POA: Diagnosis not present

## 2020-06-24 HISTORY — PX: BALLOON DILATION: SHX5330

## 2020-06-24 HISTORY — PX: ESOPHAGOGASTRODUODENOSCOPY: SHX5428

## 2020-06-24 LAB — URINALYSIS, ROUTINE W REFLEX MICROSCOPIC
Bilirubin Urine: NEGATIVE
Glucose, UA: NEGATIVE mg/dL
Hgb urine dipstick: NEGATIVE
Ketones, ur: 5 mg/dL — AB
Leukocytes,Ua: NEGATIVE
Nitrite: NEGATIVE
Protein, ur: NEGATIVE mg/dL
Specific Gravity, Urine: 1.021 (ref 1.005–1.030)
pH: 6 (ref 5.0–8.0)

## 2020-06-24 LAB — CBC
HCT: 33.2 % — ABNORMAL LOW (ref 36.0–46.0)
Hemoglobin: 9.8 g/dL — ABNORMAL LOW (ref 12.0–15.0)
MCH: 26.8 pg (ref 26.0–34.0)
MCHC: 29.5 g/dL — ABNORMAL LOW (ref 30.0–36.0)
MCV: 91 fL (ref 80.0–100.0)
Platelets: 244 10*3/uL (ref 150–400)
RBC: 3.65 MIL/uL — ABNORMAL LOW (ref 3.87–5.11)
RDW: 14.9 % (ref 11.5–15.5)
WBC: 6.5 10*3/uL (ref 4.0–10.5)
nRBC: 0 % (ref 0.0–0.2)

## 2020-06-24 LAB — BASIC METABOLIC PANEL
Anion gap: 12 (ref 5–15)
BUN: 16 mg/dL (ref 8–23)
CO2: 24 mmol/L (ref 22–32)
Calcium: 8.9 mg/dL (ref 8.9–10.3)
Chloride: 109 mmol/L (ref 98–111)
Creatinine, Ser: 0.84 mg/dL (ref 0.44–1.00)
GFR, Estimated: >60 mL/min (ref >60)
Glucose, Bld: 118 mg/dL — ABNORMAL HIGH (ref 70–99)
Potassium: 3.9 mmol/L (ref 3.5–5.1)
Sodium: 145 mmol/L (ref 135–145)

## 2020-06-24 SURGERY — EGD (ESOPHAGOGASTRODUODENOSCOPY)
Anesthesia: Monitor Anesthesia Care

## 2020-06-24 MED ORDER — SODIUM CHLORIDE 0.9 % IV SOLN: INTRAVENOUS | Status: DC

## 2020-06-24 MED ORDER — PANTOPRAZOLE SODIUM 40 MG PO TBEC: 40.0000 mg | DELAYED_RELEASE_TABLET | Freq: Two times a day (BID) | ORAL | 0 refills | Status: DC

## 2020-06-24 MED ORDER — PROPOFOL 10 MG/ML IV BOLUS
INTRAVENOUS | Status: DC | PRN
  Administered 2020-06-24 (×5): 20 mg via INTRAVENOUS

## 2020-06-24 MED ORDER — PROPOFOL 500 MG/50ML IV EMUL
INTRAVENOUS | Status: DC | PRN
  Administered 2020-06-24: 100 ug/kg/min via INTRAVENOUS

## 2020-06-24 MED ORDER — CLOPIDOGREL BISULFATE 75 MG PO TABS
75.0000 mg | ORAL_TABLET | Freq: Every day | ORAL | 3 refills | Status: DC
Start: 2020-06-26 — End: 2020-12-06

## 2020-06-24 MED ORDER — LACTATED RINGERS IV SOLN
INTRAVENOUS | Status: AC | PRN
  Administered 2020-06-24: 1000 mL via INTRAVENOUS

## 2020-06-24 NOTE — Plan of Care (Signed)
  Problem: Education: Goal: Knowledge of General Education information will improve Description: Including pain rating scale, medication(s)/side effects and non-pharmacologic comfort measures Outcome: Progressing   Problem: Clinical Measurements: Goal: Ability to maintain clinical measurements within normal limits will improve Outcome: Progressing Goal: Will remain free from infection Outcome: Progressing

## 2020-06-24 NOTE — Op Note (Signed)
[facility] Patient Name: [name] Procedure Date : 06/24/2020 MRN: [id number] Attending MD: Clarene Essex , MD Date of Birth: 1932-04-13 CSN: [id number] Age: 86 Admit Type: Inpatient Procedure:                Upper GI endoscopy Indications:              Dysphagia, Abnormal CT of the GI tract Providers:                Clarene Essex, MD, Particia Nearing, RN, Benetta Spar,                            Technician, Tyna Jaksch Technician, Clearnce Sorrel,                            CRNA Referring MD:              Medicines:                Propofol total dose 818 mg IV Complications:            No immediate complications. Estimated Blood Loss:     Estimated blood loss was minimal. Procedure:                Pre-Anesthesia Assessment:                           - Prior to the procedure, a History and Physical                            was performed, and patient medications and                            allergies were reviewed. The patient's tolerance of                            previous anesthesia was also reviewed. The risks                            and benefits of the procedure and the sedation                            options and risks were discussed with the patient.                            All questions were answered, and informed consent                            was obtained. Prior Anticoagulants: The patient has                            taken Plavix (clopidogrel), last dose was 2 days                            prior to procedure. ASA Grade Assessment: III - A  patient with severe systemic disease. After                            reviewing the risks and benefits, the patient was                            deemed in satisfactory condition to undergo the                            procedure.                           After obtaining informed consent, the endoscope was                            passed under direct vision. Throughout the                             procedure, the patient's blood pressure, pulse, and                            oxygen saturations were monitored continuously. The                            GIF-H190 ([id number]) Olympus gastroscope was                            introduced through the mouth, and advanced to the                            third part of duodenum. The upper GI endoscopy was                            accomplished without difficulty. The patient                            tolerated the procedure well. Scope In: Scope Out: Findings:      The larynx was normal.      A medium-sized hiatal hernia was present.      One benign-appearing, intrinsic moderate (circumferential scarring or       stenosis; an endoscope may pass) stenosis was found. The stenosis was       traversed. A TTS dilator was passed through the scope. Dilation with a       12-13.5-15 mm balloon dilator was performed to 15 mm. The dilation site       was examined and showed moderate mucosal disruption and moderate       improvement in luminal narrowing with minimal oozing after the dilation.      Moderately severe esophagitis with no bleeding was found.      A small amount of food (residue) was found in the gastric fundus.      The duodenal bulb, first portion of the duodenum, second portion of the       duodenum and third portion of the duodenum were normal.      The exam was otherwise without abnormality. Impression:               -  Normal larynx.                           - Medium-sized hiatal hernia.                           - Benign-appearing esophageal stenosis. Dilated.                           - Moderately severe reflux esophagitis with no                            bleeding.                           - A small amount of food (residue) in the stomach.                           - Normal duodenal bulb, first portion of the                            duodenum, second portion of the duodenum and third                             portion of the duodenum.                           - The examination was otherwise normal.                           - No specimens collected. Recommendation:           - Clear liquid diet today. Use antireflux measures                            if doing well with no delayed complications can                            advance diet tomorrow                           - Use Protonix (pantoprazole) 40 mg PO BID                            indefinitely.                           - Return to GI clinic PRN.                           - Telephone GI clinic if symptomatic PRN.                           - Resume Plavix (clopidogrel) at prior dose in 2                            days. Procedure Code(s):        ---  Professional ---                           [phone], Esophagogastroduodenoscopy, flexible,                            transoral; with transendoscopic balloon dilation of                            esophagus (less than 30 mm diameter) Diagnosis Code(s):        --- Professional ---                           K44.9, Diaphragmatic hernia without obstruction or                            gangrene                           K22.2, Esophageal obstruction                           K21.00, Gastro-esophageal reflux disease with                            esophagitis, without bleeding                           R13.10, Dysphagia, unspecified                           R93.3, Abnormal findings on diagnostic imaging of                            other parts of digestive tract CPT copyright 2019 [employer]. All rights reserved. The codes documented in this report are preliminary and upon coder review may  be revised to meet current compliance requirements. Clarene Essex, MD 06/24/2020 9:45:24 AM This report has been signed electronically. Number of Addenda: 0

## 2020-06-24 NOTE — Progress Notes (Signed)
Daughter notes that patient has not been taking her pulmonary hypertension medcations for the last five days due to dysphagia. On admission, pharm consulted about continuing to hold these medications given her NPO status. Patient is now tolerating PO. Dr. Aundra Dubin is not on this weekend but on-call [facility] MD (Dr. Johnsie Cancel) contacted who reviewed chart and recommended that all medications can be restarted without need for titration as she has been on these medications and tolerated well.   Wilber Oliphant, M.D.  12:13 PM 06/24/2020

## 2020-06-24 NOTE — Anesthesia Preprocedure Evaluation (Signed)
Anesthesia Evaluation  Patient identified by MRN, date of birth, ID band Patient awake    Reviewed: Allergy & Precautions, H&P , NPO status , Patient's Chart, lab work & pertinent test results  Airway Mallampati: II   Neck ROM: full    Dental   Pulmonary former smoker,    breath sounds clear to auscultation       Cardiovascular hypertension, + CAD and + Cardiac Stents   Rhythm:regular Rate:Normal     Neuro/Psych  Headaches, PSYCHIATRIC DISORDERS Anxiety Depression CVA    GI/Hepatic GERD  ,Esophageal stricture   Endo/Other  diabetes, Type 2  Renal/GU      Musculoskeletal  (+) Arthritis ,   Abdominal   Peds  Hematology  (+) Blood dyscrasia, anemia ,   Anesthesia Other Findings   Reproductive/Obstetrics                             Anesthesia Physical Anesthesia Plan  ASA: III  Anesthesia Plan: MAC   Post-op Pain Management:    Induction: Intravenous  PONV Risk Score and Plan: 2 and Propofol infusion and Treatment may vary due to age or medical condition  Airway Management Planned: Nasal Cannula  Additional Equipment:   Intra-op Plan:   Post-operative Plan:   Informed Consent: I have reviewed the patients History and Physical, chart, labs and discussed the procedure including the risks, benefits and alternatives for the proposed anesthesia with the patient or authorized representative who has indicated his/her understanding and acceptance.       Plan Discussed with: CRNA, Anesthesiologist and Surgeon  Anesthesia Plan Comments:         Anesthesia Quick Evaluation

## 2020-06-24 NOTE — Discharge Summary (Signed)
[name] Medical record number: [id number] Date of birth: 09/09/1931 Age: 86 y.o. Gender: female Date of Admission: 06/23/2020  Date of Discharge: 06/24/20    Admitting Physician: Delora Fuel, MD  Primary Care Provider: [name], PA-C Consultants: [place]  Indication for Hospitalization: dysphagia  Discharge Diagnoses/Problem List:  Active Problems:   Dysphagia   Hiatal hernia   Disposition: Discharge to home  Discharge Condition: Good  Discharge Exam:  See Daily Progress Note for exam   Brief Hospital Course:  [name] is a 86 y.o. female with past medical history significant for pulmonary hypertension, history of CVA, CKD stage III, CAD, diabetes, hypertension who presented with dysphagia for 4 days.  Patient able to take small amounts of liquids but could not tolerate solids.  On admission, patient hemodynamically stable with no concerning physical exam findings.  CT concerning for gastric obstruction at the level of the diaphragmatic hiatus.  GI was consulted for upper endoscopy. Endoscopy showed normal larynx and medium sized hiatal hernia.  Benign-appearing esophageal stenosis which was dilated.  There was moderately severe reflux esophagitis with no bleeding.  Normal duodenum.  No specimens were collected.  Recommendations from gastroenterology were clear liquid diet with advancing diet as tolerated the next day.  She is to start 40 mg p.o. twice daily Protonix indefinitely.  She should return to the GI clinic as needed or if symptoms worsen.  She is to resume her Plavix on 06/27/2019 which was held for the procedure. Patient was tolerating p.o. at the time of discharge. Patient's daughter was concerned about pulmonary hypertension medications that were not dosed for about 5 days prior when patient started to have symptoms of dysphagia.  These medications  were held on admission.  On-call cardiology was consulted who recommended that patient could restart pulmonary hypertension medications without any titration.  Issues for Follow Up:  1. Continue p.o. Protonix 40 mg twice daily indefinitely.  30 days sent to pharmacy.  Nexium was discontinued. 2. Ensure patient to resume Plavix on 06/26/2020.  Significant Procedures:   Procedure Orders     Procedural/ Surgical Case Request: ESOPHAGOGASTRODUODENOSCOPY (EGD)     UPPER ENDOSCOPY     EKG 12-Lead  Significant Labs and Imaging:  Recent Labs  Lab 06/23/20 1009 06/24/20 0046  WBC 6.5 6.5  HGB 11.1* 9.8*  HCT 37.9 33.2*  PLT 278 244   Recent Labs  Lab 06/23/20 1009 06/24/20 0046  NA 140 145  K 4.0 3.9  CL 102 109  CO2 25 24  GLUCOSE 168* 118*  BUN 22 16  CREATININE 1.04* 0.84  CALCIUM 9.4 8.9  MG 1.8  --   ALKPHOS 56  --   AST 21  --   ALT 11  --   ALBUMIN 3.3*  --     CT ABDOMEN PELVIS W CONTRAST  Result Date: 06/23/2020 CLINICAL DATA:  Vomiting and chest burning since Tuesday. Bowel obstruction suspected. EXAM: CT ABDOMEN AND PELVIS WITH CONTRAST TECHNIQUE: Multidetector CT imaging of the abdomen and pelvis was performed using the standard protocol following bolus administration of intravenous contrast. CONTRAST:  151m OMNIPAQUE IOHEXOL 300 MG/ML  SOLN COMPARISON:  12/27/2016 FINDINGS: Lower chest: Bibasilar collapse/consolidation noted, right greater than left. Hepatobiliary: Multiple hepatic cysts again noted. Layering tiny calcified gallstones evident. No intrahepatic or extrahepatic biliary dilation. Pancreas: No focal mass lesion. No dilatation of the main duct. No intraparenchymal cyst. No peripancreatic  edema. Spleen: No splenomegaly. No focal mass lesion. Adrenals/Urinary Tract: No adrenal nodule or mass. Stable appearance calcified complex lesion in the lateral interpolar right kidney measuring 2.5 x 1.6 cm today. Multiple hypoattenuating lesions are seen in both  kidneys, too small to characterize but likely benign. No overtly suspicious gross enhancing mass lesion in either kidney. No evidence for hydroureter. Bladder is moderately distended. Stomach/Bowel: Moderate hiatal hernia. Food and debris are seen in the hernia above the hiatus. Stomach distal to the hiatus is decompressed. Duodenum is normally positioned as is the ligament of Treitz. No small bowel wall thickening. No small bowel dilatation. The terminal ileum is normal. The appendix is not visualized, but there is no edema or inflammation in the region of the cecum. No gross colonic mass. No colonic wall thickening. Diverticular changes are noted in the left colon without evidence of diverticulitis. Vascular/Lymphatic: There is abdominal aortic atherosclerosis without aneurysm. There is no gastrohepatic or hepatoduodenal ligament lymphadenopathy. No retroperitoneal or mesenteric lymphadenopathy. No pelvic sidewall lymphadenopathy. Reproductive: The uterus is surgically absent. There is no adnexal mass. Other: No intraperitoneal free fluid. Musculoskeletal: No worrisome lytic or sclerotic osseous abnormality. Old left pubic rami fractures evident. Superior endplate compression deformity noted at L1, age indeterminate, but new since chest CT 02/27/2018. IMPRESSION: 1. Moderate hiatal hernia with fluid and debris in the hernia above the hiatus. Stomach is decompressed distal to the hiatus. As such, gastric obstruction at the level of the diaphragmatic hiatus cannot be excluded in this patient with a history of vomiting. 2. No acute findings in the abdomen/pelvis. Specifically, no evidence for small bowel obstruction. 3. Bibasilar collapse/consolidation, right greater than left. Multifocal pneumonia a consideration. 4. Cholelithiasis. 5. Left colonic diverticulosis without diverticulitis. 6. Aortic Atherosclerosis (ICD10-I70.0). Electronically Signed   By: Misty Stanley M.D.   On: 06/23/2020 14:13   DG Chest  Portable 1 View  Result Date: 06/23/2020 CLINICAL DATA:  Chest pain with 3 day history of weakness. EXAM: PORTABLE CHEST 1 VIEW COMPARISON:  10/06/2018 FINDINGS: 1021 hours. Cardiopericardial silhouette is at upper limits of normal for size. Interstitial markings are diffusely coarsened with chronic features. Similar appearance of streaky basilar opacity bilaterally, likely chronic atelectasis or scarring. Interval development of subtle bibasilar airspace disease without substantial pleural effusion. Bones are diffusely demineralized. Telemetry leads overlie the chest. IMPRESSION: 1. Interval development of subtle bibasilar airspace disease. Bibasilar pneumonia not excluded. 2. Otherwise stable exam. Chronic interstitial changes with bibasilar atelectasis or scarring. Electronically Signed   By: Misty Stanley M.D.   On: 06/23/2020 10:51    Results/Tests Pending at Time of Discharge:  . none  Discharge Medications:  Allergies as of 06/24/2020      Reactions   Sulfa Antibiotics Swelling   Mouth and tongue swelling      Medication List    STOP taking these medications   esomeprazole 40 MG capsule Commonly known as: NEXIUM     TAKE these medications   Adempas 2 MG Tabs Generic drug: Riociguat Take 2 mg by mouth 3 (three) times daily.   ALPRAZolam 0.5 MG tablet Commonly known as: XANAX Take 0.5 mg by mouth daily as needed for anxiety.   aspirin EC 81 MG tablet Take 1 tablet (81 mg total) by mouth daily.   atorvastatin 80 MG tablet Commonly known as: LIPITOR Take 1 tablet (80 mg total) by mouth daily.   carvedilol 3.125 MG tablet Commonly known as: COREG TAKE 1 TABLET BY MOUTH TWICE DAILY WITH A MEAL What  changed: See the new instructions.   carvedilol 3.125 MG tablet Commonly known as: COREG TAKE 1 TABLET BY MOUTH TWICE DAILY WITH A MEAL What changed: Another medication with the same name was changed. Make sure you understand how and when to take each.   clopidogrel 75 MG  tablet Commonly known as: PLAVIX Take 1 tablet (75 mg total) by mouth daily. Start taking on: June 26, 2020 What changed: These instructions start on June 26, 2020. If you are unsure what to do until then, ask your doctor or other care provider.   cyclobenzaprine 10 MG tablet Commonly known as: FLEXERIL Take 10 mg by mouth at bedtime.   FERROUS SULFATE PO Take 40 mg by mouth daily.   furosemide 40 MG tablet Commonly known as: LASIX Take 1 tablet (40 mg total) by mouth every morning AND 0.5 tablets (20 mg total) every evening. What changed: See the new instructions.   gabapentin 300 MG capsule Commonly known as: NEURONTIN Take 300 mg by mouth 3 (three) times daily.   icosapent Ethyl 1 g capsule Commonly known as: Vascepa Take 2 capsules (2 g total) by mouth 2 (two) times daily.   lactose free nutrition Liqd Take 237 mLs by mouth 2 (two) times daily between meals.   mirtazapine 30 MG tablet Commonly known as: REMERON Take 30 mg by mouth at bedtime.   Opsumit 10 MG tablet Generic drug: macitentan Take 1 tablet (10 mg total) by mouth daily.   Orenitram 0.125 MG Tbcr Generic drug: Treprostinil Diolamine ER Take 0.125 mg by mouth 3 (three) times daily.   pantoprazole 40 MG tablet Commonly known as: Protonix Take 1 tablet (40 mg total) by mouth 2 (two) times daily.   potassium chloride SA 20 MEQ tablet Commonly known as: KLOR-CON TAKE 1 TABLET(20 MEQ) BY MOUTH DAILY What changed:   how much to take  how to take this  when to take this  additional instructions   traMADol-acetaminophen 37.5-325 MG tablet Commonly known as: ULTRACET Take 2 tablets by mouth 4 (four) times daily as needed for pain.       Discharge Instructions: Please refer to Patient Instructions section of EMR for full details.  Patient was counseled important signs and symptoms that should prompt return to medical care, changes in medications, dietary instructions, activity  restrictions, and follow up appointments.   Follow-Up Appointments: Future Appointments  Date Time Provider [place]  07/12/2020 11:20 AM Larey Dresser, MD [facility] None     Wilber Oliphant, MD 06/24/2020, 2:10 PM PGY-3, [place]

## 2020-06-24 NOTE — Discharge Instructions (Signed)
Dear [name],   Thank you for letting us participate in your care! In this section, you will find a brief hospital admission summary of why you were admitted to the hospital, what happened during your admission, your diagnosis/diagnoses, and recommended follow up.   You were admitted because you were experiencing difficulty with eating. You were seen by gastroenterology and had an endoscopy procedure. They recommend that you start taking protonix 40 mg twice daily indefinitely. You can advance your diet slowly as tolerated.   Your Plavix was held for the procedure. You can resume your Plavix on 06/26/20. You can follow up with gastroenterology as needed.   Your daughter also noted that you had not been taking your pulmonary hypertension medications due to your difficulty with swallowing for the last few days. You can restart these medications without any titration.   POST-HOSPITAL & CARE INSTRUCTIONS 1. Start protonix 64m twice daily  2. Please let PCP/Specialists know of any changes that were made.  3. Please see medications section of this packet for any medication changes.   DOCTOR'S APPOINTMENT & FOLLOW UP CARE INSTRUCTIONS  Future Appointments  Date Time Provider [place] 07/12/2020 11:20 AM MLarey Dresser MD [facility] None     [facility] Take care and be well!  [facility] [address] [place] [phone]

## 2020-06-24 NOTE — Progress Notes (Signed)
Pt discharge education went over at bedside with pt and pt daughter  Pt IV removed, catheter intact  Pt has all belongings including home oxygen Pt discharged, on 4L [place], via wheelchair with NT

## 2020-06-24 NOTE — Progress Notes (Signed)
[name] 8:42 AM  Subjective: Patient seen and examined and discussed with my partner Dr. Michail Sermon in her hospital computer chart was reviewed she has had these procedures before both here and [place] and she has no other complaints  Objective: Vital signs stable afebrile no acute distress exam please see preassessment evaluation labs reviewed slight hemoglobin drop CT reviewed  Assessment: Abnormal CT and dysphagia  Plan: Okay to proceed with endoscopy with anesthesia assistance and possible dilation  [facility]  office [phone] After 5PM or if no answer call [phone]

## 2020-06-24 NOTE — Progress Notes (Signed)
Family Medicine Teaching Service Daily Progress Note Intern Pager: [phone]  Patient name: [name] Medical record number: [id number] Date of birth: [birth year] Age: 86 y.o. Gender: female  Primary Care Provider: [name], PA-C Consultants: GI Code Status: DNR  Pt Overview and Major Events to Date:   11/19: admitted, GI consulted  Assessment and Plan:  [name] is an 86 y/o female who presented with dysphagia. PMHx is significant for pulmonary hypertension, HTN, HLD, CVA, CKD stage 3, T2DM, CAD s/p stent placement, GERD, depression, and anemia.  Dysphagia 2/2 gastric obstruction above the diaphragmatic hiatus Remains hemodynamically stable. Able to tolerate secretions. CT shows large amount of food/debris in moderate sized hiatal hernia with decompression of stomach distal to hiatus. -GI following, appreciate recommmendations -Strict NPO -Plan for EGD today -Continue 3/36mVF (NS @ 75cc/hr)  Pulmonary HTN Chronic, stable. Remains on her home O2 (4L [place]). Home meds: Opsumit 119m Adempas 22m65mOrinterim 0.125m41mContinue supplemental O2 as needed -Holding home meds (strict NPO due to gastric obstruction) -Plan to resume home meds s/p EGD  HTN BP stable since admission (range 108-989-211tolic). Home meds: Lasix 50mg54mly and Carvedilol 3.125mg 68mding home meds (strict NPO) -Consider IV metoprolol if elevated BP while NPO -Continue to monitor BP  CAD s/p stent placement March 2020 Chronic, stable. -ASA Suppository -Resume home ASA 81mg d88m and Plavix 75mg da322mafter EGD  Normocytic Anemia, chronic Hemoglobin 9.8 today (from 11.1 on admission). Baseline appears to be ~10. On 40 mg ferrous sulfate daily at home. -Holding home iron, plan to resume when no longer NPO -daily CBC  GERD Chronic, stable. On Nexium 40 mg and Protonix 40 mg at home. -Holding home meds, plan to resume when able to take PO  Depression Chronic, stable. On Remeron 30 mg at  home. -Holding home Remeron, plan to resume when able to take PO  Lumbar herniated disc/back pain Chronic, stable. On gabapentin 300 mg daily, Flexeril 10 mg daily, and Ultracet 37.5-325 mg 4 times daily at home.  -Holding home meds  Hyperlipidemia Chronic, stable. LDL in September 2021 was 38.  On atorvastatin 80 mg at home. -Hold home atorvastatin, will resume when able to take PO   FEN/GI: NPO, NS @ 75 cc/hr PPx: SCDs   Status is: Observation The patient remains OBS appropriate and will d/c before 2 midnights.  Dispo: The patient is from: Home              Anticipated d/c is to: Home              Anticipated d/c date is: 1 day              Patient currently is not medically stable to d/c.    Subjective:  No acute events overnight. Patient denies complaints this morning other than not getting any sleep last night.  Objective: Temp:  [98.1 F (36.7 C)-98.6 F (37 C)] 98.2 F (36.8 C) (11/20 0500) Pulse Rate:  [78-102] 92 (11/20 0500) Resp:  [14-20] 20 (11/20 0500) BP: (108-158)/(53-111) 147/67 (11/20 0500) SpO2:  [93 %-99 %] 95 % (11/20 0500) Weight:  [62.6 kg] 62.6 kg (11/19 0947) Physical Exam: General: alert, well-appearing, NAD Cardiovascular: RRR, normal S1/S2 without m/r/g Respiratory: normal WOB, lungs CTAB Abdomen: soft, nontender, nondistended Extremities: no peripheral edema  Laboratory: Recent Labs  Lab 06/23/20 1009 06/24/20 0046  WBC 6.5 6.5  HGB 11.1* 9.8*  HCT 37.9 33.2*  PLT 278 244   Recent Labs  Lab  06/23/20 1009 06/24/20 0046  NA 140 145  K 4.0 3.9  CL 102 109  CO2 25 24  BUN 22 16  CREATININE 1.04* 0.84  CALCIUM 9.4 8.9  PROT 7.8  --   BILITOT 0.4  --   ALKPHOS 56  --   ALT 11  --   AST 21  --   GLUCOSE 168* 118*    Imaging/Diagnostic Tests: No new imaging/diagnostic tests.   Alcus Dad, MD 06/24/2020, 6:53 AM PGY-1, [place] Intern pager: [phone], text pages welcome

## 2020-06-24 NOTE — Transfer of Care (Signed)
Immediate Anesthesia Transfer of Care Note  Patient: [name]  Procedure(s) Performed: ESOPHAGOGASTRODUODENOSCOPY (EGD) (N/A )  Patient Location: Endoscopy Unit  Anesthesia Type:MAC  Level of Consciousness: drowsy  Airway & Oxygen Therapy: Patient Spontanous Breathing and Patient connected to nasal cannula oxygen  Post-op Assessment: Report given to RN and Post -op Vital signs reviewed and stable  Post vital signs: Reviewed and stable  Last Vitals:  Vitals Value Taken Time  BP    Temp    Pulse    Resp    SpO2      Last Pain:  Vitals:   06/24/20 0831  TempSrc: Oral  PainSc: 0-No pain         Complications: No complications documented.

## 2020-06-26 ENCOUNTER — Encounter (HOSPITAL_COMMUNITY): Payer: Self-pay | Admitting: Gastroenterology

## 2020-06-27 NOTE — Anesthesia Postprocedure Evaluation (Signed)
Anesthesia Post Note  Patient: TAYSIA RIVERE  Procedure(s) Performed: ESOPHAGOGASTRODUODENOSCOPY (EGD) (N/A ) BALLOON DILATION (N/A )     Patient location during evaluation: Endoscopy Anesthesia Type: MAC Level of consciousness: awake and alert Pain management: pain level controlled Vital Signs Assessment: post-procedure vital signs reviewed and stable Respiratory status: spontaneous breathing, nonlabored ventilation, respiratory function stable and patient connected to nasal cannula oxygen Cardiovascular status: blood pressure returned to baseline and stable Postop Assessment: no apparent nausea or vomiting Anesthetic complications: no   No complications documented.  Last Vitals:  Vitals:   06/24/20 0956 06/24/20 1024  BP: (!) 161/62 127/81  Pulse: 81 78  Resp: 19 18  Temp:  36.9 C  SpO2: 92% 97%    Last Pain:  Vitals:   06/24/20 1345  TempSrc:   PainSc: 0-No pain                 Zeenat Jeanbaptiste S

## 2020-06-28 ENCOUNTER — Telehealth (HOSPITAL_COMMUNITY): Payer: Self-pay | Admitting: Pharmacist

## 2020-06-28 MED ORDER — ORENITRAM 1 MG PO TBCR: 1.0000 mg | EXTENDED_RELEASE_TABLET | Freq: Three times a day (TID) | ORAL | 11 refills | Status: DC

## 2020-06-28 NOTE — Telephone Encounter (Signed)
Orenitram prescription sent to [place]. Patient is willing to restart titration.    Audry Riles, PharmD, BCPS, BCCP, CPP Heart Failure Clinic Pharmacist [phone]

## 2020-07-12 ENCOUNTER — Encounter (HOSPITAL_COMMUNITY): Payer: Self-pay | Admitting: Cardiology

## 2020-07-12 ENCOUNTER — Ambulatory Visit (HOSPITAL_COMMUNITY)
Admission: RE | Admit: 2020-07-12 | Discharge: 2020-07-12 | Disposition: A | Discharge status: Home / Self Care | Payer: Medicare Other | Source: Ambulatory Visit | Attending: Cardiology | Admitting: Cardiology

## 2020-07-12 ENCOUNTER — Other Ambulatory Visit: Payer: Self-pay

## 2020-07-12 VITALS — BP 140/80 | HR 81 | Wt 136.6 lb

## 2020-07-12 DIAGNOSIS — Z87891 Personal history of nicotine dependence: Secondary | ICD-10-CM | POA: Diagnosis not present

## 2020-07-12 DIAGNOSIS — E7849 Other hyperlipidemia: Secondary | ICD-10-CM

## 2020-07-12 DIAGNOSIS — Z955 Presence of coronary angioplasty implant and graft: Secondary | ICD-10-CM | POA: Insufficient documentation

## 2020-07-12 DIAGNOSIS — I2510 Atherosclerotic heart disease of native coronary artery without angina pectoris: Secondary | ICD-10-CM | POA: Diagnosis not present

## 2020-07-12 DIAGNOSIS — I272 Pulmonary hypertension, unspecified: Secondary | ICD-10-CM | POA: Insufficient documentation

## 2020-07-12 DIAGNOSIS — I25119 Atherosclerotic heart disease of native coronary artery with unspecified angina pectoris: Secondary | ICD-10-CM | POA: Diagnosis not present

## 2020-07-12 DIAGNOSIS — Z7982 Long term (current) use of aspirin: Secondary | ICD-10-CM | POA: Insufficient documentation

## 2020-07-12 DIAGNOSIS — Z7902 Long term (current) use of antithrombotics/antiplatelets: Secondary | ICD-10-CM | POA: Diagnosis not present

## 2020-07-12 DIAGNOSIS — I50810 Right heart failure, unspecified: Secondary | ICD-10-CM

## 2020-07-12 DIAGNOSIS — I110 Hypertensive heart disease with heart failure: Secondary | ICD-10-CM | POA: Diagnosis not present

## 2020-07-12 DIAGNOSIS — J439 Emphysema, unspecified: Secondary | ICD-10-CM | POA: Diagnosis not present

## 2020-07-12 DIAGNOSIS — I5032 Chronic diastolic (congestive) heart failure: Secondary | ICD-10-CM | POA: Diagnosis not present

## 2020-07-12 DIAGNOSIS — Z79899 Other long term (current) drug therapy: Secondary | ICD-10-CM | POA: Diagnosis not present

## 2020-07-12 DIAGNOSIS — E785 Hyperlipidemia, unspecified: Secondary | ICD-10-CM | POA: Diagnosis not present

## 2020-07-12 LAB — LIPID PANEL
Cholesterol: 119 mg/dL (ref 0–200)
HDL: 29 mg/dL — ABNORMAL LOW (ref >40)
LDL Cholesterol: 48 mg/dL (ref 0–99)
Total CHOL/HDL Ratio: 4.1 RATIO
Triglycerides: 209 mg/dL — ABNORMAL HIGH (ref <150)
VLDL: 42 mg/dL — ABNORMAL HIGH (ref 0–40)

## 2020-07-12 LAB — BASIC METABOLIC PANEL
Anion gap: 10 (ref 5–15)
BUN: 37 mg/dL — ABNORMAL HIGH (ref 8–23)
CO2: 29 mmol/L (ref 22–32)
Calcium: 9.1 mg/dL (ref 8.9–10.3)
Chloride: 97 mmol/L — ABNORMAL LOW (ref 98–111)
Creatinine, Ser: 0.98 mg/dL (ref 0.44–1.00)
GFR, Estimated: 56 mL/min — ABNORMAL LOW (ref >60)
Glucose, Bld: 153 mg/dL — ABNORMAL HIGH (ref 70–99)
Potassium: 4.9 mmol/L (ref 3.5–5.1)
Sodium: 136 mmol/L (ref 135–145)

## 2020-07-12 LAB — BRAIN NATRIURETIC PEPTIDE: B Natriuretic Peptide: 197.3 pg/mL — ABNORMAL HIGH (ref 0.0–100.0)

## 2020-07-12 NOTE — Patient Instructions (Signed)
You will get a call regarding increasing the dose of the orenitram.   Labs today We will only contact you if something comes back abnormal or we need to make some changes. Otherwise no news is good news!  Your physician recommends that you schedule a follow-up appointment in: 2 months with Dr Aundra Dubin   Please call office at [phone] option 2 if you have any questions or concerns.    At the [facility], you and your health needs are our priority. As part of our continuing mission to provide you with exceptional heart care, we have created designated Provider Care Teams. These Care Teams include your primary Cardiologist (physician) and Advanced Practice Providers (APPs- Physician Assistants and Nurse Practitioners) who all work together to provide you with the care you need, when you need it.   You may see any of the following providers on your designated Care Team at your next follow up: Marland Kitchen Dr Glori Bickers . Dr Loralie Champagne . Darrick Grinder, NP . Lyda Jester, PA . Audry Riles, PharmD   Please be sure to bring in all your medications bottles to every appointment.

## 2020-07-13 NOTE — Progress Notes (Signed)
ID:  [name], DOB 1931-10-02, MRN [id number]   Provider location: [place] [facility] Failure Type of Visit: Established patient   PCP:  [name], PA-C  Cardiologist:  Jenkins Rouge, MD HF Cardiology: Dr. Aundra Dubin   History of Present Illness: [name] is an 86 y.o. with history of DM, HTN, and prior osteomyelitis of jaw referred by Dr. Johnsie Cancel for evaluation of pulmonary hypertension/RV failure.  She reports an episode of PNA in 3/19 and says that she never recovered from this.  She had gradually progressive exertional dyspnea to the point where she was short of breath walking around the house.  She had an echo in 7/19 that showed mildly decreased LV systolic function but severely decreased RV systolic function with dilated RV and at least moderate pulmonary hypertension by echo estimation.  She saw her PCP in 8/19 and was noted to be hypoxemic at rest.  He started her on home oxygen 2 L/[place] which she continues.    She was referred to cardiology, saw Dr. Johnsie Cancel. Amlodipine was stopped and she was started empirically on sildenafil 20 mg tid for pulmonary hypertension.   She was referred to CHF clinic.    RHC/LHC was done in 10/19, showing complex severe bifurcation lesion involving the proximal LAD and large D2 ostium. She had complex PCI requiring atherectomy and DES to pLAD and ostial D2. RHC showed PAH with PVR 11.6 WU and CI 1.7.  She subsequently was found to have a right groin pseudoaneurysm from cath.   She was started on Opsumit.  Serologies have suggested CREST syndrome.  However, she saw Dr. Amil Amen for rheumatology who told her that he did not think that she had a rheumatologic diagnosis (per her report).   Cardiolite was abnormal in 3/20, this was followed by [facility] in 3/20 that showed nonobstructive CAD.  Echo in 3/20 showed EF 55-60%, with moderately decreased RV systolic function, PASP 64 mmHg.   Echo (3/21) showed EF 55-60%, moderately dilated RV with  moderately decreased systolic function, PASP 38 mmHg.   She returns today for followup of pulmonary hypertension and CAD.  She continues to wear home oxygen 4L [place].  She is now on orenitram, slowly increasing the dose and tolerating increase better than in the past.  She continues on Opsumit and riociguat at goal dose.  She says that her breathing has overall improved and that the [facility] medication has helped.  No dyspnea walking around the house.  No orthopnea/PND.  Weight is down 4 lbs.  No chest pain or lightheadedness.  She had a recent esophageal dilation.    6 minute walk (12/19): 79 m 6 minute walk (1/20): 121 m 6 minute walk (2/21): 91 m 6 minute walk (9/21): 152 m  Labs (12/19): K 3.9, creatinine 0.78 Labs (3/20): K 4, creatinine 1.1, hgb 10.2 Labs (12/20): LDL 67, TGs 28, K 4.4, creatinine 1.04 Labs (2/21): BNP 181, K 4, creatinine 0.94 Labs (5/21): K 4.2, creatinine 0.94, BNP 202 Labs (8/21): hgb 10.4 Labs (9/21): LDL 38, TGs 289 Labs (11/21): K 3.9, creatinine 0.84  PMH: 1. H/o osteomyelitis of jaw.  2. Duodenal ulcers with upper GI bleeding. 3. Type 2 diabetes 4. Hyperlipidemia 5. Squamous cell carcinoma of scalp with radiation and surgery.  6. GERD 7. H/o back surgery  8. HTN 9. Pulmonary hypertension/RV failure: Echo (7/19) with EF 45-50%, moderate focal basal septal hypertrophy, mild diffuse hypokinesis, septal flattening, severe RV dilation with severely decreased systolic function,  moderate TR, PASP 61 mmHg.   - CT chest (7/19): Mild emphysema, enlarged PA.  - V/Q scan (8/19): No evidence for chronic PE.  - anti-centromere Ab positive, ANA positive, RF 73 => suspect CREST syndrome.  - RHC (10/19): mean RA 7, PA 64/21 mean 38, mean PCWP 8, CI 1.7, PVR 11.6 WU.  - Echo (3/20): EF 55-60%, moderately decreased RV systolic function, PASP 64 mmHg - Echo (3/21): EF 55-60%, moderately dilated RV with moderately decreased systolic function, PASP 38 mmHg. 10. CAD: LHC (10/19)  with 95% proximal LAD and 95% ostial D2.  Complicated bifurcation intervention with atherectomy and DES to proximal LAD and ostial D2.   - LHC (3/20): 40% mLAD, 30% mRCA.  11. Post-cath right groin pseudoaneurysm in 10/19.   SH: Lives in [place] alone, daughter lives very close.  Quit smoking > 20 years ago. No ETOH.   FH: No history of pulmonary hypertension.  Mother with "heart problems."  Sister with PPM.    Review of systems complete and found to be negative unless listed in HPI.   Current Outpatient Medications  Medication Sig Dispense Refill  . ALPRAZolam (XANAX) 0.5 MG tablet Take 0.5 mg by mouth daily as needed for anxiety.     Marland Kitchen aspirin EC 81 MG tablet Take 1 tablet (81 mg total) by mouth daily. 30 tablet 11  . atorvastatin (LIPITOR) 80 MG tablet Take 1 tablet (80 mg total) by mouth daily. 90 tablet 3  . carvedilol (COREG) 3.125 MG tablet TAKE 1 TABLET BY MOUTH TWICE DAILY WITH A MEAL 60 tablet 11  . clopidogrel (PLAVIX) 75 MG tablet Take 1 tablet (75 mg total) by mouth daily. 90 tablet 3  . cyclobenzaprine (FLEXERIL) 10 MG tablet Take 10 mg by mouth at bedtime.    . famotidine (PEPCID) 20 MG tablet Take 20 mg by mouth 2 (two) times daily.    Marland Kitchen FERROUS SULFATE PO Take 40 mg by mouth daily.    . furosemide (LASIX) 40 MG tablet Take 1 tablet (40 mg total) by mouth every morning AND 0.5 tablets (20 mg total) every evening. 45 tablet 5  . gabapentin (NEURONTIN) 300 MG capsule Take 300 mg by mouth as needed.    Marland Kitchen icosapent Ethyl (VASCEPA) 1 g capsule Take 2 g by mouth 2 (two) times daily.    Marland Kitchen lactose free nutrition (BOOST) LIQD Take 237 mLs by mouth 2 (two) times daily between meals.     . mirtazapine (REMERON) 30 MG tablet Take 30 mg by mouth at bedtime.    . OPSUMIT 10 MG tablet Take 1 tablet (10 mg total) by mouth daily. 30 tablet 11  . potassium chloride SA (KLOR-CON) 20 MEQ tablet TAKE 1 TABLET(20 MEQ) BY MOUTH DAILY 30 tablet 6  . Riociguat (ADEMPAS) 2.5 MG TABS Take 2.5 mg by  mouth in the morning, at noon, and at bedtime.    . traMADol-acetaminophen (ULTRACET) 37.5-325 MG tablet Take 2 tablets by mouth 4 (four) times daily as needed for pain.  2  . Treprostinil Diolamine ER 0.125 MG TBCR Take 0.125 mg by mouth in the morning, at noon, and at bedtime. 0.25 mg 3x a day     No current facility-administered medications for this encounter.   BP 140/80   Pulse 81   Wt 62 kg (136 lb 9.6 oz)   SpO2 97% Comment: 4 l n/c  BMI 24.59 kg/m   Wt Readings from Last 3 Encounters:  07/12/20 62 kg (136 lb  9.6 oz)  06/24/20 62.6 kg (138 lb 0.1 oz)  04/12/20 63.7 kg (140 lb 6.4 oz)   BP 140/80   Pulse 81   Wt 62 kg (136 lb 9.6 oz)   SpO2 97% Comment: 4 l n/c  BMI 24.59 kg/m  General: NAD Neck: JVP 8 cm, no thyromegaly or thyroid nodule.  Lungs: Clear to auscultation bilaterally with normal respiratory effort. CV: Nondisplaced PMI.  Heart regular S1/S2, no S3/S4, no murmur.  No peripheral edema.  No carotid bruit.  Normal pedal pulses.  Abdomen: Soft, nontender, no hepatosplenomegaly, no distention.  Skin: Intact without lesions or rashes.  Neurologic: Alert and oriented x 3.  Psych: Normal affect. Extremities: No clubbing or cyanosis.  HEENT: Normal.   Assessment/Plan: 1. CAD: Coronary angiography showed complex bifurcation lesion with 95% stenosis proximal LAD with calcification at take-off of large D2. The ostial/proximal D2 also had 95% calcified stenosis. Suspect this lesion played a role in her exertional dyspnea and also in her mildly decreased LV systolic function (EF 40-98% on echo in 7/19).  S/p successful atherectomy with bifurcation stenting of proximal LAD and D2 05/15/18.  Repeat LHC in 3/20 showed nonobstructive disease.  No chest pain.  - Continue ASA 81.  - I will have her continue Plavix for now given complex intervention.  - Continue atorvastatin, good LDL in 9/21.  - Vascepa started with elevated TGs.  Will check lipids today.  2.Pulmonary  hypertension: She appearedto have at least moderate pulmonary hypertension by echo in 7/19 with RV failure. CT chest showed mild emphysema, which should not explain her degree of RV failure. V/Q scan did not show evidence for chronic PEs.Anti-centromere antibody, ANA, and RF were all positive. ?CREST variant of scleroderma.She is on home oxygen.[place] 05/15/18 showed moderate PAH but very high PVR and low cardiac output. This was concerning for advanced pulmonary hypertension.  Repeat echo in 3/21 showed moderately dilated/dysfunctional RV but PASP estimate was lower.  She did not tolerate Uptravi.  Symptomatically, NYHA class II.    - Continue riociguat, now on goal dose.  - Continue Opsumit 10 mg daily.  - Continue orenitram, have been titrating slowly due to symptoms.  She feels like she can continue to increase, will make sure that the medication continues to be titrated up (contact company today).  - Refuses sleep study.  - Continue home oxygen.  - BNP today.  - She wants to wait until next visit for 6 minute walk (says she had to get up too early today).  3. Chronic diastolic CHF/RV failure:  NYHA II.  She is not volume overloaded on exam.  - Continue Lasix 40 qam/20 qpm, BMET today.   4. ?CREST syndrome: Patient saw [facility] Rheumat[facility]. Per her report, they did not think that she had CREST syndrome.     Followup in 2 months.   Loralie Champagne 07/13/2020

## 2020-07-15 DIAGNOSIS — R0902 Hypoxemia: Secondary | ICD-10-CM | POA: Diagnosis not present

## 2020-07-19 ENCOUNTER — Other Ambulatory Visit (HOSPITAL_COMMUNITY): Payer: Self-pay | Admitting: Cardiology

## 2020-08-15 DIAGNOSIS — R0902 Hypoxemia: Secondary | ICD-10-CM | POA: Diagnosis not present

## 2020-08-16 ENCOUNTER — Telehealth (HOSPITAL_COMMUNITY): Payer: Self-pay | Admitting: Pharmacy Technician

## 2020-08-16 ENCOUNTER — Telehealth (HOSPITAL_COMMUNITY): Payer: Self-pay

## 2020-08-16 NOTE — Telephone Encounter (Signed)
Received the provider's portion of Adempas application from [employer].  Sent in application via fax.  Will follow up.

## 2020-08-16 NOTE — Telephone Encounter (Signed)
Patients daughter called wanting to know more information about the adempas manfu. Information, she states its time to renew. Please advise

## 2020-08-17 NOTE — Telephone Encounter (Signed)
Spoke with patient's daughter. She is aware that the provider's portion was sent in. She informed me that there was no RX needed, the company made a mistake when telling her they had no RX on file.

## 2020-08-18 DIAGNOSIS — M1990 Unspecified osteoarthritis, unspecified site: Secondary | ICD-10-CM | POA: Diagnosis not present

## 2020-08-18 DIAGNOSIS — Z79899 Other long term (current) drug therapy: Secondary | ICD-10-CM | POA: Diagnosis not present

## 2020-08-18 DIAGNOSIS — M545 Low back pain, unspecified: Secondary | ICD-10-CM | POA: Diagnosis not present

## 2020-08-18 DIAGNOSIS — G8929 Other chronic pain: Secondary | ICD-10-CM | POA: Diagnosis not present

## 2020-08-18 DIAGNOSIS — I10 Essential (primary) hypertension: Secondary | ICD-10-CM | POA: Diagnosis not present

## 2020-08-18 DIAGNOSIS — R634 Abnormal weight loss: Secondary | ICD-10-CM | POA: Diagnosis not present

## 2020-08-18 DIAGNOSIS — E119 Type 2 diabetes mellitus without complications: Secondary | ICD-10-CM | POA: Diagnosis not present

## 2020-08-18 DIAGNOSIS — N183 Chronic kidney disease, stage 3 unspecified: Secondary | ICD-10-CM | POA: Diagnosis not present

## 2020-08-18 DIAGNOSIS — I2510 Atherosclerotic heart disease of native coronary artery without angina pectoris: Secondary | ICD-10-CM | POA: Diagnosis not present

## 2020-08-18 DIAGNOSIS — I50810 Right heart failure, unspecified: Secondary | ICD-10-CM | POA: Diagnosis not present

## 2020-08-18 DIAGNOSIS — I272 Pulmonary hypertension, unspecified: Secondary | ICD-10-CM | POA: Diagnosis not present

## 2020-08-18 DIAGNOSIS — D649 Anemia, unspecified: Secondary | ICD-10-CM | POA: Diagnosis not present

## 2020-08-18 DIAGNOSIS — K219 Gastro-esophageal reflux disease without esophagitis: Secondary | ICD-10-CM | POA: Diagnosis not present

## 2020-08-18 DIAGNOSIS — J9611 Chronic respiratory failure with hypoxia: Secondary | ICD-10-CM | POA: Diagnosis not present

## 2020-08-22 NOTE — Telephone Encounter (Signed)
Advanced Heart Failure Patient Advocate Encounter   Patient was approved to receive Adempas from [employer] [place]  Patient ID: [id number] Effective dates: 08/18/20 through 08/04/21  Called and updated patient's daughter.  Charlann Boxer, CPhT

## 2020-08-30 ENCOUNTER — Telehealth (HOSPITAL_COMMUNITY): Payer: Self-pay | Admitting: Pharmacy Technician

## 2020-08-30 NOTE — Telephone Encounter (Signed)
Advanced Heart Failure Patient Advocate Encounter   Patient was approved to receive Opsumit from [employer]. They extended the patient's current assistance through 08/04/21.  Patient ID: [id number]  Charlann Boxer, CPhT

## 2020-09-03 ENCOUNTER — Other Ambulatory Visit (HOSPITAL_COMMUNITY): Payer: Self-pay | Admitting: Cardiology

## 2020-09-05 DIAGNOSIS — N289 Disorder of kidney and ureter, unspecified: Secondary | ICD-10-CM | POA: Diagnosis not present

## 2020-09-13 ENCOUNTER — Telehealth (HOSPITAL_COMMUNITY): Payer: Self-pay

## 2020-09-13 NOTE — Telephone Encounter (Deleted)
Advanced Heart Failure Patient Advocate Encounter   Patient was approved to receive Opsumit from [facility]  Patient ID: [id number] Effective dates: 06/12/2020 through 08/04/2021  Spoke with patient regarding assistance.   Penni Homans, Student-PharmD

## 2020-09-15 DIAGNOSIS — R0902 Hypoxemia: Secondary | ICD-10-CM | POA: Diagnosis not present

## 2020-09-18 NOTE — Telephone Encounter (Signed)
Opened in error

## 2020-09-18 NOTE — Telephone Encounter (Signed)
Encounter opened in error.

## 2020-09-27 ENCOUNTER — Ambulatory Visit (HOSPITAL_COMMUNITY)
Admission: RE | Admit: 2020-09-27 | Discharge: 2020-09-27 | Disposition: A | Discharge status: Home / Self Care | Payer: Medicare Other | Source: Ambulatory Visit | Attending: Cardiology | Admitting: Cardiology

## 2020-09-27 ENCOUNTER — Encounter (HOSPITAL_COMMUNITY): Payer: Self-pay | Admitting: Cardiology

## 2020-09-27 ENCOUNTER — Other Ambulatory Visit: Payer: Self-pay

## 2020-09-27 VITALS — BP 140/68 | HR 71 | Wt 133.2 lb

## 2020-09-27 DIAGNOSIS — R0609 Other forms of dyspnea: Secondary | ICD-10-CM

## 2020-09-27 DIAGNOSIS — Z7901 Long term (current) use of anticoagulants: Secondary | ICD-10-CM | POA: Diagnosis not present

## 2020-09-27 DIAGNOSIS — I2510 Atherosclerotic heart disease of native coronary artery without angina pectoris: Secondary | ICD-10-CM | POA: Diagnosis not present

## 2020-09-27 DIAGNOSIS — I498 Other specified cardiac arrhythmias: Secondary | ICD-10-CM | POA: Diagnosis not present

## 2020-09-27 DIAGNOSIS — I50810 Right heart failure, unspecified: Secondary | ICD-10-CM

## 2020-09-27 DIAGNOSIS — I5032 Chronic diastolic (congestive) heart failure: Secondary | ICD-10-CM | POA: Insufficient documentation

## 2020-09-27 DIAGNOSIS — R0600 Dyspnea, unspecified: Secondary | ICD-10-CM

## 2020-09-27 DIAGNOSIS — I110 Hypertensive heart disease with heart failure: Secondary | ICD-10-CM | POA: Insufficient documentation

## 2020-09-27 DIAGNOSIS — I272 Pulmonary hypertension, unspecified: Secondary | ICD-10-CM

## 2020-09-27 DIAGNOSIS — Z7902 Long term (current) use of antithrombotics/antiplatelets: Secondary | ICD-10-CM | POA: Insufficient documentation

## 2020-09-27 DIAGNOSIS — Z79899 Other long term (current) drug therapy: Secondary | ICD-10-CM | POA: Insufficient documentation

## 2020-09-27 DIAGNOSIS — Z87891 Personal history of nicotine dependence: Secondary | ICD-10-CM | POA: Diagnosis not present

## 2020-09-27 DIAGNOSIS — Z9981 Dependence on supplemental oxygen: Secondary | ICD-10-CM | POA: Diagnosis not present

## 2020-09-27 DIAGNOSIS — J439 Emphysema, unspecified: Secondary | ICD-10-CM | POA: Diagnosis not present

## 2020-09-27 DIAGNOSIS — Z955 Presence of coronary angioplasty implant and graft: Secondary | ICD-10-CM | POA: Insufficient documentation

## 2020-09-27 DIAGNOSIS — Z7982 Long term (current) use of aspirin: Secondary | ICD-10-CM | POA: Diagnosis not present

## 2020-09-27 LAB — BASIC METABOLIC PANEL
Anion gap: 10 (ref 5–15)
BUN: 32 mg/dL — ABNORMAL HIGH (ref 8–23)
CO2: 28 mmol/L (ref 22–32)
Calcium: 9.5 mg/dL (ref 8.9–10.3)
Chloride: 99 mmol/L (ref 98–111)
Creatinine, Ser: 1.13 mg/dL — ABNORMAL HIGH (ref 0.44–1.00)
GFR, Estimated: 47 mL/min — ABNORMAL LOW (ref >60)
Glucose, Bld: 177 mg/dL — ABNORMAL HIGH (ref 70–99)
Potassium: 4.6 mmol/L (ref 3.5–5.1)
Sodium: 137 mmol/L (ref 135–145)

## 2020-09-27 LAB — BRAIN NATRIURETIC PEPTIDE: B Natriuretic Peptide: 141.4 pg/mL — ABNORMAL HIGH (ref 0.0–100.0)

## 2020-09-27 NOTE — Patient Instructions (Signed)
EKG & 6 minute walk test done today.  Labs done today. We will contact you only if your labs are abnormal.  No medication changes were made. Please continue all current medications as prescribed.  Your physician recommends that you schedule a follow-up appointment in: 3 months with an echo prior to your exam.  Your physician has requested that you have an echocardiogram. Echocardiography is a painless test that uses sound waves to create images of your heart. It provides your doctor with information about the size and shape of your heart and how well your heart's chambers and valves are working. This procedure takes approximately one hour. There are no restrictions for this procedure.   If you have any questions or concerns before your next appointment please send [place] a message through [place] or call our office at [phone].    TO LEAVE A MESSAGE FOR THE NURSE SELECT OPTION 2, PLEASE LEAVE A MESSAGE INCLUDING: . YOUR NAME . DATE OF BIRTH . CALL BACK NUMBER . REASON FOR CALL**this is important as we prioritize the call backs  YOU WILL RECEIVE A CALL BACK THE SAME DAY AS LONG AS YOU CALL BEFORE 4:00 PM   Do the following things EVERYDAY: 1) Weigh yourself in the morning before breakfast. Write it down and keep it in a log. 2) Take your medicines as prescribed 3) Eat low salt foods--Limit salt (sodium) to 2000 mg per day.  4) Stay as active as you can everyday 5) Limit all fluids for the day to less than 2 liters   At the [facility], you and your health needs are our priority. As part of our continuing mission to provide you with exceptional heart care, we have created designated Provider Care Teams. These Care Teams include your primary Cardiologist (physician) and Advanced Practice Providers (APPs- Physician Assistants and Nurse Practitioners) who all work together to provide you with the care you need, when you need it.   You may see any of the following  providers on your designated Care Team at your next follow up: Marland Kitchen Dr Glori Bickers . Dr Loralie Champagne . Darrick Grinder, NP . Lyda Jester, PA . Audry Riles, PharmD   Please be sure to bring in all your medications bottles to every appointment.

## 2020-09-27 NOTE — Progress Notes (Signed)
6 Min Walk Test Completed  Pt ambulated 152.4 meters O2 Sat ranged 95-97% on 4L oxygen HR ranged 79-111

## 2020-09-28 NOTE — Progress Notes (Signed)
ID:  [name], DOB [birth year], MRN [id number]   Provider location: [facility] Type of Visit: Established patient   PCP:  Joy Bender, PA-C  Cardiologist:  Joy Rouge, MD HF Cardiology: Dr. Aundra [place]   History of Present Illness: [name] is an 87 y.o. with history of DM, HTN, and prior osteomyelitis of jaw referred by Dr. Johnsie Ericks[place] for evaluation of pulmonary hypertension/RV failure.  She reports an episode of PNA in 3/19 and says that she never recovered from this.  She had gradually progressive exertional dyspnea to the point where she was short of breath walking around the house.  She had an echo in 7/19 that showed mildly decreased LV systolic function but severely decreased RV systolic function with dilated RV and at least moderate pulmonary hypertension by echo estimation.  She saw her PCP in 8/19 and was noted to be hypoxemic at rest.  He started her on home oxygen 2 L/[name] which she continues.    She was referred to cardiology, saw Dr. Johnsie [place]. Amlodipine was stopped and she was started empirically on sildenafil 20 mg tid for pulmonary hypertension.   She was referred to CHF clinic.    RHC/LHC was done in 10/19, showing complex severe bifurcation lesion involving the proximal LAD and large D2 ostium. She had complex PCI requiring atherectomy and DES to pLAD and ostial D2. RHC showed PAH with PVR 11.6 WU and CI 1.7.  She subsequently was found to have a right groin pseudoaneurysm from cath.   She was started on Opsumit.  Serologies have suggested CREST syndrome.  However, she saw [name] for rheumatology who told her that he did not think that she had a rheumatologic diagnosis (per her report).   Cardiolite was abnormal in 3/20, this was followed by [facility] in 3/20 that showed nonobstructive CAD.  Echo in 3/20 showed EF 55-60%, with moderately decreased RV systolic function, PASP 64 mmHg.   Echo (3/21) showed EF 55-60%, moderately dilated RV with  moderately decreased systolic function, PASP 38 mmHg.   She returns today for followup of pulmonary hypertension and CAD.  She continues to wear home oxygen 4L [name].  She is now on orenitram, slowly titrating up.  She does not think she can go any higher (had to cut back from last dose due to severe symptoms.  She continues on Opsumit and riociguat at goal dose.  She says that her breathing has overall improved and that the [facility] medication has helped.  6 minute walk today was stable. She can walk on flat ground at a slow pace without dyspnea as long as she is wearing her oxygen.  She is able to do chores around the house.  No lightheadedness.  No chest pain.   ECG (personally reviewed): NSR (sinus arrhythmia), RBBB    6 minute walk (12/19): 79 m 6 minute walk (1/20): 121 m 6 minute walk (2/21): 91 m 6 minute walk (9/21): 152 m 6 minute walk (2/22): 152 m  Labs (12/19): K 3.9, creatinine 0.78 Labs (3/20): K 4, creatinine 1.1, hgb 10.2 Labs (12/20): LDL 67, TGs 28, K 4.4, creatinine 1.04 Labs (2/21): BNP 181, K 4, creatinine 0.94 Labs (5/21): K 4.2, creatinine 0.94, BNP 202 Labs (8/21): hgb 10.4 Labs (9/21): LDL 38, TGs 289 Labs (11/21): K 3.9, creatinine 0.84 Labs (12/21): K 4.9, creatinine 0.98, LDL 48, TGs 209, BNP 197  PMH: 1. H/o osteomyelitis of jaw.  2. Duodenal ulcers with upper GI  bleeding. 3. Type 2 diabetes 4. Hyperlipidemia 5. Squamous cell carcinoma of scalp with radiation and surgery.  6. GERD 7. H/o back surgery  8. HTN 9. Pulmonary hypertension/RV failure: Echo (7/19) with EF 45-50%, moderate focal basal septal hypertrophy, mild diffuse hypokinesis, septal flattening, severe RV dilation with severely decreased systolic function, moderate TR, PASP 61 mmHg.   - CT chest (7/19): Mild emphysema, enlarged PA.  - V/Q scan (8/19): No evidence for chronic PE.  - anti-centromere Ab positive, ANA positive, RF 73 => suspect CREST syndrome.  - RHC (10/19): mean RA 7, PA 64/21 mean  38, mean PCWP 8, CI 1.7, PVR 11.6 WU.  - Echo (3/20): EF 55-60%, moderately decreased RV systolic function, PASP 64 mmHg - Echo (3/21): EF 55-60%, moderately dilated RV with moderately decreased systolic function, PASP 38 mmHg. 10. CAD: LHC (10/19) with 95% proximal LAD and 95% ostial D2.  Complicated bifurcation intervention with atherectomy and DES to proximal LAD and ostial D2.   - LHC (3/20): 40% mLAD, 30% mRCA.  11. Post-cath right groin pseudoaneurysm in 10/19.   SH: Lives in [name], daughter lives very close.  Quit smoking > 20 years ago. No ETOH.   FH: No history of pulmonary hypertension.  Mother with "heart problems."  Sister with PPM.    Review of systems complete and found to be negative unless listed in HPI.   Current Outpatient Medications  Medication Sig Dispense Refill  . ALPRAZolam (XANAX) 0.5 MG tablet Take 0.5 mg by mouth daily as needed for anxiety.     Marland Kitchen aspirin EC 81 MG tablet Take 1 tablet (81 mg total) by mouth daily. 30 tablet 11  . atorvastatin (LIPITOR) 80 MG tablet Take 1 tablet (80 mg total) by mouth daily. 90 tablet 3  . carvedilol (COREG) 3.125 MG tablet TAKE 1 TABLET BY MOUTH TWICE DAILY WITH A MEAL 60 tablet 11  . clopidogrel (PLAVIX) 75 MG tablet Take 1 tablet (75 mg total) by mouth daily. 90 tablet 3  . cyclobenzaprine (FLEXERIL) 10 MG tablet Take 10 mg by mouth at bedtime.    Marland Kitchen FERROUS SULFATE PO Take 40 mg by mouth daily.    . furosemide (LASIX) 40 MG tablet TAKE 1 TABLET BY MOUTH EVERY MORNING AND 1/2 TABLET EVERY EVENING 45 tablet 5  . gabapentin (NEURONTIN) 300 MG capsule Take 300 mg by mouth as needed.    Marland Kitchen icosapent Ethyl (VASCEPA) 1 g capsule Take 2 g by mouth 2 (two) times daily.    Marland Kitchen lactose free nutrition (BOOST) LIQD Take 237 mLs by mouth 2 (two) times daily between meals.     . mirtazapine (REMERON) 30 MG tablet Take 30 mg by mouth at bedtime.    . OPSUMIT 10 MG tablet Take 1 tablet (10 mg total) by mouth daily. 30 tablet 11  .  potassium chloride SA (KLOR-CON) 20 MEQ tablet TAKE 1 TABLET(20 MEQ) BY MOUTH DAILY 30 tablet 6  . Riociguat (ADEMPAS) 2.5 MG TABS Take 2.5 mg by mouth in the morning, at noon, and at bedtime.    . traMADol-acetaminophen (ULTRACET) 37.5-325 MG tablet Take 2 tablets by mouth 4 (four) times daily as needed for pain.  2  . Treprostinil Diolamine ER 0.125 MG TBCR Take 0.125 mg by mouth in the morning, at noon, and at bedtime. 0.25 mg 3x a day     No current facility-administered medications for this encounter.   BP 140/68   Pulse 71   Wt 60.4 kg (  133 lb 3.2 oz)   SpO2 99% Comment: 4l n/c  BMI 23.97 kg/m   Wt Readings from Last 3 Encounters:  09/27/20 60.4 kg (133 lb 3.2 oz)  07/12/20 62 kg (136 lb 9.6 oz)  06/24/20 62.6 kg (138 lb 0.1 oz)   BP 140/68   Pulse 71   Wt 60.4 kg (133 lb 3.2 oz)   SpO2 99% Comment: 4l n/c  BMI 23.97 kg/m  General: NAD Neck: No JVD, no thyromegaly or thyroid nodule.  Lungs: Clear to auscultation bilaterally with normal respiratory effort. CV: Nondisplaced PMI.  Heart regular S1/S2, no S3/S4, 1/6 SEM RUSB.  No peripheral edema.  No carotid bruit.  Normal pedal pulses.  Abdomen: Soft, nontender, no hepatosplenomegaly, no distention.  Skin: Intact without lesions or rashes.  Neurologic: Alert and oriented x 3.  Psych: Normal affect. Extremities: No clubbing or cyanosis.  HEENT: Normal.   Assessment/Plan: 1. CAD: Coronary angiography showed complex bifurcation lesion with 95% stenosis proximal LAD with calcification at take-off of large D2. The ostial/proximal D2 also had 95% calcified stenosis. Suspect this lesion played a role in her exertional dyspnea and also in her mildly decreased LV systolic function (EF 39-53% on echo in 7/19).  S/p successful atherectomy with bifurcation stenting of proximal LAD and D2 05/15/18.  Repeat LHC in 3/20 showed nonobstructive disease.  No chest pain.  - Continue ASA 81.  - I will have her continue Plavix for now given  complex intervention.  - Continue atorvastatin, good LDL in 12/21.  - Continue Vascepa.   2.Pulmonary hypertension: She appearedto have at least moderate pulmonary hypertension by echo in 7/19 with RV failure. CT chest showed mild emphysema, which should not explain her degree of RV failure. V/Q scan did not show evidence for chronic PEs.Anti-centromere antibody, ANA, and RF were all positive. ?CREST variant of scleroderma.She is on home oxygen.[place] 05/15/18 showed moderate PAH but very high PVR and low cardiac output. This was concerning for advanced pulmonary hypertension.  Repeat echo in 3/21 showed moderately dilated/dysfunctional RV but PASP estimate was lower.  She did not tolerate Uptravi.  Symptomatically, NYHA class II.  Stable 6 minute walk today.  - Continue riociguat, now on goal dose.  - Continue Opsumit 10 mg daily.  - Continue orenitram, she thinks that she has titrated up as much as she can tolerate.  - Refuses sleep study.  - Continue home oxygen.  - BNP today.  - Repeat echo at next appointment.   3. Chronic diastolic CHF/RV failure:  NYHA II.  She is not volume overloaded on exam.  - Continue Lasix 40 qam/20 qpm, BMET today.   4. ?CREST syndrome: Patient saw [facility] Rheumat[facility]. Per her report, they did not think that she had CREST syndrome.     Followup in 3 months with echo.    Loralie Champagne 09/28/2020

## 2020-10-13 DIAGNOSIS — R0902 Hypoxemia: Secondary | ICD-10-CM | POA: Diagnosis not present

## 2020-11-05 ENCOUNTER — Other Ambulatory Visit (HOSPITAL_COMMUNITY): Payer: Self-pay | Admitting: Cardiology

## 2020-11-13 DIAGNOSIS — R0902 Hypoxemia: Secondary | ICD-10-CM | POA: Diagnosis not present

## 2020-11-17 DIAGNOSIS — R300 Dysuria: Secondary | ICD-10-CM | POA: Diagnosis not present

## 2020-11-17 DIAGNOSIS — R509 Fever, unspecified: Secondary | ICD-10-CM | POA: Diagnosis not present

## 2020-11-17 DIAGNOSIS — N3090 Cystitis, unspecified without hematuria: Secondary | ICD-10-CM | POA: Diagnosis not present

## 2020-11-18 ENCOUNTER — Emergency Department (HOSPITAL_COMMUNITY): Payer: Medicare Other

## 2020-11-18 ENCOUNTER — Emergency Department (HOSPITAL_COMMUNITY)
Admission: EM | Admit: 2020-11-18 | Discharge: 2020-11-18 | Disposition: A | Discharge status: Home / Self Care | Payer: Medicare Other | Attending: Emergency Medicine | Admitting: Emergency Medicine

## 2020-11-18 ENCOUNTER — Other Ambulatory Visit: Payer: Self-pay

## 2020-11-18 ENCOUNTER — Encounter (HOSPITAL_COMMUNITY): Payer: Self-pay

## 2020-11-18 DIAGNOSIS — N183 Chronic kidney disease, stage 3 unspecified: Secondary | ICD-10-CM | POA: Diagnosis not present

## 2020-11-18 DIAGNOSIS — I129 Hypertensive chronic kidney disease with stage 1 through stage 4 chronic kidney disease, or unspecified chronic kidney disease: Secondary | ICD-10-CM | POA: Diagnosis not present

## 2020-11-18 DIAGNOSIS — Z79899 Other long term (current) drug therapy: Secondary | ICD-10-CM | POA: Diagnosis not present

## 2020-11-18 DIAGNOSIS — E1122 Type 2 diabetes mellitus with diabetic chronic kidney disease: Secondary | ICD-10-CM | POA: Diagnosis not present

## 2020-11-18 DIAGNOSIS — Z87891 Personal history of nicotine dependence: Secondary | ICD-10-CM | POA: Diagnosis not present

## 2020-11-18 DIAGNOSIS — R0981 Nasal congestion: Secondary | ICD-10-CM | POA: Insufficient documentation

## 2020-11-18 DIAGNOSIS — I10 Essential (primary) hypertension: Secondary | ICD-10-CM | POA: Diagnosis not present

## 2020-11-18 DIAGNOSIS — R200 Anesthesia of skin: Secondary | ICD-10-CM | POA: Diagnosis not present

## 2020-11-18 DIAGNOSIS — Z743 Need for continuous supervision: Secondary | ICD-10-CM | POA: Diagnosis not present

## 2020-11-18 DIAGNOSIS — R509 Fever, unspecified: Secondary | ICD-10-CM | POA: Diagnosis not present

## 2020-11-18 DIAGNOSIS — R5383 Other fatigue: Secondary | ICD-10-CM | POA: Diagnosis not present

## 2020-11-18 DIAGNOSIS — Z96653 Presence of artificial knee joint, bilateral: Secondary | ICD-10-CM | POA: Insufficient documentation

## 2020-11-18 DIAGNOSIS — Z7982 Long term (current) use of aspirin: Secondary | ICD-10-CM | POA: Insufficient documentation

## 2020-11-18 DIAGNOSIS — R202 Paresthesia of skin: Secondary | ICD-10-CM | POA: Diagnosis not present

## 2020-11-18 DIAGNOSIS — I639 Cerebral infarction, unspecified: Secondary | ICD-10-CM | POA: Diagnosis not present

## 2020-11-18 DIAGNOSIS — Z85828 Personal history of other malignant neoplasm of skin: Secondary | ICD-10-CM | POA: Insufficient documentation

## 2020-11-18 DIAGNOSIS — Z20822 Contact with and (suspected) exposure to covid-19: Secondary | ICD-10-CM | POA: Diagnosis not present

## 2020-11-18 DIAGNOSIS — R410 Disorientation, unspecified: Secondary | ICD-10-CM | POA: Diagnosis not present

## 2020-11-18 DIAGNOSIS — R531 Weakness: Secondary | ICD-10-CM | POA: Diagnosis not present

## 2020-11-18 DIAGNOSIS — M549 Dorsalgia, unspecified: Secondary | ICD-10-CM | POA: Diagnosis not present

## 2020-11-18 DIAGNOSIS — D649 Anemia, unspecified: Secondary | ICD-10-CM | POA: Insufficient documentation

## 2020-11-18 DIAGNOSIS — R6883 Chills (without fever): Secondary | ICD-10-CM | POA: Diagnosis not present

## 2020-11-18 DIAGNOSIS — I517 Cardiomegaly: Secondary | ICD-10-CM | POA: Diagnosis not present

## 2020-11-18 DIAGNOSIS — I2510 Atherosclerotic heart disease of native coronary artery without angina pectoris: Secondary | ICD-10-CM | POA: Insufficient documentation

## 2020-11-18 DIAGNOSIS — R0902 Hypoxemia: Secondary | ICD-10-CM | POA: Diagnosis not present

## 2020-11-18 DIAGNOSIS — R300 Dysuria: Secondary | ICD-10-CM | POA: Insufficient documentation

## 2020-11-18 DIAGNOSIS — R52 Pain, unspecified: Secondary | ICD-10-CM | POA: Diagnosis not present

## 2020-11-18 DIAGNOSIS — G319 Degenerative disease of nervous system, unspecified: Secondary | ICD-10-CM | POA: Diagnosis not present

## 2020-11-18 LAB — CBC WITH DIFFERENTIAL/PLATELET
Abs Immature Granulocytes: 0.02 10*3/uL (ref 0.00–0.07)
Basophils Absolute: 0 10*3/uL (ref 0.0–0.1)
Basophils Relative: 0 %
Eosinophils Absolute: 0 10*3/uL (ref 0.0–0.5)
Eosinophils Relative: 0 %
HCT: 28 % — ABNORMAL LOW (ref 36.0–46.0)
Hemoglobin: 8.3 g/dL — ABNORMAL LOW (ref 12.0–15.0)
Immature Granulocytes: 0 %
Lymphocytes Relative: 17 %
Lymphs Abs: 1.2 10*3/uL (ref 0.7–4.0)
MCH: 24.9 pg — ABNORMAL LOW (ref 26.0–34.0)
MCHC: 29.6 g/dL — ABNORMAL LOW (ref 30.0–36.0)
MCV: 84.1 fL (ref 80.0–100.0)
Monocytes Absolute: 0.4 10*3/uL (ref 0.1–1.0)
Monocytes Relative: 6 %
Neutro Abs: 5 10*3/uL (ref 1.7–7.7)
Neutrophils Relative %: 77 %
Platelets: 286 10*3/uL (ref 150–400)
RBC: 3.33 MIL/uL — ABNORMAL LOW (ref 3.87–5.11)
RDW: 15 % (ref 11.5–15.5)
WBC: 6.7 10*3/uL (ref 4.0–10.5)
nRBC: 0 % (ref 0.0–0.2)

## 2020-11-18 LAB — COMPREHENSIVE METABOLIC PANEL
ALT: 11 U/L (ref 0–44)
AST: 17 U/L (ref 15–41)
Albumin: 3.2 g/dL — ABNORMAL LOW (ref 3.5–5.0)
Alkaline Phosphatase: 49 U/L (ref 38–126)
Anion gap: 10 (ref 5–15)
BUN: 26 mg/dL — ABNORMAL HIGH (ref 8–23)
CO2: 32 mmol/L (ref 22–32)
Calcium: 11.4 mg/dL — ABNORMAL HIGH (ref 8.9–10.3)
Chloride: 97 mmol/L — ABNORMAL LOW (ref 98–111)
Creatinine, Ser: 0.98 mg/dL (ref 0.44–1.00)
GFR, Estimated: 56 mL/min — ABNORMAL LOW (ref >60)
Glucose, Bld: 129 mg/dL — ABNORMAL HIGH (ref 70–99)
Potassium: 3.2 mmol/L — ABNORMAL LOW (ref 3.5–5.1)
Sodium: 139 mmol/L (ref 135–145)
Total Bilirubin: 0.2 mg/dL — ABNORMAL LOW (ref 0.3–1.2)
Total Protein: 7.4 g/dL (ref 6.5–8.1)

## 2020-11-18 LAB — URINALYSIS, ROUTINE W REFLEX MICROSCOPIC
Bilirubin Urine: NEGATIVE
Glucose, UA: NEGATIVE mg/dL
Hgb urine dipstick: NEGATIVE
Ketones, ur: NEGATIVE mg/dL
Leukocytes,Ua: NEGATIVE
Nitrite: NEGATIVE
Protein, ur: NEGATIVE mg/dL
Specific Gravity, Urine: 1.012 (ref 1.005–1.030)
pH: 6 (ref 5.0–8.0)

## 2020-11-18 LAB — RESP PANEL BY RT-PCR (FLU A&B, COVID) ARPGX2
Influenza A by PCR: NEGATIVE
Influenza B by PCR: NEGATIVE
SARS Coronavirus 2 by RT PCR: NEGATIVE

## 2020-11-18 LAB — BRAIN NATRIURETIC PEPTIDE: B Natriuretic Peptide: 217.7 pg/mL — ABNORMAL HIGH (ref 0.0–100.0)

## 2020-11-18 LAB — LIPASE, BLOOD: Lipase: 37 U/L (ref 11–51)

## 2020-11-18 LAB — AMMONIA: Ammonia: 21 umol/L (ref 9–35)

## 2020-11-18 LAB — TYPE AND SCREEN
ABO/RH(D): O POS
Antibody Screen: NEGATIVE

## 2020-11-18 LAB — TSH: TSH: 0.304 u[IU]/mL — ABNORMAL LOW (ref 0.350–4.500)

## 2020-11-18 MED ORDER — ONDANSETRON HCL 4 MG/2ML IJ SOLN
4.0000 mg | Freq: Once | INTRAMUSCULAR | Status: AC
  Administered 2020-11-18: 4 mg via INTRAVENOUS
  Filled 2020-11-18: qty 2

## 2020-11-18 NOTE — ED Triage Notes (Signed)
Per EMS: PT daughter states increasing weakness x1 week. Lack of appetite, nausea. Urgent care yesterday said anemic and to come to hospital if not feeling better.

## 2020-11-18 NOTE — Discharge Instructions (Signed)
Your work-up today did not show evidence of worsening pneumonia or UTI and your labs did not show evidence of significant dehydration or kidney injury.  We did see some mild electrolyte abnormalities with hypokalemia and hypercalcemia.  Please follow-up with your primary doctor to reassess this in the next several days.  Please also follow-up for repeat check of your hemoglobin given the anemia.  It was not low to a level that needed transfusion at this time.  We did get an MRI that showed possible stroke however they were not fully convinced based on the images.  You are already on aspirin and Plavix and please continue these medications for stroke prevention.  Please follow-up with a neurologist in the next week for reassessment.  Please rest and stay hydrated and continue your antibiotics for the recently diagnosed pneumonia and UTI that we did not see today.  Please rest.  If any symptoms change or worsen, please return to the nearest emergency department immediately.

## 2020-11-18 NOTE — ED Provider Notes (Signed)
[facility] EMERGENCY DEPARTMENT Provider Note   CSN: [id number] Arrival date & time: 11/18/20  1037     History Chief Complaint  Patient presents with  . Weakness    [name] is a 87 y.o. female.  The history is provided by the patient, a relative and medical records. The history is limited by the condition of the patient. No language interpreter was used.  Weakness Severity:  Moderate Onset quality:  Gradual Timing:  Constant Progression:  Unchanged Chronicity:  New Context: dehydration and urinary tract infection   Relieved by:  Nothing Worsened by:  Nothing Ineffective treatments:  None tried Associated symptoms: cough, dysuria, extremity numbness, nausea, stroke symptoms and vomiting   Associated symptoms: no abdominal pain, no anorexia, no aphasia, no chest pain, no diarrhea, no difficulty walking, no dizziness, no falls, no fever, no foul-smelling urine, no frequency, no headaches, no lethargy, no loss of consciousness, no seizures and no shortness of breath        Past Medical History:  Diagnosis Date  . Anxiety   . Arthritis    osteo; "mostly hands, knees, probably back too" (12/09/2016)  . Chronic lower back pain   . Complication of anesthesia 2011   resp distress -on vent after surgery  . Coronary artery disease   . Depression   . Diabetes ([place])   . Esophageal stricture   . GERD (gastroesophageal reflux disease)   . Headache    out grew them  . High cholesterol   . History of blood transfusion 2011; ?03/2012   "related to ORs" (12/09/2016)  . Hypertension   . Intestinal obstruction ([place])   . Macular degeneration of left eye    dx over 55 yrs ago.....hasn't changed much  . Shingles   . Squamous carcinoma 2013   squamas cell on scalp--took 14 radiation tx--2013  . Stroke [facility]) 1967   Mini stroke;  No lasting deficits  . Type II diabetes mellitus ([place]) dx'd 11/2016    Patient Active Problem List   Diagnosis Date Noted  .  Hiatal hernia   . Dysphagia 06/23/2020  . Unstable angina ([place]) 10/06/2018  . Chest pain 10/06/2018  . Febrile illness   . 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 Pulmonary hypertension, unspecified ([place])   . Status post coronary artery stent placement   . Coronary artery disease involving native coronary artery of native heart with angina pectoris ([place])   . Exertional dyspnea 05/14/2018  . Abnormal CT scan, gastrointestinal tract 12/31/2016  . GI bleed 12/31/2016  . HLD (hyperlipidemia) 12/09/2016  . GERD (gastroesophageal reflux disease) 12/09/2016  . Depression 12/09/2016  . History of CVA (cerebrovascular accident) 12/09/2016  . CKD (chronic kidney disease), stage III ([place]) 12/09/2016  . Back pain 12/09/2016  . Sepsis ([place]) 12/09/2016  . History of oral surgery 12/09/2016  . Osteomyelitis of jaw 12/09/2016  . Diabetes mellitus type 2, uncontrolled ([place]) 12/09/2016  . History of Lumbar herniated disc 08/09/2014  . UTI (lower urinary tract infection) 04/08/2012  . Pneumonia 04/04/2012  . Acute blood loss anemia 04/01/2012  . Fall at home 03/31/2012  . History of shingles 03/31/2012  . Anemia 03/31/2012  . Femur fracture, left ([place]) 03/31/2012  . Hypertension 03/30/2012    Past Surgical History:  Procedure Laterality Date  . ABDOMINAL HYSTERECTOMY    . APPENDECTOMY    . BACK SURGERY    . BALLOON DILATION N/A 06/24/2020   Procedure: BALLOON DILATION;  Surgeon:  Clarene Essex, MD;  Location: [facility];  Service: Endoscopy;  Laterality: N/A;  . CATARACT EXTRACTION, BILATERAL Bilateral   . CHOLECYSTECTOMY    . COLON SURGERY    . COLONOSCOPY    . CORONARY ATHERECTOMY N/A 05/15/2018   Procedure: CORONARY ATHERECTOMY;  Surgeon: Leonie Man, MD;  Location: [facility] CV LAB;  Service: Cardiovascular;  Laterality: N/A;  . CORONARY STENT INTERVENTION N/A 05/15/2018   Procedure: CORONARY STENT INTERVENTION;  Surgeon: Leonie Man, MD;  Location: [facility] CV LAB;  Service: Cardiovascular;  Laterality: N/A;  . ESOPHAGOGASTRODUODENOSCOPY N/A 06/24/2020   Procedure: ESOPHAGOGASTRODUODENOSCOPY (EGD);  Surgeon: Clarene Essex, MD;  Location: [facility];  Service: Endoscopy;  Laterality: N/A;  . ESOPHAGOGASTRODUODENOSCOPY (EGD) WITH ESOPHAGEAL DILATION  "several times"  . ESOPHAGOGASTRODUODENOSCOPY (EGD) WITH PROPOFOL N/A 12/31/2016   Procedure: ESOPHAGOGASTRODUODENOSCOPY (EGD) WITH PROPOFOL;  Surgeon: Wilford Corner, MD;  Location: [facility];  Service: Endoscopy;  Laterality: N/A;  . EYE SURGERY    . FRACTURE SURGERY    . IR FLUORO GUIDE CV LINE RIGHT  01/02/2017  . IR US GUIDE VASC ACCESS RIGHT  01/02/2017  . JOINT REPLACEMENT    . LEFT HEART CATH AND CORONARY ANGIOGRAPHY N/A 10/09/2018   Procedure: LEFT HEART CATH AND CORONARY ANGIOGRAPHY;  Surgeon: Larey Dresser, MD;  Location: [facility] CV LAB;  Service: Cardiovascular;  Laterality: N/A;  . LUMBAR DISC SURGERY  10/25/2014   Right L4-L5 removal of seven free fragments of disk mostly posterolaterally.  Lysis of adhesion.  Microscope/notes 10/26/2014  . LUMBAR LAMINECTOMY/DECOMPRESSION MICRODISCECTOMY Right 08/09/2014   Procedure: Right Lumbar Four to Five Microdiskectomy;  Surgeon: Floyce Stakes, MD;  Location: [facility];  Service: Neurosurgery;  Laterality: Right;  Right L4-5 Microdiskectomy  . ORIF PERIPROSTHETIC FRACTURE  03/31/2012   Procedure: OPEN REDUCTION INTERNAL FIXATION (ORIF) PERIPROSTHETIC FRACTURE;  Surgeon: Mauri Pole, MD;  Location: [facility];  Service: Orthopedics;  Laterality: Left;  Open reduction internal fixation Left distal femur periprosthetic fracture  . RIGHT/LEFT HEART CATH AND CORONARY ANGIOGRAPHY N/A 05/14/2018   Procedure: RIGHT/LEFT HEART CATH AND CORONARY ANGIOGRAPHY;  Surgeon: Larey Dresser, MD;  Location: [facility] CV LAB;  Service: Cardiovascular;  Laterality: N/A;  . SMALL INTESTINE SURGERY  2011   "really bad  blockage; went into respiratory distress and was on ventilator after surgery; [facility]"  . SQUAMOUS CELL CARCINOMA EXCISION  ~ 2012   S/P "cut off her head then 15 radiation txs"   . TOTAL KNEE ARTHROPLASTY Bilateral    bilateral     OB History   No obstetric history on file.     History reviewed. No pertinent family history.  Social History   Tobacco Use  . Smoking status: Former Smoker    Packs/day: 0.10    Years: 10.00    Pack years: 1.00    Types: Cigarettes    Quit date: 03/31/1991    Years since quitting: 29.6  . Smokeless tobacco: Never Used  Vaping Use  . Vaping Use: Never used  Substance Use Topics  . Alcohol use: No  . Drug use: No    Home Medications Prior to Admission medications   Medication Sig Start Date End Date Taking? Authorizing Provider  ALPRAZolam Duanne Moron) 0.5 MG tablet Take 0.5 mg by mouth daily as needed for anxiety.  09/14/19   [provider]  aspirin EC 81 MG tablet Take 1 tablet (81 mg total) by mouth daily. 05/16/18   Leanor Kail, PA  atorvastatin (LIPITOR) 80 MG tablet Take 1 tablet (80 mg total) by mouth daily. 11/25/19   Larey Dresser, MD  carvedilol (COREG) 3.125 MG tablet TAKE 1 TABLET BY MOUTH TWICE DAILY WITH A MEAL 11/06/20   Larey Dresser, MD  clopidogrel (PLAVIX) 75 MG tablet Take 1 tablet (75 mg total) by mouth daily. 06/26/20   Wilber Oliphant, MD  cyclobenzaprine (FLEXERIL) 10 MG tablet Take 10 mg by mouth at bedtime.    [provider]  FERROUS SULFATE PO Take 40 mg by mouth daily.    [provider]  furosemide (LASIX) 40 MG tablet TAKE 1 TABLET BY MOUTH EVERY MORNING AND 1/2 TABLET EVERY EVENING 07/19/20   Larey Dresser, MD  gabapentin (NEURONTIN) 300 MG capsule Take 300 mg by mouth as needed.    [provider]  icosapent Ethyl (VASCEPA) 1 g capsule Take 2 g by mouth 2 (two) times daily.    [provider]  lactose free nutrition (BOOST) LIQD Take 237 mLs by mouth 2  (two) times daily between meals.     [provider]  mirtazapine (REMERON) 30 MG tablet Take 30 mg by mouth at bedtime.    [provider]  OPSUMIT 10 MG tablet Take 1 tablet (10 mg total) by mouth daily. 10/14/19   Larey Dresser, MD  potassium chloride SA (KLOR-CON) 20 MEQ tablet TAKE 1 TABLET(20 MEQ) BY MOUTH DAILY 09/04/20   Larey Dresser, MD  Riociguat (ADEMPAS) 2.5 MG TABS Take 2.5 mg by mouth in the morning, at noon, and at bedtime.    [provider]  traMADol-acetaminophen (ULTRACET) 37.5-325 MG tablet Take 2 tablets by mouth 4 (four) times daily as needed for pain. 05/07/18   [provider]  Treprostinil Diolamine ER 0.125 MG TBCR Take 0.125 mg by mouth in the morning, at noon, and at bedtime. 0.25 mg 3x a day    [provider]    Allergies    Sulfa antibiotics  Review of Systems   Review of Systems  Constitutional: Positive for chills and fatigue. Negative for diaphoresis and fever.  HENT: Positive for congestion.   Eyes: Negative for visual disturbance.  Respiratory: Positive for cough. Negative for chest tightness, shortness of breath and wheezing.   Cardiovascular: Negative for chest pain, palpitations and leg swelling.  Gastrointestinal: Positive for nausea and vomiting. Negative for abdominal pain, anorexia, constipation and diarrhea.  Genitourinary: Positive for dysuria. Negative for flank pain and frequency.  Musculoskeletal: Negative for back pain, falls, neck pain and neck stiffness.  Skin: Negative for rash and wound.  Neurological: Positive for weakness. Negative for dizziness, seizures, loss of consciousness, light-headedness and headaches.  Psychiatric/Behavioral: Negative for agitation and confusion.  All other systems reviewed and are negative.   Physical Exam Updated Vital Signs BP (!) 155/64 (BP Location: Right Arm)   Pulse 96   Temp 97.8 F (36.6 C) (Oral)   Resp 18   SpO2 100%   Physical Exam Vitals  and nursing note reviewed.  Constitutional:      General: She is not in acute distress.    Appearance: She is well-developed. She is not ill-appearing, toxic-appearing or diaphoretic.  HENT:     Head: Normocephalic and atraumatic.     Nose: Congestion present.     Mouth/Throat:     Mouth: Mucous membranes are moist.     Pharynx: No oropharyngeal exudate or posterior oropharyngeal erythema.  Eyes:     Extraocular Movements:  Extraocular movements intact.     Conjunctiva/sclera: Conjunctivae normal.     Pupils: Pupils are equal, round, and reactive to light.  Cardiovascular:     Rate and Rhythm: Normal rate and regular rhythm.     Heart sounds: No murmur heard.   Pulmonary:     Effort: Pulmonary effort is normal. No respiratory distress.     Breath sounds: Rhonchi present. No wheezing or rales.  Chest:     Chest wall: No tenderness.  Abdominal:     General: Abdomen is flat.     Palpations: Abdomen is soft.     Tenderness: There is no abdominal tenderness. There is no right CVA tenderness, left CVA tenderness, guarding or rebound.  Musculoskeletal:        General: No tenderness.     Cervical back: Neck supple. No tenderness.     Right lower leg: No edema.     Left lower leg: No edema.  Skin:    General: Skin is warm and dry.     Capillary Refill: Capillary refill takes less than 2 seconds.     Findings: No erythema.  Neurological:     Mental Status: She is alert. She is disoriented.     Sensory: Sensory deficit present.     Motor: No weakness.  Psychiatric:        Mood and Affect: Mood normal.     ED Results / Procedures / Treatments   Labs (all labs ordered are listed, but only abnormal results are displayed) Labs Reviewed  CBC WITH DIFFERENTIAL/PLATELET - Abnormal; Notable for the following components:      Result Value   RBC 3.33 (*)    Hemoglobin 8.3 (*)    HCT 28.0 (*)    MCH 24.9 (*)    MCHC 29.6 (*)    All other components within normal limits   COMPREHENSIVE METABOLIC PANEL - Abnormal; Notable for the following components:   Potassium 3.2 (*)    Chloride 97 (*)    Glucose, Bld 129 (*)    BUN 26 (*)    Calcium 11.4 (*)    Albumin 3.2 (*)    Total Bilirubin 0.2 (*)    GFR, Estimated 56 (*)    All other components within normal limits  TSH - Abnormal; Notable for the following components:   TSH 0.304 (*)    All other components within normal limits  BRAIN NATRIURETIC PEPTIDE - Abnormal; Notable for the following components:   B Natriuretic Peptide 217.7 (*)    All other components within normal limits  CULTURE, BLOOD (ROUTINE X 2)  CULTURE, BLOOD (ROUTINE X 2)  RESP PANEL BY RT-PCR (FLU A&B, COVID) ARPGX2  URINE CULTURE  URINALYSIS, ROUTINE W REFLEX MICROSCOPIC  LIPASE, BLOOD  AMMONIA  LACTIC ACID, PLASMA  LACTIC ACID, PLASMA  TYPE AND SCREEN    EKG None  Radiology DG Chest 2 View  Result Date: 11/18/2020 CLINICAL DATA:  Fever and chills. EXAM: CHEST - 2 VIEW COMPARISON:  June 23, 2020 FINDINGS: Mediastinal contour is normal. The heart size is enlarged. Chronic mild increased pulmonary interstitium is identified bilaterally. There is no focal pneumonia or pleural effusion. The bony structures are stable. IMPRESSION: Cardiomegaly. Chronic mild increased pulmonary interstitium. No focal pneumonia. Electronically Signed   By: Abelardo Diesel M.D.   On: 11/18/2020 12:56   MR BRAIN WO CONTRAST  Result Date: 11/18/2020 CLINICAL DATA:  Neuro deficit, acute, stroke suspected. Additional history provided: New left arm numbness. History of  prior stroke. EXAM: MRI HEAD WITHOUT CONTRAST TECHNIQUE: Multiplanar, multiecho pulse sequences of the brain and surrounding structures were obtained without intravenous contrast. COMPARISON:  Head CT 12/09/2016. FINDINGS: Brain: Mild cerebral and cerebellar atrophy. Apparent punctate acute infarcts within the paramedian left frontal lobe/corpus callosum, anterior right frontal lobe white  matter and left occipital lobe cortex (series 2, images 34, 30, 24). These are appreciated on the axial diffusion-weighted sequence only. Redemonstrated chronic lacunar infarcts within the left corona radiata/basal ganglia and right basal ganglia. Background mild multifocal T2/FLAIR hyperintensity within the cerebral white matter is nonspecific, but compatible with chronic small vessel ischemic disease. Mild chronic small vessel ischemic disease is also present within the pons. No evidence of intracranial mass. No chronic intracranial blood products. No extra-axial fluid collection. No midline shift. Vascular: Expected proximal arterial flow voids. Skull and upper cervical spine: No focal marrow lesion. Sinuses/Orbits: Visualized orbits show no acute finding. Trace bilateral ethmoid sinus mucosal thickening. Left mastoid effusion. IMPRESSION: Apparent punctate acute infarcts within the paramedian left frontal lobe/corpus callosum, anterior right frontal lobe white matter and left occipital lobe cortex. These are appreciated on the axial diffusion-weighted sequence only (not appreciated on the coronal diffusion-weighted sequence), and artifact is also a consideration. Redemonstrated chronic lacunar infarcts within the left corona radiata/basal ganglia and right basal ganglia. Background mild chronic small-vessel ischemic changes within the cerebral white matter and pons. Left mastoid effusion. Electronically Signed   By: Kellie Simmering DO   On: 11/18/2020 15:44    Procedures Procedures   Medications Ordered in ED Medications  ondansetron [facility]) injection 4 mg (4 mg Intravenous Given 11/18/20 1323)    ED Course  I have reviewed the triage vital signs and the nursing notes.  Pertinent labs & imaging results that were available during my care of the patient were reviewed by me and considered in my medical decision making (see chart for details).    MDM Rules/Calculators/A&P                           [name] is a 87 y.o. female with a past medical history significant for prior stroke with no residual symptoms, CKD, hypertension, hyperlipidemia, chronic back pain, GERD, and recent diagnosis of pneumonia and UTI who was told to come back to emergency department for concerning lab values as well as the patient developing intermittent hallucinations.  According to patient and family, for the last week or so, she has been having symptoms of some dry cough and fatigue.  She has not been eating or drinking.  She went to urgent care yesterday and was started on antibiotics because they told her she had a urinary tract infection.  They ordered an x-ray to look for pneumonia given her cough but it was not completed at that time.   they reportedly called her today when her labs and imaging returned showing evidence of pneumonia and anemia of some degree instructed her to seek evaluation and likely admission for management.  Patient says that she has been more confused and fatigued and has had some dysuria persistence.  She does report a dry cough but is not getting up any phlegm.  She denies any chest pain or shortness of breath however and she always take oxygen at home.  She denies any chest pain or abdominal pain.  She is accompanied by her daughter who says that the patient does live alone although family does live close.  Importantly, this  week patient has been talking to people that are dead and acting intermittently delirious with her confusion.  They were concerned about this.  They do say that today patient is subjectively starting to feel better with less confusion but they are concerned about the infections and her overall appearance.  On exam, lungs have mild rhonchi bilateral in the bases and chest and back are nontender.  Abdomen nontender with normal bowel sounds.  Patient has chronic numbness in the left leg she reports from previous surgery but she had a new left arm numbness which she  reports is new.  Patient cannot tell me when this started but family thinks it was recently even this week.  She is denying any headache, speech difficulties, or vision changes.  She was slightly disoriented to time and family still think she is slightly confused.  Clinically I am concerned that she is having an delirium in the setting of UTI and pneumonia.  With this new left arm numbness and history of stroke, I am primarily worried about recrudescence of previous stroke in the setting of infection however as she does not know when the started, will get MRI to rule out acute stroke.  We will get screening labs to confirm if she needs blood transfusion and other electrolyte supplementation.  Anticipate shared decision-making conversation with patient and family about disposition when work-up is completed  Work-up is returned and is overall reassuring.  No evidence of urinary tract infection or pneumonia.  Metabolic panel does show some mild hypokalemia and hypercalcemia but otherwise there is mild anemia of 8.3.  She denies any sources of bleeding at this time and denies feeling lightheaded currently.  She does not know what her hemoglobin was yesterday.   MRI also returned showing a possible small punctate strokes however they also commented could be artifact.  I had a long shared decision-making conversation with patient and daughter about plan.  We discussed the possibly of admission due to the hallucinations, mild confusion, and currently being treated with antibiotics for UTI and pneumonia as well as possible stroke.  Patient says that she is feeling better and wants to go home.  She does not want to be admitted given the otherwise reassuring work-up.  We discussed consulting neurology but she would rather follow-up with an outpatient neurologist.  She will continue to take home aspirin and Plavix.  She would like to go home for [place] tomorrow.  Patient will be discharged with plans to follow-up  with PCP in several days as well as get follow-up with neurology.  She understands extremely strict return precautions and follow-up instructions.  They had no other questions or concerns and was discharged in good condition.   Final Clinical Impression(s) / ED Diagnoses Final diagnoses:  Fatigue, unspecified type  Anemia, unspecified type  Numbness and tingling in left arm    Rx / DC Orders ED Discharge Orders    None      Clinical Impression: 1. Fatigue, unspecified type   2. Anemia, unspecified type   3. Numbness and tingling in left arm     Disposition: Discharge  Condition: Good  I have discussed the results, Dx and Tx plan with the pt(& family if present). He/she/they expressed understanding and agree(s) with the plan. Discharge instructions discussed at great length. Strict return precautions discussed and pt &/or family have verbalized understanding of the instructions. No further questions at time of discharge.    New Prescriptions   No medications on file  Follow Up: [facility] [address]     [address] [place] [address] [phone]    [name], [place] [place] [address] [phone]     [facility] EMERGENCY DEPARTMENT [address] [id number] mc [place]        Kahmya Pinkham, Gwenyth Allegra, MD 11/18/20 [phone]

## 2020-11-19 LAB — URINE CULTURE: Culture: NO GROWTH

## 2020-11-23 DIAGNOSIS — Z79899 Other long term (current) drug therapy: Secondary | ICD-10-CM | POA: Diagnosis not present

## 2020-11-23 DIAGNOSIS — J9611 Chronic respiratory failure with hypoxia: Secondary | ICD-10-CM | POA: Diagnosis not present

## 2020-11-23 DIAGNOSIS — I50810 Right heart failure, unspecified: Secondary | ICD-10-CM | POA: Diagnosis not present

## 2020-11-23 DIAGNOSIS — R5383 Other fatigue: Secondary | ICD-10-CM | POA: Diagnosis not present

## 2020-11-23 DIAGNOSIS — I272 Pulmonary hypertension, unspecified: Secondary | ICD-10-CM | POA: Diagnosis not present

## 2020-11-23 DIAGNOSIS — N183 Chronic kidney disease, stage 3 unspecified: Secondary | ICD-10-CM | POA: Diagnosis not present

## 2020-11-23 DIAGNOSIS — D638 Anemia in other chronic diseases classified elsewhere: Secondary | ICD-10-CM | POA: Diagnosis not present

## 2020-11-23 DIAGNOSIS — Z6823 Body mass index (BMI) 23.0-23.9, adult: Secondary | ICD-10-CM | POA: Diagnosis not present

## 2020-11-23 LAB — CULTURE, BLOOD (ROUTINE X 2)
Culture: NO GROWTH
Culture: NO GROWTH
Special Requests: ADEQUATE
Special Requests: ADEQUATE

## 2020-11-30 DIAGNOSIS — E875 Hyperkalemia: Secondary | ICD-10-CM | POA: Diagnosis not present

## 2020-11-30 DIAGNOSIS — D638 Anemia in other chronic diseases classified elsewhere: Secondary | ICD-10-CM | POA: Diagnosis not present

## 2020-12-06 ENCOUNTER — Other Ambulatory Visit (HOSPITAL_COMMUNITY): Payer: Self-pay

## 2020-12-06 MED ORDER — CLOPIDOGREL BISULFATE 75 MG PO TABS: 75.0000 mg | ORAL_TABLET | Freq: Every day | ORAL | 3 refills | Status: DC

## 2020-12-06 MED ORDER — ATORVASTATIN CALCIUM 80 MG PO TABS: 80.0000 mg | ORAL_TABLET | Freq: Every day | ORAL | 3 refills | Status: AC

## 2020-12-07 DIAGNOSIS — D638 Anemia in other chronic diseases classified elsewhere: Secondary | ICD-10-CM | POA: Diagnosis not present

## 2020-12-08 ENCOUNTER — Telehealth: Payer: Self-pay | Admitting: Hematology

## 2020-12-08 NOTE — Telephone Encounter (Signed)
Patient's daughter and referring PCP decided to have patient come see him one more time and then if Labs are still abnormal will referred here

## 2020-12-11 DIAGNOSIS — Z1212 Encounter for screening for malignant neoplasm of rectum: Secondary | ICD-10-CM | POA: Diagnosis not present

## 2020-12-12 ENCOUNTER — Inpatient Hospital Stay: Payer: Medicare Other | Admitting: Hematology

## 2020-12-13 DIAGNOSIS — R0902 Hypoxemia: Secondary | ICD-10-CM | POA: Diagnosis not present

## 2020-12-26 DIAGNOSIS — M545 Low back pain, unspecified: Secondary | ICD-10-CM | POA: Diagnosis not present

## 2020-12-26 DIAGNOSIS — G8929 Other chronic pain: Secondary | ICD-10-CM | POA: Diagnosis not present

## 2020-12-26 DIAGNOSIS — D649 Anemia, unspecified: Secondary | ICD-10-CM | POA: Diagnosis not present

## 2020-12-26 DIAGNOSIS — K219 Gastro-esophageal reflux disease without esophagitis: Secondary | ICD-10-CM | POA: Diagnosis not present

## 2020-12-26 DIAGNOSIS — I2510 Atherosclerotic heart disease of native coronary artery without angina pectoris: Secondary | ICD-10-CM | POA: Diagnosis not present

## 2020-12-26 DIAGNOSIS — N183 Chronic kidney disease, stage 3 unspecified: Secondary | ICD-10-CM | POA: Diagnosis not present

## 2020-12-26 DIAGNOSIS — E119 Type 2 diabetes mellitus without complications: Secondary | ICD-10-CM | POA: Diagnosis not present

## 2020-12-26 DIAGNOSIS — I272 Pulmonary hypertension, unspecified: Secondary | ICD-10-CM | POA: Diagnosis not present

## 2020-12-26 DIAGNOSIS — Z79899 Other long term (current) drug therapy: Secondary | ICD-10-CM | POA: Diagnosis not present

## 2020-12-26 DIAGNOSIS — I10 Essential (primary) hypertension: Secondary | ICD-10-CM | POA: Diagnosis not present

## 2020-12-26 DIAGNOSIS — J9611 Chronic respiratory failure with hypoxia: Secondary | ICD-10-CM | POA: Diagnosis not present

## 2020-12-26 DIAGNOSIS — M1990 Unspecified osteoarthritis, unspecified site: Secondary | ICD-10-CM | POA: Diagnosis not present

## 2020-12-26 DIAGNOSIS — I50810 Right heart failure, unspecified: Secondary | ICD-10-CM | POA: Diagnosis not present

## 2020-12-29 ENCOUNTER — Other Ambulatory Visit: Payer: Self-pay

## 2020-12-29 ENCOUNTER — Ambulatory Visit (HOSPITAL_BASED_OUTPATIENT_CLINIC_OR_DEPARTMENT_OTHER)
Admission: RE | Admit: 2020-12-29 | Discharge: 2020-12-29 | Disposition: A | Discharge status: Home / Self Care | Payer: Medicare Other | Source: Ambulatory Visit | Attending: Cardiology | Admitting: Cardiology

## 2020-12-29 ENCOUNTER — Ambulatory Visit (HOSPITAL_COMMUNITY)
Admission: RE | Admit: 2020-12-29 | Discharge: 2020-12-29 | Disposition: A | Discharge status: Home / Self Care | Payer: Medicare Other | Source: Ambulatory Visit | Attending: Cardiology | Admitting: Cardiology

## 2020-12-29 ENCOUNTER — Encounter (HOSPITAL_COMMUNITY): Payer: Self-pay | Admitting: Cardiology

## 2020-12-29 VITALS — BP 122/80 | HR 87 | Wt 130.4 lb

## 2020-12-29 DIAGNOSIS — Z955 Presence of coronary angioplasty implant and graft: Secondary | ICD-10-CM | POA: Diagnosis not present

## 2020-12-29 DIAGNOSIS — I700 Atherosclerosis of aorta: Secondary | ICD-10-CM | POA: Diagnosis not present

## 2020-12-29 DIAGNOSIS — J439 Emphysema, unspecified: Secondary | ICD-10-CM | POA: Insufficient documentation

## 2020-12-29 DIAGNOSIS — Z87891 Personal history of nicotine dependence: Secondary | ICD-10-CM | POA: Diagnosis not present

## 2020-12-29 DIAGNOSIS — I5032 Chronic diastolic (congestive) heart failure: Secondary | ICD-10-CM | POA: Diagnosis not present

## 2020-12-29 DIAGNOSIS — I50810 Right heart failure, unspecified: Secondary | ICD-10-CM | POA: Diagnosis not present

## 2020-12-29 DIAGNOSIS — E785 Hyperlipidemia, unspecified: Secondary | ICD-10-CM | POA: Insufficient documentation

## 2020-12-29 DIAGNOSIS — E119 Type 2 diabetes mellitus without complications: Secondary | ICD-10-CM | POA: Insufficient documentation

## 2020-12-29 DIAGNOSIS — I110 Hypertensive heart disease with heart failure: Secondary | ICD-10-CM | POA: Insufficient documentation

## 2020-12-29 DIAGNOSIS — I2510 Atherosclerotic heart disease of native coronary artery without angina pectoris: Secondary | ICD-10-CM | POA: Diagnosis not present

## 2020-12-29 DIAGNOSIS — Z7982 Long term (current) use of aspirin: Secondary | ICD-10-CM | POA: Diagnosis not present

## 2020-12-29 DIAGNOSIS — K219 Gastro-esophageal reflux disease without esophagitis: Secondary | ICD-10-CM | POA: Insufficient documentation

## 2020-12-29 DIAGNOSIS — I272 Pulmonary hypertension, unspecified: Secondary | ICD-10-CM | POA: Insufficient documentation

## 2020-12-29 DIAGNOSIS — D509 Iron deficiency anemia, unspecified: Secondary | ICD-10-CM | POA: Diagnosis not present

## 2020-12-29 DIAGNOSIS — Z79899 Other long term (current) drug therapy: Secondary | ICD-10-CM | POA: Insufficient documentation

## 2020-12-29 DIAGNOSIS — Z9981 Dependence on supplemental oxygen: Secondary | ICD-10-CM | POA: Diagnosis not present

## 2020-12-29 DIAGNOSIS — D649 Anemia, unspecified: Secondary | ICD-10-CM | POA: Insufficient documentation

## 2020-12-29 HISTORY — DX: Anemia, unspecified: D64.9

## 2020-12-29 LAB — ECHOCARDIOGRAM COMPLETE
Area-P 1/2: 3.13 cm2
S' Lateral: 2.9 cm

## 2020-12-29 LAB — BASIC METABOLIC PANEL
Anion gap: 8 (ref 5–15)
BUN: 21 mg/dL (ref 8–23)
CO2: 32 mmol/L (ref 22–32)
Calcium: 9.2 mg/dL (ref 8.9–10.3)
Chloride: 98 mmol/L (ref 98–111)
Creatinine, Ser: 0.96 mg/dL (ref 0.44–1.00)
GFR, Estimated: 57 mL/min — ABNORMAL LOW (ref >60)
Glucose, Bld: 101 mg/dL — ABNORMAL HIGH (ref 70–99)
Potassium: 3.9 mmol/L (ref 3.5–5.1)
Sodium: 138 mmol/L (ref 135–145)

## 2020-12-29 LAB — IRON AND TIBC
Iron: 24 ug/dL — ABNORMAL LOW (ref 28–170)
Saturation Ratios: 6 % — ABNORMAL LOW (ref 10.4–31.8)
TIBC: 406 ug/dL (ref 250–450)
UIBC: 382 ug/dL

## 2020-12-29 LAB — FERRITIN: Ferritin: 17 ng/mL (ref 11–307)

## 2020-12-29 LAB — BRAIN NATRIURETIC PEPTIDE: B Natriuretic Peptide: 358.7 pg/mL — ABNORMAL HIGH (ref 0.0–100.0)

## 2020-12-29 MED ORDER — FUROSEMIDE 40 MG PO TABS
40.0000 mg | ORAL_TABLET | Freq: Two times a day (BID) | ORAL | 3 refills | Status: DC
Start: 2020-12-29 — End: 2021-01-05

## 2020-12-29 NOTE — Patient Instructions (Signed)
6 minute walk test done today.  Labs done today. We will contact you only if your labs are abnormal.  INCREASE Lasix to 108m (1 tablet) by mouth 2 times daily.  No other medication changes were made. Please continue all current medications as prescribed.  Your physician recommends that you schedule a follow-up appointment in: 10 days for a lab only appointment(can be done at your pcp;a paper prescription was giving to you during todays visit) and in 3 months with Dr. MAundra Dubin  If you have any questions or concerns before your next appointment please send [place] message through mPerkinsor call our office at [phone]    TO LEAVE A MESSAGE FOR THE NURSE SELECT OPTION 2, PLEASE LEAVE A MESSAGE INCLUDING: . YOUR NAME . DATE OF BIRTH . CALL BACK NUMBER . REASON FOR CALL**this is important as we prioritize the call backs  YOU WILL RECEIVE A CALL BACK THE SAME DAY AS LONG AS YOU CALL BEFORE 4:00 PM   Do the following things EVERYDAY: 1) Weigh yourself in the morning before breakfast. Write it down and keep it in a log. 2) Take your medicines as prescribed 3) Eat low salt foods--Limit salt (sodium) to 2000 mg per day.  4) Stay as active as you can everyday 5) Limit all fluids for the day to less than 2 liters   [facility] you and your health needs are our priority. As part of our continuing mission to provide you with exceptional heart care, we have created designated Provider Care Teams. These Care Teams include your primary Cardiologist (physician) and Advanced Practice Providers (APPs- Physician Assistants and Nurse Practitioners) who all work together to provide you with the care you need, when you need it.   You may see any of the following providers on your designated Care Team at your next follow up: .Marland KitchenDr DGlori Bickers. Dr DLoralie Champagne. ADarrick Grinder NP . BLyda Jester PA . LAudry Riles PharmD   Please be sure to bring in all your medications  bottles to every appointment.

## 2020-12-29 NOTE — Progress Notes (Signed)
  Echocardiogram 2D Echocardiogram has been performed.  [name] G [name] 12/29/2020, 1:56 PM

## 2020-12-29 NOTE — Progress Notes (Signed)
6 Min Walk Test Completed  Pt ambulated 259.08 meters O2 Sat ranged 93%-95% on 4L oxygen HR ranged 86-112

## 2020-12-31 NOTE — Progress Notes (Signed)
ID:  [name], DOB 1932-02-05, MRN [id number]   Provider location: [facility] Type of Visit: Established patient   PCP:  [name], PA-C  Cardiologist:  Jenkins Rouge, MD HF Cardiology: Dr. Aundra Dubin   History of Present Illness: [name] is an 87 y.o. with history of DM, HTN, and prior osteomyelitis of jaw referred by Dr. Johnsie Cancel for evaluation of pulmonary hypertension/RV failure.  She reports an episode of PNA in 3/19 and says that she never recovered from this.  She had gradually progressive exertional dyspnea to the point where she was short of breath walking around the house.  She had an echo in 7/19 that showed mildly decreased LV systolic function but severely decreased RV systolic function with dilated RV and at least moderate pulmonary hypertension by echo estimation.  She saw her PCP in 8/19 and was noted to be hypoxemic at rest.  He started her on home oxygen 2 L/[place] which she continues.    She was referred to cardiology, saw Dr. Johnsie Cancel. Amlodipine was stopped and she was started empirically on sildenafil 20 mg tid for pulmonary hypertension.   She was referred to CHF clinic.    RHC/LHC was done in 10/19, showing complex severe bifurcation lesion involving the proximal LAD and large D2 ostium. She had complex PCI requiring atherectomy and DES to pLAD and ostial D2. RHC showed PAH with PVR 11.6 WU and CI 1.7.  She subsequently was found to have a right groin pseudoaneurysm from cath.   She was started on Opsumit.  Serologies have suggested CREST syndrome.  However, she saw Dr. Amil Amen for rheumatology who told her that he did not think that she had a rheumatologic diagnosis (per her report).   Cardiolite was abnormal in 3/20, this was followed by [facility] in 3/20 that showed nonobstructive CAD.  Echo in 3/20 showed EF 55-60%, with moderately decreased RV systolic function, PASP 64 mmHg.   Echo (3/21) showed EF 55-60%, moderately dilated RV with  moderately decreased systolic function, PASP 38 mmHg. Echo was done today and reviewed, EF 55-60%, basal inferior akinesis, mildly dilated RV with moderately decreased systolic function, D-shaped septum.   She returns today for followup of pulmonary hypertension and CAD.  She continues to wear home oxygen 4L [place].  She is short of breath with moderate exertion.  No problems walking around her house. No problems getting dressed, mild dyspnea taking a shower without oxygen.  6 minute walk improved today.  Weight down 3 lbs. Now off Plavix with anemia.      6 minute walk (12/19): 79 m 6 minute walk (1/20): 121 m 6 minute walk (2/21): 91 m 6 minute walk (9/21): 152 m 6 minute walk (2/22): 152 m 6 minute walk (5/22): 259 m  Labs (12/19): K 3.9, creatinine 0.78 Labs (3/20): K 4, creatinine 1.1, hgb 10.2 Labs (12/20): LDL 67, TGs 28, K 4.4, creatinine 1.04 Labs (2/21): BNP 181, K 4, creatinine 0.94 Labs (5/21): K 4.2, creatinine 0.94, BNP 202 Labs (8/21): hgb 10.4 Labs (9/21): LDL 38, TGs 289 Labs (11/21): K 3.9, creatinine 0.84 Labs (12/21): K 4.9, creatinine 0.98, LDL 48, TGs 209, BNP 197 Labs (4/22): BNP 218, K 3.2, creatinine 0.98, hgb 8.3  PMH: 1. H/o osteomyelitis of jaw.  2. Duodenal ulcers with upper GI bleeding. 3. Type 2 diabetes 4. Hyperlipidemia 5. Squamous cell carcinoma of scalp with radiation and surgery.  6. GERD 7. H/o back surgery  8. HTN  9. Pulmonary hypertension/RV failure: Echo (7/19) with EF 45-50%, moderate focal basal septal hypertrophy, mild diffuse hypokinesis, septal flattening, severe RV dilation with severely decreased systolic function, moderate TR, PASP 61 mmHg.   - CT chest (7/19): Mild emphysema, enlarged PA.  - V/Q scan (8/19): No evidence for chronic PE.  - anti-centromere Ab positive, ANA positive, RF 73 => suspect CREST syndrome.  - RHC (10/19): mean RA 7, PA 64/21 mean 38, mean PCWP 8, CI 1.7, PVR 11.6 WU.  - Echo (3/20): EF 55-60%, moderately  decreased RV systolic function, PASP 64 mmHg - Echo (3/21): EF 55-60%, moderately dilated RV with moderately decreased systolic function, PASP 38 mmHg. - Echo (5/22): EF 55-60%, basal inferior akinesis, mildly dilated RV with moderately decreased systolic function, D-shaped septum. 10. CAD: LHC (10/19) with 95% proximal LAD and 95% ostial D2.  Complicated bifurcation intervention with atherectomy and DES to proximal LAD and ostial D2.   - LHC (3/20): 40% mLAD, 30% mRCA.  11. Post-cath right groin pseudoaneurysm in 10/19.  12. Anemia  SH: Lives in [place] alone, daughter lives very close.  Quit smoking > 20 years ago. No ETOH.   FH: No history of pulmonary hypertension.  Mother with "heart problems."  Sister with PPM.    Review of systems complete and found to be negative unless listed in HPI.   Current Outpatient Medications  Medication Sig Dispense Refill  . ALPRAZolam (XANAX) 0.5 MG tablet Take 0.5 mg by mouth daily as needed for anxiety.     Marland Kitchen aspirin EC 81 MG tablet Take 1 tablet (81 mg total) by mouth daily. 30 tablet 11  . atorvastatin (LIPITOR) 80 MG tablet Take 1 tablet (80 mg total) by mouth daily. 90 tablet 3  . carvedilol (COREG) 3.125 MG tablet TAKE 1 TABLET BY MOUTH TWICE DAILY WITH A MEAL 60 tablet 11  . cyclobenzaprine (FLEXERIL) 10 MG tablet Take 10 mg by mouth at bedtime.    Marland Kitchen FERROUS SULFATE PO Take 40 mg by mouth daily.    Marland Kitchen gabapentin (NEURONTIN) 300 MG capsule Take 300 mg by mouth as needed.    Marland Kitchen icosapent Ethyl (VASCEPA) 1 g capsule Take 2 g by mouth 2 (two) times daily.    Marland Kitchen lactose free nutrition (BOOST) LIQD Take 237 mLs by mouth 2 (two) times daily between meals.     . mirtazapine (REMERON) 30 MG tablet Take 30 mg by mouth at bedtime.    . OPSUMIT 10 MG tablet Take 1 tablet (10 mg total) by mouth daily. 30 tablet 11  . potassium chloride SA (KLOR-CON) 20 MEQ tablet TAKE 1 TABLET(20 MEQ) BY MOUTH DAILY 30 tablet 6  . Riociguat (ADEMPAS) 2.5 MG TABS Take 2.5 mg by  mouth in the morning, at noon, and at bedtime.    . traMADol-acetaminophen (ULTRACET) 37.5-325 MG tablet Take 2 tablets by mouth 4 (four) times daily as needed for pain.  2  . Treprostinil Diolamine ER 0.125 MG TBCR Take 0.125 mg by mouth in the morning, at noon, and at bedtime. 0.25 mg 3x a day    . furosemide (LASIX) 40 MG tablet Take 1 tablet (40 mg total) by mouth 2 (two) times daily. 180 tablet 3   No current facility-administered medications for this encounter.   BP 122/80   Pulse 87   Wt 59.1 kg (130 lb 6.4 oz)   SpO2 100% Comment: 4 L N/C  BMI 23.47 kg/m   Wt Readings from Last 3 Encounters:  12/29/20  59.1 kg (130 lb 6.4 oz)  09/27/20 60.4 kg (133 lb 3.2 oz)  07/12/20 62 kg (136 lb 9.6 oz)   BP 122/80   Pulse 87   Wt 59.1 kg (130 lb 6.4 oz)   SpO2 100% Comment: 4 L N/C  BMI 23.47 kg/m  General: NAD Neck: JVP 8 cm with HJR, no thyromegaly or thyroid nodule.  Lungs: Clear to auscultation bilaterally with normal respiratory effort. CV: Nondisplaced PMI.  Heart regular S1/S2, no S3/S4, no murmur.  No peripheral edema.  No carotid bruit.  Normal pedal pulses.  Abdomen: Soft, nontender, no hepatosplenomegaly, no distention.  Skin: Intact without lesions or rashes.  Neurologic: Alert and oriented x 3.  Psych: Normal affect. Extremities: No clubbing or cyanosis.  HEENT: Normal.   Assessment/Plan: 1. CAD: Coronary angiography showed complex bifurcation lesion with 95% stenosis proximal LAD with calcification at take-off of large D2. The ostial/proximal D2 also had 95% calcified stenosis. Suspect this lesion played a role in her exertional dyspnea and also in her mildly decreased LV systolic function (EF 88-91% on echo in 7/19).  S/p successful atherectomy with bifurcation stenting of proximal LAD and D2 05/15/18.  Repeat LHC in 3/20 showed nonobstructive disease.  No chest pain.  - Continue ASA 81.  - Plavix stopped with anemia.   - Continue atorvastatin, good LDL in 12/21.  -  Continue Vascepa.   2.Pulmonary hypertension: She appearedto have at least moderate pulmonary hypertension by echo in 7/19 with RV failure. CT chest showed mild emphysema, which should not explain her degree of RV failure. V/Q scan did not show evidence for chronic PEs.Anti-centromere antibody, ANA, and RF were all positive. ?CREST variant of scleroderma.She is on home oxygen.[place] 05/15/18 showed moderate PAH but very high PVR and low cardiac output. This was concerning for advanced pulmonary hypertension.  Repeat echo in 3/21 showed moderately dilated/dysfunctional RV but PASP estimate was lower.  Echo in 5/22 showed mildly dilated/moderately dysfunctional RV, TR doppler jet not complete so probably not accurate PA pressure estimate. . She did not tolerate Uptravi.  Symptomatically, NYHA class III.  Improved 6 minute walk today.  - Continue riociguat, now on goal dose.  - Continue Opsumit 10 mg daily.  - Continue orenitram, I will try to have her titrate this up again.  - Refused sleep study.  - Continue home oxygen.  - BMET/BNP today.   3. Chronic diastolic CHF/RV failure:  NYHA III.  Even though weight is down, she appears mildly volume overloaded.  - Increase Lasix to 40 mg bid, BMET today and again in 10 days.   4. ?CREST syndrome: Patient saw [facility] Rheumatology. Per her report, they did not think that she had CREST syndrome.    5. Anemia: Uncertain etiology, Opsumit could play a role in this.  No overt GI bleeding.  - Check Fe studies.   Followup in 3 months   Loralie Champagne 12/31/2020

## 2021-01-05 ENCOUNTER — Other Ambulatory Visit (HOSPITAL_COMMUNITY): Payer: Self-pay | Admitting: Cardiology

## 2021-01-08 ENCOUNTER — Telehealth (HOSPITAL_COMMUNITY): Payer: Self-pay | Admitting: *Deleted

## 2021-01-08 ENCOUNTER — Other Ambulatory Visit (HOSPITAL_COMMUNITY): Payer: Self-pay

## 2021-01-08 DIAGNOSIS — I50810 Right heart failure, unspecified: Secondary | ICD-10-CM

## 2021-01-08 DIAGNOSIS — D509 Iron deficiency anemia, unspecified: Secondary | ICD-10-CM

## 2021-01-08 NOTE — Telephone Encounter (Signed)
-----  Message from Larey Dresser, MD sent at 12/31/2020  9:58 PM EDT ----- Please have her try to titrate up orenitram again slowly.  Will need to have the nurse from the drug company coordinate this.

## 2021-01-08 NOTE — Telephone Encounter (Signed)
Order sent to [place] to work with pt to titrate med

## 2021-01-09 ENCOUNTER — Telehealth (HOSPITAL_COMMUNITY): Payer: Self-pay

## 2021-01-09 NOTE — Telephone Encounter (Signed)
Larey Dresser, MD  12/31/2020 10:13 PM EDT      Low transferrin saturation, arrange for Feraheme infusion   Spoke with patient yesterday, discuss results of iron panel.  Advised Dr Aundra Dubin recommends iron (feraheme) infusion.  Attempted to call Medical infusion department yesterday, unable to get through. Called this morning, pt scheduled for Faraheme infusion 1 of 2 on June 14th, 10am.  Pt made aware of same. Verbalized understanding.

## 2021-01-12 DIAGNOSIS — D638 Anemia in other chronic diseases classified elsewhere: Secondary | ICD-10-CM | POA: Diagnosis not present

## 2021-01-12 DIAGNOSIS — N183 Chronic kidney disease, stage 3 unspecified: Secondary | ICD-10-CM | POA: Diagnosis not present

## 2021-01-13 DIAGNOSIS — R0902 Hypoxemia: Secondary | ICD-10-CM | POA: Diagnosis not present

## 2021-01-16 ENCOUNTER — Other Ambulatory Visit: Payer: Self-pay

## 2021-01-16 ENCOUNTER — Encounter (HOSPITAL_COMMUNITY)
Admission: RE | Admit: 2021-01-16 | Discharge: 2021-01-16 | Disposition: A | Discharge status: Home / Self Care | Payer: Medicare Other | Source: Ambulatory Visit | Attending: Cardiology | Admitting: Cardiology

## 2021-01-16 DIAGNOSIS — D509 Iron deficiency anemia, unspecified: Secondary | ICD-10-CM | POA: Insufficient documentation

## 2021-01-16 MED ORDER — SODIUM CHLORIDE 0.9 % IV SOLN
510.0000 mg | INTRAVENOUS | Status: DC
  Administered 2021-01-16: 510 mg via INTRAVENOUS
  Filled 2021-01-16: qty 510

## 2021-01-23 ENCOUNTER — Other Ambulatory Visit: Payer: Self-pay

## 2021-01-23 ENCOUNTER — Encounter (HOSPITAL_COMMUNITY)
Admission: RE | Admit: 2021-01-23 | Discharge: 2021-01-23 | Disposition: A | Discharge status: Home / Self Care | Payer: Medicare Other | Source: Ambulatory Visit | Attending: Cardiology | Admitting: Cardiology

## 2021-01-23 DIAGNOSIS — D509 Iron deficiency anemia, unspecified: Secondary | ICD-10-CM

## 2021-01-23 MED ORDER — SODIUM CHLORIDE 0.9 % IV SOLN
510.0000 mg | INTRAVENOUS | Status: AC
  Administered 2021-01-23: 510 mg via INTRAVENOUS
  Filled 2021-01-23: qty 510

## 2021-01-26 ENCOUNTER — Other Ambulatory Visit (HOSPITAL_COMMUNITY): Payer: Self-pay | Admitting: Cardiology

## 2021-02-12 DIAGNOSIS — R0902 Hypoxemia: Secondary | ICD-10-CM | POA: Diagnosis not present

## 2021-02-21 ENCOUNTER — Other Ambulatory Visit (HOSPITAL_COMMUNITY): Payer: Self-pay

## 2021-02-21 MED ORDER — OPSUMIT 10 MG PO TABS: 10.0000 mg | ORAL_TABLET | Freq: Every day | ORAL | 5 refills | Status: DC

## 2021-03-01 DIAGNOSIS — M549 Dorsalgia, unspecified: Secondary | ICD-10-CM | POA: Diagnosis not present

## 2021-03-01 DIAGNOSIS — Z1389 Encounter for screening for other disorder: Secondary | ICD-10-CM | POA: Diagnosis not present

## 2021-03-01 DIAGNOSIS — M4800 Spinal stenosis, site unspecified: Secondary | ICD-10-CM | POA: Diagnosis not present

## 2021-03-01 DIAGNOSIS — Z79899 Other long term (current) drug therapy: Secondary | ICD-10-CM | POA: Diagnosis not present

## 2021-03-01 DIAGNOSIS — G894 Chronic pain syndrome: Secondary | ICD-10-CM | POA: Diagnosis not present

## 2021-03-15 DIAGNOSIS — R0902 Hypoxemia: Secondary | ICD-10-CM | POA: Diagnosis not present

## 2021-03-21 DIAGNOSIS — M549 Dorsalgia, unspecified: Secondary | ICD-10-CM | POA: Diagnosis not present

## 2021-03-21 DIAGNOSIS — M4800 Spinal stenosis, site unspecified: Secondary | ICD-10-CM | POA: Diagnosis not present

## 2021-03-27 ENCOUNTER — Other Ambulatory Visit (HOSPITAL_COMMUNITY): Payer: Self-pay | Admitting: *Deleted

## 2021-03-27 DIAGNOSIS — I2510 Atherosclerotic heart disease of native coronary artery without angina pectoris: Secondary | ICD-10-CM | POA: Diagnosis not present

## 2021-03-27 DIAGNOSIS — K219 Gastro-esophageal reflux disease without esophagitis: Secondary | ICD-10-CM | POA: Diagnosis not present

## 2021-03-27 DIAGNOSIS — I272 Pulmonary hypertension, unspecified: Secondary | ICD-10-CM | POA: Diagnosis not present

## 2021-03-27 DIAGNOSIS — J9611 Chronic respiratory failure with hypoxia: Secondary | ICD-10-CM | POA: Diagnosis not present

## 2021-03-27 DIAGNOSIS — M545 Low back pain, unspecified: Secondary | ICD-10-CM | POA: Diagnosis not present

## 2021-03-27 DIAGNOSIS — E119 Type 2 diabetes mellitus without complications: Secondary | ICD-10-CM | POA: Diagnosis not present

## 2021-03-27 DIAGNOSIS — N183 Chronic kidney disease, stage 3 unspecified: Secondary | ICD-10-CM | POA: Diagnosis not present

## 2021-03-27 DIAGNOSIS — I50810 Right heart failure, unspecified: Secondary | ICD-10-CM | POA: Diagnosis not present

## 2021-03-27 DIAGNOSIS — R634 Abnormal weight loss: Secondary | ICD-10-CM | POA: Diagnosis not present

## 2021-03-27 DIAGNOSIS — D649 Anemia, unspecified: Secondary | ICD-10-CM | POA: Diagnosis not present

## 2021-03-27 DIAGNOSIS — I10 Essential (primary) hypertension: Secondary | ICD-10-CM | POA: Diagnosis not present

## 2021-03-27 MED ORDER — ADEMPAS 2.5 MG PO TABS: 2.5000 mg | ORAL_TABLET | Freq: Three times a day (TID) | ORAL | 3 refills | Status: DC

## 2021-04-11 DIAGNOSIS — D638 Anemia in other chronic diseases classified elsewhere: Secondary | ICD-10-CM | POA: Diagnosis not present

## 2021-04-11 DIAGNOSIS — Z23 Encounter for immunization: Secondary | ICD-10-CM | POA: Diagnosis not present

## 2021-04-15 DIAGNOSIS — R0902 Hypoxemia: Secondary | ICD-10-CM | POA: Diagnosis not present

## 2021-04-16 DIAGNOSIS — D649 Anemia, unspecified: Secondary | ICD-10-CM | POA: Diagnosis not present

## 2021-04-18 DIAGNOSIS — Z79899 Other long term (current) drug therapy: Secondary | ICD-10-CM | POA: Diagnosis not present

## 2021-04-18 DIAGNOSIS — M4800 Spinal stenosis, site unspecified: Secondary | ICD-10-CM | POA: Diagnosis not present

## 2021-04-18 DIAGNOSIS — G894 Chronic pain syndrome: Secondary | ICD-10-CM | POA: Diagnosis not present

## 2021-04-18 DIAGNOSIS — M549 Dorsalgia, unspecified: Secondary | ICD-10-CM | POA: Diagnosis not present

## 2021-04-19 ENCOUNTER — Ambulatory Visit (HOSPITAL_COMMUNITY)
Admission: RE | Admit: 2021-04-19 | Discharge: 2021-04-19 | Disposition: A | Discharge status: Home / Self Care | Payer: Medicare Other | Source: Ambulatory Visit | Attending: Cardiology | Admitting: Cardiology

## 2021-04-19 ENCOUNTER — Other Ambulatory Visit: Payer: Self-pay

## 2021-04-19 ENCOUNTER — Encounter (HOSPITAL_COMMUNITY): Payer: Self-pay | Admitting: Cardiology

## 2021-04-19 VITALS — BP 118/70 | HR 79 | Wt 120.4 lb

## 2021-04-19 DIAGNOSIS — I110 Hypertensive heart disease with heart failure: Secondary | ICD-10-CM | POA: Insufficient documentation

## 2021-04-19 DIAGNOSIS — I2510 Atherosclerotic heart disease of native coronary artery without angina pectoris: Secondary | ICD-10-CM | POA: Insufficient documentation

## 2021-04-19 DIAGNOSIS — I5032 Chronic diastolic (congestive) heart failure: Secondary | ICD-10-CM | POA: Insufficient documentation

## 2021-04-19 DIAGNOSIS — D509 Iron deficiency anemia, unspecified: Secondary | ICD-10-CM | POA: Diagnosis not present

## 2021-04-19 DIAGNOSIS — Z7982 Long term (current) use of aspirin: Secondary | ICD-10-CM | POA: Insufficient documentation

## 2021-04-19 DIAGNOSIS — I272 Pulmonary hypertension, unspecified: Secondary | ICD-10-CM | POA: Diagnosis not present

## 2021-04-19 DIAGNOSIS — E1169 Type 2 diabetes mellitus with other specified complication: Secondary | ICD-10-CM | POA: Insufficient documentation

## 2021-04-19 DIAGNOSIS — Z79899 Other long term (current) drug therapy: Secondary | ICD-10-CM | POA: Diagnosis not present

## 2021-04-19 LAB — BASIC METABOLIC PANEL
Anion gap: 6 (ref 5–15)
BUN: 19 mg/dL (ref 8–23)
CO2: 31 mmol/L (ref 22–32)
Calcium: 9.5 mg/dL (ref 8.9–10.3)
Chloride: 100 mmol/L (ref 98–111)
Creatinine, Ser: 1.23 mg/dL — ABNORMAL HIGH (ref 0.44–1.00)
GFR, Estimated: 43 mL/min — ABNORMAL LOW (ref >60)
Glucose, Bld: 114 mg/dL — ABNORMAL HIGH (ref 70–99)
Potassium: 4.3 mmol/L (ref 3.5–5.1)
Sodium: 137 mmol/L (ref 135–145)

## 2021-04-19 NOTE — Progress Notes (Signed)
ID:  [name], DOB May 23, 1932, MRN [id number]   Provider location: [facility] Type of Visit: Established patient   PCP:  [name], PA-C  Cardiologist:  Jenkins Rouge, MD HF Cardiology: Dr. Aundra Dubin   History of Present Illness: [name] is an 87 y.o. with history of DM, HTN, and prior osteomyelitis of jaw referred by Dr. Johnsie Cancel for evaluation of pulmonary hypertension/RV failure.  She reports an episode of PNA in 3/19 and says that she never recovered from this.  She had gradually progressive exertional dyspnea to the point where she was short of breath walking around the house.  She had an echo in 7/19 that showed mildly decreased LV systolic function but severely decreased RV systolic function with dilated RV and at least moderate pulmonary hypertension by echo estimation.  She saw her PCP in 8/19 and was noted to be hypoxemic at rest.  He started her on home oxygen 2 L/[place] which she continues.    She was referred to cardiology, saw Dr. Johnsie Cancel. Amlodipine was stopped and she was started empirically on sildenafil 20 mg tid for pulmonary hypertension.   She was referred to CHF clinic.    RHC/LHC was done in 10/19, showing complex severe bifurcation lesion involving the proximal LAD and large D2 ostium. She had complex PCI requiring atherectomy and DES to pLAD and ostial D2.  RHC showed PAH with PVR 11.6 WU and CI 1.7.  She subsequently was found to have a right groin pseudoaneurysm from cath.   She was started on Opsumit.  Serologies have suggested CREST syndrome.  However, she saw Dr. Amil Amen for rheumatology who told her that he did not think that she had a rheumatologic diagnosis (per her report).   Cardiolite was abnormal in 3/20, this was followed by [facility] in 3/20 that showed nonobstructive CAD.  Echo in 3/20 showed EF 55-60%, with moderately decreased RV systolic function, PASP 64 mmHg.   Echo (3/21) showed EF 55-60%, moderately dilated RV with  moderately decreased systolic function, PASP 38 mmHg. Echo in 5/22 showed EF 55-60%, basal inferior akinesis, mildly dilated RV with moderately decreased systolic function, D-shaped septum.   She returns today for followup of pulmonary hypertension and CAD.  She continues to wear home oxygen 4L .  Main issue recently has been anemia.  She has had feraheme.  Per her report, stool was checked for blood and was negative.  Weight is down.  She was unable to increase orenitram due to nausea.  She has not been feeling great recently, she attributes this to anemia.  She has an appt with hematology.  She does not get short of breath walking on flat ground as long as she is using her oxygen.  No chest pain.  No lightheadedness but she does get periodic episodes that seem like vertigo.   6 minute walk (12/19): 79 m 6 minute walk (1/20): 121 m 6 minute walk (2/21): 91 m 6 minute walk (9/21): 152 m 6 minute walk (2/22): 152 m 6 minute walk (5/22): 259 m  Labs (12/19): K 3.9, creatinine 0.78 Labs (3/20): K 4, creatinine 1.1, hgb 10.2 Labs (12/20): LDL 67, TGs 28, K 4.4, creatinine 1.04 Labs (2/21): BNP 181, K 4, creatinine 0.94 Labs (5/21): K 4.2, creatinine 0.94, BNP 202 Labs (8/21): hgb 10.4 Labs (9/21): LDL 38, TGs 289 Labs (11/21): K 3.9, creatinine 0.84 Labs (12/21): K 4.9, creatinine 0.98, LDL 48, TGs 209, BNP 197 Labs (4/22): BNP  218, K 3.2, creatinine 0.98, hgb 8.3 Labs (6/22): K 4.8, creatinine 1.13 Labs (9/22): hgb 8.5  PMH: 1. H/o osteomyelitis of jaw.  2. Duodenal ulcers with upper GI bleeding. 3. Type 2 diabetes 4. Hyperlipidemia 5. Squamous cell carcinoma of scalp with radiation and surgery.  6. GERD 7. H/o back surgery  8. HTN 9. Pulmonary hypertension/RV failure: Echo (7/19) with EF 45-50%, moderate focal basal septal hypertrophy, mild diffuse hypokinesis, septal flattening, severe RV dilation with severely decreased systolic function, moderate TR, PASP 61 mmHg.   - CT chest  (7/19): Mild emphysema, enlarged PA.  - V/Q scan (8/19): No evidence for chronic PE.  - anti-centromere Ab positive, ANA positive, RF 73 => suspect CREST syndrome.  - RHC (10/19): mean RA 7, PA 64/21 mean 38, mean PCWP 8, CI 1.7, PVR 11.6 WU.  - Echo (3/20): EF 55-60%, moderately decreased RV systolic function, PASP 64 mmHg - Echo (3/21): EF 55-60%, moderately dilated RV with moderately decreased systolic function, PASP 38 mmHg. - Echo (5/22): EF 55-60%, basal inferior akinesis, mildly dilated RV with moderately decreased systolic function, D-shaped septum. 10. CAD: LHC (10/19) with 95% proximal LAD and 95% ostial D2.  Complicated bifurcation intervention with atherectomy and DES to proximal LAD and ostial D2.   - LHC (3/20): 40% mLAD, 30% mRCA.  11. Post-cath right groin pseudoaneurysm in 10/19.  12. Anemia: Fe deficiency.  FOBT negative.   SH: Lives in [place] alone, daughter lives very close.  Quit smoking > 20 years ago. No ETOH.   FH: No history of pulmonary hypertension.  Mother with "heart problems."  Sister with PPM.    Review of systems complete and found to be negative unless listed in HPI.   Current Outpatient Medications  Medication Sig Dispense Refill   ALPRAZolam (XANAX) 0.5 MG tablet Take 0.5 mg by mouth daily as needed for anxiety.      aspirin EC 81 MG tablet Take 1 tablet (81 mg total) by mouth daily. 30 tablet 11   atorvastatin (LIPITOR) 80 MG tablet Take 1 tablet (80 mg total) by mouth daily. 90 tablet 3   carvedilol (COREG) 3.125 MG tablet TAKE 1 TABLET BY MOUTH TWICE DAILY WITH A MEAL 60 tablet 11   cyclobenzaprine (FLEXERIL) 10 MG tablet Take 10 mg by mouth in the morning.     FERROUS SULFATE PO Take 40 mg by mouth daily.     furosemide (LASIX) 40 MG tablet TAKE 1 TABLET BY MOUTH EVERY MORNING, AND 1/2 TABLET IN THE EVENING 45 tablet 11   gabapentin (NEURONTIN) 300 MG capsule Take 300 mg by mouth as needed.     lactose free nutrition (BOOST) LIQD Take 237 mLs by  mouth 2 (two) times daily between meals.      mirtazapine (REMERON) 30 MG tablet Take 30 mg by mouth at bedtime.     OPSUMIT 10 MG tablet Take 1 tablet (10 mg total) by mouth daily. 90 tablet 5   potassium chloride SA (KLOR-CON) 20 MEQ tablet TAKE 1 TABLET(20 MEQ) BY MOUTH DAILY 30 tablet 6   Riociguat (ADEMPAS) 2.5 MG TABS Take 2.5 mg by mouth in the morning, at noon, and at bedtime. 90 tablet 3   traMADol-acetaminophen (ULTRACET) 37.5-325 MG tablet Take 2 tablets by mouth 4 (four) times daily as needed for pain.  2   Treprostinil Diolamine ER 0.125 MG TBCR Take 0.125 mg by mouth in the morning, at noon, and at bedtime. 0.25 mg 3x a day  No current facility-administered medications for this encounter.   BP 118/70   Pulse 79   Wt 54.6 kg (120 lb 6.4 oz)   SpO2 99% Comment: 3 l n/c  BMI 21.67 kg/m   Wt Readings from Last 3 Encounters:  04/19/21 54.6 kg (120 lb 6.4 oz)  12/29/20 59.1 kg (130 lb 6.4 oz)  09/27/20 60.4 kg (133 lb 3.2 oz)   BP 118/70   Pulse 79   Wt 54.6 kg (120 lb 6.4 oz)   SpO2 99% Comment: 3 l n/c  BMI 21.67 kg/m  General: NAD Neck: No JVD, no thyromegaly or thyroid nodule.  Lungs: Slight dry crackles at bases.  CV: Nondisplaced PMI.  Heart regular S1/S2, no S3/S4, no murmur.  No peripheral edema.  No carotid bruit.  Normal pedal pulses.  Abdomen: Soft, nontender, no hepatosplenomegaly, no distention.  Skin: Intact without lesions or rashes.  Neurologic: Alert and oriented x 3.  Psych: Normal affect. Extremities: No clubbing or cyanosis.  HEENT: Normal.   Assessment/Plan: 1. CAD: Coronary angiography showed complex bifurcation lesion with 95% stenosis proximal LAD with calcification at take-off of large D2. The ostial/proximal D2 also had 95% calcified stenosis. Suspect this lesion played a role in her exertional dyspnea and also in her mildly decreased LV systolic function (EF 59-45% on echo in 7/19).  S/p successful atherectomy with bifurcation stenting of  proximal LAD and D2 05/15/18.  Repeat LHC in 3/20 showed nonobstructive disease.  No chest pain.  - Continue ASA 81.  - Plavix stopped with anemia.   - Continue atorvastatin, good LDL in 12/21.  - Continue Vascepa.   2. Pulmonary hypertension: She appeared to have at least moderate pulmonary hypertension by echo in 7/19 with RV failure.  CT chest showed mild emphysema, which should not explain her degree of RV failure.  V/Q scan did not show evidence for chronic PEs. Anti-centromere antibody, ANA, and RF were all positive.  ?CREST variant of scleroderma.  She is on home oxygen.  [place] 05/15/18 showed moderate PAH but very high PVR and low cardiac output.  This was concerning for advanced pulmonary hypertension.  Repeat echo in 3/21 showed moderately dilated/dysfunctional RV but PASP estimate was lower.  Echo in 5/22 showed mildly dilated/moderately dysfunctional RV, TR doppler jet not complete so probably not accurate PA pressure estimate.  She did not tolerate Uptravi.  Symptomatically, NYHA class II-III, stable. - Continue riociguat, now on goal dose.  - Continue Opsumit 10 mg daily.  - Continue orenitram, she is unable to titrate any higher due to GI side effects.   - Refused sleep study.  - Continue home oxygen.  - BMET/BNP today.   3. Chronic diastolic CHF/RV failure:  NYHA II-III.  She does not appear volume overloaded.  - Continue Lasix 40 qam/20 qpm.     4. ?CREST syndrome: Patient saw [facility]. Per her report, they did not think that she had CREST syndrome.    5. Anemia: Fe deficiency.  No overt GI bleeding, per her report FOBT was negative.  - Has appt with hematology  Followup in 3 months   Loralie Champagne 04/19/2021

## 2021-04-19 NOTE — Patient Instructions (Signed)
No medication changes  Labs today We will only contact you if something comes back abnormal or we need to make some changes. Otherwise no news is good news!  Your physician recommends that you schedule a follow-up appointment in: 3 months with Dr Aundra Dubin  Please call office at [phone] option [phone] if you have any questions or concerns.    At the [facility], you and your health needs are our priority. As part of our continuing mission to provide you with exceptional heart care, we have created designated Provider Care Teams. These Care Teams include your primary Cardiologist (physician) and Advanced Practice Providers (APPs- Physician Assistants and Nurse Practitioners) who all work together to provide you with the care you need, when you need it.   You may see any of the following providers on your designated Care Team at your next follow up: Dr Glori Bickers Dr Loralie Champagne Dr Patrice Paradise, NP Lyda Jester, [place] Ginnie Smart Audry Riles, PharmD   Please be sure to bring in all your medications bottles to every appointment.

## 2021-04-27 ENCOUNTER — Telehealth: Payer: Self-pay | Admitting: Oncology

## 2021-04-27 NOTE — Telephone Encounter (Signed)
Scheduled appt per 9/21 referral. PT is aware of appt date and time.

## 2021-05-13 ENCOUNTER — Other Ambulatory Visit: Payer: Self-pay | Admitting: Oncology

## 2021-05-13 DIAGNOSIS — D649 Anemia, unspecified: Secondary | ICD-10-CM

## 2021-05-14 ENCOUNTER — Telehealth: Payer: Self-pay | Admitting: Oncology

## 2021-05-14 ENCOUNTER — Encounter: Payer: Medicare Other | Admitting: Oncology

## 2021-05-14 ENCOUNTER — Other Ambulatory Visit: Payer: Medicare Other

## 2021-05-14 NOTE — Telephone Encounter (Signed)
Patient's daughter rescheduled today's Appt to 10/18 Labs 3:30 pm - Consult 4:00 pm

## 2021-05-15 DIAGNOSIS — R0902 Hypoxemia: Secondary | ICD-10-CM | POA: Diagnosis not present

## 2021-05-17 ENCOUNTER — Other Ambulatory Visit (HOSPITAL_COMMUNITY): Payer: Self-pay

## 2021-05-17 MED ORDER — POTASSIUM CHLORIDE CRYS ER 20 MEQ PO TBCR: EXTENDED_RELEASE_TABLET | ORAL | 4 refills | Status: DC

## 2021-05-21 ENCOUNTER — Other Ambulatory Visit: Payer: Self-pay | Admitting: Oncology

## 2021-05-21 DIAGNOSIS — D539 Nutritional anemia, unspecified: Secondary | ICD-10-CM

## 2021-05-21 NOTE — Progress Notes (Signed)
[facility]  [address] [place],  [place]  [address] [phone]  Clinic Day:  05/22/2021  Referring physician: [name], PA-C   HISTORY OF PRESENT ILLNESS:  The patient is a 87 y.o. female  who I was asked to consult upon for anemia.  A recent CBC showed a low hemoglobin of 9, with a normal MCV of 90.  With respect to her anemia, she has been on iron pills for multiple months.  She was also given IV iron 2 months ago.  She recalls having an episode of hematemesis 1 month ago.  She had an EGD 1 year ago, which was apparently normal.  She has not had a colonoscopy in over 5 years.  To her knowledge, there is no family history of anemia or other hematologic disorders.  PAST MEDICAL HISTORY:   Past Medical History:  Diagnosis Date   Anemia    Anxiety    Arthritis    osteo; "mostly hands, knees, probably back too" (12/09/2016)   Chronic lower back pain    Complication of anesthesia 2011   resp distress -on vent after surgery   Coronary artery disease    Depression    Diabetes ([place])    Esophageal stricture    GERD (gastroesophageal reflux disease)    Headache    out grew them   High cholesterol    History of blood transfusion 2011; ?03/2012   "related to ORs" (12/09/2016)   Hypertension    Intestinal obstruction (HCC)    Macular degeneration of left eye    dx over 55 yrs ago.....hasn't changed much   Shingles    Squamous carcinoma 2013   squamas cell on scalp--took 14 radiation tx--2013   Stroke [facility]) 1967   Mini stroke;  No lasting deficits   Type II diabetes mellitus ([place]) dx'd 11/2016  Pulmonary hypertension  PAST SURGICAL HISTORY:   Past Surgical History:  Procedure Laterality Date   ABDOMINAL HYSTERECTOMY     APPENDECTOMY     BACK SURGERY     BALLOON DILATION N/A 06/24/2020   Procedure: BALLOON DILATION;  Surgeon: Clarene Essex, MD;  Location: [facility];  Service: Endoscopy;  Laterality: N/A;   CATARACT EXTRACTION,  BILATERAL Bilateral    CHOLECYSTECTOMY     COLON SURGERY     COLONOSCOPY     CORONARY ATHERECTOMY N/A 05/15/2018   Procedure: CORONARY ATHERECTOMY;  Surgeon: Leonie Man, MD;  Location: [facility];  Service: Cardiovascular;  Laterality: N/A;   CORONARY STENT INTERVENTION N/A 05/15/2018   Procedure: CORONARY STENT INTERVENTION;  Surgeon: Leonie Man, MD;  Location: [facility];  Service: Cardiovascular;  Laterality: N/A;   ESOPHAGOGASTRODUODENOSCOPY N/A 06/24/2020   Procedure: ESOPHAGOGASTRODUODENOSCOPY (EGD);  Surgeon: Clarene Essex, MD;  Location: [facility];  Service: Endoscopy;  Laterality: N/A;   ESOPHAGOGASTRODUODENOSCOPY (EGD) WITH ESOPHAGEAL DILATION  "several times"   ESOPHAGOGASTRODUODENOSCOPY (EGD) WITH PROPOFOL N/A 12/31/2016   Procedure: ESOPHAGOGASTRODUODENOSCOPY (EGD) WITH PROPOFOL;  Surgeon: Wilford Corner, MD;  Location: [facility];  Service: Endoscopy;  Laterality: N/A;   EYE SURGERY     FRACTURE SURGERY     IR FLUORO GUIDE CV LINE RIGHT  01/02/2017   IR US GUIDE VASC ACCESS RIGHT  01/02/2017   JOINT REPLACEMENT     LEFT HEART CATH AND CORONARY ANGIOGRAPHY N/A 10/09/2018   Procedure: LEFT HEART CATH AND CORONARY ANGIOGRAPHY;  Surgeon: Larey Dresser, MD;  Location: [facility];  Service: Cardiovascular;  Laterality: N/A;   LUMBAR DISC SURGERY  10/25/2014   Right L4-L5 removal of seven free fragments of disk mostly posterolaterally.  Lysis of adhesion.  Microscope/notes 10/26/2014   LUMBAR LAMINECTOMY/DECOMPRESSION MICRODISCECTOMY Right 08/09/2014   Procedure: Right Lumbar Four to Five Microdiskectomy;  Surgeon: Floyce Stakes, MD;  [facility];  Service: Neurosurgery;  Laterality: Right;  Right L4-5 Microdiskectomy   ORIF PERIPROSTHETIC FRACTURE  03/31/2012   Procedure: OPEN REDUCTION INTERNAL FIXATION (ORIF) PERIPROSTHETIC FRACTURE;  Surgeon: Mauri Pole, MD;  Location: [facility];  Service: Orthopedics;  Laterality: Left;  Open  reduction internal fixation Left distal femur periprosthetic fracture   RIGHT/LEFT HEART CATH AND CORONARY ANGIOGRAPHY N/A 05/14/2018   Procedure: RIGHT/LEFT HEART CATH AND CORONARY ANGIOGRAPHY;  Surgeon: Larey Dresser, MD;  Location: [facility];  Service: Cardiovascular;  Laterality: N/A;   SMALL INTESTINE SURGERY  2011   "really bad blockage; went into respiratory distress and was on ventilator after surgery; [facility]"   SQUAMOUS CELL CARCINOMA EXCISION  ~ 2012   S/P "cut off her head then 15 radiation txs"    TOTAL KNEE ARTHROPLASTY Bilateral    bilateral    CURRENT MEDICATIONS:   Current Outpatient Medications  Medication Sig Dispense Refill   ALPRAZolam (XANAX) 0.5 MG tablet Take 0.5 mg by mouth daily as needed for anxiety.      aspirin EC 81 MG tablet Take 1 tablet (81 mg total) by mouth daily. 30 tablet 11   atorvastatin (LIPITOR) 80 MG tablet Take 1 tablet (80 mg total) by mouth daily. 90 tablet 3   carvedilol (COREG) 3.125 MG tablet TAKE 1 TABLET BY MOUTH TWICE DAILY WITH A MEAL 60 tablet 11   cyclobenzaprine (FLEXERIL) 10 MG tablet Take 10 mg by mouth in the morning.     FERROUS SULFATE PO Take 40 mg by mouth daily.     furosemide (LASIX) 40 MG tablet TAKE 1 TABLET BY MOUTH EVERY MORNING, AND 1/2 TABLET IN THE EVENING 45 tablet 11   gabapentin (NEURONTIN) 300 MG capsule Take 300 mg by mouth as needed.     lactose free nutrition (BOOST) LIQD Take 237 mLs by mouth 2 (two) times daily between meals.      mirtazapine (REMERON) 30 MG tablet Take 30 mg by mouth at bedtime.     OPSUMIT 10 MG tablet Take 1 tablet (10 mg total) by mouth daily. 90 tablet 5   potassium chloride SA (KLOR-CON) 20 MEQ tablet Take 1 tablet daily 30 tablet 4   Riociguat (ADEMPAS) 2.5 MG TABS Take 2.5 mg by mouth in the morning, at noon, and at bedtime. 90 tablet 3   traMADol-acetaminophen (ULTRACET) 37.5-325 MG tablet Take 2 tablets by mouth 4 (four) times daily as needed for pain.  2    Treprostinil Diolamine ER 0.125 MG TBCR Take 0.125 mg by mouth in the morning, at noon, and at bedtime. 0.25 mg 3x a day     No current facility-administered medications for this visit.    ALLERGIES:   Allergies  Allergen Reactions   Sulfa Antibiotics Swelling    Mouth and tongue swelling    FAMILY HISTORY:  Her father died from a stroke. Her mother died from heart disease.  Her 2 brothers died from heart attacks.  She has 4 sisters, 1 who is deceased.  SOCIAL HISTORY:  The patient was born and raised in [place].  She lives in [place].  She is widowed; she was married  for 58 years.  She has 3 children, 5 grandchildren and multiple great grandchildren.  She was a housewife.  She smoked a pack of cigarettes weekly for 7-8 weeks, but has not smoked in numerous years.  REVIEW OF SYSTEMS:  Review of Systems  Constitutional:  Positive for unexpected weight change. Negative for fatigue and fever.  HENT:   Negative for hearing loss and sore throat.   Eyes:  Negative for eye problems.  Respiratory:  Positive for shortness of breath. Negative for chest tightness, cough and hemoptysis.   Cardiovascular:  Negative for chest pain and palpitations.  Gastrointestinal:  Negative for abdominal distention, abdominal pain, blood in stool, constipation, diarrhea, nausea and vomiting.  Endocrine: Negative for hot flashes.  Genitourinary:  Negative for difficulty urinating, dysuria, frequency, hematuria and nocturia.   Musculoskeletal:  Positive for flank pain. Negative for arthralgias, back pain, gait problem and myalgias.  Skin: Negative.  Negative for itching and rash.  Neurological:  Positive for dizziness. Negative for extremity weakness, gait problem, headaches, light-headedness and numbness.  Hematological: Negative.   Psychiatric/Behavioral: Negative.  Negative for depression and suicidal ideas. The patient is not nervous/anxious.     PHYSICAL EXAM:  Blood pressure 139/61, pulse (!) 102,  temperature 98.4 F (36.9 C), resp. rate 14, height _0  (1.575 m), weight 122 lb 6.4 oz (55.5 kg), SpO2 98 %. Wt Readings from Last 3 Encounters:  05/22/21 122 lb 6.4 oz (55.5 kg)  04/19/21 120 lb 6.4 oz (54.6 kg)  12/29/20 130 lb 6.4 oz (59.1 kg)   Body mass index is 22.39 kg/m. Performance status (ECOG): 1 - Symptomatic but completely ambulatory Physical Exam Constitutional:      Appearance: Normal appearance. She is not ill-appearing.  HENT:     Mouth/Throat:     Mouth: Mucous membranes are moist.     Pharynx: Oropharynx is clear. No oropharyngeal exudate or posterior oropharyngeal erythema.  Cardiovascular:     Rate and Rhythm: Normal rate and regular rhythm.     Heart sounds: No murmur heard.   No friction rub. No gallop.  Pulmonary:     Effort: Pulmonary effort is normal. No respiratory distress.     Breath sounds: Normal breath sounds. No wheezing, rhonchi or rales.  Abdominal:     General: Bowel sounds are normal. There is no distension.     Palpations: Abdomen is soft. There is no mass.     Tenderness: There is no abdominal tenderness.  Musculoskeletal:        General: No swelling.     Right lower leg: No edema.     Left lower leg: No edema.  Lymphadenopathy:     Cervical: No cervical adenopathy.     Upper Body:     Right upper body: No supraclavicular or axillary adenopathy.     Left upper body: No supraclavicular or axillary adenopathy.     Lower Body: No right inguinal adenopathy. No left inguinal adenopathy.  Skin:    General: Skin is warm.     Coloration: Skin is not jaundiced.     Findings: No lesion or rash.  Neurological:     General: No focal deficit present.     Mental Status: She is alert and oriented to person, place, and time. Mental status is at baseline.  Psychiatric:        Mood and Affect: Mood normal.        Behavior: Behavior normal.        Thought Content: Thought  content normal.   LABS:   CBC Latest Ref Rng & Units 05/22/2021  11/18/2020 06/24/2020  WBC - 6.2 6.7 6.5  Hemoglobin 12.0 - 16.0 7.8(A) 8.3(L) 9.8(L)  Hematocrit 36 - 46 22(A) 28.0(L) 33.2(L)  Platelets 150 - 399 286 286 244   CMP Latest Ref Rng & Units 05/22/2021 04/19/2021 12/29/2020  Glucose 70 - 99 mg/dL - 114(H) 101(H)  BUN 4 - 21 34(A) 19 21  Creatinine 0.5 - 1.1 1.4(A) 1.23(H) 0.96  Sodium 137 - 147 139 137 138  Potassium 3.4 - 5.3 4.6 4.3 3.9  Chloride 99 - 108 98(A) 100 98  CO2 13 - 22 35(A) 31 32  Calcium 8.7 - 10.7 9.1 9.5 9.2  Total Protein 6.5 - 8.1 g/dL - - -  Total Bilirubin 0.3 - 1.2 mg/dL - - -  Alkaline Phos 25 - 125 61 - -  AST 13 - 35 26 - -  ALT 7 - 35 12 - -   ASSESSMENT & PLAN:  An 87 y.o. female who I was asked to consult upon for anemia.  In the past, it was related to iron deficiency.  Her labs today suggest a component of her anemia may be due to renal insufficiency.  Her iron, B12 and folate levels will be checked to ensure there are no nutritional deficiencies factoring into her anemia.  A serum protein electrophoresis will also be checked to ensure a plasma cell dyscrasia, such as multiple myeloma, is not factoring into her anemia.  She is on 2 medications for her pulmonary hypertension (Adempas; Opsumit) that can also potentially cause anemia.  I will see her back next week to go over all of labs, which will be used to formulate her next course of action as it pertains to her anemia.  The patient understands all the plans discussed today and is in agreement with them.  I do appreciate [name], PA-C for his new consult.   Jennylee Uehara Macarthur Critchley, MD

## 2021-05-22 ENCOUNTER — Other Ambulatory Visit: Payer: Self-pay | Admitting: Oncology

## 2021-05-22 ENCOUNTER — Inpatient Hospital Stay: Payer: Medicare Other | Attending: Oncology

## 2021-05-22 ENCOUNTER — Other Ambulatory Visit: Payer: Self-pay | Admitting: Hematology and Oncology

## 2021-05-22 ENCOUNTER — Inpatient Hospital Stay: Payer: Medicare Other | Admitting: Oncology

## 2021-05-22 ENCOUNTER — Telehealth: Payer: Self-pay | Admitting: Oncology

## 2021-05-22 VITALS — BP 139/61 | HR 102 | Temp 98.4°F | Resp 14 | Ht 62.0 in | Wt 122.4 lb

## 2021-05-22 DIAGNOSIS — D539 Nutritional anemia, unspecified: Secondary | ICD-10-CM

## 2021-05-22 DIAGNOSIS — R5383 Other fatigue: Secondary | ICD-10-CM | POA: Diagnosis not present

## 2021-05-22 DIAGNOSIS — I272 Pulmonary hypertension, unspecified: Secondary | ICD-10-CM | POA: Diagnosis not present

## 2021-05-22 DIAGNOSIS — D631 Anemia in chronic kidney disease: Secondary | ICD-10-CM

## 2021-05-22 DIAGNOSIS — D649 Anemia, unspecified: Secondary | ICD-10-CM | POA: Diagnosis not present

## 2021-05-22 DIAGNOSIS — D638 Anemia in other chronic diseases classified elsewhere: Secondary | ICD-10-CM | POA: Diagnosis not present

## 2021-05-22 LAB — HEPATIC FUNCTION PANEL
ALT: 12 (ref 7–35)
AST: 26 (ref 13–35)
Alkaline Phosphatase: 61 (ref 25–125)
Bilirubin, Total: 0.1

## 2021-05-22 LAB — FOLATE: Folate: 64.6 ng/mL (ref >5.9)

## 2021-05-22 LAB — BASIC METABOLIC PANEL
BUN: 34 — AB (ref 4–21)
CO2: 35 — AB (ref 13–22)
Chloride: 98 — AB (ref 99–108)
Creatinine: 1.4 — AB (ref 0.5–1.1)
Glucose: 138
Potassium: 4.6 (ref 3.4–5.3)
Sodium: 139 (ref 137–147)

## 2021-05-22 LAB — IRON AND TIBC
Iron: 38 ug/dL (ref 28–170)
Saturation Ratios: 12 % (ref 10.4–31.8)
TIBC: 319 ug/dL (ref 250–450)
UIBC: 281 ug/dL

## 2021-05-22 LAB — VITAMIN B12: Vitamin B-12: 827 pg/mL (ref 180–914)

## 2021-05-22 LAB — FERRITIN: Ferritin: 31 ng/mL (ref 11–307)

## 2021-05-22 LAB — CBC AND DIFFERENTIAL
HCT: 22 — AB (ref 36–46)
Hemoglobin: 7.8 — AB (ref 12.0–16.0)
Neutrophils Absolute: 3.35
Platelets: 286 (ref 150–399)
WBC: 6.2

## 2021-05-22 LAB — COMPREHENSIVE METABOLIC PANEL
Albumin: 3.6 (ref 3.5–5.0)
Calcium: 9.1 (ref 8.7–10.7)

## 2021-05-22 LAB — CBC
MCV: 88 (ref 81–99)
RBC: 2.54 — AB (ref 3.87–5.11)

## 2021-05-22 LAB — TSH: TSH: 0.706 u[IU]/mL (ref 0.350–4.500)

## 2021-05-22 LAB — LACTATE DEHYDROGENASE: LDH: 121 U/L (ref 98–192)

## 2021-05-22 NOTE — Telephone Encounter (Signed)
Per 10/18 LOS, patient scheduled for 10/27 Follow Up

## 2021-05-23 DIAGNOSIS — M549 Dorsalgia, unspecified: Secondary | ICD-10-CM | POA: Diagnosis not present

## 2021-05-23 DIAGNOSIS — Z1389 Encounter for screening for other disorder: Secondary | ICD-10-CM | POA: Diagnosis not present

## 2021-05-23 DIAGNOSIS — M4800 Spinal stenosis, site unspecified: Secondary | ICD-10-CM | POA: Diagnosis not present

## 2021-05-25 LAB — PROTEIN ELECTROPHORESIS, SERUM
A/G Ratio: 0.9 (ref 0.7–1.7)
Albumin ELP: 2.9 g/dL (ref 2.9–4.4)
Alpha-1-Globulin: 0.3 g/dL (ref 0.0–0.4)
Alpha-2-Globulin: 0.9 g/dL (ref 0.4–1.0)
Beta Globulin: 1 g/dL (ref 0.7–1.3)
Gamma Globulin: 1 g/dL (ref 0.4–1.8)
Globulin, Total: 3.2 g/dL (ref 2.2–3.9)
Total Protein ELP: 6.1 g/dL (ref 6.0–8.5)

## 2021-05-30 NOTE — Progress Notes (Deleted)
[facility]  [address] [place],  [place]  [address] [phone]  Clinic Day:  05/21/2021  Referring physician: [name], PA-C   HISTORY OF PRESENT ILLNESS:  The patient is a 87 y.o. female  who I was asked to consult upon for anemia.  A recent CBC showed a low hemoglobin of 9, with a normal MCV of 90.    PAST MEDICAL HISTORY:   Past Medical History:  Diagnosis Date   Anemia    Anxiety    Arthritis    osteo; "mostly hands, knees, probably back too" (12/09/2016)   Chronic lower back pain    Complication of anesthesia 2011   resp distress -on vent after surgery   Coronary artery disease    Depression    Diabetes ([place])    Esophageal stricture    GERD (gastroesophageal reflux disease)    Headache    out grew them   High cholesterol    History of blood transfusion 2011; ?03/2012   "related to ORs" (12/09/2016)   Hypertension    Intestinal obstruction (HCC)    Macular degeneration of left eye    dx over 55 yrs ago.....hasn't changed much   Shingles    Squamous carcinoma 2013   squamas cell on scalp--took 14 radiation tx--2013   Stroke [facility]) 1967   Mini stroke;  No lasting deficits   Type II diabetes mellitus ([place]) dx'd 11/2016    PAST SURGICAL HISTORY:   Past Surgical History:  Procedure Laterality Date   ABDOMINAL HYSTERECTOMY     APPENDECTOMY     BACK SURGERY     BALLOON DILATION N/A 06/24/2020   Procedure: BALLOON DILATION;  Surgeon: Clarene Essex, MD;  Location: [facility];  Service: Endoscopy;  Laterality: N/A;   CATARACT EXTRACTION, BILATERAL Bilateral    CHOLECYSTECTOMY     COLON SURGERY     COLONOSCOPY     CORONARY ATHERECTOMY N/A 05/15/2018   Procedure: CORONARY ATHERECTOMY;  Surgeon: Leonie Man, MD;  Location: [facility];  Service: Cardiovascular;  Laterality: N/A;   CORONARY STENT INTERVENTION N/A 05/15/2018   Procedure: CORONARY STENT INTERVENTION;  Surgeon: Leonie Man, MD;  Location: [facility];  Service: Cardiovascular;  Laterality: N/A;   ESOPHAGOGASTRODUODENOSCOPY N/A 06/24/2020   Procedure: ESOPHAGOGASTRODUODENOSCOPY (EGD);  Surgeon: Clarene Essex, MD;  Location: [facility];  Service: Endoscopy;  Laterality: N/A;   ESOPHAGOGASTRODUODENOSCOPY (EGD) WITH ESOPHAGEAL DILATION  "several times"   ESOPHAGOGASTRODUODENOSCOPY (EGD) WITH PROPOFOL N/A 12/31/2016   Procedure: ESOPHAGOGASTRODUODENOSCOPY (EGD) WITH PROPOFOL;  Surgeon: Wilford Corner, MD;  Location: [facility];  Service: Endoscopy;  Laterality: N/A;   EYE SURGERY     FRACTURE SURGERY     IR FLUORO GUIDE CV LINE RIGHT  01/02/2017   IR US GUIDE VASC ACCESS RIGHT  01/02/2017   JOINT REPLACEMENT     LEFT HEART CATH AND CORONARY ANGIOGRAPHY N/A 10/09/2018   Procedure: LEFT HEART CATH AND CORONARY ANGIOGRAPHY;  Surgeon: Larey Dresser, MD;  Location: [facility];  Service: Cardiovascular;  Laterality: N/A;   LUMBAR DISC SURGERY  10/25/2014   Right L4-L5 removal of seven free fragments of disk mostly posterolaterally.  Lysis of adhesion.  Microscope/notes 10/26/2014   LUMBAR LAMINECTOMY/DECOMPRESSION MICRODISCECTOMY Right 08/09/2014   Procedure: Right Lumbar Four to Five Microdiskectomy;  Surgeon: Floyce Stakes, MD;  [facility];  Service: Neurosurgery;  Laterality: Right;  Right L4-5 Microdiskectomy   ORIF PERIPROSTHETIC FRACTURE  03/31/2012   Procedure: OPEN REDUCTION INTERNAL FIXATION (ORIF) PERIPROSTHETIC FRACTURE;  Surgeon: Mauri Pole, MD;  [facility];  Service: Orthopedics;  Laterality: Left;  Open reduction internal fixation Left distal femur periprosthetic fracture   RIGHT/LEFT HEART CATH AND CORONARY ANGIOGRAPHY N/A 05/14/2018   Procedure: RIGHT/LEFT HEART CATH AND CORONARY ANGIOGRAPHY;  Surgeon: Larey Dresser, MD;  Location: [facility];  Service: Cardiovascular;  Laterality: N/A;   SMALL INTESTINE SURGERY  2011   "really bad blockage; went into respiratory distress and  was on ventilator after surgery; [facility]"   SQUAMOUS CELL CARCINOMA EXCISION  ~ 2012   S/P "cut off her head then 15 radiation txs"    TOTAL KNEE ARTHROPLASTY Bilateral    bilateral    CURRENT MEDICATIONS:   Current Outpatient Medications  Medication Sig Dispense Refill   ALPRAZolam (XANAX) 0.5 MG tablet Take 0.5 mg by mouth daily as needed for anxiety.      aspirin EC 81 MG tablet Take 1 tablet (81 mg total) by mouth daily. 30 tablet 11   atorvastatin (LIPITOR) 80 MG tablet Take 1 tablet (80 mg total) by mouth daily. 90 tablet 3   carvedilol (COREG) 3.125 MG tablet TAKE 1 TABLET BY MOUTH TWICE DAILY WITH A MEAL 60 tablet 11   cyclobenzaprine (FLEXERIL) 10 MG tablet Take 10 mg by mouth in the morning.     FERROUS SULFATE PO Take 40 mg by mouth daily.     furosemide (LASIX) 40 MG tablet TAKE 1 TABLET BY MOUTH EVERY MORNING, AND 1/2 TABLET IN THE EVENING 45 tablet 11   gabapentin (NEURONTIN) 300 MG capsule Take 300 mg by mouth as needed.     lactose free nutrition (BOOST) LIQD Take 237 mLs by mouth 2 (two) times daily between meals.      mirtazapine (REMERON) 30 MG tablet Take 30 mg by mouth at bedtime.     OPSUMIT 10 MG tablet Take 1 tablet (10 mg total) by mouth daily. 90 tablet 5   potassium chloride SA (KLOR-CON) 20 MEQ tablet Take 1 tablet daily 30 tablet 4   Riociguat (ADEMPAS) 2.5 MG TABS Take 2.5 mg by mouth in the morning, at noon, and at bedtime. 90 tablet 3   traMADol-acetaminophen (ULTRACET) 37.5-325 MG tablet Take 2 tablets by mouth 4 (four) times daily as needed for pain.  2   Treprostinil Diolamine ER 0.125 MG TBCR Take 0.125 mg by mouth in the morning, at noon, and at bedtime. 0.25 mg 3x a day     No current facility-administered medications for this visit.    ALLERGIES:   Allergies  Allergen Reactions   Sulfa Antibiotics Swelling    Mouth and tongue swelling    FAMILY HISTORY:  No family history on file.  SOCIAL HISTORY:   reports that she quit  smoking about 30 years ago. Her smoking use included cigarettes. She has a 1.00 pack-year smoking history. She has never used smokeless tobacco. She reports that she does not drink alcohol and does not use drugs.  REVIEW OF SYSTEMS:  Review of Systems - Oncology   PHYSICAL EXAM:  There were no vitals taken for this visit. Wt Readings from Last 3 Encounters:  04/19/21 120 lb 6.4 oz (54.6 kg)  12/29/20 130 lb 6.4 oz (59.1 kg)  09/27/20 133 lb 3.2 oz (60.4 kg)   There is no height or weight on file to calculate BMI. Performance status (ECOG): {CHL ONC [id number] Physical Exam .phy  LABS:   CBC Latest Ref Rng & Units 11/18/2020 06/24/2020 06/23/2020  WBC 4.0 - 10.5 K/uL 6.7 6.5 6.5  Hemoglobin 12.0 - 15.0 g/dL 8.3(L) 9.8(L) 11.1(L)  Hematocrit 36.0 - 46.0 % 28.0(L) 33.2(L) 37.9  Platelets 150 - 400 K/uL 286 244 278   CMP Latest Ref Rng & Units 04/19/2021 12/29/2020 11/18/2020  Glucose 70 - 99 mg/dL 114(H) 101(H) 129(H)  BUN 8 - 23 mg/dL 19 21 26(H)  Creatinine 0.44 - 1.00 mg/dL 1.23(H) 0.96 0.98  Sodium 135 - 145 mmol/L 137 138 139  Potassium 3.5 - 5.1 mmol/L 4.3 3.9 3.2(L)  Chloride 98 - 111 mmol/L 100 98 97(L)  CO2 22 - 32 mmol/L 31 32 32  Calcium 8.9 - 10.3 mg/dL 9.5 9.2 11.4(H)  Total Protein 6.5 - 8.1 g/dL - - 7.4  Total Bilirubin 0.3 - 1.2 mg/dL - - 0.2(L)  Alkaline Phos 38 - 126 U/L - - 49  AST 15 - 41 U/L - - 17  ALT 0 - 44 U/L - - 11     No results found for: CEA1 / No results found for: CEA1 No results found for: PSA1 No results found for: PJP216 No results found for: CAN125  No results found for: TOTALPROTELP, ALBUMINELP, A1GS, A2GS, BETS, BETA2SER, GAMS, MSPIKE, SPEI Lab Results  Component Value Date   TIBC 406 12/29/2020   FERRITIN 17 12/29/2020   IRONPCTSAT 6 (L) 12/29/2020   No results found for: LDH  No results found for: AFPTUMOR, TOTALPROTELP, ALBUMINELP, A1GS, A2GS, BETS, BETA2SER, GAMS, MSPIKE, SPEI, LDH, CEA1, PSA1, IGASERUM, IGGSERUM, IGMSERUM,  THGAB, THYROGLB  Recent Review Flowsheet Data     Oncology Labs Latest Ref Rng & Units 12/29/2020   FERRITIN 11 - 307 ng/mL 17   IRONPCTSAT 10.4 - 31.8 % 6(L)       STUDIES:  No results found.   ASSESSMENT & PLAN:  A 87 y.o. female who I was asked to consult upon for *** .The patient understands all the plans discussed today and is in agreement with them.  I do appreciate [name], PA-C for his new consult.   Kennley Schwandt Macarthur Critchley, MD

## 2021-05-31 ENCOUNTER — Inpatient Hospital Stay: Payer: Medicare Other | Admitting: Oncology

## 2021-05-31 ENCOUNTER — Ambulatory Visit: Payer: Medicare Other

## 2021-05-31 ENCOUNTER — Encounter: Payer: Self-pay | Admitting: Oncology

## 2021-05-31 VITALS — BP 165/69 | HR 86 | Temp 98.4°F | Resp 16 | Ht 62.0 in | Wt 120.5 lb

## 2021-05-31 DIAGNOSIS — N189 Chronic kidney disease, unspecified: Secondary | ICD-10-CM

## 2021-05-31 DIAGNOSIS — D631 Anemia in chronic kidney disease: Secondary | ICD-10-CM | POA: Diagnosis not present

## 2021-05-31 NOTE — Progress Notes (Signed)
[facility]  [address] [place],  [place]  [address] [phone]  Clinic Day:  05/31/2021  Referring physician: [name], PA-C   HISTORY OF PRESENT ILLNESS:  The patient is an 87 y.o. female who I recently began seeing for anemia.  She comes in today to go over all of her labs collected recently to determine the etiology behind this.  Since her last visit, the patient has been doing okay.  She has fatigue, but denies that it has gotten worse since his last visit.    PHYSICAL EXAM:  Blood pressure (!) 165/69, pulse 86, temperature 98.4 F (36.9 C), resp. rate 16, height _0  (1.575 m), weight 120 lb 8 oz (54.7 kg), SpO2 96 %. Wt Readings from Last 3 Encounters:  05/31/21 120 lb 8 oz (54.7 kg)  05/22/21 122 lb 6.4 oz (55.5 kg)  04/19/21 120 lb 6.4 oz (54.6 kg)   Body mass index is 22.04 kg/m. Performance status (ECOG): 2 - Symptomatic, <50% confined to bed Physical Exam Constitutional:      Appearance: Normal appearance. She is ill-appearing (chronically ill; wearing oxygen per nasal canula).  HENT:     Mouth/Throat:     Mouth: Mucous membranes are moist.     Pharynx: Oropharynx is clear. No oropharyngeal exudate or posterior oropharyngeal erythema.  Cardiovascular:     Rate and Rhythm: Normal rate and regular rhythm.     Heart sounds: No murmur heard.   No friction rub. No gallop.  Pulmonary:     Effort: Pulmonary effort is normal. No respiratory distress.     Breath sounds: Normal breath sounds. No wheezing, rhonchi or rales.  Abdominal:     General: Bowel sounds are normal. There is no distension.     Palpations: Abdomen is soft. There is no mass.     Tenderness: There is no abdominal tenderness.  Musculoskeletal:        General: No swelling.     Right lower leg: No edema.     Left lower leg: No edema.  Lymphadenopathy:     Cervical: No cervical adenopathy.     Upper Body:     Right upper body: No supraclavicular or  axillary adenopathy.     Left upper body: No supraclavicular or axillary adenopathy.     Lower Body: No right inguinal adenopathy. No left inguinal adenopathy.  Skin:    General: Skin is warm.     Coloration: Skin is not jaundiced.     Findings: No lesion or rash.  Neurological:     General: No focal deficit present.     Mental Status: She is alert and oriented to person, place, and time. Mental status is at baseline.  Psychiatric:        Mood and Affect: Mood normal.        Behavior: Behavior normal.        Thought Content: Thought content normal.    LABS:   CBC Latest Ref Rng & Units 05/22/2021 11/18/2020 06/24/2020  WBC - 6.2 6.7 6.5  Hemoglobin 12.0 - 16.0 7.8(A) 8.3(L) 9.8(L)  Hematocrit 36 - 46 22(A) 28.0(L) 33.2(L)  Platelets 150 - 399 286 286 244   CMP Latest Ref Rng & Units 05/22/2021 04/19/2021 12/29/2020  Glucose 70 - 99 mg/dL - 114(H) 101(H)  BUN 4 - 21 34(A) 19 21  Creatinine 0.5 - 1.1 1.4(A) 1.23(H) 0.96  Sodium 137 - 147 139 137 138  Potassium 3.4 - 5.3 4.6  4.3 3.9  Chloride 99 - 108 98(A) 100 98  CO2 13 - 22 35(A) 31 32  Calcium 8.7 - 10.7 9.1 9.5 9.2  Total Protein 6.5 - 8.1 g/dL - - -  Total Bilirubin 0.3 - 1.2 mg/dL - - -  Alkaline Phos 25 - 125 61 - -  AST 13 - 35 26 - -  ALT 7 - 35 12 - -   Lab Results  Component Value Date   TOTALPROTELP 6.1 05/22/2021   ALBUMINELP 2.9 05/22/2021   A1GS 0.3 05/22/2021   A2GS 0.9 05/22/2021   BETS 1.0 05/22/2021   GAMS 1.0 05/22/2021   MSPIKE Not Observed 05/22/2021   SPEI Comment 05/22/2021   Lab Results  Component Value Date   TIBC 319 05/22/2021   TIBC 406 12/29/2020   FERRITIN 31 05/22/2021   FERRITIN 17 12/29/2020   IRONPCTSAT 12 05/22/2021   IRONPCTSAT 6 (L) 12/29/2020   Lab Results  Component Value Date   LDH 121 05/22/2021    Ref. Range 05/22/2021 15:20  Folate Latest Ref Range: >5.9 ng/mL 64.6  Vitamin B12 Latest Ref Range: 180 - 914 pg/mL 827    Ref. Range 05/22/2021 15:41  TSH Latest Ref  Range: 0.350 - 4.500 uIU/mL 0.706   ASSESSMENT & PLAN:  Assessment/Plan:  A 87 y.o. female with anemia.  Based upon all of her recent labs, the only abnormality seen was that she has mild kidney disease.  Her hemoglobin is substantially low at 7.8; I have a hard time believing her mild kidney disease is the only culprit behind her anemia.  Nevertheless, I will give her Retacrit 20,000 units this week, in 3 weeks and in 6 weeks.  I will see her back in 9 weeks for repeat clinical assessment.  If her hemoglobin remains substantially low at that time, a bone marrow biopsy would be done to rule out any intrinsic bone marrow disorder.  The patient understands all the plans discussed today and is in agreement with them.     Adean Milosevic Macarthur Critchley, MD

## 2021-06-05 ENCOUNTER — Inpatient Hospital Stay: Payer: Medicare Other | Attending: Oncology

## 2021-06-05 ENCOUNTER — Other Ambulatory Visit: Payer: Self-pay

## 2021-06-05 VITALS — BP 121/50 | HR 86 | Temp 98.1°F | Resp 18 | Ht 62.0 in | Wt 114.8 lb

## 2021-06-05 DIAGNOSIS — N183 Chronic kidney disease, stage 3 unspecified: Secondary | ICD-10-CM | POA: Insufficient documentation

## 2021-06-05 DIAGNOSIS — Z79899 Other long term (current) drug therapy: Secondary | ICD-10-CM | POA: Insufficient documentation

## 2021-06-05 DIAGNOSIS — D631 Anemia in chronic kidney disease: Secondary | ICD-10-CM | POA: Diagnosis not present

## 2021-06-05 MED ORDER — EPOETIN ALFA-EPBX 10000 UNIT/ML IJ SOLN
10000.0000 [IU] | Freq: Once | INTRAMUSCULAR | Status: AC
  Administered 2021-06-05: 10000 [IU] via SUBCUTANEOUS
  Filled 2021-06-05: qty 1

## 2021-06-05 NOTE — Patient Instructions (Signed)
Epoetin Alfa injection What is this medication? EPOETIN ALFA (e POE e tin AL fa) helps your body make more red blood cells. This medicine is used to treat anemia caused by chronic kidney disease, cancer chemotherapy, or HIV-therapy. It may also be used before surgery if you have anemia. This medicine may be used for other purposes; ask your health care provider or pharmacist if you have questions. COMMON BRAND NAME(S): Epogen, Procrit, Retacrit What should I tell my care team before I take this medication? They need to know if you have any of these conditions: cancer heart disease high blood pressure history of blood clots history of stroke low levels of folate, iron, or vitamin B12 in the blood seizures an unusual or allergic reaction to erythropoietin, albumin, benzyl alcohol, hamster proteins, other medicines, foods, dyes, or preservatives pregnant or trying to get pregnant breast-feeding How should I use this medication? This medicine is for injection into a vein or under the skin. It is usually given by a health care professional in a hospital or clinic setting. If you get this medicine at home, you will be taught how to prepare and give this medicine. Use exactly as directed. Take your medicine at regular intervals. Do not take your medicine more often than directed. It is important that you put your used needles and syringes in a special sharps container. Do not put them in a trash can. If you do not have a sharps container, call your pharmacist or healthcare provider to get one. A special MedGuide will be given to you by the pharmacist with each prescription and refill. Be sure to read this information carefully each time. Talk to your pediatrician regarding the use of this medicine in children. While this drug may be prescribed for selected conditions, precautions do apply. Overdosage: If you think you have taken too much of this medicine contact a poison control center or emergency  room at once. NOTE: This medicine is only for you. Do not share this medicine with others. What if I miss a dose? If you miss a dose, take it as soon as you can. If it is almost time for your next dose, take only that dose. Do not take double or extra doses. What may interact with this medication? Interactions have not been studied. This list may not describe all possible interactions. Give your health care provider a list of all the medicines, herbs, non-prescription drugs, or dietary supplements you use. Also tell them if you smoke, drink alcohol, or use illegal drugs. Some items may interact with your medicine. What should I watch for while using this medication? Your condition will be monitored carefully while you are receiving this medicine. You may need blood work done while you are taking this medicine. This medicine may cause a decrease in vitamin B6. You should make sure that you get enough vitamin B6 while you are taking this medicine. Discuss the foods you eat and the vitamins you take with your health care professional. What side effects may I notice from receiving this medication? Side effects that you should report to your doctor or health care professional as soon as possible: allergic reactions like skin rash, itching or hives, swelling of the face, lips, or tongue seizures signs and symptoms of a blood clot such as breathing problems; changes in vision; chest pain; severe, sudden headache; pain, swelling, warmth in the leg; trouble speaking; sudden numbness or weakness of the face, arm or leg signs and symptoms of a stroke like  changes in vision; confusion; trouble speaking or understanding; severe headaches; sudden numbness or weakness of the face, arm or leg; trouble walking; dizziness; loss of balance or coordination Side effects that usually do not require medical attention (report to your doctor or health care professional if they continue or are  bothersome): chills cough dizziness fever headaches joint pain muscle cramps muscle pain nausea, vomiting pain, redness, or irritation at site where injected This list may not describe all possible side effects. Call your doctor for medical advice about side effects. You may report side effects to FDA at 1-800-FDA-1088. Where should I keep my medication? Keep out of the reach of children. Store in a refrigerator between 2 and 8 degrees C (36 and 46 degrees F). Do not freeze or shake. Throw away any unused portion if using a single-dose vial. Multi-dose vials can be kept in the refrigerator for up to 21 days after the initial dose. Throw away unused medicine. NOTE: This sheet is a summary. It may not cover all possible information. If you have questions about this medicine, talk to your doctor, pharmacist, or health care provider.  2022 Elsevier/Gold Standard (2017-02-28 08:35:19)

## 2021-06-07 ENCOUNTER — Telehealth: Payer: Self-pay | Admitting: Oncology

## 2021-06-07 NOTE — Telephone Encounter (Signed)
Patient's daughter called to verify upcoming Appt's

## 2021-06-15 DIAGNOSIS — R0902 Hypoxemia: Secondary | ICD-10-CM | POA: Diagnosis not present

## 2021-06-19 ENCOUNTER — Telehealth (HOSPITAL_COMMUNITY): Payer: Self-pay | Admitting: Pharmacy Technician

## 2021-06-19 NOTE — Telephone Encounter (Addendum)
Advanced Heart Failure Patient Advocate Encounter  Sent in prescriber's portion of Orenitram Assist application via fax.  Charlann Boxer, CPhT

## 2021-06-20 DIAGNOSIS — Z79899 Other long term (current) drug therapy: Secondary | ICD-10-CM | POA: Diagnosis not present

## 2021-06-20 DIAGNOSIS — M549 Dorsalgia, unspecified: Secondary | ICD-10-CM | POA: Diagnosis not present

## 2021-06-20 DIAGNOSIS — Z1389 Encounter for screening for other disorder: Secondary | ICD-10-CM | POA: Diagnosis not present

## 2021-06-20 DIAGNOSIS — M4800 Spinal stenosis, site unspecified: Secondary | ICD-10-CM | POA: Diagnosis not present

## 2021-06-26 ENCOUNTER — Inpatient Hospital Stay: Payer: Medicare Other

## 2021-06-26 ENCOUNTER — Other Ambulatory Visit: Payer: Self-pay | Admitting: Hematology and Oncology

## 2021-06-26 ENCOUNTER — Other Ambulatory Visit: Payer: Self-pay

## 2021-06-26 VITALS — BP 126/50 | HR 68 | Temp 98.3°F | Resp 18 | Ht 62.0 in | Wt 117.1 lb

## 2021-06-26 DIAGNOSIS — D539 Nutritional anemia, unspecified: Secondary | ICD-10-CM

## 2021-06-26 DIAGNOSIS — Z79899 Other long term (current) drug therapy: Secondary | ICD-10-CM | POA: Diagnosis not present

## 2021-06-26 DIAGNOSIS — N183 Chronic kidney disease, stage 3 unspecified: Secondary | ICD-10-CM | POA: Diagnosis not present

## 2021-06-26 DIAGNOSIS — D649 Anemia, unspecified: Secondary | ICD-10-CM | POA: Diagnosis not present

## 2021-06-26 DIAGNOSIS — D631 Anemia in chronic kidney disease: Secondary | ICD-10-CM | POA: Diagnosis not present

## 2021-06-26 LAB — CBC AND DIFFERENTIAL
HCT: 26 — AB (ref 36–46)
Hemoglobin: 8.2 — AB (ref 12.0–16.0)
Neutrophils Absolute: 3.94
Platelets: 282 (ref 150–399)
WBC: 6.8

## 2021-06-26 LAB — CBC
MCV: 86 (ref 81–99)
RBC: 3.02 — AB (ref 3.87–5.11)

## 2021-06-26 LAB — TSH: TSH: 1.267 u[IU]/mL (ref 0.350–4.500)

## 2021-06-26 MED ORDER — EPOETIN ALFA-EPBX 10000 UNIT/ML IJ SOLN
10000.0000 [IU] | Freq: Once | INTRAMUSCULAR | Status: AC
  Administered 2021-06-26: 10000 [IU] via SUBCUTANEOUS
  Filled 2021-06-26: qty 1

## 2021-06-26 NOTE — Patient Instructions (Signed)
Epoetin Alfa injection What is this medication? EPOETIN ALFA (e POE e tin AL fa) helps your body make more red blood cells. This medicine is used to treat anemia caused by chronic kidney disease, cancer chemotherapy, or HIV-therapy. It may also be used before surgery if you have anemia. This medicine may be used for other purposes; ask your health care provider or pharmacist if you have questions. COMMON BRAND NAME(S): Epogen, Procrit, Retacrit What should I tell my care team before I take this medication? They need to know if you have any of these conditions: cancer heart disease high blood pressure history of blood clots history of stroke low levels of folate, iron, or vitamin B12 in the blood seizures an unusual or allergic reaction to erythropoietin, albumin, benzyl alcohol, hamster proteins, other medicines, foods, dyes, or preservatives pregnant or trying to get pregnant breast-feeding How should I use this medication? This medicine is for injection into a vein or under the skin. It is usually given by a health care professional in a hospital or clinic setting. If you get this medicine at home, you will be taught how to prepare and give this medicine. Use exactly as directed. Take your medicine at regular intervals. Do not take your medicine more often than directed. It is important that you put your used needles and syringes in a special sharps container. Do not put them in a trash can. If you do not have a sharps container, call your pharmacist or healthcare provider to get one. A special MedGuide will be given to you by the pharmacist with each prescription and refill. Be sure to read this information carefully each time. Talk to your pediatrician regarding the use of this medicine in children. While this drug may be prescribed for selected conditions, precautions do apply. Overdosage: If you think you have taken too much of this medicine contact a poison control center or emergency  room at once. NOTE: This medicine is only for you. Do not share this medicine with others. What if I miss a dose? If you miss a dose, take it as soon as you can. If it is almost time for your next dose, take only that dose. Do not take double or extra doses. What may interact with this medication? Interactions have not been studied. This list may not describe all possible interactions. Give your health care provider a list of all the medicines, herbs, non-prescription drugs, or dietary supplements you use. Also tell them if you smoke, drink alcohol, or use illegal drugs. Some items may interact with your medicine. What should I watch for while using this medication? Your condition will be monitored carefully while you are receiving this medicine. You may need blood work done while you are taking this medicine. This medicine may cause a decrease in vitamin B6. You should make sure that you get enough vitamin B6 while you are taking this medicine. Discuss the foods you eat and the vitamins you take with your health care professional. What side effects may I notice from receiving this medication? Side effects that you should report to your doctor or health care professional as soon as possible: allergic reactions like skin rash, itching or hives, swelling of the face, lips, or tongue seizures signs and symptoms of a blood clot such as breathing problems; changes in vision; chest pain; severe, sudden headache; pain, swelling, warmth in the leg; trouble speaking; sudden numbness or weakness of the face, arm or leg signs and symptoms of a stroke like  changes in vision; confusion; trouble speaking or understanding; severe headaches; sudden numbness or weakness of the face, arm or leg; trouble walking; dizziness; loss of balance or coordination Side effects that usually do not require medical attention (report to your doctor or health care professional if they continue or are  bothersome): chills cough dizziness fever headaches joint pain muscle cramps muscle pain nausea, vomiting pain, redness, or irritation at site where injected This list may not describe all possible side effects. Call your doctor for medical advice about side effects. You may report side effects to FDA at 1-800-FDA-1088. Where should I keep my medication? Keep out of the reach of children. Store in a refrigerator between 2 and 8 degrees C (36 and 46 degrees F). Do not freeze or shake. Throw away any unused portion if using a single-dose vial. Multi-dose vials can be kept in the refrigerator for up to 21 days after the initial dose. Throw away unused medicine. NOTE: This sheet is a summary. It may not cover all possible information. If you have questions about this medicine, talk to your doctor, pharmacist, or health care provider.  2022 Elsevier/Gold Standard (2017-03-25 00:00:00)

## 2021-07-02 ENCOUNTER — Telehealth: Payer: Self-pay

## 2021-07-02 NOTE — Telephone Encounter (Addendum)
Pt's dtr, Joy, LVM on nurse line asking if pt is ti stay on iron? They weren't quite sure.  Dr Bobby Rumpf states, "She can take 1 iron tab per day if she would like". I notified pt's dtr.

## 2021-07-03 DIAGNOSIS — L570 Actinic keratosis: Secondary | ICD-10-CM | POA: Diagnosis not present

## 2021-07-03 DIAGNOSIS — L578 Other skin changes due to chronic exposure to nonionizing radiation: Secondary | ICD-10-CM | POA: Diagnosis not present

## 2021-07-03 DIAGNOSIS — L814 Other melanin hyperpigmentation: Secondary | ICD-10-CM | POA: Diagnosis not present

## 2021-07-06 ENCOUNTER — Other Ambulatory Visit (HOSPITAL_COMMUNITY): Payer: Self-pay | Admitting: *Deleted

## 2021-07-06 MED ORDER — TREPROSTINIL DIOLAMINE ER 0.125 MG PO TBCR: 0.2500 mg | EXTENDED_RELEASE_TABLET | Freq: Three times a day (TID) | ORAL | 6 refills | Status: DC

## 2021-07-15 DIAGNOSIS — R0902 Hypoxemia: Secondary | ICD-10-CM | POA: Diagnosis not present

## 2021-07-17 ENCOUNTER — Inpatient Hospital Stay: Payer: Medicare Other

## 2021-07-17 ENCOUNTER — Other Ambulatory Visit: Payer: Self-pay

## 2021-07-17 ENCOUNTER — Inpatient Hospital Stay: Payer: Medicare Other | Attending: Oncology

## 2021-07-17 ENCOUNTER — Other Ambulatory Visit: Payer: Self-pay | Admitting: Hematology and Oncology

## 2021-07-17 VITALS — BP 133/74 | HR 72 | Temp 98.2°F | Resp 18 | Ht 62.0 in | Wt 118.0 lb

## 2021-07-17 DIAGNOSIS — Z79899 Other long term (current) drug therapy: Secondary | ICD-10-CM | POA: Diagnosis not present

## 2021-07-17 DIAGNOSIS — N183 Chronic kidney disease, stage 3 unspecified: Secondary | ICD-10-CM | POA: Diagnosis not present

## 2021-07-17 DIAGNOSIS — D631 Anemia in chronic kidney disease: Secondary | ICD-10-CM | POA: Diagnosis not present

## 2021-07-17 DIAGNOSIS — D649 Anemia, unspecified: Secondary | ICD-10-CM

## 2021-07-17 LAB — COMPREHENSIVE METABOLIC PANEL
Albumin: 3.6 (ref 3.5–5.0)
Calcium: 9 (ref 8.7–10.7)

## 2021-07-17 LAB — HEPATIC FUNCTION PANEL
ALT: 15 (ref 7–35)
AST: 26 (ref 13–35)
Alkaline Phosphatase: 59 (ref 25–125)
Bilirubin, Total: 0.2

## 2021-07-17 LAB — BASIC METABOLIC PANEL
BUN: 37 — AB (ref 4–21)
CO2: 33 — AB (ref 13–22)
Chloride: 95 — AB (ref 99–108)
Creatinine: 1.4 — AB (ref 0.5–1.1)
Glucose: 133
Potassium: 5 (ref 3.4–5.3)
Sodium: 134 — AB (ref 137–147)

## 2021-07-17 LAB — CBC
MCV: 85 (ref 81–99)
RBC: 3.21 — AB (ref 3.87–5.11)

## 2021-07-17 LAB — TSH: TSH: 1.16 u[IU]/mL (ref 0.350–4.500)

## 2021-07-17 LAB — CBC AND DIFFERENTIAL
HCT: 27 — AB (ref 36–46)
Hemoglobin: 8.6 — AB (ref 12.0–16.0)
Neutrophils Absolute: 3.13
Platelets: 251 (ref 150–399)
WBC: 5.8

## 2021-07-17 MED ORDER — EPOETIN ALFA-EPBX 10000 UNIT/ML IJ SOLN
10000.0000 [IU] | Freq: Once | INTRAMUSCULAR | Status: AC
  Administered 2021-07-17: 10000 [IU] via SUBCUTANEOUS
  Filled 2021-07-17: qty 1

## 2021-07-17 NOTE — Patient Instructions (Signed)
Epoetin Alfa injection What is this medication? EPOETIN ALFA (e POE e tin AL fa) helps your body make more red blood cells. This medicine is used to treat anemia caused by chronic kidney disease, cancer chemotherapy, or HIV-therapy. It may also be used before surgery if you have anemia. This medicine may be used for other purposes; ask your health care provider or pharmacist if you have questions. COMMON BRAND NAME(S): Epogen, Procrit, Retacrit What should I tell my care team before I take this medication? They need to know if you have any of these conditions: cancer heart disease high blood pressure history of blood clots history of stroke low levels of folate, iron, or vitamin B12 in the blood seizures an unusual or allergic reaction to erythropoietin, albumin, benzyl alcohol, hamster proteins, other medicines, foods, dyes, or preservatives pregnant or trying to get pregnant breast-feeding How should I use this medication? This medicine is for injection into a vein or under the skin. It is usually given by a health care professional in a hospital or clinic setting. If you get this medicine at home, you will be taught how to prepare and give this medicine. Use exactly as directed. Take your medicine at regular intervals. Do not take your medicine more often than directed. It is important that you put your used needles and syringes in a special sharps container. Do not put them in a trash can. If you do not have a sharps container, call your pharmacist or healthcare provider to get one. A special MedGuide will be given to you by the pharmacist with each prescription and refill. Be sure to read this information carefully each time. Talk to your pediatrician regarding the use of this medicine in children. While this drug may be prescribed for selected conditions, precautions do apply. Overdosage: If you think you have taken too much of this medicine contact a poison control center or emergency  room at once. NOTE: This medicine is only for you. Do not share this medicine with others. What if I miss a dose? If you miss a dose, take it as soon as you can. If it is almost time for your next dose, take only that dose. Do not take double or extra doses. What may interact with this medication? Interactions have not been studied. This list may not describe all possible interactions. Give your health care provider a list of all the medicines, herbs, non-prescription drugs, or dietary supplements you use. Also tell them if you smoke, drink alcohol, or use illegal drugs. Some items may interact with your medicine. What should I watch for while using this medication? Your condition will be monitored carefully while you are receiving this medicine. You may need blood work done while you are taking this medicine. This medicine may cause a decrease in vitamin B6. You should make sure that you get enough vitamin B6 while you are taking this medicine. Discuss the foods you eat and the vitamins you take with your health care professional. What side effects may I notice from receiving this medication? Side effects that you should report to your doctor or health care professional as soon as possible: allergic reactions like skin rash, itching or hives, swelling of the face, lips, or tongue seizures signs and symptoms of a blood clot such as breathing problems; changes in vision; chest pain; severe, sudden headache; pain, swelling, warmth in the leg; trouble speaking; sudden numbness or weakness of the face, arm or leg signs and symptoms of a stroke like  changes in vision; confusion; trouble speaking or understanding; severe headaches; sudden numbness or weakness of the face, arm or leg; trouble walking; dizziness; loss of balance or coordination Side effects that usually do not require medical attention (report to your doctor or health care professional if they continue or are  bothersome): chills cough dizziness fever headaches joint pain muscle cramps muscle pain nausea, vomiting pain, redness, or irritation at site where injected This list may not describe all possible side effects. Call your doctor for medical advice about side effects. You may report side effects to FDA at 1-800-FDA-1088. Where should I keep my medication? Keep out of the reach of children. Store in a refrigerator between 2 and 8 degrees C (36 and 46 degrees F). Do not freeze or shake. Throw away any unused portion if using a single-dose vial. Multi-dose vials can be kept in the refrigerator for up to 21 days after the initial dose. Throw away unused medicine. NOTE: This sheet is a summary. It may not cover all possible information. If you have questions about this medicine, talk to your doctor, pharmacist, or health care provider.  2022 Elsevier/Gold Standard (2017-03-25 00:00:00)

## 2021-07-18 ENCOUNTER — Telehealth (HOSPITAL_COMMUNITY): Payer: Self-pay | Admitting: Pharmacist

## 2021-07-18 NOTE — Telephone Encounter (Signed)
Patient Advocate Encounter   Received notification from [employer] that prior authorizations for Opsumit, Adempas and Orenitram are required.   PA submitted on CoverMyMeds Status is pending   Will continue to follow.   Audry Riles, PharmD, BCPS, BCCP, CPP Heart Failure Clinic Pharmacist [phone]

## 2021-07-18 NOTE — Telephone Encounter (Signed)
Advanced Heart Failure Patient Advocate Encounter  Prior Authorizations for Opsumit, Adempas and Orenitram have been approved.    Effective dates: 08/05/21 through 08/04/22  Audry Riles, PharmD, BCPS, BCCP, CPP Heart Failure Clinic Pharmacist [phone]

## 2021-07-19 ENCOUNTER — Encounter (HOSPITAL_COMMUNITY): Payer: Self-pay | Admitting: Cardiology

## 2021-07-19 ENCOUNTER — Ambulatory Visit (HOSPITAL_COMMUNITY)
Admission: RE | Admit: 2021-07-19 | Discharge: 2021-07-19 | Disposition: A | Discharge status: Home / Self Care | Payer: Medicare Other | Source: Ambulatory Visit | Attending: Cardiology | Admitting: Cardiology

## 2021-07-19 ENCOUNTER — Other Ambulatory Visit: Payer: Self-pay

## 2021-07-19 VITALS — BP 122/60 | HR 86 | Wt 115.6 lb

## 2021-07-19 DIAGNOSIS — Z955 Presence of coronary angioplasty implant and graft: Secondary | ICD-10-CM | POA: Diagnosis not present

## 2021-07-19 DIAGNOSIS — I5032 Chronic diastolic (congestive) heart failure: Secondary | ICD-10-CM | POA: Diagnosis not present

## 2021-07-19 DIAGNOSIS — I50810 Right heart failure, unspecified: Secondary | ICD-10-CM | POA: Diagnosis not present

## 2021-07-19 DIAGNOSIS — I2510 Atherosclerotic heart disease of native coronary artery without angina pectoris: Secondary | ICD-10-CM | POA: Insufficient documentation

## 2021-07-19 DIAGNOSIS — Z09 Encounter for follow-up examination after completed treatment for conditions other than malignant neoplasm: Secondary | ICD-10-CM | POA: Diagnosis not present

## 2021-07-19 DIAGNOSIS — Z79899 Other long term (current) drug therapy: Secondary | ICD-10-CM | POA: Insufficient documentation

## 2021-07-19 DIAGNOSIS — D509 Iron deficiency anemia, unspecified: Secondary | ICD-10-CM | POA: Diagnosis not present

## 2021-07-19 DIAGNOSIS — Z7982 Long term (current) use of aspirin: Secondary | ICD-10-CM | POA: Insufficient documentation

## 2021-07-19 DIAGNOSIS — Z9981 Dependence on supplemental oxygen: Secondary | ICD-10-CM | POA: Diagnosis not present

## 2021-07-19 DIAGNOSIS — I110 Hypertensive heart disease with heart failure: Secondary | ICD-10-CM | POA: Insufficient documentation

## 2021-07-19 DIAGNOSIS — E1169 Type 2 diabetes mellitus with other specified complication: Secondary | ICD-10-CM | POA: Diagnosis not present

## 2021-07-19 DIAGNOSIS — Z87891 Personal history of nicotine dependence: Secondary | ICD-10-CM | POA: Diagnosis not present

## 2021-07-19 DIAGNOSIS — I272 Pulmonary hypertension, unspecified: Secondary | ICD-10-CM | POA: Diagnosis not present

## 2021-07-19 NOTE — Progress Notes (Signed)
Attempted to complete 6MW however patient tripped over feet/oxygen tubing walking to area/hallway for testing. Pt reported she was ok despite fall.was able to break some of her fall with hall railing. Per Dr Aundra Dubin cancel walk test, call office if she started to feel bad, increased dizziness.

## 2021-07-19 NOTE — Patient Instructions (Signed)
STOP Potassium  Labs needed in 10-14 days at PCP office   Your physician recommends that you schedule a follow-up appointment in: 3 months with Dr Aundra Dubin  Do the following things EVERYDAY: Weigh yourself in the morning before breakfast. Write it down and keep it in a log. Take your medicines as prescribed Eat low salt foods--Limit salt (sodium) to 2000 mg per day.  Stay as active as you can everyday Limit all fluids for the day to less than 2 liters  At the [facility], you and your health needs are our priority. As part of our continuing mission to provide you with exceptional heart care, we have created designated Provider Care Teams. These Care Teams include your primary Cardiologist (physician) and Advanced Practice Providers (APPs- Physician Assistants and Nurse Practitioners) who all work together to provide you with the care you need, when you need it.   You may see any of the following providers on your designated Care Team at your next follow up: Dr Glori Bickers Dr Haynes Kerns, NP Lyda Jester, [place] [facility] [place], [place] Audry Riles, PharmD   Please be sure to bring in all your medications bottles to every appointment.    If you have any questions or concerns before your next appointment please send [place] a message through [facility] or call our office at [phone].    TO LEAVE A MESSAGE FOR THE NURSE SELECT OPTION 2, PLEASE LEAVE A MESSAGE INCLUDING: YOUR NAME DATE OF BIRTH CALL BACK NUMBER REASON FOR CALL**this is important as we prioritize the call backs  YOU WILL RECEIVE A CALL BACK THE SAME DAY AS LONG AS YOU CALL BEFORE 4:00 PM

## 2021-07-21 NOTE — Progress Notes (Signed)
ID:  [name], DOB 06-04-1932, MRN [id number]   Provider location: [place] Advanced Heart Failure Type of Visit: Established patient   PCP:  [name], PA-C  Cardiologist:  Jenkins Rouge, MD HF Cardiology: Dr. Aundra Dubin   History of Present Illness: [name] is an 87 y.o. with history of DM, HTN, and prior osteomyelitis of jaw referred by Dr. Johnsie Cancel for evaluation of pulmonary hypertension/RV failure.  She reports an episode of PNA in 3/19 and says that she never recovered from this.  She had gradually progressive exertional dyspnea to the point where she was short of breath walking around the house.  She had an echo in 7/19 that showed mildly decreased LV systolic function but severely decreased RV systolic function with dilated RV and at least moderate pulmonary hypertension by echo estimation.  She saw her PCP in 8/19 and was noted to be hypoxemic at rest.  He started her on home oxygen 2 L/[place] which she continues.    She was referred to cardiology, saw Dr. Johnsie Cancel. Amlodipine was stopped and she was started empirically on sildenafil 20 mg tid for pulmonary hypertension.   She was referred to CHF clinic.    RHC/LHC was done in 10/19, showing complex severe bifurcation lesion involving the proximal LAD and large D2 ostium. She had complex PCI requiring atherectomy and DES to pLAD and ostial D2.  RHC showed PAH with PVR 11.6 WU and CI 1.7.  She subsequently was found to have a right groin pseudoaneurysm from cath.   She was started on Opsumit.  Serologies have suggested CREST syndrome.  However, she saw Dr. Amil Amen for rheumatology who told her that he did not think that she had a rheumatologic diagnosis (per her report).   Cardiolite was abnormal in 3/20, this was followed by [facility] in 3/20 that showed nonobstructive CAD.  Echo in 3/20 showed EF 55-60%, with moderately decreased RV systolic function, PASP 64 mmHg.   Echo (3/21) showed EF 55-60%, moderately dilated RV with  moderately decreased systolic function, PASP 38 mmHg. Echo in 5/22 showed EF 55-60%, basal inferior akinesis, mildly dilated RV with moderately decreased systolic function, D-shaped septum.   She returns today for followup of pulmonary hypertension and CAD.  She continues to wear home oxygen 4L [place].  Main issue recently has been anemia.  She has had feraheme.  Per her report, stool was checked for blood and was negative.  Most recent hgb was 8.3.  She has been started on erythropoeitin by hematology.  Weight is down.  She was unable to increase orenitram due to nausea.  She has been feeling better recently with more energy. She can walk around the house without dyspnea, gets short of breath walking long distances. No chest pain, no lightheadedness.  She tripped over her oxygen cord and fell (no injury), so we did not do a 6 minute walk today.   6 minute walk (12/19): 79 m 6 minute walk (1/20): 121 m 6 minute walk (2/21): 91 m 6 minute walk (9/21): 152 m 6 minute walk (2/22): 152 m 6 minute walk (5/22): 259 m  Labs (12/19): K 3.9, creatinine 0.78 Labs (3/20): K 4, creatinine 1.1, hgb 10.2 Labs (12/20): LDL 67, TGs 28, K 4.4, creatinine 1.04 Labs (2/21): BNP 181, K 4, creatinine 0.94 Labs (5/21): K 4.2, creatinine 0.94, BNP 202 Labs (8/21): hgb 10.4 Labs (9/21): LDL 38, TGs 289 Labs (11/21): K 3.9, creatinine 0.84 Labs (12/21): K 4.9, creatinine 0.98,  LDL 48, TGs 209, BNP 197 Labs (4/22): BNP 218, K 3.2, creatinine 0.98, hgb 8.3 Labs (6/22): K 4.8, creatinine 1.13 Labs (9/22): hgb 8.5 Labs (12/22): hgb 8.3, TSH normal, K 5, creatinine 1.4  PMH: 1. H/o osteomyelitis of jaw.  2. Duodenal ulcers with upper GI bleeding. 3. Type 2 diabetes 4. Hyperlipidemia 5. Squamous cell carcinoma of scalp with radiation and surgery.  6. GERD 7. H/o back surgery  8. HTN 9. Pulmonary hypertension/RV failure: Echo (7/19) with EF 45-50%, moderate focal basal septal hypertrophy, mild diffuse hypokinesis,  septal flattening, severe RV dilation with severely decreased systolic function, moderate TR, PASP 61 mmHg.   - CT chest (7/19): Mild emphysema, enlarged PA.  - V/Q scan (8/19): No evidence for chronic PE.  - anti-centromere Ab positive, ANA positive, RF 73 => suspect CREST syndrome.  - RHC (10/19): mean RA 7, PA 64/21 mean 38, mean PCWP 8, CI 1.7, PVR 11.6 WU.  - Echo (3/20): EF 55-60%, moderately decreased RV systolic function, PASP 64 mmHg - Echo (3/21): EF 55-60%, moderately dilated RV with moderately decreased systolic function, PASP 38 mmHg. - Echo (5/22): EF 55-60%, basal inferior akinesis, mildly dilated RV with moderately decreased systolic function, D-shaped septum. 10. CAD: LHC (10/19) with 95% proximal LAD and 95% ostial D2.  Complicated bifurcation intervention with atherectomy and DES to proximal LAD and ostial D2.   - LHC (3/20): 40% mLAD, 30% mRCA.  11. Post-cath right groin pseudoaneurysm in 10/19.  12. Anemia: Fe deficiency.  FOBT negative.   SH: Lives in [place] alone, daughter lives very close.  Quit smoking > 20 years ago. No ETOH.   FH: No history of pulmonary hypertension.  Mother with "heart problems."  Sister with PPM.    Review of systems complete and found to be negative unless listed in HPI.   Current Outpatient Medications  Medication Sig Dispense Refill   ALPRAZolam (XANAX) 0.5 MG tablet Take 0.5 mg by mouth daily as needed for anxiety.      aspirin EC 81 MG tablet Take 1 tablet (81 mg total) by mouth daily. 30 tablet 11   atorvastatin (LIPITOR) 80 MG tablet Take 1 tablet (80 mg total) by mouth daily. 90 tablet 3   carvedilol (COREG) 3.125 MG tablet TAKE 1 TABLET BY MOUTH TWICE DAILY WITH A MEAL 60 tablet 11   cyclobenzaprine (FLEXERIL) 10 MG tablet Take 10 mg by mouth in the morning.     Epoetin Alfa-epbx (RETACRIT IJ) Inject 1 Dose as directed every 21 ( twenty-one) days.     FERROUS SULFATE PO Take 40 mg by mouth daily.     furosemide (LASIX) 40 MG tablet  TAKE 1 TABLET BY MOUTH EVERY MORNING, AND 1/2 TABLET IN THE EVENING 45 tablet 11   gabapentin (NEURONTIN) 300 MG capsule Take 300 mg by mouth as needed.     lactose free nutrition (BOOST) LIQD Take 237 mLs by mouth 2 (two) times daily between meals.      mirtazapine (REMERON) 30 MG tablet Take 30 mg by mouth at bedtime.     OPSUMIT 10 MG tablet Take 1 tablet (10 mg total) by mouth daily. 90 tablet 5   Riociguat (ADEMPAS) 2.5 MG TABS Take 2.5 mg by mouth in the morning, at noon, and at bedtime. 90 tablet 3   traMADol-acetaminophen (ULTRACET) 37.5-325 MG tablet Take 2 tablets by mouth 4 (four) times daily as needed for pain.  2   Treprostinil Diolamine ER 0.125 MG TBCR Take 1  tablet by mouth in the morning, at noon, and at bedtime.     No current facility-administered medications for this encounter.   BP 122/60    Pulse 86    Wt 52.4 kg (115 lb 9.6 oz)    SpO2 100% Comment: 3 l n/c   BMI 21.14 kg/m   Wt Readings from Last 3 Encounters:  07/19/21 52.4 kg (115 lb 9.6 oz)  07/17/21 53.5 kg (118 lb)  06/26/21 53.1 kg (117 lb 1.9 oz)   BP 122/60    Pulse 86    Wt 52.4 kg (115 lb 9.6 oz)    SpO2 100% Comment: 3 l n/c   BMI 21.14 kg/m  General: NAD Neck: No JVD, no thyromegaly or thyroid nodule.  Lungs: Dry crackles at bases.  CV: Nondisplaced PMI.  Heart regular S1/S2, no S3/S4, no murmur.  No peripheral edema.  No carotid bruit.  Normal pedal pulses.  Abdomen: Soft, nontender, no hepatosplenomegaly, no distention.  Skin: Intact without lesions or rashes.  Neurologic: Alert and oriented x 3.  Psych: Normal affect. Extremities: No clubbing or cyanosis.  HEENT: Normal.   Assessment/Plan: 1. CAD: Coronary angiography showed complex bifurcation lesion with 95% stenosis proximal LAD with calcification at take-off of large D2. The ostial/proximal D2 also had 95% calcified stenosis. Suspect this lesion played a role in her exertional dyspnea and also in her mildly decreased LV systolic function (EF  07-22% on echo in 7/19).  S/p successful atherectomy with bifurcation stenting of proximal LAD and D2 05/15/18.  Repeat LHC in 3/20 showed nonobstructive disease.  No chest pain.  - Continue ASA 81.  - Plavix stopped with anemia.   - Continue atorvastatin, check lipids next appt.  - Continue Vascepa.   2. Pulmonary hypertension: She appeared to have at least moderate pulmonary hypertension by echo in 7/19 with RV failure.  CT chest showed mild emphysema, which should not explain her degree of RV failure.  V/Q scan did not show evidence for chronic PEs. Anti-centromere antibody, ANA, and RF were all positive.  ?CREST variant of scleroderma.  She is on home oxygen.  [place] 05/15/18 showed moderate PAH but very high PVR and low cardiac output.  This was concerning for advanced pulmonary hypertension.  Repeat echo in 3/21 showed moderately dilated/dysfunctional RV but PASP estimate was lower.  Echo in 5/22 showed mildly dilated/moderately dysfunctional RV, TR doppler jet not complete so probably not accurate PA pressure estimate.  She did not tolerate Uptravi.  Symptomatically, NYHA class II-III, stable.  Unable to do 6 minute walk today as she tripped and fell over her oxygen cord (no injury).  - Continue riociguat, now on goal dose.  - Continue Opsumit 10 mg daily.  - Continue orenitram, she is unable to titrate any higher due to GI side effects.   - Refused sleep study.  - Continue home oxygen.  - BMET/BNP today.   3. Chronic diastolic CHF/RV failure:  NYHA II-III.  She does not appear volume overloaded.  - Continue Lasix 40 qam/20 qpm.     - Stop KCl with mild hyperkalemia.  4. ?CREST syndrome: Patient saw [facility]ology. Per her report, they did not think that she had CREST syndrome.    5. Anemia: Fe deficiency.  No overt GI bleeding, per her report FOBT was negative.  She is being followed by hematology and is on erythropoeitin.  It is possible that the anemia comes from Opsumit or Adempas.   However, she has improved symptomatically  with these medications.  Would like to continue unless hematologist feels strongly about trying her off of one of them.  - Has appt with hematology again soon.   Followup in 3 months   [name] 07/21/2021

## 2021-07-23 DIAGNOSIS — M549 Dorsalgia, unspecified: Secondary | ICD-10-CM | POA: Diagnosis not present

## 2021-07-23 DIAGNOSIS — G894 Chronic pain syndrome: Secondary | ICD-10-CM | POA: Diagnosis not present

## 2021-07-23 DIAGNOSIS — M4800 Spinal stenosis, site unspecified: Secondary | ICD-10-CM | POA: Diagnosis not present

## 2021-07-23 DIAGNOSIS — Z1389 Encounter for screening for other disorder: Secondary | ICD-10-CM | POA: Diagnosis not present

## 2021-07-23 DIAGNOSIS — Z79899 Other long term (current) drug therapy: Secondary | ICD-10-CM | POA: Diagnosis not present

## 2021-07-24 ENCOUNTER — Other Ambulatory Visit (HOSPITAL_COMMUNITY): Payer: Self-pay | Admitting: *Deleted

## 2021-07-24 MED ORDER — ADEMPAS 2.5 MG PO TABS: 2.5000 mg | ORAL_TABLET | Freq: Three times a day (TID) | ORAL | 3 refills | Status: AC

## 2021-07-27 ENCOUNTER — Telehealth (HOSPITAL_COMMUNITY): Payer: Self-pay | Admitting: Pharmacy Technician

## 2021-07-27 NOTE — Telephone Encounter (Signed)
Advanced Heart Failure Patient Advocate Encounter  Sent Adempas assistance application to [employer]. Patient sent her portion to the company.

## 2021-08-01 NOTE — Progress Notes (Deleted)
Patient Care Team: [name], PA-C as PCP - General (Physician Assistant) Josue Hector, MD as PCP - Cardiology (Cardiology) Dorisann Frames, MD as Consulting Physician (Radiation Oncology) Marice Potter, MD as Consulting Physician (Hematology and Oncology)  Clinic Day:  08/01/2021  Referring physician: [name], PA-C  ASSESSMENT & PLAN:   Assessment & Plan: No problem-specific Assessment & Plan notes found for this encounter.    The patient understands the plans discussed today and is in agreement with them.  She knows to contact our office if she develops concerns prior to her next appointment.     Joy Ped, NP  [facility] [address] [place] [address] Dept: [phone] Dept Fax: [phone]   No orders of the defined types were placed in this encounter.     CHIEF COMPLAINT:  CC: An 87 year old female with history of   Current Treatment:  Retacrit  INTERVAL HISTORY:  [name] is here today for repeat clinical assessment. She denies fevers or chills. She denies pain. Her appetite is good. Her weight {Weight change:10426}.  I have reviewed the past medical history, past surgical history, social history and family history with the patient and they are unchanged from previous note.  ALLERGIES:  is allergic to sulfa antibiotics.  MEDICATIONS:  Current Outpatient Medications  Medication Sig Dispense Refill   ALPRAZolam (XANAX) 0.5 MG tablet Take 0.5 mg by mouth daily as needed for anxiety.      aspirin EC 81 MG tablet Take 1 tablet (81 mg total) by mouth daily. 30 tablet 11   atorvastatin (LIPITOR) 80 MG tablet Take 1 tablet (80 mg total) by mouth daily. 90 tablet 3   carvedilol (COREG) 3.125 MG tablet TAKE 1 TABLET BY MOUTH TWICE DAILY WITH A MEAL 60 tablet 11   cyclobenzaprine (FLEXERIL) 10 MG tablet Take 10 mg by mouth in the morning.     Epoetin Alfa-epbx (RETACRIT  IJ) Inject 1 Dose as directed every 21 ( twenty-one) days.     FERROUS SULFATE PO Take 40 mg by mouth daily.     furosemide (LASIX) 40 MG tablet TAKE 1 TABLET BY MOUTH EVERY MORNING, AND 1/2 TABLET IN THE EVENING 45 tablet 11   gabapentin (NEURONTIN) 300 MG capsule Take 300 mg by mouth as needed.     lactose free nutrition (BOOST) LIQD Take 237 mLs by mouth 2 (two) times daily between meals.      mirtazapine (REMERON) 30 MG tablet Take 30 mg by mouth at bedtime.     OPSUMIT 10 MG tablet Take 1 tablet (10 mg total) by mouth daily. 90 tablet 5   Riociguat (ADEMPAS) 2.5 MG TABS Take 2.5 mg by mouth in the morning, at noon, and at bedtime. 180 tablet 3   traMADol-acetaminophen (ULTRACET) 37.5-325 MG tablet Take 2 tablets by mouth 4 (four) times daily as needed for pain.  2   Treprostinil Diolamine ER 0.125 MG TBCR Take 1 tablet by mouth in the morning, at noon, and at bedtime.     No current facility-administered medications for this visit.    HISTORY OF PRESENT ILLNESS:   Oncology History   No history exists.      REVIEW OF SYSTEMS:   Constitutional: Denies fevers, chills or abnormal weight loss Eyes: Denies blurriness of vision Ears, nose, mouth, throat, and face: Denies mucositis or sore throat Respiratory: Denies cough, dyspnea or wheezes Cardiovascular: Denies palpitation, chest discomfort or lower  extremity swelling Gastrointestinal:  Denies nausea, heartburn or change in bowel habits Skin: Denies abnormal skin rashes Lymphatics: Denies new lymphadenopathy or easy bruising Neurological:Denies numbness, tingling or new weaknesses Behavioral/Psych: Mood is stable, no new changes  All other systems were reviewed with the patient and are negative.   VITALS:  There were no vitals taken for this visit.  Wt Readings from Last 3 Encounters:  07/19/21 115 lb 9.6 oz (52.4 kg)  07/17/21 118 lb (53.5 kg)  06/26/21 117 lb 1.9 oz (53.1 kg)    There is no height or weight on file to  calculate BMI.  Performance status (ECOG): {CHL ONC [id number]  PHYSICAL EXAM:   GENERAL:alert, no distress and comfortable SKIN: skin color, texture, turgor are normal, no rashes or significant lesions EYES: normal, Conjunctiva are pink and non-injected, sclera clear OROPHARYNX:no exudate, no erythema and lips, buccal mucosa, and tongue normal  NECK: supple, thyroid normal size, non-tender, without nodularity LYMPH:  no palpable lymphadenopathy in the cervical, axillary or inguinal LUNGS: clear to auscultation and percussion with normal breathing effort HEART: regular rate & rhythm and no murmurs and no lower extremity edema ABDOMEN:abdomen soft, non-tender and normal bowel sounds Musculoskeletal:no cyanosis of digits and no clubbing  NEURO: alert & oriented x 3 with fluent speech, no focal motor/sensory deficits  LABORATORY DATA:  I have reviewed the data as listed    Component Value Date/Time   NA 134 (A) 07/17/2021 0000   K 5.0 07/17/2021 0000   CL 95 (A) 07/17/2021 0000   CO2 33 (A) 07/17/2021 0000   GLUCOSE 114 (H) 04/19/2021 1450   BUN 37 (A) 07/17/2021 0000   CREATININE 1.4 (A) 07/17/2021 0000   CREATININE 1.23 (H) 04/19/2021 1450   CALCIUM 9.0 07/17/2021 0000   PROT 7.4 11/18/2020 1221   ALBUMIN 3.6 07/17/2021 0000   AST 26 07/17/2021 0000   ALT 15 07/17/2021 0000   ALKPHOS 59 07/17/2021 0000   BILITOT 0.2 (L) 11/18/2020 1221   GFRNONAA 43 (L) 04/19/2021 1450   GFRAA 54 (L) 04/12/2020 1202    No results found for: SPEP, UPEP  Lab Results  Component Value Date   WBC 5.8 07/17/2021   NEUTROABS 3.13 07/17/2021   HGB 8.6 (A) 07/17/2021   HCT 27 (A) 07/17/2021   MCV 85 07/17/2021   PLT 251 07/17/2021      Chemistry      Component Value Date/Time   NA 134 (A) 07/17/2021 0000   K 5.0 07/17/2021 0000   CL 95 (A) 07/17/2021 0000   CO2 33 (A) 07/17/2021 0000   BUN 37 (A) 07/17/2021 0000   CREATININE 1.4 (A) 07/17/2021 0000   CREATININE 1.23 (H)  04/19/2021 1450   GLU 133 07/17/2021 0000      Component Value Date/Time   CALCIUM 9.0 07/17/2021 0000   ALKPHOS 59 07/17/2021 0000   AST 26 07/17/2021 0000   ALT 15 07/17/2021 0000   BILITOT 0.2 (L) 11/18/2020 1221       RADIOGRAPHIC STUDIES: I have personally reviewed the radiological images as listed and agreed with the findings in the report. No results found.

## 2021-08-02 ENCOUNTER — Other Ambulatory Visit: Payer: Medicare Other

## 2021-08-02 ENCOUNTER — Other Ambulatory Visit: Payer: Self-pay | Admitting: Pharmacist

## 2021-08-02 ENCOUNTER — Ambulatory Visit: Payer: Medicare Other | Admitting: Hematology and Oncology

## 2021-08-02 ENCOUNTER — Telehealth: Payer: Self-pay | Admitting: Hematology and Oncology

## 2021-08-02 NOTE — Telephone Encounter (Signed)
Per 12/29 Staff Msg, patient's 12/29 Labs, Follow Up rescheduled to 08/08/21 - Injection 08/09/2021.  Ok per patient/daughter

## 2021-08-03 ENCOUNTER — Other Ambulatory Visit (HOSPITAL_COMMUNITY): Payer: Self-pay | Admitting: Cardiology

## 2021-08-03 DIAGNOSIS — I272 Pulmonary hypertension, unspecified: Secondary | ICD-10-CM | POA: Diagnosis not present

## 2021-08-07 ENCOUNTER — Inpatient Hospital Stay: Payer: Medicare Other

## 2021-08-08 ENCOUNTER — Encounter: Payer: Self-pay | Admitting: Hematology and Oncology

## 2021-08-08 ENCOUNTER — Inpatient Hospital Stay: Payer: Medicare Other | Attending: Oncology

## 2021-08-08 ENCOUNTER — Other Ambulatory Visit: Payer: Self-pay | Admitting: Hematology and Oncology

## 2021-08-08 ENCOUNTER — Inpatient Hospital Stay (INDEPENDENT_AMBULATORY_CARE_PROVIDER_SITE_OTHER): Payer: Medicare Other | Admitting: Hematology and Oncology

## 2021-08-08 ENCOUNTER — Telehealth: Payer: Self-pay | Admitting: Hematology and Oncology

## 2021-08-08 ENCOUNTER — Other Ambulatory Visit: Payer: Self-pay

## 2021-08-08 DIAGNOSIS — D631 Anemia in chronic kidney disease: Secondary | ICD-10-CM | POA: Diagnosis not present

## 2021-08-08 DIAGNOSIS — N189 Chronic kidney disease, unspecified: Secondary | ICD-10-CM

## 2021-08-08 DIAGNOSIS — N183 Chronic kidney disease, stage 3 unspecified: Secondary | ICD-10-CM | POA: Insufficient documentation

## 2021-08-08 LAB — CBC AND DIFFERENTIAL
HCT: 30 — AB (ref 36–46)
Hemoglobin: 9.3 — AB (ref 12.0–16.0)
Neutrophils Absolute: 2.3
Platelets: 277 (ref 150–399)
WBC: 4.9

## 2021-08-08 LAB — CBC: RBC: 3.65 — AB (ref 3.87–5.11)

## 2021-08-08 NOTE — Assessment & Plan Note (Addendum)
88 year old female with anemia of uncertain etiology who we began seeing in October.  The only abnormality seen on evaluation was that she has mild kidney disease.  Her hemoglobin was substantially low at 7.8 and it was difficult to believe her mild kidney disease is the only culprit behind her anemia.  Nevertheless, she received Retacrit 20,000 units every 3 weeks for 3 doses.  Her hemoglobin has been slowly improving with Retacrit, so I will continue Retacrit every 4 weeks with a CBC.  We will plan to see her back in 12 weeks for repeat clinical assessment.

## 2021-08-08 NOTE — Progress Notes (Cosign Needed)
[facility]  [address] [place],  [place]  [address] [phone]  Clinic Day:  08/08/2021  Referring physician: [name], PA-C  ASSESSMENT & PLAN:   Assessment & Plan: Anemia in chronic kidney disease 88 year old female with anemia of uncertain etiology who we began seeing in October.  The only abnormality seen on evaluation was that she has mild kidney disease.  Her hemoglobin was substantially low at 7.8 and it was difficult to believe her mild kidney disease is the only culprit behind her anemia.  Nevertheless, she received Retacrit 20,000 units every 3 weeks for 3 doses.  Her hemoglobin has been slowly improving with Retacrit, so I will continue Retacrit every 4 weeks with a CBC.  We will plan to see her back in 12 weeks for repeat clinical assessment.   The patient understands the plans discussed today and is in agreement with them.  She knows to contact our office if she develops concerns prior to her next appointment.    Marvia Pickles, PA-C  [facility] [address] [place] [address] Dept: [phone] Dept Fax: [phone]   No orders of the defined types were placed in this encounter.     CHIEF COMPLAINT:  CC: Anemia  Current Treatment: Retacrit every 3 weeks   HISTORY OF PRESENT ILLNESS:  The patient is an 88 year old female with anemia of uncertain etiology.  In the past, it was related to iron deficiency.  Her labs today suggest a component of her anemia may be due to renal insufficiency.  Iron, B12 and folate levels did not reveal any nutritional deficiencies factoring into her anemia.  Serum protein electrophoresis did not reveal a plasma cell dyscrasia, such as multiple myeloma, as the cause of her anemia.  The only abnormality seen was that she has mild kidney disease.  Her hemoglobin was substantially low at 7.8; so we had difficulty believing her  mild kidney disease is the only culprit behind her anemia.  Nevertheless, we gave her Retacrit 20,000 units every 3 weeks for 3 doses.  Over that time, she has had increase in her hemoglobin which was up to 8.6 on December 13th.  INTERVAL HISTORY:  Mekala is here today for repeat clinical assessment.  She states she remains fatigued despite receiving Retacrit. She continues to report shortness of breath and remains on continuous oxygen via nasal canula. She denies any new complaints. She denies fevers or chills. She denies pain. Her appetite is good. Her weight has been stable.  REVIEW OF SYSTEMS:  Review of Systems  Constitutional:  Positive for fatigue. Negative for appetite change, chills, fever and unexpected weight change.  HENT:   Negative for lump/mass, mouth sores and sore throat.   Respiratory:  Positive for shortness of breath. Negative for cough.   Cardiovascular:  Negative for chest pain and leg swelling.  Gastrointestinal:  Negative for abdominal pain, constipation, diarrhea, nausea and vomiting.  Endocrine: Negative for hot flashes.  Genitourinary:  Negative for difficulty urinating, dysuria, frequency and hematuria.   Musculoskeletal:  Negative for arthralgias, back pain and myalgias.  Skin:  Negative for rash.  Neurological:  Negative for dizziness and headaches.  Hematological:  Negative for adenopathy. Does not bruise/bleed easily.  Psychiatric/Behavioral:  Negative for depression and sleep disturbance. The patient is not nervous/anxious.     VITALS:  Blood pressure (!) 145/64, pulse 89, temperature 98.7 F (37.1 C), temperature source Oral, resp. rate  18, height _0  (1.575 m), weight 117 lb (53.1 kg), SpO2 97 %.  Wt Readings from Last 3 Encounters:  08/08/21 117 lb (53.1 kg)  07/19/21 115 lb 9.6 oz (52.4 kg)  07/17/21 118 lb (53.5 kg)    Body mass index is 21.4 kg/m.  Performance status (ECOG): 2 - Symptomatic, <50% confined to bed  PHYSICAL EXAM:  Physical  Exam Vitals and nursing note reviewed.  Constitutional:      General: She is not in acute distress.    Appearance: Normal appearance.  HENT:     Head: Normocephalic and atraumatic.     Mouth/Throat:     Mouth: Mucous membranes are moist.     Pharynx: Oropharynx is clear. No oropharyngeal exudate or posterior oropharyngeal erythema.  Eyes:     General: No scleral icterus.    Extraocular Movements: Extraocular movements intact.     Conjunctiva/sclera: Conjunctivae normal.     Pupils: Pupils are equal, round, and reactive to light.  Cardiovascular:     Rate and Rhythm: Normal rate and regular rhythm.     Heart sounds: Normal heart sounds. No murmur heard.   No friction rub. No gallop.  Pulmonary:     Effort: Pulmonary effort is normal.     Breath sounds: Normal breath sounds. No wheezing, rhonchi or rales.  Abdominal:     General: There is no distension.     Palpations: Abdomen is soft. There is no hepatomegaly, splenomegaly or mass.     Tenderness: There is no abdominal tenderness.  Musculoskeletal:        General: Normal range of motion.     Cervical back: Normal range of motion and neck supple. No tenderness.     Right lower leg: No edema.     Left lower leg: No edema.  Lymphadenopathy:     Cervical: No cervical adenopathy.     Upper Body:     Right upper body: No supraclavicular or axillary adenopathy.     Left upper body: No supraclavicular or axillary adenopathy.     Lower Body: No right inguinal adenopathy. No left inguinal adenopathy.  Skin:    General: Skin is warm and dry.     Coloration: Skin is not jaundiced.     Findings: No rash.  Neurological:     Mental Status: She is alert and oriented to person, place, and time.     Cranial Nerves: No cranial nerve deficit.  Psychiatric:        Mood and Affect: Mood normal.        Behavior: Behavior normal.        Thought Content: Thought content normal.   LABS:   CBC Latest Ref Rng & Units 08/08/2021 07/17/2021  06/26/2021  WBC - 4.9 5.8 6.8  Hemoglobin 12.0 - 16.0 9.3(A) 8.6(A) 8.2(A)  Hematocrit 36 - 46 30(A) 27(A) 26(A)  Platelets 150 - 399 277 251 282   CMP Latest Ref Rng & Units 07/17/2021 05/22/2021 04/19/2021  Glucose 70 - 99 mg/dL - - 114(H)  BUN 4 - 21 37(A) 34(A) 19  Creatinine 0.5 - 1.1 1.4(A) 1.4(A) 1.23(H)  Sodium 137 - 147 134(A) 139 137  Potassium 3.4 - 5.3 5.0 4.6 4.3  Chloride 99 - 108 95(A) 98(A) 100  CO2 13 - 22 33(A) 35(A) 31  Calcium 8.7 - 10.7 9.0 9.1 9.5  Total Protein 6.5 - 8.1 g/dL - - -  Total Bilirubin 0.3 - 1.2 mg/dL - - -  Alkaline Phos  25 - 125 59 61 -  AST 13 - 35 26 26 -  ALT 7 - 35 15 12 -     No results found for: CEA1 / No results found for: CEA1 No results found for: PSA1 No results found for: CAN199 No results found for: CAN125  Lab Results  Component Value Date   TOTALPROTELP 6.1 05/22/2021   ALBUMINELP 2.9 05/22/2021   A1GS 0.3 05/22/2021   A2GS 0.9 05/22/2021   BETS 1.0 05/22/2021   GAMS 1.0 05/22/2021   MSPIKE Not Observed 05/22/2021   SPEI Comment 05/22/2021   Lab Results  Component Value Date   TIBC 319 05/22/2021   TIBC 406 12/29/2020   FERRITIN 31 05/22/2021   FERRITIN 17 12/29/2020   IRONPCTSAT 12 05/22/2021   IRONPCTSAT 6 (L) 12/29/2020   Lab Results  Component Value Date   LDH 121 05/22/2021    STUDIES:  No results found.    HISTORY:   Past Medical History:  Diagnosis Date   Anemia    Anxiety    Arthritis    osteo; "mostly hands, knees, probably back too" (12/09/2016)   Chronic lower back pain    Complication of anesthesia 2011   resp distress -on vent after surgery   Coronary artery disease    Depression    Diabetes ([facility])    Esophageal stricture    GERD (gastroesophageal reflux disease)    Headache    out grew them   High cholesterol    History of blood transfusion 2011; ?03/2012   "related to ORs" (12/09/2016)   Hypertension    Intestinal obstruction ([facility])    Macular degeneration of left eye    dx over  55 yrs ago.....hasn't changed much   Shingles    Squamous carcinoma 2013   squamas cell on scalp--took 14 radiation tx--2013   Stroke [facility]) 1967   Mini stroke;  No lasting deficits   Type II diabetes mellitus ([facility]) dx'd 11/2016    Past Surgical History:  Procedure Laterality Date   ABDOMINAL HYSTERECTOMY     APPENDECTOMY     BACK SURGERY     BALLOON DILATION N/A 06/24/2020   Procedure: BALLOON DILATION;  Surgeon: Clarene Essex, MD;  Location: [facility];  Service: Endoscopy;  Laterality: N/A;   CATARACT EXTRACTION, BILATERAL Bilateral    CHOLECYSTECTOMY     COLON SURGERY     COLONOSCOPY     CORONARY ATHERECTOMY N/A 05/15/2018   Procedure: CORONARY ATHERECTOMY;  Surgeon: Leonie Man, MD;  Location: [facility];  Service: Cardiovascular;  Laterality: N/A;   CORONARY STENT INTERVENTION N/A 05/15/2018   Procedure: CORONARY STENT INTERVENTION;  Surgeon: Leonie Man, MD;  Location: [facility];  Service: Cardiovascular;  Laterality: N/A;   ESOPHAGOGASTRODUODENOSCOPY N/A 06/24/2020   Procedure: ESOPHAGOGASTRODUODENOSCOPY (EGD);  Surgeon: Clarene Essex, MD;  Location: [facility];  Service: Endoscopy;  Laterality: N/A;   ESOPHAGOGASTRODUODENOSCOPY (EGD) WITH ESOPHAGEAL DILATION  "several times"   ESOPHAGOGASTRODUODENOSCOPY (EGD) WITH PROPOFOL N/A 12/31/2016   Procedure: ESOPHAGOGASTRODUODENOSCOPY (EGD) WITH PROPOFOL;  Surgeon: Wilford Corner, MD;  Location: [facility];  Service: Endoscopy;  Laterality: N/A;   EYE SURGERY     FRACTURE SURGERY     IR FLUORO GUIDE CV LINE RIGHT  01/02/2017   IR US GUIDE VASC ACCESS RIGHT  01/02/2017   JOINT REPLACEMENT     LEFT HEART CATH AND CORONARY ANGIOGRAPHY N/A 10/09/2018   Procedure: LEFT HEART CATH AND CORONARY ANGIOGRAPHY;  Surgeon: Loralie Champagne  S, MD;  Location: [facility];  Service: Cardiovascular;  Laterality: N/A;   LUMBAR DISC SURGERY  10/25/2014   Right L4-L5 removal of seven free fragments of disk mostly  posterolaterally.  Lysis of adhesion.  Microscope/notes 10/26/2014   LUMBAR LAMINECTOMY/DECOMPRESSION MICRODISCECTOMY Right 08/09/2014   Procedure: Right Lumbar Four to Five Microdiskectomy;  Surgeon: Floyce Stakes, MD;  Location: [facility];  Service: Neurosurgery;  Laterality: Right;  Right L4-5 Microdiskectomy   ORIF PERIPROSTHETIC FRACTURE  03/31/2012   Procedure: OPEN REDUCTION INTERNAL FIXATION (ORIF) PERIPROSTHETIC FRACTURE;  Surgeon: Mauri Pole, MD;  Location: [facility];  Service: Orthopedics;  Laterality: Left;  Open reduction internal fixation Left distal femur periprosthetic fracture   RIGHT/LEFT HEART CATH AND CORONARY ANGIOGRAPHY N/A 05/14/2018   Procedure: RIGHT/LEFT HEART CATH AND CORONARY ANGIOGRAPHY;  Surgeon: Larey Dresser, MD;  Location: [facility];  Service: Cardiovascular;  Laterality: N/A;   SMALL INTESTINE SURGERY  2011   "really bad blockage; went into respiratory distress and was on ventilator after surgery; [facility]"   SQUAMOUS CELL CARCINOMA EXCISION  ~ 2012   S/P "cut off her head then 15 radiation txs"    TOTAL KNEE ARTHROPLASTY Bilateral    bilateral    History reviewed. No pertinent family history.  Social History:  reports that she quit smoking about 30 years ago. Her smoking use included cigarettes. She has a 1.00 pack-year smoking history. She has never used smokeless tobacco. She reports that she does not drink alcohol and does not use drugs.The patient is accompanied by her daughter today.  Allergies:  Allergies  Allergen Reactions   Sulfa Antibiotics Swelling    Mouth and tongue swelling    Current Medications: Current Outpatient Medications  Medication Sig Dispense Refill   ALPRAZolam (XANAX) 0.5 MG tablet Take 0.5 mg by mouth daily as needed for anxiety.      aspirin EC 81 MG tablet Take 1 tablet (81 mg total) by mouth daily. 30 tablet 11   atorvastatin (LIPITOR) 80 MG tablet Take 1 tablet (80 mg total) by mouth daily. 90  tablet 3   carvedilol (COREG) 3.125 MG tablet TAKE 1 TABLET BY MOUTH TWICE DAILY WITH A MEAL 60 tablet 11   cyclobenzaprine (FLEXERIL) 10 MG tablet Take 10 mg by mouth in the morning.     cycloSPORINE (RESTASIS) 0.05 % ophthalmic emulsion SMARTSIG:In Eye(s)     Epoetin Alfa-epbx (RETACRIT IJ) Inject 1 Dose as directed every 21 ( twenty-one) days.     FEROSUL 325 (65 Fe) MG tablet Take 325 mg by mouth daily.     furosemide (LASIX) 40 MG tablet TAKE 1 TABLET BY MOUTH EVERY MORNING, AND 1/2 TABLET IN THE EVENING 45 tablet 11   gabapentin (NEURONTIN) 300 MG capsule Take 300 mg by mouth as needed.     lactose free nutrition (BOOST) LIQD Take 237 mLs by mouth 2 (two) times daily between meals.      mirtazapine (REMERON) 30 MG tablet Take 30 mg by mouth at bedtime.     OPSUMIT 10 MG tablet Take 1 tablet (10 mg total) by mouth daily. 90 tablet 5   Riociguat (ADEMPAS) 2.5 MG TABS Take 2.5 mg by mouth in the morning, at noon, and at bedtime. 180 tablet 3   traMADol-acetaminophen (ULTRACET) 37.5-325 MG tablet Take 2 tablets by mouth 4 (four) times daily as needed for pain.  2   Treprostinil Diolamine ER 0.125 MG TBCR Take 1 tablet by mouth  in the morning, at noon, and at bedtime.     No current facility-administered medications for this visit.

## 2021-08-08 NOTE — Telephone Encounter (Signed)
Per 1/4 los next appt scheduled and given to patient

## 2021-08-09 ENCOUNTER — Inpatient Hospital Stay: Payer: Medicare Other

## 2021-08-09 VITALS — BP 121/56 | HR 84 | Temp 98.2°F | Resp 18 | Ht 62.0 in | Wt 117.1 lb

## 2021-08-09 DIAGNOSIS — N183 Chronic kidney disease, stage 3 unspecified: Secondary | ICD-10-CM | POA: Diagnosis not present

## 2021-08-09 DIAGNOSIS — N189 Chronic kidney disease, unspecified: Secondary | ICD-10-CM

## 2021-08-09 DIAGNOSIS — D631 Anemia in chronic kidney disease: Secondary | ICD-10-CM | POA: Diagnosis present

## 2021-08-09 MED ORDER — EPOETIN ALFA-EPBX 10000 UNIT/ML IJ SOLN
10000.0000 [IU] | Freq: Once | INTRAMUSCULAR | Status: AC
  Administered 2021-08-09: 10000 [IU] via SUBCUTANEOUS
  Filled 2021-08-09: qty 1

## 2021-08-09 NOTE — Patient Instructions (Signed)
Epoetin Alfa injection What is this medication? EPOETIN ALFA (e POE e tin AL fa) helps your body make more red blood cells. This medicine is used to treat anemia caused by chronic kidney disease, cancer chemotherapy, or HIV-therapy. It may also be used before surgery if you have anemia. This medicine may be used for other purposes; ask your health care provider or pharmacist if you have questions. COMMON BRAND NAME(S): Epogen, Procrit, Retacrit What should I tell my care team before I take this medication? They need to know if you have any of these conditions: cancer heart disease high blood pressure history of blood clots history of stroke low levels of folate, iron, or vitamin B12 in the blood seizures an unusual or allergic reaction to erythropoietin, albumin, benzyl alcohol, hamster proteins, other medicines, foods, dyes, or preservatives pregnant or trying to get pregnant breast-feeding How should I use this medication? This medicine is for injection into a vein or under the skin. It is usually given by a health care professional in a hospital or clinic setting. If you get this medicine at home, you will be taught how to prepare and give this medicine. Use exactly as directed. Take your medicine at regular intervals. Do not take your medicine more often than directed. It is important that you put your used needles and syringes in a special sharps container. Do not put them in a trash can. If you do not have a sharps container, call your pharmacist or healthcare provider to get one. A special MedGuide will be given to you by the pharmacist with each prescription and refill. Be sure to read this information carefully each time. Talk to your pediatrician regarding the use of this medicine in children. While this drug may be prescribed for selected conditions, precautions do apply. Overdosage: If you think you have taken too much of this medicine contact a poison control center or emergency  room at once. NOTE: This medicine is only for you. Do not share this medicine with others. What if I miss a dose? If you miss a dose, take it as soon as you can. If it is almost time for your next dose, take only that dose. Do not take double or extra doses. What may interact with this medication? Interactions have not been studied. This list may not describe all possible interactions. Give your health care provider a list of all the medicines, herbs, non-prescription drugs, or dietary supplements you use. Also tell them if you smoke, drink alcohol, or use illegal drugs. Some items may interact with your medicine. What should I watch for while using this medication? Your condition will be monitored carefully while you are receiving this medicine. You may need blood work done while you are taking this medicine. This medicine may cause a decrease in vitamin B6. You should make sure that you get enough vitamin B6 while you are taking this medicine. Discuss the foods you eat and the vitamins you take with your health care professional. What side effects may I notice from receiving this medication? Side effects that you should report to your doctor or health care professional as soon as possible: allergic reactions like skin rash, itching or hives, swelling of the face, lips, or tongue seizures signs and symptoms of a blood clot such as breathing problems; changes in vision; chest pain; severe, sudden headache; pain, swelling, warmth in the leg; trouble speaking; sudden numbness or weakness of the face, arm or leg signs and symptoms of a stroke like  changes in vision; confusion; trouble speaking or understanding; severe headaches; sudden numbness or weakness of the face, arm or leg; trouble walking; dizziness; loss of balance or coordination Side effects that usually do not require medical attention (report to your doctor or health care professional if they continue or are  bothersome): chills cough dizziness fever headaches joint pain muscle cramps muscle pain nausea, vomiting pain, redness, or irritation at site where injected This list may not describe all possible side effects. Call your doctor for medical advice about side effects. You may report side effects to FDA at 1-800-FDA-1088. Where should I keep my medication? Keep out of the reach of children. Store in a refrigerator between 2 and 8 degrees C (36 and 46 degrees F). Do not freeze or shake. Throw away any unused portion if using a single-dose vial. Multi-dose vials can be kept in the refrigerator for up to 21 days after the initial dose. Throw away unused medicine. NOTE: This sheet is a summary. It may not cover all possible information. If you have questions about this medicine, talk to your doctor, pharmacist, or health care provider.  2022 Elsevier/Gold Standard (2017-03-25 00:00:00)

## 2021-08-21 ENCOUNTER — Other Ambulatory Visit (HOSPITAL_COMMUNITY): Payer: Self-pay | Admitting: Cardiology

## 2021-08-21 ENCOUNTER — Telehealth (HOSPITAL_COMMUNITY): Payer: Self-pay

## 2021-08-21 ENCOUNTER — Telehealth: Payer: Self-pay | Admitting: Physician Assistant

## 2021-08-21 NOTE — Telephone Encounter (Signed)
Daughter called after hours to get instruction on oxygen use.  Patient use 4 L oxygen 24/7.  However recently fluctuating oxygen saturation between 82 to 92%.  Daughter trended up oxygen to 4.5 L and now oxygen staying at 92%.  No reported chest pain, lower extremity edema, orthopnea, PND or melena.  No weight gain, actually she is losing weight.  No sick contact.  No fever or chills she is getting treatment for her anemia.  I have recommended to continue to use 4.5 L of oxygen.  If recurrent hypoxia she needs to be evaluated either in clinic or in ER.  Daughter is agree with plan and continue to monitor her mother.

## 2021-08-21 NOTE — Telephone Encounter (Signed)
Patients daughter reports that the patient has been having low oxygen readings since Saturday (08/18/21) despite being on 4L of oxygen. Her Lowest 79 and Highest was 92. She denies all other symptoms such as swelling,coughing,dizziness,shortness of breath.   CB# [phone] Joy

## 2021-08-21 NOTE — Telephone Encounter (Signed)
Will need CXR and office evaluation.

## 2021-08-22 NOTE — Telephone Encounter (Signed)
Spoke with daughter appt. Scheduled.

## 2021-08-24 ENCOUNTER — Ambulatory Visit (HOSPITAL_COMMUNITY)
Admission: RE | Admit: 2021-08-24 | Discharge: 2021-08-24 | Disposition: A | Discharge status: Home / Self Care | Payer: Medicare Other | Source: Ambulatory Visit | Attending: Cardiology | Admitting: Cardiology

## 2021-08-24 ENCOUNTER — Other Ambulatory Visit: Payer: Self-pay

## 2021-08-24 ENCOUNTER — Encounter (HOSPITAL_COMMUNITY): Payer: Self-pay | Admitting: Cardiology

## 2021-08-24 VITALS — BP 120/50 | HR 85 | Wt 120.2 lb

## 2021-08-24 DIAGNOSIS — Z87891 Personal history of nicotine dependence: Secondary | ICD-10-CM | POA: Diagnosis not present

## 2021-08-24 DIAGNOSIS — I200 Unstable angina: Secondary | ICD-10-CM

## 2021-08-24 DIAGNOSIS — I110 Hypertensive heart disease with heart failure: Secondary | ICD-10-CM | POA: Diagnosis not present

## 2021-08-24 DIAGNOSIS — J849 Interstitial pulmonary disease, unspecified: Secondary | ICD-10-CM | POA: Insufficient documentation

## 2021-08-24 DIAGNOSIS — E785 Hyperlipidemia, unspecified: Secondary | ICD-10-CM | POA: Insufficient documentation

## 2021-08-24 DIAGNOSIS — I5032 Chronic diastolic (congestive) heart failure: Secondary | ICD-10-CM | POA: Insufficient documentation

## 2021-08-24 DIAGNOSIS — Z79899 Other long term (current) drug therapy: Secondary | ICD-10-CM | POA: Insufficient documentation

## 2021-08-24 DIAGNOSIS — Z955 Presence of coronary angioplasty implant and graft: Secondary | ICD-10-CM | POA: Diagnosis not present

## 2021-08-24 DIAGNOSIS — R0609 Other forms of dyspnea: Secondary | ICD-10-CM

## 2021-08-24 DIAGNOSIS — J439 Emphysema, unspecified: Secondary | ICD-10-CM | POA: Insufficient documentation

## 2021-08-24 DIAGNOSIS — I25119 Atherosclerotic heart disease of native coronary artery with unspecified angina pectoris: Secondary | ICD-10-CM

## 2021-08-24 DIAGNOSIS — Z9981 Dependence on supplemental oxygen: Secondary | ICD-10-CM | POA: Insufficient documentation

## 2021-08-24 DIAGNOSIS — I2510 Atherosclerotic heart disease of native coronary artery without angina pectoris: Secondary | ICD-10-CM | POA: Insufficient documentation

## 2021-08-24 DIAGNOSIS — I272 Pulmonary hypertension, unspecified: Secondary | ICD-10-CM

## 2021-08-24 DIAGNOSIS — Z7982 Long term (current) use of aspirin: Secondary | ICD-10-CM | POA: Diagnosis not present

## 2021-08-24 DIAGNOSIS — E1169 Type 2 diabetes mellitus with other specified complication: Secondary | ICD-10-CM | POA: Insufficient documentation

## 2021-08-24 LAB — BASIC METABOLIC PANEL
Anion gap: 8 (ref 5–15)
BUN: 28 mg/dL — ABNORMAL HIGH (ref 8–23)
CO2: 31 mmol/L (ref 22–32)
Calcium: 9 mg/dL (ref 8.9–10.3)
Chloride: 98 mmol/L (ref 98–111)
Creatinine, Ser: 0.99 mg/dL (ref 0.44–1.00)
GFR, Estimated: 55 mL/min — ABNORMAL LOW (ref >60)
Glucose, Bld: 155 mg/dL — ABNORMAL HIGH (ref 70–99)
Potassium: 4 mmol/L (ref 3.5–5.1)
Sodium: 137 mmol/L (ref 135–145)

## 2021-08-24 LAB — LIPID PANEL
Cholesterol: 93 mg/dL (ref 0–200)
HDL: 30 mg/dL — ABNORMAL LOW (ref >40)
LDL Cholesterol: 36 mg/dL (ref 0–99)
Total CHOL/HDL Ratio: 3.1 RATIO
Triglycerides: 135 mg/dL (ref <150)
VLDL: 27 mg/dL (ref 0–40)

## 2021-08-24 LAB — BRAIN NATRIURETIC PEPTIDE: B Natriuretic Peptide: 280.8 pg/mL — ABNORMAL HIGH (ref 0.0–100.0)

## 2021-08-24 MED ORDER — FUROSEMIDE 40 MG PO TABS: 40.0000 mg | ORAL_TABLET | Freq: Two times a day (BID) | ORAL | 2 refills | Status: AC

## 2021-08-24 NOTE — Patient Instructions (Addendum)
Increase Furosemide to 60 mg (1 & 1/2 tab) in AM and 40 mg (1 tab) in PM FOR 2 DAYS ONLY, then take 40 mg Twice daily   Labs done today, your results will be available in MyChart, we will contact you for abnormal readings.  Chest X-ray needed today  You have been referred to Dr Silas Flood at [facility], they will call you for an appointment  Chest CT, once approved by your insurance we will call you to schedule  Your physician recommends that you schedule a follow-up appointment in: 1 month  If you have any questions or concerns before your next appointment please send [place] a message through [place] or call our office at [phone].    TO LEAVE A MESSAGE FOR THE NURSE SELECT OPTION 2, PLEASE LEAVE A MESSAGE INCLUDING: YOUR NAME DATE OF BIRTH CALL BACK NUMBER REASON FOR CALL**this is important as we prioritize the call backs  YOU WILL RECEIVE A CALL BACK THE SAME DAY AS LONG AS YOU CALL BEFORE 4:00 PM  At the [facility], you and your health needs are our priority. As part of our continuing mission to provide you with exceptional heart care, we have created designated Provider Care Teams. These Care Teams include your primary Cardiologist (physician) and Advanced Practice Providers (APPs- Physician Assistants and Nurse Practitioners) who all work together to provide you with the care you need, when you need it.   You may see any of the following providers on your designated Care Team at your next follow up: Dr Glori Bickers Dr Haynes Kerns, NP Lyda Jester, [place] [facility] [place], [place] Audry Riles, PharmD   Please be sure to bring in all your medications bottles to every appointment.

## 2021-08-26 NOTE — Progress Notes (Signed)
ID:  [name], DOB 12-Dec-1931, MRN [id number]   Provider location: [place] [facility] Type of Visit: Established patient   PCP:  [name], PA-C  Cardiologist:  Jenkins Rouge, MD HF Cardiology: Dr. Aundra Dubin   History of Present Illness: [name] is an 88 y.o. with history of DM, HTN, and prior osteomyelitis of jaw referred by Dr. Johnsie Cancel for evaluation of pulmonary hypertension/RV failure.  She reports an episode of PNA in 3/19 and says that she never recovered from this.  She had gradually progressive exertional dyspnea to the point where she was short of breath walking around the house.  She had an echo in 7/19 that showed mildly decreased LV systolic function but severely decreased RV systolic function with dilated RV and at least moderate pulmonary hypertension by echo estimation.  She saw her PCP in 8/19 and was noted to be hypoxemic at rest.  He started her on home oxygen 2 L/[place] which she continues.    She was referred to cardiology, saw Dr. Johnsie Cancel. Amlodipine was stopped and she was started empirically on sildenafil 20 mg tid for pulmonary hypertension.   She was referred to CHF clinic.    RHC/LHC was done in 10/19, showing complex severe bifurcation lesion involving the proximal LAD and large D2 ostium. She had complex PCI requiring atherectomy and DES to pLAD and ostial D2.  RHC showed PAH with PVR 11.6 WU and CI 1.7.  She subsequently was found to have a right groin pseudoaneurysm from cath.   She was started on Opsumit.  Serologies have suggested CREST syndrome.  However, she saw Dr. Amil Amen for rheumatology who told her that he did not think that she had a rheumatologic diagnosis (per her report).   Cardiolite was abnormal in 3/20, this was followed by [facility] in 3/20 that showed nonobstructive CAD.  Echo in 3/20 showed EF 55-60%, with moderately decreased RV systolic function, PASP 64 mmHg.   Echo (3/21) showed EF 55-60%, moderately dilated RV with  moderately decreased systolic function, PASP 38 mmHg. Echo in 5/22 showed EF 55-60%, basal inferior akinesis, mildly dilated RV with moderately decreased systolic function, D-shaped septum.   She returns today for followup of pulmonary hypertension and CAD.  She continues to wear home oxygen.  Weight is up about 5 lbs.  Last week, her oxygen saturation was lower and she increased her oxygen to 4.5 L by [place].  Anemia is improved on erythropoeitin. No cough or fever.  She is short of breath walking long distances and with moderate exertion.  No orthopnea/PND.  Not short of breath walking around her house.    6 minute walk (12/19): 79 m 6 minute walk (1/20): 121 m 6 minute walk (2/21): 91 m 6 minute walk (9/21): 152 m 6 minute walk (2/22): 152 m 6 minute walk (5/22): 259 m  Labs (12/19): K 3.9, creatinine 0.78 Labs (3/20): K 4, creatinine 1.1, hgb 10.2 Labs (12/20): LDL 67, TGs 28, K 4.4, creatinine 1.04 Labs (2/21): BNP 181, K 4, creatinine 0.94 Labs (5/21): K 4.2, creatinine 0.94, BNP 202 Labs (8/21): hgb 10.4 Labs (9/21): LDL 38, TGs 289 Labs (11/21): K 3.9, creatinine 0.84 Labs (12/21): K 4.9, creatinine 0.98, LDL 48, TGs 209, BNP 197 Labs (4/22): BNP 218, K 3.2, creatinine 0.98, hgb 8.3 Labs (6/22): K 4.8, creatinine 1.13 Labs (9/22): hgb 8.5 Labs (12/22): hgb 8.3, TSH normal, K 5, creatinine 1.4 Labs (1/23): hgb 9.3  PMH: 1. H/o osteomyelitis  of jaw.  2. Duodenal ulcers with upper GI bleeding. 3. Type 2 diabetes 4. Hyperlipidemia 5. Squamous cell carcinoma of scalp with radiation and surgery.  6. GERD 7. H/o back surgery  8. HTN 9. Pulmonary hypertension/RV failure: Echo (7/19) with EF 45-50%, moderate focal basal septal hypertrophy, mild diffuse hypokinesis, septal flattening, severe RV dilation with severely decreased systolic function, moderate TR, PASP 61 mmHg.   - CT chest (7/19): Mild emphysema, enlarged PA.  - V/Q scan (8/19): No evidence for chronic PE.  -  anti-centromere Ab positive, ANA positive, RF 73 => suspect CREST syndrome.  - RHC (10/19): mean RA 7, PA 64/21 mean 38, mean PCWP 8, CI 1.7, PVR 11.6 WU.  - Echo (3/20): EF 55-60%, moderately decreased RV systolic function, PASP 64 mmHg - Echo (3/21): EF 55-60%, moderately dilated RV with moderately decreased systolic function, PASP 38 mmHg. - Echo (5/22): EF 55-60%, basal inferior akinesis, mildly dilated RV with moderately decreased systolic function, D-shaped septum. 10. CAD: LHC (10/19) with 95% proximal LAD and 95% ostial D2.  Complicated bifurcation intervention with atherectomy and DES to proximal LAD and ostial D2.   - LHC (3/20): 40% mLAD, 30% mRCA.  11. Post-cath right groin pseudoaneurysm in 10/19.  12. Anemia: Fe deficiency.  FOBT negative.   SH: Lives in [place] alone, daughter lives very close.  Quit smoking > 20 years ago. No ETOH.   FH: No history of pulmonary hypertension.  Mother with "heart problems."  Sister with PPM.    Review of systems complete and found to be negative unless listed in HPI.   Current Outpatient Medications  Medication Sig Dispense Refill   ALPRAZolam (XANAX) 0.5 MG tablet Take 0.5 mg by mouth daily as needed for anxiety.      aspirin EC 81 MG tablet Take 1 tablet (81 mg total) by mouth daily. 30 tablet 11   atorvastatin (LIPITOR) 80 MG tablet Take 1 tablet (80 mg total) by mouth daily. 90 tablet 3   carvedilol (COREG) 3.125 MG tablet TAKE 1 TABLET BY MOUTH TWICE DAILY WITH A MEAL 60 tablet 11   cyclobenzaprine (FLEXERIL) 10 MG tablet Take 10 mg by mouth in the morning.     cycloSPORINE (RESTASIS) 0.05 % ophthalmic emulsion SMARTSIG:In Eye(s)     Epoetin Alfa-epbx (RETACRIT IJ) Inject 1 Dose as directed every 21 ( twenty-one) days.     FEROSUL 325 (65 Fe) MG tablet Take 325 mg by mouth daily.     gabapentin (NEURONTIN) 300 MG capsule Take 300 mg by mouth as needed.     lactose free nutrition (BOOST) LIQD Take 237 mLs by mouth 2 (two) times daily  between meals.      mirtazapine (REMERON) 30 MG tablet Take 30 mg by mouth at bedtime.     OPSUMIT 10 MG tablet Take 1 tablet (10 mg total) by mouth daily. 90 tablet 5   Riociguat (ADEMPAS) 2.5 MG TABS Take 2.5 mg by mouth in the morning, at noon, and at bedtime. 180 tablet 3   traMADol-acetaminophen (ULTRACET) 37.5-325 MG tablet Take 2 tablets by mouth 4 (four) times daily as needed for pain.  2   Treprostinil Diolamine ER 0.125 MG TBCR Take 1 tablet by mouth in the morning, at noon, and at bedtime.     furosemide (LASIX) 40 MG tablet Take 1 tablet (40 mg total) by mouth 2 (two) times daily. 180 tablet 2   No current facility-administered medications for this encounter.   BP (!) 120/50  Pulse 85    Wt 54.5 kg (120 lb 3.2 oz)    SpO2 100% Comment: 4l N/C   BMI 21.98 kg/m   Wt Readings from Last 3 Encounters:  08/24/21 54.5 kg (120 lb 3.2 oz)  08/09/21 53.1 kg (117 lb 1.9 oz)  08/08/21 53.1 kg (117 lb)   BP (!) 120/50    Pulse 85    Wt 54.5 kg (120 lb 3.2 oz)    SpO2 100% Comment: 4l N/C   BMI 21.98 kg/m  General: NAD Neck: JVP 8 cm with HJR, no thyromegaly or thyroid nodule.  Lungs: Dry crackles bilateral bases.  CV: Nondisplaced PMI.  Heart regular S1/S2, no S3/S4, no murmur.  Trace ankle edema.  No carotid bruit.  Normal pedal pulses.  Abdomen: Soft, nontender, no hepatosplenomegaly, no distention.  Skin: Intact without lesions or rashes.  Neurologic: Alert and oriented x 3.  Psych: Normal affect. Extremities: No clubbing or cyanosis.  HEENT: Normal.   Assessment/Plan: 1. CAD: Coronary angiography showed complex bifurcation lesion with 95% stenosis proximal LAD with calcification at take-off of large D2. The ostial/proximal D2 also had 95% calcified stenosis. Suspect this lesion played a role in her exertional dyspnea and also in her mildly decreased LV systolic function (EF 98-26% on echo in 7/19).  S/p successful atherectomy with bifurcation stenting of proximal LAD and D2  05/15/18.  Repeat LHC in 3/20 showed nonobstructive disease.  No chest pain.  - Continue ASA 81.  - Plavix stopped with anemia.   - Continue atorvastatin, check lipids today.  - She is no longer on Vascepa, will assess triglycerides on today's lipid panel.  2. Pulmonary hypertension: She appeared to have at least moderate pulmonary hypertension by echo in 7/19 with RV failure. CT chest in 2019 showed mild emphysema, which should not explain her degree of RV failure.  V/Q scan did not show evidence for chronic PEs. Anti-centromere antibody, ANA, and RF were all positive.  ?CREST variant of scleroderma.  She is on home oxygen.  [place] 05/15/18 showed moderate PAH but very high PVR and low cardiac output.  This was concerning for advanced pulmonary hypertension.  Repeat echo in 3/21 showed moderately dilated/dysfunctional RV but PASP estimate was lower.  Echo in 5/22 showed mildly dilated/moderately dysfunctional RV, TR doppler jet not complete so probably not accurate PA pressure estimate.  She did not tolerate Uptravi.  Symptomatically, NYHA class II-III, stable.  Defer 6 minute walk today with volume overload.   - Continue riociguat, now on goal dose.  - Continue Opsumit 10 mg daily.  - Continue orenitram, she is unable to titrate any higher due to GI side effects.   - Refused sleep study.  - Continue home oxygen.  - BMET/BNP today.   - I will repeat high resolution CT chest w/o contrast given some worsening of hypoxemia, ?scleroderma-related ILD.  She will get a CXR today with cough.  - With gradually worsening hypoxemia, she needs a pulmonologist. I will refer to Dr. Silas Flood.  3. Chronic diastolic CHF/RV failure:  NYHA II-III.  She is mildly volume overloaded on exam today.   - Increase Lasix to 60 qam/40 qpm x 2 days then 40 mg bid after that. BMET today and again in 10 days.     4. ?CREST syndrome: Patient saw [facility]. Per her report, they did not think that she had CREST syndrome.     5. Anemia: Fe deficiency.  No overt GI bleeding, per her report FOBT was  negative.  She is being followed by hematology and is on erythropoeitin.  It is possible that the anemia comes from Opsumit or Adempas.  However, she has improved symptomatically with these medications.  Would like to continue unless hematologist feels strongly about trying her off of one of them.  Recently, hemoglobin has been improving => 9.3 most recently.  - Has appt with hematology again soon.   Followup with APP in 1 month.   Loralie Champagne 08/26/2021

## 2021-08-30 ENCOUNTER — Encounter: Payer: Self-pay | Admitting: Oncology

## 2021-09-02 ENCOUNTER — Other Ambulatory Visit (HOSPITAL_COMMUNITY): Payer: Self-pay | Admitting: Cardiology

## 2021-09-04 ENCOUNTER — Institutional Professional Consult (permissible substitution): Payer: Medicare Other | Admitting: Internal Medicine

## 2021-09-05 ENCOUNTER — Other Ambulatory Visit: Payer: Self-pay | Admitting: Hematology and Oncology

## 2021-09-05 DIAGNOSIS — N189 Chronic kidney disease, unspecified: Secondary | ICD-10-CM

## 2021-09-05 DIAGNOSIS — D631 Anemia in chronic kidney disease: Secondary | ICD-10-CM

## 2021-09-06 ENCOUNTER — Other Ambulatory Visit: Payer: Self-pay

## 2021-09-06 ENCOUNTER — Other Ambulatory Visit: Payer: Self-pay | Admitting: Hematology and Oncology

## 2021-09-06 ENCOUNTER — Inpatient Hospital Stay: Payer: Medicare Other | Attending: Oncology

## 2021-09-06 ENCOUNTER — Inpatient Hospital Stay: Payer: Medicare Other

## 2021-09-06 VITALS — BP 112/65 | HR 80 | Temp 98.0°F | Resp 18 | Wt 119.0 lb

## 2021-09-06 DIAGNOSIS — N183 Chronic kidney disease, stage 3 unspecified: Secondary | ICD-10-CM | POA: Insufficient documentation

## 2021-09-06 DIAGNOSIS — D631 Anemia in chronic kidney disease: Secondary | ICD-10-CM

## 2021-09-06 DIAGNOSIS — N189 Chronic kidney disease, unspecified: Secondary | ICD-10-CM

## 2021-09-06 LAB — CBC
MCV: 82 (ref 81–99)
RBC: 3.21 — AB (ref 3.87–5.11)

## 2021-09-06 LAB — CBC AND DIFFERENTIAL
HCT: 26 — AB (ref 36–46)
Hemoglobin: 8.7 — AB (ref 12.0–16.0)
Neutrophils Absolute: 4.62
Platelets: 272 (ref 150–399)
WBC: 7.7

## 2021-09-06 MED ORDER — EPOETIN ALFA-EPBX 10000 UNIT/ML IJ SOLN
10000.0000 [IU] | Freq: Once | INTRAMUSCULAR | Status: AC
  Administered 2021-09-06: 10000 [IU] via SUBCUTANEOUS
  Filled 2021-09-06: qty 1

## 2021-09-06 NOTE — Patient Instructions (Signed)
Epoetin Alfa injection What is this medication? EPOETIN ALFA (e POE e tin AL fa) helps your body make more red blood cells. This medicine is used to treat anemia caused by chronic kidney disease, cancer chemotherapy, or HIV-therapy. It may also be used before surgery if you have anemia. This medicine may be used for other purposes; ask your health care provider or pharmacist if you have questions. COMMON BRAND NAME(S): Epogen, Procrit, Retacrit What should I tell my care team before I take this medication? They need to know if you have any of these conditions: cancer heart disease high blood pressure history of blood clots history of stroke low levels of folate, iron, or vitamin B12 in the blood seizures an unusual or allergic reaction to erythropoietin, albumin, benzyl alcohol, hamster proteins, other medicines, foods, dyes, or preservatives pregnant or trying to get pregnant breast-feeding How should I use this medication? This medicine is for injection into a vein or under the skin. It is usually given by a health care professional in a hospital or clinic setting. If you get this medicine at home, you will be taught how to prepare and give this medicine. Use exactly as directed. Take your medicine at regular intervals. Do not take your medicine more often than directed. It is important that you put your used needles and syringes in a special sharps container. Do not put them in a trash can. If you do not have a sharps container, call your pharmacist or healthcare provider to get one. A special MedGuide will be given to you by the pharmacist with each prescription and refill. Be sure to read this information carefully each time. Talk to your pediatrician regarding the use of this medicine in children. While this drug may be prescribed for selected conditions, precautions do apply. Overdosage: If you think you have taken too much of this medicine contact a poison control center or emergency  room at once. NOTE: This medicine is only for you. Do not share this medicine with others. What if I miss a dose? If you miss a dose, take it as soon as you can. If it is almost time for your next dose, take only that dose. Do not take double or extra doses. What may interact with this medication? Interactions have not been studied. This list may not describe all possible interactions. Give your health care provider a list of all the medicines, herbs, non-prescription drugs, or dietary supplements you use. Also tell them if you smoke, drink alcohol, or use illegal drugs. Some items may interact with your medicine. What should I watch for while using this medication? Your condition will be monitored carefully while you are receiving this medicine. You may need blood work done while you are taking this medicine. This medicine may cause a decrease in vitamin B6. You should make sure that you get enough vitamin B6 while you are taking this medicine. Discuss the foods you eat and the vitamins you take with your health care professional. What side effects may I notice from receiving this medication? Side effects that you should report to your doctor or health care professional as soon as possible: allergic reactions like skin rash, itching or hives, swelling of the face, lips, or tongue seizures signs and symptoms of a blood clot such as breathing problems; changes in vision; chest pain; severe, sudden headache; pain, swelling, warmth in the leg; trouble speaking; sudden numbness or weakness of the face, arm or leg signs and symptoms of a stroke like  changes in vision; confusion; trouble speaking or understanding; severe headaches; sudden numbness or weakness of the face, arm or leg; trouble walking; dizziness; loss of balance or coordination Side effects that usually do not require medical attention (report to your doctor or health care professional if they continue or are  bothersome): chills cough dizziness fever headaches joint pain muscle cramps muscle pain nausea, vomiting pain, redness, or irritation at site where injected This list may not describe all possible side effects. Call your doctor for medical advice about side effects. You may report side effects to FDA at 1-800-FDA-1088. Where should I keep my medication? Keep out of the reach of children. Store in a refrigerator between 2 and 8 degrees C (36 and 46 degrees F). Do not freeze or shake. Throw away any unused portion if using a single-dose vial. Multi-dose vials can be kept in the refrigerator for up to 21 days after the initial dose. Throw away unused medicine. NOTE: This sheet is a summary. It may not cover all possible information. If you have questions about this medicine, talk to your doctor, pharmacist, or health care provider.  2022 Elsevier/Gold Standard (2017-03-25 00:00:00)

## 2021-09-07 ENCOUNTER — Ambulatory Visit (HOSPITAL_COMMUNITY): Payer: Medicare Other

## 2021-09-11 ENCOUNTER — Encounter: Payer: Self-pay | Admitting: Internal Medicine

## 2021-09-11 ENCOUNTER — Ambulatory Visit: Payer: Medicare Other | Admitting: Internal Medicine

## 2021-09-11 ENCOUNTER — Other Ambulatory Visit: Payer: Self-pay

## 2021-09-11 VITALS — BP 114/58 | HR 88 | Temp 98.8°F | Ht 62.0 in | Wt 119.8 lb

## 2021-09-11 DIAGNOSIS — R768 Other specified abnormal immunological findings in serum: Secondary | ICD-10-CM | POA: Diagnosis not present

## 2021-09-11 DIAGNOSIS — J9611 Chronic respiratory failure with hypoxia: Secondary | ICD-10-CM

## 2021-09-11 DIAGNOSIS — R0989 Other specified symptoms and signs involving the circulatory and respiratory systems: Secondary | ICD-10-CM | POA: Diagnosis not present

## 2021-09-11 DIAGNOSIS — I272 Pulmonary hypertension, unspecified: Secondary | ICD-10-CM | POA: Diagnosis not present

## 2021-09-11 NOTE — Progress Notes (Signed)
OV 09/11/2021  Subjective:  Patient ID: [name], female , DOB: 1932-03-28 , age 88 y.o. , MRN: [id number] , ADDRESS: [place] [id number] PCP [name], PA-C Patient Care Team: [name], PA-C as PCP - General (Physician Assistant) Josue Hector, MD as PCP - Cardiology (Cardiology) Dorisann Frames, MD as Consulting Physician (Radiation Oncology) Marice Potter, MD as Consulting Physician (Hematology and Oncology)  This Provider for this visit: Treatment Team:  Attending Provider: Brand Males, MD    09/11/2021 -   Chief Complaint  Patient presents with   Consult    Pt being referred by Dr. Aundra Dubin. Pt has been having problems with her O2 sats dropping down to the 70s even on O2. Denies any complaints of coughing.     HPI [name] 88 y.o. -pleasant frail female.  Presents with her daughter.  They live in [place].  History is gained from talking to her, her daughter, review of the charts and also secure chat with Dr. Loralie Champagne.  This concern to rule out ILD.  She has chronic anemia on erythropoietin.  As best as I can gather approximately 4 years ago patient got hypoxemic and was started on oxygen by the primary care physician.  The primary care physician is the one prescribing the oxygen.  This resulted in the referral to Dr. Loralie Champagne who diagnosed her with pulmonary hypertension.  Apparently several medications have been tried and they have settled on a regimen right now.  Patient has been stable all along on 4 L nasal cannula.  Over time her 6-minute walk distance did improve.  In 2019 it was 62 m.  By May 2022 it was 31 m.  She is able to do her ADLs.  In fact against the wishes of the family she take shower without oxygen.  She does not pass out for this.  Apparently she has been stable all along then some 2 weeks ago they noticed a decline in oxygen saturation.  On her 4 L nasal cannula she went into the 47s.  They  adjusted the oxygen to 4-1/2 L nasal cannula and since then she has been stable.  They also feel the symptoms have somewhat improved.  They did see Dr. Loralie Champagne on 08/24/2021.  At this time it was noted that the weight was up by 5 pounds.  No orthopnea.  No fever.  Not short of breath walking around the house.  On this January visit Dr. Loralie Champagne ordered a CT scan of the chest and then refer the patient to pulmonary.  He did increase the Lasix.  She feels she has improved since then.  The etiology of her pulmonary hypertension is never been clear.  In 2019 several autoimmune antibodies were positive.  See below.  She did see Dr. Amil Amen but specific connective tissue disease was ruled out.  Patient mentions that her husband died from rheumatoid arthritis.  He used to be a patient of the late Dr. Justine Null.  She herself has osteoarthritis she says.  She says she does not have rheumatoid arthritis [although I thought maybe her hands did have some deformities of RA].  She does not have any edema.  Her last pulmonary function test was in 2019 showed restriction with reduced DLCO.    CT Chest data in 2019 without contrast.  [Not a high-resolution CT chest]-did not show any evidence of ILD.  I personally visualized this image  SYMPTOM SCALE - ILD 09/11/2021  Current weight   O2 use 4.5L  Shortness of Breath 0 -> 5 scale with 5 being worst (score 6 If unable to do)  At rest 0  Simple tasks - showers, clothes change, eating, shaving 1  Household (dishes, doing bed, laundry) 1  Shopping Does not do  Walking level at own pace 2  Walking up Stairs 2  Total (30-36) Dyspnea Score 6  How bad is your cough? na  How bad is your fatigue Nl for age  How bad is nausea 3  How bad is vomiting?  2  How bad is diarrhea? 1  How bad is anxiety? 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  4L  pulse o 97% RA pusle ox 78%  PFT  PFT Results Latest Ref Rng & Units 05/21/2018  FVC-Pre L 1.50  FVC-Predicted Pre % 68  FVC-Post L 1.67  FVC-Predicted Post % 76  Pre FEV1/FVC % % 75  Post FEV1/FCV % % 74  FEV1-Pre L 1.12  FEV1-Predicted Pre % 69  FEV1-Post L 1.23  DLCO uncorrected ml/min/mmHg 7.50  DLCO UNC% % 34  DLCO corrected ml/min/mmHg 9.06  DLCO COR %Predicted % 42  DLVA Predicted % 73     Latest Reference Range & Units 05/08/18 12:01 05/08/18 12:02  SEE BELOW  Comment   Anti Nuclear Antibody (ANA) Negative  Positive !   CENTROMERE AB SCREEN 0.0 - 0.9 AI  >8.0 (H)  ds DNA Ab 0 - 9 IU/mL 1   RA Latex Turbid. 0.0 - 13.9 IU/mL 72.7 (H)   ENA SM Ab Ser-aCnc 0.0 - 0.9 AI <0.2   !: Data is abnormal (H): Data is abnormally high   has a past medical history of Anemia, Anxiety, Arthritis, Chronic lower back pain, Complication of anesthesia (2011), Coronary artery disease, Depression, Diabetes ([place]), Esophageal stricture, GERD (gastroesophageal reflux disease), Headache, High cholesterol, History of blood transfusion (2011; ?03/2012), Hypertension, Intestinal obstruction ([place]), Macular degeneration of left eye, Shingles, Squamous carcinoma (2013), Stroke ([place]) (1967), and Type II diabetes mellitus ([place]) (dx'd 11/2016).   reports that she quit smoking about 30 years ago. Her smoking use included cigarettes. She has a 1.00 pack-year smoking history. She has never used smokeless tobacco.  Past Surgical History:  Procedure Laterality Date   ABDOMINAL HYSTERECTOMY     APPENDECTOMY     BACK SURGERY     BALLOON DILATION N/A 06/24/2020   Procedure: BALLOON DILATION;  Surgeon: Clarene Essex, MD;  Location: [facility];  Service: Endoscopy;  Laterality: N/A;   CATARACT EXTRACTION, BILATERAL Bilateral    CHOLECYSTECTOMY     COLON SURGERY     COLONOSCOPY     CORONARY  ATHERECTOMY N/A 05/15/2018   Procedure: CORONARY ATHERECTOMY;  Surgeon: Leonie Man, MD;  Location: [facility];  Service: Cardiovascular;  Laterality: N/A;   CORONARY STENT INTERVENTION N/A 05/15/2018   Procedure: CORONARY STENT INTERVENTION;  Surgeon: Leonie Man, MD;  Location: [facility];  Service: Cardiovascular;  Laterality: N/A;   ESOPHAGOGASTRODUODENOSCOPY N/A 06/24/2020   Procedure: ESOPHAGOGASTRODUODENOSCOPY (EGD);  Surgeon: Clarene Essex, MD;  Location: [facility];  Service: Endoscopy;  Laterality: N/A;   ESOPHAGOGASTRODUODENOSCOPY (EGD) WITH ESOPHAGEAL DILATION  "  several times"   ESOPHAGOGASTRODUODENOSCOPY (EGD) WITH PROPOFOL N/A 12/31/2016   Procedure: ESOPHAGOGASTRODUODENOSCOPY (EGD) WITH PROPOFOL;  Surgeon: Wilford Corner, MD;  Location: [facility];  Service: Endoscopy;  Laterality: N/A;   EYE SURGERY     FRACTURE SURGERY     IR FLUORO GUIDE CV LINE RIGHT  01/02/2017   IR US GUIDE VASC ACCESS RIGHT  01/02/2017   JOINT REPLACEMENT     LEFT HEART CATH AND CORONARY ANGIOGRAPHY N/A 10/09/2018   Procedure: LEFT HEART CATH AND CORONARY ANGIOGRAPHY;  Surgeon: Larey Dresser, MD;  Location: [facility];  Service: Cardiovascular;  Laterality: N/A;   LUMBAR DISC SURGERY  10/25/2014   Right L4-L5 removal of seven free fragments of disk mostly posterolaterally.  Lysis of adhesion.  Microscope/notes 10/26/2014   LUMBAR LAMINECTOMY/DECOMPRESSION MICRODISCECTOMY Right 08/09/2014   Procedure: Right Lumbar Four to Five Microdiskectomy;  Surgeon: Floyce Stakes, MD;  Location: [facility];  Service: Neurosurgery;  Laterality: Right;  Right L4-5 Microdiskectomy   ORIF PERIPROSTHETIC FRACTURE  03/31/2012   Procedure: OPEN REDUCTION INTERNAL FIXATION (ORIF) PERIPROSTHETIC FRACTURE;  Surgeon: Mauri Pole, MD;  Location: [facility];  Service: Orthopedics;  Laterality: Left;  Open reduction internal fixation Left distal femur periprosthetic fracture   RIGHT/LEFT HEART  CATH AND CORONARY ANGIOGRAPHY N/A 05/14/2018   Procedure: RIGHT/LEFT HEART CATH AND CORONARY ANGIOGRAPHY;  Surgeon: Larey Dresser, MD;  Location: [facility];  Service: Cardiovascular;  Laterality: N/A;   SMALL INTESTINE SURGERY  2011   "really bad blockage; went into respiratory distress and was on ventilator after surgery; [facility]"   SQUAMOUS CELL CARCINOMA EXCISION  ~ 2012   S/P "cut off her head then 15 radiation txs"    TOTAL KNEE ARTHROPLASTY Bilateral    bilateral    Allergies  Allergen Reactions   Sulfa Antibiotics Swelling    Mouth and tongue swelling    Immunization History  Administered Date(s) Administered   Influenza, High Dose Seasonal PF 05/07/2017, 05/05/2018, 04/05/2021   Influenza-Unspecified 05/05/2013   PFIZER(Purple Top)SARS-COV-2 Vaccination 08/30/2019, 09/20/2019, 05/04/2020, 12/22/2020    No family history on file.   Current Outpatient Medications:    ALPRAZolam (XANAX) 0.5 MG tablet, Take 0.5 mg by mouth daily as needed for anxiety. , Disp: , Rfl:    aspirin EC 81 MG tablet, Take 1 tablet (81 mg total) by mouth daily., Disp: 30 tablet, Rfl: 11   atorvastatin (LIPITOR) 80 MG tablet, Take 1 tablet (80 mg total) by mouth daily., Disp: 90 tablet, Rfl: 3   carvedilol (COREG) 3.125 MG tablet, TAKE 1 TABLET BY MOUTH TWICE DAILY WITH A MEAL, Disp: 60 tablet, Rfl: 11   cyclobenzaprine (FLEXERIL) 10 MG tablet, Take 10 mg by mouth in the morning., Disp: , Rfl:    cycloSPORINE (RESTASIS) 0.05 % ophthalmic emulsion, SMARTSIG:In Eye(s), Disp: , Rfl:    Epoetin Alfa-epbx (RETACRIT IJ), Inject 1 Dose as directed every 21 ( twenty-one) days., Disp: , Rfl:    FEROSUL 325 (65 Fe) MG tablet, Take 325 mg by mouth daily., Disp: , Rfl:    furosemide (LASIX) 40 MG tablet, Take 1 tablet (40 mg total) by mouth 2 (two) times daily., Disp: 180 tablet, Rfl: 2   gabapentin (NEURONTIN) 300 MG capsule, Take 300 mg by mouth as needed., Disp: , Rfl:    lactose free  nutrition (BOOST) LIQD, Take 237 mLs by mouth 2 (two) times daily between meals. , Disp: , Rfl:    mirtazapine (REMERON) 30  MG tablet, Take 30 mg by mouth at bedtime., Disp: , Rfl:    OPSUMIT 10 MG tablet, Take 1 tablet (10 mg total) by mouth daily., Disp: 90 tablet, Rfl: 5   Riociguat (ADEMPAS) 2.5 MG TABS, Take 2.5 mg by mouth in the morning, at noon, and at bedtime., Disp: 180 tablet, Rfl: 3   traMADol-acetaminophen (ULTRACET) 37.5-325 MG tablet, Take 2 tablets by mouth 4 (four) times daily as needed for pain., Disp: , Rfl: 2   Treprostinil Diolamine ER 0.125 MG TBCR, Take 1 tablet by mouth in the morning, at noon, and at bedtime., Disp: , Rfl:       Objective:   Vitals:   09/11/21 1500 09/11/21 1547 09/11/21 1550  BP: (!) 114/58    Pulse: 81 91 88  Temp: 98.8 F (37.1 C)    TempSrc: Oral    SpO2: 98% (!) 78% 97%  Weight: 119 lb 12.8 oz (54.3 kg)    Height: 5' 2" (1.575 m)      Estimated body mass index is 21.91 kg/m as calculated from the following:   Height as of this encounter: 5' 2" (1.575 m).   Weight as of this encounter: 119 lb 12.8 oz (54.3 kg).  _0 @  Filed Weights   09/11/21 1500  Weight: 119 lb 12.8 oz (54.3 kg)     Physical Exam  General: No distress.  Frail female.  Looks stable.  Very pleasant.  Appropriate for age.  Cachectic Neuro: Alert and Oriented x 3. GCS 15. Speech normal Psych: Pleasant Resp:  Barrel Chest - no.  Wheeze - no, Crackles -does have bilateral bibasal crackles, No overt respiratory distress CVS: Normal heart sounds. Murmurs -systolic murmur present Ext: Stigmata of Connective Tissue Disease - STIGMATA of CONNECTIVE TISSUE DISEASE  - Distal digital fissuring (ie, "mechanic hands") - no - Distal digital tip ulceration -no -Inflammatory arthritis or polyarticular morning joint stiffness ?60 minutes - YES - Palmar telangiectasia - no - Raynaud phenomenon - no - Unexplained digital edema - no - Unexplained fixed rash on the  digital extensor surfaces (Gottron's sign) - no ... - Deformities of RA - POSSIBLY YES in my view - Scleroderma  - no - Malar Rash -  no  HEENT: Normal upper airway. PEERL +. No post nasal drip        Assessment:       ICD-10-CM   1. Chronic respiratory failure with hypoxia (HCC)  J96.11 Pulmonary function test    QuantiFERON-TB Gold Plus    ANA    Rheumatoid factor    Cyclic citrul peptide antibody, IgG    Anti-scleroderma antibody    Sjogren's syndrome antibods(ssa + ssb)    CK total and CKMB (cardiac)not at [facility]    Aldolase    Centromere Antibodies    Centromere Antibodies    Aldolase    CK total and CKMB (cardiac)not at [facility]    Sjogren's syndrome antibods(ssa + ssb)    Anti-scleroderma antibody    Cyclic citrul peptide antibody, IgG    Rheumatoid factor    ANA    QuantiFERON-TB Gold Plus    2. Pulmonary hypertension, unspecified (HCC)  I27.20 Pulmonary function test    QuantiFERON-TB Gold Plus    ANA    Rheumatoid factor    Cyclic citrul peptide antibody, IgG    Anti-scleroderma antibody    Sjogren's syndrome antibods(ssa + ssb)    CK total and CKMB (cardiac)not at [facility]    Aldolase  Centromere Antibodies    Centromere Antibodies    Aldolase    CK total and CKMB (cardiac)not at [facility]    Sjogren's syndrome antibods(ssa + ssb)    Anti-scleroderma antibody    Cyclic citrul peptide antibody, IgG    Rheumatoid factor    ANA    QuantiFERON-TB Gold Plus    3. ANA positive  R76.8 Pulmonary function test    QuantiFERON-TB Gold Plus    ANA    Rheumatoid factor    Cyclic citrul peptide antibody, IgG    Anti-scleroderma antibody    Sjogren's syndrome antibods(ssa + ssb)    CK total and CKMB (cardiac)not at [facility]    Aldolase    Centromere Antibodies    Centromere Antibodies    Aldolase    CK total and CKMB (cardiac)not at [facility]    Sjogren's syndrome antibods(ssa + ssb)    Anti-scleroderma antibody    Cyclic citrul peptide antibody, IgG    Rheumatoid factor     ANA    QuantiFERON-TB Gold Plus    4. Rheumatoid factor positive  R76.8 Pulmonary function test    QuantiFERON-TB Gold Plus    ANA    Rheumatoid factor    Cyclic citrul peptide antibody, IgG    Anti-scleroderma antibody    Sjogren's syndrome antibods(ssa + ssb)    CK total and CKMB (cardiac)not at [facility]    Aldolase    Centromere Antibodies    Centromere Antibodies    Aldolase    CK total and CKMB (cardiac)not at [facility]    Sjogren's syndrome antibods(ssa + ssb)    Anti-scleroderma antibody    Cyclic citrul peptide antibody, IgG    Rheumatoid factor    ANA    QuantiFERON-TB Gold Plus    5. Centromere antibody positive  R76.8 Pulmonary function test    QuantiFERON-TB Gold Plus    ANA    Rheumatoid factor    Cyclic citrul peptide antibody, IgG    Anti-scleroderma antibody    Sjogren's syndrome antibods(ssa + ssb)    CK total and CKMB (cardiac)not at [facility]    Aldolase    Centromere Antibodies    Centromere Antibodies    Aldolase    CK total and CKMB (cardiac)not at [facility]    Sjogren's syndrome antibods(ssa + ssb)    Anti-scleroderma antibody    Cyclic citrul peptide antibody, IgG    Rheumatoid factor    ANA    QuantiFERON-TB Gold Plus    6. Bibasilar crackles  R09.89 Pulmonary function test    QuantiFERON-TB Gold Plus    ANA    Rheumatoid factor    Cyclic citrul peptide antibody, IgG    Anti-scleroderma antibody    Sjogren's syndrome antibods(ssa + ssb)    CK total and CKMB (cardiac)not at [facility]    Aldolase    Centromere Antibodies    Centromere Antibodies    Aldolase    CK total and CKMB (cardiac)not at [facility]    Sjogren's syndrome antibods(ssa + ssb)    Anti-scleroderma antibody    Cyclic citrul peptide antibody, IgG    Rheumatoid factor    ANA    QuantiFERON-TB Gold Plus         Plan:     Patient Instructions     ICD-10-CM   1. Chronic respiratory failure with hypoxia (HCC)  J96.11     2. Pulmonary hypertension, unspecified (HCC)  I27.20     3. ANA  positive  R76.8  4. Rheumatoid factor positive  R76.8     5. Centromere antibody positive  R76.8     6. Bibasilar crackles  R09.89       It is possible that you have associated pulmonary fibrosis or otherwise called interstitial lung disease This is because you are at risk for this on basis of the fact you have crackles on exam, you have pulmonary hypertension and in 2019 several autoimmune antibodies were positive  Plan - Please keep your appointment with high-resolution CT scan of the chest supine and prone - Do blood work for QuantiFERON gold, ANA, rheumatoid factor, CCP, SCL 70, SSA, SSB, CK, aldolase, centromere antibody [last checked in 2019 and it will be worthwhile checking it again] - Do spirometry and DLCO -Continue oxygen to keep your pulse ox greater than 88% [okay to be on 4.5 L] -If you have interstitial lung disease a class of medications called antifibrotic's are indicated but they do have side effects and therefore we will not entertain this conversation at this point  Follow-up - Video visit or face-to-face visit in the next few to several weeks with Dr. Chase Caller or Patricia Nettle or Derl Barrow nurse practitioner -30-minute slot    SIGNATURE    Dr. Brand Males, M.D., F.C.C.P,  Pulmonary and Critical Care Medicine Staff Physician, [facility] Director - Interstitial Lung Disease  Program  Pulmonary [place] at [place], [place], [address]  Pager: [phone], If no answer or between  15:00h - 7:00h: call [phone] Telephone: [phone]  4:08 PM 09/11/2021

## 2021-09-11 NOTE — Patient Instructions (Addendum)
ICD-10-CM   1. Chronic respiratory failure with hypoxia (HCC)  J96.11     2. Pulmonary hypertension, unspecified (HCC)  I27.20     3. ANA positive  R76.8     4. Rheumatoid factor positive  R76.8     5. Centromere antibody positive  R76.8     6. Bibasilar crackles  R09.89       It is possible that you have associated pulmonary fibrosis or otherwise called interstitial lung disease This is because you are at risk for this on basis of the fact you have crackles on exam, you have pulmonary hypertension and in 2019 several autoimmune antibodies were positive  Plan - Please keep your appointment with high-resolution CT scan of the chest supine and prone - Do blood work for QuantiFERON gold, ANA, rheumatoid factor, CCP, SCL 70, SSA, SSB, CK, aldolase, centromere antibody [last checked in 2019 and it will be worthwhile checking it again] - Do spirometry and DLCO -Continue oxygen to keep your pulse ox greater than 88% [okay to be on 4.5 L] -If you have interstitial lung disease a class of medications called antifibrotic's are indicated but they do have side effects and therefore we will not entertain this conversation at this point  Follow-up - Video visit or face-to-face visit in the next few to several weeks with Dr. Chase Caller or Patricia Nettle or Derl Barrow nurse practitioner -30-minute slot

## 2021-09-12 LAB — ALDOLASE: Aldolase: 3.5 U/L (ref <=8.1)

## 2021-09-14 ENCOUNTER — Other Ambulatory Visit: Payer: Self-pay

## 2021-09-14 ENCOUNTER — Ambulatory Visit (HOSPITAL_COMMUNITY)
Admission: RE | Admit: 2021-09-14 | Discharge: 2021-09-14 | Disposition: A | Discharge status: Home / Self Care | Payer: Medicare Other | Source: Ambulatory Visit | Attending: Cardiology | Admitting: Cardiology

## 2021-09-14 DIAGNOSIS — I272 Pulmonary hypertension, unspecified: Secondary | ICD-10-CM | POA: Insufficient documentation

## 2021-09-14 DIAGNOSIS — J849 Interstitial pulmonary disease, unspecified: Secondary | ICD-10-CM | POA: Insufficient documentation

## 2021-09-14 LAB — QUANTIFERON-TB GOLD PLUS
Mitogen-NIL: 1.81 IU/mL
NIL: 0.05 IU/mL
QuantiFERON-TB Gold Plus: NEGATIVE
TB1-NIL: 0 IU/mL
TB2-NIL: <0 IU/mL

## 2021-09-14 LAB — CENTROMERE ANTIBODIES: Centromere Ab Screen: >8 AI — AB

## 2021-09-14 LAB — ANTI-SCLERODERMA ANTIBODY: Scleroderma (Scl-70) (ENA) Antibody, IgG: <1 AI

## 2021-09-14 LAB — ANTI-NUCLEAR AB-TITER (ANA TITER)
ANA TITER: 1:80 {titer} — ABNORMAL HIGH
ANA Titer 1: >=1:1280 {titer} — AB

## 2021-09-14 LAB — SJOGREN'S SYNDROME ANTIBODS(SSA + SSB)
SSA (Ro) (ENA) Antibody, IgG: 2.1 AI — AB
SSB (La) (ENA) Antibody, IgG: <1 AI

## 2021-09-14 LAB — CK TOTAL AND CKMB (NOT AT ARMC): Total CK: 33 U/L (ref 29–143)

## 2021-09-14 LAB — CYCLIC CITRUL PEPTIDE ANTIBODY, IGG: Cyclic Citrullin Peptide Ab: <16 UNITS

## 2021-09-14 LAB — ANA: Anti Nuclear Antibody (ANA): POSITIVE — AB

## 2021-09-14 LAB — RHEUMATOID FACTOR: Rheumatoid fact SerPl-aCnc: 106 IU/mL — ABNORMAL HIGH (ref <14)

## 2021-09-20 ENCOUNTER — Other Ambulatory Visit (HOSPITAL_COMMUNITY): Payer: Self-pay | Admitting: Cardiology

## 2021-09-21 NOTE — Progress Notes (Incomplete)
ID:  [name], DOB 12-Dec-1931, MRN [id number]   Provider location: [place] [facility] Type of Visit: Established patient   PCP:  [name], PA-C  Cardiologist:  Jenkins Rouge, MD HF Cardiology: Dr. Aundra Dubin   History of Present Illness: [name] is an 88 y.o. with history of DM, HTN, and prior osteomyelitis of jaw referred by Dr. Johnsie Cancel for evaluation of pulmonary hypertension/RV failure.  She reports an episode of PNA in 3/19 and says that she never recovered from this.  She had gradually progressive exertional dyspnea to the point where she was short of breath walking around the house.  She had an echo in 7/19 that showed mildly decreased LV systolic function but severely decreased RV systolic function with dilated RV and at least moderate pulmonary hypertension by echo estimation.  She saw her PCP in 8/19 and was noted to be hypoxemic at rest.  He started her on home oxygen 2 L/[place] which she continues.    She was referred to cardiology, saw Dr. Johnsie Cancel. Amlodipine was stopped and she was started empirically on sildenafil 20 mg tid for pulmonary hypertension.   She was referred to CHF clinic.    RHC/LHC was done in 10/19, showing complex severe bifurcation lesion involving the proximal LAD and large D2 ostium. She had complex PCI requiring atherectomy and DES to pLAD and ostial D2.  RHC showed PAH with PVR 11.6 WU and CI 1.7.  She subsequently was found to have a right groin pseudoaneurysm from cath.   She was started on Opsumit.  Serologies have suggested CREST syndrome.  However, she saw Dr. Amil Amen for rheumatology who told her that he did not think that she had a rheumatologic diagnosis (per her report).   Cardiolite was abnormal in 3/20, this was followed by [facility] in 3/20 that showed nonobstructive CAD.  Echo in 3/20 showed EF 55-60%, with moderately decreased RV systolic function, PASP 64 mmHg.   Echo (3/21) showed EF 55-60%, moderately dilated RV with  moderately decreased systolic function, PASP 38 mmHg. Echo in 5/22 showed EF 55-60%, basal inferior akinesis, mildly dilated RV with moderately decreased systolic function, D-shaped septum.   She returns today for followup of pulmonary hypertension and CAD.  She continues to wear home oxygen.  Weight is up about 5 lbs.  Last week, her oxygen saturation was lower and she increased her oxygen to 4.5 L by [place].  Anemia is improved on erythropoeitin. No cough or fever.  She is short of breath walking long distances and with moderate exertion.  No orthopnea/PND.  Not short of breath walking around her house.    6 minute walk (12/19): 79 m 6 minute walk (1/20): 121 m 6 minute walk (2/21): 91 m 6 minute walk (9/21): 152 m 6 minute walk (2/22): 152 m 6 minute walk (5/22): 259 m  Labs (12/19): K 3.9, creatinine 0.78 Labs (3/20): K 4, creatinine 1.1, hgb 10.2 Labs (12/20): LDL 67, TGs 28, K 4.4, creatinine 1.04 Labs (2/21): BNP 181, K 4, creatinine 0.94 Labs (5/21): K 4.2, creatinine 0.94, BNP 202 Labs (8/21): hgb 10.4 Labs (9/21): LDL 38, TGs 289 Labs (11/21): K 3.9, creatinine 0.84 Labs (12/21): K 4.9, creatinine 0.98, LDL 48, TGs 209, BNP 197 Labs (4/22): BNP 218, K 3.2, creatinine 0.98, hgb 8.3 Labs (6/22): K 4.8, creatinine 1.13 Labs (9/22): hgb 8.5 Labs (12/22): hgb 8.3, TSH normal, K 5, creatinine 1.4 Labs (1/23): hgb 9.3  PMH: 1. H/o osteomyelitis  of jaw.  2. Duodenal ulcers with upper GI bleeding. 3. Type 2 diabetes 4. Hyperlipidemia 5. Squamous cell carcinoma of scalp with radiation and surgery.  6. GERD 7. H/o back surgery  8. HTN 9. Pulmonary hypertension/RV failure: Echo (7/19) with EF 45-50%, moderate focal basal septal hypertrophy, mild diffuse hypokinesis, septal flattening, severe RV dilation with severely decreased systolic function, moderate TR, PASP 61 mmHg.   - CT chest (7/19): Mild emphysema, enlarged PA.  - V/Q scan (8/19): No evidence for chronic PE.  -  anti-centromere Ab positive, ANA positive, RF 73 => suspect CREST syndrome.  - RHC (10/19): mean RA 7, PA 64/21 mean 38, mean PCWP 8, CI 1.7, PVR 11.6 WU.  - Echo (3/20): EF 55-60%, moderately decreased RV systolic function, PASP 64 mmHg - Echo (3/21): EF 55-60%, moderately dilated RV with moderately decreased systolic function, PASP 38 mmHg. - Echo (5/22): EF 55-60%, basal inferior akinesis, mildly dilated RV with moderately decreased systolic function, D-shaped septum. 10. CAD: LHC (10/19) with 95% proximal LAD and 95% ostial D2.  Complicated bifurcation intervention with atherectomy and DES to proximal LAD and ostial D2.   - LHC (3/20): 40% mLAD, 30% mRCA.  11. Post-cath right groin pseudoaneurysm in 10/19.  12. Anemia: Fe deficiency.  FOBT negative.   SH: Lives in [place] alone, daughter lives very close.  Quit smoking > 20 years ago. No ETOH.   FH: No history of pulmonary hypertension.  Mother with "heart problems."  Sister with PPM.    Review of systems complete and found to be negative unless listed in HPI.   Current Outpatient Medications  Medication Sig Dispense Refill   ALPRAZolam (XANAX) 0.5 MG tablet Take 0.5 mg by mouth daily as needed for anxiety.      aspirin EC 81 MG tablet Take 1 tablet (81 mg total) by mouth daily. 30 tablet 11   atorvastatin (LIPITOR) 80 MG tablet Take 1 tablet (80 mg total) by mouth daily. 90 tablet 3   carvedilol (COREG) 3.125 MG tablet TAKE 1 TABLET BY MOUTH TWICE DAILY WITH A MEAL 60 tablet 11   cyclobenzaprine (FLEXERIL) 10 MG tablet Take 10 mg by mouth in the morning.     cycloSPORINE (RESTASIS) 0.05 % ophthalmic emulsion SMARTSIG:In Eye(s)     Epoetin Alfa-epbx (RETACRIT IJ) Inject 1 Dose as directed every 21 ( twenty-one) days.     FEROSUL 325 (65 Fe) MG tablet Take 325 mg by mouth daily.     furosemide (LASIX) 40 MG tablet Take 1 tablet (40 mg total) by mouth 2 (two) times daily. 180 tablet 2   gabapentin (NEURONTIN) 300 MG capsule Take 300 mg  by mouth as needed.     lactose free nutrition (BOOST) LIQD Take 237 mLs by mouth 2 (two) times daily between meals.      mirtazapine (REMERON) 30 MG tablet Take 30 mg by mouth at bedtime.     OPSUMIT 10 MG tablet Take 1 tablet (10 mg total) by mouth daily. 90 tablet 5   Riociguat (ADEMPAS) 2.5 MG TABS Take 2.5 mg by mouth in the morning, at noon, and at bedtime. 180 tablet 3   traMADol-acetaminophen (ULTRACET) 37.5-325 MG tablet Take 2 tablets by mouth 4 (four) times daily as needed for pain.  2   Treprostinil Diolamine ER 0.125 MG TBCR Take 1 tablet by mouth in the morning, at noon, and at bedtime.     No current facility-administered medications for this visit.   There were no  vitals taken for this visit.  Wt Readings from Last 3 Encounters:  09/11/21 54.3 kg (119 lb 12.8 oz)  09/06/21 54 kg (119 lb)  08/24/21 54.5 kg (120 lb 3.2 oz)   There were no vitals taken for this visit. General: NAD Neck: JVP 8 cm with HJR, no thyromegaly or thyroid nodule.  Lungs: Dry crackles bilateral bases.  CV: Nondisplaced PMI.  Heart regular S1/S2, no S3/S4, no murmur.  Trace ankle edema.  No carotid bruit.  Normal pedal pulses.  Abdomen: Soft, nontender, no hepatosplenomegaly, no distention.  Skin: Intact without lesions or rashes.  Neurologic: Alert and oriented x 3.  Psych: Normal affect. Extremities: No clubbing or cyanosis.  HEENT: Normal.   Assessment/Plan: 1. CAD: Coronary angiography showed complex bifurcation lesion with 95% stenosis proximal LAD with calcification at take-off of large D2. The ostial/proximal D2 also had 95% calcified stenosis. Suspect this lesion played a role in her exertional dyspnea and also in her mildly decreased LV systolic function (EF 35-36% on echo in 7/19).  S/p successful atherectomy with bifurcation stenting of proximal LAD and D2 05/15/18.  Repeat LHC in 3/20 showed nonobstructive disease.  No chest pain.  - Continue ASA 81.  - Plavix stopped with anemia.   -  Continue atorvastatin, check lipids today.  - She is no longer on Vascepa, will assess triglycerides on today's lipid panel.  2. Pulmonary hypertension: She appeared to have at least moderate pulmonary hypertension by echo in 7/19 with RV failure. CT chest in 2019 showed mild emphysema, which should not explain her degree of RV failure.  V/Q scan did not show evidence for chronic PEs. Anti-centromere antibody, ANA, and RF were all positive.  ?CREST variant of scleroderma.  She is on home oxygen.  [place] 05/15/18 showed moderate PAH but very high PVR and low cardiac output.  This was concerning for advanced pulmonary hypertension.  Repeat echo in 3/21 showed moderately dilated/dysfunctional RV but PASP estimate was lower.  Echo in 5/22 showed mildly dilated/moderately dysfunctional RV, TR doppler jet not complete so probably not accurate PA pressure estimate.  She did not tolerate Uptravi.  Symptomatically, NYHA class II-III, stable.  Defer 6 minute walk today with volume overload.   - Continue riociguat, now on goal dose.  - Continue Opsumit 10 mg daily.  - Continue orenitram, she is unable to titrate any higher due to GI side effects.   - Refused sleep study.  - Continue home oxygen.  - BMET/BNP today.   - I will repeat high resolution CT chest w/o contrast given some worsening of hypoxemia, ?scleroderma-related ILD.  She will get a CXR today with cough.  - With gradually worsening hypoxemia, she needs a pulmonologist. I will refer to Dr. Silas Flood.  3. Chronic diastolic CHF/RV failure:  NYHA II-III.  She is mildly volume overloaded on exam today.   - Increase Lasix to 60 qam/40 qpm x 2 days then 40 mg bid after that. BMET today and again in 10 days.     4. ?CREST syndrome: Patient saw [facility]. Per her report, they did not think that she had CREST syndrome.    5. Anemia: Fe deficiency.  No overt GI bleeding, per her report FOBT was negative.  She is being followed by hematology and is on  erythropoeitin.  It is possible that the anemia comes from Opsumit or Adempas.  However, she has improved symptomatically with these medications.  Would like to continue unless hematologist feels strongly about trying her  off of one of them.  Recently, hemoglobin has been improving => 9.3 most recently.  - Has appt with hematology again soon.   Followup with APP in 1 month.   [place] 09/21/2021

## 2021-09-24 ENCOUNTER — Other Ambulatory Visit: Payer: Self-pay

## 2021-09-24 ENCOUNTER — Encounter (HOSPITAL_COMMUNITY): Payer: Medicare Other

## 2021-09-24 ENCOUNTER — Inpatient Hospital Stay (HOSPITAL_COMMUNITY)
Admission: EM | Admit: 2021-09-24 | Discharge: 2021-09-28 | DRG: 189 | Disposition: A | Discharge status: Home Health | Payer: Medicare Other | Attending: Internal Medicine | Admitting: Internal Medicine

## 2021-09-24 ENCOUNTER — Emergency Department (HOSPITAL_COMMUNITY): Payer: Medicare Other

## 2021-09-24 ENCOUNTER — Encounter (HOSPITAL_COMMUNITY): Payer: Self-pay | Admitting: Emergency Medicine

## 2021-09-24 DIAGNOSIS — G8929 Other chronic pain: Secondary | ICD-10-CM | POA: Diagnosis present

## 2021-09-24 DIAGNOSIS — Z8673 Personal history of transient ischemic attack (TIA), and cerebral infarction without residual deficits: Secondary | ICD-10-CM

## 2021-09-24 DIAGNOSIS — J84112 Idiopathic pulmonary fibrosis: Secondary | ICD-10-CM | POA: Diagnosis present

## 2021-09-24 DIAGNOSIS — N183 Chronic kidney disease, stage 3 unspecified: Secondary | ICD-10-CM | POA: Diagnosis present

## 2021-09-24 DIAGNOSIS — M545 Low back pain, unspecified: Secondary | ICD-10-CM | POA: Diagnosis present

## 2021-09-24 DIAGNOSIS — N1832 Chronic kidney disease, stage 3b: Secondary | ICD-10-CM | POA: Diagnosis present

## 2021-09-24 DIAGNOSIS — Z79891 Long term (current) use of opiate analgesic: Secondary | ICD-10-CM

## 2021-09-24 DIAGNOSIS — K2100 Gastro-esophageal reflux disease with esophagitis, without bleeding: Secondary | ICD-10-CM | POA: Diagnosis not present

## 2021-09-24 DIAGNOSIS — I272 Pulmonary hypertension, unspecified: Secondary | ICD-10-CM | POA: Diagnosis not present

## 2021-09-24 DIAGNOSIS — Z515 Encounter for palliative care: Secondary | ICD-10-CM | POA: Diagnosis not present

## 2021-09-24 DIAGNOSIS — J90 Pleural effusion, not elsewhere classified: Secondary | ICD-10-CM | POA: Diagnosis present

## 2021-09-24 DIAGNOSIS — E871 Hypo-osmolality and hyponatremia: Secondary | ICD-10-CM | POA: Diagnosis present

## 2021-09-24 DIAGNOSIS — E1122 Type 2 diabetes mellitus with diabetic chronic kidney disease: Secondary | ICD-10-CM | POA: Diagnosis present

## 2021-09-24 DIAGNOSIS — I2510 Atherosclerotic heart disease of native coronary artery without angina pectoris: Secondary | ICD-10-CM | POA: Diagnosis present

## 2021-09-24 DIAGNOSIS — Z882 Allergy status to sulfonamides status: Secondary | ICD-10-CM

## 2021-09-24 DIAGNOSIS — Z85828 Personal history of other malignant neoplasm of skin: Secondary | ICD-10-CM

## 2021-09-24 DIAGNOSIS — Z66 Do not resuscitate: Secondary | ICD-10-CM | POA: Diagnosis present

## 2021-09-24 DIAGNOSIS — I5032 Chronic diastolic (congestive) heart failure: Secondary | ICD-10-CM | POA: Insufficient documentation

## 2021-09-24 DIAGNOSIS — M1990 Unspecified osteoarthritis, unspecified site: Secondary | ICD-10-CM | POA: Diagnosis present

## 2021-09-24 DIAGNOSIS — D631 Anemia in chronic kidney disease: Secondary | ICD-10-CM | POA: Diagnosis present

## 2021-09-24 DIAGNOSIS — I5033 Acute on chronic diastolic (congestive) heart failure: Secondary | ICD-10-CM | POA: Diagnosis not present

## 2021-09-24 DIAGNOSIS — N179 Acute kidney failure, unspecified: Secondary | ICD-10-CM | POA: Diagnosis present

## 2021-09-24 DIAGNOSIS — Z87891 Personal history of nicotine dependence: Secondary | ICD-10-CM

## 2021-09-24 DIAGNOSIS — D509 Iron deficiency anemia, unspecified: Secondary | ICD-10-CM | POA: Diagnosis present

## 2021-09-24 DIAGNOSIS — J849 Interstitial pulmonary disease, unspecified: Secondary | ICD-10-CM | POA: Diagnosis not present

## 2021-09-24 DIAGNOSIS — E7800 Pure hypercholesterolemia, unspecified: Secondary | ICD-10-CM | POA: Diagnosis present

## 2021-09-24 DIAGNOSIS — F419 Anxiety disorder, unspecified: Secondary | ICD-10-CM | POA: Diagnosis present

## 2021-09-24 DIAGNOSIS — K219 Gastro-esophageal reflux disease without esophagitis: Secondary | ICD-10-CM | POA: Diagnosis present

## 2021-09-24 DIAGNOSIS — Z96653 Presence of artificial knee joint, bilateral: Secondary | ICD-10-CM | POA: Diagnosis present

## 2021-09-24 DIAGNOSIS — J189 Pneumonia, unspecified organism: Secondary | ICD-10-CM | POA: Diagnosis present

## 2021-09-24 DIAGNOSIS — Z20822 Contact with and (suspected) exposure to covid-19: Secondary | ICD-10-CM | POA: Diagnosis present

## 2021-09-24 DIAGNOSIS — K449 Diaphragmatic hernia without obstruction or gangrene: Secondary | ICD-10-CM | POA: Diagnosis present

## 2021-09-24 DIAGNOSIS — Z9049 Acquired absence of other specified parts of digestive tract: Secondary | ICD-10-CM

## 2021-09-24 DIAGNOSIS — F32A Depression, unspecified: Secondary | ICD-10-CM | POA: Diagnosis present

## 2021-09-24 DIAGNOSIS — I10 Essential (primary) hypertension: Secondary | ICD-10-CM | POA: Diagnosis present

## 2021-09-24 DIAGNOSIS — Z955 Presence of coronary angioplasty implant and graft: Secondary | ICD-10-CM

## 2021-09-24 DIAGNOSIS — E785 Hyperlipidemia, unspecified: Secondary | ICD-10-CM | POA: Diagnosis present

## 2021-09-24 DIAGNOSIS — I2721 Secondary pulmonary arterial hypertension: Secondary | ICD-10-CM | POA: Diagnosis present

## 2021-09-24 DIAGNOSIS — J440 Chronic obstructive pulmonary disease with acute lower respiratory infection: Secondary | ICD-10-CM | POA: Diagnosis present

## 2021-09-24 DIAGNOSIS — I25119 Atherosclerotic heart disease of native coronary artery with unspecified angina pectoris: Secondary | ICD-10-CM | POA: Diagnosis present

## 2021-09-24 DIAGNOSIS — H9193 Unspecified hearing loss, bilateral: Secondary | ICD-10-CM | POA: Diagnosis present

## 2021-09-24 DIAGNOSIS — E878 Other disorders of electrolyte and fluid balance, not elsewhere classified: Secondary | ICD-10-CM | POA: Diagnosis present

## 2021-09-24 DIAGNOSIS — I130 Hypertensive heart and chronic kidney disease with heart failure and stage 1 through stage 4 chronic kidney disease, or unspecified chronic kidney disease: Secondary | ICD-10-CM | POA: Diagnosis present

## 2021-09-24 DIAGNOSIS — Z7189 Other specified counseling: Secondary | ICD-10-CM | POA: Diagnosis not present

## 2021-09-24 DIAGNOSIS — J9621 Acute and chronic respiratory failure with hypoxia: Secondary | ICD-10-CM | POA: Diagnosis present

## 2021-09-24 DIAGNOSIS — Z7982 Long term (current) use of aspirin: Secondary | ICD-10-CM

## 2021-09-24 DIAGNOSIS — Z9981 Dependence on supplemental oxygen: Secondary | ICD-10-CM

## 2021-09-24 DIAGNOSIS — Z90710 Acquired absence of both cervix and uterus: Secondary | ICD-10-CM

## 2021-09-24 DIAGNOSIS — E119 Type 2 diabetes mellitus without complications: Secondary | ICD-10-CM

## 2021-09-24 DIAGNOSIS — Z923 Personal history of irradiation: Secondary | ICD-10-CM

## 2021-09-24 DIAGNOSIS — Z79899 Other long term (current) drug therapy: Secondary | ICD-10-CM

## 2021-09-24 DIAGNOSIS — N189 Chronic kidney disease, unspecified: Secondary | ICD-10-CM | POA: Diagnosis present

## 2021-09-24 LAB — CBC WITH DIFFERENTIAL/PLATELET
Abs Immature Granulocytes: 0.04 10*3/uL (ref 0.00–0.07)
Basophils Absolute: 0 10*3/uL (ref 0.0–0.1)
Basophils Relative: 0 %
Eosinophils Absolute: 0.3 10*3/uL (ref 0.0–0.5)
Eosinophils Relative: 4 %
HCT: 27.5 % — ABNORMAL LOW (ref 36.0–46.0)
Hemoglobin: 7.6 g/dL — ABNORMAL LOW (ref 12.0–15.0)
Immature Granulocytes: 1 %
Lymphocytes Relative: 21 %
Lymphs Abs: 1.6 10*3/uL (ref 0.7–4.0)
MCH: 24.7 pg — ABNORMAL LOW (ref 26.0–34.0)
MCHC: 27.6 g/dL — ABNORMAL LOW (ref 30.0–36.0)
MCV: 89.3 fL (ref 80.0–100.0)
Monocytes Absolute: 0.7 10*3/uL (ref 0.1–1.0)
Monocytes Relative: 9 %
Neutro Abs: 4.9 10*3/uL (ref 1.7–7.7)
Neutrophils Relative %: 65 %
Platelets: 378 10*3/uL (ref 150–400)
RBC: 3.08 MIL/uL — ABNORMAL LOW (ref 3.87–5.11)
RDW: 17 % — ABNORMAL HIGH (ref 11.5–15.5)
WBC: 7.6 10*3/uL (ref 4.0–10.5)
nRBC: 0 % (ref 0.0–0.2)

## 2021-09-24 LAB — COMPREHENSIVE METABOLIC PANEL
ALT: 30 U/L (ref 0–44)
AST: 21 U/L (ref 15–41)
Albumin: 2.6 g/dL — ABNORMAL LOW (ref 3.5–5.0)
Alkaline Phosphatase: 46 U/L (ref 38–126)
Anion gap: 10 (ref 5–15)
BUN: 66 mg/dL — ABNORMAL HIGH (ref 8–23)
CO2: 27 mmol/L (ref 22–32)
Calcium: 8.4 mg/dL — ABNORMAL LOW (ref 8.9–10.3)
Chloride: 97 mmol/L — ABNORMAL LOW (ref 98–111)
Creatinine, Ser: 1.67 mg/dL — ABNORMAL HIGH (ref 0.44–1.00)
GFR, Estimated: 29 mL/min — ABNORMAL LOW (ref >60)
Glucose, Bld: 145 mg/dL — ABNORMAL HIGH (ref 70–99)
Potassium: 4.7 mmol/L (ref 3.5–5.1)
Sodium: 134 mmol/L — ABNORMAL LOW (ref 135–145)
Total Bilirubin: 0.2 mg/dL — ABNORMAL LOW (ref 0.3–1.2)
Total Protein: 5.8 g/dL — ABNORMAL LOW (ref 6.5–8.1)

## 2021-09-24 LAB — CBC
HCT: 24.9 % — ABNORMAL LOW (ref 36.0–46.0)
Hemoglobin: 7.3 g/dL — ABNORMAL LOW (ref 12.0–15.0)
MCH: 25.6 pg — ABNORMAL LOW (ref 26.0–34.0)
MCHC: 29.3 g/dL — ABNORMAL LOW (ref 30.0–36.0)
MCV: 87.4 fL (ref 80.0–100.0)
Platelets: 314 10*3/uL (ref 150–400)
RBC: 2.85 MIL/uL — ABNORMAL LOW (ref 3.87–5.11)
RDW: 16.9 % — ABNORMAL HIGH (ref 11.5–15.5)
WBC: 6.6 10*3/uL (ref 4.0–10.5)
nRBC: 0 % (ref 0.0–0.2)

## 2021-09-24 LAB — HEPATIC FUNCTION PANEL
ALT: 31 U/L (ref 0–44)
AST: 22 U/L (ref 15–41)
Albumin: 2.6 g/dL — ABNORMAL LOW (ref 3.5–5.0)
Alkaline Phosphatase: 47 U/L (ref 38–126)
Bilirubin, Direct: <0.1 mg/dL (ref 0.0–0.2)
Total Bilirubin: 0.2 mg/dL — ABNORMAL LOW (ref 0.3–1.2)
Total Protein: 6.2 g/dL — ABNORMAL LOW (ref 6.5–8.1)

## 2021-09-24 LAB — LACTIC ACID, PLASMA: Lactic Acid, Venous: 0.5 mmol/L (ref 0.5–1.9)

## 2021-09-24 LAB — RESP PANEL BY RT-PCR (FLU A&B, COVID) ARPGX2
Influenza A by PCR: NEGATIVE
Influenza B by PCR: NEGATIVE
SARS Coronavirus 2 by RT PCR: NEGATIVE

## 2021-09-24 LAB — PROCALCITONIN: Procalcitonin: 0.1 ng/mL

## 2021-09-24 LAB — BRAIN NATRIURETIC PEPTIDE: B Natriuretic Peptide: 695.1 pg/mL — ABNORMAL HIGH (ref 0.0–100.0)

## 2021-09-24 LAB — GLUCOSE, CAPILLARY
Glucose-Capillary: 120 mg/dL — ABNORMAL HIGH (ref 70–99)
Glucose-Capillary: 97 mg/dL (ref 70–99)

## 2021-09-24 LAB — MAGNESIUM: Magnesium: 2.1 mg/dL (ref 1.7–2.4)

## 2021-09-24 MED ORDER — TRAMADOL-ACETAMINOPHEN 37.5-325 MG PO TABS
1.0000 | ORAL_TABLET | ORAL | Status: DC | PRN
  Administered 2021-09-25: 1 via ORAL
  Filled 2021-09-24: qty 1

## 2021-09-24 MED ORDER — TREPROSTINIL DIOLAMINE ER 0.125 MG PO TBCR: 1.0000 | EXTENDED_RELEASE_TABLET | Freq: Three times a day (TID) | ORAL | Status: DC

## 2021-09-24 MED ORDER — INSULIN ASPART 100 UNIT/ML IJ SOLN
0.0000 [IU] | Freq: Three times a day (TID) | INTRAMUSCULAR | Status: DC
  Administered 2021-09-25: 2 [IU] via SUBCUTANEOUS
  Administered 2021-09-26 – 2021-09-27 (×4): 1 [IU] via SUBCUTANEOUS
  Administered 2021-09-27 – 2021-09-28 (×2): 2 [IU] via SUBCUTANEOUS

## 2021-09-24 MED ORDER — MIRTAZAPINE 15 MG PO TABS
30.0000 mg | ORAL_TABLET | Freq: Every day | ORAL | Status: DC
Start: 2021-09-24 — End: 2021-09-28
  Administered 2021-09-24 – 2021-09-27 (×4): 30 mg via ORAL
  Filled 2021-09-24 (×4): qty 2

## 2021-09-24 MED ORDER — SODIUM CHLORIDE 0.9 % IV SOLN
1.0000 g | INTRAVENOUS | Status: DC
  Administered 2021-09-25 – 2021-09-27 (×3): 1 g via INTRAVENOUS
  Filled 2021-09-24 (×3): qty 10

## 2021-09-24 MED ORDER — ACETAMINOPHEN 325 MG PO TABS
650.0000 mg | ORAL_TABLET | Freq: Four times a day (QID) | ORAL | Status: DC | PRN
Start: 2021-09-24 — End: 2021-09-28

## 2021-09-24 MED ORDER — SODIUM CHLORIDE 0.9 % IV SOLN: INTRAVENOUS | Status: AC

## 2021-09-24 MED ORDER — MELATONIN 5 MG PO TABS
10.0000 mg | ORAL_TABLET | Freq: Every evening | ORAL | Status: DC | PRN
  Administered 2021-09-26 – 2021-09-27 (×2): 10 mg via ORAL
  Filled 2021-09-24 (×3): qty 2

## 2021-09-24 MED ORDER — SODIUM CHLORIDE 0.9 % IV SOLN
500.0000 mg | INTRAVENOUS | Status: AC
  Administered 2021-09-24: 500 mg via INTRAVENOUS
  Filled 2021-09-24: qty 5

## 2021-09-24 MED ORDER — MACITENTAN 10 MG PO TABS: 10.0000 mg | ORAL_TABLET | Freq: Every day | ORAL | Status: DC

## 2021-09-24 MED ORDER — AZITHROMYCIN 250 MG PO TABS
500.0000 mg | ORAL_TABLET | Freq: Every evening | ORAL | Status: DC
  Administered 2021-09-25 – 2021-09-27 (×3): 500 mg via ORAL
  Filled 2021-09-24 (×4): qty 2

## 2021-09-24 MED ORDER — CYCLOSPORINE 0.05 % OP EMUL
1.0000 [drp] | Freq: Two times a day (BID) | OPHTHALMIC | Status: DC
  Filled 2021-09-24 (×2): qty 30

## 2021-09-24 MED ORDER — ACETAMINOPHEN 650 MG RE SUPP: 650.0000 mg | Freq: Four times a day (QID) | RECTAL | Status: DC | PRN

## 2021-09-24 MED ORDER — CARVEDILOL 3.125 MG PO TABS
3.1250 mg | ORAL_TABLET | Freq: Two times a day (BID) | ORAL | Status: DC
  Administered 2021-09-25 – 2021-09-28 (×7): 3.125 mg via ORAL
  Filled 2021-09-24 (×7): qty 1

## 2021-09-24 MED ORDER — SODIUM CHLORIDE 0.9 % IV SOLN: 250.0000 mL | INTRAVENOUS | Status: DC | PRN

## 2021-09-24 MED ORDER — SODIUM CHLORIDE 0.9% FLUSH
3.0000 mL | Freq: Two times a day (BID) | INTRAVENOUS | Status: DC
  Administered 2021-09-25 – 2021-09-28 (×7): 3 mL via INTRAVENOUS

## 2021-09-24 MED ORDER — MACITENTAN 10 MG PO TABS
10.0000 mg | ORAL_TABLET | Freq: Every day | ORAL | Status: DC
  Administered 2021-09-25 – 2021-09-28 (×4): 10 mg via ORAL
  Filled 2021-09-24 (×4): qty 1

## 2021-09-24 MED ORDER — ALPRAZOLAM 0.5 MG PO TABS
0.5000 mg | ORAL_TABLET | Freq: Every day | ORAL | Status: DC | PRN
  Administered 2021-09-24 – 2021-09-28 (×5): 0.5 mg via ORAL
  Filled 2021-09-24 (×5): qty 1

## 2021-09-24 MED ORDER — CYCLOBENZAPRINE HCL 10 MG PO TABS
10.0000 mg | ORAL_TABLET | Freq: Every morning | ORAL | Status: DC
  Administered 2021-09-25 – 2021-09-27 (×3): 10 mg via ORAL
  Filled 2021-09-24 (×4): qty 1

## 2021-09-24 MED ORDER — RIOCIGUAT 2.5 MG PO TABS: 2.5000 mg | ORAL_TABLET | Freq: Three times a day (TID) | ORAL | Status: DC

## 2021-09-24 MED ORDER — SODIUM CHLORIDE 0.9% FLUSH: 3.0000 mL | INTRAVENOUS | Status: DC | PRN

## 2021-09-24 MED ORDER — SALINE SPRAY 0.65 % NA SOLN
1.0000 | NASAL | Status: DC | PRN
  Administered 2021-09-24: 1 via NASAL
  Filled 2021-09-24: qty 44

## 2021-09-24 MED ORDER — ENOXAPARIN SODIUM 30 MG/0.3ML IJ SOSY
30.0000 mg | PREFILLED_SYRINGE | INTRAMUSCULAR | Status: DC
  Administered 2021-09-24: 30 mg via SUBCUTANEOUS
  Filled 2021-09-24: qty 0.3

## 2021-09-24 MED ORDER — ASPIRIN EC 81 MG PO TBEC
81.0000 mg | DELAYED_RELEASE_TABLET | Freq: Every day | ORAL | Status: DC
  Administered 2021-09-25 – 2021-09-28 (×4): 81 mg via ORAL
  Filled 2021-09-24 (×4): qty 1

## 2021-09-24 MED ORDER — SODIUM CHLORIDE 0.9 % IV SOLN
1.0000 g | Freq: Once | INTRAVENOUS | Status: AC
  Administered 2021-09-24: 1 g via INTRAVENOUS
  Filled 2021-09-24: qty 10

## 2021-09-24 MED ORDER — AZITHROMYCIN 500 MG IV SOLR: 500.0000 mg | Freq: Once | INTRAVENOUS | Status: DC

## 2021-09-24 MED ORDER — ATORVASTATIN CALCIUM 80 MG PO TABS
80.0000 mg | ORAL_TABLET | Freq: Every day | ORAL | Status: DC
  Administered 2021-09-24 – 2021-09-28 (×5): 80 mg via ORAL
  Filled 2021-09-24 (×5): qty 1

## 2021-09-24 MED ORDER — FUROSEMIDE 10 MG/ML IJ SOLN
20.0000 mg | Freq: Once | INTRAMUSCULAR | Status: AC
Start: 2021-09-24 — End: 2021-09-24
  Administered 2021-09-24: 20 mg via INTRAVENOUS
  Filled 2021-09-24: qty 2

## 2021-09-24 NOTE — Assessment & Plan Note (Deleted)
CXR with large RLL pnuemonia. History concerning for possible aspiration?  Clinically she has no WBC, cough, or other sepsis criteria, lactic acid wnl -given rocephin and azithromycin in ED, will continue for now -blood cultures ordered -procalcitonin -covid/flu pending and respiratory panel ordered -aspiration precautions and NPO -SLP eval/tx

## 2021-09-24 NOTE — Progress Notes (Addendum)
Pharmacy Consult for Pulmonary Hypertension Treatment   Indication - Continuation of prior to admission medication   Patient is 88 y.o.  with history of PAH on chronic Macitentan (Opsumit) PTA and will be continued while hospitalized.   Continuing this medication order as an inpatient requires that monitoring parameters per REMS requirements must be met.  Chronic therapy is under the supervision of Dr. Loralie Champagne who is enrolled in the REMS program and is being notified of continuation of therapy. A staff message in EPIC has been sent notifying the certified prescriber.  Per patient's daughter, who is familiar with her medications and treatment (patient is too lethargic at this time), patient has previously been educated on Pregnancy Risk and Hepatotoxicity . On admission pregnancy risk has been assessed and no monitoring required. Hepatic function has been evaluated. AST/ALT appropriate to continue medication at this time.  Hepatic Function Latest Ref Rng & Units 09/24/2021 07/17/2021 05/22/2021  Total Protein 6.5 - 8.1 g/dL 5.8(L) - -  Albumin 3.5 - 5.0 g/dL 2.6(L) 3.6 3.6  AST 15 - 41 U/L _0 ALT 0 - 44 U/L _1 Alk Phosphatase 38 - 126 U/L 46 59 61  Total Bilirubin 0.3 - 1.2 mg/dL 0.2(L) - -    If any question arise or pregnancy is identified during hospitalization, contact for bosentan and macitentan: [phone]; ambrisentan: [phone].  Thank for you allowing [place] to participate in the care of this patient.  Benetta Spar, PharmD, BCPS, BCCP Clinical Pharmacist  Please check AMION for all [place] phone numbers After 10:00 PM, call [facility] [phone]

## 2021-09-24 NOTE — ED Provider Notes (Signed)
[name]   CSN: [id number] Arrival date & time: 09/24/21  1237     History  Chief Complaint  Patient presents with   Weakness    [name] is a 88 y.o. female.  88 yo woman presents with weakness and respiratory distress. Patient reports that she started feeling poorly on Sunday a week ago. She reports that she "just didn't feel well," she denies any runny nose, sore throat, cough, fever, chills,  n/v/d. Her daughter is bedside and reports that her breathing has gotten worse, but she will not tolerate 5L [place] at home and declined going to her doctor or coming to the hospital. Today she was supposed to follow up with her cardiologist, but she felt too unwell to go, and agreed to come for evaluation at behest of daughter/cardiologist. Her daughter reports that she has intermittently desatted to the 80s at home, and had one reading at 2 that then came back up earlier today. She has pulmonary hypertension and is on 4L of O2 at baseline. Her daughter notes that she has had no appetite as well and has only been taking small sips of water. Daughter reprots one episode of subjectively warm with sweating last week, suspected fever, but no thermometer to measure. Patient is mentating at her baseline, confirmed by daughter. She states that she wishes to be DNR in case of emergency, but would like work up and treatment today.      Home Medications Prior to Admission medications   Medication Sig Start Date End Date Taking? Authorizing Provider  ALPRAZolam Duanne Moron) 0.5 MG tablet Take 0.5 mg by mouth daily as needed for anxiety.  09/14/19   [provider]  aspirin EC 81 MG tablet Take 1 tablet (81 mg total) by mouth daily. 05/16/18   Bhagat, Crista Luria, PA  atorvastatin (LIPITOR) 80 MG tablet Take 1 tablet (80 mg total) by mouth daily. 12/06/20   Larey Dresser, MD  carvedilol (COREG) 3.125 MG tablet TAKE 1 TABLET BY MOUTH TWICE DAILY  WITH A MEAL 09/03/21   Larey Dresser, MD  cyclobenzaprine (FLEXERIL) 10 MG tablet Take 10 mg by mouth in the morning.    [provider]  cycloSPORINE (RESTASIS) 0.05 % ophthalmic emulsion SMARTSIG:In Eye(s) 08/07/21   [provider]  Epoetin Alfa-epbx (RETACRIT IJ) Inject 1 Dose as directed every 21 ( twenty-one) days.    [provider]  FEROSUL 325 (65 Fe) MG tablet Take 325 mg by mouth daily. 07/04/21   [provider]  furosemide (LASIX) 40 MG tablet Take 1 tablet (40 mg total) by mouth 2 (two) times daily. 08/24/21   Larey Dresser, MD  gabapentin (NEURONTIN) 300 MG capsule Take 300 mg by mouth as needed.    [provider]  lactose free nutrition (BOOST) LIQD Take 237 mLs by mouth 2 (two) times daily between meals.     [provider]  mirtazapine (REMERON) 30 MG tablet Take 30 mg by mouth at bedtime.    [provider]  OPSUMIT 10 MG tablet Take 1 tablet (10 mg total) by mouth daily. 02/21/21   Larey Dresser, MD  Riociguat (ADEMPAS) 2.5 MG TABS Take 2.5 mg by mouth in the morning, at noon, and at bedtime. 07/24/21   Larey Dresser, MD  traMADol-acetaminophen (ULTRACET) 37.5-325 MG tablet Take 2 tablets by mouth 4 (four) times daily as needed for pain. 05/07/18   [provider]  Treprostinil Diolamine  ER 0.125 MG TBCR Take 1 tablet by mouth in the morning, at noon, and at bedtime.    [provider]      Allergies    Sulfa antibiotics    Review of Systems   Review of Systems  Constitutional:  Positive for activity change, appetite change and fever.  HENT:  Negative for congestion, rhinorrhea and sore throat.    Physical Exam Updated Vital Signs BP (!) 109/36    Pulse 87    Temp 98.4 F (36.9 C) (Oral)    Resp 13    SpO2 94%  Physical Exam Vitals and nursing Erickson reviewed.  Constitutional:      General: She is in acute distress.     Appearance: She is ill-appearing. She is not toxic-appearing  or diaphoretic.     Comments: Frail, thin-appearing, elderly woman in respiratory distress  HENT:     Head: Normocephalic and atraumatic.     Nose: Nose normal. No congestion or rhinorrhea.     Mouth/Throat:     Mouth: Mucous membranes are moist.     Pharynx: Oropharynx is clear. No oropharyngeal exudate or posterior oropharyngeal erythema.  Eyes:     Conjunctiva/sclera: Conjunctivae normal.     Pupils: Pupils are equal, round, and reactive to light.  Cardiovascular:     Rate and Rhythm: Normal rate and regular rhythm.     Pulses: Normal pulses.     Heart sounds: Normal heart sounds.  Pulmonary:     Effort: Respiratory distress present.     Breath sounds: Rales present. No wheezing or rhonchi.     Comments: Rales present in left lung base, requiring 6L NRB Abdominal:     General: Abdomen is flat. Bowel sounds are normal. There is distension.     Palpations: Abdomen is soft.     Tenderness: There is no abdominal tenderness. There is no guarding or rebound.  Musculoskeletal:        General: No swelling.     Right lower leg: No edema.     Left lower leg: No edema.  Lymphadenopathy:     Cervical: No cervical adenopathy.  Skin:    General: Skin is warm and dry.     Capillary Refill: Capillary refill takes less than 2 seconds.     Coloration: Skin is pale.  Neurological:     General: No focal deficit present.     Mental Status: She is alert and oriented to person, place, and time. Mental status is at baseline.  Psychiatric:        Mood and Affect: Mood normal.        Behavior: Behavior normal.    ED Results / Procedures / Treatments   Labs (all labs ordered are listed, but only abnormal results are displayed) Labs Reviewed  COMPREHENSIVE METABOLIC PANEL - Abnormal; Notable for the following components:      Result Value   Sodium 134 (*)    Chloride 97 (*)    Glucose, Bld 145 (*)    BUN 66 (*)    Creatinine, Ser 1.67 (*)    Calcium 8.4 (*)    Total Protein 5.8 (*)     Albumin 2.6 (*)    Total Bilirubin 0.2 (*)    GFR, Estimated 29 (*)    All other components within normal limits  CBC WITH DIFFERENTIAL/PLATELET - Abnormal; Notable for the following components:   RBC 3.08 (*)    Hemoglobin 7.6 (*)    HCT 27.5 (*)  MCH 24.7 (*)    MCHC 27.6 (*)    RDW 17.0 (*)    All other components within normal limits  BRAIN NATRIURETIC PEPTIDE - Abnormal; Notable for the following components:   B Natriuretic Peptide 695.1 (*)    All other components within normal limits  RESP PANEL BY RT-PCR (FLU A&B, COVID) ARPGX2  CULTURE, BLOOD (ROUTINE X 2)  CULTURE, BLOOD (ROUTINE X 2)  RESPIRATORY PANEL BY PCR  LACTIC ACID, PLASMA  PROCALCITONIN    EKG EKG Interpretation  Date/Time:  Monday September 24 2021 12:47:55 EST Ventricular Rate:  82 PR Interval:  171 QRS Duration: 143 QT Interval:  379 QTC Calculation: 443 R Axis:   95 Text Interpretation: Sinus rhythm Right bundle branch block ST depression, consider ischemia, diffuse lds No significant change since last tracing Confirmed by Blanchie Dessert ([id number]) on 09/24/2021 1:17:31 PM  Radiology DG Chest Port 1 View  Result Date: 09/24/2021 CLINICAL DATA:  Shortness of breath EXAM: PORTABLE CHEST 1 VIEW COMPARISON:  09/14/2021 FINDINGS: Stable cardiomegaly. Aortic atherosclerosis. Patchy right lower lobe airspace opacity. Small right pleural effusion. Left lung base appears clear. No pneumothorax. Bony demineralization. IMPRESSION: Patchy right lower lobe airspace opacity and small right pleural effusion. Findings most suggestive of pneumonia. Electronically Signed   By: Davina Poke D.O.   On: 09/24/2021 14:35    Procedures Procedures    Medications Ordered in ED Medications  furosemide (LASIX) injection 20 mg (has no administration in time range)  azithromycin (ZITHROMAX) 500 mg in sodium chloride 0.9 % 250 mL IVPB (has no administration in time range)  cefTRIAXone (ROCEPHIN) 1 g in sodium chloride 0.9  % 100 mL IVPB (has no administration in time range)    ED Course/ Medical Decision Making/ A&P Clinical Course as of 09/24/21 1518  Mon Sep 24, 2021  1428 Creatinine(!): 1.67 [CM]  1429 Patient had another desat to 82% on 6L NRB, will now place patient on HFNC. [CM]  [place] 1 View [CM]  [phone] Suspect patient is dry due to decreased PO intake this week, now with likely AKI given 1 month ago s cr was 0.99 and today is 1.6, also with hyponatremia and hypochloremia. CXR shows florid pulmonary edema. Given picture of pulmonary TN, suspect patient has CHF due to pulmonary hypertension. This is a very notable change since CXR 3 weeks ago. Patient's hgb went from 8.7 to 7.6, no leukocytosis, no fever on exam. Pneumonia is a consideration, but given vital signs and overall picture, do not suspect infection as leading diagnosis. Highly considering cardio-renal syndrome given the above. However, will treat with azithromycin, ceftriaxone, and lasix. Will call cardiology for consult, hospitalist for admission.  [CM]  0539 Cardiology consult placed. Lasix 20 mg IV ordered. [CM]  7673 Discussed patient with [facility], who will admit patient. [CM]    Clinical Course User Index [CM] Gladys Damme, MD                           Medical Decision Making 88 yo woman with significant PMH of pulmonary hypertension presents in respiratory distress, requiring up to 15L NRB to maintain oxygen saturations >92%. Now patient is stable on 6L NRB. Leading dx is pna given physical exam, also consider CHF/worsening of pulm htn. PE is also possibility, patient is not on any anticoagulation. Will obtain BMP, metabolic panel, CBC, CXR for initial work up.  Amount and/or Complexity of Data Reviewed Independent Historian: caregiver  Details: daughter Labs: ordered. Decision-making details documented in ED Course. Radiology: ordered. Decision-making details documented in ED Course. ECG/medicine tests: ordered.  Decision-making details documented in ED Course.  Risk Prescription drug management.          Final Clinical Impression(s) / ED Diagnoses Final diagnoses:  None    Rx / DC Orders ED Discharge Orders     None         Gladys Damme, MD 09/24/21 1165    Blanchie Dessert, MD 09/24/21 [id number]

## 2021-09-24 NOTE — Assessment & Plan Note (Signed)
Well controlled, continue coreg

## 2021-09-24 NOTE — ED Notes (Signed)
Pt SpO2 100% on NRB. Pt placed on 4L via [place]. Pt breathing without difficulty. Pt began to desat to 87% on 4L, pt continued to breathe without difficulty. Pt placed on 6L via [place] with no improvement in SpO2. Pt placed on 15L via NRB. SpO2 92% and improving on NRB.

## 2021-09-24 NOTE — Assessment & Plan Note (Addendum)
a1c in 2020 was 7.3 -CBG stable, continue sliding scale -Follow-up in outpatient setting as CBGs have been ranging from 126-187

## 2021-09-24 NOTE — Assessment & Plan Note (Addendum)
-  Given gentle IV fluids on admission, creatinine down to 1.1 and further improved to 0.97 -Monitor with diuresis

## 2021-09-24 NOTE — Assessment & Plan Note (Deleted)
Heart failure team following Continue treprostinil, riociguat and opsumit

## 2021-09-24 NOTE — Assessment & Plan Note (Addendum)
Has chronic hypoxic respiratory failure secondary to ILD and severe PAH on 4-5L O2 at baseline -Concern for progressive ILD based on recent CT 2/10,  -Repeat CT chest 2/21 with evidence of basilar infiltrates, consistent with pneumonia, continue IV ceftriaxone and azithromycin-day 3, history of known esophageal stricture, no symptoms of dysphagia at this time, SLP evaluation was unremarkable, switch to p.o. ABX tomorrow and pulmonary recommends no further antibiotics it is completed her course -Slowly making progress, O2 weaned down to 4-5L yesterday morning and went back to her baseline already -However I do think she has considerable disease burden, with advanced age, CHF, severe PAH, ILD, now pneumonia -Will request palliative care evaluation for goals of care and they had a goals of care conversation patient will go home with outpatient follow-up palliative

## 2021-09-24 NOTE — Assessment & Plan Note (Deleted)
Acute on chronic diastolic CHF Severe PAH -IV Lasix today

## 2021-09-24 NOTE — Assessment & Plan Note (Addendum)
S/p successful atherectomy with bifurcation stenting of proximal LAD and D2 05/15/18.Repeat LHC in 3/20 showed nonobstructive disease. -continue ASA and lipitor -plavix held due to anemia but okay to resume given that anemia is stable.

## 2021-09-24 NOTE — ED Provider Notes (Incomplete)
Patient is an elderly female with multiple medical problems who is on 4 L of oxygen chronically due to end-stage pulmonary hypertension who is presenting today with worsening shortness of breath over the last week.  Today she became in distress and was satting 87% on her home oxygen when EMS arrived.  She required nonrebreather to get her oxygen saturation above 90.  She has not had significant cough, sputum production or fever.  She has no localized pain at this time or evidence of volume overload except for fine crackles in the lower lobes bilaterally.  Concern for infectious etiology versus pulmonary edema.  Lower suspicion for PE however patient does not take anticoagulation and does have moderate Wells criteria.  She is currently on 6 L satting in the low 90s.  She was able to be weaned off the nonrebreather.

## 2021-09-24 NOTE — Assessment & Plan Note (Addendum)
-  seen by rheumatology and pulm, progressive based on recent CT 2/10 but pulmonary feels that this is just secondary to pneumonia -? CREST, but was told by rheumatologist didn't think so per patient -Appreciate pulmonary input, started on prednisone and is now being tapered per [facility] -Follow-up with pulm in outpatient setting and she is completed antibiotics

## 2021-09-24 NOTE — Assessment & Plan Note (Signed)
Continue remeron and xanax prn

## 2021-09-24 NOTE — Consult Note (Addendum)
Advanced Heart Failure Team Consult Note   Primary Physician: [name], PA-C PCP-Cardiologist:  Jenkins Rouge, MD  Reason for Consultation: chronic diastolic CHF/pulmonary hypertension  HPI:    [name] is seen today for evaluation of pulmonary hypertension/chronic diastolic CHF at the request of Dr. Maryan Rued with EM. 88 y.o. female with history of DM, HTN, and prior osteomyelitis of jaw, pulmonary hypertension/RV failure.    She reports an episode of PNA in 3/19 and says that she never recovered from this.  She had gradually progressive exertional dyspnea to the point where she was short of breath walking around the house.  She had an echo in 7/19 that showed mildly decreased LV systolic function but severely decreased RV systolic function with dilated RV and at least moderate pulmonary hypertension by echo estimation.  She saw her PCP in 8/19 and was noted to be hypoxemic at rest.  He started her on home oxygen 2 L/[place] which she continues.     She was referred to cardiology, saw Dr. Johnsie Cancel. Amlodipine was stopped and she was started empirically on sildenafil 20 mg tid for pulmonary hypertension.   She was referred to CHF clinic.     RHC/LHC was done in 10/19, showing complex severe bifurcation lesion involving the proximal LAD and large D2 ostium. She had complex PCI requiring atherectomy and DES to pLAD and ostial D2.  RHC showed moderate PAH with PVR 11.6 WU and CI 1.7.  She subsequently was found to have a right groin pseudoaneurysm from cath.    She was started on Opsumit.  Serologies have suggested CREST syndrome.  However, she saw Dr. Amil Amen for rheumatology who told her that he did not think that she had a rheumatologic diagnosis (per her report).    Cardiolite was abnormal in 3/20, this was followed by [facility] in 3/20 that showed nonobstructive CAD.  Echo in 3/20 showed EF 55-60%, with moderately decreased RV systolic function, PASP 64 mmHg.    Echo (3/21) showed EF  55-60%, moderately dilated RV with moderately decreased systolic function, PASP 38 mmHg. Echo in 5/22 showed EF 55-60%, basal inferior akinesis, mildly dilated RV with moderately decreased systolic function, D-shaped septum.   Last seen by Dr. Aundra Dubin 08/24/21. Home O2 need up to 4.5L. Appeared volume up. Lasix increased for 2 days. She was referred to Pulmonary d/t increasing hypoxemia and established with Dr. Chase Caller. High resolution chest CT 02/12 showed progressive ILD. Heterogenous thyroid with multiple nodules incidentally noted.  Presented to ED via EMS with worsening weakness and dyspnea for several days. Her daughter noted she was intermittently hypoxic with O2 sats into the 80s. Denies cough and fevers. No orthopnea or PND. Reports frequent reflux symptoms after eating and has hx hiatal hernia. O2 84% on home O2 4L. NRB placed by EMS. Able to wean down to 12L HFNC. CXR consistent with RLL PNA and small right effusion. Labs significant for Scr 1.67 (baseline 1-1.2), BNP 695, lactic acid 0.5. Started on abx with rocephin and zithromax. Also given 20 mg lasix IV. Admitted to [facility] for management of acute on chronic respiratory failure with hypoxia and pneumonia.   Review of Systems: [y] = yes, _0  = no   General: Weight gain _1 ; Weight loss _2 ; Anorexia _3 ; Fatigue [Y]; Fever _4 ; Chills _5 ; Weakness [ Y]  Cardiac: Chest pain/pressure _6 ; Resting SOB [Y]; Exertional SOB [Y]; Orthopnea _7 ; Pedal Edema _8 ; Palpitations _9 ; Syncope _10 ;  Presyncope _0 ; Paroxysmal nocturnal dyspnea_1   Pulmonary: Cough _2 ; Wheezing_3 ; Hemoptysis_4 ; Sputum _5 ; Snoring _6   GI: Vomiting_7 ; Dysphagia_8 ; Melena_9 ; Hematochezia _10 ; Heartburn_11 ; Abdominal pain _12 ; Constipation _13 ; Diarrhea _14 ; BRBPR _15   GU: Hematuria_16 ; Dysuria _17 ; Nocturia_18   Vascular: Pain in legs with walking _19 ; Pain in feet with lying flat _20 ; Non-healing sores _21 ; Stroke _22 ; TIA _23 ; Slurred speech _24 ;  Neuro: Headaches[  ]; Vertigo_25 ; Seizures_26 ; Paresthesias_27 ;Blurred vision _28 ; Diplopia _29 ; Vision changes _30   Ortho/Skin: Arthritis _31 ; Joint pain _32 ; Muscle pain _33 ; Joint swelling _34 ; Back Pain _35 ; Rash _36   Psych: Depression_37 ; Anxiety_38   Heme: Bleeding problems _39 ; Clotting disorders _40 ; Anemia _41   Endocrine: Diabetes _42 ; Thyroid dysfunction_43   Home Medications Prior to Admission medications   Medication Sig Start Date End Date Taking? Authorizing Provider  ALPRAZolam Duanne Moron) 0.5 MG tablet Take 0.5 mg by mouth daily as needed for anxiety.  09/14/19   [provider]  aspirin EC 81 MG tablet Take 1 tablet (81 mg total) by mouth daily. 05/16/18   Bhagat, Crista Luria, PA  atorvastatin (LIPITOR) 80 MG tablet Take 1 tablet (80 mg total) by mouth daily. 12/06/20   Larey Dresser, MD  carvedilol (COREG) 3.125 MG tablet TAKE 1 TABLET BY MOUTH TWICE DAILY WITH A MEAL 09/03/21   Larey Dresser, MD  cyclobenzaprine (FLEXERIL) 10 MG tablet Take 10 mg by mouth in the morning.    [provider]  cycloSPORINE (RESTASIS) 0.05 % ophthalmic emulsion SMARTSIG:In Eye(s) 08/07/21   [provider]  Epoetin Alfa-epbx (RETACRIT IJ) Inject 1 Dose as directed every 21 ( twenty-one) days.    [provider]  FEROSUL 325 (65 Fe) MG tablet Take 325 mg by mouth daily. 07/04/21   [provider]  furosemide (LASIX) 40 MG tablet Take 1 tablet (40 mg total) by mouth 2 (two) times daily. 08/24/21   Larey Dresser, MD  gabapentin (NEURONTIN) 300 MG capsule Take 300 mg by mouth as needed.    [provider]  lactose free nutrition (BOOST) LIQD Take 237 mLs by mouth 2 (two) times daily between meals.     [provider]  mirtazapine (REMERON) 30 MG tablet Take 30 mg by mouth at bedtime.    [provider]  OPSUMIT 10 MG tablet Take 1 tablet (10 mg total) by mouth daily. 02/21/21   Larey Dresser, MD  Riociguat (ADEMPAS) 2.5 MG TABS Take 2.5 mg by mouth in  the morning, at noon, and at bedtime. 07/24/21   Larey Dresser, MD  traMADol-acetaminophen (ULTRACET) 37.5-325 MG tablet Take 2 tablets by mouth 4 (four) times daily as needed for pain. 05/07/18   [provider]  Treprostinil Diolamine ER 0.125 MG TBCR Take 1 tablet by mouth in the morning, at noon, and at bedtime.    [provider]    Past Medical History: Past Medical History:  Diagnosis Date   Anemia    Anxiety    Arthritis    osteo; "mostly hands, knees, probably back too" (12/09/2016)   Chronic lower back pain    Complication of anesthesia 2011   resp distress -on vent after surgery   Coronary artery disease    Depression    Diabetes ([place])  Esophageal stricture    GERD (gastroesophageal reflux disease)    Headache    out grew them   High cholesterol    History of blood transfusion 2011; ?03/2012   "related to ORs" (12/09/2016)   Hypertension    Intestinal obstruction ([place])    Macular degeneration of left eye    dx over 55 yrs ago.....hasn't changed much   Shingles    Squamous carcinoma 2013   squamas cell on scalp--took 14 radiation tx--2013   Stroke [facility] 1967   Mini stroke;  No lasting deficits   Type II diabetes mellitus ([place]) dx'd 11/2016    Past Surgical History: Past Surgical History:  Procedure Laterality Date   ABDOMINAL HYSTERECTOMY     APPENDECTOMY     BACK SURGERY     BALLOON DILATION N/A 06/24/2020   Procedure: BALLOON DILATION;  Surgeon: Clarene Essex, MD;  Location: [place];  Service: Endoscopy;  Laterality: N/A;   CATARACT EXTRACTION, BILATERAL Bilateral    CHOLECYSTECTOMY     COLON SURGERY     COLONOSCOPY     CORONARY ATHERECTOMY N/A 05/15/2018   Procedure: CORONARY ATHERECTOMY;  Surgeon: Leonie Man, MD;  Location: [place] CV [facility];  Service: Cardiovascular;  Laterality: N/A;   CORONARY STENT INTERVENTION N/A 05/15/2018   Procedure: CORONARY STENT INTERVENTION;  Surgeon: Leonie Man, MD;  Location: [place]  CV LAB;  Service: Cardiovascular;  Laterality: N/A;   ESOPHAGOGASTRODUODENOSCOPY N/A 06/24/2020   Procedure: ESOPHAGOGASTRODUODENOSCOPY (EGD);  Surgeon: Clarene Essex, MD;  Location: [place];  Service: Endoscopy;  Laterality: N/A;   ESOPHAGOGASTRODUODENOSCOPY (EGD) WITH ESOPHAGEAL DILATION  "several times"   ESOPHAGOGASTRODUODENOSCOPY (EGD) WITH PROPOFOL N/A 12/31/2016   Procedure: ESOPHAGOGASTRODUODENOSCOPY (EGD) WITH PROPOFOL;  Surgeon: Wilford Corner, MD;  Location: [place];  Service: Endoscopy;  Laterality: N/A;   EYE SURGERY     FRACTURE SURGERY     IR FLUORO GUIDE CV LINE RIGHT  01/02/2017   IR US GUIDE VASC ACCESS RIGHT  01/02/2017   JOINT REPLACEMENT     LEFT HEART CATH AND CORONARY ANGIOGRAPHY N/A 10/09/2018   Procedure: LEFT HEART CATH AND CORONARY ANGIOGRAPHY;  Surgeon: Larey Dresser, MD;  Location: [place] CV LAB;  Service: Cardiovascular;  Laterality: N/A;   LUMBAR DISC SURGERY  10/25/2014   Right L4-L5 removal of seven free fragments of disk mostly posterolaterally.  Lysis of adhesion.  Microscope/notes 10/26/2014   LUMBAR LAMINECTOMY/DECOMPRESSION MICRODISCECTOMY Right 08/09/2014   Procedure: Right Lumbar Four to Five Microdiskectomy;  Surgeon: Floyce Stakes, MD;  Location: [facility];  Service: Neurosurgery;  Laterality: Right;  Right L4-5 Microdiskectomy   ORIF PERIPROSTHETIC FRACTURE  03/31/2012   Procedure: OPEN REDUCTION INTERNAL FIXATION (ORIF) PERIPROSTHETIC FRACTURE;  Surgeon: Mauri Pole, MD;  Location: [facility];  Service: Orthopedics;  Laterality: Left;  Open reduction internal fixation Left distal femur periprosthetic fracture   RIGHT/LEFT HEART CATH AND CORONARY ANGIOGRAPHY N/A 05/14/2018   Procedure: RIGHT/LEFT HEART CATH AND CORONARY ANGIOGRAPHY;  Surgeon: Larey Dresser, MD;  Location: [place] CV LAB;  Service: Cardiovascular;  Laterality: N/A;   SMALL INTESTINE SURGERY  2011   "really bad blockage; went into respiratory distress and was on  ventilator after surgery; [facility]"   SQUAMOUS CELL CARCINOMA EXCISION  ~ 2012   S/P "cut off her head then 15 radiation txs"    TOTAL KNEE ARTHROPLASTY Bilateral    bilateral    Family History: No family history on file.  Social History: Social History  Socioeconomic History   Marital status: Widowed    Spouse name: Not on file   Number of children: Not on file   Years of education: Not on file   Highest education level: Not on file  Occupational History   Not on file  Tobacco Use   Smoking status: Former    Packs/day: 0.10    Years: 10.00    Pack years: 1.00    Types: Cigarettes    Quit date: 03/31/1991    Years since quitting: 30.5   Smokeless tobacco: Never  Vaping Use   Vaping Use: Never used  Substance and Sexual Activity   Alcohol use: No   Drug use: No   Sexual activity: Never  Other Topics Concern   Not on file  Social History Narrative   Not on file   Social Determinants of Health   Financial Resource Strain: Not on file  Food Insecurity: Not on file  Transportation Needs: Not on file  Physical Activity: Not on file  Stress: Not on file  Social Connections: Not on file    Allergies:  Allergies  Allergen Reactions   Sulfa Antibiotics Swelling    Mouth and tongue swelling    Objective:    Vital Signs:   Temp:  [98.4 F (36.9 C)] 98.4 F (36.9 C) (02/20 1248) Pulse Rate:  [81-85] 83 (02/20 1430) Resp:  [11-23] 14 (02/20 1430) BP: (117-126)/(43-66) 119/66 (02/20 1430) SpO2:  [92 %-98 %] 93 % (02/20 1432)    Weight change: There were no vitals filed for this visit.  Intake/Output:  No intake or output data in the 24 hours ending 09/24/21 1506    Physical Exam    General:  Elderly female, O2 sats stable on 12L high flow [place] HEENT: normal Neck: supple. JVP elevated d/t TR. Carotids 2+ bilat; no bruits.  Cor: PMI nondisplaced. Regular rate & rhythm. No rubs, gallops, + TR murmur Lungs: bibasilar crackles Abdomen: soft,  nontender, nondistended.  Extremities: no cyanosis, clubbing, rash, edema Neuro: alert & orientedx3, cranial nerves grossly intact. moves all 4 extremities w/o difficulty. Affect pleasant   EKG    SR 82 bpm, RBBB  Labs   Basic Metabolic Panel: Recent Labs  Lab 09/24/21 1344  NA 134*  K 4.7  CL 97*  CO2 27  GLUCOSE 145*  BUN 66*  CREATININE 1.67*  CALCIUM 8.4*    Liver Function Tests: Recent Labs  Lab 09/24/21 1344  AST 21  ALT 30  ALKPHOS 46  BILITOT 0.2*  PROT 5.8*  ALBUMIN 2.6*   No results for input(s): LIPASE, AMYLASE in the last 168 hours. No results for input(s): AMMONIA in the last 168 hours.  CBC: Recent Labs  Lab 09/24/21 1344  WBC 7.6  NEUTROABS 4.9  HGB 7.6*  HCT 27.5*  MCV 89.3  PLT 378    Cardiac Enzymes: No results for input(s): CKTOTAL, CKMB, CKMBINDEX, TROPONINI in the last 168 hours.  BNP: BNP (last 3 results) Recent Labs    12/29/20 1438 08/24/21 1049 09/24/21 1344  BNP 358.7* 280.8* 695.1*    ProBNP (last 3 results) No results for input(s): PROBNP in the last 8760 hours.   CBG: No results for input(s): GLUCAP in the last 168 hours.  Coagulation Studies: No results for input(s): LABPROT, INR in the last 72 hours.   Imaging   DG Chest Port 1 View  Result Date: 09/24/2021 CLINICAL DATA:  Shortness of breath EXAM: PORTABLE CHEST 1 VIEW COMPARISON:  09/14/2021 FINDINGS:  Stable cardiomegaly. Aortic atherosclerosis. Patchy right lower lobe airspace opacity. Small right pleural effusion. Left lung base appears clear. No pneumothorax. Bony demineralization. IMPRESSION: Patchy right lower lobe airspace opacity and small right pleural effusion. Findings most suggestive of pneumonia. Electronically Signed   By: Davina Poke D.O.   On: 09/24/2021 14:35     Medications:     Current Medications:  furosemide  20 mg Intravenous Once    Infusions:  azithromycin     cefTRIAXone (ROCEPHIN)  IV        Assessment/Plan   Chronic diastolic CHF: -EF 58-85% on echo 05/22. RV moderately reduced -Volume looks okay today. + JVD but likely d/t TR. -Received 20 mg lasix IV today. Would hold off on additional diuresis with AKI. -Agree with gentle hydration with NS 50-75/hr for 500 cc volume  2. Pulmonary hypertension: She appeared to have at least moderate pulmonary hypertension by echo in 7/19 with RV failure. CT chest in 2019 showed mild emphysema, which should not explain her degree of RV failure.  V/Q scan did not show evidence for chronic PEs. Anti-centromere antibody, ANA, and RF were all positive.  ?CREST variant of scleroderma.  She is on home oxygen.  [name] 05/15/18 showed moderate PAH but very high PVR and low cardiac output.  This was concerning for advanced pulmonary hypertension.  Repeat echo in 3/21 showed moderately dilated/dysfunctional RV but PASP estimate was lower.  Echo in 5/22 showed mildly dilated/moderately dysfunctional RV, TR doppler jet not complete so probably not accurate PA pressure estimate.   - Recent chest CT 02/23 suggestive of progressive ILD. ? Scleroderma related ILD. Now follows with Pulmonary  - She did not tolerate Uptravi.   - Continue riociguat, now on goal dose.  - Continue Opsumit 10 mg daily.  - Continue orenitram, she is unable to titrate any higher due to GI side effects.   - Refused sleep study.  3. Acute on chronic respiratory failure with hypoxia: -On 4L O2 at home, currently 12L HFNC -Increasing O2 needs likely d/t PNA  4. RLL PNA -Abx per TRH -? Aspiration with reports of reflux and hiatal hernia. Discussed swallow eval with [facility]  5. AKI on CKD -Scr 0.99>1.67 (baseline 1-1.2) -Hold diuretics. Gentle hydration as above.  6. CAD: Coronary angiography showed complex bifurcation lesion with 95% stenosis proximal LAD with calcification at take-off of large D2. The ostial/proximal D2 also had 95% calcified stenosis. Suspect this lesion played a role in her exertional  dyspnea and also in her mildly decreased LV systolic function (EF 02-77% on echo in 7/19).  S/p successful atherectomy with bifurcation stenting of proximal LAD and D2 05/15/18.  Repeat LHC in 3/20 showed nonobstructive disease.  No chest pain.  - Continue ASA 81.  - Plavix stopped with anemia.   - Continue atorvastatin  7. ?CREST syndrome: Patient saw [facility]. Per her report, they did not think that she had CREST syndrome.     8. Anemia: Fe deficiency.   -No overt GI bleeding, per her report FOBT was negative.  She is being followed by hematology and is on erythropoeitin.   -It is possible that the anemia comes from Opsumit or Adempas.  However, she has improved symptomatically with these medications.  Would like to continue unless hematologist feels strongly about trying her off of one of them.   -Hgb 7.6 today (baseline 8s-10s), follow closely. Transfuse Hgb < 7.    Length of Stay: 0  FINCH, LINDSAY N, PA-C  09/24/2021,  3:06 PM  Advanced Heart Failure Team Pager [phone] (M-F; 7a - 5p)  Please contact [facility] for night-coverage after hours (4p -7a ) and weekends on [email or web address]   Patient seen and examined with the above-signed Advanced Practice Provider and/or Housestaff. I personally reviewed laboratory data, imaging studies and relevant notes. I independently examined the patient and formulated the important aspects of the plan. I have edited the note to reflect any of my changes or salient points. I have personally discussed the plan with the patient and/or family.  88 y/o woman with possible scleroderma/CREST with ILD and PAH, chronic diastolic HF,hiatal hernia and CAD.   Brought to ER by daughter for worsening fatigue and hypoxia. Sats low 80s on 4L. Denies fevers, chills, cough.   Labs with WBC 7.6 hgb 7.6 BNP 280 -> 695. CXR with large RLL PNA. Mild AKI  General:  Elderly weak appearing. No resp difficulty HEENT: normal Neck: supple. JVP to jaw with  prominent v waves Carotids 2+ bilat; no bruits. No lymphadenopathy or thryomegaly appreciated. Cor: PMI nondisplaced. Regular rate & rhythm. 2/6 TR Lungs: crackles 1/2 up bilaterally  Abdomen: soft, nontender, nondistended. No hepatosplenomegaly. No bruits or masses. Good bowel sounds. Extremities: no cyanosis, clubbing, rash, edema Neuro: alert & orientedx3, cranial nerves grossly intact. moves all 4 extremities w/o difficulty. Affect pleasant  Despite lack of infectious symptoms, suspect main issue is large RLL PNA. Agree with broad spectrum abx. Check PCT. Can consider chest CT as needed. Appears dry. Would hydrate very gently with 50cc/hr for total of 500cc. Consider swallow study to exclude aspiration.   She is DNR/DNI.   Glori Bickers, MD  5:18 PM

## 2021-09-24 NOTE — H&P (Addendum)
History and Physical    Patient: [name] DOB: 1932/06/05 DOA: 09/24/2021 DOS: the patient was seen and examined on 09/24/2021 PCP: [name], PA-C  Patient coming from: Home - lives alone, daughter lives right by her.    Chief Complaint: weakness and respiratory distress   HPI: [name] is a 88 y.o. female with medical history significant of pulmonary HTN, chronic respiratory failure on 4L oxygen, ILD, CAD, esophageal stricture, GERD, HLD, hx of stroke, T2DM who presented to ED in respiratory distress. Has had complaints of weakness x 1 week. Her daughter is at bedside. Over the past week she has been weaker and more hypoxic. She states she has been running around 82-88% over the last week on 5L [place]. She saw the pulmonologist on 2/7 they stated her oxygen had been going into the 70s for about 3 weeks. After that appointment she seemed to be better until about one week ago.   Her daughter thinks she had a sweating episode last week, but no recorded fevers, no headaches/dizziness, no chest pain or palpitations, no cough and doesn't complain of shortness of breath, no abdominal pain, no recent N/V/D, no dysuria, no leg swelling. She has had decreased PO intake over the last week.      ER Course:  vitals: afebrile, bp: 126/49, HR: 83, RR: 20, oxygen 95% on 4L. >15L NRB>12L HFNC Pertinent labs: hgb 7.6, BUN: 66, creatinine: 1.67, BNP: 695,  CXR: patchy right lower lobe airspace opacity and small right pleural effusion, most suggestive of pneumonia.  In ED: started on rocephin and azithromax, given 23m of lasix.  Cardiology consulted and TR[facility] asked to admit.    Review of Systems: As mentioned in the history of present illness. All other systems reviewed and are negative. Past Medical History:  Diagnosis Date   Anemia    Anxiety    Arthritis    osteo; "mostly hands, knees, probably back too" (12/09/2016)   Chronic lower back pain    Complication of anesthesia 2011    resp distress -on vent after surgery   Coronary artery disease    Depression    Diabetes ([place]    Esophageal stricture    GERD (gastroesophageal reflux disease)    Headache    out grew them   High cholesterol    History of blood transfusion 2011; ?03/2012   "related to ORs" (12/09/2016)   Hypertension    Intestinal obstruction (HCC)    Macular degeneration of left eye    dx over 55 yrs ago.....hasn't changed much   Shingles    Squamous carcinoma 2013   squamas cell on scalp--took 14 radiation tx--2013   Stroke ([facility] 1967   Mini stroke;  No lasting deficits   Type II diabetes mellitus ([facility] dx'd 11/2016   Past Surgical History:  Procedure Laterality Date   ABDOMINAL HYSTERECTOMY     APPENDECTOMY     BACK SURGERY     BALLOON DILATION N/A 06/24/2020   Procedure: BALLOON DILATION;  Surgeon: MClarene Essex MD;  [facility]  Service: Endoscopy;  Laterality: N/A;   CATARACT EXTRACTION, BILATERAL Bilateral    CHOLECYSTECTOMY     COLON SURGERY     COLONOSCOPY     CORONARY ATHERECTOMY N/A 05/15/2018   Procedure: CORONARY ATHERECTOMY;  Surgeon: HLeonie Man MD;  [facility];  Service: Cardiovascular;  Laterality: N/A;   CORONARY STENT INTERVENTION N/A 05/15/2018   Procedure: CORONARY STENT INTERVENTION;  Surgeon: HGlenetta Hew  W, MD;  Location: [facility];  Service: Cardiovascular;  Laterality: N/A;   ESOPHAGOGASTRODUODENOSCOPY N/A 06/24/2020   Procedure: ESOPHAGOGASTRODUODENOSCOPY (EGD);  Surgeon: Clarene Essex, MD;  Location: [facility];  Service: Endoscopy;  Laterality: N/A;   ESOPHAGOGASTRODUODENOSCOPY (EGD) WITH ESOPHAGEAL DILATION  "several times"   ESOPHAGOGASTRODUODENOSCOPY (EGD) WITH PROPOFOL N/A 12/31/2016   Procedure: ESOPHAGOGASTRODUODENOSCOPY (EGD) WITH PROPOFOL;  Surgeon: Wilford Corner, MD;  Location: [facility];  Service: Endoscopy;  Laterality: N/A;   EYE SURGERY     FRACTURE SURGERY     IR FLUORO GUIDE CV LINE RIGHT   01/02/2017   IR US GUIDE VASC ACCESS RIGHT  01/02/2017   JOINT REPLACEMENT     LEFT HEART CATH AND CORONARY ANGIOGRAPHY N/A 10/09/2018   Procedure: LEFT HEART CATH AND CORONARY ANGIOGRAPHY;  Surgeon: Larey Dresser, MD;  Location: [facility];  Service: Cardiovascular;  Laterality: N/A;   LUMBAR DISC SURGERY  10/25/2014   Right L4-L5 removal of seven free fragments of disk mostly posterolaterally.  Lysis of adhesion.  Microscope/notes 10/26/2014   LUMBAR LAMINECTOMY/DECOMPRESSION MICRODISCECTOMY Right 08/09/2014   Procedure: Right Lumbar Four to Five Microdiskectomy;  Surgeon: Floyce Stakes, MD;  [facility]: [facility];  Service: Neurosurgery;  Laterality: Right;  Right L4-5 Microdiskectomy   ORIF PERIPROSTHETIC FRACTURE  03/31/2012   Procedure: OPEN REDUCTION INTERNAL FIXATION (ORIF) PERIPROSTHETIC FRACTURE;  Surgeon: Mauri Pole, MD;  Location: [facility];  Service: Orthopedics;  Laterality: Left;  Open reduction internal fixation Left distal femur periprosthetic fracture   RIGHT/LEFT HEART CATH AND CORONARY ANGIOGRAPHY N/A 05/14/2018   Procedure: RIGHT/LEFT HEART CATH AND CORONARY ANGIOGRAPHY;  Surgeon: Larey Dresser, MD;  Location: [facility];  Service: Cardiovascular;  Laterality: N/A;   SMALL INTESTINE SURGERY  2011   "really bad blockage; went into respiratory distress and was on ventilator after surgery; [facility]"   SQUAMOUS CELL CARCINOMA EXCISION  ~ 2012   S/P "cut off her head then 15 radiation txs"    TOTAL KNEE ARTHROPLASTY Bilateral    bilateral   Social History:  reports that she quit smoking about 30 years ago. Her smoking use included cigarettes. She has a 1.00 pack-year smoking history. She has never used smokeless tobacco. She reports that she does not drink alcohol and does not use drugs.  Allergies  Allergen Reactions   Sulfa Antibiotics Swelling    Mouth and tongue swelling    No family history on file.  Prior to Admission medications    Medication Sig Start Date End Date Taking? Authorizing Provider  ALPRAZolam Duanne Moron) 0.5 MG tablet Take 0.5 mg by mouth daily as needed for anxiety.  09/14/19   [provider]  aspirin EC 81 MG tablet Take 1 tablet (81 mg total) by mouth daily. 05/16/18   Bhagat, Crista Luria, PA  atorvastatin (LIPITOR) 80 MG tablet Take 1 tablet (80 mg total) by mouth daily. 12/06/20   Larey Dresser, MD  carvedilol (COREG) 3.125 MG tablet TAKE 1 TABLET BY MOUTH TWICE DAILY WITH A MEAL 09/03/21   Larey Dresser, MD  cyclobenzaprine (FLEXERIL) 10 MG tablet Take 10 mg by mouth in the morning.    [provider]  cycloSPORINE (RESTASIS) 0.05 % ophthalmic emulsion SMARTSIG:In Eye(s) 08/07/21   [provider]  Epoetin Alfa-epbx (RETACRIT IJ) Inject 1 Dose as directed every 21 ( twenty-one) days.    [provider]  FEROSUL 325 (65 Fe) MG tablet Take 325 mg by mouth daily. 07/04/21  [provider]  furosemide (LASIX) 40 MG tablet Take 1 tablet (40 mg total) by mouth 2 (two) times daily. 08/24/21   Larey Dresser, MD  gabapentin (NEURONTIN) 300 MG capsule Take 300 mg by mouth as needed.    [provider]  lactose free nutrition (BOOST) LIQD Take 237 mLs by mouth 2 (two) times daily between meals.     [provider]  mirtazapine (REMERON) 30 MG tablet Take 30 mg by mouth at bedtime.    [provider]  OPSUMIT 10 MG tablet Take 1 tablet (10 mg total) by mouth daily. 02/21/21   Larey Dresser, MD  Riociguat (ADEMPAS) 2.5 MG TABS Take 2.5 mg by mouth in the morning, at noon, and at bedtime. 07/24/21   Larey Dresser, MD  traMADol-acetaminophen (ULTRACET) 37.5-325 MG tablet Take 2 tablets by mouth 4 (four) times daily as needed for pain. 05/07/18   [provider]  Treprostinil Diolamine ER 0.125 MG TBCR Take 1 tablet by mouth in the morning, at noon, and at bedtime.    [provider]    Physical Exam: Vitals:   09/24/21 1432  09/24/21 1445 09/24/21 1500 09/24/21 1600  BP:  (!) 114/41 (!) 109/36 (!) 113/44  Pulse:  86 87 87  Resp:  _0 Temp:    99.1 F (37.3 C)  TempSrc:      SpO2: 93% 92% 94% 95%  Weight:    54.7 kg  Height:    5' 2" (1.575 m)   General:  Appears calm and comfortable and is in NAD Eyes:  PERRL, EOMI, normal lids, iris ENT:  hard of hearing, lips & tongue, mildly dry mm; dentures Neck:  no LAD, masses or thyromegaly; no carotid bruits Cardiovascular:  RRR, quiet murmur, No LE edema.  Respiratory:   bibasilar crackles and velcro sounds, decreased air movement, upper lobes wnl.  Abdomen:  soft, NT, ND, NABS Back:   normal alignment, no CVAT Skin:  no rash or induration seen on limited exam Musculoskeletal:  grossly normal tone BUE/BLE, good ROM, no bony abnormality Lower extremity:  No LE edema.  Limited foot exam with no ulcerations.  2+ distal pulses. Psychiatric:  grossly normal mood and affect, speech fluent and appropriate, AOx3 Neurologic:  CN 2-12 grossly intact, moves all extremities in coordinated fashion, sensation intact   Radiological Exams on Admission: Independently reviewed - see discussion in A/P where applicable  DG Chest Port 1 View  Result Date: 09/24/2021 CLINICAL DATA:  Shortness of breath EXAM: PORTABLE CHEST 1 VIEW COMPARISON:  09/14/2021 FINDINGS: Stable cardiomegaly. Aortic atherosclerosis. Patchy right lower lobe airspace opacity. Small right pleural effusion. Left lung base appears clear. No pneumothorax. Bony demineralization. IMPRESSION: Patchy right lower lobe airspace opacity and small right pleural effusion. Findings most suggestive of pneumonia. Electronically Signed   By: Davina Poke D.O.   On: 09/24/2021 14:35    EKG: Independently reviewed.  NSR with rate 82, RBBB; nonspecific ST changes with no evidence of acute ischemia. T wave depression, but similar to previous ekg.    Labs on Admission: I have personally reviewed the available labs and  imaging studies at the time of the admission.  Pertinent labs:    hgb 7.6,  BUN: 66,  creatinine: 1.67,  BNP: 695,    Assessment and Plan: * Acute on chronic respiratory failure with hypoxia ([place])- (present on admission) 88 year old with hx of ILD/pulmonary HTN on chronic oxygen at 4-5L presenting  with acute on chronic respiratory failure with hypoxia, weakness and dypsnea on exertion -admit to progressive -on HFNC at 12L currently -she has end stage pulmonary HTN/ILD. Palliative care has been consulted. She is a DNR and doesn't want aggressive therapy, okay with bipap  -? If worsening diseases above, also RLL Pneumonia on xray or CHF exacerbation (less suspicious for this as clinically appears dry)  -told cardiology she has some reflux/hiatal hernia, ? Aspiration. Aspiration precautions and SLP eval ordered. NPO.  -treating possible pneumonia, see below -cardiology consulted, will leave diuresis up to them, but clinically she looks dry and has an AKI likely from increased lasix and poor PO intake. Will do 50cc/hour x10 hours after discussing with cardiology   Acute renal failure superimposed on stage 3b chronic kidney disease ([place]) Baseline creatinine appears to be around 1.4, but don't have much to go on, was .99 two weeks ago Appear volume down -received lasix 42m in ED, hold on anymore diuresis Strict I/O Hold nephrotoxic drugs Check UA and urine studies Gentle, time limited IVF at 50cc/hour x 10 hours   Chronic diastolic CHF (congestive heart failure) ([place] Am not convinced she is in acute CHF exacerbation. BNP likely elevated from pulmonary HTN. Question RLL pneumonia vs worsening pulm HTN vs. ILD progression -given 271mlasix in ED, gets [id number] home -has acute on chronic renal disease -appears more dry to me -hold diuresis in setting of AKI unless cards says otherwise  -echo ordered -strict I/O and daily weights -last echo in 12/2020: is 55 to 60%. The left ventricle has  normal function. The left ventricle demonstrates regional wall motion abnormalities with basal inferior akinesis. There is mild left ventricular hypertrophy. Left ventricular diastolic parameters are consistent with Grade I diastolic dysfunction (impaired relaxation).   pneumonia  CXR with large RLL pnuemonia. History concerning for possible aspiration?  Clinically she has no WBC, cough, or other sepsis criteria, lactic acid wnl -given rocephin and azithromycin in ED, will continue for now -blood cultures ordered -procalcitonin -covid/flu pending and respiratory panel ordered -aspiration precautions and NPO -SLP eval/tx   Anemia in chronic kidney disease- (present on admission) Seen by oncology with negative w/u and only finding of CKD Receives retacrit q 4 weeks, last injection 09/06/21.  Baseline has been 8-9.  Did have a blood transfusion last summer Denies any blood in stool, will repeat fecal occult   Will need to monitor closely, transfuse if hgb <7  Pulmonary hypertension, unspecified ([place](present on admission) Followed by Dr. McLAundra Dubintpatient, they have been consulted to see here in hospital by EDP.  Heart failure team following Continue her treprostinil, riociguat and opsumit  Type 2 diabetes mellitus (HCCWatchung1c in 2020 was 7.3 On no medication SSI and accuchecks per protocol   ILD (interstitial lung disease) ([place]woodseen by rheumatology and pulm -? CREST, but was told by rheumatologist didn't think so per patient -? If could be contributing to #1 -does not want aggressive intervetion  Hypertension- (present on admission) Well controlled, continue coreg   History of CVA (cerebrovascular accident) Continue ASA/statin   CAD/HLD- (present on admission) S/p successful atherectomy with bifurcation stenting of proximal LAD and D2 05/15/18.  Repeat LHC in 3/20 showed nonobstructive disease. -continue ASA and lipitor -plavix held due to anemia   Anxiety and depression-  (present on admission) Continue remeron and xanax prn     Advance Care Planning:   Code Status: DNR   Consults: cardiology: Dr. BenMaryland Pinkd palliative care  DVT Prophylaxis: SCDs  Family Communication: daughter at bedside: [name]  Severity of Illness: The appropriate patient status for this patient is INPATIENT. Inpatient status is judged to be reasonable and necessary in order to provide the required intensity of service to ensure the patient's safety. The patient's presenting symptoms, physical exam findings, and initial radiographic and laboratory data in the context of their chronic comorbidities is felt to place them at high risk for further clinical deterioration. Furthermore, it is not anticipated that the patient will be medically stable for discharge from the hospital within 2 midnights of admission.   * I certify that at the point of admission it is my clinical judgment that the patient will require inpatient hospital care spanning beyond 2 midnights from the point of admission due to high intensity of service, high risk for further deterioration and high frequency of surveillance required.*  Author: Orma Flaming, MD 09/24/2021 5:48 PM  For on call review [email or web address].

## 2021-09-24 NOTE — ED Triage Notes (Addendum)
Pt BIB [facility] from home, pt had a cardiology appt today but felt too weak to go. Pt has a hx of pulmonary htn, CHF. Pt wears 4L O2 via [place] at baseline, SpO2 on 4L was 84%. Pt placed on NRB by EMS at [place] and was 100%. Pt endorses lower back pain. EKG found to have ischemic depression. Pt denies feeling short of breath. Endorses decreased PO intake at home.   EMS VS0 122/47, HR 82, RR 18

## 2021-09-24 NOTE — Assessment & Plan Note (Addendum)
Seen by oncology with negative w/u and only finding of CKD Receives retacrit q 4 weeks, last injection 09/06/21.  Baseline has been 8-9.  -Denies any overt bleeding, transfused 1 unit of PRBC for hemoglobin of 7.2 the day before yesterday, anemia panel with severe iron deficiency, she denies any overt bleeding, start IV iron, not a great candidate for endoscopic evaluation with chronic respiratory failure -Patient's hemoglobin/hematocrit is now stable 9.3/30.9

## 2021-09-24 NOTE — Assessment & Plan Note (Signed)
Continue ASA/statin

## 2021-09-25 ENCOUNTER — Inpatient Hospital Stay (HOSPITAL_COMMUNITY): Payer: Medicare Other

## 2021-09-25 DIAGNOSIS — I5032 Chronic diastolic (congestive) heart failure: Secondary | ICD-10-CM

## 2021-09-25 LAB — BLOOD CULTURE ID PANEL (REFLEXED) - BCID2

## 2021-09-25 LAB — ECHOCARDIOGRAM COMPLETE
AR max vel: 1.91 cm2
AV Peak grad: 14.3 mmHg
Ao pk vel: 1.89 m/s
Area-P 1/2: 3.65 cm2
Calc EF: 58.3 %
Height: 62 in
S' Lateral: 2.62 cm
Single Plane A2C EF: 57.4 %
Single Plane A4C EF: 58 %
Weight: 1929.47 oz

## 2021-09-25 LAB — RESPIRATORY PANEL BY PCR

## 2021-09-25 LAB — BASIC METABOLIC PANEL
Anion gap: 9 (ref 5–15)
BUN: 61 mg/dL — ABNORMAL HIGH (ref 8–23)
CO2: 26 mmol/L (ref 22–32)
Calcium: 8.3 mg/dL — ABNORMAL LOW (ref 8.9–10.3)
Chloride: 103 mmol/L (ref 98–111)
Creatinine, Ser: 1.44 mg/dL — ABNORMAL HIGH (ref 0.44–1.00)
GFR, Estimated: 35 mL/min — ABNORMAL LOW (ref >60)
Glucose, Bld: 101 mg/dL — ABNORMAL HIGH (ref 70–99)
Potassium: 3.9 mmol/L (ref 3.5–5.1)
Sodium: 138 mmol/L (ref 135–145)

## 2021-09-25 LAB — CBC
HCT: 25.4 % — ABNORMAL LOW (ref 36.0–46.0)
Hemoglobin: 7.2 g/dL — ABNORMAL LOW (ref 12.0–15.0)
MCH: 25 pg — ABNORMAL LOW (ref 26.0–34.0)
MCHC: 28.3 g/dL — ABNORMAL LOW (ref 30.0–36.0)
MCV: 88.2 fL (ref 80.0–100.0)
Platelets: 305 10*3/uL (ref 150–400)
RBC: 2.88 MIL/uL — ABNORMAL LOW (ref 3.87–5.11)
RDW: 16.8 % — ABNORMAL HIGH (ref 11.5–15.5)
WBC: 5.8 10*3/uL (ref 4.0–10.5)
nRBC: 0 % (ref 0.0–0.2)

## 2021-09-25 LAB — GLUCOSE, CAPILLARY
Glucose-Capillary: 98 mg/dL (ref 70–99)
Glucose-Capillary: 160 mg/dL — ABNORMAL HIGH (ref 70–99)
Glucose-Capillary: 119 mg/dL — ABNORMAL HIGH (ref 70–99)
Glucose-Capillary: 144 mg/dL — ABNORMAL HIGH (ref 70–99)

## 2021-09-25 LAB — RETICULOCYTES
Immature Retic Fract: 30.8 % — ABNORMAL HIGH (ref 2.3–15.9)
RBC.: 2.86 MIL/uL — ABNORMAL LOW (ref 3.87–5.11)
Retic Count, Absolute: 95.5 10*3/uL (ref 19.0–186.0)
Retic Ct Pct: 3.3 % — ABNORMAL HIGH (ref 0.4–3.1)

## 2021-09-25 LAB — IRON AND TIBC
Iron: 23 ug/dL — ABNORMAL LOW (ref 28–170)
Saturation Ratios: 7 % — ABNORMAL LOW (ref 10.4–31.8)
TIBC: 326 ug/dL (ref 250–450)
UIBC: 303 ug/dL

## 2021-09-25 LAB — FOLATE: Folate: 55.8 ng/mL (ref >5.9)

## 2021-09-25 LAB — PREPARE RBC (CROSSMATCH)

## 2021-09-25 LAB — VITAMIN B12: Vitamin B-12: 1495 pg/mL — ABNORMAL HIGH (ref 180–914)

## 2021-09-25 LAB — PROCALCITONIN: Procalcitonin: <0.1 ng/mL

## 2021-09-25 LAB — FERRITIN: Ferritin: 12 ng/mL (ref 11–307)

## 2021-09-25 MED ORDER — POLYVINYL ALCOHOL 1.4 % OP SOLN
1.0000 [drp] | Freq: Two times a day (BID) | OPHTHALMIC | Status: DC
  Administered 2021-09-25 – 2021-09-28 (×5): 1 [drp] via OPHTHALMIC
  Filled 2021-09-25: qty 15

## 2021-09-25 MED ORDER — FUROSEMIDE 10 MG/ML IJ SOLN
40.0000 mg | Freq: Once | INTRAMUSCULAR | Status: AC
  Administered 2021-09-25: 40 mg via INTRAVENOUS
  Filled 2021-09-25: qty 4

## 2021-09-25 MED ORDER — RIOCIGUAT 2.5 MG PO TABS
2.5000 mg | ORAL_TABLET | Freq: Three times a day (TID) | ORAL | Status: DC
  Administered 2021-09-26 – 2021-09-28 (×7): 2.5 mg via ORAL
  Filled 2021-09-25 (×6): qty 1

## 2021-09-25 MED ORDER — TREPROSTINIL DIOLAMINE ER 0.25 MG PO TBCR
0.7500 mg | EXTENDED_RELEASE_TABLET | Freq: Three times a day (TID) | ORAL | Status: DC
  Administered 2021-09-25 – 2021-09-28 (×9): 0.75 mg via ORAL
  Filled 2021-09-25 (×9): qty 3

## 2021-09-25 MED ORDER — RIOCIGUAT 2.5 MG PO TABS: 2.5000 mg | ORAL_TABLET | Freq: Three times a day (TID) | ORAL | Status: DC

## 2021-09-25 MED ORDER — SODIUM CHLORIDE 0.9% IV SOLUTION: Freq: Once | INTRAVENOUS | Status: DC

## 2021-09-25 MED ORDER — ENOXAPARIN SODIUM 40 MG/0.4ML IJ SOSY
40.0000 mg | PREFILLED_SYRINGE | INTRAMUSCULAR | Status: DC
  Administered 2021-09-25: 40 mg via SUBCUTANEOUS
  Filled 2021-09-25: qty 0.4

## 2021-09-25 MED ORDER — SODIUM CHLORIDE 0.9 % IV SOLN
250.0000 mg | Freq: Every day | INTRAVENOUS | Status: DC
  Filled 2021-09-25: qty 20

## 2021-09-25 MED ORDER — ENOXAPARIN SODIUM 30 MG/0.3ML IJ SOSY
30.0000 mg | PREFILLED_SYRINGE | INTRAMUSCULAR | Status: DC
  Administered 2021-09-26 – 2021-09-28 (×3): 30 mg via SUBCUTANEOUS
  Filled 2021-09-25 (×3): qty 0.3

## 2021-09-25 MED ORDER — FUROSEMIDE 10 MG/ML IJ SOLN
40.0000 mg | Freq: Once | INTRAMUSCULAR | Status: DC
Start: 2021-09-25 — End: 2021-09-25

## 2021-09-25 MED ORDER — TREPROSTINIL DIOLAMINE ER 0.125 MG PO TBCR
0.1250 mg | EXTENDED_RELEASE_TABLET | Freq: Three times a day (TID) | ORAL | Status: DC
  Administered 2021-09-25 – 2021-09-28 (×9): 0.125 mg via ORAL
  Filled 2021-09-25 (×7): qty 1

## 2021-09-25 NOTE — Progress Notes (Addendum)
Advanced Heart Failure Rounding Note  PCP-Cardiologist: Jenkins Rouge, MD   Subjective:    Remains on azithromycin + ceftriaxone. AF. WBC 5.8   PCT <0.10  Blood Cx pending   Respiratory panel negative. TB negative   BNP 695   Hgb 7.2. Anemia panel pending   AKI improving. SCr 1.67>>1.44 (baseline <1.0). Diuretics remain on hold. Wt stable.   She claims she feels "fine" but on high O2 requirements, 15L NFNC w/ O2 sats ~94% (home baseline 4L). Denies cough. No complaints.   Objective:   Weight Range: 54.7 kg Body mass index is 22.06 kg/m.   Vital Signs:   Temp:  [97.1 F (36.2 C)-99.1 F (37.3 C)] 97.6 F (36.4 C) (02/21 0741) Pulse Rate:  [81-87] 83 (02/21 0741) Resp:  [11-23] 13 (02/21 0741) BP: (109-126)/(35-66) 109/63 (02/21 0741) SpO2:  [92 %-98 %] 96 % (02/21 0741) Weight:  [54.7 kg] 54.7 kg (02/21 0500) Last BM Date : 09/22/21  Weight change: Filed Weights   09/24/21 1600 09/25/21 0500  Weight: 54.7 kg 54.7 kg    Intake/Output:   Intake/Output Summary (Last 24 hours) at 09/25/2021 0754 Last data filed at 09/25/2021 0200 Gross per 24 hour  Intake 468.03 ml  Output 500 ml  Net -31.97 ml      Physical Exam    General:  elderly WF on HFNC but no increased WOB, resting comfortably in bed  HEENT: Normal Neck: Supple. JVP to jaw w/ prominent V waves . Carotids 2+ bilat; no bruits. No lymphadenopathy or thyromegaly appreciated. Cor: PMI nondisplaced. Regular rate & rhythm. 2/6 TR murmur  Lungs: decreased BS at the bases, no wheezing  Abdomen: Soft, nontender, nondistended. No hepatosplenomegaly. No bruits or masses. Good bowel sounds. Extremities: No cyanosis, clubbing, rash, edema Neuro: Alert & orientedx3, cranial nerves grossly intact. moves all 4 extremities w/o difficulty. Affect pleasant  Telemetry   NSR 70s   EKG    NSR 70s, occasional PVCs   Labs    CBC Recent Labs    09/24/21 1344 09/24/21 2234 09/25/21 0320  WBC 7.6 6.6 5.8   NEUTROABS 4.9  --   --   HGB 7.6* 7.3* 7.2*  HCT 27.5* 24.9* 25.4*  MCV 89.3 87.4 88.2  PLT 378 314 599   Basic Metabolic Panel Recent Labs    09/24/21 1344 09/24/21 1700 09/25/21 0320  NA 134*  --  138  K 4.7  --  3.9  CL 97*  --  103  CO2 27  --  26  GLUCOSE 145*  --  101*  BUN 66*  --  61*  CREATININE 1.67*  --  1.44*  CALCIUM 8.4*  --  8.3*  MG  --  2.1  --    Liver Function Tests Recent Labs    09/24/21 1344 09/24/21 1743  AST 21 22  ALT 30 31  ALKPHOS 46 47  BILITOT 0.2* 0.2*  PROT 5.8* 6.2*  ALBUMIN 2.6* 2.6*   No results for input(s): LIPASE, AMYLASE in the last 72 hours. Cardiac Enzymes No results for input(s): CKTOTAL, CKMB, CKMBINDEX, TROPONINI in the last 72 hours.  BNP: BNP (last 3 results) Recent Labs    12/29/20 1438 08/24/21 1049 09/24/21 1344  BNP 358.7* 280.8* 695.1*    ProBNP (last 3 results) No results for input(s): PROBNP in the last 8760 hours.   D-Dimer No results for input(s): DDIMER in the last 72 hours. Hemoglobin A1C No results for input(s): HGBA1C in the last  72 hours. Fasting Lipid Panel No results for input(s): CHOL, HDL, LDLCALC, TRIG, CHOLHDL, LDLDIRECT in the last 72 hours. Thyroid Function Tests No results for input(s): TSH, T4TOTAL, T3FREE, THYROIDAB in the last 72 hours.  Invalid input(s): FREET3  Other results:   Imaging    DG Chest Port 1 View  Result Date: 09/24/2021 CLINICAL DATA:  Shortness of breath EXAM: PORTABLE CHEST 1 VIEW COMPARISON:  09/14/2021 FINDINGS: Stable cardiomegaly. Aortic atherosclerosis. Patchy right lower lobe airspace opacity. Small right pleural effusion. Left lung base appears clear. No pneumothorax. Bony demineralization. IMPRESSION: Patchy right lower lobe airspace opacity and small right pleural effusion. Findings most suggestive of pneumonia. Electronically Signed   By: Davina Poke D.O.   On: 09/24/2021 14:35     Medications:     Scheduled Medications:  aspirin EC   81 mg Oral Daily   atorvastatin  80 mg Oral Daily   azithromycin  500 mg Oral QPM   carvedilol  3.125 mg Oral BID WC   cyclobenzaprine  10 mg Oral q AM   cycloSPORINE  1 drop Both Eyes BID   insulin aspart  0-9 Units Subcutaneous TID WC   macitentan  10 mg Oral Daily   mirtazapine  30 mg Oral QHS   sodium chloride flush  3 mL Intravenous Q12H    Infusions:  sodium chloride     cefTRIAXone (ROCEPHIN)  IV      PRN Medications: sodium chloride, acetaminophen **OR** acetaminophen, ALPRAZolam, melatonin, sodium chloride, sodium chloride flush, traMADol-acetaminophen    Patient Profile   88 y/o woman with possible scleroderma/CREST with ILD and PAH, chronic diastolic HF,hiatal hernia and CAD, admitted for a/c hypoxic respiratory failure in the setting of large RLL PNA. Also w/ AKI, felt to be dehydrated.    Assessment/Plan   Chronic diastolic CHF: -EF 32-44% on echo 05/22. RV moderately reduced -Volume looks okay today. + JVD but likely d/t TR. -Received 20 mg lasix IV on admit. Holding additional diuresis with AKI (suspected to be dry) -follow daily wts    2. Pulmonary hypertension: She appeared to have at least moderate pulmonary hypertension by echo in 7/19 with RV failure. CT chest in 2019 showed mild emphysema, which should not explain her degree of RV failure.  V/Q scan did not show evidence for chronic PEs. Anti-centromere antibody, ANA, and RF were all positive.  ?CREST variant of scleroderma.  She is on home oxygen.  [place] 05/15/18 showed moderate PAH but very high PVR and low cardiac output.  This was concerning for advanced pulmonary hypertension.  Repeat echo in 3/21 showed moderately dilated/dysfunctional RV but PASP estimate was lower.  Echo in 5/22 showed mildly dilated/moderately dysfunctional RV, TR doppler jet not complete so probably not accurate PA pressure estimate.   - Recent chest CT 02/23 suggestive of progressive ILD. ? Scleroderma related ILD. Now follows with  Pulmonary  - She did not tolerate Uptravi.   - Continue riociguat, now on goal dose.  - Continue Opsumit 10 mg daily.  - Continue orenitram, she is unable to titrate any higher due to GI side effects.   - Refused sleep study.   3. Acute on chronic respiratory failure with hypoxia: -On 4L O2 at home, currently 15L HFNC -Increasing O2 needs likely d/t PNA -Can consider repeat chest CT as needed -Consider stopping Coreg and switching to cardioselective bisoprolol    4. RLL PNA -Abx per TRH - PCT <0.10  -? Aspiration with reports of reflux and hiatal hernia.  Discussed swallow eval with TR[facility]   5. AKI on CKD -Scr 0.99>1.67 (baseline 1-1.2) -Treated w/ Gentle hydration. Diuretics on hold -Improving, SCr 1.4 today    6. CAD: Coronary angiography showed complex bifurcation lesion with 95% stenosis proximal LAD with calcification at take-off of large D2. The ostial/proximal D2 also had 95% calcified stenosis. Suspect this lesion played a role in her exertional dyspnea and also in her mildly decreased LV systolic function (EF 36-12% on echo in 7/19).  S/p successful atherectomy with bifurcation stenting of proximal LAD and D2 05/15/18.  Repeat LHC in 3/20 showed nonobstructive disease.  No chest pain.  - Continue ASA 81.  - Plavix stopped with anemia.   - Continue atorvastatin   7. ?CREST syndrome: Patient saw [facility]. Per her report, they did not think that she had CREST syndrome.      8. Anemia: Fe deficiency.   -No overt GI bleeding, per her report FOBT was negative.  She is being followed by hematology and is on erythropoeitin.   -It is possible that the anemia comes from Opsumit or Adempas.  However, she has improved symptomatically with these medications.  Would like to continue unless hematologist feels strongly about trying her off of one of them.   -Hgb 7.6>7.3>>7.2 today (baseline 8s-10s)  -Primary team has ordered anemia panel  -Transfuse Hgb < 7.   Length of  Stay: 1  Lyda Jester, PA-C  09/25/2021, 7:54 AM  Advanced Heart Failure Team Pager [phone] (M-F; 7a - 5p)  Please contact [facility] for night-coverage after hours (5p -7a ) and weekends on [email or web address]  Patient seen with PA, agree with the above note.    She is afebrile, now on 15 L HFNC.  CXR yesterday with RLL infiltrate but no fever, WBCs normal, and PCT < 0.1.  Treating empirically for PNA.   No dyspnea at rest.    General: NAD Neck: JVP 8-9 cm, no thyromegaly or thyroid nodule.  Lungs: Dry crackles at bases.  CV: Nondisplaced PMI.  Heart regular S1/S2, no S3/S4, no murmur.  No peripheral edema.   Abdomen: Soft, nontender, no hepatosplenomegaly, no distention.  Skin: Intact without lesions or rashes.  Neurologic: Alert and oriented x 3.  Psych: Normal affect. Extremities: No clubbing or cyanosis.  HEENT: Normal.   Acute on chronic hypoxemic respiratory failure. On 4L oxygen at home, now 15 L HFNC here.  CXR with RLL infiltrate but other signs of infection are absent (normal PCT, afebrile, normal WBCs).  ?PNA, ?progressive ILD.  - Had recent CT chest with ILD.  Will repeat CT chest w/o contrast to look for pneumonic infiltrate versus progressive ILD.  May need pulmonary consult.  - Continue ceftriaxone/azithromycin empirically.  - Can continue her home [place] meds.  - ?Aspiration => to get swallow evaluation today. Add protonix.   Anemia with hgb 7.2 likely contributes to dyspnea.  No overt GI bleeding, followed by hematology and gets erythropoeitin regularly.  Fe studies sent. Will transfuse 1 unit PRBCs with Lasix 40 mg IV x 1.  Loralie Champagne 09/25/2021 9:32 AM

## 2021-09-25 NOTE — Progress Notes (Signed)
PHARMACY - PHYSICIAN COMMUNICATION CRITICAL VALUE ALERT - BLOOD CULTURE IDENTIFICATION (BCID)  [name] is an 88 y.o. female who presented to [facility] on 09/24/2021 with a chief complaint of weakness and respiratory distress.   Assessment:  70 YOF who presented on 2/20 with weakness/resp distress and now with 1 of 2 bottles (1 bottle drawn from 2 sites) growing staph species - presumably a contaminant.   Name of physician (or Provider) Contacted: Broadus John  Current antibiotics: Rocephin + Azithro (CAP coverage)  Changes to prescribed antibiotics recommended:  Patient is on recommended antibiotics - No changes needed  Results for orders placed or performed during the hospital encounter of 09/24/21  Blood Culture ID Panel (Reflexed) (Collected: 09/24/2021  5:31 PM)  Result Value Ref Range   Enterococcus faecalis NOT DETECTED NOT DETECTED   Enterococcus Faecium NOT DETECTED NOT DETECTED   Listeria monocytogenes NOT DETECTED NOT DETECTED   Staphylococcus species DETECTED (A) NOT DETECTED   Staphylococcus aureus (BCID) NOT DETECTED NOT DETECTED   Staphylococcus epidermidis NOT DETECTED NOT DETECTED   Staphylococcus lugdunensis NOT DETECTED NOT DETECTED   Streptococcus species NOT DETECTED NOT DETECTED   Streptococcus agalactiae NOT DETECTED NOT DETECTED   Streptococcus pneumoniae NOT DETECTED NOT DETECTED   Streptococcus pyogenes NOT DETECTED NOT DETECTED   A.calcoaceticus-baumannii NOT DETECTED NOT DETECTED   Bacteroides fragilis NOT DETECTED NOT DETECTED   Enterobacterales NOT DETECTED NOT DETECTED   Enterobacter cloacae complex NOT DETECTED NOT DETECTED   Escherichia coli NOT DETECTED NOT DETECTED   Klebsiella aerogenes NOT DETECTED NOT DETECTED   Klebsiella oxytoca NOT DETECTED NOT DETECTED   Klebsiella pneumoniae NOT DETECTED NOT DETECTED   Proteus species NOT DETECTED NOT DETECTED   Salmonella species NOT DETECTED NOT DETECTED   Serratia marcescens NOT DETECTED NOT DETECTED    Haemophilus influenzae NOT DETECTED NOT DETECTED   Neisseria meningitidis NOT DETECTED NOT DETECTED   Pseudomonas aeruginosa NOT DETECTED NOT DETECTED   Stenotrophomonas maltophilia NOT DETECTED NOT DETECTED   Candida albicans NOT DETECTED NOT DETECTED   Candida auris NOT DETECTED NOT DETECTED   Candida glabrata NOT DETECTED NOT DETECTED   Candida krusei NOT DETECTED NOT DETECTED   Candida parapsilosis NOT DETECTED NOT DETECTED   Candida tropicalis NOT DETECTED NOT DETECTED   Cryptococcus neoformans/gattii NOT DETECTED NOT DETECTED    Alycia Rossetti, PharmD, BCPS 09/25/2021  4:02 PM

## 2021-09-25 NOTE — Progress Notes (Signed)
PROGRESS NOTE    [name]  AQT:[id number] DOB: [birth year] DOA: 09/24/2021 PCP: [name], PA-C  Brief Narrative: 89/F, chronically ill with chronic respiratory failure on 4 L home O2, severe PAH, interstitial lung disease, history of CAD, history of esophageal stricture, CVA, type 2 diabetes mellitus, chronic anemia was brought to the ED by her daughters on account of progressive weakness over the past week, also she was noted to be more hypoxic than usual,'s O2 sats in the 80s on 5 L O2, recently saw Dr. Jamey Reas with ILD, CT chest on 2/10 noted progressive interstitial lung disease. -In the ED she was more hypoxic, was placed on a nonrebreather mask, then transition to high flow nasal cannula 12 L, labs noted hemoglobin of 7.3, BUN 66, creatinine 1.6, BNP 695, chest x-ray?  Patchy right lower lobe airspace opacity, she was started on IV antibiotics and admitted  Subjective: -Feels about the same, no events overnight   Assessment and Plan: * Acute on chronic respiratory failure with hypoxia ([name])- (present on admission) Has chronic hypoxic respiratory failure secondary to ILD and severe PAH on 4-5L O2 at baseline -unfortunately noted to have progressive ILD based on recent CT 2/10,  -Repeat CT chest, noncontrast ordered by cardiology -I suspect her symptoms are likely from progression of ILD, worsening anemia etc., does not appear overtly fluid overloaded -Will follow-up CT chest, transfuse 1 unit of PRBC with Lasix today -Wean O2 as tolerated -Clinically suspicion for pneumonia is low however she does have history of esophageal stricture, SLP evaluation was unremarkable today, procalcitonin is low, afebrile, no new worsening cough, await CT chest before discontinuing antibiotics -Will likely need palliative care evaluation this admission  Chronic diastolic CHF (congestive heart failure) (HCC) - IV Lasix today with blood, does not appear fluid overloaded   Pulmonary hypertension,  unspecified ([place])- (present on admission) Heart failure team following Continue treprostinil, riociguat and opsumit  ILD (interstitial lung disease) ([place]) -seen by rheumatology and pulm, progressive based on recent CT 2/10 -? CREST, but was told by rheumatologist didn't think so per patient -Await repeat CT  Anemia in chronic kidney disease- (present on admission) Seen by oncology with negative w/u and only finding of CKD Receives retacrit q 4 weeks, last injection 09/06/21.  Baseline has been 8-9.  -Denies any overt bleeding, will transfuse 1 unit of PRBC for hemoglobin of 7.2, anemia panel with severe iron deficiency, she denies any overt bleeding, start IV iron tomorrow, not a great candidate for endoscopic evaluation with chronic respiratory failure  Type 2 diabetes mellitus ([place]) a1c in 2020 was 7.3 -CBG stable, continue sliding scale  Acute renal failure superimposed on stage 3b chronic kidney disease ([place]) - Given gentle IV fluids yesterday, creatinine improving down to 1.4 today, baseline creatinine around 1.4, recently was 0.9  CAD/HLD- (present on admission) S/p successful atherectomy with bifurcation stenting of proximal LAD and D2 05/15/18.  Repeat LHC in 3/20 showed nonobstructive disease. -continue ASA and lipitor -plavix held due to anemia   History of CVA (cerebrovascular accident) Continue ASA/statin   Anxiety and depression- (present on admission) Continue remeron and xanax prn   Hypertension- (present on admission) Well controlled, continue coreg   DVT prophylaxis: Add Lovenox Code Status: DNR Family Communication: Daughters at bedside Disposition Plan: To be determined   Consultants:  Cardiology  Procedures:   Antimicrobials:    Objective: Vitals:   09/25/21 0500 09/25/21 0600 09/25/21 0741 09/25/21 1100  BP:  (!) 112/35 109/63 (!) 130/45  Pulse:   83 80  Resp: _0 Temp:  (!) 97.1 F (36.2 C) 97.6 F (36.4 C) 98 F (36.7 C)   TempSrc:  Oral Oral Oral  SpO2:   96% 93%  Weight: 54.7 kg     Height:        Intake/Output Summary (Last 24 hours) at 09/25/2021 1133 Last data filed at 09/25/2021 1132 Gross per 24 hour  Intake 588.03 ml  Output 750 ml  Net -161.97 ml   Filed Weights   09/24/21 1600 09/25/21 0500  Weight: 54.7 kg 54.7 kg    Examination:  General exam: Frail elderly female, chronically ill-appearing, laying in bed, AAOx3, hard of hearing CVS: S1-S2, regular rate rhythm Lungs: Decreased breath sounds the bases, otherwise clear Abdomen: Soft, nontender, bowel sounds present Extremities: No edema Skin: No rashes Psychiatry:  Mood & affect appropriate.     Data Reviewed:   CBC: Recent Labs  Lab 09/24/21 1344 09/24/21 2234 09/25/21 0320  WBC 7.6 6.6 5.8  NEUTROABS 4.9  --   --   HGB 7.6* 7.3* 7.2*  HCT 27.5* 24.9* 25.4*  MCV 89.3 87.4 88.2  PLT 378 314 102   Basic Metabolic Panel: Recent Labs  Lab 09/24/21 1344 09/24/21 1700 09/25/21 0320  NA 134*  --  138  K 4.7  --  3.9  CL 97*  --  103  CO2 27  --  26  GLUCOSE 145*  --  101*  BUN 66*  --  61*  CREATININE 1.67*  --  1.44*  CALCIUM 8.4*  --  8.3*  MG  --  2.1  --    GFR: Estimated Creatinine Clearance: 21.8 mL/min (A) (by C-G formula based on SCr of 1.44 mg/dL (H)). Liver Function Tests: Recent Labs  Lab 09/24/21 1344 09/24/21 1743  AST 21 22  ALT 30 31  ALKPHOS 46 47  BILITOT 0.2* 0.2*  PROT 5.8* 6.2*  ALBUMIN 2.6* 2.6*   No results for input(s): LIPASE, AMYLASE in the last 168 hours. No results for input(s): AMMONIA in the last 168 hours. Coagulation Profile: No results for input(s): INR, PROTIME in the last 168 hours. Cardiac Enzymes: No results for input(s): CKTOTAL, CKMB, CKMBINDEX, TROPONINI in the last 168 hours. BNP (last 3 results) No results for input(s): PROBNP in the last 8760 hours. HbA1C: No results for input(s): HGBA1C in the last 72 hours. CBG: Recent Labs  Lab 09/24/21 1731  09/24/21 2144 09/25/21 0609  GLUCAP 120* 97 98   Lipid Profile: No results for input(s): CHOL, HDL, LDLCALC, TRIG, CHOLHDL, LDLDIRECT in the last 72 hours. Thyroid Function Tests: No results for input(s): TSH, T4TOTAL, FREET4, T3FREE, THYROIDAB in the last 72 hours. Anemia Panel: Recent Labs    09/25/21 0320 09/25/21 0800  VITAMINB12  --  1,495*  FERRITIN  --  12  TIBC  --  326  IRON  --  23*  RETICCTPCT 3.3*  --    Urine analysis:    Component Value Date/Time   COLORURINE YELLOW 11/18/2020 [place] 11/18/2020 1235   LABSPEC 1.012 11/18/2020 1235   PHURINE 6.0 11/18/2020 1235   GLUCOSEU NEGATIVE 11/18/2020 1235   HGBUR NEGATIVE 11/18/2020 [place] 11/18/2020 1235   KETONESUR NEGATIVE 11/18/2020 1235   PROTEINUR NEGATIVE 11/18/2020 1235   UROBILINOGEN 1.0 04/04/2012 1310   NITRITE NEGATIVE 11/18/2020 1235   LEUKOCYTESUR NEGATIVE 11/18/2020 1235   Sepsis Labs: _1 (procalcitonin:4,lacticidven:4)  ) Recent Results (  from the past 240 hour(s))  Resp Panel by RT-PCR (Flu A&B, Covid) Nasopharyngeal Swab     Status: None   Collection Time: 09/24/21  2:07 PM   Specimen: Nasopharyngeal Swab; Nasopharyngeal(NP) swabs in vial transport medium  Result Value Ref Range Status   SARS Coronavirus 2 by RT PCR NEGATIVE NEGATIVE Final    Comment: (NOTE) SARS-CoV-2 target nucleic acids are NOT DETECTED.  The SARS-CoV-2 RNA is generally detectable in upper respiratory specimens during the acute phase of infection. The lowest concentration of SARS-CoV-2 viral copies this assay can detect is 138 copies/mL. A negative result does not preclude SARS-Cov-2 infection and should not be used as the sole basis for treatment or other patient management decisions. A negative result may occur with  improper specimen collection/handling, submission of specimen other than nasopharyngeal swab, presence of viral mutation(s) within the areas targeted by this  assay, and inadequate number of viral copies(<138 copies/mL). A negative result must be combined with clinical observations, patient history, and epidemiological information. The expected result is Negative.  Fact Sheet for Patients:  EntrepreneurPulse.com.au  Fact Sheet for Healthcare Providers:  IncredibleEmployment.be  This test is no t yet approved or cleared by the [place] FDA and  has been authorized for detection and/or diagnosis of SARS-CoV-2 by FDA under an Emergency Use Authorization (EUA). This EUA will remain  in effect (meaning this test can be used) for the duration of the COVID-19 declaration under Section 564(b)(1) of the Act, 21 U.S.C.section 360bbb-3(b)(1), unless the authorization is terminated  or revoked sooner.       Influenza A by PCR NEGATIVE NEGATIVE Final   Influenza B by PCR NEGATIVE NEGATIVE Final    Comment: (NOTE) The Xpert Xpress SARS-CoV-2/FLU/RSV plus assay is intended as an aid in the diagnosis of influenza from Nasopharyngeal swab specimens and should not be used as a sole basis for treatment. Nasal washings and aspirates are unacceptable for Xpert Xpress SARS-CoV-2/FLU/RSV testing.  Fact Sheet for Patients: EntrepreneurPulse.com.au  Fact Sheet for Healthcare Providers: IncredibleEmployment.be  This test is not yet approved or cleared by the [place] FDA and has been authorized for detection and/or diagnosis of SARS-CoV-2 by FDA under an Emergency Use Authorization (EUA). This EUA will remain in effect (meaning this test can be used) for the duration of the COVID-19 declaration under Section 564(b)(1) of the Act, 21 U.S.C. section 360bbb-3(b)(1), unless the authorization is terminated or revoked.  Performed at [facility], [place] [address], [place], [place] [address]   Culture, blood (routine x 2)     Status: None (Preliminary result)   Collection  Time: 09/24/21  5:19 PM   Specimen: BLOOD  Result Value Ref Range Status   Specimen Description BLOOD BLOOD LEFT ARM  Final   Special Requests   Final    BOTTLES DRAWN AEROBIC ONLY Blood Culture adequate volume   Culture   Final    NO GROWTH < 24 HOURS Performed at [facility], [place] [address], [place], [place] [address]    Report Status PENDING  Incomplete  Culture, blood (routine x 2)     Status: None (Preliminary result)   Collection Time: 09/24/21  5:31 PM   Specimen: BLOOD  Result Value Ref Range Status   Specimen Description BLOOD LEFT ANTECUBITAL  Final   Special Requests   Final    BOTTLES DRAWN AEROBIC ONLY Blood Culture adequate volume   Culture   Final    NO GROWTH < 24 HOURS  Performed at [facility], [place] [address] [place], [place] [address]    Report Status PENDING  Incomplete     Radiology Studies: DG Chest Port 1 View  Result Date: 09/24/2021 CLINICAL DATA:  Shortness of breath EXAM: PORTABLE CHEST 1 VIEW COMPARISON:  09/14/2021 FINDINGS: Stable cardiomegaly. Aortic atherosclerosis. Patchy right lower lobe airspace opacity. Small right pleural effusion. Left lung base appears clear. No pneumothorax. Bony demineralization. IMPRESSION: Patchy right lower lobe airspace opacity and small right pleural effusion. Findings most suggestive of pneumonia. Electronically Signed   By: Davina Poke D.O.   On: 09/24/2021 14:35     Scheduled Meds:  sodium chloride   Intravenous Once   aspirin EC  81 mg Oral Daily   atorvastatin  80 mg Oral Daily   azithromycin  500 mg Oral QPM   carvedilol  3.125 mg Oral BID WC   cyclobenzaprine  10 mg Oral q AM   furosemide  40 mg Intravenous Once   insulin aspart  0-9 Units Subcutaneous TID WC   macitentan  10 mg Oral Daily   mirtazapine  30 mg Oral QHS   polyvinyl alcohol  1 drop Both Eyes BID   sodium chloride flush  3 mL Intravenous Q12H   Treprostinil Diolamine ER  0.125 mg Oral TID   And   Treprostinil Diolamine  ER  0.75 mg Oral TID   Continuous Infusions:  sodium chloride     cefTRIAXone (ROCEPHIN)  IV       LOS: 1 day    Time spent: 54mn    PDomenic Polite MD [facility]   09/25/2021, 11:33 AM

## 2021-09-25 NOTE — Plan of Care (Signed)
Problem: Education: ?Goal: Ability to verbalize understanding of medication therapies will improve ?Outcome: Progressing ?  ?Problem: Activity: ?Goal: Capacity to carry out activities will improve ?Outcome: Progressing ?  ?Problem: Cardiac: ?Goal: Ability to achieve and maintain adequate cardiopulmonary perfusion will improve ?Outcome: Progressing ?  ?

## 2021-09-25 NOTE — Evaluation (Signed)
Clinical/Bedside Swallow Evaluation Patient Details  Name: [name] MRN: [id number] Date of Birth: Dec 12, 1931  Today's Date: 09/25/2021 Time: SLP Start Time (ACUTE ONLY): 6301 SLP Stop Time (ACUTE ONLY): 0945 SLP Time Calculation (min) (ACUTE ONLY): 20 min  Past Medical History:  Past Medical History:  Diagnosis Date   Anemia    Anxiety    Arthritis    osteo; "mostly hands, knees, probably back too" (12/09/2016)   Chronic lower back pain    Complication of anesthesia 2011   resp distress -on vent after surgery   Coronary artery disease    Depression    Diabetes ([place])    Esophageal stricture    GERD (gastroesophageal reflux disease)    Headache    out grew them   High cholesterol    History of blood transfusion 2011; ?03/2012   "related to ORs" (12/09/2016)   Hypertension    Intestinal obstruction ([place])    Macular degeneration of left eye    dx over 55 yrs ago.....hasn't changed much   Shingles    Squamous carcinoma 2013   squamas cell on scalp--took 14 radiation tx--2013   Stroke [facility]) 1967   Mini stroke;  No lasting deficits   Type II diabetes mellitus ([place]) dx'd 11/2016   Past Surgical History:  Past Surgical History:  Procedure Laterality Date   ABDOMINAL HYSTERECTOMY     APPENDECTOMY     BACK SURGERY     BALLOON DILATION N/A 06/24/2020   Procedure: BALLOON DILATION;  Surgeon: Clarene Essex, MD;  Location: [place];  Service: Endoscopy;  Laterality: N/A;   CATARACT EXTRACTION, BILATERAL Bilateral    CHOLECYSTECTOMY     COLON SURGERY     COLONOSCOPY     CORONARY ATHERECTOMY N/A 05/15/2018   Procedure: CORONARY ATHERECTOMY;  Surgeon: Leonie Man, MD;  Location: [place] [facility];  Service: Cardiovascular;  Laterality: N/A;   CORONARY STENT INTERVENTION N/A 05/15/2018   Procedure: CORONARY STENT INTERVENTION;  Surgeon: Leonie Man, MD;  Location: [place] [facility];  Service: Cardiovascular;  Laterality: N/A;   ESOPHAGOGASTRODUODENOSCOPY N/A  06/24/2020   Procedure: ESOPHAGOGASTRODUODENOSCOPY (EGD);  Surgeon: Clarene Essex, MD;  Location: [place];  Service: Endoscopy;  Laterality: N/A;   ESOPHAGOGASTRODUODENOSCOPY (EGD) WITH ESOPHAGEAL DILATION  "several times"   ESOPHAGOGASTRODUODENOSCOPY (EGD) WITH PROPOFOL N/A 12/31/2016   Procedure: ESOPHAGOGASTRODUODENOSCOPY (EGD) WITH PROPOFOL;  Surgeon: Wilford Corner, MD;  Location: [place];  Service: Endoscopy;  Laterality: N/A;   EYE SURGERY     FRACTURE SURGERY     IR FLUORO GUIDE CV LINE RIGHT  01/02/2017   IR US GUIDE VASC ACCESS RIGHT  01/02/2017   JOINT REPLACEMENT     LEFT HEART CATH AND CORONARY ANGIOGRAPHY N/A 10/09/2018   Procedure: LEFT HEART CATH AND CORONARY ANGIOGRAPHY;  Surgeon: Larey Dresser, MD;  Location: [place] [facility];  Service: Cardiovascular;  Laterality: N/A;   LUMBAR DISC SURGERY  10/25/2014   Right L4-L5 removal of seven free fragments of disk mostly posterolaterally.  Lysis of adhesion.  Microscope/notes 10/26/2014   LUMBAR LAMINECTOMY/DECOMPRESSION MICRODISCECTOMY Right 08/09/2014   Procedure: Right Lumbar Four to Five Microdiskectomy;  Surgeon: Floyce Stakes, MD;  Location: [facility];  Service: Neurosurgery;  Laterality: Right;  Right L4-5 Microdiskectomy   ORIF PERIPROSTHETIC FRACTURE  03/31/2012   Procedure: OPEN REDUCTION INTERNAL FIXATION (ORIF) PERIPROSTHETIC FRACTURE;  Surgeon: Mauri Pole, MD;  Location: [facility];  Service: Orthopedics;  Laterality: Left;  Open reduction internal fixation Left  distal femur periprosthetic fracture   RIGHT/LEFT HEART CATH AND CORONARY ANGIOGRAPHY N/A 05/14/2018   Procedure: RIGHT/LEFT HEART CATH AND CORONARY ANGIOGRAPHY;  Surgeon: Larey Dresser, MD;  Location: [place] [facility];  Service: Cardiovascular;  Laterality: N/A;   SMALL INTESTINE SURGERY  2011   "really bad blockage; went into respiratory distress and was on ventilator after surgery; [facility]"   SQUAMOUS CELL CARCINOMA EXCISION  ~ 2012    S/P "cut off her head then 15 radiation txs"    TOTAL KNEE ARTHROPLASTY Bilateral    bilateral   HPI:  [name] is a 88 y.o. female who presented to ED 09/24/21 in respiratory distress.  Dx acute on chronic respiratory failure with hypoxia, ARF. Prior medical history significant for pulmonary HTN, chronic respiratory failure on 4L oxygen, ILD, CAD, esophageal stricture, GERD, HLD, hx of stroke, T2DM  CXR 2/20: patchy right lower lobe airspace opacity and small right pleural effusion, most suggestive of pneumonia. EGD Nov 2021 stenosis with dilation.    Assessment / Plan / Recommendation  Clinical Impression  Pt presents with normal oropharyngeal swallow with thorough mastication, the appearance of a brisk swallow response, no s/sx of aspiration, good coordination of respiration/swallowing.  Daughters at bedside report multiple prior EGDs and dilations (last was 2021).  They describe occasional regurgation of POs (once every few weeks). Pt may be experiencing post-prandial aspiration related to esophageal deficits.  D/W family some methods to minimize esophageal backflow, including the benefit of elevating the HOB 4-6 inches at night and avoiding PO intake two hours before bedtime.  Recommend resuming a regular consistency diet; thin liquids; crush large meds. There are no further acute care SLP needs. Our service will sign off. SLP Visit Diagnosis: Dysphagia, pharyngoesophageal phase (R13.14)    Aspiration Risk  No limitations    Diet Recommendation   Regular solids, thin liquids  Medication Administration:  (crush if large)    Other  Recommendations Oral Care Recommendations: Oral care BID    Recommendations for follow up therapy are one component of a multi-disciplinary discharge planning process, led by the attending physician.  Recommendations may be updated based on patient status, additional functional criteria and insurance authorization.  Follow up Recommendations No SLP follow  up        Swallow Study   General HPI: [name] is a 88 y.o. female who presented to ED 09/24/21 in respiratory distress.  Dx acute on chronic respiratory failure with hypoxia, ARF. Prior medical history significant for pulmonary HTN, chronic respiratory failure on 4L oxygen, ILD, CAD, esophageal stricture, GERD, HLD, hx of stroke, T2DM  CXR 2/20: patchy right lower lobe airspace opacity and small right pleural effusion, most suggestive of pneumonia. EGD Nov 2021 stenosis with dilation. Type of Study: Bedside Swallow Evaluation Previous Swallow Assessment: no Diet Prior to this Study: NPO Temperature Spikes Noted: No Respiratory Status: Nasal cannula (15L HFNC) History of Recent Intubation: No Behavior/Cognition: Alert;Cooperative Oral Cavity Assessment: Within Functional Limits Oral Care Completed by SLP: No Oral Cavity - Dentition: Dentures, top;Dentures, bottom Vision: Functional for self-feeding Self-Feeding Abilities: Able to feed self Patient Positioning: Upright in bed Baseline Vocal Quality: Normal Volitional Cough: Strong Volitional Swallow: Able to elicit    Oral/Motor/Sensory Function Overall Oral Motor/Sensory Function: Within functional limits   Ice Chips Ice chips: Not tested   Thin Liquid Thin Liquid: Within functional limits    Nectar Thick Nectar Thick Liquid: Not tested   Honey Thick Honey Thick Liquid: Not  tested   Puree Puree: Within functional limits   Solid     Solid: Within functional limits      Juan Quam Laurice 09/25/2021,9:52 AM  Estill Bamberg L. Tivis Ringer, [place] Office number [phone] Pager [phone]

## 2021-09-26 DIAGNOSIS — I5032 Chronic diastolic (congestive) heart failure: Secondary | ICD-10-CM

## 2021-09-26 DIAGNOSIS — I5033 Acute on chronic diastolic (congestive) heart failure: Secondary | ICD-10-CM

## 2021-09-26 LAB — GLUCOSE, CAPILLARY
Glucose-Capillary: 123 mg/dL — ABNORMAL HIGH (ref 70–99)
Glucose-Capillary: 139 mg/dL — ABNORMAL HIGH (ref 70–99)
Glucose-Capillary: 99 mg/dL (ref 70–99)
Glucose-Capillary: 165 mg/dL — ABNORMAL HIGH (ref 70–99)

## 2021-09-26 LAB — PROCALCITONIN: Procalcitonin: <0.1 ng/mL

## 2021-09-26 LAB — TYPE AND SCREEN
ABO/RH(D): O POS
Antibody Screen: NEGATIVE
Unit division: 0

## 2021-09-26 LAB — HEMOGLOBIN AND HEMATOCRIT, BLOOD
HCT: 31.8 % — ABNORMAL LOW (ref 36.0–46.0)
Hemoglobin: 9.5 g/dL — ABNORMAL LOW (ref 12.0–15.0)

## 2021-09-26 LAB — BPAM RBC
Blood Product Expiration Date: 202303242359
ISSUE DATE / TIME: 202302211459
Unit Type and Rh: 5100

## 2021-09-26 LAB — BASIC METABOLIC PANEL
Anion gap: 10 (ref 5–15)
BUN: 41 mg/dL — ABNORMAL HIGH (ref 8–23)
CO2: 29 mmol/L (ref 22–32)
Calcium: 8.6 mg/dL — ABNORMAL LOW (ref 8.9–10.3)
Chloride: 102 mmol/L (ref 98–111)
Creatinine, Ser: 1.12 mg/dL — ABNORMAL HIGH (ref 0.44–1.00)
GFR, Estimated: 48 mL/min — ABNORMAL LOW (ref >60)
Glucose, Bld: 109 mg/dL — ABNORMAL HIGH (ref 70–99)
Potassium: 3.6 mmol/L (ref 3.5–5.1)
Sodium: 141 mmol/L (ref 135–145)

## 2021-09-26 MED ORDER — NA FERRIC GLUC CPLX IN SUCROSE 12.5 MG/ML IV SOLN
250.0000 mg | Freq: Every day | INTRAVENOUS | Status: AC
  Administered 2021-09-26 – 2021-09-27 (×2): 250 mg via INTRAVENOUS
  Filled 2021-09-26 (×2): qty 20

## 2021-09-26 MED ORDER — PREDNISONE 20 MG PO TABS
40.0000 mg | ORAL_TABLET | Freq: Every day | ORAL | Status: DC
  Administered 2021-09-26 – 2021-09-28 (×3): 40 mg via ORAL
  Filled 2021-09-26 (×4): qty 2

## 2021-09-26 MED ORDER — PREDNISONE 20 MG PO TABS: 20.0000 mg | ORAL_TABLET | Freq: Every day | ORAL | Status: DC

## 2021-09-26 MED ORDER — PREDNISONE 20 MG PO TABS: 30.0000 mg | ORAL_TABLET | Freq: Every day | ORAL | Status: DC

## 2021-09-26 MED ORDER — PREDNISONE 10 MG PO TABS: 10.0000 mg | ORAL_TABLET | Freq: Every day | ORAL | Status: DC

## 2021-09-26 MED ORDER — SODIUM CHLORIDE 0.9 % IV SOLN
510.0000 mg | Freq: Once | INTRAVENOUS | Status: DC
  Filled 2021-09-26: qty 17

## 2021-09-26 NOTE — Plan of Care (Signed)
Problem: Education: Goal: Ability to demonstrate management of disease process will improve Outcome: Progressing   Problem: Education: Goal: Ability to verbalize understanding of medication therapies will improve Outcome: Progressing   Problem: Education: Goal: Individualized Educational Video(s) Outcome: Progressing   Problem: Activity: Goal: Capacity to carry out activities will improve Outcome: Progressing

## 2021-09-26 NOTE — Progress Notes (Signed)
Patient ID: [name], female   DOB: 1932-04-06, 88 y.o.   MRN: [id number]     Advanced Heart Failure Rounding Note  PCP-Cardiologist: Jenkins Rouge, MD   Subjective:    Remains on azithromycin + ceftriaxone. AF. WBCs not elevated.  CT chest 2/21 showed emphysema with new patchy infiltrates both lower lobes concerning for PNA.  GPCs growing in blood cultures => coag negative Staph.  PCT <0.10   Respiratory panel negative.  Hgb 7.2 => 9.5 with 1 unit PRBCs.  Tsat 7%.   AKI improving. SCr 1.67>>1.44>>1.12 (baseline <1.0).   Not short of breath but on high O2 requirements, 15L HFNC w/ O2 sats ~94% (home baseline 4L). Denies cough. No complaints.   Feels anxious.   Echo this admission: EF 55-60%, mild LVH, RV severely enlarged with mildly decreased systolic function, D-shaped setpum, PASP 64, IVC dilated.   Objective:   Weight Range: 54.6 kg Body mass index is 22.02 kg/m.   Vital Signs:   Temp:  [97.9 F (36.6 C)-98.5 F (36.9 C)] 98 F (36.7 C) (02/22 0730) Pulse Rate:  [80-101] 96 (02/22 0730) Resp:  [14-20] 18 (02/22 0730) BP: (122-149)/(45-71) 149/65 (02/22 0730) SpO2:  [91 %-100 %] 93 % (02/22 0730) Weight:  [54.6 kg] 54.6 kg (02/22 0356) Last BM Date : 09/22/21  Weight change: Filed Weights   09/24/21 1600 09/25/21 0500 09/26/21 0356  Weight: 54.7 kg 54.7 kg 54.6 kg    Intake/Output:   Intake/Output Summary (Last 24 hours) at 09/26/2021 0846 Last data filed at 09/26/2021 2395 Gross per 24 hour  Intake 1280.83 ml  Output 1750 ml  Net -469.17 ml      Physical Exam    General: NAD, frail Neck: JVP about 10 cm, no thyromegaly or thyroid nodule.  Lungs: Crackles at bases.  CV: Nondisplaced PMI.  Heart regular S1/S2, no S3/S4, no murmur.  No peripheral edema.   Abdomen: Soft, nontender, no hepatosplenomegaly, no distention.  Skin: Intact without lesions or rashes.  Neurologic: Alert and oriented x 3.  Psych: Normal affect. Extremities: No clubbing or  cyanosis.  HEENT: Normal.   Telemetry   NSR 70s (personally reviewed)  Labs    CBC Recent Labs    09/24/21 1344 09/24/21 2234 09/25/21 0320 09/25/21 2347  WBC 7.6 6.6 5.8  --   NEUTROABS 4.9  --   --   --   HGB 7.6* 7.3* 7.2* 9.5*  HCT 27.5* 24.9* 25.4* 31.8*  MCV 89.3 87.4 88.2  --   PLT 378 314 305  --    Basic Metabolic Panel Recent Labs    09/24/21 1700 09/25/21 0320 09/26/21 0344  NA  --  138 141  K  --  3.9 3.6  CL  --  103 102  CO2  --  26 29  GLUCOSE  --  101* 109*  BUN  --  61* 41*  CREATININE  --  1.44* 1.12*  CALCIUM  --  8.3* 8.6*  MG 2.1  --   --    Liver Function Tests Recent Labs    09/24/21 1344 09/24/21 1743  AST 21 22  ALT 30 31  ALKPHOS 46 47  BILITOT 0.2* 0.2*  PROT 5.8* 6.2*  ALBUMIN 2.6* 2.6*   No results for input(s): LIPASE, AMYLASE in the last 72 hours. Cardiac Enzymes No results for input(s): CKTOTAL, CKMB, CKMBINDEX, TROPONINI in the last 72 hours.  BNP: BNP (last 3 results) Recent Labs    12/29/20 1438 08/24/21  1049 09/24/21 1344  BNP 358.7* 280.8* 695.1*    ProBNP (last 3 results) No results for input(s): PROBNP in the last 8760 hours.   D-Dimer No results for input(s): DDIMER in the last 72 hours. Hemoglobin A1C No results for input(s): HGBA1C in the last 72 hours. Fasting Lipid Panel No results for input(s): CHOL, HDL, LDLCALC, TRIG, CHOLHDL, LDLDIRECT in the last 72 hours. Thyroid Function Tests No results for input(s): TSH, T4TOTAL, T3FREE, THYROIDAB in the last 72 hours.  Invalid input(s): FREET3  Other results:   Imaging    CT CHEST WO CONTRAST  Result Date: 09/25/2021 CLINICAL DATA:  Pneumonia EXAM: CT CHEST WITHOUT CONTRAST TECHNIQUE: Multidetector CT imaging of the chest was performed following the standard protocol without IV contrast. RADIATION DOSE REDUCTION: This exam was performed according to the departmental dose-optimization program which includes automated exposure control,  adjustment of the mA and/or kV according to patient size and/or use of iterative reconstruction technique. COMPARISON:  09/14/2021 FINDINGS: Cardiovascular: Heart is enlarged in size. Coronary artery calcifications are seen. There is ectasia of main pulmonary artery measuring 3.9 cm suggesting pulmonary arterial hypertension. Mediastinum/Nodes: There are slightly enlarged lymph nodes in mediastinum with no significant change. Left lobe of thyroid is enlarged with multiple nodules. Lungs/Pleura: Centrilobular emphysema is seen. There is interval increase in patchy infiltrates in both lower lung fields. There are small linear densities in right middle lobe and lingula with interval worsening. There is linear density in the lumen of trachea, possibly mucous. There is interval appearance of small bilateral pleural effusions, more so on the right side. There is no pneumothorax. Upper Abdomen: There are multiple low-density lesions of varying sizes in the liver with no significant interval change. Small ascites is present Musculoskeletal: Compression fracture of upper endplate of body of L1 vertebra has not changed. IMPRESSION: COPD. There is interval increase in infiltrates in both lungs, especially in the lower lung fields suggesting atelectasis/pneumonia. There is interval appearance of small bilateral pleural effusions, more so on the right side. Cardiomegaly. Coronary artery calcifications are seen. Pulmonary arterial hypertension. There are multiple low-density lesions in the liver with no significant change suggesting possible cysts or hemangiomas. Small ascites. Left lobe of thyroid is enlarged with multiple nodules. Follow-up thyroid sonogram may be considered. Electronically Signed   By: Elmer Picker M.D.   On: 09/25/2021 13:55   ECHOCARDIOGRAM COMPLETE  Result Date: 09/25/2021    ECHOCARDIOGRAM REPORT   Patient Name:   [name] Date of Exam: 09/25/2021 Medical Rec #:  [id number]      Height:        62.0 in Accession #:    [id number]     Weight:       120.6 lb Date of Birth:  [birth year]       BSA:          1.542 m Patient Age:    27 years       BP:           112/35 mmHg Patient Gender: F              HR:           87 bpm. Exam Location:  Inpatient Procedure: 2D Echo, Cardiac Doppler and Color Doppler Indications:    CHF  History:        Patient has prior history of Echocardiogram examinations. CAD,                 Pulmonary HTN; Risk  Factors:Hypertension and Diabetes.  Sonographer:    Jyl Heinz Referring Phys: [id number] [place]  1. Left ventricular ejection fraction, by estimation, is 55 to 60%. The left ventricle has normal function. The left ventricle has no regional wall motion abnormalities. There is mild concentric left ventricular hypertrophy. Left ventricular diastolic parameters are consistent with Grade I diastolic dysfunction (impaired relaxation). There is the interventricular septum is flattened in systole, consistent with right ventricular pressure overload.  2. Right ventricular systolic function is mildly reduced. The right ventricular size is severely enlarged. Mildly increased right ventricular wall thickness. There is severely elevated pulmonary artery systolic pressure. The estimated right ventricular systolic pressure is 57.8 mmHg.  3. The mitral valve is normal in structure. No evidence of mitral valve regurgitation.  4. The aortic valve was not well visualized. There is mild calcification of the aortic valve. There is mild thickening of the aortic valve. Aortic valve regurgitation is not visualized. Aortic valve sclerosis/calcification is present, without any evidence of aortic stenosis. Comparison(s): No significant change from prior study. Prior images reviewed side by side. PA pressure was underestimated on the previous study due to poor TR jet. FINDINGS  Left Ventricle: Left ventricular ejection fraction, by estimation, is 55 to 60%. The left ventricle has normal  function. The left ventricle has no regional wall motion abnormalities. The left ventricular internal cavity size was normal in size. There is  mild concentric left ventricular hypertrophy. The interventricular septum is flattened in systole, consistent with right ventricular pressure overload. Left ventricular diastolic parameters are consistent with Grade I diastolic dysfunction (impaired relaxation). Normal left ventricular filling pressure. Right Ventricle: The right ventricular size is severely enlarged. Mildly increased right ventricular wall thickness. Right ventricular systolic function is mildly reduced. There is severely elevated pulmonary artery systolic pressure. The tricuspid regurgitant velocity is 3.51 m/s, and with an assumed right atrial pressure of 15 mmHg, the estimated right ventricular systolic pressure is 46.9 mmHg. Left Atrium: Left atrial size was normal in size. Right Atrium: Right atrial size was normal in size. Pericardium: There is no evidence of pericardial effusion. Mitral Valve: The mitral valve is normal in structure. No evidence of mitral valve regurgitation. Tricuspid Valve: The tricuspid valve is normal in structure. Tricuspid valve regurgitation is mild. Aortic Valve: The aortic valve was not well visualized. There is mild calcification of the aortic valve. There is mild thickening of the aortic valve. Aortic valve regurgitation is not visualized. Aortic valve sclerosis/calcification is present, without any evidence of aortic stenosis. Aortic valve peak gradient measures 14.3 mmHg. Pulmonic Valve: The pulmonic valve was not well visualized. Pulmonic valve regurgitation is mild. Aorta: The aortic root and ascending aorta are structurally normal, with no evidence of dilitation. IAS/Shunts: No atrial level shunt detected by color flow Doppler.  LEFT VENTRICLE PLAX 2D LVIDd:         4.21 cm      Diastology LVIDs:         2.62 cm      LV e' lateral:   7.83 cm/s LV PW:         1.20 cm       LV E/e' lateral: 9.8 LV IVS:        1.17 cm LVOT diam:     2.00 cm LV SV:         76 LV SV Index:   49 LVOT Area:     3.14 cm  LV Volumes (MOD) LV vol d, MOD A2C: 107.0 ml  LV vol d, MOD A4C: 86.2 ml LV vol s, MOD A2C: 45.6 ml LV vol s, MOD A4C: 36.2 ml LV SV MOD A2C:     61.4 ml LV SV MOD A4C:     86.2 ml LV SV MOD BP:      57.5 ml RIGHT VENTRICLE RV Basal diam:  4.29 cm RV Mid diam:    5.28 cm RV S prime:     9.25 cm/s TAPSE (M-mode): 1.2 cm LEFT ATRIUM             Index        RIGHT ATRIUM           Index LA diam:        3.50 cm 2.27 cm/m   RA Area:     17.70 cm LA Vol (A2C):   40.1 ml 26.01 ml/m  RA Volume:   51.80 ml  33.60 ml/m LA Vol (A4C):   35.4 ml 22.96 ml/m LA Biplane Vol: 37.9 ml 24.58 ml/m  AORTIC VALVE                 PULMONIC VALVE AV Area (Vmax): 1.91 cm     PV Vmax:       2.19 m/s AV Vmax:        189.00 cm/s  PV Peak grad:  19.2 mmHg AV Peak Grad:   14.3 mmHg LVOT Vmax:      115.00 cm/s LVOT Vmean:     81.300 cm/s LVOT VTI:       0.242 m  AORTA Ao Root diam: 3.20 cm Ao Asc diam:  3.30 cm MITRAL VALVE                TRICUSPID VALVE MV Area (PHT): 3.65 cm     TR Peak grad:   49.3 mmHg MV Decel Time: 208 msec     TR Vmax:        351.00 cm/s MV E velocity: 77.00 cm/s MV A velocity: 135.00 cm/s  SHUNTS MV E/A ratio:  0.57         Systemic VTI:  0.24 m                             Systemic Diam: 2.00 cm Mihai Croitoru MD Electronically signed by Sanda Klein MD Signature Date/Time: 09/25/2021/12:07:19 PM    Final      Medications:     Scheduled Medications:  sodium chloride   Intravenous Once   aspirin EC  81 mg Oral Daily   atorvastatin  80 mg Oral Daily   azithromycin  500 mg Oral QPM   carvedilol  3.125 mg Oral BID WC   cyclobenzaprine  10 mg Oral q AM   enoxaparin (LOVENOX) injection  30 mg Subcutaneous Q24H   insulin aspart  0-9 Units Subcutaneous TID WC   macitentan  10 mg Oral Daily   mirtazapine  30 mg Oral QHS   polyvinyl alcohol  1 drop Both Eyes BID   Riociguat  2.5  mg Oral TID   sodium chloride flush  3 mL Intravenous Q12H   Treprostinil Diolamine ER  0.125 mg Oral TID   And   Treprostinil Diolamine ER  0.75 mg Oral TID    Infusions:  sodium chloride     cefTRIAXone (ROCEPHIN)  IV 1 g (09/25/21 2022)   ferric gluconate (FERRLECIT) IVPB      PRN Medications: sodium chloride, acetaminophen **OR** acetaminophen,  ALPRAZolam, melatonin, sodium chloride, sodium chloride flush, traMADol-acetaminophen    Patient Profile   88 y/o woman with possible scleroderma/CREST with ILD and PAH, chronic diastolic HF,hiatal hernia and CAD, admitted for a/c hypoxic respiratory failure in the setting of large RLL PNA. Also w/ AKI, felt to be dehydrated.    Assessment/Plan   1. Chronic diastolic CHF:  Echo this admission with EF 55-60%, mild LVH, RV severely enlarged with mildly decreased systolic function, D-shaped septum, PASP 64, IVC dilated.  Suspect mild volume overload on exam. Creatinine down to 1.12.  - Lasix 40 mg IV bid x 2 doses today, probably back to po tomorrow.  2. Pulmonary hypertension: She appeared to have at least moderate pulmonary hypertension by echo in 7/19 with RV failure. CT chest in 2019 showed mild emphysema, which should not explain her degree of RV failure.  V/Q scan did not show evidence for chronic PEs. Anti-centromere antibody, ANA, and RF were all positive.  ?CREST variant of scleroderma.  She is on home oxygen 4L [place].  [place] 05/15/18 showed moderate PAH but very high PVR and low cardiac output.  This was concerning for advanced pulmonary hypertension.  Repeat echo in 3/21 showed moderately dilated/dysfunctional RV but PASP estimate was lower.  Echo this admission with  EF 55-60%, mild LVH, RV severely enlarged with mildly decreased systolic function, D-shaped setpum, PASP 64, IVC dilated.  Recent chest CT 02/23 suggestive of progressive ILD. ? Scleroderma related ILD. Now follows with Pulmonary.  - She did not tolerate Uptravi.   - Continue  riociguat, now on goal dose.  - Continue Opsumit 10 mg daily.  - Continue orenitram, she is unable to titrate any higher due to GI side effects.   - Refused sleep study. 3. Acute on chronic respiratory failure with hypoxia:  On 4L O2 at home, currently 15L HFNC.  Though PCT < 0.1 and WBCs not elevated, repeat CT chest shows new patchy infiltrates in bilateral lower lobes concerning for PNA.  Swallow study without significant aspiration.  - Agree with PNA treatment with ceftriaxone/azithro. - Coag negative Staph is likely contaminant.  - Incentive spirometry.  - Wean oxygen, keep sats > 88%.  4. AKI: Creatinine 1.12 today.  Gentle diuresis, follow closely.  5. CAD: Coronary angiography showed complex bifurcation lesion with 95% stenosis proximal LAD with calcification at take-off of large D2. The ostial/proximal D2 also had 95% calcified stenosis. Suspect this lesion played a role in her exertional dyspnea and also in her mildly decreased LV systolic function (EF 16-38% on echo in 7/19).  S/p successful atherectomy with bifurcation stenting of proximal LAD and D2 05/15/18.  Repeat LHC in 3/20 showed nonobstructive disease.  No chest pain.  - Continue ASA 81.  - Plavix stopped with anemia.   - Continue atorvastatin 6. ?CREST syndrome: Patient saw [facility] Rheumatology. Per her report, they did not think that she had CREST syndrome.    7. Anemia: Fe deficiency. No overt GI bleeding, per her report FOBT was negative.  She is being followed by hematology and is on erythropoeitin.  It is possible that the anemia comes from Opsumit or Adempas.  However, she improved initially while still on these medications.  Would like to continue unless hematologist feels strongly about trying her off of one of them.  1 unit PRBCs yesterday, also has had Feraheme.  - Transfuse Hgb < 7.  Mobilize, PT/OT.   Length of Stay: 2  Loralie Champagne, MD  09/26/2021, 8:46 AM  Advanced  Heart Failure Team Pager [phone]  (M-F; 7a - 5p)  Please contact [facility] for night-coverage after hours (5p -7a ) and weekends on [email or web address]

## 2021-09-26 NOTE — TOC CM/SW Note (Signed)
..  Transition of Care [facility]) Screening Note   Patient Details  Name: [name] Date of Birth: [birth year]   Transition of Care [facility]) CM/SW Contact:    Erenest Rasher, RN Phone Number: [phone] 09/26/2021, 8:34 AM    Transition of Care Department [facility]) has reviewed patient. We will continue to monitor patient advancement through interdisciplinary progression rounds. HF TOC CM/CSW following for possible HH vs SNF. Palliative plans to follow up with patient.

## 2021-09-26 NOTE — Progress Notes (Addendum)
PROGRESS NOTE    [name]  NET:[id number] DOB: Jan 18, 1932 DOA: 09/24/2021 PCP: [name], PA-C  Brief Narrative: 88/F, chronically ill with chronic respiratory failure on 4 L home O2, severe PAH, interstitial lung disease, history of CAD, history of esophageal stricture, CVA, type 2 diabetes mellitus, chronic anemia was brought to the ED by her daughters on account of progressive weakness over the past week, also she was noted to be more hypoxic than usual,'s O2 sats in the 80s on 5 L O2, recently saw Dr. Jamey Reas with ILD, CT chest on 2/10 noted progressive interstitial lung disease. -In the ED she was more hypoxic, was placed on a nonrebreather mask, then transition to high flow nasal cannula 12 L, labs noted hemoglobin of 7.3, BUN 66, creatinine 1.6, BNP 695, chest x-ray?  Patchy right lower lobe airspace opacity, she was started on IV antibiotics and admitted  Subjective: -Feels weak and tired, breathing relatively unchanged  Assessment and Plan:  * Acute on chronic respiratory failure with hypoxia (HCC)- (present on admission) Has chronic hypoxic respiratory failure secondary to ILD and severe PAH on 4-5L O2 at baseline -unfortunately noted to have progressive ILD based on recent CT 2/10,  -Repeat CT chest 2/21 with evidence of basilar infiltrates, consistent with pneumonia, continue IV ceftriaxone and azithromycin, history of known esophageal stricture, no symptoms of dysphagia at this time, SLP evaluation yesterday was unremarkable -In addition progression of ILD also likely contributing to her symptoms, attempt to wean O2 as tolerated -Transfused 1 unit PRBC yesterday -I think her overall prognosis is poor, will ask pulmonary to weigh in -Anticipate need for palliative care evaluation this admission  Acute on chronic diastolic CHF (congestive heart failure) (HCC) Severe PAH -IV Lasix today, continue coreg, monitor urine output, weights -Heart failure team  following Continue treprostinil, riociguat and opsumit  ILD (interstitial lung disease) ([place]) -seen by rheumatology and pulm, progressive based on recent CT 2/10 -? CREST, but was told by rheumatologist didn't think so per patient -Will request pulmonary eval  Anemia in chronic kidney disease- (present on admission) Seen by oncology with negative w/u and only finding of CKD Receives retacrit q 4 weeks, last injection 09/06/21.  Baseline has been 8-9.  -Denies any overt bleeding, transfused 1 unit of PRBC for hemoglobin of 7.2 yesterday, anemia panel with severe iron deficiency, she denies any overt bleeding, start IV iron, not a great candidate for endoscopic evaluation with chronic respiratory failure  Type 2 diabetes mellitus ([place]) a1c in 2020 was 7.3 -CBG stable, continue sliding scale  Acute renal failure superimposed on stage 3b chronic kidney disease ([place]) - Given gentle IV fluids on admission, creatinine down to 1.1  -Monitor with diuresis  CAD/HLD- (present on admission) S/p successful atherectomy with bifurcation stenting of proximal LAD and D2 05/15/18.  Repeat LHC in 3/20 showed nonobstructive disease. -continue ASA and lipitor -plavix held due to anemia   History of CVA (cerebrovascular accident) Continue ASA/statin   Anxiety and depression- (present on admission) Continue remeron and xanax prn   Hypertension- (present on admission) Well controlled, continue coreg   DVT prophylaxis:  Lovenox Code Status: DNR Family Communication: Daughters at bedside yesterday Disposition Plan: To be determined   Consultants:  Cardiology  Procedures:   Antimicrobials:    Objective: Vitals:   09/26/21 0419 09/26/21 0619 09/26/21 0730 09/26/21 1103  BP: (!) 141/57  (!) 149/65 (!) 142/57  Pulse: 92 88 96 93  Resp: _0 Temp: 98.2 F (  36.8 C)  98 F (36.7 C) 98.1 F (36.7 C)  TempSrc: Oral  Oral Oral  SpO2: 98%  93% 94%  Weight:      Height:         Intake/Output Summary (Last 24 hours) at 09/26/2021 1127 Last data filed at 09/26/2021 1109 Gross per 24 hour  Intake 1280.83 ml  Output 1950 ml  Net -669.17 ml   Filed Weights   09/24/21 1600 09/25/21 0500 09/26/21 0356  Weight: 54.7 kg 54.7 kg 54.6 kg    Examination:  General exam: Frail chronically ill elderly female laying in bed, AAOx3, hard of hearing CVS: S1-S2, regular rate rhythm Lungs: Fine and coarse crackles at both bases Abdomen: Soft, nontender, bowel sounds present Extremities: No edema  Skin: No rashes Psychiatry:  Mood & affect appropriate.     Data Reviewed:   CBC: Recent Labs  Lab 09/24/21 1344 09/24/21 2234 09/25/21 0320 09/25/21 2347  WBC 7.6 6.6 5.8  --   NEUTROABS 4.9  --   --   --   HGB 7.6* 7.3* 7.2* 9.5*  HCT 27.5* 24.9* 25.4* 31.8*  MCV 89.3 87.4 88.2  --   PLT 378 314 305  --    Basic Metabolic Panel: Recent Labs  Lab 09/24/21 1344 09/24/21 1700 09/25/21 0320 09/26/21 0344  NA 134*  --  138 141  K 4.7  --  3.9 3.6  CL 97*  --  103 102  CO2 27  --  26 29  GLUCOSE 145*  --  101* 109*  BUN 66*  --  61* 41*  CREATININE 1.67*  --  1.44* 1.12*  CALCIUM 8.4*  --  8.3* 8.6*  MG  --  2.1  --   --    GFR: Estimated Creatinine Clearance: 28 mL/min (A) (by C-G formula based on SCr of 1.12 mg/dL (H)). Liver Function Tests: Recent Labs  Lab 09/24/21 1344 09/24/21 1743  AST 21 22  ALT 30 31  ALKPHOS 46 47  BILITOT 0.2* 0.2*  PROT 5.8* 6.2*  ALBUMIN 2.6* 2.6*   No results for input(s): LIPASE, AMYLASE in the last 168 hours. No results for input(s): AMMONIA in the last 168 hours. Coagulation Profile: No results for input(s): INR, PROTIME in the last 168 hours. Cardiac Enzymes: No results for input(s): CKTOTAL, CKMB, CKMBINDEX, TROPONINI in the last 168 hours. BNP (last 3 results) No results for input(s): PROBNP in the last 8760 hours. HbA1C: No results for input(s): HGBA1C in the last 72 hours. CBG: Recent Labs  Lab  09/25/21 0609 09/25/21 1129 09/25/21 1654 09/25/21 2045 09/26/21 0637  GLUCAP 98 160* 119* 144* 123*   Lipid Profile: No results for input(s): CHOL, HDL, LDLCALC, TRIG, CHOLHDL, LDLDIRECT in the last 72 hours. Thyroid Function Tests: No results for input(s): TSH, T4TOTAL, FREET4, T3FREE, THYROIDAB in the last 72 hours. Anemia Panel: Recent Labs    09/25/21 0320 09/25/21 0730 09/25/21 0800  VITAMINB12  --   --  1,495*  FOLATE  --  55.8  --   FERRITIN  --   --  12  TIBC  --   --  326  IRON  --   --  23*  RETICCTPCT 3.3*  --   --    Urine analysis:    Component Value Date/Time   COLORURINE YELLOW 11/18/2020 [place] 11/18/2020 1235   LABSPEC 1.012 11/18/2020 1235   PHURINE 6.0 11/18/2020 1235   [place] 11/18/2020 1235  HGBUR NEGATIVE 11/18/2020 [place] 11/18/2020 1235   KETONESUR NEGATIVE 11/18/2020 1235   PROTEINUR NEGATIVE 11/18/2020 1235   UROBILINOGEN 1.0 04/04/2012 1310   NITRITE NEGATIVE 11/18/2020 1235   LEUKOCYTESUR NEGATIVE 11/18/2020 1235   Sepsis Labs: _0 (procalcitonin:4,lacticidven:4)  ) Recent Results (from the past 240 hour(s))  Resp Panel by RT-PCR (Flu A&B, Covid) Nasopharyngeal Swab     Status: None   Collection Time: 09/24/21  2:07 PM   Specimen: Nasopharyngeal Swab; Nasopharyngeal(NP) swabs in vial transport medium  Result Value Ref Range Status   SARS Coronavirus 2 by RT PCR NEGATIVE NEGATIVE Final    Comment: (NOTE) SARS-CoV-2 target nucleic acids are NOT DETECTED.  The SARS-CoV-2 RNA is generally detectable in upper respiratory specimens during the acute phase of infection. The lowest concentration of SARS-CoV-2 viral copies this assay can detect is 138 copies/mL. A negative result does not preclude SARS-Cov-2 infection and should not be used as the sole basis for treatment or other patient management decisions. A negative result may occur with  improper specimen collection/handling,  submission of specimen other than nasopharyngeal swab, presence of viral mutation(s) within the areas targeted by this assay, and inadequate number of viral copies(<138 copies/mL). A negative result must be combined with clinical observations, patient history, and epidemiological information. The expected result is Negative.  Fact Sheet for Patients:  EntrepreneurPulse.com.au  Fact Sheet for Healthcare Providers:  IncredibleEmployment.be  This test is no t yet approved or cleared by the [place] FDA and  has been authorized for detection and/or diagnosis of SARS-CoV-2 by FDA under an Emergency Use Authorization (EUA). This EUA will remain  in effect (meaning this test can be used) for the duration of the COVID-19 declaration under Section 564(b)(1) of the Act, 21 U.S.C.section 360bbb-3(b)(1), unless the authorization is terminated  or revoked sooner.       Influenza A by PCR NEGATIVE NEGATIVE Final   Influenza B by PCR NEGATIVE NEGATIVE Final    Comment: (NOTE) The Xpert Xpress SARS-CoV-2/FLU/RSV plus assay is intended as an aid in the diagnosis of influenza from Nasopharyngeal swab specimens and should not be used as a sole basis for treatment. Nasal washings and aspirates are unacceptable for Xpert Xpress SARS-CoV-2/FLU/RSV testing.  Fact Sheet for Patients: EntrepreneurPulse.com.au  Fact Sheet for Healthcare Providers: IncredibleEmployment.be  This test is not yet approved or cleared by the [place] FDA and has been authorized for detection and/or diagnosis of SARS-CoV-2 by FDA under an Emergency Use Authorization (EUA). This EUA will remain in effect (meaning this test can be used) for the duration of the COVID-19 declaration under Section 564(b)(1) of the Act, 21 U.S.C. section 360bbb-3(b)(1), unless the authorization is terminated or revoked.  Performed at [facility], [place] [address], [place], [place] [address]   Respiratory (~20 pathogens) panel by PCR     Status: None   Collection Time: 09/24/21  2:07 PM   Specimen: Nasopharyngeal Swab; Respiratory  Result Value Ref Range Status   Adenovirus NOT DETECTED NOT DETECTED Final   Coronavirus 229E NOT DETECTED NOT DETECTED Final    Comment: (NOTE) The Coronavirus on the Respiratory Panel, DOES NOT test for the novel  Coronavirus (2019 nCoV)    Coronavirus HKU1 NOT DETECTED NOT DETECTED Final   Coronavirus NL63 NOT DETECTED NOT DETECTED Final   Coronavirus OC43 NOT DETECTED NOT DETECTED Final   Metapneumovirus NOT DETECTED NOT DETECTED Final   Rhinovirus / Enterovirus NOT DETECTED NOT DETECTED Final  Influenza A NOT DETECTED NOT DETECTED Final   Influenza B NOT DETECTED NOT DETECTED Final   Parainfluenza Virus 1 NOT DETECTED NOT DETECTED Final   Parainfluenza Virus 2 NOT DETECTED NOT DETECTED Final   Parainfluenza Virus 3 NOT DETECTED NOT DETECTED Final   Parainfluenza Virus 4 NOT DETECTED NOT DETECTED Final   Respiratory Syncytial Virus NOT DETECTED NOT DETECTED Final   Bordetella pertussis NOT DETECTED NOT DETECTED Final   Bordetella Parapertussis NOT DETECTED NOT DETECTED Final   Chlamydophila pneumoniae NOT DETECTED NOT DETECTED Final   Mycoplasma pneumoniae NOT DETECTED NOT DETECTED Final    Comment: Performed at [facility], [place] [address], [place], [place] [address]  Culture, blood (routine x 2)     Status: None (Preliminary result)   Collection Time: 09/24/21  5:19 PM   Specimen: BLOOD  Result Value Ref Range Status   Specimen Description BLOOD BLOOD LEFT ARM  Final   Special Requests   Final    BOTTLES DRAWN AEROBIC ONLY Blood Culture adequate volume   Culture   Final    NO GROWTH 2 DAYS Performed at [facility], [place] [address] [place], [place] [address]    Report Status PENDING  Incomplete  Culture, blood (routine x 2)     Status: None (Preliminary result)   Collection Time:  09/24/21  5:31 PM   Specimen: BLOOD  Result Value Ref Range Status   Specimen Description BLOOD LEFT ANTECUBITAL  Final   Special Requests   Final    BOTTLES DRAWN AEROBIC ONLY Blood Culture adequate volume   Culture  Setup Time   Final    GRAM POSITIVE COCCI IN CLUSTERS AEROBIC BOTTLE ONLY CRITICAL RESULT CALLED TO, READ BACK BY AND VERIFIED WITH: Vena Austria E.MARTIN ON [id number] AT [id number] BY E.PARRISH    Culture   Final    GRAM POSITIVE COCCI IDENTIFICATION TO FOLLOW Performed at [facility], [place] [address], [place], [place] [address]    Report Status PENDING  Incomplete  Blood Culture ID Panel (Reflexed)     Status: Abnormal   Collection Time: 09/24/21  5:31 PM  Result Value Ref Range Status   Enterococcus faecalis NOT DETECTED NOT DETECTED Final   Enterococcus Faecium NOT DETECTED NOT DETECTED Final   Listeria monocytogenes NOT DETECTED NOT DETECTED Final   Staphylococcus species DETECTED (A) NOT DETECTED Final    Comment: CRITICAL RESULT CALLED TO, READ BACK BY AND VERIFIED WITH: PHARM D E.MARTIN ON [id number] AT 1405 BY E.PARRISH    Staphylococcus aureus (BCID) NOT DETECTED NOT DETECTED Final   Staphylococcus epidermidis NOT DETECTED NOT DETECTED Final   Staphylococcus lugdunensis NOT DETECTED NOT DETECTED Final   Streptococcus species NOT DETECTED NOT DETECTED Final   Streptococcus agalactiae NOT DETECTED NOT DETECTED Final   Streptococcus pneumoniae NOT DETECTED NOT DETECTED Final   Streptococcus pyogenes NOT DETECTED NOT DETECTED Final   A.calcoaceticus-baumannii NOT DETECTED NOT DETECTED Final   Bacteroides fragilis NOT DETECTED NOT DETECTED Final   Enterobacterales NOT DETECTED NOT DETECTED Final   Enterobacter cloacae complex NOT DETECTED NOT DETECTED Final   Escherichia coli NOT DETECTED NOT DETECTED Final   Klebsiella aerogenes NOT DETECTED NOT DETECTED Final   Klebsiella oxytoca NOT DETECTED NOT DETECTED Final   Klebsiella pneumoniae NOT DETECTED NOT DETECTED  Final   Proteus species NOT DETECTED NOT DETECTED Final   Salmonella species NOT DETECTED NOT DETECTED Final   Serratia marcescens NOT DETECTED NOT DETECTED Final  Haemophilus influenzae NOT DETECTED NOT DETECTED Final   Neisseria meningitidis NOT DETECTED NOT DETECTED Final   Pseudomonas aeruginosa NOT DETECTED NOT DETECTED Final   Stenotrophomonas maltophilia NOT DETECTED NOT DETECTED Final   Candida albicans NOT DETECTED NOT DETECTED Final   Candida auris NOT DETECTED NOT DETECTED Final   Candida glabrata NOT DETECTED NOT DETECTED Final   Candida krusei NOT DETECTED NOT DETECTED Final   Candida parapsilosis NOT DETECTED NOT DETECTED Final   Candida tropicalis NOT DETECTED NOT DETECTED Final   Cryptococcus neoformans/gattii NOT DETECTED NOT DETECTED Final    Comment: Performed at [facility], [place] [address] [place], [place] [address]     Radiology Studies: CT CHEST WO CONTRAST  Result Date: 09/25/2021 CLINICAL DATA:  Pneumonia EXAM: CT CHEST WITHOUT CONTRAST TECHNIQUE: Multidetector CT imaging of the chest was performed following the standard protocol without IV contrast. RADIATION DOSE REDUCTION: This exam was performed according to the departmental dose-optimization program which includes automated exposure control, adjustment of the mA and/or kV according to patient size and/or use of iterative reconstruction technique. COMPARISON:  09/14/2021 FINDINGS: Cardiovascular: Heart is enlarged in size. Coronary artery calcifications are seen. There is ectasia of main pulmonary artery measuring 3.9 cm suggesting pulmonary arterial hypertension. Mediastinum/Nodes: There are slightly enlarged lymph nodes in mediastinum with no significant change. Left lobe of thyroid is enlarged with multiple nodules. Lungs/Pleura: Centrilobular emphysema is seen. There is interval increase in patchy infiltrates in both lower lung fields. There are small linear densities in right middle lobe and lingula with  interval worsening. There is linear density in the lumen of trachea, possibly mucous. There is interval appearance of small bilateral pleural effusions, more so on the right side. There is no pneumothorax. Upper Abdomen: There are multiple low-density lesions of varying sizes in the liver with no significant interval change. Small ascites is present Musculoskeletal: Compression fracture of upper endplate of body of L1 vertebra has not changed. IMPRESSION: COPD. There is interval increase in infiltrates in both lungs, especially in the lower lung fields suggesting atelectasis/pneumonia. There is interval appearance of small bilateral pleural effusions, more so on the right side. Cardiomegaly. Coronary artery calcifications are seen. Pulmonary arterial hypertension. There are multiple low-density lesions in the liver with no significant change suggesting possible cysts or hemangiomas. Small ascites. Left lobe of thyroid is enlarged with multiple nodules. Follow-up thyroid sonogram may be considered. Electronically Signed   By: Elmer Picker M.D.   On: 09/25/2021 13:55   DG Chest Port 1 View  Result Date: 09/24/2021 CLINICAL DATA:  Shortness of breath EXAM: PORTABLE CHEST 1 VIEW COMPARISON:  09/14/2021 FINDINGS: Stable cardiomegaly. Aortic atherosclerosis. Patchy right lower lobe airspace opacity. Small right pleural effusion. Left lung base appears clear. No pneumothorax. Bony demineralization. IMPRESSION: Patchy right lower lobe airspace opacity and small right pleural effusion. Findings most suggestive of pneumonia. Electronically Signed   By: Davina Poke D.O.   On: 09/24/2021 14:35   ECHOCARDIOGRAM COMPLETE  Result Date: 09/25/2021    ECHOCARDIOGRAM REPORT   Patient Name:   Mekenzie GIANELLA CHISMAR Date of Exam: 09/25/2021 Medical Rec #:  [id number]      Height:       62.0 in Accession #:    [id number]     Weight:       120.6 lb Date of Birth:  03/16/1932       BSA:          1.542 m Patient Age:    35  years        BP:           112/35 mmHg Patient Gender: F              HR:           87 bpm. Exam Location:  Inpatient Procedure: 2D Echo, Cardiac Doppler and Color Doppler Indications:    CHF  History:        Patient has prior history of Echocardiogram examinations. CAD,                 Pulmonary HTN; Risk Factors:Hypertension and Diabetes.  Sonographer:    Jyl Heinz Referring Phys: [id number] [place]  1. Left ventricular ejection fraction, by estimation, is 55 to 60%. The left ventricle has normal function. The left ventricle has no regional wall motion abnormalities. There is mild concentric left ventricular hypertrophy. Left ventricular diastolic parameters are consistent with Grade I diastolic dysfunction (impaired relaxation). There is the interventricular septum is flattened in systole, consistent with right ventricular pressure overload.  2. Right ventricular systolic function is mildly reduced. The right ventricular size is severely enlarged. Mildly increased right ventricular wall thickness. There is severely elevated pulmonary artery systolic pressure. The estimated right ventricular systolic pressure is 90.5 mmHg.  3. The mitral valve is normal in structure. No evidence of mitral valve regurgitation.  4. The aortic valve was not well visualized. There is mild calcification of the aortic valve. There is mild thickening of the aortic valve. Aortic valve regurgitation is not visualized. Aortic valve sclerosis/calcification is present, without any evidence of aortic stenosis. Comparison(s): No significant change from prior study. Prior images reviewed side by side. PA pressure was underestimated on the previous study due to poor TR jet. FINDINGS  Left Ventricle: Left ventricular ejection fraction, by estimation, is 55 to 60%. The left ventricle has normal function. The left ventricle has no regional wall motion abnormalities. The left ventricular internal cavity size was normal in size. There is   mild concentric left ventricular hypertrophy. The interventricular septum is flattened in systole, consistent with right ventricular pressure overload. Left ventricular diastolic parameters are consistent with Grade I diastolic dysfunction (impaired relaxation). Normal left ventricular filling pressure. Right Ventricle: The right ventricular size is severely enlarged. Mildly increased right ventricular wall thickness. Right ventricular systolic function is mildly reduced. There is severely elevated pulmonary artery systolic pressure. The tricuspid regurgitant velocity is 3.51 m/s, and with an assumed right atrial pressure of 15 mmHg, the estimated right ventricular systolic pressure is 02.5 mmHg. Left Atrium: Left atrial size was normal in size. Right Atrium: Right atrial size was normal in size. Pericardium: There is no evidence of pericardial effusion. Mitral Valve: The mitral valve is normal in structure. No evidence of mitral valve regurgitation. Tricuspid Valve: The tricuspid valve is normal in structure. Tricuspid valve regurgitation is mild. Aortic Valve: The aortic valve was not well visualized. There is mild calcification of the aortic valve. There is mild thickening of the aortic valve. Aortic valve regurgitation is not visualized. Aortic valve sclerosis/calcification is present, without any evidence of aortic stenosis. Aortic valve peak gradient measures 14.3 mmHg. Pulmonic Valve: The pulmonic valve was not well visualized. Pulmonic valve regurgitation is mild. Aorta: The aortic root and ascending aorta are structurally normal, with no evidence of dilitation. IAS/Shunts: No atrial level shunt detected by color flow Doppler.  LEFT VENTRICLE PLAX 2D LVIDd:         4.21 cm  Diastology LVIDs:         2.62 cm      LV e' lateral:   7.83 cm/s LV PW:         1.20 cm      LV E/e' lateral: 9.8 LV IVS:        1.17 cm LVOT diam:     2.00 cm LV SV:         76 LV SV Index:   49 LVOT Area:     3.14 cm  LV Volumes  (MOD) LV vol d, MOD A2C: 107.0 ml LV vol d, MOD A4C: 86.2 ml LV vol s, MOD A2C: 45.6 ml LV vol s, MOD A4C: 36.2 ml LV SV MOD A2C:     61.4 ml LV SV MOD A4C:     86.2 ml LV SV MOD BP:      57.5 ml RIGHT VENTRICLE RV Basal diam:  4.29 cm RV Mid diam:    5.28 cm RV S prime:     9.25 cm/s TAPSE (M-mode): 1.2 cm LEFT ATRIUM             Index        RIGHT ATRIUM           Index LA diam:        3.50 cm 2.27 cm/m   RA Area:     17.70 cm LA Vol (A2C):   40.1 ml 26.01 ml/m  RA Volume:   51.80 ml  33.60 ml/m LA Vol (A4C):   35.4 ml 22.96 ml/m LA Biplane Vol: 37.9 ml 24.58 ml/m  AORTIC VALVE                 PULMONIC VALVE AV Area (Vmax): 1.91 cm     PV Vmax:       2.19 m/s AV Vmax:        189.00 cm/s  PV Peak grad:  19.2 mmHg AV Peak Grad:   14.3 mmHg LVOT Vmax:      115.00 cm/s LVOT Vmean:     81.300 cm/s LVOT VTI:       0.242 m  AORTA Ao Root diam: 3.20 cm Ao Asc diam:  3.30 cm MITRAL VALVE                TRICUSPID VALVE MV Area (PHT): 3.65 cm     TR Peak grad:   49.3 mmHg MV Decel Time: 208 msec     TR Vmax:        351.00 cm/s MV E velocity: 77.00 cm/s MV A velocity: 135.00 cm/s  SHUNTS MV E/A ratio:  0.57         Systemic VTI:  0.24 m                             Systemic Diam: 2.00 cm Mihai Croitoru MD Electronically signed by Sanda Klein MD Signature Date/Time: 09/25/2021/12:07:19 PM    Final      Scheduled Meds:  sodium chloride   Intravenous Once   aspirin EC  81 mg Oral Daily   atorvastatin  80 mg Oral Daily   azithromycin  500 mg Oral QPM   carvedilol  3.125 mg Oral BID WC   cyclobenzaprine  10 mg Oral q AM   enoxaparin (LOVENOX) injection  30 mg Subcutaneous Q24H   insulin aspart  0-9 Units Subcutaneous TID WC   macitentan  10 mg Oral Daily  mirtazapine  30 mg Oral QHS   polyvinyl alcohol  1 drop Both Eyes BID   Riociguat  2.5 mg Oral TID   sodium chloride flush  3 mL Intravenous Q12H   Treprostinil Diolamine ER  0.125 mg Oral TID   And   Treprostinil Diolamine ER  0.75 mg Oral TID    Continuous Infusions:  sodium chloride     cefTRIAXone (ROCEPHIN)  IV 1 g (09/25/21 2022)     LOS: 2 days    Time spent: 34mn    PDomenic Polite MD [facility]   09/26/2021, 11:27 AM

## 2021-09-26 NOTE — Consult Note (Signed)
NAME:  [name], MRN:  [id number], DOB:  1932/07/26, LOS: 2 ADMISSION DATE:  09/24/2021, CONSULTATION DATE:  2/22 REFERRING MD:  Dr. Broadus Troi Florendo, CHIEF COMPLAINT:  ARF w/ hypoxia   History of Present Illness:  Patient is a 88 year old female with pertinent PMH of chronic respiratory failure on 4L O2 at home, ILD (patient of Dr. Chase Caller), severe PAH, chronic diastolic CHF presents to [facility] ED on 2/20 with weakness and worsening hypoxia.  Patient is DNR. Patient was having progressive weakness over the past week.  She was also requiring increased O2 requirements.  Her sats were in the 80s on 5L O2.  CTA chest on 2/10 with progressive interstitial lung disease.  Upon arrival to [facility] ED on 2/20, patient hypoxic and placed on NRB which was then weaned to HFNC 12 L.  Hgb 7.3, BUN 66, creatinine 1.6, BNP 695.  CXR showing RLL opacity.  Rocephin and azithromycin started.  HF team consulted; IV Lasix given.  CT chest on 2/21 with basilar infiltrates (likely pneumonia).  Patient remains on HFNC.  PCCM consulted to weigh in on prognosis with chronic lung disease.  Pertinent  Medical History   Past Medical History:  Diagnosis Date   Anemia    Anxiety    Arthritis    osteo; "mostly hands, knees, probably back too" (12/09/2016)   Chronic lower back pain    Complication of anesthesia 2011   resp distress -on vent after surgery   Coronary artery disease    Depression    Diabetes ([place])    Esophageal stricture    GERD (gastroesophageal reflux disease)    Headache    out grew them   High cholesterol    History of blood transfusion 2011; ?03/2012   "related to ORs" (12/09/2016)   Hypertension    Intestinal obstruction (HCC)    Macular degeneration of left eye    dx over 55 yrs ago.....hasn't changed much   Shingles    Squamous carcinoma 2013   squamas cell on scalp--took 14 radiation tx--2013   Stroke [facility]) 1967   Mini stroke;  No lasting deficits   Type II diabetes mellitus ([place]) dx'd 11/2016      Significant Hospital Events: Including procedures, antibiotic start and stop dates in addition to other pertinent events   2/20: admitted to [facility] w/ hypoxic resp failure  Interim History / Subjective:  Patient sitting up in bed No resp distress Denies any pain or discomfort On 8 L HFNC w/ sats 94%  Objective   Blood pressure (!) 142/57, pulse 93, temperature 98.1 F (36.7 C), temperature source Oral, resp. rate 19, height _0  (1.575 m), weight 54.6 kg, SpO2 94 %.        Intake/Output Summary (Last 24 hours) at 09/26/2021 1150 Last data filed at 09/26/2021 1109 Gross per 24 hour  Intake 1160.83 ml  Output 1950 ml  Net -789.17 ml   Filed Weights   09/24/21 1600 09/25/21 0500 09/26/21 0356  Weight: 54.7 kg 54.7 kg 54.6 kg    Examination: General:   NAD; on HFNC HEENT: MM pink/moist; hard of hearing; HFNC in place Neuro: Aox3; MAE CV: s1s2, no m/r/g PULM:  dim clear BS bilaterally; HFNC 8 L sats 94% GI: soft, bsx4 active  Extremities: warm/dry, no edema  Skin: no rashes or lesions    Resolved Hospital Problem list     Assessment & Plan:  Ms. Bolin is seen in consultation at the request of Dr. Broadus Elayna Tobler for further evaluation  and management of aspiration pneumonia in the setting of ILD, as well as discussion of disease progression/prognosis.   CAP versus Aspiration pneumonia Presented to [facility] ED 2/20 with increasing O2 requirement, hypoxia, weakness. CXR with RLL PNA, c/f aspiration given history of GERD/hiatal hernia. Extended RVP negative. History of ILD. - Continue ceftriaxone, azithromycin  - Obtain MRSA swab, low threshold to broaden antibiotics if positive - Consider additional testing ie: legionella, urine strep - F/u finalized BCx, resp Cx/TA as able - will send expectorated sputum - Pulmonary hygiene as below - Symptoms more consistent with PNA, less likely ILD flare   Interstitial lung disease Chronic hypoxemic respiratory failure, on HOT Diagnosed  with ILD 09/2021, followed by Dr. Chase Caller. HRCT 2/10 demonstrated progressive ILD with findings c/w UIP. Query scleroderma-related, as serologies previously suggested possible CREST syndrome (patient states her rheumatologist did not feel she had a rheumatologic diagnosis, per report). Last PFTs prior to 09/2021 were in 2019, demonstrating restriction with reduced DLCO. No evidence on ILD on standard CT Chest from 2019. - Continue supplemental O2 support, ongoing increased needs from baseline - Wean O2 for sat > 88% - Pulmonary hygiene; IS/flutter as indicated; mobilize - Ongoing discussion re: disease progression - PT/OT   Severe pulmonary arterial hypertension Chronic diastolic HF CAD Followed by Dr. Aundra Dubin. Some degree of HF since 02/2018 (Echo demonstrating mildly decreased LV/severely decreased RV systolic function); moderate PAH at that time. Empiric sildenafil started. RHC/LHC 05/2018 with subsequent complex PCI (atherectomy and DES to pLAD/ostial D2), redemonstrated moderate PAH. Opsumit started. Ongoing abnormal Echos through 2020-2021 with increasing hypoxemia/O2 requirement, prompting Pulmonary referral. Established with Ramaswamy; HRCT 2/10 demonstrated dilated pulmonic trunk (3.8cm diameter) c/w PAH. - Advanced Heart Failure team consulted on admission - Diuresis per AHF team - Continue Adempas, Opsumit, treprostinil - May benefit from sleep study, however per report patient refused  Best Practice (right click and "Reselect all SmartList Selections" daily)  Per primary  Labs   CBC: Recent Labs  Lab 09/24/21 1344 09/24/21 2234 09/25/21 0320 09/25/21 2347  WBC 7.6 6.6 5.8  --   NEUTROABS 4.9  --   --   --   HGB 7.6* 7.3* 7.2* 9.5*  HCT 27.5* 24.9* 25.4* 31.8*  MCV 89.3 87.4 88.2  --   PLT 378 314 305  --     Basic Metabolic Panel: Recent Labs  Lab 09/24/21 1344 09/24/21 1700 09/25/21 0320 09/26/21 0344  NA 134*  --  138 141  K 4.7  --  3.9 3.6  CL 97*  --  103  102  CO2 27  --  26 29  GLUCOSE 145*  --  101* 109*  BUN 66*  --  61* 41*  CREATININE 1.67*  --  1.44* 1.12*  CALCIUM 8.4*  --  8.3* 8.6*  MG  --  2.1  --   --    GFR: Estimated Creatinine Clearance: 28 mL/min (A) (by C-G formula based on SCr of 1.12 mg/dL (H)). Recent Labs  Lab 09/24/21 1344 09/24/21 1400 09/24/21 1700 09/24/21 2234 09/25/21 0320 09/26/21 0344  PROCALCITON  --   --  0.10  --  <0.10 <0.10  WBC 7.6  --   --  6.6 5.8  --   LATICACIDVEN  --  0.5  --   --   --   --     Liver Function Tests: Recent Labs  Lab 09/24/21 1344 09/24/21 1743  AST 21 22  ALT 30 31  ALKPHOS 46 47  BILITOT 0.2* 0.2*  PROT 5.8* 6.2*  ALBUMIN 2.6* 2.6*   No results for input(s): LIPASE, AMYLASE in the last 168 hours. No results for input(s): AMMONIA in the last 168 hours.  ABG    Component Value Date/Time   PHART 7.377 05/19/2018 1755   PCO2ART 36.3 05/19/2018 1755   PO2ART 50.4 (L) 05/19/2018 1755   HCO3 20.9 05/19/2018 1755   TCO2 27 05/14/2018 1141   TCO2 26 05/14/2018 1141   ACIDBASEDEF 3.4 (H) 05/19/2018 1755   O2SAT 82.5 05/19/2018 1755     Coagulation Profile: No results for input(s): INR, PROTIME in the last 168 hours.  Cardiac Enzymes: No results for input(s): CKTOTAL, CKMB, CKMBINDEX, TROPONINI in the last 168 hours.  HbA1C: Hgb A1c MFr Bld  Date/Time Value Ref Range Status  10/07/2018 02:03 AM 7.3 (H) 4.8 - 5.6 % Final    Comment:    (NOTE) Pre diabetes:          5.7%-6.4% Diabetes:              >6.4% Glycemic control for   <7.0% adults with diabetes   12/09/2016 01:03 PM 7.2 (H) 4.8 - 5.6 % Final    Comment:    (NOTE)         Pre-diabetes: 5.7 - 6.4         Diabetes: >6.4         Glycemic control for adults with diabetes: <7.0     CBG: Recent Labs  Lab 09/25/21 0609 09/25/21 1129 09/25/21 1654 09/25/21 2045 09/26/21 0637  GLUCAP 98 160* 119* 144* 123*    Review of Systems:   Review of Systems  Constitutional:  Negative for fever.   Respiratory:  Negative for cough, sputum production, shortness of breath and wheezing.   Cardiovascular:  Negative for chest pain.    Past Medical History:  She,  has a past medical history of Anemia, Anxiety, Arthritis, Chronic lower back pain, Complication of anesthesia (2011), Coronary artery disease, Depression, Diabetes ([place]), Esophageal stricture, GERD (gastroesophageal reflux disease), Headache, High cholesterol, History of blood transfusion (2011; ?03/2012), Hypertension, Intestinal obstruction ([place]), Macular degeneration of left eye, Shingles, Squamous carcinoma (2013), Stroke ([place]) (1967), and Type II diabetes mellitus ([place]) (dx'd 11/2016).   Surgical History:   Past Surgical History:  Procedure Laterality Date   ABDOMINAL HYSTERECTOMY     APPENDECTOMY     BACK SURGERY     BALLOON DILATION N/A 06/24/2020   Procedure: BALLOON DILATION;  Surgeon: Clarene Essex, MD;  Location: [place];  Service: Endoscopy;  Laterality: N/A;   CATARACT EXTRACTION, BILATERAL Bilateral    CHOLECYSTECTOMY     COLON SURGERY     COLONOSCOPY     CORONARY ATHERECTOMY N/A 05/15/2018   Procedure: CORONARY ATHERECTOMY;  Surgeon: Leonie Man, MD;  Location: [place] CV LAB;  Service: Cardiovascular;  Laterality: N/A;   CORONARY STENT INTERVENTION N/A 05/15/2018   Procedure: CORONARY STENT INTERVENTION;  Surgeon: Leonie Man, MD;  Location: [place] CV LAB;  Service: Cardiovascular;  Laterality: N/A;   ESOPHAGOGASTRODUODENOSCOPY N/A 06/24/2020   Procedure: ESOPHAGOGASTRODUODENOSCOPY (EGD);  Surgeon: Clarene Essex, MD;  Location: [place];  Service: Endoscopy;  Laterality: N/A;   ESOPHAGOGASTRODUODENOSCOPY (EGD) WITH ESOPHAGEAL DILATION  "several times"   ESOPHAGOGASTRODUODENOSCOPY (EGD) WITH PROPOFOL N/A 12/31/2016   Procedure: ESOPHAGOGASTRODUODENOSCOPY (EGD) WITH PROPOFOL;  Surgeon: Wilford Corner, MD;  Location: [place];  Service: Endoscopy;  Laterality: N/A;   EYE SURGERY      FRACTURE SURGERY  IR FLUORO GUIDE CV LINE RIGHT  01/02/2017   IR US GUIDE VASC ACCESS RIGHT  01/02/2017   JOINT REPLACEMENT     LEFT HEART CATH AND CORONARY ANGIOGRAPHY N/A 10/09/2018   Procedure: LEFT HEART CATH AND CORONARY ANGIOGRAPHY;  Surgeon: Larey Dresser, MD;  Location: [place] CV LAB;  Service: Cardiovascular;  Laterality: N/A;   LUMBAR DISC SURGERY  10/25/2014   Right L4-L5 removal of seven free fragments of disk mostly posterolaterally.  Lysis of adhesion.  Microscope/notes 10/26/2014   LUMBAR LAMINECTOMY/DECOMPRESSION MICRODISCECTOMY Right 08/09/2014   Procedure: Right Lumbar Four to Five Microdiskectomy;  Surgeon: Floyce Stakes, MD;  Location: [facility];  Service: Neurosurgery;  Laterality: Right;  Right L4-5 Microdiskectomy   ORIF PERIPROSTHETIC FRACTURE  03/31/2012   Procedure: OPEN REDUCTION INTERNAL FIXATION (ORIF) PERIPROSTHETIC FRACTURE;  Surgeon: Mauri Pole, MD;  Location: [facility];  Service: Orthopedics;  Laterality: Left;  Open reduction internal fixation Left distal femur periprosthetic fracture   RIGHT/LEFT HEART CATH AND CORONARY ANGIOGRAPHY N/A 05/14/2018   Procedure: RIGHT/LEFT HEART CATH AND CORONARY ANGIOGRAPHY;  Surgeon: Larey Dresser, MD;  Location: [place] CV LAB;  Service: Cardiovascular;  Laterality: N/A;   SMALL INTESTINE SURGERY  2011   "really bad blockage; went into respiratory distress and was on ventilator after surgery; [facility]"   SQUAMOUS CELL CARCINOMA EXCISION  ~ 2012   S/P "cut off her head then 15 radiation txs"    TOTAL KNEE ARTHROPLASTY Bilateral    bilateral     Social History:   reports that she quit smoking about 30 years ago. Her smoking use included cigarettes. She has a 1.00 pack-year smoking history. She has never used smokeless tobacco. She reports that she does not drink alcohol and does not use drugs.   Family History:  Her family history is not on file.   Allergies Allergies  Allergen Reactions    Sulfa Antibiotics Swelling    Mouth and tongue swelling     Home Medications  Prior to Admission medications   Medication Sig Start Date End Date Taking? Authorizing Provider  ALPRAZolam Duanne Moron) 0.5 MG tablet Take 0.5 mg by mouth daily as needed for anxiety.  09/14/19  Yes [provider]  aspirin EC 81 MG tablet Take 1 tablet (81 mg total) by mouth daily. 05/16/18  Yes Bhagat, Bhavinkumar, PA  atorvastatin (LIPITOR) 80 MG tablet Take 1 tablet (80 mg total) by mouth daily. 12/06/20  Yes Larey Dresser, MD  carvedilol (COREG) 3.125 MG tablet TAKE 1 TABLET BY MOUTH TWICE DAILY WITH A MEAL Patient taking differently: Take 3.125 mg by mouth 2 (two) times daily with a meal. 09/03/21  Yes Larey Dresser, MD  cyclobenzaprine (FLEXERIL) 10 MG tablet Take 10 mg by mouth in the morning.   Yes [provider]  cycloSPORINE (RESTASIS) 0.05 % ophthalmic emulsion Place 1 drop into both eyes 2 (two) times daily. 08/07/21  Yes [provider]  Epoetin Alfa-epbx (RETACRIT IJ) Inject 1 Dose as directed every 21 ( twenty-one) days.   Yes [provider]  furosemide (LASIX) 40 MG tablet Take 1 tablet (40 mg total) by mouth 2 (two) times daily. 08/24/21  Yes Larey Dresser, MD  gabapentin (NEURONTIN) 300 MG capsule Take 300 mg by mouth daily as needed (nerve pain).   Yes [provider]  lactose free nutrition (BOOST) LIQD Take 237 mLs by mouth 2 (two) times daily between meals.    Yes [provider]  Melatonin 10 MG TABS Take 10 mg by mouth at bedtime as needed (sleep).   Yes [provider]  mirtazapine (REMERON) 30 MG tablet Take 30 mg by mouth at bedtime.   Yes [provider]  OPSUMIT 10 MG tablet Take 1 tablet (10 mg total) by mouth daily. 02/21/21  Yes Larey Dresser, MD  Riociguat (ADEMPAS) 2.5 MG TABS Take 2.5 mg by mouth in the morning, at noon, and at bedtime. 07/24/21  Yes Larey Dresser, MD  traMADol-acetaminophen (ULTRACET)  37.5-325 MG tablet Take 2 tablets by mouth 4 (four) times daily as needed for pain. 05/07/18  Yes [provider]  Treprostinil Diolamine ER 0.125 MG TBCR Take 1 tablet by mouth in the morning, at noon, and at bedtime.   Yes [provider]  Treprostinil Diolamine ER 0.25 MG TBCR Take 0.75 mg by mouth in the morning, at noon, and at bedtime. Takes three 0.32m and one 0.1234mtablet (0.87547motal) three times daily   Yes [provider]  FEROSUL 325 (65 Fe) MG tablet Take 325 mg by mouth daily. Patient not taking: Reported on 09/24/2021 07/04/21   [provider]          JD PayRexene AgentBOrdlmonary & Critical Care 09/26/2021, 12:31 PM  Please see [email or web address] for pager details.  From 7A-7P if no response, please call [phone]. After hours, please call ELink [phone]

## 2021-09-26 NOTE — Assessment & Plan Note (Addendum)
Severe PAH -Diuresed with IV Lasix yesterday, continue coreg,  -CHF team following, volume status improved, plan to transition to oral Lasix  -continue treprostinil, riociguat and opsumit -Cardiology recommends continuing Lasix 40 mg twice daily at discharge and following up outpatient

## 2021-09-26 NOTE — Evaluation (Signed)
Occupational Therapy Evaluation Patient Details Name: [name] MRN: [id number] DOB: 12-08-1931 Today's Date: 09/26/2021   History of Present Illness 88 year old female with pertinent PMH of chronic respiratory failure on 4L O2 at home, ILD (patient of Dr. Chase Caller), severe PAH, chronic diastolic CHF presents to [facility] ED on 2/20 with weakness and worsening hypoxia.  CT Chest:There is interval increase in infiltrates in both lungs,  especially in the lower lung fields suggesting  atelectasis/pneumonia. There is interval appearance of small  bilateral pleural effusions, more so on the right side.   Clinical Impression   Patient admitted for the diagnosis above.  PTA she lives alone, with her daughter living right behind her, and does assist with community mobility and the occasional meal.  Otherwise, she is Mod I with all ADL/IADL at cane level.  Primary deficit is unsteadiness and decreased activity tolerance.  Currently she is needing light Min Guard for transfers, and supervision for ADL completion at sit/stand level.  OT will follow in the acute setting, but no post acute OT is anticipated.         Recommendations for follow up therapy are one component of a multi-disciplinary discharge planning process, led by the attending physician.  Recommendations may be updated based on patient status, additional functional criteria and insurance authorization.   Follow Up Recommendations  No OT follow up    Assistance Recommended at Discharge PRN  Patient can return home with the following      Functional Status Assessment  Patient has had a recent decline in their functional status and demonstrates the ability to make significant improvements in function in a reasonable and predictable amount of time.  Equipment Recommendations  None recommended by OT    Recommendations for Other Services       Precautions / Restrictions Precautions Precautions: Fall Precaution Comments: Watch  O2 Restrictions Weight Bearing Restrictions: No      Mobility Bed Mobility Overal bed mobility: Modified Independent                  Transfers Overall transfer level: Needs assistance   Transfers: Sit to/from Stand, Bed to chair/wheelchair/BSC Sit to Stand: Supervision     Step pivot transfers: Supervision, Min guard            Balance Overall balance assessment: Needs assistance Sitting-balance support: Feet supported Sitting balance-Leahy Scale: Good     Standing balance support: Single extremity supported Standing balance-Leahy Scale: Fair                             ADL either performed or assessed with clinical judgement   ADL Overall ADL's : At baseline                                       General ADL Comments: Generalized supervision for line management.  Feels a little unstady on her feet, and tired.     Vision Baseline Vision/History: 1 Wears glasses Patient Visual Report: No change from baseline       Perception Perception Perception: Within Functional Limits   Praxis Praxis Praxis: Intact    Pertinent Vitals/Pain Pain Assessment Pain Assessment: No/denies pain     Hand Dominance Right   Extremity/Trunk Assessment Upper Extremity Assessment Upper Extremity Assessment: Overall WFL for tasks assessed   Lower Extremity Assessment Lower Extremity Assessment: Defer to  PT evaluation   Cervical / Trunk Assessment Cervical / Trunk Assessment: Kyphotic   Communication Communication Communication: No difficulties;HOH   Cognition Arousal/Alertness: Awake/alert Behavior During Therapy: WFL for tasks assessed/performed Overall Cognitive Status: Within Functional Limits for tasks assessed                                       General Comments   HR to 108 with transfer    Exercises     Shoulder Instructions      Home Living Family/patient expects to be discharged to:: Private  residence Living Arrangements: Alone Available Help at Discharge: Family;Available 24 hours/day Type of Home: House Home Access: Level entry     Home Layout: One level     Bathroom Shower/Tub: Occupational psychologist: Standard Bathroom Accessibility: Yes How Accessible: Accessible via walker Home Equipment: [place] (2 wheels);Cane - single point          Prior Functioning/Environment Prior Level of Function : Independent/Modified Independent             Mobility Comments: Generally moves with a SPC ADLs Comments: No assist with ADL/IADL.  Daughter assists with community mobility and occasional meals.        OT Problem List: Decreased activity tolerance;Impaired balance (sitting and/or standing)      OT Treatment/Interventions: Self-care/ADL training;Therapeutic activities;Balance training    OT Goals(Current goals can be found in the care plan section) Acute Rehab OT Goals Patient Stated Goal: Hopefully get back home in a couple days OT Goal Formulation: With patient Time For Goal Achievement: 10/10/21 Potential to Achieve Goals: Good ADL Goals Pt Will Perform Grooming: with modified independence;standing Pt Will Perform Upper Body Dressing: with modified independence;standing Pt Will Perform Lower Body Dressing: with modified independence;sit to/from stand Pt Will Transfer to Toilet: with modified independence;regular height toilet;ambulating  OT Frequency: Min 2X/week    Co-evaluation              AM-PAC OT "6 Clicks" Daily Activity     Outcome Measure Help from another person eating meals?: None Help from another person taking care of personal grooming?: None Help from another person toileting, which includes using toliet, bedpan, or urinal?: [name] from another person bathing (including washing, rinsing, drying)?: [name] from another person to put on and taking off regular upper body clothing?: None Help  from another person to put on and taking off regular lower body clothing?: A Little 6 Click Score: 21   End of Session Equipment Utilized During Treatment: Oxygen Nurse Communication: Mobility status  Activity Tolerance: Patient tolerated treatment well Patient left: in chair;with call bell/phone within reach;with family/visitor present  OT Visit Diagnosis: Unsteadiness on feet (R26.81)                Time: 3235-5732 OT Time Calculation (min): 21 min Charges:  OT General Charges $OT Visit: 1 Visit OT Evaluation $OT Eval Moderate Complexity: 1 Mod  09/26/2021  RP, OTR/L  Acute Rehabilitation Services  Office:  [phone]   Metta Clines 09/26/2021, 5:12 PM

## 2021-09-27 DIAGNOSIS — J9621 Acute and chronic respiratory failure with hypoxia: Principal | ICD-10-CM

## 2021-09-27 LAB — CULTURE, BLOOD (ROUTINE X 2): Special Requests: ADEQUATE

## 2021-09-27 LAB — GLUCOSE, CAPILLARY
Glucose-Capillary: 132 mg/dL — ABNORMAL HIGH (ref 70–99)
Glucose-Capillary: 141 mg/dL — ABNORMAL HIGH (ref 70–99)
Glucose-Capillary: 187 mg/dL — ABNORMAL HIGH (ref 70–99)
Glucose-Capillary: 156 mg/dL — ABNORMAL HIGH (ref 70–99)

## 2021-09-27 LAB — CBC
HCT: 32.3 % — ABNORMAL LOW (ref 36.0–46.0)
Hemoglobin: 9.7 g/dL — ABNORMAL LOW (ref 12.0–15.0)
MCH: 25.5 pg — ABNORMAL LOW (ref 26.0–34.0)
MCHC: 30 g/dL (ref 30.0–36.0)
MCV: 84.8 fL (ref 80.0–100.0)
Platelets: 318 10*3/uL (ref 150–400)
RBC: 3.81 MIL/uL — ABNORMAL LOW (ref 3.87–5.11)
RDW: 16.6 % — ABNORMAL HIGH (ref 11.5–15.5)
WBC: 4.3 10*3/uL (ref 4.0–10.5)
nRBC: 0 % (ref 0.0–0.2)

## 2021-09-27 LAB — BASIC METABOLIC PANEL
Anion gap: 12 (ref 5–15)
BUN: 28 mg/dL — ABNORMAL HIGH (ref 8–23)
CO2: 28 mmol/L (ref 22–32)
Calcium: 9.1 mg/dL (ref 8.9–10.3)
Chloride: 103 mmol/L (ref 98–111)
Creatinine, Ser: 0.98 mg/dL (ref 0.44–1.00)
GFR, Estimated: 56 mL/min — ABNORMAL LOW (ref >60)
Glucose, Bld: 149 mg/dL — ABNORMAL HIGH (ref 70–99)
Potassium: 3.9 mmol/L (ref 3.5–5.1)
Sodium: 143 mmol/L (ref 135–145)

## 2021-09-27 MED ORDER — FUROSEMIDE 40 MG PO TABS
40.0000 mg | ORAL_TABLET | Freq: Two times a day (BID) | ORAL | Status: DC
  Administered 2021-09-27 – 2021-09-28 (×2): 40 mg via ORAL
  Filled 2021-09-27 (×2): qty 1

## 2021-09-27 MED ORDER — LOPERAMIDE HCL 2 MG PO CAPS
2.0000 mg | ORAL_CAPSULE | ORAL | Status: DC | PRN
  Administered 2021-09-28: 2 mg via ORAL
  Filled 2021-09-27 (×3): qty 1

## 2021-09-27 NOTE — Progress Notes (Signed)
PROGRESS NOTE    [name]  ENI:[id number] DOB: October 12, 1931 DOA: 09/24/2021 PCP: [name], PA-C  Brief Narrative: 89/F, chronically ill with chronic respiratory failure on 4 L home O2, severe PAH, interstitial lung disease, history of CAD, history of esophageal stricture, CVA, type 2 diabetes mellitus, chronic anemia was brought to the ED by her daughters on account of progressive weakness over the past week, also she was noted to be more hypoxic than usual,'s O2 sats in the 80s on 5 L O2, recently saw Dr. Jamey Reas with ILD, CT chest on 2/10 noted progressive interstitial lung disease. -In the ED she was more hypoxic, was placed on a nonrebreather mask, then transition to high flow nasal cannula 12 L, labs noted hemoglobin of 7.3, BUN 66, creatinine 1.6, BNP 695, chest x-ray?  Patchy right lower lobe airspace opacity, she was started on IV antibiotics and admitted  Subjective: -Reports that her breathing is unchanged, feels okay overall  Assessment and Plan:  * Acute on chronic respiratory failure with hypoxia ([place])- (present on admission) Has chronic hypoxic respiratory failure secondary to ILD and severe PAH on 4-5L O2 at baseline -Concern for progressive ILD based on recent CT 2/10,  -Repeat CT chest 2/21 with evidence of basilar infiltrates, consistent with pneumonia, continue IV ceftriaxone and azithromycin-day 3, history of known esophageal stricture, no symptoms of dysphagia at this time, SLP evaluation was unremarkable, switch to p.o. ABX tomorrow -Slowly making progress, O2 weaned down to 4-5L this morning -However I do think she has considerable disease burden, with advanced age, CHF, severe PAH, ILD, now pneumonia -Will request palliative care evaluation for goals of care, anticipate need for palliative care follow-up after discharge  Acute on chronic diastolic CHF (congestive heart failure) (HCC) Severe PAH -Diuresed with IV Lasix yesterday, continue coreg,  -CHF team  following, volume status improved, plan to transition to oral Lasix  -continue treprostinil, riociguat and opsumit  ILD (interstitial lung disease) ([place]) -seen by rheumatology and pulm, progressive based on recent CT 2/10 -? CREST, but was told by rheumatologist didn't think so per patient -Appreciate pulmonary input, started on prednisone yesterday, taper as outlined by Dr. Loanne Drilling  Anemia in chronic kidney disease- (present on admission) Seen by oncology with negative w/u and only finding of CKD Receives retacrit q 4 weeks, last injection 09/06/21.  Baseline has been 8-9.  -Denies any overt bleeding, transfused 1 unit of PRBC for hemoglobin of 7.2 yesterday, anemia panel with severe iron deficiency, she denies any overt bleeding, start IV iron, not a great candidate for endoscopic evaluation with chronic respiratory failure  Type 2 diabetes mellitus ([place]) a1c in 2020 was 7.3 -CBG stable, continue sliding scale  Acute renal failure superimposed on stage 3b chronic kidney disease ([place]) - Given gentle IV fluids on admission, creatinine down to 1.1  -Monitor with diuresis  CAD/HLD- (present on admission) S/p successful atherectomy with bifurcation stenting of proximal LAD and D2 05/15/18.  Repeat LHC in 3/20 showed nonobstructive disease. -continue ASA and lipitor -plavix held due to anemia   History of CVA (cerebrovascular accident) Continue ASA/statin   Anxiety and depression- (present on admission) Continue remeron and xanax prn   Hypertension- (present on admission) Well controlled, continue coreg   DVT prophylaxis:  Lovenox Code Status: DNR Family Communication: Daughters at bedside 2 days ago Disposition Plan: Home with home health services, likely in 48 hours  Consultants:  Cardiology, pulmonary  Procedures:   Antimicrobials:    Objective: Vitals:   09/27/21  0330 09/27/21 0336 09/27/21 0400 09/27/21 0722  BP: (!) 136/47  (!) 129/56 (!) 141/58  Pulse: (!) 112   (!) 103 (!) 105  Resp: _0 Temp: 97.7 F (36.5 C)   98 F (36.7 C)  TempSrc: Oral   Oral  SpO2: 96%   96%  Weight: 53.4 kg     Height:        Intake/Output Summary (Last 24 hours) at 09/27/2021 1025 Last data filed at 09/27/2021 1003 Gross per 24 hour  Intake 550 ml  Output 1350 ml  Net -800 ml   Filed Weights   09/25/21 0500 09/26/21 0356 09/27/21 0330  Weight: 54.7 kg 54.6 kg 53.4 kg    Examination:  General exam: Chronically ill frail elderly female sitting up in bed, AAOx3, hard of hearing HEENT: No JVD CVS: S1-S2, regular rhythm Lungs: Some coarse crackles bilaterally Abdomen: Soft, nontender, bowel sounds present Extremities: No edema Skin: No rashes on exposed skin Psychiatry:  Mood & affect appropriate.     Data Reviewed:   CBC: Recent Labs  Lab 09/24/21 1344 09/24/21 2234 09/25/21 0320 09/25/21 2347 09/27/21 0417  WBC 7.6 6.6 5.8  --  4.3  NEUTROABS 4.9  --   --   --   --   HGB 7.6* 7.3* 7.2* 9.5* 9.7*  HCT 27.5* 24.9* 25.4* 31.8* 32.3*  MCV 89.3 87.4 88.2  --  84.8  PLT 378 314 305  --  774   Basic Metabolic Panel: Recent Labs  Lab 09/24/21 1344 09/24/21 1700 09/25/21 0320 09/26/21 0344 09/27/21 0417  NA 134*  --  138 141 143  K 4.7  --  3.9 3.6 3.9  CL 97*  --  103 102 103  CO2 27  --  _1 GLUCOSE 145*  --  101* 109* 149*  BUN 66*  --  61* 41* 28*  CREATININE 1.67*  --  1.44* 1.12* 0.98  CALCIUM 8.4*  --  8.3* 8.6* 9.1  MG  --  2.1  --   --   --    GFR: Estimated Creatinine Clearance: 32 mL/min (by C-G formula based on SCr of 0.98 mg/dL). Liver Function Tests: Recent Labs  Lab 09/24/21 1344 09/24/21 1743  AST 21 22  ALT 30 31  ALKPHOS 46 47  BILITOT 0.2* 0.2*  PROT 5.8* 6.2*  ALBUMIN 2.6* 2.6*   No results for input(s): LIPASE, AMYLASE in the last 168 hours. No results for input(s): AMMONIA in the last 168 hours. Coagulation Profile: No results for input(s): INR, PROTIME in the last 168 hours. Cardiac  Enzymes: No results for input(s): CKTOTAL, CKMB, CKMBINDEX, TROPONINI in the last 168 hours. BNP (last 3 results) No results for input(s): PROBNP in the last 8760 hours. HbA1C: No results for input(s): HGBA1C in the last 72 hours. CBG: Recent Labs  Lab 09/26/21 0637 09/26/21 1209 09/26/21 1638 09/26/21 2130 09/27/21 0633  GLUCAP 123* 139* 99 165* 132*   Lipid Profile: No results for input(s): CHOL, HDL, LDLCALC, TRIG, CHOLHDL, LDLDIRECT in the last 72 hours. Thyroid Function Tests: No results for input(s): TSH, T4TOTAL, FREET4, T3FREE, THYROIDAB in the last 72 hours. Anemia Panel: Recent Labs    09/25/21 0320 09/25/21 0730 09/25/21 0800  VITAMINB12  --   --  1,495*  FOLATE  --  55.8  --   FERRITIN  --   --  12  TIBC  --   --  326  IRON  --   --  23*  RETICCTPCT 3.3*  --   --    Urine analysis:    Component Value Date/Time   COLORURINE YELLOW 11/18/2020 [place] 11/18/2020 1235   LABSPEC 1.012 11/18/2020 1235   PHURINE 6.0 11/18/2020 1235   GLUCOSEU NEGATIVE 11/18/2020 1235   HGBUR NEGATIVE 11/18/2020 1235   BILIRUBINUR NEGATIVE 11/18/2020 1235   KETONESUR NEGATIVE 11/18/2020 1235   PROTEINUR NEGATIVE 11/18/2020 1235   UROBILINOGEN 1.0 04/04/2012 1310   NITRITE NEGATIVE 11/18/2020 1235   LEUKOCYTESUR NEGATIVE 11/18/2020 1235   Sepsis Labs: _0 (procalcitonin:4,lacticidven:4)  ) Recent Results (from the past 240 hour(s))  Resp Panel by RT-PCR (Flu A&B, Covid) Nasopharyngeal Swab     Status: None   Collection Time: 09/24/21  2:07 PM   Specimen: Nasopharyngeal Swab; Nasopharyngeal(NP) swabs in vial transport medium  Result Value Ref Range Status   SARS Coronavirus 2 by RT PCR NEGATIVE NEGATIVE Final    Comment: (NOTE) SARS-CoV-2 target nucleic acids are NOT DETECTED.  The SARS-CoV-2 RNA is generally detectable in upper respiratory specimens during the acute phase of infection. The lowest concentration of SARS-CoV-2 viral copies this  assay can detect is 138 copies/mL. A negative result does not preclude SARS-Cov-2 infection and should not be used as the sole basis for treatment or other patient management decisions. A negative result may occur with  improper specimen collection/handling, submission of specimen other than nasopharyngeal swab, presence of viral mutation(s) within the areas targeted by this assay, and inadequate number of viral copies(<138 copies/mL). A negative result must be combined with clinical observations, patient history, and epidemiological information. The expected result is Negative.  Fact Sheet for Patients:  EntrepreneurPulse.com.au  Fact Sheet for Healthcare Providers:  IncredibleEmployment.be  This test is no t yet approved or cleared by the [place] FDA and  has been authorized for detection and/or diagnosis of SARS-CoV-2 by FDA under an Emergency Use Authorization (EUA). This EUA will remain  in effect (meaning this test can be used) for the duration of the COVID-19 declaration under Section 564(b)(1) of the Act, 21 U.S.C.section 360bbb-3(b)(1), unless the authorization is terminated  or revoked sooner.       Influenza A by PCR NEGATIVE NEGATIVE Final   Influenza B by PCR NEGATIVE NEGATIVE Final    Comment: (NOTE) The Xpert Xpress SARS-CoV-2/FLU/RSV plus assay is intended as an aid in the diagnosis of influenza from Nasopharyngeal swab specimens and should not be used as a sole basis for treatment. Nasal washings and aspirates are unacceptable for Xpert Xpress SARS-CoV-2/FLU/RSV testing.  Fact Sheet for Patients: EntrepreneurPulse.com.au  Fact Sheet for Healthcare Providers: IncredibleEmployment.be  This test is not yet approved or cleared by the [place] FDA and has been authorized for detection and/or diagnosis of SARS-CoV-2 by FDA under an Emergency Use Authorization (EUA). This EUA will  remain in effect (meaning this test can be used) for the duration of the COVID-19 declaration under Section 564(b)(1) of the Act, 21 U.S.C. section 360bbb-3(b)(1), unless the authorization is terminated or revoked.  Performed at [facility], [place] [address], [place], [place] [address]   Respiratory (~20 pathogens) panel by PCR     Status: None   Collection Time: 09/24/21  2:07 PM   Specimen: Nasopharyngeal Swab; Respiratory  Result Value Ref Range Status   Adenovirus NOT DETECTED NOT DETECTED Final   Coronavirus 229E NOT DETECTED NOT DETECTED Final    Comment: (NOTE) The Coronavirus on the Respiratory Panel, DOES NOT test for the novel  Coronavirus (2019 nCoV)    Coronavirus HKU1 NOT DETECTED NOT DETECTED Final   Coronavirus NL63 NOT DETECTED NOT DETECTED Final   Coronavirus OC43 NOT DETECTED NOT DETECTED Final   Metapneumovirus NOT DETECTED NOT DETECTED Final   Rhinovirus / Enterovirus NOT DETECTED NOT DETECTED Final   Influenza A NOT DETECTED NOT DETECTED Final   Influenza B NOT DETECTED NOT DETECTED Final   Parainfluenza Virus 1 NOT DETECTED NOT DETECTED Final   Parainfluenza Virus 2 NOT DETECTED NOT DETECTED Final   Parainfluenza Virus 3 NOT DETECTED NOT DETECTED Final   Parainfluenza Virus 4 NOT DETECTED NOT DETECTED Final   Respiratory Syncytial Virus NOT DETECTED NOT DETECTED Final   Bordetella pertussis NOT DETECTED NOT DETECTED Final   Bordetella Parapertussis NOT DETECTED NOT DETECTED Final   Chlamydophila pneumoniae NOT DETECTED NOT DETECTED Final   Mycoplasma pneumoniae NOT DETECTED NOT DETECTED Final    Comment: Performed at [facility], [place] [address], [place], [place] [address]  Culture, blood (routine x 2)     Status: None (Preliminary result)   Collection Time: 09/24/21  5:19 PM   Specimen: BLOOD  Result Value Ref Range Status   Specimen Description BLOOD BLOOD LEFT ARM  Final   Special Requests   Final    BOTTLES DRAWN AEROBIC ONLY Blood  Culture adequate volume   Culture   Final    NO GROWTH 3 DAYS Performed at [facility], [address], [place], [place] [address]    Report Status PENDING  Incomplete  Culture, blood (routine x 2)     Status: Abnormal   Collection Time: 09/24/21  5:31 PM   Specimen: BLOOD  Result Value Ref Range Status   Specimen Description BLOOD LEFT ANTECUBITAL  Final   Special Requests   Final    BOTTLES DRAWN AEROBIC ONLY Blood Culture adequate volume   Culture  Setup Time   Final    GRAM POSITIVE COCCI IN CLUSTERS AEROBIC BOTTLE ONLY CRITICAL RESULT CALLED TO, READ BACK BY AND VERIFIED WITH: PHARM D E.MARTIN ON [id number] AT 0539 BY E.PARRISH    Culture (A)  Final    STAPHYLOCOCCUS HOMINIS THE SIGNIFICANCE OF ISOLATING THIS ORGANISM FROM A SINGLE SET OF BLOOD CULTURES WHEN MULTIPLE SETS ARE DRAWN IS UNCERTAIN. PLEASE NOTIFY THE MICROBIOLOGY DEPARTMENT WITHIN ONE WEEK IF SPECIATION AND SENSITIVITIES ARE REQUIRED. Performed at [facility], [place] [address], [place], [place] [address]    Report Status 09/27/2021 FINAL  Final  Blood Culture ID Panel (Reflexed)     Status: Abnormal   Collection Time: 09/24/21  5:31 PM  Result Value Ref Range Status   Enterococcus faecalis NOT DETECTED NOT DETECTED Final   Enterococcus Faecium NOT DETECTED NOT DETECTED Final   Listeria monocytogenes NOT DETECTED NOT DETECTED Final   Staphylococcus species DETECTED (A) NOT DETECTED Final    Comment: CRITICAL RESULT CALLED TO, READ BACK BY AND VERIFIED WITH: PHARM D E.MARTIN ON [id number] AT 1405 BY E.PARRISH    Staphylococcus aureus (BCID) NOT DETECTED NOT DETECTED Final   Staphylococcus epidermidis NOT DETECTED NOT DETECTED Final   Staphylococcus lugdunensis NOT DETECTED NOT DETECTED Final   Streptococcus species NOT DETECTED NOT DETECTED Final   Streptococcus agalactiae NOT DETECTED NOT DETECTED Final   Streptococcus pneumoniae NOT DETECTED NOT DETECTED Final   Streptococcus pyogenes NOT DETECTED NOT  DETECTED Final   A.calcoaceticus-baumannii NOT DETECTED NOT DETECTED Final   Bacteroides fragilis NOT DETECTED NOT DETECTED Final   Enterobacterales  NOT DETECTED NOT DETECTED Final   Enterobacter cloacae complex NOT DETECTED NOT DETECTED Final   Escherichia coli NOT DETECTED NOT DETECTED Final   Klebsiella aerogenes NOT DETECTED NOT DETECTED Final   Klebsiella oxytoca NOT DETECTED NOT DETECTED Final   Klebsiella pneumoniae NOT DETECTED NOT DETECTED Final   Proteus species NOT DETECTED NOT DETECTED Final   Salmonella species NOT DETECTED NOT DETECTED Final   Serratia marcescens NOT DETECTED NOT DETECTED Final   Haemophilus influenzae NOT DETECTED NOT DETECTED Final   Neisseria meningitidis NOT DETECTED NOT DETECTED Final   Pseudomonas aeruginosa NOT DETECTED NOT DETECTED Final   Stenotrophomonas maltophilia NOT DETECTED NOT DETECTED Final   Candida albicans NOT DETECTED NOT DETECTED Final   Candida auris NOT DETECTED NOT DETECTED Final   Candida glabrata NOT DETECTED NOT DETECTED Final   Candida krusei NOT DETECTED NOT DETECTED Final   Candida parapsilosis NOT DETECTED NOT DETECTED Final   Candida tropicalis NOT DETECTED NOT DETECTED Final   Cryptococcus neoformans/gattii NOT DETECTED NOT DETECTED Final    Comment: Performed at [facility], [place] [address], [place], [place] [address]     Radiology Studies: CT CHEST WO CONTRAST  Result Date: 09/25/2021 CLINICAL DATA:  Pneumonia EXAM: CT CHEST WITHOUT CONTRAST TECHNIQUE: Multidetector CT imaging of the chest was performed following the standard protocol without IV contrast. RADIATION DOSE REDUCTION: This exam was performed according to the departmental dose-optimization program which includes automated exposure control, adjustment of the mA and/or kV according to patient size and/or use of iterative reconstruction technique. COMPARISON:  09/14/2021 FINDINGS: Cardiovascular: Heart is enlarged in size. Coronary artery calcifications are  seen. There is ectasia of main pulmonary artery measuring 3.9 cm suggesting pulmonary arterial hypertension. Mediastinum/Nodes: There are slightly enlarged lymph nodes in mediastinum with no significant change. Left lobe of thyroid is enlarged with multiple nodules. Lungs/Pleura: Centrilobular emphysema is seen. There is interval increase in patchy infiltrates in both lower lung fields. There are small linear densities in right middle lobe and lingula with interval worsening. There is linear density in the lumen of trachea, possibly mucous. There is interval appearance of small bilateral pleural effusions, more so on the right side. There is no pneumothorax. Upper Abdomen: There are multiple low-density lesions of varying sizes in the liver with no significant interval change. Small ascites is present Musculoskeletal: Compression fracture of upper endplate of body of L1 vertebra has not changed. IMPRESSION: COPD. There is interval increase in infiltrates in both lungs, especially in the lower lung fields suggesting atelectasis/pneumonia. There is interval appearance of small bilateral pleural effusions, more so on the right side. Cardiomegaly. Coronary artery calcifications are seen. Pulmonary arterial hypertension. There are multiple low-density lesions in the liver with no significant change suggesting possible cysts or hemangiomas. Small ascites. Left lobe of thyroid is enlarged with multiple nodules. Follow-up thyroid sonogram may be considered. Electronically Signed   By: Elmer Picker M.D.   On: 09/25/2021 13:55     Scheduled Meds:  sodium chloride   Intravenous Once   aspirin EC  81 mg Oral Daily   atorvastatin  80 mg Oral Daily   azithromycin  500 mg Oral QPM   carvedilol  3.125 mg Oral BID WC   cyclobenzaprine  10 mg Oral q AM   enoxaparin (LOVENOX) injection  30 mg Subcutaneous Q24H   furosemide  40 mg Oral BID   insulin aspart  0-9 Units Subcutaneous TID WC   macitentan  10 mg Oral  Daily  mirtazapine  30 mg Oral QHS   polyvinyl alcohol  1 drop Both Eyes BID   predniSONE  40 mg Oral Q breakfast   Followed by   Derrill Memo ON 09/30/2021] predniSONE  30 mg Oral Q breakfast   Followed by   Derrill Memo ON 10/04/2021] predniSONE  20 mg Oral Q breakfast   Followed by   Derrill Memo ON 10/08/2021] predniSONE  10 mg Oral Q breakfast   Riociguat  2.5 mg Oral TID   sodium chloride flush  3 mL Intravenous Q12H   Treprostinil Diolamine ER  0.125 mg Oral TID   And   Treprostinil Diolamine ER  0.75 mg Oral TID   Continuous Infusions:  sodium chloride     cefTRIAXone (ROCEPHIN)  IV 1 g (09/26/21 1628)   ferric gluconate (FERRLECIT) IVPB 250 mg (09/26/21 1302)     LOS: 3 days    Time spent: 62mn    PDomenic Polite MD [facility]   09/27/2021, 10:25 AM

## 2021-09-27 NOTE — Progress Notes (Addendum)
Patient ID: [name], female   DOB: 01/20/1932, 88 y.o.   MRN: [id number]     Advanced Heart Failure Rounding Note  PCP-Cardiologist: Jenkins Rouge, MD   Subjective:    Remains on azithromycin + ceftriaxone. AF. WBCs not elevated.  CT chest 2/21 showed emphysema with new patchy infiltrates both lower lobes concerning for PNA.  GPCs growing in blood cultures => coag negative Staph.  PCT <0.10   Respiratory panel negative.  Hgb 9.7 today, transfused 1 unit PRBCs yesterday. AKI resolved.   Remains SOB with exertion.   Echo this admission: EF 55-60%, mild LVH, RV severely enlarged with mildly decreased systolic function, D-shaped setpum, PASP 64, IVC dilated.   Objective:   Weight Range: 53.4 kg Body mass index is 21.53 kg/m.   Vital Signs:   Temp:  [97.7 F (36.5 C)-98.4 F (36.9 C)] 98 F (36.7 C) (02/23 0722) Pulse Rate:  [93-112] 105 (02/23 0722) Resp:  [15-20] 16 (02/23 0722) BP: (127-145)/(47-64) 141/58 (02/23 0722) SpO2:  [93 %-96 %] 96 % (02/23 0722) Weight:  [53.4 kg] 53.4 kg (02/23 0330) Last BM Date : 09/26/21  Weight change: Filed Weights   09/25/21 0500 09/26/21 0356 09/27/21 0330  Weight: 54.7 kg 54.6 kg 53.4 kg    Intake/Output:   Intake/Output Summary (Last 24 hours) at 09/27/2021 0921 Last data filed at 09/27/2021 0802 Gross per 24 hour  Intake 550 ml  Output 1250 ml  Net -700 ml      Physical Exam   General: NAD Neck: JVP 8-9 cm, no thyromegaly or thyroid nodule.  Lungs: Dry crackles at bases.  CV: Nondisplaced PMI.  Heart regular S1/S2, no S3/S4, no murmur.  No peripheral edema.   Abdomen: Soft, nontender, no hepatosplenomegaly, no distention.  Skin: Intact without lesions or rashes.  Neurologic: Alert and oriented x 3.  Psych: Normal affect. Extremities: No clubbing or cyanosis.  HEENT: Normal.   Telemetry   SR 70s   Labs    CBC Recent Labs    09/24/21 1344 09/24/21 2234 09/25/21 0320 09/25/21 2347 09/27/21 0417  WBC 7.6    < > 5.8  --  4.3  NEUTROABS 4.9  --   --   --   --   HGB 7.6*   < > 7.2* 9.5* 9.7*  HCT 27.5*   < > 25.4* 31.8* 32.3*  MCV 89.3   < > 88.2  --  84.8  PLT 378   < > 305  --  318   < > = values in this interval not displayed.   Basic Metabolic Panel Recent Labs    09/24/21 1700 09/25/21 0320 09/26/21 0344 09/27/21 0417  NA  --    < > 141 143  K  --    < > 3.6 3.9  CL  --    < > 102 103  CO2  --    < > 29 28  GLUCOSE  --    < > 109* 149*  BUN  --    < > 41* 28*  CREATININE  --    < > 1.12* 0.98  CALCIUM  --    < > 8.6* 9.1  MG 2.1  --   --   --    < > = values in this interval not displayed.   Liver Function Tests Recent Labs    09/24/21 1344 09/24/21 1743  AST 21 22  ALT 30 31  ALKPHOS 46 47  BILITOT 0.2* 0.2*  PROT 5.8* 6.2*  ALBUMIN 2.6* 2.6*   No results for input(s): LIPASE, AMYLASE in the last 72 hours. Cardiac Enzymes No results for input(s): CKTOTAL, CKMB, CKMBINDEX, TROPONINI in the last 72 hours.  BNP: BNP (last 3 results) Recent Labs    12/29/20 1438 08/24/21 1049 09/24/21 1344  BNP 358.7* 280.8* 695.1*    ProBNP (last 3 results) No results for input(s): PROBNP in the last 8760 hours.   D-Dimer No results for input(s): DDIMER in the last 72 hours. Hemoglobin A1C No results for input(s): HGBA1C in the last 72 hours. Fasting Lipid Panel No results for input(s): CHOL, HDL, LDLCALC, TRIG, CHOLHDL, LDLDIRECT in the last 72 hours. Thyroid Function Tests No results for input(s): TSH, T4TOTAL, T3FREE, THYROIDAB in the last 72 hours.  Invalid input(s): FREET3  Other results:   Imaging    No results found.   Medications:     Scheduled Medications:  sodium chloride   Intravenous Once   aspirin EC  81 mg Oral Daily   atorvastatin  80 mg Oral Daily   azithromycin  500 mg Oral QPM   carvedilol  3.125 mg Oral BID WC   cyclobenzaprine  10 mg Oral q AM   enoxaparin (LOVENOX) injection  30 mg Subcutaneous Q24H   insulin aspart  0-9 Units  Subcutaneous TID WC   macitentan  10 mg Oral Daily   mirtazapine  30 mg Oral QHS   polyvinyl alcohol  1 drop Both Eyes BID   predniSONE  40 mg Oral Q breakfast   Followed by   Derrill Memo ON 09/30/2021] predniSONE  30 mg Oral Q breakfast   Followed by   Derrill Memo ON 10/04/2021] predniSONE  20 mg Oral Q breakfast   Followed by   Derrill Memo ON 10/08/2021] predniSONE  10 mg Oral Q breakfast   Riociguat  2.5 mg Oral TID   sodium chloride flush  3 mL Intravenous Q12H   Treprostinil Diolamine ER  0.125 mg Oral TID   And   Treprostinil Diolamine ER  0.75 mg Oral TID    Infusions:  sodium chloride     cefTRIAXone (ROCEPHIN)  IV 1 g (09/26/21 1628)   ferric gluconate (FERRLECIT) IVPB 250 mg (09/26/21 1302)    PRN Medications: sodium chloride, acetaminophen **OR** acetaminophen, ALPRAZolam, melatonin, sodium chloride, sodium chloride flush, traMADol-acetaminophen    Patient Profile   88 y/o woman with possible scleroderma/CREST with ILD and PAH, chronic diastolic HF,hiatal hernia and CAD, admitted for a/c hypoxic respiratory failure in the setting of large RLL PNA. Also w/ AKI, felt to be dehydrated.    Assessment/Plan   1. Chronic diastolic CHF:  Echo this admission with EF 55-60%, mild LVH, RV severely enlarged with mildly decreased systolic function, D-shaped septum, PASP 64, IVC dilated.  Suspect mild volume overload on exam. Creatinine normalized  - Volume status improved. Stop IV lasix. Switch to lasix 40 mg twice a day. (Home dose) 2. Pulmonary hypertension: She appeared to have at least moderate pulmonary hypertension by echo in 7/19 with RV failure. CT chest in 2019 showed mild emphysema, which should not explain her degree of RV failure.  V/Q scan did not show evidence for chronic PEs. Anti-centromere antibody, ANA, and RF were all positive.  ?CREST variant of scleroderma.  She is on home oxygen 4L [place].  [place] 05/15/18 showed moderate PAH but very high PVR and low cardiac output.  This was  concerning for advanced pulmonary hypertension.  Repeat echo in 3/21 showed moderately dilated/dysfunctional  RV but PASP estimate was lower.  Echo this admission with  EF 55-60%, mild LVH, RV severely enlarged with mildly decreased systolic function, D-shaped setpum, PASP 64, IVC dilated.  Recent chest CT 02/23 suggestive of progressive ILD. ? Scleroderma related ILD. Now follows with Pulmonary.  - She did not tolerate Uptravi.   - Continue riociguat, now on goal dose.  - Continue Opsumit 10 mg daily.  - Continue orenitram, she is unable to titrate any higher due to GI side effects.   - Refused sleep study. 3. Acute on chronic respiratory failure with hypoxia:  On 4L O2 at home, currently 15L HFNC.  Though PCT < 0.1 and WBCs not elevated, repeat CT chest shows new patchy infiltrates in bilateral lower lobes concerning for PNA.  Swallow study without significant aspiration.  - Agree with PNA treatment with ceftriaxone/azithro. - Coag negative Staph is likely contaminant.  - Incentive spirometry.  - Wean oxygen, keep sats > 88%.  4. AKI: Resolved.   5. CAD: Coronary angiography showed complex bifurcation lesion with 95% stenosis proximal LAD with calcification at take-off of large D2. The ostial/proximal D2 also had 95% calcified stenosis. Suspect this lesion played a role in her exertional dyspnea and also in her mildly decreased LV systolic function (EF 98-65% on echo in 7/19).  S/p successful atherectomy with bifurcation stenting of proximal LAD and D2 05/15/18.  Repeat LHC in 3/20 showed nonobstructive disease.  No chest pain.   - Continue ASA 81.  - Plavix stopped with anemia.   - Continue atorvastatin 6. ?CREST syndrome: Patient saw [facility]. Per her report, they did not think that she had CREST syndrome.    7. Anemia: Fe deficiency. No overt GI bleeding, per her report FOBT was negative.  She is being followed by hematology and is on erythropoeitin.  It is possible that the  anemia comes from Opsumit or Adempas.  However, she improved initially while still on these medications.  Would like to continue unless hematologist feels strongly about trying her off of one of them.  2/21 given 1 unit PRBCs . Also has had Feraheme.  - Transfuse Hgb < 7. - stable today.   Mobilize, PT/OT.   Length of Stay: 3  Amy Clegg, NP  09/27/2021, 9:21 AM  Advanced Heart Failure Team Pager [phone] (M-F; 7a - 5p)  Please contact [facility] for night-coverage after hours (5p -7a ) and weekends on [email or web address]  Patient seen with NP, agree with the above note.    She seems to be making some improvement.  Oxygen down to 4L HFNC.  She remains on ceftriaxone/azithromycin.    General: NAD Neck: No JVD, no thyromegaly or thyroid nodule.  Lungs: Dry crackles at bases. CV: Nondisplaced PMI.  Heart regular S1/S2, no S3/S4, no murmur.  No peripheral edema.   Abdomen: Soft, nontender, no hepatosplenomegaly, no distention.  Skin: Intact without lesions or rashes.  Neurologic: Alert and oriented x 3.  Psych: Normal affect. Extremities: No clubbing or cyanosis.  HEENT: Normal.   Can transition back to po Lasix.   Started on steroids by CCM for ?component of CTD-related ILD flare.  Continue treatment for CAP.  She is improving slowly, oxygen need coming down.   Loralie Champagne 09/27/2021 10:02 AM

## 2021-09-27 NOTE — Evaluation (Signed)
Physical Therapy Evaluation Patient Details Name: [name] MRN: [id number] DOB: 19-Jan-1932 Today's Date: 09/27/2021  History of Present Illness  The pt is an 88 yo female presenting 2/20 with weakness and hypoxia. Upon work up, imaging suggestive of PNA and bilateral pleural effusions. PMH includes: chronic resp failure on 2L O2, ILD, HTN, CVA, DM II, CAD, arthritis, severe PAH, and CHF.   Clinical Impression  Pt in bed upon arrival of PT, agreeable to evaluation at this time. Prior to admission the pt was mobilizing without use of DME in the home, but reports use of cane for longer distance ambulation or ambulation at night. The pt now presents with limitations in functional mobility, endurance, and dynamic stability, but is likely close to her functional baseline as she reports she is mostly sedentary at home but is able to walk short distances in the home as needed. The pt was able to complete 2 short bouts of ambulation, one to the bathroom in the room (~20 ft total) and second of ~45 ft in hallway. She was able to tolerate with SpO2 > 92% on 4L O2 with all activity this morning, but was limited in further ambulation progression due to fatigue. The pt will be safe to return home with family support when medically stable for d/c, pt reports she has two daughters who live next to her and are able to assist as needed.   SpO2 on 4L at rest: 98-100% SpO2 on 4L with ambulation: 94-96%     Recommendations for follow up therapy are one component of a multi-disciplinary discharge planning process, led by the attending physician.  Recommendations may be updated based on patient status, additional functional criteria and insurance authorization.  Follow Up Recommendations No PT follow up    Assistance Recommended at Discharge Intermittent Supervision/Assistance  Patient can return home with the following  [name] with walking and/or transfers;Assistance with cooking/housework;Help with  stairs or ramp for entrance;Assist for transportation    Equipment Recommendations None recommended by PT  Recommendations for Other Services       Functional Status Assessment Patient has had a recent decline in their functional status and demonstrates the ability to make significant improvements in function in a reasonable and predictable amount of time.     Precautions / Restrictions Precautions Precautions: Fall Precaution Comments: Watch O2 Restrictions Weight Bearing Restrictions: No      Mobility  Bed Mobility Overal bed mobility: Modified Independent             General bed mobility comments: increased time and effot, no assist    Transfers Overall transfer level: Needs assistance Equipment used: 1 person hand held assist Transfers: Sit to/from Stand Sit to Stand: Min guard           General transfer comment: minG for safety, pt able to stand and steady without assist.    Ambulation/Gait Ambulation/Gait assistance: Min guard Gait Distance (Feet): 15 Feet (+ 45 ft) Assistive device: 1 person hand held assist Gait Pattern/deviations: Step-through pattern, Decreased stride length Gait velocity: decreased Gait velocity interpretation: <1.31 ft/sec, indicative of household ambulator   General Gait Details: small steps with narrow BOS. reliant on single UE support. no overt LOB     Balance Overall balance assessment: Needs assistance Sitting-balance support: Feet supported Sitting balance-Leahy Scale: Good     Standing balance support: Single extremity supported Standing balance-Leahy Scale: Fair Standing balance comment: dependent on single UE support to maintain with gait.  Pertinent Vitals/Pain Pain Assessment Pain Assessment: No/denies pain    Home Living Family/patient expects to be discharged to:: Private residence Living Arrangements: Alone Available Help at Discharge: Family;Available 24  hours/day Type of Home: House Home Access: Level entry       Home Layout: One level Home Equipment: Advice worker (2 wheels);Cane - single point Additional Comments: pt on 4L O2 normally    Prior Function Prior Level of Function : Independent/Modified Independent             Mobility Comments: Generally moves with a SPC ADLs Comments: No assist with ADL/IADL.  Daughter assists with community mobility and occasional meals.     Hand Dominance   Dominant Hand: Right    Extremity/Trunk Assessment   Upper Extremity Assessment Upper Extremity Assessment: Defer to OT evaluation    Lower Extremity Assessment Lower Extremity Assessment: Overall WFL for tasks assessed (generally functional, low muscle bulk, poor endurance)    Cervical / Trunk Assessment Cervical / Trunk Assessment: Kyphotic  Communication   Communication: No difficulties;HOH  Cognition Arousal/Alertness: Awake/alert Behavior During Therapy: WFL for tasks assessed/performed Overall Cognitive Status: Within Functional Limits for tasks assessed                                 General Comments: pt able to follow all cues and instructions        General Comments General comments (skin integrity, edema, etc.): pt on 8L upon my arrival, able to sustain 98% on 4L at rest, 94-96% with 4L for gait.        Assessment/Plan    PT Assessment Patient needs continued PT services  PT Problem List Decreased strength;Decreased activity tolerance;Decreased balance;Decreased mobility;Decreased coordination;Cardiopulmonary status limiting activity       PT Treatment Interventions Gait training;Stair training;Functional mobility training;Therapeutic activities;Therapeutic exercise;Balance training;Patient/family education    PT Goals (Current goals can be found in the Care Plan section)  Acute Rehab PT Goals Patient Stated Goal: return home with family support PT Goal Formulation: With  patient Time For Goal Achievement: 10/11/21 Potential to Achieve Goals: Good    Frequency Min 3X/week        AM-PAC PT "6 Clicks" Mobility  Outcome Measure Help needed turning from your back to your side while in a flat bed without using bedrails?: None Help needed moving from lying on your back to sitting on the side of a flat bed without using bedrails?: None Help needed moving to and from a bed to a chair (including a wheelchair)?: [name] needed standing up from a chair using your arms (e.g., wheelchair or bedside chair)?: [name] needed to walk in hospital room?: [name] needed climbing 3-5 steps with a railing? : A Little 6 Click Score: 20    End of Session Equipment Utilized During Treatment: Gait belt;Oxygen Activity Tolerance: Patient tolerated treatment well Patient left: in chair;with call bell/phone within reach;with chair alarm set Nurse Communication: Mobility status PT Visit Diagnosis: Unsteadiness on feet (R26.81);Other abnormalities of gait and mobility (R26.89)    Time: 0569-7948 PT Time Calculation (min) (ACUTE ONLY): 34 min   Charges:   PT Evaluation $PT Eval Low Complexity: 1 Low PT Treatments $Therapeutic Exercise: 8-22 mins        West Carbo, PT, DPT   Acute Rehabilitation Department Pager #: [phone]  Sandra Cockayne 09/27/2021, 9:48 AM

## 2021-09-27 NOTE — Progress Notes (Signed)
NAME:  [name], MRN:  [id number], DOB:  07/01/1932, LOS: 3 ADMISSION DATE:  09/24/2021, CONSULTATION DATE:  2/22 REFERRING MD:  Dr. Broadus John, CHIEF COMPLAINT:  worsening dyspnea, cough  History of Present Illness:  88 y/o female with mutiple medical problems admitted with pneumonia in the context of severe PAH, chronic respiratory failure with hypoxemia, and likely underlying ILD.    Pertinent  Medical History  Diffuse parenchymal lung disease of undetermined etiology Anemia Chronic respiratory failure with hypoxemia DM2 Esophageal stricture GERD Hyperlipidemia Macular degeneration History of CVA  Significant Hospital Events: Including procedures, antibiotic start and stop dates in addition to other pertinent events   2/20: admitted to [facility] w/ hypoxic resp failure  Interim History / Subjective:  Feels OK Oxygen down to 7 L  Denies dyspnea  Objective   Blood pressure (!) 141/58, pulse (!) 105, temperature 98 F (36.7 C), temperature source Oral, resp. rate 16, height _0  (1.575 m), weight 53.4 kg, SpO2 96 %.        Intake/Output Summary (Last 24 hours) at 09/27/2021 0951 Last data filed at 09/27/2021 0802 Gross per 24 hour  Intake 550 ml  Output 1250 ml  Net -700 ml   Filed Weights   09/25/21 0500 09/26/21 0356 09/27/21 0330  Weight: 54.7 kg 54.6 kg 53.4 kg    Examination: General:  frail elderly female resting comfortably in bed HENT: NCAT OP clear PULM: Crackles upper lobes B, normal effort CV: RRR, no mgr GI: BS+, soft, nontender MSK: normal bulk and tone Neuro: awake, alert, no distress, MAEW   Resolved Hospital Problem list     Assessment & Plan:  Pulmonary arterial hypertension  Aspiration pneumonia in bilateral lower lobes of lungs, organism unspecified Centrilobular emphysema Diffuse parenchymal lung disease Acute on chronic respiratory failure with hypoxemia Moderate muscular deconditioning  Discussion There is no clear evidence that  her ILD has progressed.  However she has significant pneumonia probably related to her underlying esophageal stricture/aspiration.  Given that she is 88 years old and has multiple severe underlying chronic illnesses her prognosis from this illness is guarded.  I explained to her daughter that this will take weeks to months to recover and there is a high likelihood of other complications during her recovery.    Plan Agree with palliative care consultation Agree with prednisone taper as outlined by Dr. Loanne Drilling Wean off O2 for O2 saturation > 88% Out of bed, encourage mobility (I discussed with PT today) Abx per primary service Consider outpatient GI consultation if her condition improves No change in pulmonary hypertension regimen (triple therapy with riociguat, treprostinil and macitentan) recommended, defer to heart failure  Best Practice (right click and "Reselect all SmartList Selections" daily)   Code status DNR  I updated her daughter Caryl Asp by phone  Labs   CBC: Recent Labs  Lab 09/24/21 1344 09/24/21 2234 09/25/21 0320 09/25/21 2347 09/27/21 0417  WBC 7.6 6.6 5.8  --  4.3  NEUTROABS 4.9  --   --   --   --   HGB 7.6* 7.3* 7.2* 9.5* 9.7*  HCT 27.5* 24.9* 25.4* 31.8* 32.3*  MCV 89.3 87.4 88.2  --  84.8  PLT 378 314 305  --  462    Basic Metabolic Panel: Recent Labs  Lab 09/24/21 1344 09/24/21 1700 09/25/21 0320 09/26/21 0344 09/27/21 0417  NA 134*  --  138 141 143  K 4.7  --  3.9 3.6 3.9  CL 97*  --  103  102 103  CO2 27  --  _0 GLUCOSE 145*  --  101* 109* 149*  BUN 66*  --  61* 41* 28*  CREATININE 1.67*  --  1.44* 1.12* 0.98  CALCIUM 8.4*  --  8.3* 8.6* 9.1  MG  --  2.1  --   --   --    GFR: Estimated Creatinine Clearance: 32 mL/min (by C-G formula based on SCr of 0.98 mg/dL). Recent Labs  Lab 09/24/21 1344 09/24/21 1400 09/24/21 1700 09/24/21 2234 09/25/21 0320 09/26/21 0344 09/27/21 0417  PROCALCITON  --   --  0.10  --  <0.10 <0.10  --   WBC  7.6  --   --  6.6 5.8  --  4.3  LATICACIDVEN  --  0.5  --   --   --   --   --     Liver Function Tests: Recent Labs  Lab 09/24/21 1344 09/24/21 1743  AST 21 22  ALT 30 31  ALKPHOS 46 47  BILITOT 0.2* 0.2*  PROT 5.8* 6.2*  ALBUMIN 2.6* 2.6*   No results for input(s): LIPASE, AMYLASE in the last 168 hours. No results for input(s): AMMONIA in the last 168 hours.  ABG    Component Value Date/Time   PHART 7.377 05/19/2018 1755   PCO2ART 36.3 05/19/2018 1755   PO2ART 50.4 (L) 05/19/2018 1755   HCO3 20.9 05/19/2018 1755   TCO2 27 05/14/2018 1141   TCO2 26 05/14/2018 1141   ACIDBASEDEF 3.4 (H) 05/19/2018 1755   O2SAT 82.5 05/19/2018 1755     Coagulation Profile: No results for input(s): INR, PROTIME in the last 168 hours.  Cardiac Enzymes: No results for input(s): CKTOTAL, CKMB, CKMBINDEX, TROPONINI in the last 168 hours.  HbA1C: Hgb A1c MFr Bld  Date/Time Value Ref Range Status  10/07/2018 02:03 AM 7.3 (H) 4.8 - 5.6 % Final    Comment:    (NOTE) Pre diabetes:          5.7%-6.4% Diabetes:              >6.4% Glycemic control for   <7.0% adults with diabetes   12/09/2016 01:03 PM 7.2 (H) 4.8 - 5.6 % Final    Comment:    (NOTE)         Pre-diabetes: 5.7 - 6.4         Diabetes: >6.4         Glycemic control for adults with diabetes: <7.0     CBG: Recent Labs  Lab 09/26/21 0637 09/26/21 1209 09/26/21 1638 09/26/21 2130 09/27/21 0633  GLUCAP 123* 139* 99 165* 132*       Critical care time: n/a > 50 minutes spent in chart review, reviewing images and updating the patient and her daughter    Roselie Awkward, MD [facility] Pager: [phone] Cell: [phone] After 7:00 pm call Elink  [phone]

## 2021-09-28 DIAGNOSIS — Z515 Encounter for palliative care: Secondary | ICD-10-CM

## 2021-09-28 DIAGNOSIS — K2100 Gastro-esophageal reflux disease with esophagitis, without bleeding: Secondary | ICD-10-CM

## 2021-09-28 DIAGNOSIS — Z7189 Other specified counseling: Secondary | ICD-10-CM

## 2021-09-28 DIAGNOSIS — N1832 Chronic kidney disease, stage 3b: Secondary | ICD-10-CM

## 2021-09-28 DIAGNOSIS — J189 Pneumonia, unspecified organism: Secondary | ICD-10-CM

## 2021-09-28 DIAGNOSIS — I25119 Atherosclerotic heart disease of native coronary artery with unspecified angina pectoris: Secondary | ICD-10-CM

## 2021-09-28 DIAGNOSIS — N179 Acute kidney failure, unspecified: Secondary | ICD-10-CM

## 2021-09-28 DIAGNOSIS — I5033 Acute on chronic diastolic (congestive) heart failure: Secondary | ICD-10-CM

## 2021-09-28 DIAGNOSIS — J849 Interstitial pulmonary disease, unspecified: Secondary | ICD-10-CM

## 2021-09-28 DIAGNOSIS — Z8673 Personal history of transient ischemic attack (TIA), and cerebral infarction without residual deficits: Secondary | ICD-10-CM

## 2021-09-28 DIAGNOSIS — E785 Hyperlipidemia, unspecified: Secondary | ICD-10-CM

## 2021-09-28 LAB — CBC
HCT: 30.9 % — ABNORMAL LOW (ref 36.0–46.0)
Hemoglobin: 9.3 g/dL — ABNORMAL LOW (ref 12.0–15.0)
MCH: 25.4 pg — ABNORMAL LOW (ref 26.0–34.0)
MCHC: 30.1 g/dL (ref 30.0–36.0)
MCV: 84.4 fL (ref 80.0–100.0)
Platelets: 344 10*3/uL (ref 150–400)
RBC: 3.66 MIL/uL — ABNORMAL LOW (ref 3.87–5.11)
RDW: 16.9 % — ABNORMAL HIGH (ref 11.5–15.5)
WBC: 8.9 10*3/uL (ref 4.0–10.5)
nRBC: 0 % (ref 0.0–0.2)

## 2021-09-28 LAB — GLUCOSE, CAPILLARY
Glucose-Capillary: 126 mg/dL — ABNORMAL HIGH (ref 70–99)
Glucose-Capillary: 162 mg/dL — ABNORMAL HIGH (ref 70–99)

## 2021-09-28 LAB — BASIC METABOLIC PANEL
Anion gap: 9 (ref 5–15)
BUN: 26 mg/dL — ABNORMAL HIGH (ref 8–23)
CO2: 27 mmol/L (ref 22–32)
Calcium: 9.2 mg/dL (ref 8.9–10.3)
Chloride: 107 mmol/L (ref 98–111)
Creatinine, Ser: 0.97 mg/dL (ref 0.44–1.00)
GFR, Estimated: 57 mL/min — ABNORMAL LOW (ref >60)
Glucose, Bld: 119 mg/dL — ABNORMAL HIGH (ref 70–99)
Potassium: 3.3 mmol/L — ABNORMAL LOW (ref 3.5–5.1)
Sodium: 143 mmol/L (ref 135–145)

## 2021-09-28 LAB — PHOSPHORUS: Phosphorus: 2.6 mg/dL (ref 2.5–4.6)

## 2021-09-28 LAB — MAGNESIUM: Magnesium: 1.8 mg/dL (ref 1.7–2.4)

## 2021-09-28 MED ORDER — MAGNESIUM SULFATE 2 GM/50ML IV SOLN
2.0000 g | Freq: Once | INTRAVENOUS | Status: AC
  Administered 2021-09-28: 2 g via INTRAVENOUS
  Filled 2021-09-28: qty 50

## 2021-09-28 MED ORDER — POTASSIUM CHLORIDE CRYS ER 20 MEQ PO TBCR
40.0000 meq | EXTENDED_RELEASE_TABLET | Freq: Two times a day (BID) | ORAL | Status: DC
  Administered 2021-09-28: 40 meq via ORAL
  Filled 2021-09-28: qty 2

## 2021-09-28 MED ORDER — PREDNISONE 10 MG PO TABS: ORAL_TABLET | ORAL | 0 refills | Status: DC

## 2021-09-28 MED ORDER — ONDANSETRON HCL 4 MG/2ML IJ SOLN
4.0000 mg | Freq: Four times a day (QID) | INTRAMUSCULAR | Status: DC | PRN
  Administered 2021-09-28: 4 mg via INTRAVENOUS
  Filled 2021-09-28: qty 2

## 2021-09-28 MED ORDER — PANTOPRAZOLE SODIUM 40 MG PO TBEC: 40.0000 mg | DELAYED_RELEASE_TABLET | Freq: Every day | ORAL | 0 refills | Status: AC

## 2021-09-28 MED ORDER — AZITHROMYCIN 500 MG PO TABS
500.0000 mg | ORAL_TABLET | ORAL | 0 refills | Status: AC
Start: 2021-09-28 — End: 2021-09-29

## 2021-09-28 MED ORDER — PANTOPRAZOLE SODIUM 40 MG PO TBEC
40.0000 mg | DELAYED_RELEASE_TABLET | Freq: Every day | ORAL | Status: DC
Start: 2021-09-28 — End: 2021-09-28
  Administered 2021-09-28: 40 mg via ORAL
  Filled 2021-09-28 (×2): qty 1

## 2021-09-28 MED ORDER — SALINE SPRAY 0.65 % NA SOLN: 1.0000 | NASAL | 0 refills | Status: AC | PRN

## 2021-09-28 MED ORDER — CEFDINIR 300 MG PO CAPS: 300.0000 mg | ORAL_CAPSULE | Freq: Two times a day (BID) | ORAL | 0 refills | Status: AC

## 2021-09-28 NOTE — Progress Notes (Signed)
Patient ambulated 52 ft on 5 liters of oxygen maintain 88% using a rolling walker.

## 2021-09-28 NOTE — TOC Initial Note (Addendum)
Transition of Care [facility]) - Initial/Assessment Note    Patient Details  Name: [name] MRN: [id number] Date of Birth: 1932-05-04  Transition of Care [facility]) CM/SW Contact:    Erenest Rasher, RN Phone Number: [phone] 09/28/2021, 2:57 PM  Clinical Narrative:                 HF TOC CM spoke to pt and dtrs at bedside. Offered choice for Palliative services outpt and Home Health. States she had [facility] in the past. States they want to use a Palliative services in [place]. Contacted Cheri, with [facility] with new referral for Palliative Services outpatient. States they will follow up with dtr. Contacted [facility] rep, Corene Cornea with new referral. Contacted attending for [facility] RN orders with [facility].   [facility] unable to accept referral. Contacted [facility] rep, Amy. And she will review referral for [facility].   Expected Discharge Plan: [place] Barriers to Discharge: No Barriers Identified   Patient Goals and CMS Choice Patient states their goals for this hospitalization and ongoing recovery are:: Wants mother to remain well at home CMS Medicare.gov Compare Post Acute Care list provided to:: Patient Choice offered to / list presented to : Patient, Adult Children  Expected Discharge Plan and Services Expected Discharge Plan: [place]   Discharge Planning Services: CM Consult Post Acute Care Choice: [place] arrangements for the past 2 months: Single Family Home Expected Discharge Date: 09/28/21                         HH Arranged: RN [place] Agency: [place] ([place]) Date [place]: 09/28/21 Time [place]: [place] Representative spoke with at [place]: Corene Cornea  Prior Living Arrangements/Services Living arrangements for the past 2 months: [place] Lives with:: Self Patient language and need for interpreter reviewed:: Yes Do you feel safe going back to the  place where you live?: Yes      Need for Family Participation in Patient Care: Yes (Comment) Care giver support system in place?: Yes (comment) Current home services: DME (rolling walker, shower chair, cane, oxygen ([place])) Criminal Activity/Legal Involvement Pertinent to Current Situation/Hospitalization: No - Comment as needed  Activities of Daily Living      Permission Sought/Granted Permission sought to share information with : Case Manager, Family Supports, PCP Permission granted to share information with : Yes, Verbal Permission Granted  Share Information with NAME: [name]  Permission granted to share info w AGENCY: [place] granted to share info w Relationship: daughter  Permission granted to share info w Contact Information: [phone]  Emotional Assessment Appearance:: Appears stated age Attitude/Demeanor/Rapport: Gracious Affect (typically observed): Accepting Orientation: : Oriented to Self, Oriented to Place, Oriented to  Time, Oriented to Situation   Psych Involvement: No (comment)  Admission diagnosis:  Acute on chronic respiratory failure with hypoxia (HCC) [J96.21] Patient Active Problem List   Diagnosis Date Noted   Acute on chronic diastolic CHF (congestive heart failure) ([place]) 09/26/2021   Acute on chronic respiratory failure with hypoxia ([place]) 09/24/2021   Acute renal failure superimposed on stage 3b chronic kidney disease ([place]) 09/24/2021   Chronic diastolic CHF (congestive heart failure) ([place]) 09/24/2021   ILD (interstitial lung disease) ([place]) 09/24/2021   Type 2 diabetes mellitus ([place]) 09/24/2021   Anemia of chronic kidney failure 05/31/2021   Hiatal hernia  Dysphagia 06/23/2020   Chest pain 10/06/2018   Febrile illness    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 ([place]) 05/19/2018   Pulmonary hypertension, unspecified ([place])    CAD/HLD    Exertional dyspnea 05/14/2018   Abnormal CT  scan, gastrointestinal tract 12/31/2016   GI bleed 12/31/2016   HLD (hyperlipidemia) 12/09/2016   Anxiety and depression 12/09/2016   History of CVA (cerebrovascular accident) 12/09/2016   Back pain 12/09/2016   History of Lumbar herniated disc 08/09/2014   pneumonia  04/04/2012   Acute blood loss anemia 04/01/2012   Fall at home 03/31/2012   Anemia in chronic kidney disease 03/31/2012   Femur fracture, left ([place]) 03/31/2012   Hypertension 03/30/2012   PCP:  [name], PA-C Pharmacy:   [facility] [address] [place] [address] Phone: [phone] Fax: [place], [place] - [place] [address] Phone: [phone] Fax: [place], [place] - [address] [place] [address] Phone: [phone] Fax: [phone]     Social Determinants of Health (SDOH) Interventions    Readmission Risk Interventions No flowsheet data found.

## 2021-09-28 NOTE — Discharge Summary (Signed)
Physician Discharge Summary   Patient: [name] MRN: [id number] DOB: April 02, 1932  Admit date:     09/24/2021  Discharge date: 09/28/21  Discharge Physician: Raiford Noble, DO   PCP: [name], PA-C   Recommendations at discharge:   Follow up with PCP within 1-2 weeks and repeat CBC, CMP, mag, Phos within that timeframe Follow up with Pulmonary Follow up with Cardiology Follow up with Palliative Care in the outpatient setting   Discharge Diagnoses: Principal Problem:   Acute on chronic respiratory failure with hypoxia ([place]) Active Problems:   Hypertension   Anemia in chronic kidney disease   pneumonia    HLD (hyperlipidemia)   Anxiety and depression   History of CVA (cerebrovascular accident)   CAD/HLD   Pulmonary hypertension, unspecified ([place])   Acute renal failure superimposed on stage 3b chronic kidney disease ([place])   ILD (interstitial lung disease) ([place])   Type 2 diabetes mellitus ([place])   Acute on chronic diastolic CHF (congestive heart failure) ([place])  Resolved Problems:   GERD (gastroesophageal reflux disease)   CKD (chronic kidney disease), stage III [facility])   Hospital Course: The patient is an 88 year old chronically ill-appearing Caucasian female with past medical history significant for but not limited to chronic respiratory failure on 4 L of supplemental oxygen via home.,  Severe pulm arterial hypertension, history of interstitial lung disease, history of CAD, history of esophageal stricture, history of CVA, diabetes mellitus type 2, chronic anemia as well as other comorbidities who was presented to the ED by her daughter on account of progressive weakness over the last week as well as being more hypoxic than usual.  She noted to have O2 saturations in the 80s on 5 L of supplemental oxygen and recently saw Dr. Chase Caller with ILD chest CT on 09/14/2021 showed possible progressive interstitial lung disease however pulmonary felt that this was secondary to a  community-acquired pneumonia.  In the ED she was more hypoxic and was placed on a nonrebreather and then transition to high flow nasal cannula 12 L.  Labs noted a low hemoglobin of 7.3 and a BUN/creatinine of 66/1.6.  Her BNP was elevated and her chest x-ray did show patchy right lower lobe opacity and she was started on antibiotics and admitted.  She admitted for acute on chronic respiratory failure and cardiology and both pulmonary were consulted.  She was diuresed significantly and palliative care also evaluated the patient with goals of care discussion being had with outpatient follow-up for palliative to be done.  She is steadily improved and was weaned back to close to her baseline and IV Lasix was changed to p.o. Lasix.  Pulmonary recommended a prednisone taper as well as completing her antibiotics which she did in the hospital.  They encouraged mobility and follow-up in outpatient setting with PCP and pulmonology.  Cardiology heart failure team recommended continuing Lasix 40 as well as twice daily dosing and she is deemed stable for discharge.  Assessment and Plan: * Acute on chronic respiratory failure with hypoxia (HCC)- (present on admission) Has chronic hypoxic respiratory failure secondary to ILD and severe PAH on 4-5L O2 at baseline -Concern for progressive ILD based on recent CT 2/10,  -Repeat CT chest 2/21 with evidence of basilar infiltrates, consistent with pneumonia, continue IV ceftriaxone and azithromycin-day 3, history of known esophageal stricture, no symptoms of dysphagia at this time, SLP evaluation was unremarkable, switch to p.o. ABX tomorrow and pulmonary recommends no further antibiotics it is completed her course -Slowly making progress,  O2 weaned down to 4-5L yesterday morning and went back to her baseline already -However I do think she has considerable disease burden, with advanced age, CHF, severe PAH, ILD, now pneumonia -Will request palliative care evaluation for goals  of care and they had a goals of care conversation patient will go home with outpatient follow-up palliative  Acute on chronic diastolic CHF (congestive heart failure) (HCC) Severe PAH -Diuresed with IV Lasix yesterday, continue coreg,  -CHF team following, volume status improved, plan to transition to oral Lasix  -continue treprostinil, riociguat and opsumit -Cardiology recommends continuing Lasix 40 mg twice daily at discharge and following up outpatient  Type 2 diabetes mellitus ([place]) a1c in 2020 was 7.3 -CBG stable, continue sliding scale -Follow-up in outpatient setting as CBGs have been ranging from 126-187  ILD (interstitial lung disease) ([place]) -seen by rheumatology and pulm, progressive based on recent CT 2/10 but pulmonary feels that this is just secondary to pneumonia -? CREST, but was told by rheumatologist didn't think so per patient -Appreciate pulmonary input, started on prednisone and is now being tapered per [facility] -Follow-up with pulm in outpatient setting and she is completed antibiotics  Acute renal failure superimposed on stage 3b chronic kidney disease ([place]) - Given gentle IV fluids on admission, creatinine down to 1.1 and further improved to 0.97 -Monitor with diuresis  Pulmonary hypertension, unspecified ([place])- (present on admission) - Cardiology following as well as pulmonary -Continue home meds  CAD/HLD- (present on admission) S/p successful atherectomy with bifurcation stenting of proximal LAD and D2 05/15/18.  Repeat LHC in 3/20 showed nonobstructive disease. -continue ASA and lipitor -plavix held due to anemia and has now been discontinued  History of CVA (cerebrovascular accident) Continue ASA/statin   Anxiety and depression- (present on admission) Continue remeron and xanax prn   HLD (hyperlipidemia)- (present on admission) - Continue with atorvastatin 80 mg p.o. daily  Anemia in chronic kidney disease- (present on admission) Seen by oncology with  negative w/u and only finding of CKD Receives retacrit q 4 weeks, last injection 09/06/21.  Baseline has been 8-9.  -Denies any overt bleeding, transfused 1 unit of PRBC for hemoglobin of 7.2 the day before yesterday, anemia panel with severe iron deficiency, she denies any overt bleeding, start IV iron, not a great candidate for endoscopic evaluation with chronic respiratory failure -Patient's hemoglobin/hematocrit is now stable 9.3/30.9  Hypertension- (present on admission) Well controlled, continue coreg   CKD (chronic kidney disease), stage IIIb HCC)-resolved as of 09/24/2021, (present on admission) - BUN/creatinine is relatively stable at 26/0.97 today and her AKI is improved -Avoid further nephrotoxic medications, contrast dyes, hypotension renally dose medications  GERD (gastroesophageal reflux disease)-resolved as of 09/24/2021, (present on admission) - Continue home meds  Pain control - [facility] Controlled Substance Reporting System database was reviewed. and patient was instructed, not to drive, operate heavy machinery, perform activities at heights, swimming or participation in water activities or provide baby-sitting services while on Pain, Sleep and Anxiety Medications; until their outpatient Physician has advised to do so again. Also recommended to not to take more than prescribed Pain, Sleep and Anxiety Medications.   Consultants: Cardiology, pulmonology, palliative care Procedures performed: Recent chest CT and echocardiogram this admission Disposition: Home health Diet recommendation:  Discharge Diet Orders (From admission, onward)     Start     Ordered   09/28/21 0000  Diet - low sodium heart healthy        09/28/21 1314  Cardiac diet  DISCHARGE MEDICATION: Allergies as of 09/28/2021       Reactions   Sulfa Antibiotics Swelling   Mouth and tongue swelling        Medication List     STOP taking these medications    FeroSul 325 (65 FE) MG  tablet Generic drug: ferrous sulfate       TAKE these medications    Adempas 2.5 MG Tabs Generic drug: Riociguat Take 2.5 mg by mouth in the morning, at noon, and at bedtime.   ALPRAZolam 0.5 MG tablet Commonly known as: XANAX Take 0.5 mg by mouth daily as needed for anxiety.   aspirin EC 81 MG tablet Take 1 tablet (81 mg total) by mouth daily.   atorvastatin 80 MG tablet Commonly known as: LIPITOR Take 1 tablet (80 mg total) by mouth daily.   carvedilol 3.125 MG tablet Commonly known as: COREG TAKE 1 TABLET BY MOUTH TWICE DAILY WITH A MEAL   cyclobenzaprine 10 MG tablet Commonly known as: FLEXERIL Take 10 mg by mouth in the morning.   cycloSPORINE 0.05 % ophthalmic emulsion Commonly known as: RESTASIS Place 1 drop into both eyes 2 (two) times daily.   furosemide 40 MG tablet Commonly known as: LASIX Take 1 tablet (40 mg total) by mouth 2 (two) times daily.   gabapentin 300 MG capsule Commonly known as: NEURONTIN Take 300 mg by mouth daily as needed (nerve pain).   lactose free nutrition Liqd Take 237 mLs by mouth 2 (two) times daily between meals.   Melatonin 10 MG Tabs Take 10 mg by mouth at bedtime as needed (sleep).   mirtazapine 30 MG tablet Commonly known as: REMERON Take 30 mg by mouth at bedtime.   Opsumit 10 MG tablet Generic drug: macitentan Take 1 tablet (10 mg total) by mouth daily.   pantoprazole 40 MG tablet Commonly known as: PROTONIX Take 1 tablet (40 mg total) by mouth daily.   predniSONE 10 MG tablet Commonly known as: DELTASONE Take 4 tablets (40 mg total) by mouth daily with breakfast for 1 day, THEN 3 tablets (30 mg total) daily with breakfast for 4 days, THEN 2 tablets (20 mg total) daily with breakfast for 4 days, THEN 1 tablet (10 mg total) daily with breakfast for 4 days. Start taking on: September 29, 2021   RETACRIT IJ Inject 1 Dose as directed every 21 ( twenty-one) days.   sodium chloride 0.65 % Soln nasal  spray Commonly known as: OCEAN Place 1 spray into both nostrils as needed for congestion.   traMADol-acetaminophen 37.5-325 MG tablet Commonly known as: ULTRACET Take 2 tablets by mouth 4 (four) times daily as needed for pain.   Treprostinil Diolamine ER 0.125 MG Tbcr Take 1 tablet by mouth in the morning, at noon, and at bedtime.   Treprostinil Diolamine ER 0.25 MG Tbcr Take 0.75 mg by mouth in the morning, at noon, and at bedtime. Takes three 0.13m and one 0.128mtablet (0.87534motal) three times daily       ASK your doctor about these medications    azithromycin 500 MG tablet Commonly known as: ZITHROMAX Take 1 tablet (500 mg total) by mouth once a week for 1 dose. Ask about: Should I take this medication?   cefdinir 300 MG capsule Commonly known as: OMNICEF Take 1 capsule (300 mg total) by mouth 2 (two) times daily for 1 day. Ask about: Should I take this medication?        Follow-up Information  [name], PA-C. Go on 10/08/2021.   Specialty: Physician Assistant Why: _0 :00pm Contact information: [place] [address] [phone]         Josue Hector, MD .   Specialty: Cardiology Contact information: [address] [address] [phone]         [facility] Follow up on 10/12/2021.   Specialty: Cardiology Why: at 2:30 Contact information: [address] [id number] [place], [place] Follow up.   Specialty: Home Health Services Why: will contact you to arrange outpatient Palliative Services Contact information: [address]  [place] [address] [phone]                 Discharge Exam: Filed Weights   09/26/21 0356 09/27/21 0330 09/28/21 0503  Weight: 54.6 kg 53.4 kg 53.2 kg   Vitals:   09/28/21 1113 09/28/21 1117  BP: (!) 133/57   Pulse: 81   Resp: 20    Temp: 99.3 F (37.4 C)   SpO2: 91% 95%   Examination: Physical Exam:  Constitutional: Thin chronically ill-appearing Caucasian female currently no acute distress appears calm and appears close to her baseline Respiratory: Diminished to auscultation bilaterally with coarse breath sounds and and some slight crackles., no wheezing, rales, rhonchi. Normal respiratory effort and patient is not tachypenic. No accessory muscle use.  Wearing home supplemental oxygen Cardiovascular: RRR, no murmurs / rubs / gallops. S1 and S2 auscultated.  1+ lower extremity edema Abdomen: Soft, non-tender, non-distended. Bowel sounds positive.  GU: Deferred.  Condition at discharge: stable  The results of significant diagnostics from this hospitalization (including imaging, microbiology, ancillary and laboratory) are listed below for reference.   Imaging Studies: CT CHEST WO CONTRAST  Result Date: 09/25/2021 CLINICAL DATA:  Pneumonia EXAM: CT CHEST WITHOUT CONTRAST TECHNIQUE: Multidetector CT imaging of the chest was performed following the standard protocol without IV contrast. RADIATION DOSE REDUCTION: This exam was performed according to the departmental dose-optimization program which includes automated exposure control, adjustment of the mA and/or kV according to patient size and/or use of iterative reconstruction technique. COMPARISON:  09/14/2021 FINDINGS: Cardiovascular: Heart is enlarged in size. Coronary artery calcifications are seen. There is ectasia of main pulmonary artery measuring 3.9 cm suggesting pulmonary arterial hypertension. Mediastinum/Nodes: There are slightly enlarged lymph nodes in mediastinum with no significant change. Left lobe of thyroid is enlarged with multiple nodules. Lungs/Pleura: Centrilobular emphysema is seen. There is interval increase in patchy infiltrates in both lower lung fields. There are small linear densities in right middle lobe and lingula with interval worsening. There  is linear density in the lumen of trachea, possibly mucous. There is interval appearance of small bilateral pleural effusions, more so on the right side. There is no pneumothorax. Upper Abdomen: There are multiple low-density lesions of varying sizes in the liver with no significant interval change. Small ascites is present Musculoskeletal: Compression fracture of upper endplate of body of L1 vertebra has not changed. IMPRESSION: COPD. There is interval increase in infiltrates in both lungs, especially in the lower lung fields suggesting atelectasis/pneumonia. There is interval appearance of small bilateral pleural effusions, more so on the right side. Cardiomegaly. Coronary artery calcifications are seen. Pulmonary arterial hypertension. There are multiple low-density lesions in the liver with no significant change suggesting possible cysts or hemangiomas. Small ascites. Left lobe of thyroid is enlarged with multiple nodules.  Follow-up thyroid sonogram may be considered. Electronically Signed   By: Elmer Picker M.D.   On: 09/25/2021 13:55   CT Chest High Resolution  Result Date: 09/16/2021 CLINICAL DATA:  88 year old female with history of pulmonary hypertension. Evaluate for interstitial lung disease. EXAM: CT CHEST WITHOUT CONTRAST TECHNIQUE: Multidetector CT imaging of the chest was performed following the standard protocol without intravenous contrast. High resolution imaging of the lungs, as well as inspiratory and expiratory imaging, was performed. RADIATION DOSE REDUCTION: This exam was performed according to the departmental dose-optimization program which includes automated exposure control, adjustment of the mA and/or kV according to patient size and/or use of iterative reconstruction technique. COMPARISON:  Chest CT 02/27/2018. FINDINGS: Cardiovascular: Heart size is mildly enlarged. There is no significant pericardial fluid, thickening or pericardial calcification. There is aortic  atherosclerosis, as well as atherosclerosis of the great vessels of the mediastinum and the coronary arteries, including calcified atherosclerotic plaque in the left main, left anterior descending, left circumflex and right coronary arteries. Mild calcifications of the aortic valve. Severe dilatation of the pulmonic trunk (3.8 cm in diameter). Mediastinum/Nodes: No pathologically enlarged mediastinal or hilar lymph nodes. Moderate-sized hiatal hernia. No axillary lymphadenopathy. Heterogeneously enlarged thyroid gland, most evident in the left lobe of the gland where there are multiple nodules, largest of which is partially calcified and heterogeneous in attenuation measuring 2.2 x 1.4 cm (axial image 23 of series 2). Lungs/Pleura: With several areas demonstrating thickening of the peribronchovascular interstitium, mild cylindrical bronchiectasis, peripheral bronchiolectasis, and regional architectural distortion and volume loss. These findings are increased compared to the prior study from 02/27/2018. Inspiratory and expiratory imaging demonstrates some mild air trapping indicative of mild small airways disease. No acute consolidative airspace disease. No pleural effusions. Upper Abdomen: Aortic atherosclerosis. Low-attenuation lesions scattered throughout the liver, incompletely characterized on today's non-contrast CT examination, but statistically likely to represent cysts, largest of which is in the right lobe of the liver measuring 4.4 x 3.3 cm. Extensive left-sided perinephric stranding (nonspecific). Musculoskeletal: Chronic appearing compression fracture of L1 with 50% loss of anterior vertebral body height. There are no aggressive appearing lytic or blastic lesions noted in the visualized portions of the skeleton. IMPRESSION: 1. The appearance of the lungs is compatible with a progressive interstitial lung disease, with a spectrum of findings categorized as probable usual interstitial pneumonia (UIP) per  current ATS guidelines. Repeat high-resolution chest CT is recommended in 12 months to assess for temporal changes in the appearance of the lung parenchyma. 2. Dilatation of the pulmonic trunk (3.8 cm in diameter), compatible with reported clinical history of pulmonary arterial hypertension. 3. Mild cardiomegaly. 4. Aortic atherosclerosis, in addition to left main and three-vessel coronary artery disease. 5. There are calcifications of the aortic valve. Echocardiographic correlation for evaluation of potential valvular dysfunction may be warranted if clinically indicated. 6. Heterogeneous thyroid gland with multiple thyroid nodules. The largest of these is heterogeneous in attenuation and partially calcified measuring 2.2 x 1.4 cm. Recommend thyroid US (ref: J Am Coll Radiol. 2015 Feb;12(2): 143-50). Aortic Atherosclerosis (ICD10-I70.0). Electronically Signed   By: Vinnie Langton M.D.   On: 09/16/2021 12:02   DG Chest Port 1 View  Result Date: 09/24/2021 CLINICAL DATA:  Shortness of breath EXAM: PORTABLE CHEST 1 VIEW COMPARISON:  09/14/2021 FINDINGS: Stable cardiomegaly. Aortic atherosclerosis. Patchy right lower lobe airspace opacity. Small right pleural effusion. Left lung base appears clear. No pneumothorax. Bony demineralization. IMPRESSION: Patchy right lower lobe airspace opacity and small right pleural effusion. Findings  most suggestive of pneumonia. Electronically Signed   By: Davina Poke D.O.   On: 09/24/2021 14:35   ECHOCARDIOGRAM COMPLETE  Result Date: 09/25/2021    ECHOCARDIOGRAM REPORT   Patient Name:   [name] Date of Exam: 09/25/2021 Medical Rec #:  [id number]      Height:       62.0 in Accession #:    [id number]     Weight:       120.6 lb Date of Birth:  1932/02/10       BSA:          1.542 m Patient Age:    88 years       BP:           112/35 mmHg Patient Gender: F              HR:           87 bpm. Exam Location:  Inpatient Procedure: 2D Echo, Cardiac Doppler and Color Doppler  Indications:    CHF  History:        Patient has prior history of Echocardiogram examinations. CAD,                 Pulmonary HTN; Risk Factors:Hypertension and Diabetes.  Sonographer:    Jyl Heinz Referring Phys: [id number] [place]  1. Left ventricular ejection fraction, by estimation, is 55 to 60%. The left ventricle has normal function. The left ventricle has no regional wall motion abnormalities. There is mild concentric left ventricular hypertrophy. Left ventricular diastolic parameters are consistent with Grade I diastolic dysfunction (impaired relaxation). There is the interventricular septum is flattened in systole, consistent with right ventricular pressure overload.  2. Right ventricular systolic function is mildly reduced. The right ventricular size is severely enlarged. Mildly increased right ventricular wall thickness. There is severely elevated pulmonary artery systolic pressure. The estimated right ventricular systolic pressure is 98.9 mmHg.  3. The mitral valve is normal in structure. No evidence of mitral valve regurgitation.  4. The aortic valve was not well visualized. There is mild calcification of the aortic valve. There is mild thickening of the aortic valve. Aortic valve regurgitation is not visualized. Aortic valve sclerosis/calcification is present, without any evidence of aortic stenosis. Comparison(s): No significant change from prior study. Prior images reviewed side by side. PA pressure was underestimated on the previous study due to poor TR jet. FINDINGS  Left Ventricle: Left ventricular ejection fraction, by estimation, is 55 to 60%. The left ventricle has normal function. The left ventricle has no regional wall motion abnormalities. The left ventricular internal cavity size was normal in size. There is  mild concentric left ventricular hypertrophy. The interventricular septum is flattened in systole, consistent with right ventricular pressure overload. Left  ventricular diastolic parameters are consistent with Grade I diastolic dysfunction (impaired relaxation). Normal left ventricular filling pressure. Right Ventricle: The right ventricular size is severely enlarged. Mildly increased right ventricular wall thickness. Right ventricular systolic function is mildly reduced. There is severely elevated pulmonary artery systolic pressure. The tricuspid regurgitant velocity is 3.51 m/s, and with an assumed right atrial pressure of 15 mmHg, the estimated right ventricular systolic pressure is 21.1 mmHg. Left Atrium: Left atrial size was normal in size. Right Atrium: Right atrial size was normal in size. Pericardium: There is no evidence of pericardial effusion. Mitral Valve: The mitral valve is normal in structure. No evidence of mitral valve regurgitation. Tricuspid Valve: The tricuspid valve is normal in structure. Tricuspid  valve regurgitation is mild. Aortic Valve: The aortic valve was not well visualized. There is mild calcification of the aortic valve. There is mild thickening of the aortic valve. Aortic valve regurgitation is not visualized. Aortic valve sclerosis/calcification is present, without any evidence of aortic stenosis. Aortic valve peak gradient measures 14.3 mmHg. Pulmonic Valve: The pulmonic valve was not well visualized. Pulmonic valve regurgitation is mild. Aorta: The aortic root and ascending aorta are structurally normal, with no evidence of dilitation. IAS/Shunts: No atrial level shunt detected by color flow Doppler.  LEFT VENTRICLE PLAX 2D LVIDd:         4.21 cm      Diastology LVIDs:         2.62 cm      LV e' lateral:   7.83 cm/s LV PW:         1.20 cm      LV E/e' lateral: 9.8 LV IVS:        1.17 cm LVOT diam:     2.00 cm LV SV:         76 LV SV Index:   49 LVOT Area:     3.14 cm  LV Volumes (MOD) LV vol d, MOD A2C: 107.0 ml LV vol d, MOD A4C: 86.2 ml LV vol s, MOD A2C: 45.6 ml LV vol s, MOD A4C: 36.2 ml LV SV MOD A2C:     61.4 ml LV SV MOD A4C:      86.2 ml LV SV MOD BP:      57.5 ml RIGHT VENTRICLE RV Basal diam:  4.29 cm RV Mid diam:    5.28 cm RV S prime:     9.25 cm/s TAPSE (M-mode): 1.2 cm LEFT ATRIUM             Index        RIGHT ATRIUM           Index LA diam:        3.50 cm 2.27 cm/m   RA Area:     17.70 cm LA Vol (A2C):   40.1 ml 26.01 ml/m  RA Volume:   51.80 ml  33.60 ml/m LA Vol (A4C):   35.4 ml 22.96 ml/m LA Biplane Vol: 37.9 ml 24.58 ml/m  AORTIC VALVE                 PULMONIC VALVE AV Area (Vmax): 1.91 cm     PV Vmax:       2.19 m/s AV Vmax:        189.00 cm/s  PV Peak grad:  19.2 mmHg AV Peak Grad:   14.3 mmHg LVOT Vmax:      115.00 cm/s LVOT Vmean:     81.300 cm/s LVOT VTI:       0.242 m  AORTA Ao Root diam: 3.20 cm Ao Asc diam:  3.30 cm MITRAL VALVE                TRICUSPID VALVE MV Area (PHT): 3.65 cm     TR Peak grad:   49.3 mmHg MV Decel Time: 208 msec     TR Vmax:        351.00 cm/s MV E velocity: 77.00 cm/s MV A velocity: 135.00 cm/s  SHUNTS MV E/A ratio:  0.57         Systemic VTI:  0.24 m  Systemic Diam: 2.00 cm Sanda Klein MD Electronically signed by Sanda Klein MD Signature Date/Time: 09/25/2021/12:07:19 PM    Final     Microbiology: Results for orders placed or performed during the hospital encounter of 09/24/21  Resp Panel by RT-PCR (Flu A&B, Covid) Nasopharyngeal Swab     Status: None   Collection Time: 09/24/21  2:07 PM   Specimen: Nasopharyngeal Swab; Nasopharyngeal(NP) swabs in vial transport medium  Result Value Ref Range Status   SARS Coronavirus 2 by RT PCR NEGATIVE NEGATIVE Final    Comment: (NOTE) SARS-CoV-2 target nucleic acids are NOT DETECTED.  The SARS-CoV-2 RNA is generally detectable in upper respiratory specimens during the acute phase of infection. The lowest concentration of SARS-CoV-2 viral copies this assay can detect is 138 copies/mL. A negative result does not preclude SARS-Cov-2 infection and should not be used as the sole basis for treatment or other  patient management decisions. A negative result may occur with  improper specimen collection/handling, submission of specimen other than nasopharyngeal swab, presence of viral mutation(s) within the areas targeted by this assay, and inadequate number of viral copies(<138 copies/mL). A negative result must be combined with clinical observations, patient history, and epidemiological information. The expected result is Negative.  Fact Sheet for Patients:  EntrepreneurPulse.com.au  Fact Sheet for Healthcare Providers:  IncredibleEmployment.be  This test is no t yet approved or cleared by the [place] FDA and  has been authorized for detection and/or diagnosis of SARS-CoV-2 by FDA under an Emergency Use Authorization (EUA). This EUA will remain  in effect (meaning this test can be used) for the duration of the COVID-19 declaration under Section 564(b)(1) of the Act, 21 U.S.C.section 360bbb-3(b)(1), unless the authorization is terminated  or revoked sooner.       Influenza A by PCR NEGATIVE NEGATIVE Final   Influenza B by PCR NEGATIVE NEGATIVE Final    Comment: (NOTE) The Xpert Xpress SARS-CoV-2/FLU/RSV plus assay is intended as an aid in the diagnosis of influenza from Nasopharyngeal swab specimens and should not be used as a sole basis for treatment. Nasal washings and aspirates are unacceptable for Xpert Xpress SARS-CoV-2/FLU/RSV testing.  Fact Sheet for Patients: EntrepreneurPulse.com.au  Fact Sheet for Healthcare Providers: IncredibleEmployment.be  This test is not yet approved or cleared by the [place] FDA and has been authorized for detection and/or diagnosis of SARS-CoV-2 by FDA under an Emergency Use Authorization (EUA). This EUA will remain in effect (meaning this test can be used) for the duration of the COVID-19 declaration under Section 564(b)(1) of the Act, 21 U.S.C. section  360bbb-3(b)(1), unless the authorization is terminated or revoked.  Performed at [facility], [place] [address], [place], [place] [address]   Respiratory (~20 pathogens) panel by PCR     Status: None   Collection Time: 09/24/21  2:07 PM   Specimen: Nasopharyngeal Swab; Respiratory  Result Value Ref Range Status   Adenovirus NOT DETECTED NOT DETECTED Final   Coronavirus 229E NOT DETECTED NOT DETECTED Final    Comment: (NOTE) The Coronavirus on the Respiratory Panel, DOES NOT test for the novel  Coronavirus (2019 nCoV)    Coronavirus HKU1 NOT DETECTED NOT DETECTED Final   Coronavirus NL63 NOT DETECTED NOT DETECTED Final   Coronavirus OC43 NOT DETECTED NOT DETECTED Final   Metapneumovirus NOT DETECTED NOT DETECTED Final   Rhinovirus / Enterovirus NOT DETECTED NOT DETECTED Final   Influenza A NOT DETECTED NOT DETECTED Final   Influenza B NOT DETECTED NOT DETECTED Final  Parainfluenza Virus 1 NOT DETECTED NOT DETECTED Final   Parainfluenza Virus 2 NOT DETECTED NOT DETECTED Final   Parainfluenza Virus 3 NOT DETECTED NOT DETECTED Final   Parainfluenza Virus 4 NOT DETECTED NOT DETECTED Final   Respiratory Syncytial Virus NOT DETECTED NOT DETECTED Final   Bordetella pertussis NOT DETECTED NOT DETECTED Final   Bordetella Parapertussis NOT DETECTED NOT DETECTED Final   Chlamydophila pneumoniae NOT DETECTED NOT DETECTED Final   Mycoplasma pneumoniae NOT DETECTED NOT DETECTED Final    Comment: Performed at [facility], [place] [address], [place], [place] [address]  Culture, blood (routine x 2)     Status: None   Collection Time: 09/24/21  5:19 PM   Specimen: BLOOD  Result Value Ref Range Status   Specimen Description BLOOD BLOOD LEFT ARM  Final   Special Requests   Final    BOTTLES DRAWN AEROBIC ONLY Blood Culture adequate volume   Culture   Final    NO GROWTH 5 DAYS Performed at [facility], [address] [place], [place] [address]    Report Status 09/29/2021 FINAL   Final  Culture, blood (routine x 2)     Status: Abnormal   Collection Time: 09/24/21  5:31 PM   Specimen: BLOOD  Result Value Ref Range Status   Specimen Description BLOOD LEFT ANTECUBITAL  Final   Special Requests   Final    BOTTLES DRAWN AEROBIC ONLY Blood Culture adequate volume   Culture  Setup Time   Final    GRAM POSITIVE COCCI IN CLUSTERS AEROBIC BOTTLE ONLY CRITICAL RESULT CALLED TO, READ BACK BY AND VERIFIED WITH: PHARM D E.MARTIN ON [id number] AT 0865 BY E.PARRISH    Culture (A)  Final    STAPHYLOCOCCUS HOMINIS THE SIGNIFICANCE OF ISOLATING THIS ORGANISM FROM A SINGLE SET OF BLOOD CULTURES WHEN MULTIPLE SETS ARE DRAWN IS UNCERTAIN. PLEASE NOTIFY THE MICROBIOLOGY DEPARTMENT WITHIN ONE WEEK IF SPECIATION AND SENSITIVITIES ARE REQUIRED. Performed at [facility], [place] [address] [place],  [address]    Report Status 09/27/2021 FINAL  Final  Blood Culture ID Panel (Reflexed)     Status: Abnormal   Collection Time: 09/24/21  5:31 PM  Result Value Ref Range Status   Enterococcus faecalis NOT DETECTED NOT DETECTED Final   Enterococcus Faecium NOT DETECTED NOT DETECTED Final   Listeria monocytogenes NOT DETECTED NOT DETECTED Final   Staphylococcus species DETECTED (A) NOT DETECTED Final    Comment: CRITICAL RESULT CALLED TO, READ BACK BY AND VERIFIED WITH: PHARM D E.MARTIN ON [id number] AT 1405 BY E.PARRISH    Staphylococcus aureus (BCID) NOT DETECTED NOT DETECTED Final   Staphylococcus epidermidis NOT DETECTED NOT DETECTED Final   Staphylococcus lugdunensis NOT DETECTED NOT DETECTED Final   Streptococcus species NOT DETECTED NOT DETECTED Final   Streptococcus agalactiae NOT DETECTED NOT DETECTED Final   Streptococcus pneumoniae NOT DETECTED NOT DETECTED Final   Streptococcus pyogenes NOT DETECTED NOT DETECTED Final   A.calcoaceticus-baumannii NOT DETECTED NOT DETECTED Final   Bacteroides fragilis NOT DETECTED NOT DETECTED Final   Enterobacterales NOT DETECTED NOT  DETECTED Final   Enterobacter cloacae complex NOT DETECTED NOT DETECTED Final   Escherichia coli NOT DETECTED NOT DETECTED Final   Klebsiella aerogenes NOT DETECTED NOT DETECTED Final   Klebsiella oxytoca NOT DETECTED NOT DETECTED Final   Klebsiella pneumoniae NOT DETECTED NOT DETECTED Final   Proteus species NOT DETECTED NOT DETECTED Final   Salmonella species NOT DETECTED NOT DETECTED Final  Serratia marcescens NOT DETECTED NOT DETECTED Final   Haemophilus influenzae NOT DETECTED NOT DETECTED Final   Neisseria meningitidis NOT DETECTED NOT DETECTED Final   Pseudomonas aeruginosa NOT DETECTED NOT DETECTED Final   Stenotrophomonas maltophilia NOT DETECTED NOT DETECTED Final   Candida albicans NOT DETECTED NOT DETECTED Final   Candida auris NOT DETECTED NOT DETECTED Final   Candida glabrata NOT DETECTED NOT DETECTED Final   Candida krusei NOT DETECTED NOT DETECTED Final   Candida parapsilosis NOT DETECTED NOT DETECTED Final   Candida tropicalis NOT DETECTED NOT DETECTED Final   Cryptococcus neoformans/gattii NOT DETECTED NOT DETECTED Final    Comment: Performed at [facility], [place] [address], [place], [place] [address]    Labs: CBC: Recent Labs  Lab 09/24/21 2234 09/25/21 0320 09/25/21 2347 09/27/21 0417 09/28/21 0451  WBC 6.6 5.8  --  4.3 8.9  HGB 7.3* 7.2* 9.5* 9.7* 9.3*  HCT 24.9* 25.4* 31.8* 32.3* 30.9*  MCV 87.4 88.2  --  84.8 84.4  PLT 314 305  --  318 254   Basic Metabolic Panel: Recent Labs  Lab 09/25/21 0320 09/26/21 0344 09/27/21 0417 09/28/21 0451  NA 138 141 143 143  K 3.9 3.6 3.9 3.3*  CL 103 102 103 107  CO2 _0 GLUCOSE 101* 109* 149* 119*  BUN 61* 41* 28* 26*  CREATININE 1.44* 1.12* 0.98 0.97  CALCIUM 8.3* 8.6* 9.1 9.2  MG  --   --   --  1.8  PHOS  --   --   --  2.6   Liver Function Tests: No results for input(s): AST, ALT, ALKPHOS, BILITOT, PROT, ALBUMIN in the last 168 hours.  CBG: Recent Labs  Lab 09/27/21 1107  09/27/21 1621 09/27/21 2103 09/28/21 0549 09/28/21 1112  GLUCAP 141* 187* 156* 126* 162*    Discharge time spent: greater than 30 minutes.  Signed: Raiford Noble, DO [facility] 10/01/2021

## 2021-09-28 NOTE — Consult Note (Signed)
Palliative Medicine Inpatient Consult Note  Consulting Provider: Domenic Polite, MD  Reason for consult:   [facility] Palliative [facility] Consult  Reason for Consult? goals of care   HPI:  Per intake H&P -->  89/F, chronically ill with chronic respiratory failure on 4 L home O2, severe PAH, interstitial lung disease, history of CAD, history of esophageal stricture, CVA, type 2 diabetes mellitus, chronic anemia was brought to the ED by her daughters on account of progressive weakness over the past week, also she was noted to be more hypoxic than usual,'s O2 sats in the 80s on 5 L O2, recently saw Dr. Jamey Reas with ILD, CT chest on 2/10 noted progressive interstitial lung disease.  Palliative care has been asked to get involved in the setting of chronic lung disease to further address goals of care.  Clinical Assessment/Goals of Care:  *Please note that this is a verbal dictation therefore any spelling or grammatical errors are due to the "[place] [facility]" system interpretation.  I have reviewed medical records including EPIC notes, labs and imaging, received report from bedside RN, assessed the patient who is lying in bed in no acute distress this morning.    I met with Darchelle and her daughter, [name] to further discuss diagnosis prognosis, GOC, EOL wishes, disposition and options.   I introduced Palliative Medicine as specialized medical care for people living with serious illness. It focuses on providing relief from the symptoms and stress of a serious illness. The goal is to improve quality of life for both the patient and the family.  Medical History Review and Understanding:  Takeila has a history of interstitial lung disease which she and her daughter understand to be a progressive illness which will worsen over time.  Coronary artery disease, type 2 diabetes, prior CVA, and diastolic heart failure.  Social History:  Margaree is from [facility].  Her husband  passed away about 15 years ago.  She has 3 children a son Marya Amsler who is disabled, a daughter [name] who lives in the house in front of her, and a daughter Caryl Asp who lives about 10 minutes away from her.  She has 5 grandchildren and 5 great-grandchildren were identified as the greatest joy in her life.  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  She is a woman of faith and practices within the [employer] denomination.  Functional and Nutritional State:  Donyel lives in a home and back of her daughter, [name].  She for the most part is able to do all BADLs for herself though her daughter does help with cleaning her home.  Yurika uses a cane when she is outdoors.  Shantasia suffers from gastric reflux though through the use of a PPI has a decrease in symptom burden.  She does have a fair appetite.  Palliative Symptoms:  This morning patient had some nausea which was remedied with some Zofran.  Dyspnea is present in the setting of her interstitial lung disease supplement  Advance Directives: A detailed discussion was had today regarding advanced directives.  Basilia Jumbo has never completed those both she and her daughter [name] are interested in doing so while she is present in the hospital.  Preeya would select her daughter [name] and joy to be her surrogate decision makers should she be unable to make decisions for herself  Code Status: Concepts specific to code status, artifical feeding and hydration, continued IV antibiotics and rehospitalization was had.  A MOST form was completed as below:  Cardiopulmonary Resuscitation: Do Not Attempt Resuscitation (DNR/No CPR)  Medical Interventions: Limited Additional Interventions: Use medical treatment, IV fluids and cardiac monitoring as indicated, DO NOT USE intubation or mechanical ventilation. May consider use of less invasive airway support such as BiPAP or CPAP. Also provide comfort measures. Transfer to the hospital if indicated. Avoid intensive care.    Antibiotics: Determine use of limitation of antibiotics when infection occurs  IV Fluids: IV fluids for a defined trial period  Feeding Tube: No feeding tube   I did broach the topic of hospice should [facility] admissions become more recurrent and her interstitial lung disease symptoms be more profound both she and her daughter would be amenable to this though for the time being outpatient palliative support will be ordered.  Patient's daughter [name] shares she used to work for hospice and has nothing but good things to say for their services.  Goals for the Future:  Juel most looks forward to getting home to spend time with her grandchildren and great-grandchildren.  She is very much aligned with the reality that we are not going to live forever and she is at peace with that  Discussed the importance of continued conversation with family and their  medical providers regarding overall plan of care and treatment options, ensuring decisions are within the context of the patients values and GOCs.  Decision Maker: Georgina Quint (daughter): [phone]  SUMMARY OF RECOMMENDATIONS   DNAR/DNI  MOST Completed, paper copy placed onto the chart electric copy can be found in [facility]  DNR Form Completed, paper copy placed onto the chart electric copy can be found in [place] our chaplain, Dorian Pod assisting in completion of these  Transitions of care team to arrange outpatient palliative support  Ongoing incremental palliative support during admission  Code Status/Advance Care Planning: DNAR/DNI   Symptom Management:  Nausea: Continue Zofran  Palliative Prophylaxis:  Aspiration, Bowel Regimen, Delirium Protocol, Frequent Pain Assessment, Oral Care, Palliative Wound Care, and Turn Reposition  Additional Recommendations (Limitations, Scope, Preferences): Treat what is treatable  Psycho-social/Spiritual:  Desire for further Chaplaincy support: Declines  had her own pastor, last night Additional Recommendations: Education on chronic diseases   Prognosis: Unclear at this time though she does suffer from multiple chronic comorbid conditions which place her at a higher 46-monthmortality risk.  Discharge Planning: Discharge home with home health.  Vitals:   09/28/21 0400 09/28/21 0503  BP:  (!) 142/74  Pulse: 98 (!) 109  Resp: 19 18  Temp:  97.8 F (36.6 C)  SpO2:  96%    Intake/Output Summary (Last 24 hours) at 09/28/2021 [id number] data filed at 09/28/2021 0500 Gross per 24 hour  Intake 460 ml  Output 750 ml  Net -290 ml   Last Weight  Most recent update: 09/28/2021  5:13 AM    Weight  53.2 kg (117 lb 4.6 oz)            Gen: Frail elderly female in no acute distress HEENT: moist mucous membranes CV: Regular rate and rhythm  PULM: On 4LPM [place] ABD: soft/nontender EXT: No edema Neuro: Alert and oriented x3  PPS: 60%   This conversation/these recommendations were discussed with patient primary care team, Dr. SAlfredia Ferguson MDM [facility] ______________________________________________________ [facility] Team Cell Phone: [phone] utilize secure chat with additional questions, if there is no response within 30 minutes please call the above phone number  Palliative Medicine Team providers are  available by phone from 7am to 7pm daily and can be reached through the team cell phone.  Should this patient require assistance outside of these hours, please call the patient's attending physician.

## 2021-09-28 NOTE — Progress Notes (Addendum)
Patient ID: [name], female   DOB: 10-Oct-1931, 88 y.o.   MRN: [id number]     Advanced Heart Failure Rounding Note  PCP-Cardiologist: Jenkins Rouge, MD   Subjective:    Remains on azithromycin + ceftriaxone. AF. WBCs not elevated.  CT chest 2/21 showed emphysema with new patchy infiltrates both lower lobes concerning for PNA.  GPCs growing in blood cultures => coag negative Staph.  PCT <0.10   Respiratory panel negative.  Started on steroids by CCM for ?component of CTD-related ILD flare  K low at 3.3   On 4 L [place] (home baseline), O2 sats 92%. Feels fairly comfortable from breathing standpoint. Wants to go home.   Palliative care consult pending.   Echo this admission: EF 55-60%, mild LVH, RV severely enlarged with mildly decreased systolic function, D-shaped setpum, PASP 64, IVC dilated.   Objective:   Weight Range: 53.2 kg Body mass index is 21.45 kg/m.   Vital Signs:   Temp:  [97.8 F (36.6 C)-98.6 F (37 C)] 97.8 F (36.6 C) (02/24 0503) Pulse Rate:  [93-109] 109 (02/24 0503) Resp:  [17-20] 18 (02/24 0503) BP: (127-145)/(53-81) 142/74 (02/24 0503) SpO2:  [92 %-96 %] 96 % (02/24 0503) Weight:  [53.2 kg] 53.2 kg (02/24 0503) Last BM Date : 09/27/21  Weight change: Filed Weights   09/26/21 0356 09/27/21 0330 09/28/21 0503  Weight: 54.6 kg 53.4 kg 53.2 kg    Intake/Output:   Intake/Output Summary (Last 24 hours) at 09/28/2021 0755 Last data filed at 09/28/2021 0500 Gross per 24 hour  Intake 460 ml  Output 750 ml  Net -290 ml      Physical Exam   General:  elderly WF. No respiratory difficulty HEENT: normal Neck: supple. JVD 9 cm. Carotids 2+ bilat; no bruits. No lymphadenopathy or thyromegaly appreciated. Cor: PMI nondisplaced. Regular rate & rhythm. No rubs, gallops or murmurs. Lungs: bibasilar crackles Abdomen: soft, nontender, nondistended. No hepatosplenomegaly. No bruits or masses. Good bowel sounds. Extremities: no cyanosis, clubbing, rash,  edema Neuro: alert & oriented x 3, cranial nerves grossly intact. moves all 4 extremities w/o difficulty. Affect pleasant.   Telemetry   NSR 70s, occasional PVCs   Labs    CBC Recent Labs    09/27/21 0417 09/28/21 0451  WBC 4.3 8.9  HGB 9.7* 9.3*  HCT 32.3* 30.9*  MCV 84.8 84.4  PLT 318 283   Basic Metabolic Panel Recent Labs    09/27/21 0417 09/28/21 0451  NA 143 143  K 3.9 3.3*  CL 103 107  CO2 28 27  GLUCOSE 149* 119*  BUN 28* 26*  CREATININE 0.98 0.97  CALCIUM 9.1 9.2   Liver Function Tests No results for input(s): AST, ALT, ALKPHOS, BILITOT, PROT, ALBUMIN in the last 72 hours.  No results for input(s): LIPASE, AMYLASE in the last 72 hours. Cardiac Enzymes No results for input(s): CKTOTAL, CKMB, CKMBINDEX, TROPONINI in the last 72 hours.  BNP: BNP (last 3 results) Recent Labs    12/29/20 1438 08/24/21 1049 09/24/21 1344  BNP 358.7* 280.8* 695.1*    ProBNP (last 3 results) No results for input(s): PROBNP in the last 8760 hours.   D-Dimer No results for input(s): DDIMER in the last 72 hours. Hemoglobin A1C No results for input(s): HGBA1C in the last 72 hours. Fasting Lipid Panel No results for input(s): CHOL, HDL, LDLCALC, TRIG, CHOLHDL, LDLDIRECT in the last 72 hours. Thyroid Function Tests No results for input(s): TSH, T4TOTAL, T3FREE, THYROIDAB in the last 72  hours.  Invalid input(s): FREET3  Other results:   Imaging    No results found.   Medications:     Scheduled Medications:  sodium chloride   Intravenous Once   aspirin EC  81 mg Oral Daily   atorvastatin  80 mg Oral Daily   azithromycin  500 mg Oral QPM   carvedilol  3.125 mg Oral BID WC   cyclobenzaprine  10 mg Oral q AM   enoxaparin (LOVENOX) injection  30 mg Subcutaneous Q24H   furosemide  40 mg Oral BID   insulin aspart  0-9 Units Subcutaneous TID WC   macitentan  10 mg Oral Daily   mirtazapine  30 mg Oral QHS   pantoprazole  40 mg Oral Daily   polyvinyl alcohol   1 drop Both Eyes BID   predniSONE  40 mg Oral Q breakfast   Followed by   Derrill Memo ON 09/30/2021] predniSONE  30 mg Oral Q breakfast   Followed by   Derrill Memo ON 10/04/2021] predniSONE  20 mg Oral Q breakfast   Followed by   Derrill Memo ON 10/08/2021] predniSONE  10 mg Oral Q breakfast   Riociguat  2.5 mg Oral TID   sodium chloride flush  3 mL Intravenous Q12H   Treprostinil Diolamine ER  0.125 mg Oral TID   And   Treprostinil Diolamine ER  0.75 mg Oral TID    Infusions:  sodium chloride     cefTRIAXone (ROCEPHIN)  IV 1 g (09/27/21 1658)    PRN Medications: sodium chloride, acetaminophen **OR** acetaminophen, ALPRAZolam, loperamide, melatonin, ondansetron (ZOFRAN) IV, sodium chloride, sodium chloride flush, traMADol-acetaminophen    Patient Profile   88 y/o woman with possible scleroderma/CREST with ILD and PAH, chronic diastolic HF,hiatal hernia and CAD, admitted for a/c hypoxic respiratory failure in the setting of large RLL PNA. Also w/ AKI, felt to be dehydrated.    Assessment/Plan   1. Chronic diastolic CHF:  Echo this admission with EF 55-60%, mild LVH, RV severely enlarged with mildly decreased systolic function, D-shaped septum, PASP 64, IVC dilated.  Suspect mild volume overload on exam. Creatinine normalized  - Volume status improved. Back on PO diuretics, continue Lasix 40 mg bid 2. Pulmonary hypertension: She appeared to have at least moderate pulmonary hypertension by echo in 7/19 with RV failure. CT chest in 2019 showed mild emphysema, which should not explain her degree of RV failure.  V/Q scan did not show evidence for chronic PEs. Anti-centromere antibody, ANA, and RF were all positive.  ?CREST variant of scleroderma.  She is on home oxygen 4L [place].  [place] 05/15/18 showed moderate PAH but very high PVR and low cardiac output.  This was concerning for advanced pulmonary hypertension.  Repeat echo in 3/21 showed moderately dilated/dysfunctional RV but PASP estimate was lower.  Echo  this admission with  EF 55-60%, mild LVH, RV severely enlarged with mildly decreased systolic function, D-shaped setpum, PASP 64, IVC dilated.  Recent chest CT 02/23 suggestive of progressive ILD. ? Scleroderma related ILD. Now follows with Pulmonary.  - She did not tolerate Uptravi.   - Continue riociguat, now on goal dose.  - Continue Opsumit 10 mg daily.  - Continue orenitram, she is unable to titrate any higher due to GI side effects.   - Refused sleep study. 3. Acute on chronic respiratory failure with hypoxia:  On 4L O2 at home, currently 15L HFNC.  Though PCT < 0.1 and WBCs not elevated, repeat CT chest shows new patchy infiltrates in  bilateral lower lobes concerning for PNA.  Swallow study without significant aspiration.  - Agree with PNA treatment with ceftriaxone/azithro. - Coag negative Staph is likely contaminant.  - Started on steroids by CCM for ?component of CTD-related ILD flare. On prednisone taper  - Incentive spirometry.  - Wean oxygen, keep sats > 88%.  4. AKI: Resolved.   5. CAD: Coronary angiography showed complex bifurcation lesion with 95% stenosis proximal LAD with calcification at take-off of large D2. The ostial/proximal D2 also had 95% calcified stenosis. Suspect this lesion played a role in her exertional dyspnea and also in her mildly decreased LV systolic function (EF 86-28% on echo in 7/19).  S/p successful atherectomy with bifurcation stenting of proximal LAD and D2 05/15/18.  Repeat LHC in 3/20 showed nonobstructive disease.  No chest pain.   - Continue ASA 81.  - Plavix stopped with anemia.   - Continue atorvastatin 6. ?CREST syndrome: Patient saw [facility]. Per her report, they did not think that she had CREST syndrome.    7. Anemia: Fe deficiency. No overt GI bleeding, per her report FOBT was negative.  She is being followed by hematology and is on erythropoeitin.  It is possible that the anemia comes from Opsumit or Adempas.  However, she improved  initially while still on these medications.  Would like to continue unless hematologist feels strongly about trying her off of one of them.  2/21 given 1 unit PRBCs . Also has had Feraheme.  - Transfuse Hgb < 7. - stable today.   Mobilize, PT/OT.   Length of Stay: [address] [place]  09/28/2021, 7:55 AM  Advanced Heart Failure Team Pager [phone] (M-F; 7a - 5p)  Please contact [facility] for night-coverage after hours (5p -7a ) and weekends on [email or web address]  Patient seen with PA, agree with the above note.   Breathing overall improved.  Now on prednisone taper for CTD-related ILD flare and abx for PNA.    General: NAD Neck: JVP 8-9 cm, no thyromegaly or thyroid nodule.  Lungs: Dry crackles at bases.  CV: Nondisplaced PMI.  Heart regular S1/S2, no S3/S4, no murmur.  No peripheral edema.   Abdomen: Soft, nontender, no hepatosplenomegaly, no distention.  Skin: Intact without lesions or rashes.  Neurologic: Alert and oriented x 3.  Psych: Normal affect. Extremities: No clubbing or cyanosis.  HEENT: Normal.   Stable today.  Would continue Lasix 40 mg bid.  Continue steroids and abx per pulmonary.  Plan for home today.  We will arrange followup.   Loralie Champagne 09/28/2021 1:52 PM

## 2021-09-28 NOTE — Progress Notes (Signed)
NAME:  [name], MRN:  [id number], DOB:  February 22, 1932, LOS: 4 ADMISSION DATE:  09/24/2021, CONSULTATION DATE:  2/22 REFERRING MD:  Dr. Broadus John, CHIEF COMPLAINT:  worsening dyspnea, cough  History of Present Illness:  88 y/o female with mutiple medical problems admitted with pneumonia in the context of severe PAH, chronic respiratory failure with hypoxemia, and likely underlying ILD.    Pertinent  Medical History  Diffuse parenchymal lung disease of undetermined etiology Anemia Chronic respiratory failure with hypoxemia DM2 Esophageal stricture GERD Hyperlipidemia Macular degeneration History of CVA  Significant Hospital Events: Including procedures, antibiotic start and stop dates in addition to other pertinent events   2/20: admitted to [facility] w/ hypoxic resp failure 2/24 down to baseline O2 requirement  Interim History / Subjective:  Feels better Slept OK  Objective   Blood pressure (!) 141/67, pulse 98, temperature 98.2 F (36.8 C), temperature source Oral, resp. rate 17, height 5' 2" (1.575 m), weight 53.2 kg, SpO2 93 %.        Intake/Output Summary (Last 24 hours) at 09/28/2021 1109 Last data filed at 09/28/2021 0500 Gross per 24 hour  Intake 400 ml  Output 650 ml  Net -250 ml   Filed Weights   09/26/21 0356 09/27/21 0330 09/28/21 0503  Weight: 54.6 kg 53.4 kg 53.2 kg    Examination: General:  Frail elderly female resting comfortably in bed HENT: NCAT OP clear PULM: Crackles upper lobes bilaterally, normal effort CV: RRR, no mgr GI: BS+, soft, nontender MSK: normal bulk and tone Neuro: awake, alert, no distress, MAEW    Resolved Hospital Problem list     Assessment & Plan:  Pulmonary arterial hypertension  Aspiration pneumonia in bilateral lower lobes of lungs, organism unspecified Centrilobular emphysema Diffuse parenchymal lung disease Acute on chronic respiratory failure with hypoxemia Moderate muscular deconditioning  Discussion Improving  from pneumonia standpoint.  Still not clear that there has been progression of her ILD.    Plan DNR Needs outpatient palliative follow up Home O2 evaluation with walking Continue 4L [place] at rest, exertional O2 to be determined with walking test Out of bed, encourage mobility Prednisone: d/c home with 35m x 4 d, 365mx4 day, 2096m4 d, 9m34mtil seen in pulmonary clinic  OK to d/c home, f/u with pulmonary as previously arranged  PCCM will sign off   Best Practice (right click and "Reselect all SmartList Selections" daily)   Code status DNR  I updated her daughter Joy Caryl Aspphone  Labs   CBC: Recent Labs  Lab 09/24/21 1344 09/24/21 2234 09/25/21 0320 09/25/21 2347 09/27/21 0417 09/28/21 0451  WBC 7.6 6.6 5.8  --  4.3 8.9  NEUTROABS 4.9  --   --   --   --   --   HGB 7.6* 7.3* 7.2* 9.5* 9.7* 9.3*  HCT 27.5* 24.9* 25.4* 31.8* 32.3* 30.9*  MCV 89.3 87.4 88.2  --  84.8 84.4  PLT 378 314 305  --  318 344 030Basic Metabolic Panel: Recent Labs  Lab 09/24/21 1344 09/24/21 1700 09/25/21 0320 09/26/21 0344 09/27/21 0417 09/28/21 0451  NA 134*  --  138 141 143 143  K 4.7  --  3.9 3.6 3.9 3.3*  CL 97*  --  103 102 103 107  CO2 27  --  _0 GLUCOSE 145*  --  101* 109* 149* 119*  BUN 66*  --  61* 41* 28* 26*  CREATININE 1.67*  --  1.44* 1.12* 0.98 0.97  CALCIUM 8.4*  --  8.3* 8.6* 9.1 9.2  MG  --  2.1  --   --   --  1.8  PHOS  --   --   --   --   --  2.6   GFR: Estimated Creatinine Clearance: 32.3 mL/min (by C-G formula based on SCr of 0.97 mg/dL). Recent Labs  Lab 09/24/21 1400 09/24/21 1700 09/24/21 2234 09/25/21 0320 09/26/21 0344 09/27/21 0417 09/28/21 0451  PROCALCITON  --  0.10  --  <0.10 <0.10  --   --   WBC  --   --  6.6 5.8  --  4.3 8.9  LATICACIDVEN 0.5  --   --   --   --   --   --     Liver Function Tests: Recent Labs  Lab 09/24/21 1344 09/24/21 1743  AST 21 22  ALT 30 31  ALKPHOS 46 47  BILITOT 0.2* 0.2*  PROT 5.8* 6.2*  ALBUMIN  2.6* 2.6*   No results for input(s): LIPASE, AMYLASE in the last 168 hours. No results for input(s): AMMONIA in the last 168 hours.  ABG    Component Value Date/Time   PHART 7.377 05/19/2018 1755   PCO2ART 36.3 05/19/2018 1755   PO2ART 50.4 (L) 05/19/2018 1755   HCO3 20.9 05/19/2018 1755   TCO2 27 05/14/2018 1141   TCO2 26 05/14/2018 1141   ACIDBASEDEF 3.4 (H) 05/19/2018 1755   O2SAT 82.5 05/19/2018 1755     Coagulation Profile: No results for input(s): INR, PROTIME in the last 168 hours.  Cardiac Enzymes: No results for input(s): CKTOTAL, CKMB, CKMBINDEX, TROPONINI in the last 168 hours.  HbA1C: Hgb A1c MFr Bld  Date/Time Value Ref Range Status  10/07/2018 02:03 AM 7.3 (H) 4.8 - 5.6 % Final    Comment:    (NOTE) Pre diabetes:          5.7%-6.4% Diabetes:              >6.4% Glycemic control for   <7.0% adults with diabetes   12/09/2016 01:03 PM 7.2 (H) 4.8 - 5.6 % Final    Comment:    (NOTE)         Pre-diabetes: 5.7 - 6.4         Diabetes: >6.4         Glycemic control for adults with diabetes: <7.0     CBG: Recent Labs  Lab 09/27/21 0633 09/27/21 1107 09/27/21 1621 09/27/21 2103 09/28/21 0549  GLUCAP 132* 141* 187* 156* 126*       Critical care time: n/a     Roselie Awkward, MD [facility] Pager: [phone] Cell: [phone] After 7:00 pm call Elink  [phone]

## 2021-09-28 NOTE — Progress Notes (Addendum)
Physical Therapy Treatment Patient Details Name: [name] MRN: [id number] DOB: September 12, 1931 Today's Date: 09/28/2021   History of Present Illness The pt is an 88 yo female presenting 2/20 with weakness and hypoxia. Upon work up, imaging suggestive of PNA and bilateral pleural effusions. PMH includes: chronic resp failure on 2L O2, ILD, HTN, CVA, DM II, CAD, arthritis, severe PAH, and CHF.    PT Comments    Pt received seated EOB, agreeable to therapy session, pt family members present. Pt given HEP handout for strengthening and instructed on frequency/technique, pt reports no questions. Pt able to perform short gait task in room with min guard to minA and HHA. Pt eager for upcoming DC home, defers longer gait trial recently worked with mobility tech. Pt continues to benefit from PT services to progress toward functional mobility goals.    Recommendations for follow up therapy are one component of a multi-disciplinary discharge planning process, led by the attending physician.  Recommendations may be updated based on patient status, additional functional criteria and insurance authorization.  Follow Up Recommendations  No PT follow up     Assistance Recommended at Discharge Intermittent Supervision/Assistance  Patient can return home with the following [name] with walking and/or transfers;Assistance with cooking/housework;Help with stairs or ramp for entrance;Assist for transportation   Equipment Recommendations  None recommended by PT    Recommendations for Other Services       Precautions / Restrictions Precautions Precautions: Fall Precaution Comments: Watch O2 Restrictions Weight Bearing Restrictions: No     Mobility  Bed Mobility Overal bed mobility: Modified Independent             General bed mobility comments: increased time and effot, no assist    Transfers Overall transfer level: Needs assistance Equipment used: 1 person hand held assist Transfers:  Sit to/from Stand Sit to Stand: Min guard           General transfer comment: minG for safety, pt able to stand and steady without assist.    Ambulation/Gait Ambulation/Gait assistance: Min assist Gait Distance (Feet): 20 Feet (x2 to/from toilet) Assistive device: 1 person hand held assist Gait Pattern/deviations: Step-through pattern, Decreased stride length Gait velocity: decreased     General Gait Details: small steps with narrow BOS. reliant on single UE support. no overt LOB   Stairs             Wheelchair Mobility    Modified Rankin (Stroke Patients Only)       Balance Overall balance assessment: Needs assistance Sitting-balance support: Feet supported Sitting balance-Leahy Scale: Good     Standing balance support: Single extremity supported Standing balance-Leahy Scale: Fair Standing balance comment: dependent on single UE support to maintain with gait.                            Cognition Arousal/Alertness: Awake/alert Behavior During Therapy: WFL for tasks assessed/performed Overall Cognitive Status: Within Functional Limits for tasks assessed                                 General Comments: participatory and motivated        Exercises Other Exercises Other Exercises: HEP handout reviewed/given to her link: [email or web address] Access Code: [email or web address] Other Exercises: Gait billed as TE for BLE strengthening    General Comments General comments (skin integrity, edema, etc.): SpO2 on 5L WFL, on  4L at rest; DOE 2/4 during gait      Pertinent Vitals/Pain Pain Assessment Pain Assessment: No/denies pain    Home Living Family/patient expects to be discharged to:: Private residence Living Arrangements: Alone                      Prior Function            PT Goals (current goals can now be found in the care plan section) Acute Rehab PT Goals Patient Stated Goal: return home with family support PT  Goal Formulation: With patient Time For Goal Achievement: 10/11/21 Progress towards PT goals: Progressing toward goals    Frequency    Min 3X/week      PT Plan Current plan remains appropriate       AM-PAC PT "6 Clicks" Mobility   Outcome Measure  Help needed turning from your back to your side while in a flat bed without using bedrails?: None Help needed moving from lying on your back to sitting on the side of a flat bed without using bedrails?: None Help needed moving to and from a bed to a chair (including a wheelchair)?: [name] needed standing up from a chair using your arms (e.g., wheelchair or bedside chair)?: [name] needed to walk in hospital room?: [name] needed climbing 3-5 steps with a railing? : A Little 6 Click Score: 20    End of Session Equipment Utilized During Treatment: Oxygen Activity Tolerance: Patient tolerated treatment well Patient left: in bed;with call bell/phone within reach;with family/visitor present (seated EOB to go over DC paperwork with RN) Nurse Communication: Mobility status PT Visit Diagnosis: Unsteadiness on feet (R26.81);Other abnormalities of gait and mobility (R26.89)     Time: 2446-2863 PT Time Calculation (min) (ACUTE ONLY): 10 min  Charges:  $Therapeutic Exercise: 8-22 mins                     Arhaan Chesnut P., PTA Acute Rehabilitation Services Pager: [phone] Office: [place] 09/28/2021, 4:49 PM

## 2021-09-28 NOTE — Plan of Care (Signed)
Problem: Education: Goal: Ability to demonstrate management of disease process will improve Outcome: Adequate for Discharge Goal: Ability to verbalize understanding of medication therapies will improve Outcome: Adequate for Discharge Goal: Individualized Educational Video(s) Outcome: Adequate for Discharge   Problem: Activity: Goal: Capacity to carry out activities will improve Outcome: Adequate for Discharge   Problem: Cardiac: Goal: Ability to achieve and maintain adequate cardiopulmonary perfusion will improve Outcome: Adequate for Discharge

## 2021-09-28 NOTE — Progress Notes (Signed)
This chaplain is present with the Pt., Pt. daughter, notary, and two witnesses for the notarizing of the Pt. Advance Directive:  HCPOA and Living Will.    The Pt. named [name] as her HCPOA.  If the HCPOA is unwilling or unable to serve the Pt. names Jen Mow as her next choice.  The chaplain gave the Pt. the original document along with two copies. The chaplain scanned a copy of the Pt. AD into EMR.  This chaplain is available for F/U spiritual care as needed.  Chaplain Sallyanne Kuster [phone]

## 2021-09-28 NOTE — Progress Notes (Signed)
Mobility Specialist Progress Note:   09/28/21 1400  Mobility  Activity Ambulated with assistance in hallway  Level of Assistance Standby assist, set-up cues, supervision of patient - no hands on  Assistive Device Front wheel walker  Distance Ambulated (ft) 52 ft  Activity Response Tolerated well  $Mobility charge 1 Mobility   Pt agreeable to hallway ambulation this afternoon. No physical assistance required. Pt back sitting EOB with all needs met, eager for d/c.  Nelta Numbers Acute Rehab Phone: [phone] Office Phone: [phone]

## 2021-09-28 NOTE — Progress Notes (Signed)
Occupational Therapy Treatment Patient Details Name: [name] MRN: [id number] DOB: 1931-12-29 Today's Date: 09/28/2021   History of present illness The pt is an 88 yo female presenting 2/20 with weakness and hypoxia. Upon work up, imaging suggestive of PNA and bilateral pleural effusions. PMH includes: chronic resp failure on 2L O2, ILD, HTN, CVA, DM II, CAD, arthritis, severe PAH, and CHF.   OT comments  Patient continues to make steady progress towards goals in skilled OT session. Patient's session encompassed functional mobility, marching in place, and ADLs at the sink. Patient with stable oxygen throughout, and no need for a seated reat break. Education provided to daughter with regard to support at home, and to increase activity tolerance. Therapy will continue to follow acutely.    Recommendations for follow up therapy are one component of a multi-disciplinary discharge planning process, led by the attending physician.  Recommendations may be updated based on patient status, additional functional criteria and insurance authorization.    Follow Up Recommendations  No OT follow up    Assistance Recommended at Discharge PRN  Patient can return home with the following      Equipment Recommendations  None recommended by OT    Recommendations for Other Services      Precautions / Restrictions Precautions Precautions: Fall Precaution Comments: Watch O2 Restrictions Weight Bearing Restrictions: No       Mobility Bed Mobility Overal bed mobility: Modified Independent                  Transfers Overall transfer level: Needs assistance Equipment used: Quad cane Transfers: Sit to/from Stand Sit to Stand: Min guard           General transfer comment: minG for safety, pt able to stand and steady without assist.     Balance Overall balance assessment: Needs assistance Sitting-balance support: Feet supported Sitting balance-Leahy Scale: Good     Standing  balance support: Single extremity supported Standing balance-Leahy Scale: Fair Standing balance comment: dependent on single UE support to maintain with gait.                           ADL either performed or assessed with clinical judgement   ADL                                         General ADL Comments: Completing oral care and grooming at sink after completing exercises at the bedside    Extremity/Trunk Assessment              Vision       Perception     Praxis      Cognition Arousal/Alertness: Awake/alert Behavior During Therapy: WFL for tasks assessed/performed Overall Cognitive Status: Within Functional Limits for tasks assessed                                 General Comments: participatory and motivated        Exercises      Shoulder Instructions       General Comments      Pertinent Vitals/ Pain       Pain Assessment Pain Assessment: No/denies pain  Home Living  Prior Functioning/Environment              Frequency  Min 2X/week        Progress Toward Goals  OT Goals(current goals can now be found in the care plan section)  Progress towards OT goals: Progressing toward goals  Acute Rehab OT Goals Patient Stated Goal: Get back home OT Goal Formulation: With patient Time For Goal Achievement: 10/10/21 Potential to Achieve Goals: Good  Plan Discharge plan remains appropriate    Co-evaluation                 AM-PAC OT "6 Clicks" Daily Activity     Outcome Measure   Help from another person eating meals?: None Help from another person taking care of personal grooming?: None Help from another person toileting, which includes using toliet, bedpan, or urinal?: [name] from another person bathing (including washing, rinsing, drying)?: [name] from another person to put on and taking off regular upper body  clothing?: None Help from another person to put on and taking off regular lower body clothing?: A Little 6 Click Score: 21    End of Session Equipment Utilized During Treatment: Oxygen;Gait belt;Other (comment) (Cane)  OT Visit Diagnosis: Unsteadiness on feet (R26.81)   Activity Tolerance Patient tolerated treatment well   Patient Left in bed;with call bell/phone within reach   Nurse Communication Mobility status        Time: 1015-1030 OT Time Calculation (min): 15 min  Charges: OT General Charges $OT Visit: 1 Visit OT Treatments $Self Care/Home Management : 8-22 mins  Corinne Ports E. Lilyan Prete, OTR/L Acute Rehabilitation Services [phone] [place] 09/28/2021, 11:23 AM

## 2021-09-29 LAB — CULTURE, BLOOD (ROUTINE X 2)
Culture: NO GROWTH
Special Requests: ADEQUATE

## 2021-10-01 ENCOUNTER — Telehealth: Payer: Self-pay

## 2021-10-01 NOTE — Telephone Encounter (Signed)
I notified Dr Bobby Rumpf that pt has been in hospital and received 1 unit blood. He feels she still needs to come in Thursday,10/04/2021,  to have labs and injection.    I spoke with Joy and notified her of Dr Bobby Rumpf' recommendations. They will be here as scheduled.

## 2021-10-01 NOTE — Assessment & Plan Note (Signed)
-  Continue home meds

## 2021-10-01 NOTE — Assessment & Plan Note (Signed)
-  Cardiology following as well as pulmonary -Continue home meds

## 2021-10-01 NOTE — Hospital Course (Signed)
The patient is an 88 year old chronically ill-appearing Caucasian female with past medical history significant for but not limited to chronic respiratory failure on 4 L of supplemental oxygen via home.,  Severe pulm arterial hypertension, history of interstitial lung disease, history of CAD, history of esophageal stricture, history of CVA, diabetes mellitus type 2, chronic anemia as well as other comorbidities who was presented to the ED by her daughter on account of progressive weakness over the last week as well as being more hypoxic than usual.  She noted to have O2 saturations in the 80s on 5 L of supplemental oxygen and recently saw Dr. Chase Caller with ILD chest CT on 09/14/2021 showed possible progressive interstitial lung disease however pulmonary felt that this was secondary to a community-acquired pneumonia.  In the ED she was more hypoxic and was placed on a nonrebreather and then transition to high flow nasal cannula 12 L.  Labs noted a low hemoglobin of 7.3 and a BUN/creatinine of 66/1.6.  Her BNP was elevated and her chest x-ray did show patchy right lower lobe opacity and she was started on antibiotics and admitted.  She admitted for acute on chronic respiratory failure and cardiology and both pulmonary were consulted.  She was diuresed significantly and palliative care also evaluated the patient with goals of care discussion being had with outpatient follow-up for palliative to be done.  She is steadily improved and was weaned back to close to her baseline and IV Lasix was changed to p.o. Lasix.  Pulmonary recommended a prednisone taper as well as completing her antibiotics which she did in the hospital.  They encouraged mobility and follow-up in outpatient setting with PCP and pulmonology.  Cardiology heart failure team recommended continuing Lasix 40 as well as twice daily dosing and she is deemed stable for discharge.

## 2021-10-01 NOTE — Assessment & Plan Note (Signed)
-  Continue with atorvastatin 80 mg p.o. daily

## 2021-10-01 NOTE — Assessment & Plan Note (Signed)
-  BUN/creatinine is relatively stable at 26/0.97 today -Avoid further nephrotoxic medications, contrast dyes, hypotension renally dose medications

## 2021-10-02 NOTE — TOC CM/SW Note (Addendum)
10/02/2021 131 pm Received notification from [facility] and they can accept referral with soc 10/04/2021, will contact pt and dtr to make aware. [facility] sent message today they did not accept referral.  Jonnie Finner RN3 CCM, Heart Failure TOC CM [phone]   [facility] declined [facility] declined [facility] declined

## 2021-10-03 ENCOUNTER — Other Ambulatory Visit: Payer: Self-pay | Admitting: Pharmacist

## 2021-10-04 ENCOUNTER — Other Ambulatory Visit: Payer: Self-pay | Admitting: Hematology and Oncology

## 2021-10-04 ENCOUNTER — Inpatient Hospital Stay (INDEPENDENT_AMBULATORY_CARE_PROVIDER_SITE_OTHER): Payer: Medicare Other

## 2021-10-04 ENCOUNTER — Inpatient Hospital Stay: Payer: Medicare Other | Attending: Oncology

## 2021-10-04 ENCOUNTER — Other Ambulatory Visit: Payer: Self-pay

## 2021-10-04 VITALS — BP 143/55 | HR 85 | Temp 98.7°F | Resp 20 | Wt 118.2 lb

## 2021-10-04 DIAGNOSIS — D539 Nutritional anemia, unspecified: Secondary | ICD-10-CM

## 2021-10-04 DIAGNOSIS — Z79899 Other long term (current) drug therapy: Secondary | ICD-10-CM | POA: Diagnosis present

## 2021-10-04 DIAGNOSIS — D631 Anemia in chronic kidney disease: Secondary | ICD-10-CM | POA: Diagnosis present

## 2021-10-04 DIAGNOSIS — N183 Chronic kidney disease, stage 3 unspecified: Secondary | ICD-10-CM | POA: Diagnosis present

## 2021-10-04 DIAGNOSIS — N189 Chronic kidney disease, unspecified: Secondary | ICD-10-CM

## 2021-10-04 LAB — CBC AND DIFFERENTIAL
HCT: 33 — AB (ref 36–46)
Hemoglobin: 10.3 — AB (ref 12.0–16.0)
Neutrophils Absolute: 8.54
Platelets: 259 (ref 150–399)
WBC: 9.6

## 2021-10-04 LAB — CBC
MCV: 84 (ref 81–99)
RBC: 39.4 — AB (ref 3.87–5.11)

## 2021-10-04 LAB — TSH: TSH: 0.99 u[IU]/mL (ref 0.350–4.500)

## 2021-10-04 MED ORDER — EPOETIN ALFA-EPBX 10000 UNIT/ML IJ SOLN: 10000.0000 [IU] | Freq: Once | INTRAMUSCULAR | Status: DC

## 2021-10-04 NOTE — Progress Notes (Signed)
1513: Patient discharged without need for injection per [facility] ?

## 2021-10-04 NOTE — Patient Instructions (Signed)
Epoetin Alfa injection ?What is this medication? ?EPOETIN ALFA (e POE e tin AL fa) helps your body make more red blood cells. This medicine is used to treat anemia caused by chronic kidney disease, cancer chemotherapy, or HIV-therapy. It may also be used before surgery if you have anemia. ?This medicine may be used for other purposes; ask your health care provider or pharmacist if you have questions. ?COMMON BRAND NAME(S): Epogen, Procrit, Retacrit ?What should I tell my care team before I take this medication? ?They need to know if you have any of these conditions: ?cancer ?heart disease ?high blood pressure ?history of blood clots ?history of stroke ?low levels of folate, iron, or vitamin B12 in the blood ?seizures ?an unusual or allergic reaction to erythropoietin, albumin, benzyl alcohol, hamster proteins, other medicines, foods, dyes, or preservatives ?pregnant or trying to get pregnant ?breast-feeding ?How should I use this medication? ?This medicine is for injection into a vein or under the skin. It is usually given by a health care professional in a hospital or clinic setting. ?If you get this medicine at home, you will be taught how to prepare and give this medicine. Use exactly as directed. Take your medicine at regular intervals. Do not take your medicine more often than directed. ?It is important that you put your used needles and syringes in a special sharps container. Do not put them in a trash can. If you do not have a sharps container, call your pharmacist or healthcare provider to get one. ?A special MedGuide will be given to you by the pharmacist with each prescription and refill. Be sure to read this information carefully each time. ?Talk to your pediatrician regarding the use of this medicine in children. While this drug may be prescribed for selected conditions, precautions do apply. ?Overdosage: If you think you have taken too much of this medicine contact a poison control center or emergency  room at once. ?NOTE: This medicine is only for you. Do not share this medicine with others. ?What if I miss a dose? ?If you miss a dose, take it as soon as you can. If it is almost time for your next dose, take only that dose. Do not take double or extra doses. ?What may interact with this medication? ?Interactions have not been studied. ?This list may not describe all possible interactions. Give your health care provider a list of all the medicines, herbs, non-prescription drugs, or dietary supplements you use. Also tell them if you smoke, drink alcohol, or use illegal drugs. Some items may interact with your medicine. ?What should I watch for while using this medication? ?Your condition will be monitored carefully while you are receiving this medicine. ?You may need blood work done while you are taking this medicine. ?This medicine may cause a decrease in vitamin B6. You should make sure that you get enough vitamin B6 while you are taking this medicine. Discuss the foods you eat and the vitamins you take with your health care professional. ?What side effects may I notice from receiving this medication? ?Side effects that you should report to your doctor or health care professional as soon as possible: ?allergic reactions like skin rash, itching or hives, swelling of the face, lips, or tongue ?seizures ?signs and symptoms of a blood clot such as breathing problems; changes in vision; chest pain; severe, sudden headache; pain, swelling, warmth in the leg; trouble speaking; sudden numbness or weakness of the face, arm or leg ?signs and symptoms of a stroke like   changes in vision; confusion; trouble speaking or understanding; severe headaches; sudden numbness or weakness of the face, arm or leg; trouble walking; dizziness; loss of balance or coordination °Side effects that usually do not require medical attention (report to your doctor or health care professional if they continue or are  bothersome): °chills °cough °dizziness °fever °headaches °joint pain °muscle cramps °muscle pain °nausea, vomiting °pain, redness, or irritation at site where injected °This list may not describe all possible side effects. Call your doctor for medical advice about side effects. You may report side effects to FDA at 1-800-FDA-1088. °Where should I keep my medication? °Keep out of the reach of children. °Store in a refrigerator between 2 and 8 degrees C (36 and 46 degrees F). Do not freeze or shake. Throw away any unused portion if using a single-dose vial. Multi-dose vials can be kept in the refrigerator for up to 21 days after the initial dose. Throw away unused medicine. °NOTE: This sheet is a summary. It may not cover all possible information. If you have questions about this medicine, talk to your doctor, pharmacist, or health care provider. °© 2022 [employer] (2017-03-25 00:00:00) ° °

## 2021-10-11 ENCOUNTER — Other Ambulatory Visit (HOSPITAL_COMMUNITY): Payer: Self-pay | Admitting: *Deleted

## 2021-10-11 MED ORDER — OPSUMIT 10 MG PO TABS
10.0000 mg | ORAL_TABLET | Freq: Every day | ORAL | 5 refills | Status: AC
Start: 2021-10-11 — End: ?

## 2021-10-12 ENCOUNTER — Other Ambulatory Visit (HOSPITAL_COMMUNITY): Payer: Self-pay | Admitting: Family Medicine

## 2021-10-12 ENCOUNTER — Ambulatory Visit (HOSPITAL_COMMUNITY)
Admission: RE | Admit: 2021-10-12 | Discharge: 2021-10-12 | Disposition: A | Discharge status: Home / Self Care | Payer: Medicare Other | Source: Ambulatory Visit | Attending: Family Medicine | Admitting: Family Medicine

## 2021-10-12 ENCOUNTER — Telehealth (HOSPITAL_COMMUNITY): Payer: Self-pay

## 2021-10-12 ENCOUNTER — Other Ambulatory Visit: Payer: Self-pay

## 2021-10-12 ENCOUNTER — Encounter (HOSPITAL_COMMUNITY): Payer: Self-pay

## 2021-10-12 VITALS — BP 116/52 | HR 57 | Wt 123.8 lb

## 2021-10-12 DIAGNOSIS — M341 CR(E)ST syndrome: Secondary | ICD-10-CM | POA: Insufficient documentation

## 2021-10-12 DIAGNOSIS — I5032 Chronic diastolic (congestive) heart failure: Secondary | ICD-10-CM | POA: Insufficient documentation

## 2021-10-12 DIAGNOSIS — J849 Interstitial pulmonary disease, unspecified: Secondary | ICD-10-CM | POA: Insufficient documentation

## 2021-10-12 DIAGNOSIS — J9610 Chronic respiratory failure, unspecified whether with hypoxia or hypercapnia: Secondary | ICD-10-CM

## 2021-10-12 DIAGNOSIS — J439 Emphysema, unspecified: Secondary | ICD-10-CM | POA: Insufficient documentation

## 2021-10-12 DIAGNOSIS — Z79899 Other long term (current) drug therapy: Secondary | ICD-10-CM | POA: Diagnosis not present

## 2021-10-12 DIAGNOSIS — D509 Iron deficiency anemia, unspecified: Secondary | ICD-10-CM | POA: Diagnosis not present

## 2021-10-12 DIAGNOSIS — I272 Pulmonary hypertension, unspecified: Secondary | ICD-10-CM | POA: Insufficient documentation

## 2021-10-12 DIAGNOSIS — I2510 Atherosclerotic heart disease of native coronary artery without angina pectoris: Secondary | ICD-10-CM | POA: Diagnosis not present

## 2021-10-12 DIAGNOSIS — I110 Hypertensive heart disease with heart failure: Secondary | ICD-10-CM | POA: Diagnosis not present

## 2021-10-12 DIAGNOSIS — I25119 Atherosclerotic heart disease of native coronary artery with unspecified angina pectoris: Secondary | ICD-10-CM | POA: Diagnosis not present

## 2021-10-12 DIAGNOSIS — J9611 Chronic respiratory failure with hypoxia: Secondary | ICD-10-CM | POA: Insufficient documentation

## 2021-10-12 DIAGNOSIS — E1169 Type 2 diabetes mellitus with other specified complication: Secondary | ICD-10-CM | POA: Insufficient documentation

## 2021-10-12 LAB — BASIC METABOLIC PANEL
Anion gap: 7 (ref 5–15)
BUN: 31 mg/dL — ABNORMAL HIGH (ref 8–23)
CO2: 33 mmol/L — ABNORMAL HIGH (ref 22–32)
Calcium: 8.5 mg/dL — ABNORMAL LOW (ref 8.9–10.3)
Chloride: 97 mmol/L — ABNORMAL LOW (ref 98–111)
Creatinine, Ser: 1.07 mg/dL — ABNORMAL HIGH (ref 0.44–1.00)
GFR, Estimated: 50 mL/min — ABNORMAL LOW (ref >60)
Glucose, Bld: 158 mg/dL — ABNORMAL HIGH (ref 70–99)
Potassium: 5.7 mmol/L — ABNORMAL HIGH (ref 3.5–5.1)
Sodium: 137 mmol/L (ref 135–145)

## 2021-10-12 MED ORDER — LOKELMA 10 G PO PACK: 10.0000 g | PACK | Freq: Once | ORAL | 0 refills | Status: AC

## 2021-10-12 NOTE — Telephone Encounter (Signed)
Paged by family as her usual pharmacy closed early today, unable to pick up [facility] for the hyperkalemia K 5.7, talking to her family, it appears her K was 5.7 at PCP's office on Monday as well, and it remained elevated today. [facility] to see if they have it, unfortunately they do not have it in stock. [facility] [place]. I spoke with Dr. Aundra Dubin, we will let her family pick up [facility] from her usual pharmacy first thing tomorrow morning. Family knows to call us if her usual pharmacy is still closed or they do not have it in stock so [facility] can [facility] [address] in [place] ?

## 2021-10-12 NOTE — Progress Notes (Signed)
ID:  BIRDELL FRASIER, DOB July 17, 1932, MRN [id number]   Provider location: [place] [facility] Type of Visit: Established patient   PCP:  [name], PA-C  Cardiologist:  Jenkins Rouge, MD HF Cardiology: Dr. Aundra Dubin   History of Present Illness: [name] is an 88 y.o. with history of DM, HTN, and prior osteomyelitis of jaw referred by Dr. Johnsie Cancel for evaluation of pulmonary hypertension/RV failure.  She reports an episode of PNA in 3/19 and says that she never recovered from this.  She had gradually progressive exertional dyspnea to the point where she was short of breath walking around the house.  She had an echo in 7/19 that showed mildly decreased LV systolic function but severely decreased RV systolic function with dilated RV and at least moderate pulmonary hypertension by echo estimation.  She saw her PCP in 8/19 and was noted to be hypoxemic at rest.  He started her on home oxygen 2 L/[place] which she continues.    She was referred to cardiology, saw Dr. Johnsie Cancel. Amlodipine was stopped and she was started empirically on sildenafil 20 mg tid for pulmonary hypertension.   She was referred to CHF clinic.    RHC/LHC was done in 10/19, showing complex severe bifurcation lesion involving the proximal LAD and large D2 ostium. She had complex PCI requiring atherectomy and DES to pLAD and ostial D2.  RHC showed PAH with PVR 11.6 WU and CI 1.7.  She subsequently was found to have a right groin pseudoaneurysm from cath.   She was started on Opsumit.  Serologies have suggested CREST syndrome.  However, she saw Dr. Amil Amen for rheumatology who told her that he did not think that she had a rheumatologic diagnosis (per her report).   Cardiolite was abnormal in 3/20, this was followed by [facility] in 3/20 that showed nonobstructive CAD.  Echo in 3/20 showed EF 55-60%, with moderately decreased RV systolic function, PASP 64 mmHg.   Echo (3/21) showed EF 55-60%, moderately dilated RV with  moderately decreased systolic function, PASP 38 mmHg. Echo in 5/22 showed EF 55-60%, basal inferior akinesis, mildly dilated RV with moderately decreased systolic function, D-shaped septum.   Last seen by Dr. Aundra Dubin 08/24/21. Home O2 need up to 4.5L. Appeared volume up. Lasix increased for 2 days. She was referred to Pulmonary d/t increasing hypoxemia and established with Dr. Chase Caller. High resolution chest CT 02/12 showed progressive ILD. Heterogenous thyroid with multiple nodules incidentally noted.  Admitted 2/23 with weakness and hypoxia. Treated for CAP and CTD-related ILD flare, and diuresed with IV lasix. Echo showed EF 55-60%, mild LVH, RV severely enlarged with mildly decreased systolic function, D-shaped septum, PASP 64, IVC dilated. She was seen by PMT and was DNR/DNI.  Today she returns for post hospital HF follow up with her daughter. Overall feeling great. She remains on 4L oxygen and is able to get around her house and complete ADLs without significant dyspnea. Denies palpitations, abnormal bleeding, CP, dizziness, edema, or PND/Orthopnea. Appetite improved. No fever or chills. Taking all medications. She is followed by [facility]. Daughter lives near by.   ECG (personally reviewed): NSR rBBB  6 minute walk (12/19): 79 m 6 minute walk (1/20): 121 m 6 minute walk (2/21): 91 m 6 minute walk (9/21): 152 m 6 minute walk (2/22): 152 m 6 minute walk (5/22): 259 m  Labs (12/19): K 3.9, creatinine 0.78 Labs (3/20): K 4, creatinine 1.1, hgb 10.2 Labs (12/20): LDL 67, TGs  28, K 4.4, creatinine 1.04 Labs (2/21): BNP 181, K 4, creatinine 0.94 Labs (5/21): K 4.2, creatinine 0.94, BNP 202 Labs (8/21): hgb 10.4 Labs (9/21): LDL 38, TGs 289 Labs (11/21): K 3.9, creatinine 0.84 Labs (12/21): K 4.9, creatinine 0.98, LDL 48, TGs 209, BNP 197 Labs (4/22): BNP 218, K 3.2, creatinine 0.98, hgb 8.3 Labs (6/22): K 4.8, creatinine 1.13 Labs (9/22): hgb 8.5 Labs (12/22): hgb 8.3, TSH  normal, K 5, creatinine 1.4 Labs (1/23): hgb 9.3 Labs (2/23): K 5.7, creatinine 1.10, hgb 9.7  PMH: 1. H/o osteomyelitis of jaw.  2. Duodenal ulcers with upper GI bleeding. 3. Type 2 diabetes 4. Hyperlipidemia 5. Squamous cell carcinoma of scalp with radiation and surgery.  6. GERD 7. H/o back surgery  8. HTN 9. Pulmonary hypertension/RV failure: Echo (7/19) with EF 45-50%, moderate focal basal septal hypertrophy, mild diffuse hypokinesis, septal flattening, severe RV dilation with severely decreased systolic function, moderate TR, PASP 61 mmHg.   - CT chest (7/19): Mild emphysema, enlarged PA.  - V/Q scan (8/19): No evidence for chronic PE.  - anti-centromere Ab positive, ANA positive, RF 73 => suspect CREST syndrome.  - RHC (10/19): mean RA 7, PA 64/21 mean 38, mean PCWP 8, CI 1.7, PVR 11.6 WU.  - Echo (3/20): EF 55-60%, moderately decreased RV systolic function, PASP 64 mmHg - Echo (3/21): EF 55-60%, moderately dilated RV with moderately decreased systolic function, PASP 38 mmHg. - Echo (5/22): EF 55-60%, basal inferior akinesis, mildly dilated RV with moderately decreased systolic function, D-shaped septum. 10. CAD: LHC (10/19) with 95% proximal LAD and 95% ostial D2.  Complicated bifurcation intervention with atherectomy and DES to proximal LAD and ostial D2.   - LHC (3/20): 40% mLAD, 30% mRCA.  11. Post-cath right groin pseudoaneurysm in 10/19.  12. Anemia: Fe deficiency.  FOBT negative.   SH: Lives in [place] alone, daughter lives very close.  Quit smoking > 20 years ago. No ETOH.   FH: No history of pulmonary hypertension.  Mother with "heart problems."  Sister with PPM.    Review of systems complete and found to be negative unless listed in HPI.   Current Outpatient Medications  Medication Sig Dispense Refill   ALPRAZolam (XANAX) 0.5 MG tablet Take 0.5 mg by mouth daily as needed for anxiety.      aspirin EC 81 MG tablet Take 1 tablet (81 mg total) by mouth daily. 30  tablet 11   atorvastatin (LIPITOR) 80 MG tablet Take 1 tablet (80 mg total) by mouth daily. 90 tablet 3   carvedilol (COREG) 3.125 MG tablet TAKE 1 TABLET BY MOUTH TWICE DAILY WITH A MEAL 60 tablet 11   cyclobenzaprine (FLEXERIL) 10 MG tablet Take 10 mg by mouth in the morning.     furosemide (LASIX) 40 MG tablet Take 1 tablet (40 mg total) by mouth 2 (two) times daily. 180 tablet 2   gabapentin (NEURONTIN) 300 MG capsule Take 300 mg by mouth daily as needed (nerve pain).     lactose free nutrition (BOOST) LIQD Take 237 mLs by mouth 2 (two) times daily between meals.      Melatonin 10 MG TABS Take 10 mg by mouth at bedtime as needed (sleep).     mirtazapine (REMERON) 30 MG tablet Take 30 mg by mouth at bedtime.     OPSUMIT 10 MG tablet Take 1 tablet (10 mg total) by mouth daily. 30 tablet 5   pantoprazole (PROTONIX) 40 MG tablet Take 1 tablet (40  mg total) by mouth daily. 30 tablet 0   Riociguat (ADEMPAS) 2.5 MG TABS Take 2.5 mg by mouth in the morning, at noon, and at bedtime. 180 tablet 3   sodium chloride (OCEAN) 0.65 % SOLN nasal spray Place 1 spray into both nostrils as needed for congestion. 60 mL 0   traMADol-acetaminophen (ULTRACET) 37.5-325 MG tablet Take 2 tablets by mouth 4 (four) times daily as needed for pain.  2   Treprostinil Diolamine ER 0.125 MG TBCR Take 1 tablet by mouth in the morning, at noon, and at bedtime.     Treprostinil Diolamine ER 0.25 MG TBCR Take 0.75 mg by mouth in the morning, at noon, and at bedtime. Takes three 0.30m and one 0.1259mtablet (0.87523motal) three times daily     Epoetin Alfa-epbx (RETACRIT IJ) Inject 1 Dose as directed every 21 ( twenty-one) days. (Patient not taking: Reported on 10/12/2021)     No current facility-administered medications for this encounter.   Wt Readings from Last 3 Encounters:  10/12/21 56.2 kg  10/04/21 53.6 kg  09/28/21 53.2 kg   BP (!) 116/52    Pulse (!) 57    Wt 56.2 kg    SpO2 100%    BMI 22.64 kg/m  General:  NAD.  No resp difficulty, arrived in [facility] oxygen, elderly HEENT: Normal Neck: Supple. No JVD. Carotids 2+ bilat; no bruits. No lymphadenopathy or thryomegaly appreciated. Cor: PMI nondisplaced. Regular rate & rhythm. No rubs, gallops or murmurs. Lungs: Coarse BS Abdomen: Soft, nontender, nondistended. No hepatosplenomegaly. No bruits or masses. Good bowel sounds. Extremities: No cyanosis, clubbing, rash, edema Neuro: Alert & oriented x 3, cranial nerves grossly intact. Moves all 4 extremities w/o difficulty. Affect pleasant.  Assessment/Plan: 1. Chronic diastolic CHF:  Echo this admission 2/23 with EF 55-60%, mild LVH, RV severely enlarged with mildly decreased systolic function, D-shaped septum, PASP 64, IVC dilated. Stable NYHA II-III, limited by physical deconditioning and ILD. She is not volume overloaded on exam. - Continue Lasix 40 mg bid. BMET today. - Continue carvedilol 3.125 mg bid. 2. Pulmonary hypertension: She appeared to have at least moderate pulmonary hypertension by echo in 7/19 with RV failure. CT chest in 2019 showed mild emphysema, which should not explain her degree of RV failure.  V/Q scan did not show evidence for chronic PEs. Anti-centromere antibody, ANA, and RF were all positive.  ?CREST variant of scleroderma.  She is on home oxygen 4L [place].  [place] showed moderate PAH but very high PVR and low cardiac output.  This was concerning for advanced pulmonary hypertension.  Repeat echo in 3/21 showed moderately dilated/dysfunctional RV but PASP estimate was lower.  Echo 2/23 EF 55-60%, mild LVH, RV severely enlarged with mildly decreased systolic function, D-shaped setpum, PASP 64, IVC dilated.  Recent chest CT 02/23 suggestive of progressive ILD. ? Scleroderma related ILD. Now follows with Pulmonary.  - She did not tolerate Uptravi.   - Continue riociguat, now on goal dose.  - Continue Opsumit 10 mg daily.  - Continue orenitram, she is unable to titrate any higher due to GI side  effects.   - Refused sleep study. 3. Chronic respiratory failure with hypoxia:  Recent admission treated for PNA. On 4L O2 at home, baseline. 4. CAD: Coronary angiography showed complex bifurcation lesion with 95% stenosis proximal LAD with calcification at take-off of large D2. The ostial/proximal D2 also had 95% calcified stenosis. Suspect this lesion played a role in her exertional dyspnea and  also in her mildly decreased LV systolic function (EF 61-48% on echo in 7/19).  S/p successful atherectomy with bifurcation stenting of proximal LAD and D2 05/15/18.  Repeat LHC in 3/20 showed nonobstructive disease.  No chest pain.   - Continue ASA 81.  - Plavix stopped with anemia.   - Continue atorvastatin. 5. ? CREST syndrome: Patient saw [facility]umatology. Per her report, they did not think that she had CREST syndrome.    6. Anemia: Fe deficiency. No overt GI bleeding, per her report FOBT was negative.  She is being followed by hematology and is on erythropoeitin.  It is possible that the anemia comes from Opsumit or Adempas.  However, she improved initially while still on these medications.  Would like to continue unless hematologist feels strongly about trying her off of one of them.  - CBC hgb 9.7 on labs at PCP visit 10/08/21. - TIBC 258, Iron 75, Iron Sat 29% on 10/08/21. - She has Heme/Onc follow up soon.  Follow up in 6-8 weeks with Dr. Aundra Dubin.  Allena Katz, FNP-BC 10/12/21

## 2021-10-12 NOTE — Telephone Encounter (Signed)
-----  Message from Rafael Bihari, [place] sent at 10/12/2021  4:01 PM EST ----- ?K elevated. She needs to take a dose of Lokelma 10 g today x 1. Repeat BMET in Monday. ? ?Remain off all KCL supplements. ?

## 2021-10-12 NOTE — Telephone Encounter (Signed)
Patient advised and verbalized understanding,patient will have repeat labs done at pcp. New Rx sent into patients pharmacy.  ?

## 2021-10-12 NOTE — Patient Instructions (Signed)
Stay off Potassium  ? ?Labs done today, your results will be available in MyChart, we will contact you for abnormal readings. ? ?Your physician recommends that you schedule a follow-up appointment in: 6-8 weeks ? ?If you have any questions or concerns before your next appointment please send [place] a message through [place] or call our office at [phone].   ? ?TO LEAVE A MESSAGE FOR THE NURSE SELECT OPTION 2, PLEASE LEAVE A MESSAGE INCLUDING: ?YOUR NAME ?DATE OF BIRTH ?CALL BACK NUMBER ?REASON FOR CALL**this is important as we prioritize the call backs ? ?YOU WILL RECEIVE A CALL BACK THE SAME DAY AS LONG AS YOU CALL BEFORE 4:00 PM ? ?At the [facility], you and your health needs are our priority. As part of our continuing mission to provide you with exceptional heart care, we have created designated Provider Care Teams. These Care Teams include your primary Cardiologist (physician) and Advanced Practice Providers (APPs- Physician Assistants and Nurse Practitioners) who all work together to provide you with the care you need, when you need it.  ? ?You may see any of the following providers on your designated Care Team at your next follow up: ?Dr Glori Bickers ?Dr Loralie Champagne ?Darrick Grinder, NP ?Lyda Jester, PA ?Jessica [place],NP ?Marlyce Huge, PA ?Audry Riles, PharmD ? ? ?Please be sure to bring in all your medications bottles to every appointment.  ? ? ?

## 2021-10-15 ENCOUNTER — Other Ambulatory Visit: Payer: Self-pay

## 2021-10-16 ENCOUNTER — Telehealth: Payer: Self-pay | Admitting: Cardiovascular Disease

## 2021-10-16 ENCOUNTER — Telehealth (HOSPITAL_COMMUNITY): Payer: Self-pay | Admitting: *Deleted

## 2021-10-16 NOTE — Telephone Encounter (Signed)
Roxanne with [facility] is with palliative care and she is trying to talk to someone at Dr. Claris Gladden office about getting oxygen perimeters for patient while on increased dose of Lasix. Will forward to Dr. Aundra Dubin and his team at HF clinic. ?

## 2021-10-16 NOTE — Telephone Encounter (Signed)
New Message: ? ? ? ?Please call Roxanne from [place]. She needs to give you an update on patient's condition for today. ?

## 2021-10-16 NOTE — Telephone Encounter (Signed)
Pts daughter left vm stating pt has swelling in her feet and asked if she can increase her diuretic for a day or two.  ? ?Routed to [facility] [place] for advice  ?

## 2021-10-16 NOTE — Telephone Encounter (Signed)
Need to keep O2 saturation > 88% ?

## 2021-10-16 NOTE — Telephone Encounter (Signed)
Left detailed vm for daughter Caryl Asp.  ?

## 2021-10-17 ENCOUNTER — Other Ambulatory Visit (HOSPITAL_COMMUNITY): Payer: Self-pay | Admitting: Cardiology

## 2021-10-17 ENCOUNTER — Encounter: Payer: Self-pay | Admitting: Oncology

## 2021-10-18 NOTE — Telephone Encounter (Signed)
Pts daughter called stating pt is still having swelling in her feet after taking extra lasix as advised. She asked if there was anything other changes we needed to make.  ? ?Routed to [employer] [place],FNP is out of the office)  ?

## 2021-10-19 NOTE — Telephone Encounter (Signed)
Spoke with daughter [name] she said pt is not short of breath. Lab order faxed to [facility].  ?

## 2021-10-19 NOTE — Telephone Encounter (Signed)
It could just be side effect from Adempas. Is she short of breath at all? ? ?Recommend labs, BNP and BMP ?Encourage leg elevation   ? ?Lyda Jester, PA-C ?10/19/2021 ? ? ? ?

## 2021-10-23 ENCOUNTER — Emergency Department (HOSPITAL_COMMUNITY): Payer: Medicare Other

## 2021-10-23 ENCOUNTER — Inpatient Hospital Stay (HOSPITAL_COMMUNITY)
Admission: EM | Admit: 2021-10-23 | Discharge: 2021-11-03 | DRG: 291 | Disposition: E | Payer: Medicare Other | Attending: Internal Medicine | Admitting: Internal Medicine

## 2021-10-23 ENCOUNTER — Encounter (HOSPITAL_COMMUNITY): Payer: Self-pay

## 2021-10-23 ENCOUNTER — Other Ambulatory Visit: Payer: Self-pay

## 2021-10-23 DIAGNOSIS — I5033 Acute on chronic diastolic (congestive) heart failure: Secondary | ICD-10-CM | POA: Diagnosis present

## 2021-10-23 DIAGNOSIS — E1122 Type 2 diabetes mellitus with diabetic chronic kidney disease: Secondary | ICD-10-CM | POA: Diagnosis present

## 2021-10-23 DIAGNOSIS — I2510 Atherosclerotic heart disease of native coronary artery without angina pectoris: Secondary | ICD-10-CM | POA: Diagnosis present

## 2021-10-23 DIAGNOSIS — F32A Depression, unspecified: Secondary | ICD-10-CM | POA: Diagnosis present

## 2021-10-23 DIAGNOSIS — R54 Age-related physical debility: Secondary | ICD-10-CM | POA: Diagnosis present

## 2021-10-23 DIAGNOSIS — I5082 Biventricular heart failure: Secondary | ICD-10-CM | POA: Diagnosis present

## 2021-10-23 DIAGNOSIS — F419 Anxiety disorder, unspecified: Secondary | ICD-10-CM | POA: Diagnosis present

## 2021-10-23 DIAGNOSIS — Z9981 Dependence on supplemental oxygen: Secondary | ICD-10-CM

## 2021-10-23 DIAGNOSIS — I4510 Unspecified right bundle-branch block: Secondary | ICD-10-CM | POA: Diagnosis present

## 2021-10-23 DIAGNOSIS — N179 Acute kidney failure, unspecified: Secondary | ICD-10-CM | POA: Diagnosis present

## 2021-10-23 DIAGNOSIS — Z515 Encounter for palliative care: Secondary | ICD-10-CM | POA: Diagnosis not present

## 2021-10-23 DIAGNOSIS — I2721 Secondary pulmonary arterial hypertension: Secondary | ICD-10-CM | POA: Diagnosis present

## 2021-10-23 DIAGNOSIS — J849 Interstitial pulmonary disease, unspecified: Secondary | ICD-10-CM | POA: Diagnosis present

## 2021-10-23 DIAGNOSIS — Z8673 Personal history of transient ischemic attack (TIA), and cerebral infarction without residual deficits: Secondary | ICD-10-CM

## 2021-10-23 DIAGNOSIS — Z66 Do not resuscitate: Secondary | ICD-10-CM | POA: Diagnosis present

## 2021-10-23 DIAGNOSIS — N189 Chronic kidney disease, unspecified: Secondary | ICD-10-CM | POA: Diagnosis not present

## 2021-10-23 DIAGNOSIS — E8729 Other acidosis: Secondary | ICD-10-CM | POA: Diagnosis present

## 2021-10-23 DIAGNOSIS — J90 Pleural effusion, not elsewhere classified: Secondary | ICD-10-CM | POA: Diagnosis present

## 2021-10-23 DIAGNOSIS — J9621 Acute and chronic respiratory failure with hypoxia: Secondary | ICD-10-CM | POA: Diagnosis present

## 2021-10-23 DIAGNOSIS — Z79899 Other long term (current) drug therapy: Secondary | ICD-10-CM | POA: Diagnosis not present

## 2021-10-23 DIAGNOSIS — I509 Heart failure, unspecified: Secondary | ICD-10-CM

## 2021-10-23 DIAGNOSIS — I272 Pulmonary hypertension, unspecified: Secondary | ICD-10-CM | POA: Diagnosis present

## 2021-10-23 DIAGNOSIS — D631 Anemia in chronic kidney disease: Secondary | ICD-10-CM | POA: Diagnosis present

## 2021-10-23 DIAGNOSIS — E875 Hyperkalemia: Secondary | ICD-10-CM | POA: Diagnosis present

## 2021-10-23 DIAGNOSIS — N1831 Chronic kidney disease, stage 3a: Secondary | ICD-10-CM | POA: Diagnosis present

## 2021-10-23 DIAGNOSIS — R0689 Other abnormalities of breathing: Secondary | ICD-10-CM

## 2021-10-23 DIAGNOSIS — Z955 Presence of coronary angioplasty implant and graft: Secondary | ICD-10-CM

## 2021-10-23 DIAGNOSIS — J9811 Atelectasis: Secondary | ICD-10-CM | POA: Diagnosis present

## 2021-10-23 DIAGNOSIS — J984 Other disorders of lung: Secondary | ICD-10-CM

## 2021-10-23 DIAGNOSIS — R0902 Hypoxemia: Principal | ICD-10-CM

## 2021-10-23 DIAGNOSIS — Z87891 Personal history of nicotine dependence: Secondary | ICD-10-CM | POA: Diagnosis not present

## 2021-10-23 DIAGNOSIS — J9622 Acute and chronic respiratory failure with hypercapnia: Secondary | ICD-10-CM | POA: Diagnosis present

## 2021-10-23 DIAGNOSIS — I25119 Atherosclerotic heart disease of native coronary artery with unspecified angina pectoris: Secondary | ICD-10-CM | POA: Diagnosis not present

## 2021-10-23 DIAGNOSIS — Z882 Allergy status to sulfonamides status: Secondary | ICD-10-CM

## 2021-10-23 DIAGNOSIS — Z7189 Other specified counseling: Secondary | ICD-10-CM

## 2021-10-23 DIAGNOSIS — Z20822 Contact with and (suspected) exposure to covid-19: Secondary | ICD-10-CM | POA: Diagnosis present

## 2021-10-23 DIAGNOSIS — I130 Hypertensive heart and chronic kidney disease with heart failure and stage 1 through stage 4 chronic kidney disease, or unspecified chronic kidney disease: Secondary | ICD-10-CM | POA: Diagnosis present

## 2021-10-23 DIAGNOSIS — Z79890 Hormone replacement therapy (postmenopausal): Secondary | ICD-10-CM

## 2021-10-23 DIAGNOSIS — Z7982 Long term (current) use of aspirin: Secondary | ICD-10-CM

## 2021-10-23 LAB — I-STAT ARTERIAL BLOOD GAS, ED
Acid-Base Excess: 6 mmol/L — ABNORMAL HIGH (ref 0.0–2.0)
Bicarbonate: 34.4 mmol/L — ABNORMAL HIGH (ref 20.0–28.0)
Calcium, Ion: 1.26 mmol/L (ref 1.15–1.40)
HCT: 27 % — ABNORMAL LOW (ref 36.0–46.0)
Hemoglobin: 9.2 g/dL — ABNORMAL LOW (ref 12.0–15.0)
O2 Saturation: 76 %
Patient temperature: 98.5
Potassium: 4.8 mmol/L (ref 3.5–5.1)
Sodium: 135 mmol/L (ref 135–145)
TCO2: 37 mmol/L — ABNORMAL HIGH (ref 22–32)
pCO2 arterial: 73.3 mmHg (ref 32–48)
pH, Arterial: 7.28 — ABNORMAL LOW (ref 7.35–7.45)
pO2, Arterial: 48 mmHg — ABNORMAL LOW (ref 83–108)

## 2021-10-23 LAB — BASIC METABOLIC PANEL
Anion gap: 9 (ref 5–15)
BUN: 35 mg/dL — ABNORMAL HIGH (ref 8–23)
CO2: 31 mmol/L (ref 22–32)
Calcium: 8.8 mg/dL — ABNORMAL LOW (ref 8.9–10.3)
Chloride: 95 mmol/L — ABNORMAL LOW (ref 98–111)
Creatinine, Ser: 1.49 mg/dL — ABNORMAL HIGH (ref 0.44–1.00)
GFR, Estimated: 34 mL/min — ABNORMAL LOW (ref >60)
Glucose, Bld: 121 mg/dL — ABNORMAL HIGH (ref 70–99)
Potassium: 4.7 mmol/L (ref 3.5–5.1)
Sodium: 135 mmol/L (ref 135–145)

## 2021-10-23 LAB — CBC
HCT: 30.3 % — ABNORMAL LOW (ref 36.0–46.0)
Hemoglobin: 8.5 g/dL — ABNORMAL LOW (ref 12.0–15.0)
MCH: 26.7 pg (ref 26.0–34.0)
MCHC: 28.1 g/dL — ABNORMAL LOW (ref 30.0–36.0)
MCV: 95.3 fL (ref 80.0–100.0)
Platelets: 211 10*3/uL (ref 150–400)
RBC: 3.18 MIL/uL — ABNORMAL LOW (ref 3.87–5.11)
RDW: 23.6 % — ABNORMAL HIGH (ref 11.5–15.5)
WBC: 5.2 10*3/uL (ref 4.0–10.5)
nRBC: 0 % (ref 0.0–0.2)

## 2021-10-23 LAB — RESP PANEL BY RT-PCR (FLU A&B, COVID) ARPGX2
Influenza A by PCR: NEGATIVE
Influenza B by PCR: NEGATIVE
SARS Coronavirus 2 by RT PCR: NEGATIVE

## 2021-10-23 LAB — BRAIN NATRIURETIC PEPTIDE: B Natriuretic Peptide: 694.6 pg/mL — ABNORMAL HIGH (ref 0.0–100.0)

## 2021-10-23 LAB — D-DIMER, QUANTITATIVE: D-Dimer, Quant: 2.65 ug/mL-FEU — ABNORMAL HIGH (ref 0.00–0.50)

## 2021-10-23 MED ORDER — SENNOSIDES-DOCUSATE SODIUM 8.6-50 MG PO TABS: 1.0000 | ORAL_TABLET | Freq: Every evening | ORAL | Status: DC | PRN

## 2021-10-23 MED ORDER — ACETAMINOPHEN 650 MG RE SUPP: 650.0000 mg | Freq: Four times a day (QID) | RECTAL | Status: DC | PRN

## 2021-10-23 MED ORDER — RIOCIGUAT 2.5 MG PO TABS
2.5000 mg | ORAL_TABLET | Freq: Three times a day (TID) | ORAL | Status: DC
  Administered 2021-10-24 – 2021-10-26 (×4): 2.5 mg via ORAL
  Filled 2021-10-23 (×6): qty 1

## 2021-10-23 MED ORDER — SODIUM CHLORIDE 0.9 % IV BOLUS
500.0000 mL | Freq: Once | INTRAVENOUS | Status: AC
  Administered 2021-10-23: 500 mL via INTRAVENOUS

## 2021-10-23 MED ORDER — TREPROSTINIL DIOLAMINE ER 0.125 MG PO TBCR
0.1250 | EXTENDED_RELEASE_TABLET | Freq: Three times a day (TID) | ORAL | Status: DC
  Administered 2021-10-24 – 2021-10-25 (×2): 0.125 via ORAL
  Administered 2021-10-25: 0.125 mg via ORAL
  Administered 2021-10-25: 0.125 via ORAL
  Filled 2021-10-23 (×6): qty 1

## 2021-10-23 MED ORDER — FUROSEMIDE 10 MG/ML IJ SOLN
40.0000 mg | Freq: Two times a day (BID) | INTRAMUSCULAR | Status: DC
  Administered 2021-10-24: 40 mg via INTRAVENOUS
  Filled 2021-10-23: qty 4

## 2021-10-23 MED ORDER — SODIUM CHLORIDE 0.9 % IV SOLN
500.0000 mg | Freq: Once | INTRAVENOUS | Status: AC
Start: 2021-10-23 — End: 2021-10-24
  Administered 2021-10-23: 500 mg via INTRAVENOUS
  Filled 2021-10-23: qty 5

## 2021-10-23 MED ORDER — ENOXAPARIN SODIUM 30 MG/0.3ML IJ SOSY
30.0000 mg | PREFILLED_SYRINGE | INTRAMUSCULAR | Status: DC
  Administered 2021-10-24 – 2021-10-25 (×2): 30 mg via SUBCUTANEOUS
  Filled 2021-10-23 (×2): qty 0.3

## 2021-10-23 MED ORDER — GABAPENTIN 300 MG PO CAPS
300.0000 mg | ORAL_CAPSULE | Freq: Every day | ORAL | Status: DC
Start: 2021-10-24 — End: 2021-10-26
  Administered 2021-10-25: 300 mg via ORAL
  Filled 2021-10-23 (×2): qty 1

## 2021-10-23 MED ORDER — ASPIRIN EC 81 MG PO TBEC
81.0000 mg | DELAYED_RELEASE_TABLET | Freq: Every day | ORAL | Status: DC
  Administered 2021-10-24 – 2021-10-25 (×2): 81 mg via ORAL
  Filled 2021-10-23 (×2): qty 1

## 2021-10-23 MED ORDER — MIRTAZAPINE 15 MG PO TABS
30.0000 mg | ORAL_TABLET | Freq: Every day | ORAL | Status: DC
  Administered 2021-10-24 – 2021-10-25 (×2): 30 mg via ORAL
  Filled 2021-10-23 (×2): qty 2

## 2021-10-23 MED ORDER — FUROSEMIDE 10 MG/ML IJ SOLN
40.0000 mg | Freq: Once | INTRAMUSCULAR | Status: AC
  Administered 2021-10-23: 40 mg via INTRAVENOUS
  Filled 2021-10-23: qty 4

## 2021-10-23 MED ORDER — ATORVASTATIN CALCIUM 80 MG PO TABS
80.0000 mg | ORAL_TABLET | Freq: Every day | ORAL | Status: DC
  Administered 2021-10-24 – 2021-10-25 (×2): 80 mg via ORAL
  Filled 2021-10-23 (×2): qty 1

## 2021-10-23 MED ORDER — MACITENTAN 10 MG PO TABS
10.0000 mg | ORAL_TABLET | Freq: Every day | ORAL | Status: DC
  Administered 2021-10-24 – 2021-10-25 (×2): 10 mg via ORAL
  Filled 2021-10-23 (×3): qty 1

## 2021-10-23 MED ORDER — SODIUM CHLORIDE 0.9% FLUSH
3.0000 mL | Freq: Two times a day (BID) | INTRAVENOUS | Status: DC
  Administered 2021-10-24 – 2021-10-26 (×4): 3 mL via INTRAVENOUS

## 2021-10-23 MED ORDER — ALPRAZOLAM 0.5 MG PO TABS
0.5000 mg | ORAL_TABLET | Freq: Every day | ORAL | Status: DC
  Administered 2021-10-24 – 2021-10-25 (×2): 0.5 mg via ORAL
  Filled 2021-10-23 (×2): qty 1

## 2021-10-23 MED ORDER — ONDANSETRON HCL 4 MG PO TABS: 4.0000 mg | ORAL_TABLET | Freq: Four times a day (QID) | ORAL | Status: DC | PRN

## 2021-10-23 MED ORDER — ACETAMINOPHEN 325 MG PO TABS
650.0000 mg | ORAL_TABLET | Freq: Four times a day (QID) | ORAL | Status: DC | PRN
  Filled 2021-10-23: qty 2

## 2021-10-23 MED ORDER — CARVEDILOL 3.125 MG PO TABS
3.1250 mg | ORAL_TABLET | Freq: Two times a day (BID) | ORAL | Status: DC
Start: 2021-10-24 — End: 2021-10-26
  Administered 2021-10-24 – 2021-10-25 (×4): 3.125 mg via ORAL
  Filled 2021-10-23 (×4): qty 1

## 2021-10-23 MED ORDER — CYCLOBENZAPRINE HCL 10 MG PO TABS
10.0000 mg | ORAL_TABLET | Freq: Every morning | ORAL | Status: DC
  Administered 2021-10-24 – 2021-10-25 (×2): 10 mg via ORAL
  Filled 2021-10-23 (×2): qty 1

## 2021-10-23 MED ORDER — IOHEXOL 350 MG/ML SOLN
75.0000 mL | Freq: Once | INTRAVENOUS | Status: AC | PRN
  Administered 2021-10-23: 75 mL via INTRAVENOUS

## 2021-10-23 MED ORDER — SODIUM CHLORIDE 0.9 % IV SOLN
1.0000 g | Freq: Once | INTRAVENOUS | Status: AC
  Administered 2021-10-23: 1 g via INTRAVENOUS
  Filled 2021-10-23: qty 10

## 2021-10-23 MED ORDER — ONDANSETRON HCL 4 MG/2ML IJ SOLN: 4.0000 mg | Freq: Four times a day (QID) | INTRAMUSCULAR | Status: DC | PRN

## 2021-10-23 MED ORDER — TREPROSTINIL DIOLAMINE ER 0.25 MG PO TBCR
0.7500 mg | EXTENDED_RELEASE_TABLET | Freq: Three times a day (TID) | ORAL | Status: DC
  Administered 2021-10-24 – 2021-10-26 (×4): 0.75 mg via ORAL
  Filled 2021-10-23 (×6): qty 3

## 2021-10-23 NOTE — Assessment & Plan Note (Deleted)
Hyperkalemia.  ?Worsening renal function with serum cr at 2.37 with K at 3,9 and serum bicarbonate at 30. ?Plan to hold on furosemide for now and continue close monitoring on renal function and electrolytes.  ? ?Patient with poor oral intake, consult nutrition for recommendations.  ?

## 2021-10-23 NOTE — H&P (Incomplete)
?History and Physical  ? ? ?JAQUELINE UBER OFB:[id number] DOB: 1931-10-13 DOA: 10/07/2021 ? ?PCP: [name], PA-C  ?Patient coming from: Home via EMS ? ?I have personally briefly reviewed patient's old medical records in [place] ? ?Chief Complaint: Respiratory distress ? ?HPI: ?[name] is a 88 y.o. female with medical history significant for chronic hypoxic respiratory failure on 4 L O2 via [place] at home, HFpEF (EF 55-60%, G1DD by TTE 09/25/2021), severe pulmonary hypertension, interstitial lung disease, CAD s/p prior DES to LAD, CKD stage IIIa, anemia of chronic disease, history of CVA, T2DM, HTN, HLD, depression/anxiety who presented to the ED for evaluation of respiratory distress. ? ?Patient recently admitted 09/24/2021-09/28/2021 for acute on chronic hypoxic respiratory failure multifactorial secondary to ILD, acute on chronic HFpEF, community-acquired pneumonia in setting of severe PAH.  She was treated with IV Lasix diuresis, antibiotics, and steroids with prednisone taper at discharge.  She was discharged with palliative care/hospice follow-up. ? ?*** ? ?ED Course  Labs/Imaging on admission: I have personally reviewed following labs and imaging studies. ? ?Initial vitals showed BP 105/42, pulse 86, RR 26, temp 98.5 ?F, SPO2 96% on 15 L via NRB.  Patient subsequently placed on 12 L HFNC and then BiPAP to maintain SPO2 >92%. ? ?Labs show WBC 5.2, hemoglobin 8.5, platelets 211,000, sodium 135, potassium 4.7, bicarb 31, BUN 35, creatinine 1.49 (baseline 0.9-1.1), serum glucose 121, D-dimer 2.65. ? ?SARS-CoV-2 and influenza PCR negative.  ABG showed pH 7.28, PCO2 73.3, PO2 48. ? ?Portable chest x-ray showed persistent bilateral lower lung zone airspace opacities. ? ?CTA chest PE study was negative for evidence of PE.  Cardiomegaly with mild interstitial edema and moderate bilateral pleural effusions noted.  Associated lingular and bilateral lower lobe atelectasis seen. ? ?Patient was initially given  500 cc normal saline and then received IV Lasix 40 mg, IV ceftriaxone, and azithromycin.  The hospitalist service was consulted to admit for further evaluation and management. ? ?Review of Systems: All systems reviewed and are negative except as documented in history of present illness above. ? ? ?Past Medical History:  ?Diagnosis Date  ? Anemia   ? Anxiety   ? Arthritis   ? osteo; "mostly hands, knees, probably back too" (12/09/2016)  ? Chronic lower back pain   ? Complication of anesthesia 2011  ? resp distress -on vent after surgery  ? Coronary artery disease   ? Depression   ? Diabetes ([place])   ? Esophageal stricture   ? GERD (gastroesophageal reflux disease)   ? Headache   ? out grew them  ? High cholesterol   ? History of blood transfusion 2011; ?03/2012  ? "related to ORs" (12/09/2016)  ? Hypertension   ? Intestinal obstruction (HCC)   ? Macular degeneration of left eye   ? dx over 55 yrs ago.....hasn't changed much  ? Shingles   ? Squamous carcinoma 2013  ? squamas cell on scalp--took 14 radiation tx--2013  ? Stroke [facility]) 1967  ? Mini stroke;  No lasting deficits  ? Type II diabetes mellitus ([place]) dx'd 11/2016  ? ? ?Past Surgical History:  ?Procedure Laterality Date  ? ABDOMINAL HYSTERECTOMY    ? APPENDECTOMY    ? BACK SURGERY    ? BALLOON DILATION N/A 06/24/2020  ? Procedure: BALLOON DILATION;  Surgeon: Clarene Essex, MD;  Location: [facility] ENDOSCOPY;  Service: Endoscopy;  Laterality: N/A;  ? CATARACT EXTRACTION, BILATERAL Bilateral   ? CHOLECYSTECTOMY    ? COLON SURGERY    ?  COLONOSCOPY    ? CORONARY ATHERECTOMY N/A 05/15/2018  ? Procedure: CORONARY ATHERECTOMY;  Surgeon: Leonie Man, MD;  Location: [place] CV LAB;  Service: Cardiovascular;  Laterality: N/A;  ? CORONARY STENT INTERVENTION N/A 05/15/2018  ? Procedure: CORONARY STENT INTERVENTION;  Surgeon: Leonie Man, MD;  Location: [place] CV LAB;  Service: Cardiovascular;  Laterality: N/A;  ? ESOPHAGOGASTRODUODENOSCOPY N/A 06/24/2020  ? Procedure:  ESOPHAGOGASTRODUODENOSCOPY (EGD);  Surgeon: Clarene Essex, MD;  Location: [place];  Service: Endoscopy;  Laterality: N/A;  ? ESOPHAGOGASTRODUODENOSCOPY (EGD) WITH ESOPHAGEAL DILATION  "several times"  ? ESOPHAGOGASTRODUODENOSCOPY (EGD) WITH PROPOFOL N/A 12/31/2016  ? Procedure: ESOPHAGOGASTRODUODENOSCOPY (EGD) WITH PROPOFOL;  Surgeon: Wilford Corner, MD;  Location: [place];  Service: Endoscopy;  Laterality: N/A;  ? EYE SURGERY    ? FRACTURE SURGERY    ? IR FLUORO GUIDE CV LINE RIGHT  01/02/2017  ? IR US GUIDE VASC ACCESS RIGHT  01/02/2017  ? JOINT REPLACEMENT    ? LEFT HEART CATH AND CORONARY ANGIOGRAPHY N/A 10/09/2018  ? Procedure: LEFT HEART CATH AND CORONARY ANGIOGRAPHY;  Surgeon: Larey Dresser, MD;  Location: [place] CV LAB;  Service: Cardiovascular;  Laterality: N/A;  ? LUMBAR [place] SURGERY  10/25/2014  ? Right L4-L5 removal of seven free fragments of disk mostly posterolaterally.  Lysis of adhesion.  Microscope/notes 10/26/2014  ? LUMBAR LAMINECTOMY/DECOMPRESSION MICRODISCECTOMY Right 08/09/2014  ? Procedure: Right Lumbar Four to Five Microdiskectomy;  Surgeon: Floyce Stakes, MD;  Location: [facility] NEURO ORS;  Service: Neurosurgery;  Laterality: Right;  Right L4-5 Microdiskectomy  ? ORIF PERIPROSTHETIC FRACTURE  03/31/2012  ? Procedure: OPEN REDUCTION INTERNAL FIXATION (ORIF) PERIPROSTHETIC FRACTURE;  Surgeon: Mauri Pole, MD;  Location: [facility] ORS;  Service: Orthopedics;  Laterality: Left;  Open reduction internal fixation Left distal femur periprosthetic fracture  ? RIGHT/LEFT HEART CATH AND CORONARY ANGIOGRAPHY N/A 05/14/2018  ? Procedure: RIGHT/LEFT HEART CATH AND CORONARY ANGIOGRAPHY;  Surgeon: Larey Dresser, MD;  Location: [place] CV LAB;  Service: Cardiovascular;  Laterality: N/A;  ? SMALL INTESTINE SURGERY  2011  ? "really bad blockage; went into respiratory distress and was on ventilator after surgery; [facility]"  ? SQUAMOUS CELL CARCINOMA EXCISION  ~ 2012  ? S/P "cut off her head  then 15 radiation txs"   ? TOTAL KNEE ARTHROPLASTY Bilateral   ? bilateral  ? ? ?Social History: ? reports that she quit smoking about 30 years ago. Her smoking use included cigarettes. She has a 1.00 pack-year smoking history. She has never used smokeless tobacco. She reports that she does not drink alcohol and does not use drugs. ? ?Allergies  ?Allergen Reactions  ? Sulfa Antibiotics Swelling  ?  Mouth and tongue swelling  ? ? ?History reviewed. No pertinent family history. ? ? ?Prior to Admission medications   ?Medication Sig Start Date End Date Taking? Authorizing Provider  ?ALPRAZolam (XANAX) 0.5 MG tablet Take 0.5 mg by mouth daily as needed for anxiety.  09/14/19   [provider]  ?aspirin EC 81 MG tablet Take 1 tablet (81 mg total) by mouth daily. 05/16/18   Leanor Kail, PA  ?atorvastatin (LIPITOR) 80 MG tablet Take 1 tablet (80 mg total) by mouth daily. 12/06/20   Larey Dresser, MD  ?carvedilol (COREG) 3.125 MG tablet TAKE 1 TABLET BY MOUTH TWICE DAILY WITH A MEAL 09/03/21   Larey Dresser, MD  ?cyclobenzaprine (FLEXERIL) 10 MG tablet Take 10 mg by mouth in the morning.    [provider]  ?Epoetin Alfa-epbx (RETACRIT IJ) Inject 1 Dose as directed every 21 ( twenty-one) days. ?Patient not taking: Reported on 10/12/2021    [provider]  ?furosemide (LASIX) 40 MG tablet Take 1 tablet (40 mg total) by mouth 2 (two) times daily. 08/24/21   Larey Dresser, MD  ?gabapentin (NEURONTIN) 300 MG capsule Take 300 mg by mouth daily as needed (nerve pain).    [provider]  ?lactose free nutrition (BOOST) LIQD Take 237 mLs by mouth 2 (two) times daily between meals.     [provider]  ?Melatonin 10 MG TABS Take 10 mg by mouth at bedtime as needed (sleep).    [provider]  ?mirtazapine (REMERON) 30 MG tablet Take 30 mg by mouth at bedtime.    [provider]  ?OPSUMIT 10 MG tablet Take 1 tablet (10 mg total) by mouth daily. 10/11/21   Larey Dresser, MD  ?pantoprazole (PROTONIX) 40 MG tablet Take 1 tablet (40 mg total) by mouth daily. 09/29/21   Kerney Elbe, DO  ?Riociguat (ADEMPAS) 2.5 MG TABS Take 2.5 mg by mouth in the morning, a

## 2021-10-23 NOTE — Assessment & Plan Note (Deleted)
Continue aspirin and atorvastatin. ?

## 2021-10-23 NOTE — Progress Notes (Signed)
RT note: RT and RN transported BIPAP patient to CT and back to ED. Vital signs stable through out. ? ?

## 2021-10-23 NOTE — Assessment & Plan Note (Deleted)
Severe pulmonary artery hypertension.  ? ?Patient has been treated for pneumonia on last hospitalization.  ?She has a right lower lobe infiltrate and positive bronchiectasis.  ?Continue with high oxygen requirements.  ? ?Plan to check procalcitonin ?Aggressive airway clearing techniques with chest PT, flutter valve and incentive spirometer. ?Out of bed to chair tid with meals. ?Add scheduled bronchodilator therapy and inhaled corticosteroids.  ?Continue with macitentan and teprostenil.   ?On Riociguat.  ? ?

## 2021-10-23 NOTE — Progress Notes (Signed)
ABG results on 12L [place].  Results given to MD and orders placed for patient to be on bipap.  Patient placed on bipap and is currently tolerating well.  Will continue to monitor.  ? ? Latest Reference Range & Units 10/27/2021 17:37  ?Sample type  ARTERIAL  ?pH, Arterial 7.35 - 7.45  7.280 (L)  ?pCO2 arterial 32 - 48 mmHg 73.3 (HH)  ?pO2, Arterial 83 - 108 mmHg 48 (L)  ?TCO2 22 - 32 mmol/L 37 (H)  ?Acid-Base Excess 0.0 - 2.0 mmol/L 6.0 (H)  ?Bicarbonate 20.0 - 28.0 mmol/L 34.4 (H)  ?O2 Saturation % 76  ?Patient temperature  98.5 F  ?Collection site  RADIAL, ALLEN'S TEST ACCEPTABLE  ? ?

## 2021-10-23 NOTE — Assessment & Plan Note (Deleted)
Continue home alprazolam and Remeron. ?

## 2021-10-23 NOTE — Assessment & Plan Note (Deleted)
Multifactorial hypoxemic respiratory failure. ? ?Her oxygenation this am is 92% on HFNC 30 L/min and 100% Fi02. ? ?Patient has pulmonary hypertension and right heart failure, possible ILD, bronchiectasis.  ?Continue supplemental 02 per HFNC to target 02 saturation 88% or greater.  ?

## 2021-10-23 NOTE — H&P (Signed)
?History and Physical  ? ? ?[name]:[id number] DOB: 1932/07/05 DOA: 10/15/2021 ? ?PCP: [name], PA-C  ?Patient coming from: Home via EMS ? ?I have personally briefly reviewed patient's old medical records in [place] ? ?Chief Complaint: Respiratory distress ? ?HPI: ?[name] is a 88 y.o. female with medical history significant for chronic hypoxic respiratory failure on 4 L O2 via [place] at home, HFpEF (EF 55-60%, G1DD by TTE 09/25/2021), severe pulmonary hypertension, interstitial lung disease, CAD s/p prior DES to LAD, CKD stage IIIa, anemia of chronic disease, history of CVA, T2DM, HTN, HLD, depression/anxiety who presented to the ED for evaluation of respiratory distress.  History is limited from patient due to somnolence while on BiPAP and is otherwise supplemented by EDP, chart review, and patient's daughter at bedside. ? ?Patient recently admitted 09/24/2021-09/28/2021 for acute on chronic hypoxic respiratory failure multifactorial secondary to ILD, acute on chronic HFpEF, community-acquired pneumonia in setting of severe PAH.  She was treated with IV Lasix diuresis, antibiotics, and steroids with prednisone taper at discharge.  She was discharged with palliative care/hospice follow-up. ? ?Patient was initially doing fairly well at time of discharge however over the last 2 weeks she has had progressive decline.  She has been having increased oxygen requirement from her baseline 4 L via [place].  Today she was noted to be in respiratory distress with SPO2 dropping into the low 70s while on 5 L O2 via [place].  Palliative care/hospice nurse saw patient at home and discussed options of staying at home and just focusing on comfort versus further medical management with evaluation in the ED.  Patient ultimately decided that she did want further medical management.  She and her daughters however are clear that they would not want escalation of care or aggressive measures such as intubation or CPR. ? ?ED  Course  Labs/Imaging on admission: I have personally reviewed following labs and imaging studies. ? ?Initial vitals showed BP 105/42, pulse 86, RR 26, temp 98.5 ?F, SPO2 96% on 15 L via NRB.  Patient subsequently placed on 12 L HFNC and then BiPAP to maintain SPO2 >92%. ? ?Labs show WBC 5.2, hemoglobin 8.5, platelets 211,000, sodium 135, potassium 4.7, bicarb 31, BUN 35, creatinine 1.49 (baseline 0.9-1.1), serum glucose 121, D-dimer 2.65. ? ?SARS-CoV-2 and influenza PCR negative.  ABG showed pH 7.28, PCO2 73.3, PO2 48. ? ?Portable chest x-ray showed persistent bilateral lower lung zone airspace opacities. ? ?CTA chest PE study was negative for evidence of PE.  Cardiomegaly with mild interstitial edema and moderate bilateral pleural effusions noted.  Associated lingular and bilateral lower lobe atelectasis seen. ? ?Patient was initially given 500 cc normal saline and then received IV Lasix 40 mg, IV ceftriaxone, and azithromycin.  The hospitalist service was consulted to admit for further evaluation and management. ? ?Review of Systems: All systems reviewed and are negative except as documented in history of present illness above. ? ? ?Past Medical History:  ?Diagnosis Date  ? Anemia   ? Anxiety   ? Arthritis   ? osteo; "mostly hands, knees, probably back too" (12/09/2016)  ? Chronic lower back pain   ? Complication of anesthesia 2011  ? resp distress -on vent after surgery  ? Coronary artery disease   ? Depression   ? Diabetes ([place])   ? Esophageal stricture   ? GERD (gastroesophageal reflux disease)   ? Headache   ? out grew them  ? High cholesterol   ? History  of blood transfusion 2011; ?03/2012  ? "related to ORs" (12/09/2016)  ? Hypertension   ? Intestinal obstruction (HCC)   ? Macular degeneration of left eye   ? dx over 55 yrs ago.....hasn't changed much  ? Shingles   ? Squamous carcinoma 2013  ? squamas cell on scalp--took 14 radiation tx--2013  ? Stroke [facility]) 1967  ? Mini stroke;  No lasting deficits  ? Type II  diabetes mellitus ([place]) dx'd 11/2016  ? ? ?Past Surgical History:  ?Procedure Laterality Date  ? ABDOMINAL HYSTERECTOMY    ? APPENDECTOMY    ? BACK SURGERY    ? BALLOON DILATION N/A 06/24/2020  ? Procedure: BALLOON DILATION;  Surgeon: Clarene Essex, MD;  Location: [facility] ENDOSCOPY;  Service: Endoscopy;  Laterality: N/A;  ? CATARACT EXTRACTION, BILATERAL Bilateral   ? CHOLECYSTECTOMY    ? COLON SURGERY    ? COLONOSCOPY    ? CORONARY ATHERECTOMY N/A 05/15/2018  ? Procedure: CORONARY ATHERECTOMY;  Surgeon: Leonie Man, MD;  Location: [place] CV LAB;  Service: Cardiovascular;  Laterality: N/A;  ? CORONARY STENT INTERVENTION N/A 05/15/2018  ? Procedure: CORONARY STENT INTERVENTION;  Surgeon: Leonie Man, MD;  Location: [place] CV LAB;  Service: Cardiovascular;  Laterality: N/A;  ? ESOPHAGOGASTRODUODENOSCOPY N/A 06/24/2020  ? Procedure: ESOPHAGOGASTRODUODENOSCOPY (EGD);  Surgeon: Clarene Essex, MD;  Location: [place];  Service: Endoscopy;  Laterality: N/A;  ? ESOPHAGOGASTRODUODENOSCOPY (EGD) WITH ESOPHAGEAL DILATION  "several times"  ? ESOPHAGOGASTRODUODENOSCOPY (EGD) WITH PROPOFOL N/A 12/31/2016  ? Procedure: ESOPHAGOGASTRODUODENOSCOPY (EGD) WITH PROPOFOL;  Surgeon: Wilford Corner, MD;  Location: [facility];  Service: Endoscopy;  Laterality: N/A;  ? EYE SURGERY    ? FRACTURE SURGERY    ? IR FLUORO GUIDE CV LINE RIGHT  01/02/2017  ? IR US GUIDE VASC ACCESS RIGHT  01/02/2017  ? JOINT REPLACEMENT    ? LEFT HEART CATH AND CORONARY ANGIOGRAPHY N/A 10/09/2018  ? Procedure: LEFT HEART CATH AND CORONARY ANGIOGRAPHY;  Surgeon: Larey Dresser, MD;  Location: [place] CV LAB;  Service: Cardiovascular;  Laterality: N/A;  ? LUMBAR [place] SURGERY  10/25/2014  ? Right L4-L5 removal of seven free fragments of disk mostly posterolaterally.  Lysis of adhesion.  Microscope/notes 10/26/2014  ? LUMBAR LAMINECTOMY/DECOMPRESSION MICRODISCECTOMY Right 08/09/2014  ? Procedure: Right Lumbar Four to Five Microdiskectomy;  Surgeon:  Floyce Stakes, MD;  Location: [facility] NEURO ORS;  Service: Neurosurgery;  Laterality: Right;  Right L4-5 Microdiskectomy  ? ORIF PERIPROSTHETIC FRACTURE  03/31/2012  ? Procedure: OPEN REDUCTION INTERNAL FIXATION (ORIF) PERIPROSTHETIC FRACTURE;  Surgeon: Mauri Pole, MD;  Location: [facility] ORS;  Service: Orthopedics;  Laterality: Left;  Open reduction internal fixation Left distal femur periprosthetic fracture  ? RIGHT/LEFT HEART CATH AND CORONARY ANGIOGRAPHY N/A 05/14/2018  ? Procedure: RIGHT/LEFT HEART CATH AND CORONARY ANGIOGRAPHY;  Surgeon: Larey Dresser, MD;  Location: [place] CV LAB;  Service: Cardiovascular;  Laterality: N/A;  ? SMALL INTESTINE SURGERY  2011  ? "really bad blockage; went into respiratory distress and was on ventilator after surgery; [facility]"  ? SQUAMOUS CELL CARCINOMA EXCISION  ~ 2012  ? S/P "cut off her head then 15 radiation txs"   ? TOTAL KNEE ARTHROPLASTY Bilateral   ? bilateral  ? ? ?Social History: ? reports that she quit smoking about 30 years ago. Her smoking use included cigarettes. She has a 1.00 pack-year smoking history. She has never used smokeless tobacco. She reports that she does not drink alcohol and does not use  drugs. ? ?Allergies  ?Allergen Reactions  ? Sulfa Antibiotics Swelling  ?  Mouth and tongue swelling  ? ? ?History reviewed. No pertinent family history. ? ? ?Prior to Admission medications   ?Medication Sig Start Date End Date Taking? Authorizing Provider  ?ALPRAZolam (XANAX) 0.5 MG tablet Take 0.5 mg by mouth daily as needed for anxiety.  09/14/19   [provider]  ?aspirin EC 81 MG tablet Take 1 tablet (81 mg total) by mouth daily. 05/16/18   Leanor Kail, PA  ?atorvastatin (LIPITOR) 80 MG tablet Take 1 tablet (80 mg total) by mouth daily. 12/06/20   Larey Dresser, MD  ?carvedilol (COREG) 3.125 MG tablet TAKE 1 TABLET BY MOUTH TWICE DAILY WITH A MEAL 09/03/21   Larey Dresser, MD  ?cyclobenzaprine (FLEXERIL) 10 MG tablet Take 10 mg by  mouth in the morning.    [provider]  ?Epoetin Alfa-epbx (RETACRIT IJ) Inject 1 Dose as directed every 21 ( twenty-one) days. ?Patient not taking: Reported on 10/12/2021    Provider, Historical

## 2021-10-23 NOTE — Hospital Course (Addendum)
[name] was admitted to the hospital with the working diagnosis of acute on chronic hypoxemic respiratory failure.  ? ?[name] is a 88 y.o. female with medical history significant for chronic hypoxic respiratory failure on 4 L O2 via [place] at home, HFpEF (EF 55-60%, G1DD by TTE 09/25/2021), severe pulmonary hypertension, interstitial lung disease, CAD s/p prior DES to LAD, CKD stage IIIa, anemia of chronic disease, history of CVA, T2DM, HTN, HLD, depression/anxiety who is admitted with acute on chronic hypoxic respiratory failure due to HFpEF exacerbation.  Requiring BiPAP on admission.  CODE STATUS is DNR/DNI and patient/family request no escalation of care.  Low threshold to move towards comfort care measures if patient deteriorates. ? ?09/24/21 to 09/28/21 Recent hospitalization for respiratory failure, she was treated with diuretics, antibiotics and supplemental 02. She was discharge with palliative care/ hospice at home.  ? ?Reported worsening symptoms for the last 2 weeks with increased dyspnea and worsening hypoxemia despite increase in supplemental 02 per [place]. Decision was made to bring patient back to the hospital for further evaluation.  ? ?Her initial blood pressure was 105/42, HR 86, RR 26, temp 98,5 and 02 saturation 96% on 15 L/min non rebreather mask. Because persistent increased work of breathing she was placed on Bipap. Her lungs had decreased breath sounds bilaterally, heart with S1 and S2 present with no rubs or gallops, abdomen soft and no lower extremity edema.  ? ?ABG 7,28, Pc02 73,3 Pa02 48 and bicarbonate 34.4  ?NA 135, K 4,7, cl 95, bicarbonate 31, glucose 121 bun 35 and cr 1.49  ?BNP 694  ?Wbc 5,2, hgb 8,5 hct 30,3 and plt 211  ?Sars covid 19 negative  ? ?Chest radiograph with bilateral interstitial infiltrates at the lower lobes, more predominant on the right, small pleural effusions.  ? ?EKG 85 bpm, normal axis, right bundle branch block with sinus rhythm, no significant ST segment  changes, negative T wave V 1 to V 5.  ? ? ?

## 2021-10-23 NOTE — Progress Notes (Signed)
Patient transported from ED to [facility] room 11 with no adverse events. ?

## 2021-10-23 NOTE — Assessment & Plan Note (Deleted)
Continue aspirin, Coreg, and statin. ?

## 2021-10-23 NOTE — Progress Notes (Addendum)
? ?  This pt is an active pt with [facility] a home based palliative care program provided by [facility].  ?She was enrolled into our program on 10/16/21. She is receiving services of nursing and SW routinely.  ? ?Home Visit made today due to pt having complaint of oxygen sat in 80's and feet being more swollen. She is a current pt of the [facility] and has completed 3 days of extra lasix dose at home with no improvement this was started on 10/16/21 of 76m and ended on 10/19/21. Typically takes 49mdaily.  ?She was noted to have increased SOB with talking at times on today's visit.  RN facilitated goals of care conversation, reviewing that her supplemental O2 needs have more than doubled in the last week or so. Discussed that this dramatic change from her status when she came home from the hospital points to significant changes in her cardiopulmonary status. Discussed 2 paths, first going to the ED to have test run and see if there is something that they can address to improve her status vs staying home and just focusing on comfort. Discussed what those options would look like.  Her dtrs both say they would support her with whatever choice she wanted. Pt states that she knows that she has to die sometime. Dtrs are tearful during conversation. Discussed hospice as an option if she chose to go the comfort only route however, pt is on several non-formulary meds. After discussion, the pt opted to go to the ED to be evaluated. She asks if she got up there and decided that he wanted to go home and not go back, would that be okay. Dtrs instructed her that it would be. They all decided that they would call ambulance.  ? ?Plan to continue to follow support family and assist with d/c needs. Ch[place]bb [place]N [phone]? ?

## 2021-10-23 NOTE — Assessment & Plan Note (Deleted)
hgb and hct have been stable with at 8,0 and 28,7  ?Continue close follow up.  ?

## 2021-10-23 NOTE — Progress Notes (Signed)
Patient came in by EMS on NRB.  Upon arrival to patient room, patient was noted to have normal respiratory effort, sats were 99%, and patient stated that her breathing was feeling normal.  Took patient off of NR and was placed on [facility] to see how patient would respond.  Patient's sats then dropped to 86%.  Patient was then placed on 12L [place] high-flow nasal cannula and sats improved to 92%.  Patient wears 5L at home.  Will continue to monitor.  ?

## 2021-10-23 NOTE — ED Triage Notes (Signed)
Per EMS called for resp distress. O2 72% on 5L. On nonbreathing 89-90% ? ?Pt reports h/a  ? ?Recent PNA dx ? ?EMS VS ?BP 111/51 ?88 HR  ?40 End [place] ?

## 2021-10-23 NOTE — Assessment & Plan Note (Addendum)
DNR/DNI Goals of care are clear that CODE STATUS is DNR/DNI.  Okay to continue current plan of care with BiPAP, IV diuresis, noninvasive measures.  If patient however deteriorates or exhibit signs of uncontrolled pain/agitation/anxiety then we will have low threshold for transition to comfort care measures. ?

## 2021-10-23 NOTE — Assessment & Plan Note (Deleted)
Continue glucose cover and monitoring with insulin sliding scale.  ?Poor oral intake with fasting glucose this am at 97 mg/dl.  ?

## 2021-10-23 NOTE — ED Provider Notes (Signed)
?[facility] ?Provider Note ? ? ?CSN: [id number] ?Arrival date & time: 10/27/2021  1510 ? ?  ? ?History ? ?Chief Complaint  ?Patient presents with  ? Shortness of Breath  ? ? ?[name] is a 88 y.o. female. ? ? ?Shortness of Breath ?Associated symptoms: no fever   ? ?Patient has a history of anemia, hypertension, pneumonia, pulmonary hypertension, acute on chronic respiratory failure with hypoxia, chronic kidney disease, chronic CHF, interstitial lung disease who presents with increasing oxygen requirements and dyspnea.  History was provided to the patient and her family member.  Patient was last admitted to the hospital on February 20 and was discharged on 24th.  Records from that visit reviewed.  Patient is chronically on supplemental nasal cannula oxygen 4 to 5 L.  At that time patient was noted to have oxygen saturations in the 80s on 5 L of supplemental oxygen.  She had a CT scan in February of this year showing progressive interstitial lung disease but there is also component of community-acquired pneumonia.  Patient was treated with antibiotics and diuresis.  Patient was also started on a course of steroids.  Patient was followed by palliative care. ?Daughter states that once again patient started having increasing oxygen requirements.  Her oxygen saturation does not increase above the 80s.  Palliative care asked the patient if she wanted further evaluation and they opted to come to the emergency room.  Patient is not having any fevers or chills.  No chest pain.  She did have increased leg swelling over the weekend but that resolved with diuresis. ?Home Medications ?Prior to Admission medications   ?Medication Sig Start Date End Date Taking? Authorizing Provider  ?ALPRAZolam (XANAX) 0.5 MG tablet Take 0.5 mg by mouth daily as needed for anxiety.  09/14/19   [provider]  ?aspirin EC 81 MG tablet Take 1 tablet (81 mg total) by mouth daily. 05/16/18   Leanor Kail, PA  ?atorvastatin (LIPITOR) 80 MG tablet Take 1 tablet (80 mg total) by mouth daily. 12/06/20   Larey Dresser, MD  ?carvedilol (COREG) 3.125 MG tablet TAKE 1 TABLET BY MOUTH TWICE DAILY WITH A MEAL 09/03/21   Larey Dresser, MD  ?cyclobenzaprine (FLEXERIL) 10 MG tablet Take 10 mg by mouth in the morning.    [provider]  ?Epoetin Alfa-epbx (RETACRIT IJ) Inject 1 Dose as directed every 21 ( twenty-one) days. ?Patient not taking: Reported on 10/12/2021    [provider]  ?furosemide (LASIX) 40 MG tablet Take 1 tablet (40 mg total) by mouth 2 (two) times daily. 08/24/21   Larey Dresser, MD  ?gabapentin (NEURONTIN) 300 MG capsule Take 300 mg by mouth daily as needed (nerve pain).    [provider]  ?lactose free nutrition (BOOST) LIQD Take 237 mLs by mouth 2 (two) times daily between meals.     [provider]  ?Melatonin 10 MG TABS Take 10 mg by mouth at bedtime as needed (sleep).    [provider]  ?mirtazapine (REMERON) 30 MG tablet Take 30 mg by mouth at bedtime.    [provider]  ?OPSUMIT 10 MG tablet Take 1 tablet (10 mg total) by mouth daily. 10/11/21   Larey Dresser, MD  ?pantoprazole (PROTONIX) 40 MG tablet Take 1 tablet (40 mg total) by mouth daily. 09/29/21   Kerney Elbe, DO  ?Riociguat (ADEMPAS) 2.5 MG TABS Take 2.5 mg by mouth in the morning, at noon,  and at bedtime. 07/24/21   Larey Dresser, MD  ?sodium chloride (OCEAN) 0.65 % SOLN nasal spray Place 1 spray into both nostrils as needed for congestion. 09/28/21   Raiford Noble Latif, DO  ?traMADol-acetaminophen (ULTRACET) 37.5-325 MG tablet Take 2 tablets by mouth 4 (four) times daily as needed for pain. 05/07/18   [provider]  ?Treprostinil Diolamine ER 0.125 MG TBCR Take 1 tablet by mouth in the morning, at noon, and at bedtime.    [provider]  ?Treprostinil Diolamine ER 0.25 MG TBCR Take 0.75 mg by mouth in the morning, at noon, and at  bedtime. Takes three 0.27m and one 0.1280mtablet (0.87542motal) three times daily    [provider]  ?   ? ?Allergies    ?Sulfa antibiotics   ? ?Review of Systems   ?Review of Systems  ?Constitutional:  Negative for fever.  ?Respiratory:  Positive for shortness of breath.   ? ?Physical Exam ?Updated Vital Signs ?BP (!) 117/44   Pulse 90   Temp 98.5 ?F (36.9 ?C) (Oral)   Resp 16   Ht 1.575 m (_0 )   Wt 58.1 kg   SpO2 97%   BMI 23.41 kg/m?  ?Physical Exam ?Vitals and nursing note reviewed.  ?Constitutional:   ?   Comments: Elderly, frail  ?HENT:  ?   Head: Normocephalic and atraumatic.  ?   Right Ear: External ear normal.  ?   Left Ear: External ear normal.  ?Eyes:  ?   General: No scleral icterus.    ?   Right eye: No discharge.     ?   Left eye: No discharge.  ?   Conjunctiva/sclera: Conjunctivae normal.  ?Neck:  ?   Trachea: No tracheal deviation.  ?Cardiovascular:  ?   Rate and Rhythm: Normal rate and regular rhythm.  ?Pulmonary:  ?   Effort: Pulmonary effort is normal. No respiratory distress.  ?   Breath sounds: Normal breath sounds. No stridor. No wheezing or rales.  ?   Comments: Oxygen saturation in the high 80s on supplemental nasal cannula oxygen ?Abdominal:  ?   General: Bowel sounds are normal. There is no distension.  ?   Palpations: Abdomen is soft.  ?   Tenderness: There is no abdominal tenderness. There is no guarding or rebound.  ?Musculoskeletal:     ?   General: No tenderness or deformity.  ?   Cervical back: Neck supple.  ?Skin: ?   General: Skin is warm and dry.  ?   Findings: No rash.  ?Neurological:  ?   General: No focal deficit present.  ?   Mental Status: She is alert.  ?   Cranial Nerves: No cranial nerve deficit (no facial droop, extraocular movements intact, no slurred speech).  ?   Sensory: No sensory deficit.  ?   Motor: No abnormal muscle tone or seizure activity.  ?   Coordination: Coordination normal.  ?Psychiatric:     ?   Mood and Affect: Mood normal.  ? ? ?ED  Results / Procedures / Treatments   ?Labs ?(all labs ordered are listed, but only abnormal results are displayed) ?Labs Reviewed  ?CBC - Abnormal; Notable for the following components:  ?    Result Value  ? RBC 3.18 (*)   ? Hemoglobin 8.5 (*)   ? HCT 30.3 (*)   ? MCHC 28.1 (*)   ? RDW 23.6 (*)   ? All other components within normal  limits  ?BRAIN NATRIURETIC PEPTIDE - Abnormal; Notable for the following components:  ? B Natriuretic Peptide 694.6 (*)   ? All other components within normal limits  ?D-DIMER, QUANTITATIVE - Abnormal; Notable for the following components:  ? D-Dimer, Quant 2.65 (*)   ? All other components within normal limits  ?BASIC METABOLIC PANEL - Abnormal; Notable for the following components:  ? Chloride 95 (*)   ? Glucose, Bld 121 (*)   ? BUN 35 (*)   ? Creatinine, Ser 1.49 (*)   ? Calcium 8.8 (*)   ? GFR, Estimated 34 (*)   ? All other components within normal limits  ?I-STAT ARTERIAL BLOOD GAS, ED - Abnormal; Notable for the following components:  ? pH, Arterial 7.280 (*)   ? pCO2 arterial 73.3 (*)   ? pO2, Arterial 48 (*)   ? Bicarbonate 34.4 (*)   ? TCO2 37 (*)   ? Acid-Base Excess 6.0 (*)   ? HCT 27.0 (*)   ? Hemoglobin 9.2 (*)   ? All other components within normal limits  ?RESP PANEL BY RT-PCR (FLU A&B, COVID) ARPGX2  ?POC OCCULT BLOOD, ED  ? ? ?EKG ?EKG Interpretation ? ?Date/Time:  Tuesday October 23 2021 15:25:22 EDT ?Ventricular Rate:  85 ?PR Interval:  152 ?QRS Duration: 147 ?QT Interval:  384 ?QTC Calculation: 457 ?R Axis:   87 ?Text Interpretation: Sinus rhythm Right bundle branch block ST depression, consider ischemia, diffuse lds No significant change since last tracing Confirmed by Dorie Rank [phone]) on 10/06/2021 4:09:45 PM ? ?Radiology ?CT Angio Chest PE W and/or Wo Contrast ? ?Result Date: 10/18/2021 ?CLINICAL DATA:  Hypoxia, positive D-dimer, evaluate for PE EXAM: CT ANGIOGRAPHY CHEST WITH CONTRAST TECHNIQUE: Multidetector CT imaging of the chest was performed using the standard  protocol during bolus administration of intravenous contrast. Multiplanar CT image reconstructions and MIPs were obtained to evaluate the vascular anatomy. RADIATION DOSE REDUCTION: This exam was performed according

## 2021-10-24 DIAGNOSIS — D631 Anemia in chronic kidney disease: Secondary | ICD-10-CM

## 2021-10-24 DIAGNOSIS — I5033 Acute on chronic diastolic (congestive) heart failure: Secondary | ICD-10-CM | POA: Diagnosis not present

## 2021-10-24 DIAGNOSIS — N179 Acute kidney failure, unspecified: Secondary | ICD-10-CM

## 2021-10-24 DIAGNOSIS — N189 Chronic kidney disease, unspecified: Secondary | ICD-10-CM | POA: Diagnosis not present

## 2021-10-24 DIAGNOSIS — Z8673 Personal history of transient ischemic attack (TIA), and cerebral infarction without residual deficits: Secondary | ICD-10-CM

## 2021-10-24 DIAGNOSIS — J9621 Acute and chronic respiratory failure with hypoxia: Secondary | ICD-10-CM | POA: Diagnosis not present

## 2021-10-24 DIAGNOSIS — N1831 Chronic kidney disease, stage 3a: Secondary | ICD-10-CM

## 2021-10-24 LAB — HEPATIC FUNCTION PANEL
ALT: 14 U/L (ref 0–44)
AST: 21 U/L (ref 15–41)
Albumin: 2.8 g/dL — ABNORMAL LOW (ref 3.5–5.0)
Alkaline Phosphatase: 64 U/L (ref 38–126)
Bilirubin, Direct: <0.1 mg/dL (ref 0.0–0.2)
Total Bilirubin: 0.5 mg/dL (ref 0.3–1.2)
Total Protein: 5.5 g/dL — ABNORMAL LOW (ref 6.5–8.1)

## 2021-10-24 LAB — BASIC METABOLIC PANEL
Anion gap: 11 (ref 5–15)
BUN: 36 mg/dL — ABNORMAL HIGH (ref 8–23)
CO2: 27 mmol/L (ref 22–32)
Calcium: 8.4 mg/dL — ABNORMAL LOW (ref 8.9–10.3)
Chloride: 98 mmol/L (ref 98–111)
Creatinine, Ser: 1.53 mg/dL — ABNORMAL HIGH (ref 0.44–1.00)
GFR, Estimated: 33 mL/min — ABNORMAL LOW (ref >60)
Glucose, Bld: 130 mg/dL — ABNORMAL HIGH (ref 70–99)
Potassium: 5.5 mmol/L — ABNORMAL HIGH (ref 3.5–5.1)
Sodium: 136 mmol/L (ref 135–145)

## 2021-10-24 LAB — CBC
HCT: 28.7 % — ABNORMAL LOW (ref 36.0–46.0)
Hemoglobin: 8 g/dL — ABNORMAL LOW (ref 12.0–15.0)
MCH: 26.2 pg (ref 26.0–34.0)
MCHC: 27.9 g/dL — ABNORMAL LOW (ref 30.0–36.0)
MCV: 94.1 fL (ref 80.0–100.0)
Platelets: 200 10*3/uL (ref 150–400)
RBC: 3.05 MIL/uL — ABNORMAL LOW (ref 3.87–5.11)
RDW: 23.2 % — ABNORMAL HIGH (ref 11.5–15.5)
WBC: 7.6 10*3/uL (ref 4.0–10.5)
nRBC: 0 % (ref 0.0–0.2)

## 2021-10-24 LAB — MAGNESIUM: Magnesium: 1.9 mg/dL (ref 1.7–2.4)

## 2021-10-24 MED ORDER — FUROSEMIDE 10 MG/ML IJ SOLN
60.0000 mg | Freq: Two times a day (BID) | INTRAMUSCULAR | Status: DC
  Administered 2021-10-24 – 2021-10-25 (×2): 60 mg via INTRAVENOUS
  Filled 2021-10-24 (×2): qty 6

## 2021-10-24 MED ORDER — CHLORHEXIDINE GLUCONATE 0.12 % MT SOLN
15.0000 mL | Freq: Two times a day (BID) | OROMUCOSAL | Status: DC
  Administered 2021-10-24 – 2021-10-25 (×4): 15 mL via OROMUCOSAL
  Filled 2021-10-24 (×4): qty 15

## 2021-10-24 MED ORDER — ADULT MULTIVITAMIN W/MINERALS CH
1.0000 | ORAL_TABLET | Freq: Every day | ORAL | Status: DC
  Administered 2021-10-24 – 2021-10-25 (×2): 1 via ORAL
  Filled 2021-10-24 (×2): qty 1

## 2021-10-24 MED ORDER — ORAL CARE MOUTH RINSE
15.0000 mL | Freq: Two times a day (BID) | OROMUCOSAL | Status: DC
  Administered 2021-10-25 (×2): 15 mL via OROMUCOSAL

## 2021-10-24 NOTE — Progress Notes (Signed)
Initial Nutrition Assessment ? ?DOCUMENTATION CODES:  ? ?Not applicable ? ?INTERVENTION:  ? ?Multivitamin w/ minerals daily ?RD to order supplements as diet is advanced.  ? ?NUTRITION DIAGNOSIS:  ? ?Increased nutrient needs related to chronic illness as evidenced by estimated needs. ? ?GOAL:  ? ?Patient will meet greater than or equal to 90% of their needs ? ?MONITOR:  ? ?Diet advancement, Labs, Weight trends ? ?REASON FOR ASSESSMENT:  ? ?Malnutrition Screening Tool ?  ? ?ASSESSMENT:  ? ?88 y.o. female presented to the ED with respiratory distress. PMH includes HTN, CKD IIIa, T2DM, CHF, and CVA. Pt admitted with acute on chronic respiratory failure with hypoxia and hypercapnia.  ? ?Pt sleeping at time of RD visit, did not wake to RD voice or touch. Information was obtained from family at bedside.  ? ?Family reports that pt appetite has been ok at home. Reports a typical intake of: ?Breakfast: oatmeal ?Lunch: sandwich or biscuit  ?Dinner: Daughter brings a meal over for her ?Family reports that pt typically drinks 2 Boost per day, usually the low sugar version.  ? ?Pt family reports that she had some weight loss and got down to 114# not too long ago. More recently pt had gained some weight related to fluid retention. Per EMR, pt has not had any weight loss. Family reports that pt was not using any assistance with ambulating, until recently started to use her walker.  ? ?Discussed with family if it was in pt goals of care to have a temporary feeding tube placed and receive artifical feedings. Family stated no, pt would not want that.  ? ?RD discussed that once diet is advanced, RD will order Boost supplement (chocolate only).  ? ?Family with no other questions or concerns at this time.  ? ?Medications reviewed and include: Lasix ?Labs reviewed: Potassium 5.5, BUN 36, Creatinine 1.53 ?  ?NUTRITION - FOCUSED PHYSICAL EXAM: ? ?Flowsheet Row Most Recent Value  ?Orbital Region Unable to assess  ?Upper Arm Region No  depletion  ?Thoracic and Lumbar Region No depletion  ?Buccal Region Unable to assess  ?Temple Region Mild depletion  ?Clavicle Bone Region Moderate depletion  ?Clavicle and Acromion Bone Region Moderate depletion  ?Scapular Bone Region Moderate depletion  ?Dorsal Hand Mild depletion  ?Patellar Region Mild depletion  ?Anterior Thigh Region Mild depletion  ?Posterior Calf Region Mild depletion  ?Edema (RD Assessment) None  ?Hair Reviewed  ?Eyes Unable to assess  ?Mouth Unable to assess  ?Skin Reviewed  ?Nails Reviewed  ? ?Diet Order:   ?Diet Order   ? ?       ?  Diet NPO time specified Except for: Sips with Meds  Diet effective now       ?  ? ?  ?  ? ?  ? ?EDUCATION NEEDS:  ? ?Not appropriate for education at this time ? ?Skin:  Skin Assessment: Reviewed RN Assessment ? ?Last BM:  No Documentation ? ?Height:  ?Ht Readings from Last 1 Encounters:  ?10/12/2021 _0  (1.575 m)  ? ?Weight:  ?Wt Readings from Last 1 Encounters:  ?10/24/21 57.6 kg  ? ?Ideal Body Weight:  50 kg ? ?BMI:  Body mass index is 23.23 kg/m?. ? ?Estimated Nutritional Needs:  ?Kcal:  1700-1900 ?Protein:  85-100 grams ?Fluid:  >/= 1.7 L ? ? ? ?Hermina Barters RD, LDN ?Clinical Dietitian ?See AMiON for contact information.  ? ?

## 2021-10-24 NOTE — Progress Notes (Signed)
Pharmacy Consult for Pulmonary Hypertension Treatment  ? ?Indication - Continuation of prior to admission medication  ? ?Patient is 88 y.o.  with history of PAH on chronic Macitentan (Opsumit) PTA and will be continued while hospitalized.  ? ?Continuing this medication order as an inpatient requires that monitoring parameters per REMS requirements must be met. ? ?Chronic therapy is under the supervision of Dr. Loralie Champagne who is enrolled in the REMS program and is being notified of continuation of therapy. A staff message in EPIC has been sent notifying the certified prescriber.  ?Per patient report has previously been educated on Pregnancy Risk and Hepatotoxicity . On admission pregnancy risk has been assessed and no monitoring required. ?Hepatic function has been evaluated. AST/ALT appropriate to continue medication at this time. ? ? ?  Latest Ref Rng & Units 10/24/2021  ?  3:42 AM 09/24/2021  ?  5:43 PM 09/24/2021  ?  1:44 PM  ?Hepatic Function  ?Total Protein 6.5 - 8.1 g/dL 5.5   6.2   5.8    ?Albumin 3.5 - 5.0 g/dL 2.8   2.6   2.6    ?AST 15 - 41 U/L _0 ?ALT 0 - 44 U/L _1 ?Alk Phosphatase 38 - 126 U/L 64   47   46    ?Total Bilirubin 0.3 - 1.2 mg/dL 0.5   0.2   0.2    ?Bilirubin, Direct 0.0 - 0.2 mg/dL <0.1   <0.1     ? ? ?If any question arise or pregnancy is identified during hospitalization, contact for bosentan and macitentan: [phone]; ambrisentan: [phone]. ? ?Thank for you allowing [place] to participate in the care of this patient. ? ? ?Arturo Morton, PharmD, BCPS ?Please check AMION for all [place] contact numbers ?Clinical Pharmacist ?10/24/2021 10:58 AM ? ?

## 2021-10-24 NOTE — TOC Initial Note (Signed)
Transition of Care (TOC) - Initial/Assessment Note  ? ? ?Patient Details  ?Name: [name] ?MRN: [id number] ?Date of Birth: 06/28/1932 ? ?Transition of Care (TOC) CM/SW Contact:    ?Zenon Mayo, RN ?Phone Number: ?10/24/2021, 4:43 PM ? ?Clinical Narrative:                 ?NCM spoke with daughter at the bedside, Georgina Quint, she states patient has [facility] outpatient palliative services with [facility] and they would like to continue with them.  NCM confirmed with Cheri. Cheri states patient has home oxygen concentrator which goes up to 5 liters only. She has been on bipap here in the hospital and if need a bipap at home that will have to be ordered. Per Daughter , [name] she will transport patient home by car.  She states she is active with [facility] for [facility], and would also like to continue with that as well.  NCM confirmed with Corene Cornea with [facility].  Will need HHR order to resume. Patient is with ES Resp failure, not tolerating bipap, on HFNC 30%. TOC will continue to follow for dc needs.  ? ?Expected Discharge Plan: [place] ?Barriers to Discharge: Continued Medical Work up ? ? ?Patient Goals and CMS Choice ?Patient states their goals for this hospitalization and ongoing recovery are:: return home with outpatient pall services ?CMS Medicare.gov Compare Post Acute Care list provided to:: Patient Represenative (must comment) ?Choice offered to / list presented to : Adult Children ? ?Expected Discharge Plan and Services ?Expected Discharge Plan: [place] ?  ?Discharge Planning Services: CM Consult ?Post Acute Care Choice: Resumption of Svcs/PTA Provider, Home Health ?Living arrangements for the past 2 months: [place] ?                ?  ?  ?  ?  ?  ?HH Arranged: RN ?[place] Agency: [facility]ologist ([place]) ?Date HH Agency Contacted: 10/24/21 ?Time [place]: 0211 ?Representative spoke with at [place]: Corene Cornea ? ?Prior Living  Arrangements/Services ?Living arrangements for the past 2 months: [place] ?Lives with:: Self ?Patient language and need for interpreter reviewed:: Yes ?Do you feel safe going back to the place where you live?: Yes      ?Need for Family Participation in Patient Care: Yes (Comment) ?Care giver support system in place?: Yes (comment) ?Current home services: Home RN (rolling walker, cane, shower chair, oxygen ([facility])  has HHRN with [facility]) ?Criminal Activity/Legal Involvement Pertinent to Current Situation/Hospitalization: No - Comment as needed ? ?Activities of Daily Living ?Home Assistive Devices/Equipment: Oxygen, Cane (specify quad or straight) ?ADL Screening (condition at time of admission) ?Patient's cognitive ability adequate to safely complete daily activities?: Yes ?Is the patient deaf or have difficulty hearing?: Yes ?Does the patient have difficulty seeing, even when wearing glasses/contacts?: No ?Does the patient have difficulty concentrating, remembering, or making decisions?: No ?Patient able to express need for assistance with ADLs?: Yes ?Does the patient have difficulty dressing or bathing?: No ?Independently performs ADLs?: Yes (appropriate for developmental age) ?Does the patient have difficulty walking or climbing stairs?: Yes ?Weakness of Legs: Both ?Weakness of Arms/Hands: None ? ?Permission Sought/Granted ?  ?  ?   ?   ?   ?   ? ?Emotional Assessment ?Appearance:: Appears stated age ?Attitude/Demeanor/Rapport: Unable to Assess ?Affect (typically observed): Unable to Assess ?  ?Alcohol / Substance Use: Not Applicable ?Psych Involvement:  No (comment) ? ?Admission diagnosis:  Hypercarbia [R06.89] ?Hypoxia [R09.02] ?Chronic lung disease [J98.4] ?Acute on chronic respiratory failure with hypoxia and hypercapnia (HCC) [J96.21, J96.22] ?Acute on chronic congestive heart failure, unspecified heart failure type ([place]) [I50.9] ?Patient Active Problem List  ? Diagnosis Date Noted  ? Acute  on chronic respiratory failure with hypoxia and hypercapnia ([place]) 10/13/2021  ? Goals of care, counseling/discussion 10/11/2021  ? Acute on chronic heart failure with preserved ejection fraction (HFpEF) ([place]) 09/26/2021  ? Acute on chronic respiratory failure with hypoxia ([place]) 09/24/2021  ? Acute renal failure superimposed on stage 3a chronic kidney disease ([place]) 09/24/2021  ? Chronic diastolic CHF (congestive heart failure) ([place]) 09/24/2021  ? ILD (interstitial lung disease) ([place]) 09/24/2021  ? Type 2 diabetes mellitus with chronic kidney disease, without long-term current use of insulin ([place]) 09/24/2021  ? Anemia of chronic kidney failure 05/31/2021  ? Hiatal hernia   ? Dysphagia 06/23/2020  ? Chest pain 10/06/2018  ? Febrile illness   ? 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 CAD/HLD   ? Exertional dyspnea 05/14/2018  ? Abnormal CT scan, gastrointestinal tract 12/31/2016  ? GI bleed 12/31/2016  ? HLD (hyperlipidemia) 12/09/2016  ? Anxiety and depression 12/09/2016  ? History of CVA (cerebrovascular accident) 12/09/2016  ? Back pain 12/09/2016  ? History of Lumbar herniated disc 08/09/2014  ? pneumonia  04/04/2012  ? Acute blood loss anemia 04/01/2012  ? Fall at home 03/31/2012  ? Anemia in chronic kidney disease 03/31/2012  ? Femur fracture, left ([place]) 03/31/2012  ? Hypertension 03/30/2012  ? ?PCP:  [name], PA-C ?Pharmacy:   ?[facility] [address] [place], [place] ?[place] ?[place] [address] ?Phone: [phone] Fax: [phone] ? ?[place], [place] ?[place] [address] ?Phone: [phone] Fax: [phone] ? ?[facility] [place], [place] - [address] ?[address] ?[place] [address] ?Phone: [phone] Fax: [phone] ? ?[place], [place] ?[place] ?[place] [address] ?Phone: [phone] Fax: [phone] ? ? ? ? ?Social Determinants of Health (SDOH) Interventions ?  ? ?Readmission Risk Interventions ? ?  10/24/2021  ?  4:36 PM  ?Readmission Risk Prevention Plan  ?Transportation Screening Complete  ?PCP or Specialist Appt within 3-5 Days Complete  ?[place] or Home Care Consult Complete  ?Social Work Consult for [place] Planning/Counseling Complete  ?Palliative Care Screening Complete  ?Medication Review Press photographer) Complete  ? ? ? ?

## 2021-10-24 NOTE — Progress Notes (Signed)
Patient asking to come off BIPAP. Placed patient on [place] at 25L and 100%. NRB mask on standby if needed and bipap still in room. Patient tolerating well at this time and maintaining her O2 sats. ?

## 2021-10-24 NOTE — Assessment & Plan Note (Deleted)
Clinically her volume has improved.  ? ?Documented urine output is 700 cc.  ?Systolic blood pressure 444 to 130 mmHg.  ? ?Will hold on furosemide for now and will continue blood pressure monitoring.  ?

## 2021-10-24 NOTE — Progress Notes (Signed)
Bladder scan 470m. Verbal order for in and out by MD.  ?Ordered and waiting for in and out foley. ?

## 2021-10-24 NOTE — Progress Notes (Signed)
?Progress Note ? ? ?[name]: [name]:[id number] DOB: 1931-10-07 DOA: 10/12/2021     1 ?DOS: the [name] was seen and examined on 10/24/2021 ?  ?Brief hospital course: ?[name] was admitted to the hospital with the working diagnosis of acute on chronic hypoxemic respiratory failure.  ? ?[name] is a 88 y.o. female with medical history significant for chronic hypoxic respiratory failure on 4 L O2 via [place] at home, HFpEF (EF 55-60%, G1DD by TTE 09/25/2021), severe pulmonary hypertension, interstitial lung disease, CAD s/p prior DES to LAD, CKD stage IIIa, anemia of chronic disease, history of CVA, T2DM, HTN, HLD, depression/anxiety who is admitted with acute on chronic hypoxic respiratory failure due to HFpEF exacerbation.  Requiring BiPAP on admission.  CODE STATUS is DNR/DNI and [name]/family request no escalation of care.  Low threshold to move towards comfort care measures if [name] deteriorates. ? ?09/24/21 to 09/28/21 Recent hospitalization for respiratory failure, she was treated with diuretics, antibiotics and supplemental 02. She was discharge with palliative care/ hospice at home.  ? ?Reported worsening symptoms for the last 2 weeks with increased dyspnea and worsening hypoxemia despite increase in supplemental 02 per [place]. Decision was made to bring [name] back to the hospital for further evaluation.  ? ?Her initial blood pressure was 105/42, HR 86, RR 26, temp 98,5 and 02 saturation 96% on 15 L/min non rebreather mask. Because persistent increased work of breathing she was placed on Bipap. Her lungs had decreased breath sounds bilaterally, heart with S1 and S2 present with no rubs or gallops, abdomen soft and no lower extremity edema.  ? ?ABG 7,28, Pc02 73,3 Pa02 48 and bicarbonate 34.4  ?NA 135, K 4,7, cl 95, bicarbonate 31, glucose 121 bun 35 and cr 1.49  ?BNP 694  ?Wbc 5,2, hgb 8,5 hct 30,3 and plt 211  ?Sars covid 19 negative  ? ?Chest radiograph with bilateral interstitial infiltrates  at the lower lobes, more predominant on the right, small pleural effusions.  ? ?EKG 85 bpm, normal axis, right bundle branch block with sinus rhythm, no significant ST segment changes, negative T wave V 1 to V 5.  ? ? ? ?Assessment and Plan: ?* Acute on chronic respiratory failure with hypoxia and hypercapnia (HCC) ?Multifactorial hypoxemic respiratory failure. ? ?Her oxygenation this am is 95% on HFNC 30 L/min and 100% Fi02. ? ?[name] has pulmonary hypertension and right heart failure, possible ILD.  ?Continue supplemental 02 per HFNC to target 02 saturation 88% or greater.  ? ?Acute on chronic heart failure with preserved ejection fraction (HFpEF) ([place]) ?[name] with positive pitting edema at her lower extremities, positive JVD.  ? ?Plan to continue aggressive diuresis as tolerated. Increase furosemide to 60 mg IV q12 hrs ?Continue close blood pressure monitoring.  ? ?ILD (interstitial lung disease) ([place]) ?Severe pulmonary artery hypertension ?Continue oxymetry monitoring, diuresis and supplemental 02. ?Plan to repeat CT chest when improvement in volume status if [name] continue with severe hypoxemia.  ?Hold on systemic steroids for now.  ? ?Acute renal failure superimposed on stage 3a chronic kidney disease ([place]) ?Hyperkalemia.  ?[name] continue with volume overload.  ?Plan to continue diuresis with furosemide 60 mg Iv q12 hrs ?Her renal function today had a serum cr of 1.53 with K at 5,5 and serum bicarbonate at 27  ? ?Follow up renal function in am.  ? ?Anemia in chronic kidney disease ?Continue close monitoring of hgb and hct.  ?No indication for prbc transfusion.  ? ?History of CVA (  cerebrovascular accident) ?Continue aspirin and atorvastatin. ? ?Goals of care, counseling/discussion ?DNR/DNI Goals of care are clear that CODE STATUS is DNR/DNI.  Okay to continue current plan of care with BiPAP, IV diuresis, noninvasive measures.  If [name] however deteriorates or exhibit signs of uncontrolled  pain/agitation/anxiety then we will have low threshold for transition to comfort care measures. ? ?CAD/HLD ?Continue aspirin, Coreg, and statin. ? ?Type 2 diabetes mellitus with chronic kidney disease, without long-term current use of insulin ([place]) ?Continue glucose cover and monitoring with insulin sliding scale.  ?Fasting glucose this am is 130. ? ?Anxiety and depression ?Continue home Xanax and Remeron. ? ? ? ? ?  ? ?Subjective: [name] continue to have dyspnea, very weak and deconditioned.  ? ?Physical Exam: ?Vitals:  ? 10/24/21 0500 10/24/21 0731 10/24/21 1250 10/24/21 1256  ?BP:  (!) 112/45 (!) 106/42   ?Pulse: 92 93 83   ?Resp: _0 ?Temp:  97.7 ?F (36.5 ?C) 97.8 ?F (36.6 ?C)   ?TempSrc:  Axillary Axillary   ?SpO2: 93% 95% 100% 100%  ?Weight:      ?Height:      ? ?Neurology awake and alert, deconditioned and ill looking appearing  ?ENT with positive pallor ?Cardiovascular heart with S1 and S2 present and rhythmic with no gallops or murmurs ?Positive JVD ?Positive lower extremity edema ++ pitting ?Abdomen soft ant non tender ?Data Reviewed: ? ? ? ?Family Communication: I spoke with [name]'s daughter at the bedside, we talked in detail about [name]'s condition, plan of care and prognosis and all questions were addressed. ? ? ?Disposition: ?Status is: Inpatient ?Remains inpatient appropriate because: respiratory failure  ? Planned Discharge Destination: Home ? ?Author: ?Tawni Millers, MD ?10/24/2021 4:01 PM ? ?For on call review [email or web address].  ?

## 2021-10-24 NOTE — Progress Notes (Signed)
RT NOTES: Found patient on bipap. Removed from bipap and placed on [facility] for patient comfort. Sats 100%.  ?

## 2021-10-25 ENCOUNTER — Telehealth: Payer: Self-pay | Admitting: Internal Medicine

## 2021-10-25 ENCOUNTER — Telehealth (HOSPITAL_COMMUNITY): Payer: Self-pay

## 2021-10-25 DIAGNOSIS — J849 Interstitial pulmonary disease, unspecified: Secondary | ICD-10-CM

## 2021-10-25 DIAGNOSIS — J9621 Acute and chronic respiratory failure with hypoxia: Secondary | ICD-10-CM | POA: Diagnosis not present

## 2021-10-25 DIAGNOSIS — I5033 Acute on chronic diastolic (congestive) heart failure: Secondary | ICD-10-CM | POA: Diagnosis not present

## 2021-10-25 DIAGNOSIS — I25119 Atherosclerotic heart disease of native coronary artery with unspecified angina pectoris: Secondary | ICD-10-CM

## 2021-10-25 DIAGNOSIS — N179 Acute kidney failure, unspecified: Secondary | ICD-10-CM | POA: Diagnosis not present

## 2021-10-25 LAB — BASIC METABOLIC PANEL
Anion gap: 9 (ref 5–15)
BUN: 44 mg/dL — ABNORMAL HIGH (ref 8–23)
CO2: 30 mmol/L (ref 22–32)
Calcium: 8.4 mg/dL — ABNORMAL LOW (ref 8.9–10.3)
Chloride: 99 mmol/L (ref 98–111)
Creatinine, Ser: 2.37 mg/dL — ABNORMAL HIGH (ref 0.44–1.00)
GFR, Estimated: 19 mL/min — ABNORMAL LOW (ref >60)
Glucose, Bld: 97 mg/dL (ref 70–99)
Potassium: 3.9 mmol/L (ref 3.5–5.1)
Sodium: 138 mmol/L (ref 135–145)

## 2021-10-25 LAB — PROCALCITONIN: Procalcitonin: <0.1 ng/mL

## 2021-10-25 MED ORDER — MOMETASONE FURO-FORMOTEROL FUM 200-5 MCG/ACT IN AERO
2.0000 | INHALATION_SPRAY | Freq: Two times a day (BID) | RESPIRATORY_TRACT | Status: DC
Start: 2021-10-25 — End: 2021-10-26
  Administered 2021-10-25: 2 via RESPIRATORY_TRACT
  Filled 2021-10-25: qty 8.8

## 2021-10-25 MED ORDER — MOMETASONE FURO-FORMOTEROL FUM 200-5 MCG/ACT IN AERO
2.0000 | INHALATION_SPRAY | Freq: Two times a day (BID) | RESPIRATORY_TRACT | Status: DC
  Filled 2021-10-25: qty 8.8

## 2021-10-25 MED ORDER — MOMETASONE FURO-FORMOTEROL FUM 200-5 MCG/ACT IN AERO
2.0000 | INHALATION_SPRAY | Freq: Two times a day (BID) | RESPIRATORY_TRACT | Status: DC
Start: 2021-10-26 — End: 2021-10-25
  Filled 2021-10-25: qty 8.8

## 2021-10-25 MED ORDER — ALPRAZOLAM 0.5 MG PO TABS
0.5000 mg | ORAL_TABLET | Freq: Three times a day (TID) | ORAL | Status: DC | PRN
  Administered 2021-10-25 (×2): 0.5 mg via ORAL
  Filled 2021-10-25 (×2): qty 1

## 2021-10-25 MED ORDER — IPRATROPIUM-ALBUTEROL 0.5-2.5 (3) MG/3ML IN SOLN
3.0000 mL | Freq: Four times a day (QID) | RESPIRATORY_TRACT | Status: DC
  Administered 2021-10-25 – 2021-10-26 (×3): 3 mL via RESPIRATORY_TRACT
  Filled 2021-10-25 (×4): qty 3

## 2021-10-25 MED ORDER — IPRATROPIUM-ALBUTEROL 0.5-2.5 (3) MG/3ML IN SOLN: 3.0000 mL | RESPIRATORY_TRACT | Status: DC | PRN

## 2021-10-25 NOTE — Telephone Encounter (Signed)
Family called wanting to inform Dr.McLean that Ms. [name] is in hospital. Called daughter [name] and left message to let her know that he has been told ?

## 2021-10-25 NOTE — Progress Notes (Signed)
?Progress Note ? ? ?Patient: [name] ZOX:[id number] DOB: 17-Jan-1932 DOA: 10/24/2021     2 ?DOS: the patient was seen and examined on 10/25/2021 ?  ?Brief hospital course: ?[name] was admitted to the hospital with the working diagnosis of acute on chronic hypoxemic respiratory failure.  ? ?[name] is a 88 y.o. female with medical history significant for chronic hypoxic respiratory failure on 4 L O2 via [place] at home, HFpEF (EF 55-60%, G1DD by TTE 09/25/2021), severe pulmonary hypertension, interstitial lung disease, CAD s/p prior DES to LAD, CKD stage IIIa, anemia of chronic disease, history of CVA, T2DM, HTN, HLD, depression/anxiety who is admitted with acute on chronic hypoxic respiratory failure due to HFpEF exacerbation.  Requiring BiPAP on admission.  CODE STATUS is DNR/DNI and patient/family request no escalation of care.  Low threshold to move towards comfort care measures if patient deteriorates. ? ?09/24/21 to 09/28/21 Recent hospitalization for respiratory failure, she was treated with diuretics, antibiotics and supplemental 02. She was discharge with palliative care/ hospice at home.  ? ?Reported worsening symptoms for the last 2 weeks with increased dyspnea and worsening hypoxemia despite increase in supplemental 02 per [place]. Decision was made to bring patient back to the hospital for further evaluation.  ? ?Her initial blood pressure was 105/42, HR 86, RR 26, temp 98,5 and 02 saturation 96% on 15 L/min non rebreather mask. Because persistent increased work of breathing she was placed on Bipap. Her lungs had decreased breath sounds bilaterally, heart with S1 and S2 present with no rubs or gallops, abdomen soft and no lower extremity edema.  ? ?ABG 7,28, Pc02 73,3 Pa02 48 and bicarbonate 34.4  ?NA 135, K 4,7, cl 95, bicarbonate 31, glucose 121 bun 35 and cr 1.49  ?BNP 694  ?Wbc 5,2, hgb 8,5 hct 30,3 and plt 211  ?Sars covid 19 negative  ? ?Chest radiograph with bilateral interstitial infiltrates  at the lower lobes, more predominant on the right, small pleural effusions.  ? ?EKG 85 bpm, normal axis, right bundle branch block with sinus rhythm, no significant ST segment changes, negative T wave V 1 to V 5.  ? ? ? ?Assessment and Plan: ?* Acute on chronic respiratory failure with hypoxia and hypercapnia (HCC) ?Multifactorial hypoxemic respiratory failure. ? ?Her oxygenation this am is 92% on HFNC 30 L/min and 100% Fi02. ? ?Patient has pulmonary hypertension and right heart failure, possible ILD, bronchiectasis.  ?Continue supplemental 02 per HFNC to target 02 saturation 88% or greater.  ? ?Acute on chronic heart failure with preserved ejection fraction (HFpEF) ([place]) ?Clinically her volume has improved.  ? ?Documented urine output is 700 cc.  ?Systolic blood pressure 811 to 130 mmHg.  ? ?Will hold on furosemide for now and will continue blood pressure monitoring.  ? ?ILD (interstitial lung disease) ([place]) ?Severe pulmonary artery hypertension.  ? ?Patient has been treated for pneumonia on last hospitalization.  ?She has a right lower lobe infiltrate and positive bronchiectasis.  ?Continue with high oxygen requirements.  ? ?Plan to check procalcitonin ?Aggressive airway clearing techniques with chest PT, flutter valve and incentive spirometer. ?Out of bed to chair tid with meals. ?Add scheduled bronchodilator therapy and inhaled corticosteroids.  ? ? ?Acute renal failure superimposed on stage 3a chronic kidney disease ([place]) ?Hyperkalemia.  ?Worsening renal function with serum cr at 2.37 with K at 3,9 and serum bicarbonate at 30. ?Plan to hold on furosemide for now and continue close monitoring on renal function and electrolytes.  ? ?  Patient with poor oral intake, consult nutrition for recommendations.  ? ?Anemia in chronic kidney disease ?hgb and hct have been stable with at 8,0 and 28,7  ?Continue close follow up.  ? ?History of CVA (cerebrovascular accident) ?Continue aspirin and atorvastatin. ? ?Goals of care,  counseling/discussion ?DNR/DNI Goals of care are clear that CODE STATUS is DNR/DNI.  Okay to continue current plan of care with BiPAP, IV diuresis, noninvasive measures.  If patient however deteriorates or exhibit signs of uncontrolled pain/agitation/anxiety then we will have low threshold for transition to comfort care measures. ? ?CAD/HLD ?Continue aspirin, Coreg, and statin. ? ?Type 2 diabetes mellitus with chronic kidney disease, without long-term current use of insulin ([place]) ?Continue glucose cover and monitoring with insulin sliding scale.  ?Poor oral intake with fasting glucose this am at 97 mg/dl.  ? ?Anxiety and depression ?Continue home alprazolam and Remeron. ? ? ? ? ?  ? ?Subjective: patient is feeling better today but continue with significant oxygen requirements.  ? ?Physical Exam: ?Vitals:  ? 10/25/21 0220 10/25/21 0420 10/25/21 0752 10/25/21 1115  ?BP:  121/60 (!) 120/44 (!) 132/59  ?Pulse: 92 85 88 95  ?Resp: _0 ?Temp:  98.4 ?F (36.9 ?C) 97.6 ?F (36.4 ?C) 98 ?F (36.7 ?C)  ?TempSrc:  Oral Oral   ?SpO2: 97% 93% 98% 92%  ?Weight:  56.2 kg    ?Height:      ? ?Neurology awake and alert (decreased hearing) ?ENT with mild pallor ?Cardiovascular with S1 and S2 present and rhythmic with no gallops or murmurs.  ?No JVD ?No lower extremity edema.  ?Respiratory with rales at bases and decreased breath sounds at the right base.  ?Abdomen soft and non tender ?Data Reviewed: ? ? ? ?Family Communication: I spoke with patient's daughter at the bedside, we talked in detail about patient's condition, plan of care and prognosis and all questions were addressed. ? ? ?Disposition: ?Status is: Inpatient ?Remains inpatient appropriate because: respiratory failure  ? Planned Discharge Destination: Home ? ? ? ? ? ?Author: ?Tawni Millers, MD ?10/25/2021 12:30 PM ? ?For on call review [email or web address]  ?

## 2021-10-25 NOTE — Telephone Encounter (Signed)
Called patient's daughter but she did not answer. Left message for her to call back.  ? ?I reviewed her chart and she is still admitted.  ? ?Will route to MR so he is aware.  ?

## 2021-10-25 NOTE — Telephone Encounter (Signed)
Calling pulmonary consult is a conversation that they had to have with the primary hospitalist team there.  If they want pulmonary consult they are to asked the hospitalist call for pulmonary consult.  Please advise them.  Also please tell them nevertheless that I have sent a message out to the hospitalist to see what is going on.  I will probably only hear from them only tomorrow ?

## 2021-10-25 NOTE — Progress Notes (Incomplete)
Nutrition Follow-up ? ?DOCUMENTATION CODES:  ? ?Not applicable ? ?INTERVENTION:  ? ?Continue Multivitamin w/ minerals daily ?Boost Plus po BID, provides 360 kcal and 14 grams of protein each ? ?NUTRITION DIAGNOSIS:  ? ?Increased nutrient needs related to chronic illness as evidenced by estimated needs. - Ongoing ? ?GOAL:  ? ?Patient will meet greater than or equal to 90% of their needs - Progressing  ? ?MONITOR:  ? ?Diet advancement, Labs, Weight trends ? ?REASON FOR ASSESSMENT:  ? ?Consult ?Assessment of nutrition requirement/status ? ?ASSESSMENT:  ? ?88 y.o. female presented to the ED with respiratory distress. PMH includes HTN, CKD IIIa, T2DM, CHF, and CVA. Pt admitted with acute on chronic respiratory failure with hypoxia and hypercapnia.  ? ?3/22 - diet advanced to Regular with 1200 mL fluid restriction ? ?RD received a consult for assessment of nutrition requirement/status. ? ?Medications reviewed and include: Remeron, MVI ?Labs reviewed: BUN 44, Creatinine 2.37 ?  ?Diet Order:   ?Diet Order   ? ?       ?  Diet regular Room service appropriate? Yes; Fluid consistency: Thin; Fluid restriction: 1200 mL Fluid  Diet effective now       ?  ? ?  ?  ? ?  ? ?EDUCATION NEEDS:  ? ?Not appropriate for education at this time ? ?Skin:  Skin Assessment: Reviewed RN Assessment ? ?Last BM:  3/23 ? ?Height:  ?Ht Readings from Last 1 Encounters:  ?10/30/2021 _0  (1.575 m)  ? ?Weight:  ?Wt Readings from Last 1 Encounters:  ?10/25/21 56.2 kg  ? ?Ideal Body Weight:  50 kg ? ?BMI:  Body mass index is 22.66 kg/m?. ? ?Estimated Nutritional Needs:  ?Kcal:  1700-1900 ?Protein:  85-100 grams ?Fluid:  >/= 1.7 L ? ? ? ?Hermina Barters RD, LDN ?Clinical Dietitian ?See AMiON for contact information.  ? ?

## 2021-10-26 LAB — BASIC METABOLIC PANEL
Anion gap: 14 (ref 5–15)
BUN: 53 mg/dL — ABNORMAL HIGH (ref 8–23)
CO2: 23 mmol/L (ref 22–32)
Calcium: 8.4 mg/dL — ABNORMAL LOW (ref 8.9–10.3)
Chloride: 98 mmol/L (ref 98–111)
Creatinine, Ser: 3.25 mg/dL — ABNORMAL HIGH (ref 0.44–1.00)
GFR, Estimated: 13 mL/min — ABNORMAL LOW (ref >60)
Glucose, Bld: 141 mg/dL — ABNORMAL HIGH (ref 70–99)
Potassium: 4.7 mmol/L (ref 3.5–5.1)
Sodium: 135 mmol/L (ref 135–145)

## 2021-10-30 ENCOUNTER — Encounter (HOSPITAL_COMMUNITY): Payer: Medicare Other | Admitting: Cardiology

## 2021-10-30 ENCOUNTER — Ambulatory Visit: Payer: Medicare Other | Admitting: Internal Medicine

## 2021-11-01 ENCOUNTER — Other Ambulatory Visit: Payer: Medicare Other

## 2021-11-01 ENCOUNTER — Ambulatory Visit: Payer: Medicare Other

## 2021-11-01 ENCOUNTER — Ambulatory Visit: Payer: Medicare Other | Admitting: Oncology

## 2021-11-03 NOTE — Telephone Encounter (Signed)
Spoke with daughter [name] per [place]. Daughter stated that pt had passed this morning. Offered our condolences and any assistance may need from [place].  ? ?Routing to Dr. Philis Kendall as Juluis Rainier  ?

## 2021-11-03 NOTE — Death Summary Note (Signed)
? ?DEATH SUMMARY  ? ?Patient Details  ?Name: [name] ?MRN: [id number] ?DOB: March 12, 1932 ?GBT:DVVOHY, Ovid Curd, PA-C ?Admission/Discharge Information  ? ?Admit Date:  15-Nov-2021  ?Date of Death: Date of Death: 11/18/21  ?Time of Death: Time of Death: 0530  ?Length of Stay: 3  ? ?Principle Cause of death: Pulmonary hypertension  ? ?Hospital Diagnoses: ?Principal Problem: ?  Acute on chronic respiratory failure with hypoxia and hypercapnia (HCC) ?Active Problems: ?  Acute on chronic heart failure with preserved ejection fraction (HFpEF) ([place]) ?  ILD (interstitial lung disease) ([place]) ?  Anemia in chronic kidney disease ?  Acute renal failure superimposed on stage 3a chronic kidney disease ([place]) ?  History of CVA (cerebrovascular accident) ?  CAD/HLD ?  Type 2 diabetes mellitus with chronic kidney disease, without long-term current use of insulin ([place]) ?  Anxiety and depression ?  Goals of care, counseling/discussion ? ? ?Hospital Course: ?Mrs. Cuervo was admitted to the hospital with the working diagnosis of acute on chronic hypoxemic respiratory failure.  ? ?[name] is a 88 y.o. female with medical history significant for chronic hypoxic respiratory failure on 4 L O2 via [place] at home, HFpEF (EF 55-60%, G1DD by TTE 09/25/2021), severe pulmonary hypertension, interstitial lung disease, CAD s/p prior DES to LAD, CKD stage IIIa, anemia of chronic disease, history of CVA, T2DM, HTN, HLD, depression/anxiety who is admitted with acute on chronic hypoxic respiratory failure due to HFpEF exacerbation.  Requiring BiPAP on admission.  CODE STATUS is DNR/DNI and patient/family request no escalation of care.  Low threshold to move towards comfort care measures if patient deteriorates. ? ?09/24/21 to 09/28/21 Recent hospitalization for respiratory failure, she was treated with diuretics, antibiotics and supplemental 02. She was discharge with palliative care/ hospice at home.  ? ?Reported worsening symptoms for the last 2  weeks with increased dyspnea and worsening hypoxemia despite increase in supplemental 02 per . Decision was made to bring patient back to the hospital for further evaluation.  ? ?Her initial blood pressure was 105/42, HR 86, RR 26, temp 98,5 and 02 saturation 96% on 15 L/min non rebreather mask. Because persistent increased work of breathing she was placed on Bipap. Her lungs had decreased breath sounds bilaterally, heart with S1 and S2 present with no rubs or gallops, abdomen soft and no lower extremity edema.  ? ?ABG 7,28, Pc02 73,3 Pa02 48 and bicarbonate 34.4  ?NA 135, K 4,7, cl 95, bicarbonate 31, glucose 121 bun 35 and cr 1.49  ?BNP 694  ?Wbc 5,2, hgb 8,5 hct 30,3 and plt 211  ?Sars covid 19 negative  ? ?Chest radiograph with bilateral interstitial infiltrates at the lower lobes, more predominant on the right, small pleural effusions.  ? ?EKG 85 bpm, normal axis, right bundle branch block with sinus rhythm, no significant ST segment changes, negative T wave V 1 to V 5.  ? ? ?Patient was admitted to the cardiac ward and placed on aggressive medical therapy including diuresis, bronchodilator therapy and airway clearing techniques.  ?Patient had high oxygen requirements with 100% Fi02 and 30 L per min per heated high flow nasal cannula along with salter non rebreather. ?Patient had recent hospitalization for respiratory failure, she was very weak and deconditioned. Poor oral intake.  ?No clinical signs of infection or acute flare of ILD.  ?Likely worsening pulmonary hypertension combined with poor respiratory reserve. ? ?Despite aggressive medical therapy patient continue to deteriorate.  ?Per her advance directives no invasive procedures were attempted,  she had a very poor prognosis, and was under hospice care at home.  ? ?Assessment and Plan: ?Goals of care, counseling/discussion ?DNR/DNI Goals of care are clear that CODE STATUS is DNR/DNI.  Okay to continue current plan of care with BiPAP, IV diuresis,  noninvasive measures.  If patient however deteriorates or exhibit signs of uncontrolled pain/agitation/anxiety then we will have low threshold for transition to comfort care measures. ? ? ? ? ?  ? ? ? ? ?The results of significant diagnostics from this hospitalization (including imaging, microbiology, ancillary and laboratory) are listed below for reference.  ? ?Significant Diagnostic Studies: ?CT Angio Chest PE W and/or Wo Contrast ? ?Result Date: 10/27/2021 ?CLINICAL DATA:  Hypoxia, positive D-dimer, evaluate for PE EXAM: CT ANGIOGRAPHY CHEST WITH CONTRAST TECHNIQUE: Multidetector CT imaging of the chest was performed using the standard protocol during bolus administration of intravenous contrast. Multiplanar CT image reconstructions and MIPs were obtained to evaluate the vascular anatomy. RADIATION DOSE REDUCTION: This exam was performed according to the departmental dose-optimization program which includes automated exposure control, adjustment of the mA and/or kV according to patient size and/or use of iterative reconstruction technique. CONTRAST:  36m OMNIPAQUE IOHEXOL 350 MG/ML SOLN COMPARISON:  Chest radiograph dated 10/14/2021. CT chest dated 09/25/2021. FINDINGS: Cardiovascular: Satisfactory opacification of the bilateral pulmonary arteries to the segmental level. No evidence of pulmonary embolism. Although not tailored for evaluation of the thoracic aorta, there is no evidence of thoracic aortic aneurysm or dissection. Atherosclerotic calcifications of the aortic arch. Cardiomegaly.  No pericardial effusion. Three vessel coronary atherosclerosis. Mediastinum/Nodes: No suspicious mediastinal lymphadenopathy. Visualized thyroid is unremarkable. Lungs/Pleura: Moderate bilateral pleural effusions. Associated lingular and bilateral lower lobe atelectasis. Calcified granuloma in the right middle lobe (series 7/image 35). Additional 4 mm subpleural nodule in the inferior right middle lobe (series 7/image 96),  unchanged. Mild centrilobular emphysematous changes, upper lung predominant. Faint ground-glass opacities with interlobular septal thickening in the bilateral upper lobes, suggesting mild interstitial edema. No pneumothorax. Upper Abdomen: Visualized upper abdomen is notable for scattered hepatic cysts and vascular calcifications. Musculoskeletal: Visualized osseous structures are within normal limits. Review of the MIP images confirms the above findings. IMPRESSION: No evidence of pulmonary embolism. Cardiomegaly with suspected mild interstitial edema with moderate bilateral pleural effusions. Associated lingular and bilateral lower lobe atelectasis. Aortic Atherosclerosis (ICD10-I70.0) and Emphysema (ICD10-J43.9). Electronically Signed   By: SJulian HyM.D.   On: 10/06/2021 20:25  ? ?DG Chest Port 1 View ? ?Result Date: 10/22/2021 ?CLINICAL DATA:  dyspnea EXAM: PORTABLE CHEST 1 VIEW COMPARISON:  Chest x-ray 09/24/2021, CT chest 09/14/2021 FINDINGS: The heart and mediastinal contours are unchanged. Aortic calcification. Prominent hilar vasculature. Biapical pleural/pulmonary scarring. Increased interstitial markings. Persistent bilateral lower lung zone patchy airspace opacities. Bilateral at least small pleural effusions. No pneumothorax. No acute osseous abnormality. IMPRESSION: 1. Persistent bilateral lower lung zone airspace opacities likely representing infection/inflammation with bilateral at least small volume pleural effusions. Superimposed pulmonary edema not excluded. Consider CT chest with intravenous contrast for further evaluation. 2. Aortic Atherosclerosis (ICD10-I70.0). Electronically Signed   By: MIven FinnM.D.   On: 10/03/2021 17:12   ? ?Microbiology: ?Recent Results (from the past 240 hour(s))  ?Resp Panel by RT-PCR (Flu A&B, Covid) Nasopharyngeal Swab     Status: None  ? Collection Time: 10/30/2021  5:25 PM  ? Specimen: Nasopharyngeal Swab; Nasopharyngeal(NP) swabs in vial transport  medium  ?Result Value Ref Range Status  ? SARS Coronavirus 2 by RT PCR NEGATIVE NEGATIVE Final  ?  Comment: (NOTE) ?SARS-CoV-2 target nucleic acids are NOT DETECTED. ? ?The SARS-CoV-2 RNA is generally detectable in

## 2021-11-03 NOTE — Progress Notes (Signed)
Pt. Time of death is 0530. Pt. Went asystole. Daughter notified and aware of pt. Status as well as MD. Orders given and carried out ?

## 2021-11-03 NOTE — Progress Notes (Unsigned)
? ?  Discharge Summary: [facility]  ?[facility] is the home-based palliative care program of [facility] ?Discharge Reason: Patient died in the hospital ?Admission Date: 10/16/2021 ?Discharge Date: 10/25/2021 ?Summary of [facility] Services: Khristy Mcnorton [facility] to [facility] with a diagnoses including Interstitual Lung Disease, Acute on Chronic Respiratory Failure with hypoxia, Pulmonary Hypertension,  Acute on Chronic Diastolic CHF, CAD, History of CVA, Anemia in CKD, HLD, Anxiety, Depression, Type 2 DM ?Nursing services were provided including goals of care discussions, teaching/assessment of medical conditions, medication teaching, coordination of care with medical providers and supportive counseling. As she began having increased O2 demands, goals of care discussion was held wtih patient and her 2 daughters. They were all aware of the severity of her illness and all confirmed wish for DNR. Patient's oxygen concentrator was not adequate to meet her oxygen needs. Due to concern about escalating symptoms, pateint and daughters elected to have her transferred to [facility] for evaluation and medication adjustments to improve symptom burden with a goal of returning home. Her condition continued to decline and patient died in the hosptial. ?Goals of Care: comfort care, symptom management and to remain in home if possible. ?Advance Directives: DNR and MOST ?Thanks for allowing us to participate with you in the care of this patient. ?Ahmani Daoud, RN, MSN, CHPN ?

## 2021-11-03 NOTE — Progress Notes (Addendum)
?  Discharge Summary: [facility]  ?[facility] is the home-based palliative care program of [facility] [place] ?Discharge Reason: Patient died in the hospital ?Admission Date: 10/16/2021 ?Discharge Date: November 09, 2021 ?Summary of [facility] Services: [name] was admitted to [facility] with a diagnoses including Interstitual Lung Disease, Acute on Chronic Respiratory Failure with hypoxia, Pulmonary Hypertension,  Acute on Chronic Diastolic CHF, CAD, History of CVA, Anemia in CKD, HLD, Anxiety, Depression, Type 2 DM ?Nursing services were provided including goals of care discussions, teaching/assessment of medical conditions, medication teaching, coordination of care with medical providers and supportive counseling. As she began having increased O2 demands, goals of care discussion was held wtih patient and her 2 daughters. They were all aware of the severity of her illness and all confirmed wish for DNR. Patient's oxygen concentrator was not adequate to meet her oxygen needs. Due to concern about escalating symptoms, pateint and daughters elected to have her transferred to [facility] for evaluation and medication adjustments to improve symptom burden with a goal of returning home. Her condition continued to decline and patient died in the hosptial. ?Goals of Care: comfort care, symptom management and to remain in home if possible. ?Advance Directives: DNR and MOST ? ?Thanks for allowing [place] to participate with you in the care of this patient. ?Jeanne Ivan, RN, MSN, [facility] ?

## 2021-11-03 NOTE — Progress Notes (Deleted)
? ?  Discharge Summary: [facility]  ?[facility] is the home-based palliative care program of [facility] [place] ?Discharge Reason: Patient died in the hospital ?Admission Date: 10/16/2021 ?Discharge Date: 11/03/2021 ?Summary of [facility] Services: [name] was admitted to [facility] [place] with a diagnoses including Interstitual Lung Disease, Acute on Chronic Respiratory Failure with hypoxia, Pulmonary Hypertension,  Acute on Chronic Diastolic CHF, CAD, History of CVA, Anemia in CKD, HLD, Anxiety, Depression, Type 2 DM ?Nursing services were provided including goals of care discussions, teaching/assessment of medical conditions, medication teaching, coordination of care with medical providers and supportive counseling. As she began having increased O2 demands, goals of care discussion was held wtih patient and her 2 daughters. They were all aware of the severity of her illness and all confirmed wish for DNR. Patient's oxygen concentrator was not adequate to meet her oxygen needs. Due to concern about escalating symptoms, pateint and daughters elected to have her transferred to [facility] for evaluation and medication adjustments to improve symptom burden with a goal of returning home. Her condition continued to decline and patient died in the hosptial. ?Goals of Care: comfort care, symptom management and to remain in home if possible. ?Advance Directives: DNR and MOST ?Thanks for allowing [place] to participate with you in the care of this patient. ?Jeanne Ivan, RN, MSN, [facility] ?

## 2021-11-03 DEATH — deceased

## 2021-11-26 ENCOUNTER — Encounter (HOSPITAL_COMMUNITY): Payer: Medicare Other | Admitting: Cardiology
